# Patient Record
Sex: Male | Born: 1955
Health system: Southern US, Community
[De-identification: ages and names within clinical notes are randomized; demographics above are authoritative.]

## PROBLEM LIST (undated history)

## (undated) DIAGNOSIS — R569 Unspecified convulsions: Secondary | ICD-10-CM

## (undated) DIAGNOSIS — Z95810 Presence of automatic (implantable) cardiac defibrillator: Secondary | ICD-10-CM

## (undated) DIAGNOSIS — M5090 Cervical disc disorder, unspecified, unspecified cervical region: Secondary | ICD-10-CM

## (undated) DIAGNOSIS — Z91010 Allergy to peanuts: Secondary | ICD-10-CM

## (undated) DIAGNOSIS — Z8719 Personal history of other diseases of the digestive system: Secondary | ICD-10-CM

## (undated) DIAGNOSIS — F329 Major depressive disorder, single episode, unspecified: Secondary | ICD-10-CM

## (undated) DIAGNOSIS — I499 Cardiac arrhythmia, unspecified: Secondary | ICD-10-CM

## (undated) DIAGNOSIS — I219 Acute myocardial infarction, unspecified: Secondary | ICD-10-CM

## (undated) DIAGNOSIS — I10 Essential (primary) hypertension: Secondary | ICD-10-CM

## (undated) DIAGNOSIS — R0600 Dyspnea, unspecified: Secondary | ICD-10-CM

## (undated) DIAGNOSIS — E119 Type 2 diabetes mellitus without complications: Secondary | ICD-10-CM

## (undated) DIAGNOSIS — F1910 Other psychoactive substance abuse, uncomplicated: Secondary | ICD-10-CM

## (undated) DIAGNOSIS — J438 Other emphysema: Secondary | ICD-10-CM

## (undated) DIAGNOSIS — H905 Unspecified sensorineural hearing loss: Secondary | ICD-10-CM

## (undated) DIAGNOSIS — I5042 Chronic combined systolic (congestive) and diastolic (congestive) heart failure: Secondary | ICD-10-CM

## (undated) DIAGNOSIS — F32A Depression, unspecified: Secondary | ICD-10-CM

## (undated) DIAGNOSIS — F419 Anxiety disorder, unspecified: Secondary | ICD-10-CM

## (undated) DIAGNOSIS — J479 Bronchiectasis, uncomplicated: Secondary | ICD-10-CM

## (undated) DIAGNOSIS — Z860101 Personal history of adenomatous and serrated colon polyps: Secondary | ICD-10-CM

## (undated) DIAGNOSIS — R7303 Prediabetes: Secondary | ICD-10-CM

## (undated) DIAGNOSIS — E854 Organ-limited amyloidosis: Secondary | ICD-10-CM

## (undated) DIAGNOSIS — E785 Hyperlipidemia, unspecified: Secondary | ICD-10-CM

## (undated) DIAGNOSIS — R7989 Other specified abnormal findings of blood chemistry: Secondary | ICD-10-CM

## (undated) DIAGNOSIS — J189 Pneumonia, unspecified organism: Secondary | ICD-10-CM

## (undated) DIAGNOSIS — I119 Hypertensive heart disease without heart failure: Secondary | ICD-10-CM

## (undated) DIAGNOSIS — D689 Coagulation defect, unspecified: Secondary | ICD-10-CM

## (undated) DIAGNOSIS — I2510 Atherosclerotic heart disease of native coronary artery without angina pectoris: Secondary | ICD-10-CM

## (undated) DIAGNOSIS — M160 Bilateral primary osteoarthritis of hip: Secondary | ICD-10-CM

## (undated) DIAGNOSIS — R739 Hyperglycemia, unspecified: Secondary | ICD-10-CM

## (undated) DIAGNOSIS — I428 Other cardiomyopathies: Secondary | ICD-10-CM

## (undated) DIAGNOSIS — Z86010 Personal history of colonic polyps: Secondary | ICD-10-CM

## (undated) HISTORY — DX: Personal history of adenomatous and serrated colon polyps: Z86.0101

## (undated) HISTORY — DX: Organ-limited amyloidosis: E85.4

## (undated) HISTORY — DX: Hyperglycemia, unspecified: R73.9

## (undated) HISTORY — DX: Bronchiectasis, uncomplicated: J47.9

## (undated) HISTORY — DX: Hyperlipidemia, unspecified: E78.5

## (undated) HISTORY — DX: Presence of automatic (implantable) cardiac defibrillator: Z95.810

## (undated) HISTORY — PX: OTHER SURGICAL HISTORY: SHX169

## (undated) HISTORY — DX: Bilateral primary osteoarthritis of hip: M16.0

## (undated) HISTORY — DX: Other emphysema: J43.8

## (undated) HISTORY — DX: Personal history of colon polyps: Z86.010

## (undated) HISTORY — DX: Coagulation defect, unspecified: D68.9

## (undated) HISTORY — DX: Personal history of other diseases of the digestive system: Z87.19

## (undated) HISTORY — DX: Allergy to peanuts: Z91.010

## (undated) HISTORY — DX: Atherosclerotic heart disease of native coronary artery without angina pectoris: I25.10

## (undated) HISTORY — DX: Unspecified sensorineural hearing loss: H90.5

## (undated) HISTORY — PX: COLONOSCOPY: SHX174

---

## 2002-03-18 ENCOUNTER — Emergency Department (HOSPITAL_COMMUNITY): Admission: EM | Admit: 2002-03-18 | Discharge: 2002-03-18 | Payer: Self-pay | Admitting: Emergency Medicine

## 2011-05-11 ENCOUNTER — Emergency Department (HOSPITAL_COMMUNITY): Payer: Self-pay

## 2011-05-11 ENCOUNTER — Emergency Department (HOSPITAL_COMMUNITY)
Admission: EM | Admit: 2011-05-11 | Discharge: 2011-05-11 | Disposition: A | Discharge status: Home / Self Care | Payer: Self-pay | Attending: Emergency Medicine | Admitting: Emergency Medicine

## 2011-05-11 DIAGNOSIS — R51 Headache: Secondary | ICD-10-CM | POA: Insufficient documentation

## 2011-05-11 DIAGNOSIS — M542 Cervicalgia: Secondary | ICD-10-CM | POA: Insufficient documentation

## 2011-05-11 DIAGNOSIS — M546 Pain in thoracic spine: Secondary | ICD-10-CM | POA: Insufficient documentation

## 2011-05-11 DIAGNOSIS — R9431 Abnormal electrocardiogram [ECG] [EKG]: Secondary | ICD-10-CM

## 2011-05-11 DIAGNOSIS — S2691XA Contusion of heart, unspecified with or without hemopericardium, initial encounter: Secondary | ICD-10-CM | POA: Insufficient documentation

## 2011-05-11 DIAGNOSIS — I10 Essential (primary) hypertension: Secondary | ICD-10-CM | POA: Insufficient documentation

## 2011-05-11 DIAGNOSIS — IMO0001 Reserved for inherently not codable concepts without codable children: Secondary | ICD-10-CM | POA: Insufficient documentation

## 2011-05-11 DIAGNOSIS — R209 Unspecified disturbances of skin sensation: Secondary | ICD-10-CM | POA: Insufficient documentation

## 2011-05-11 DIAGNOSIS — M25519 Pain in unspecified shoulder: Secondary | ICD-10-CM | POA: Insufficient documentation

## 2011-05-11 DIAGNOSIS — R079 Chest pain, unspecified: Secondary | ICD-10-CM | POA: Insufficient documentation

## 2011-05-11 LAB — POCT I-STAT, CHEM 8
Chloride: 101 mEq/L (ref 96–112)
HCT: 44 % (ref 39.0–52.0)
Hemoglobin: 15 g/dL (ref 13.0–17.0)
Potassium: 3.5 mEq/L (ref 3.5–5.1)
Sodium: 136 mEq/L (ref 135–145)

## 2011-05-11 LAB — POCT I-STAT TROPONIN I
Troponin i, poc: 0.01 ng/mL (ref 0.00–0.08)
Troponin i, poc: 0.02 ng/mL (ref 0.00–0.08)
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
## 2012-10-22 ENCOUNTER — Emergency Department (HOSPITAL_COMMUNITY): Payer: Self-pay

## 2012-10-22 ENCOUNTER — Encounter (HOSPITAL_COMMUNITY): Payer: Self-pay | Admitting: *Deleted

## 2012-10-22 ENCOUNTER — Emergency Department (HOSPITAL_COMMUNITY)
Admission: EM | Admit: 2012-10-22 | Discharge: 2012-10-22 | Disposition: A | Discharge status: Home / Self Care | Payer: Self-pay | Attending: Emergency Medicine | Admitting: Emergency Medicine

## 2012-10-22 DIAGNOSIS — R51 Headache: Secondary | ICD-10-CM | POA: Insufficient documentation

## 2012-10-22 DIAGNOSIS — R071 Chest pain on breathing: Secondary | ICD-10-CM | POA: Insufficient documentation

## 2012-10-22 DIAGNOSIS — I10 Essential (primary) hypertension: Secondary | ICD-10-CM | POA: Insufficient documentation

## 2012-10-22 DIAGNOSIS — R1110 Vomiting, unspecified: Secondary | ICD-10-CM | POA: Insufficient documentation

## 2012-10-22 DIAGNOSIS — Z79899 Other long term (current) drug therapy: Secondary | ICD-10-CM | POA: Insufficient documentation

## 2012-10-22 DIAGNOSIS — R0789 Other chest pain: Secondary | ICD-10-CM

## 2012-10-22 HISTORY — DX: Essential (primary) hypertension: I10

## 2012-10-22 LAB — CBC WITH DIFFERENTIAL/PLATELET
Basophils Relative: 0 % (ref 0–1)
Eosinophils Absolute: 0.1 10*3/uL (ref 0.0–0.7)
Eosinophils Relative: 1 % (ref 0–5)
Lymphs Abs: 1.2 10*3/uL (ref 0.7–4.0)
MCH: 28.6 pg (ref 26.0–34.0)
MCHC: 34.2 g/dL (ref 30.0–36.0)
MCV: 83.7 fL (ref 78.0–100.0)
Neutrophils Relative %: 75 % (ref 43–77)
Platelets: 162 10*3/uL (ref 150–400)

## 2012-10-22 LAB — COMPREHENSIVE METABOLIC PANEL
ALT: 21 U/L (ref 0–53)
Albumin: 3.2 g/dL — ABNORMAL LOW (ref 3.5–5.2)
Alkaline Phosphatase: 53 U/L (ref 39–117)
BUN: 12 mg/dL (ref 6–23)
Calcium: 8.5 mg/dL (ref 8.4–10.5)
GFR calc Af Amer: >90 mL/min (ref >90)
Glucose, Bld: 108 mg/dL — ABNORMAL HIGH (ref 70–99)
Potassium: 3.5 mEq/L (ref 3.5–5.1)
Sodium: 133 mEq/L — ABNORMAL LOW (ref 135–145)
Total Protein: 6.2 g/dL (ref 6.0–8.3)

## 2012-10-22 MED ORDER — DIPHENHYDRAMINE HCL 50 MG/ML IJ SOLN
25.0000 mg | Freq: Once | INTRAMUSCULAR | Status: AC
  Administered 2012-10-22: 25 mg via INTRAVENOUS
  Filled 2012-10-22: qty 1

## 2012-10-22 MED ORDER — DIAZEPAM 5 MG/ML IJ SOLN
5.0000 mg | Freq: Once | INTRAMUSCULAR | Status: AC
  Administered 2012-10-22: 5 mg via INTRAVENOUS
  Filled 2012-10-22: qty 2

## 2012-10-22 MED ORDER — METOCLOPRAMIDE HCL 5 MG/ML IJ SOLN
10.0000 mg | Freq: Once | INTRAMUSCULAR | Status: AC
  Administered 2012-10-22: 10 mg via INTRAVENOUS
  Filled 2012-10-22: qty 2

## 2012-10-22 MED ORDER — SODIUM CHLORIDE 0.9 % IV SOLN: Freq: Once | INTRAVENOUS | Status: DC

## 2012-10-22 MED ORDER — ASPIRIN 81 MG PO CHEW
324.0000 mg | CHEWABLE_TABLET | Freq: Once | ORAL | Status: AC
  Administered 2012-10-22: 324 mg via ORAL
  Filled 2012-10-22: qty 4

## 2012-10-22 NOTE — ED Notes (Signed)
Equal grips bilaterally; equal facial grimacing; no arm drift; no numbness

## 2012-10-22 NOTE — ED Provider Notes (Signed)
History     CSN: [id number]  Arrival date & time 10/22/12  0103   First MD Initiated Contact with Patient 10/22/12 0122      Chief Complaint  Patient presents with  . Chest Pain  . Headache    (Consider location/radiation/quality/duration/timing/severity/associated sxs/prior treatment) HPI 57 year old male presents to emergency room complaining of 3 days of frontal headache, elevated blood pressures and onset of right-sided chest pain this evening. Patient reports pain in his head came on gradually after a night shift. He is tried Alka-Seltzer plus and ibuprofen without improvement in symptoms. He has been able to sleep secondary to pain. He had vomiting just prior to arrival here. He denies any focal weakness numbness or loss of function. Patient has history of migraines, but reports this headache is not like his migraines. He denies any photo or phonophobia associated with this headache. Patient is very tender to palpation over her front of his head. Patient has history of hypertension, has been off his medications for at least a year. He actually has an appointment with his primary care Dr. this Friday to restart his medications. Patient reports his sister had a stroke, but denies any other significant family history. Chest pain started just prior to arrival. It is right-sided. He denies any diaphoresis, shortness of breath. Pain is worse with palpation to the right chest. No fever no cough.  Past Medical History  Diagnosis Date  . Hypertension     History reviewed. No pertinent past surgical history.  No family history on file.  History  Substance Use Topics  . Smoking status: Never Smoker   . Smokeless tobacco: Not on file  . Alcohol Use: No      Review of Systems  See History of Present Illness; otherwise all other systems are reviewed and negative  Allergies  Penicillins  Home Medications   Current Outpatient Rx  Name  Route  Sig  Dispense  Refill  . IBUPROFEN  200 MG PO TABS   Oral   Take 200 mg by mouth every 6 (six) hours as needed. Pain         . PHENYLEPH-DOXYLAMINE-DM-APAP 5-6.25-10-325 MG PO CAPS   Oral   Take 1 tablet by mouth every 6 (six) hours as needed. Cold symptoms           BP 148/84  Pulse 86  Temp 97.8 F (36.6 C)  Resp 20  SpO2 99%  Physical Exam  Nursing note and vitals reviewed. Constitutional: He is oriented to person, place, and time. He appears well-developed and well-nourished.  HENT:  Head: Normocephalic and atraumatic.  Right Ear: External ear normal.  Left Ear: External ear normal.  Nose: Nose normal.  Mouth/Throat: Oropharynx is clear and moist.       Patient is exquisitely tender with light and deep palpation of the for head and frontal scalp  Eyes: Conjunctivae normal and EOM are normal. Pupils are equal, round, and reactive to light.  Neck: Normal range of motion. Neck supple. No JVD present. No tracheal deviation present. No thyromegaly present.  Cardiovascular: Normal rate, regular rhythm, normal heart sounds and intact distal pulses.  Exam reveals no gallop and no friction rub.   No murmur heard. Pulmonary/Chest: Effort normal and breath sounds normal. No stridor. No respiratory distress. He has no wheezes. He has no rales. He exhibits tenderness (patient is tender over the right pectoral muscle. Palpation of this area reproduces his chest pain).  Abdominal: Soft. Bowel sounds are normal. He  exhibits no distension and no mass. There is no tenderness. There is no rebound and no guarding.  Musculoskeletal: Normal range of motion. He exhibits no edema and no tenderness.  Lymphadenopathy:    He has no cervical adenopathy.  Neurological: He is alert and oriented to person, place, and time. He has normal reflexes. No cranial nerve deficit. He exhibits normal muscle tone. Coordination normal.  Skin: Skin is warm and dry. No rash noted. No erythema. No pallor.  Psychiatric: He has a normal mood and affect.  His behavior is normal. Judgment and thought content normal.    ED Course  Procedures (including critical care time)  Labs Reviewed  COMPREHENSIVE METABOLIC PANEL - Abnormal; Notable for the following:    Sodium 133 (*)     Glucose, Bld 108 (*)     Albumin 3.2 (*)     Total Bilirubin 0.2 (*)     All other components within normal limits  TROPONIN I  CBC WITH DIFFERENTIAL   Dg Chest 2 View  10/22/2012  *RADIOLOGY REPORT*  Clinical Data: Chest pain and headache.  CHEST - 2 VIEW  Comparison: Chest radiograph performed 05/11/2011  Findings: The lungs are hypoexpanded.  Mild bibasilar airspace opacities may reflect atelectasis or possibly pneumonia.  There is no evidence of pleural effusion or pneumothorax.  The heart is mildly enlarged.  No acute osseous abnormalities are seen.  IMPRESSION: Mild bibasilar airspace opacities may reflect atelectasis or possibly pneumonia.  Mild cardiomegaly noted.   Original Report Authenticated By: Tonia Ghent, M.D.     Date: 10/22/2012  Rate: 78  Rhythm: normal sinus rhythm  QRS Axis: normal  Intervals: normal  ST/T Wave abnormalities: nonspecific T wave changes and early repolarization  Conduction Disutrbances:none  Narrative Interpretation: T. wave inversions laterally are new from prior  Old EKG Reviewed: changes noted    1. Headache   2. Chest wall pain   3. Hypertension       MDM   57 year old male with 3 days of headache, now with chest pain. Headache appears to be tension in nature. His narrow exam is negative. Headache was not thunderclap in nature. I do not feel that he has a subarachnoid or meningitis. Chest pain seems atypical, and musculoskeletal. He does have new changes on his EKG from prior EKG from 2 years ago. Will check baseline labs, head CT and chest x-ray and will treat pain.  Pt with complete resolution of headache, improved bp.  Labs unremarkable, has close f/u with pcm.       Olivia Mackie, MD 10/22/12 [phone]

## 2012-10-22 NOTE — ED Notes (Signed)
Pt c/o vomiting/headache x 3 days; chest pain x 1 hour; headache constant x 3 days

## 2013-08-10 ENCOUNTER — Emergency Department (INDEPENDENT_AMBULATORY_CARE_PROVIDER_SITE_OTHER)
Admission: EM | Admit: 2013-08-10 | Discharge: 2013-08-10 | Disposition: A | Discharge status: Home / Self Care | Payer: Self-pay | Source: Home / Self Care

## 2013-08-10 ENCOUNTER — Encounter (HOSPITAL_COMMUNITY): Payer: Self-pay | Admitting: Emergency Medicine

## 2013-08-10 DIAGNOSIS — H109 Unspecified conjunctivitis: Secondary | ICD-10-CM

## 2013-08-10 MED ORDER — KETOTIFEN FUMARATE 0.025 % OP SOLN: 1.0000 [drp] | Freq: Two times a day (BID) | OPHTHALMIC | Status: DC

## 2013-08-10 MED ORDER — POLYMYXIN B-TRIMETHOPRIM 10000-0.1 UNIT/ML-% OP SOLN: 1.0000 [drp] | OPHTHALMIC | Status: DC

## 2013-08-10 NOTE — ED Provider Notes (Signed)
CSN: [id number]     Arrival date & time 08/10/13  1437 History   First MD Initiated Contact with Patient 08/10/13 1556     Chief Complaint  Patient presents with  . Eye Problem   (Consider location/radiation/quality/duration/timing/severity/associated sxs/prior Treatment) HPI Comments: C/O mild red and itchy eyes for several days. 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 St only occassional clear tearing.    Past Medical History  Diagnosis Date  . Hypertension    History reviewed. No pertinent past surgical history. No family history on file. History  Substance Use Topics  . Smoking status: Never Smoker   . Smokeless tobacco: Not on file  . Alcohol Use: No    Review of Systems  Eyes: Positive for redness and itching. Negative for photophobia, pain and visual disturbance.  All other systems reviewed and are negative.    Allergies  Penicillins  Home Medications   Current Outpatient Rx  Name  Route  Sig  Dispense  Refill  . ibuprofen (ADVIL,MOTRIN) 200 MG tablet   Oral   Take 200 mg by mouth every 6 (six) hours as needed. Pain         . Phenyleph-Doxylamine-DM-APAP (ALKA-SELTZER PLS NIGHT CLD/FLU) 5-6.25-10-325 MG CAPS   Oral   Take 1 tablet by mouth every 6 (six) hours as needed. Cold symptoms          BP 108/78  Pulse 101  Temp(Src) 98.1 F (36.7 C) (Oral)  Resp 16 Physical Exam  Nursing note and vitals reviewed. Constitutional: He is oriented to person, place, and time. He appears well-developed and well-nourished. No distress.  Eyes: EOM are normal. Pupils are equal, round, and reactive to light.  Minor bilateral conjunctival erythema, no swelling/edema. No current drainage. Minimal scleral redness.   Neurological: He is alert and oriented to person, place, and time. He exhibits normal muscle tone.  Skin: Skin is warm and dry.  Psychiatric: He has a normal mood and affect.    ED Course  Procedures (including critical care time) Labs Review Labs  Reviewed - No data to display Imaging Review No results found.      MDM   1. Conjunctivitis of both eyes      polytrim as directed and Zaditor  Hayden Rasmussen, NP 08/10/13 1623

## 2013-08-10 NOTE — ED Notes (Signed)
Reports left eye red and work place requesting a note concerning dignosis of left eye.  Both eyes appear red.  Patient reports eyes itching and watery.  Patient works 2 jobs.

## 2013-08-12 NOTE — ED Provider Notes (Signed)
Medical screening examination/treatment/procedure(s) were performed by resident physician or non-physician practitioner and as supervising physician I was immediately available for consultation/collaboration.   KINDL,JAMES DOUGLAS MD.   James D Kindl, MD 08/12/13 1918 

## 2014-09-27 ENCOUNTER — Encounter: Payer: Self-pay | Admitting: Internal Medicine

## 2014-09-27 ENCOUNTER — Ambulatory Visit: Payer: No Typology Code available for payment source | Attending: Internal Medicine | Admitting: Internal Medicine

## 2014-09-27 VITALS — BP 129/82 | HR 94 | Temp 97.7°F | Resp 16 | Ht 66.0 in | Wt 139.0 lb

## 2014-09-27 DIAGNOSIS — Z79899 Other long term (current) drug therapy: Secondary | ICD-10-CM | POA: Insufficient documentation

## 2014-09-27 DIAGNOSIS — M79604 Pain in right leg: Secondary | ICD-10-CM

## 2014-09-27 DIAGNOSIS — M79651 Pain in right thigh: Secondary | ICD-10-CM | POA: Diagnosis not present

## 2014-09-27 DIAGNOSIS — Z0000 Encounter for general adult medical examination without abnormal findings: Secondary | ICD-10-CM | POA: Diagnosis present

## 2014-09-27 DIAGNOSIS — Z1211 Encounter for screening for malignant neoplasm of colon: Secondary | ICD-10-CM

## 2014-09-27 DIAGNOSIS — B070 Plantar wart: Secondary | ICD-10-CM | POA: Insufficient documentation

## 2014-09-27 DIAGNOSIS — I10 Essential (primary) hypertension: Secondary | ICD-10-CM | POA: Diagnosis not present

## 2014-09-27 MED ORDER — TRAMADOL HCL 50 MG PO TABS: 50.0000 mg | ORAL_TABLET | Freq: Two times a day (BID) | ORAL | Status: DC | PRN

## 2014-09-27 MED ORDER — MELOXICAM 7.5 MG PO TABS: 7.5000 mg | ORAL_TABLET | Freq: Every day | ORAL | Status: DC

## 2014-09-27 NOTE — Progress Notes (Signed)
Patient ID: Jonathan Dickson, male   DOB: December 30, 1955, 58 y.o.   MRN: [id number]   IWP:[id number]  NKN:[id number]  DOB - 16-Feb-1956  CC:  Chief Complaint  Patient presents with  . Establish Care       HPI: Jonathan Dickson is a 58 y.o. male here today to establish medical care.  Patient has a past medical history of hypertension.  Patient is currently on lisinopril 20 mg daily.  He reports that he has been having severe right thigh pain for the past one year.  He reports that the pain in only located on the lateral middle thigh and feels like a sharp knife stabbing.  He denies any injuries to the leg. Denies back pain.  He has tried tylenol, BC powder, and OTC pain aides.  Was previously being seen by Jinny Blossom for chronic disease management.   Patient reports that he had a Therapist, sports from his insurance company to come out and look at his feet and he was told that he had bilateral plantar warts.  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  Need colonoscopy.     Patient has No headache, No chest pain, No abdominal pain - No Nausea, No new weakness tingling or numbness, No Cough - SOB.  Allergies  Allergen Reactions  . Peanut-Containing Drug Products   . Penicillins    Past Medical History  Diagnosis Date  . Hypertension    Current Outpatient Prescriptions on File Prior to Visit  Medication Sig Dispense Refill  . ibuprofen (ADVIL,MOTRIN) 200 MG tablet Take 200 mg by mouth every 6 (six) hours as needed. Pain    . ketotifen (ZADITOR) 0.025 % ophthalmic solution Place 1 drop into both eyes 2 (two) times daily. (Patient not taking: Reported on 09/27/2014) 5 mL 0  . Phenyleph-Doxylamine-DM-APAP (ALKA-SELTZER PLS NIGHT CLD/FLU) 5-6.25-10-325 MG CAPS Take 1 tablet by mouth every 6 (six) hours as needed. Cold symptoms    . trimethoprim-polymyxin b (POLYTRIM) ophthalmic solution Place 1 drop into both eyes every 4 (four) hours. (Patient not taking: Reported on 09/27/2014) 10 mL 0   No current  facility-administered medications on file prior to visit.   History reviewed. No pertinent family history. History   Social History  . Marital Status: Legally Separated    Spouse Name: N/A    Number of Children: N/A  . Years of Education: N/A   Occupational History  . Not on file.   Social History Main Topics  . Smoking status: Never Smoker   . Smokeless tobacco: Not on file  . Alcohol Use: No  . Drug Use: Not on file  . Sexual Activity: Not on file   Other Topics Concern  . Not on file   Social History Narrative    Review of Systems: See HPI    Objective:   Filed Vitals:   09/27/14 1613  BP: 129/82  Pulse: 94  Temp: 97.7 F (36.5 C)  Resp: 16    Physical Exam: Constitutional: Patient appears well-developed and well-nourished. No distress. Eyes: Conjunctivae and EOM are normal. PERRLA, no scleral icterus. Neck: Normal ROM. Neck supple. No JVD. No tracheal deviation. No thyromegaly. CVS: RRR, S1/S2 +, no murmurs, no gallops, no carotid bruit.  Pulmonary: Effort and breath sounds normal, no stridor, rhonchi, wheezes, rales.  Abdominal: Soft. BS +, no distension, tenderness, rebound or guarding.  Musculoskeletal: Normal range of motion. No edema and no tenderness.  Lymphadenopathy: No lymphadenopathy noted, cervical Neuro: Alert.  Skin: Skin is warm and dry. No rash noted. Not diaphoretic. No erythema. No pallor. Small callus areas on bilateral ball of feet Psychiatric: Normal mood and affect. Behavior, judgment, thought content normal.  Lab Results  Component Value Date   WBC 6.3 10/22/2012   HGB 13.7 10/22/2012   HCT 40.1 10/22/2012   MCV 83.7 10/22/2012   PLT 162 10/22/2012   Lab Results  Component Value Date   CREATININE 0.67 10/22/2012   BUN 12 10/22/2012   NA 133* 10/22/2012   K 3.5 10/22/2012   CL 99 10/22/2012   CO2 24 10/22/2012    No results found for: HGBA1C Lipid Panel  No results found for: CHOL, TRIG, HDL, CHOLHDL, VLDL,  LDLCALC     Assessment and plan:   Jonathan Dickson was seen today for establish care.  Diagnoses and associated orders for this visit:  Essential hypertension - CBC; Future - COMPLETE METABOLIC PANEL WITH GFR; Future - Lipid panel; Future Patient blood pressure is stable and may continue on current medication.  Education on diet, exercise, and modifiable risk factors discussed. Will obtain appropriate labs as needed. Will follow up in 3-6 months.   Right leg pain - meloxicam (MOBIC) 7.5 MG tablet; Take 1 tablet (7.5 mg total) by mouth daily. - traMADol (ULTRAM) 50 MG tablet; Take 1 tablet (50 mg total) by mouth every 12 (twelve) hours as needed.  Plantar warts - Ambulatory referral to Podiatry  Colon cancer screening - [facility] COLONOSCOPY   Return in about 1 week (around 10/04/2014) for Lab Visit and 3 mo PCP HTN.      Chari Manning, [facility] [phone] 09/27/2014, 4:34 PM

## 2014-09-27 NOTE — Progress Notes (Signed)
Pt is here to establish care. Pt has a history of HTN. Pt states that for a year he has severe pain in his right thigh. Pt reports having plantar warts on b/l feet.

## 2014-09-27 NOTE — Patient Instructions (Signed)
Plantar Warts Warts are benign (noncancerous) growths of the outer skin layer. They can occur at any time in life but are most common during childhood and the teen years. Warts can occur on many skin surfaces of the body. When they occur on the underside (sole) of your foot they are called plantar warts. They often emerge in groups with several small warts encircling a larger growth. CAUSES  Human papillomavirus (HPV) is the cause of plantar warts. HPV attacks a break in the skin of the foot. Walking barefoot can lead to exposure to the wart virus. Plantar warts tend to develop over areas of pressure such as the heel and ball of the foot. Plantar warts often grow into the deeper layers of skin. They may spread to other areas of the sole but cannot spread to other areas of the body. SYMPTOMS  You may also notice a growth on the undersurface of your foot. The wart may grow directly into the sole of the foot, or rise above the surface of the skin on the sole of the foot, or both. They are most often flat from pressure. Warts generally do not cause itching but may cause pain in the area of the wart when you put weight on your foot. DIAGNOSIS  Diagnosis is made by physical examination. This means your caregiver discovers it while examining your foot.  TREATMENT  There are many ways to treat plantar warts. However, warts are very tough. Sometimes it is difficult to treat them so that they go away completely and do not grow back. Any treatment must be done regularly to work. If left untreated, most plantar warts will eventually disappear over a period of one to two years. Treatments you can do at home include:  Putting duct tape over the top of the wart (occlusion) has been found to be effective over several months. The duct tape should be removed each night and reapplied until the wart has disappeared.  Placing over-the-counter medications on top of the wart to help kill the wart virus and remove the wart  tissue (salicylic acid, cantharidin, and dichloroacetic acid) are useful. These are called keratolytic agents. These medications make the skin soft and gradually layers will shed away. These compounds are usually placed on the wart each night and then covered with a bandage. They are also available in premedicated bandage form. Avoid surrounding skin when applying these liquids as these medications can burn healthy skin. The treatment may take several months of nightly use to be effective.  Cryotherapy to freeze the wart has recently become available over-the-counter for children 4 years and older. This system makes use of a soft narrow applicator connected to a bottle of compressed cold liquid that is applied directly to the wart. This medication can burn healthy skin and should be used with caution.  As with all over-the-counter medications, read the directions carefully before use. Treatments generally done in your caregiver's office include:  Some aggressive treatments may cause discomfort, discoloration, and scarring of the surrounding skin. The risks and benefits of treatment should be discussed with your caregiver.  Freezing the wart with liquid nitrogen (cryotherapy, see above).  Burning the wart with use of very high heat (cautery).  Injecting medication into the wart.  Surgically removing or laser treatment of the wart.  Your caregiver may refer you to a dermatologist for difficult to treat large-sized warts or large numbers of warts. HOME CARE INSTRUCTIONS   Soak the affected area in warm water. Dry the   area completely when you are done. Remove the top layer of softened skin, then apply the chosen topical medication and reapply a bandage.  Remove the bandage daily and file excess wart tissue (pumice stone works well for this purpose). Repeat the entire process daily or every other day for weeks until the plantar wart disappears.  Several brands of salicylic acid pads are available  as over-the-counter remedies.  Pain can be relieved by wearing a donut bandage. This is a bandage with a hole in it. The bandage is put on with the hole over the wart. This helps take the pressure off the wart and gives pain relief. To help prevent plantar warts:  Wear shoes and socks and change them daily.  Keep feet clean and dry.  Check your feet and your children's feet regularly.  Avoid direct contact with warts on other people.  Have growths or changes on your skin checked by your caregiver. Document Released: 12/07/2003 Document Revised: 01/31/2014 Document Reviewed: 05/17/2009 Exit[employer] Patient Information 2015 [employer]. This information is not intended to replace advice given to you by your health care provider. Make sure you discuss any questions you have with your health care provider.  

## 2014-10-04 ENCOUNTER — Ambulatory Visit: Payer: No Typology Code available for payment source | Attending: Internal Medicine

## 2014-10-04 DIAGNOSIS — I10 Essential (primary) hypertension: Secondary | ICD-10-CM

## 2014-10-04 LAB — COMPLETE METABOLIC PANEL WITH GFR
ALT: 16 U/L (ref 0–53)
AST: 24 U/L (ref 0–37)
Albumin: 4.2 g/dL (ref 3.5–5.2)
Alkaline Phosphatase: 51 U/L (ref 39–117)
BUN: 18 mg/dL (ref 6–23)
CO2: 27 mEq/L (ref 19–32)
Calcium: 9.8 mg/dL (ref 8.4–10.5)
Chloride: 101 mEq/L (ref 96–112)
Creat: 0.91 mg/dL (ref 0.50–1.35)
GFR, Est African American: >89 mL/min
GFR, Est Non African American: >89 mL/min
Glucose, Bld: 90 mg/dL (ref 70–99)
Potassium: 4.2 mEq/L (ref 3.5–5.3)
Sodium: 136 mEq/L (ref 135–145)
Total Bilirubin: 0.5 mg/dL (ref 0.2–1.2)
Total Protein: 6.8 g/dL (ref 6.0–8.3)

## 2014-10-04 LAB — LIPID PANEL
Cholesterol: 155 mg/dL (ref 0–200)
HDL: 54 mg/dL (ref >39)
LDL Cholesterol: 84 mg/dL (ref 0–99)
Total CHOL/HDL Ratio: 2.9 Ratio
Triglycerides: 87 mg/dL (ref <150)
VLDL: 17 mg/dL (ref 0–40)

## 2014-10-04 LAB — CBC
HCT: 43.2 % (ref 39.0–52.0)
Hemoglobin: 15 g/dL (ref 13.0–17.0)
MCH: 28.5 pg (ref 26.0–34.0)
MCHC: 34.7 g/dL (ref 30.0–36.0)
MCV: 82 fL (ref 78.0–100.0)
MPV: 11.2 fL (ref 8.6–12.4)
Platelets: 224 10*3/uL (ref 150–400)
RBC: 5.27 MIL/uL (ref 4.22–5.81)
RDW: 13.5 % (ref 11.5–15.5)
WBC: 6.2 10*3/uL (ref 4.0–10.5)

## 2014-10-10 ENCOUNTER — Telehealth: Payer: Self-pay | Admitting: *Deleted

## 2014-10-10 NOTE — Telephone Encounter (Signed)
Pt aware of lab results Stated eat peanut and lips swelling, took benadryl this morning and lips still swelling Advised to go to ER if short of breath or Sx worsen Come to walking clinic tomorrow form [facility] if no changes

## 2014-10-10 NOTE — Telephone Encounter (Signed)
-----   Message from Lance Bosch, NP sent at 10/07/2014  9:20 AM EST ----- All labs are normal

## 2014-10-11 ENCOUNTER — Telehealth: Payer: Self-pay | Admitting: *Deleted

## 2014-10-11 NOTE — Telephone Encounter (Signed)
Called patient to check how he is feeling Stated swelling gone down and still taking Benadryl.

## 2014-10-20 ENCOUNTER — Ambulatory Visit: Payer: No Typology Code available for payment source | Admitting: Podiatry

## 2014-10-28 ENCOUNTER — Other Ambulatory Visit: Payer: Self-pay | Admitting: Internal Medicine

## 2014-11-01 ENCOUNTER — Ambulatory Visit: Payer: No Typology Code available for payment source | Admitting: Podiatry

## 2014-11-15 ENCOUNTER — Ambulatory Visit (INDEPENDENT_AMBULATORY_CARE_PROVIDER_SITE_OTHER): Payer: No Typology Code available for payment source | Admitting: Podiatry

## 2014-11-15 VITALS — BP 109/9 | HR 99 | Resp 16 | Ht 66.0 in | Wt 142.0 lb

## 2014-11-15 DIAGNOSIS — Q828 Other specified congenital malformations of skin: Secondary | ICD-10-CM

## 2014-11-15 NOTE — Progress Notes (Signed)
   Subjective:    Patient ID: Jonathan Dickson, male    DOB: 02/27/1956, 59 y.o.   MRN: [id number]  HPI Comments: Both feet callused lesions, been there for years, no treatment, picked at them with fingernail      Review of Systems  All other systems reviewed and are negative.      Objective:   Physical Exam: I have reviewed his past mental history medications allergies surgery social history and review of systems. Pulses are strongly palpable bilateral. Neurologic sensorium is intact percent was C monofilament. Deep tendon reflexes are intact bilaterally muscle strength is 5 over 5 dorsiflexion plantar flexors and inverters everters all intrinsic musculature is intact. With a peak evaluation of Mr. it's all joints distal to the ankle for range of motion without crepitus. Solitary poor keratomas the plantar aspect of the bilateral foot total of 3 on the right foot 1 on the left foot.        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  Plan: Excision of soft tissue lesions today and I will follow-up with him as needed.

## 2015-01-27 ENCOUNTER — Encounter (HOSPITAL_COMMUNITY): Payer: Self-pay | Admitting: Emergency Medicine

## 2015-01-27 DIAGNOSIS — Z791 Long term (current) use of non-steroidal anti-inflammatories (NSAID): Secondary | ICD-10-CM | POA: Insufficient documentation

## 2015-01-27 DIAGNOSIS — M62838 Other muscle spasm: Secondary | ICD-10-CM | POA: Diagnosis not present

## 2015-01-27 DIAGNOSIS — M5410 Radiculopathy, site unspecified: Secondary | ICD-10-CM | POA: Insufficient documentation

## 2015-01-27 DIAGNOSIS — Z79899 Other long term (current) drug therapy: Secondary | ICD-10-CM | POA: Insufficient documentation

## 2015-01-27 DIAGNOSIS — M79601 Pain in right arm: Secondary | ICD-10-CM | POA: Diagnosis present

## 2015-01-27 DIAGNOSIS — Z880 Allergy status to penicillin: Secondary | ICD-10-CM | POA: Insufficient documentation

## 2015-01-27 DIAGNOSIS — I10 Essential (primary) hypertension: Secondary | ICD-10-CM | POA: Insufficient documentation

## 2015-01-27 DIAGNOSIS — Z87891 Personal history of nicotine dependence: Secondary | ICD-10-CM | POA: Insufficient documentation

## 2015-01-27 LAB — I-STAT TROPONIN, ED: TROPONIN I, POC: 0.01 ng/mL (ref 0.00–0.08)

## 2015-01-27 LAB — CBC
HEMATOCRIT: 41.3 % (ref 39.0–52.0)
Hemoglobin: 14.3 g/dL (ref 13.0–17.0)
MCH: 29.2 pg (ref 26.0–34.0)
MCHC: 34.6 g/dL (ref 30.0–36.0)
MCV: 84.3 fL (ref 78.0–100.0)
PLATELETS: 170 10*3/uL (ref 150–400)
RBC: 4.9 MIL/uL (ref 4.22–5.81)
RDW: 12.8 % (ref 11.5–15.5)
WBC: 5.5 10*3/uL (ref 4.0–10.5)

## 2015-01-27 NOTE — ED Notes (Signed)
Intermittently feeling of electricity going through R arm x 3 days and then followed by numbness to RUE.  Also reports intermittent R sided neck pain and pain to upper R back/shoulderblade.    Denies sob, nausea, vomiting, chest pain.  No neuro deficits noted on triage exam.

## 2015-01-28 ENCOUNTER — Emergency Department (HOSPITAL_COMMUNITY)
Admission: EM | Admit: 2015-01-28 | Discharge: 2015-01-28 | Disposition: A | Discharge status: Home / Self Care | Payer: No Typology Code available for payment source | Attending: Emergency Medicine | Admitting: Emergency Medicine

## 2015-01-28 DIAGNOSIS — M5410 Radiculopathy, site unspecified: Secondary | ICD-10-CM

## 2015-01-28 DIAGNOSIS — M79604 Pain in right leg: Secondary | ICD-10-CM

## 2015-01-28 DIAGNOSIS — M62838 Other muscle spasm: Secondary | ICD-10-CM

## 2015-01-28 LAB — BASIC METABOLIC PANEL
Anion gap: 8 (ref 5–15)
BUN: 16 mg/dL (ref 6–23)
CO2: 24 mmol/L (ref 19–32)
Calcium: 9.1 mg/dL (ref 8.4–10.5)
Chloride: 103 mmol/L (ref 96–112)
Creatinine, Ser: 1.02 mg/dL (ref 0.50–1.35)
GFR calc non Af Amer: 79 mL/min — ABNORMAL LOW (ref >90)
GLUCOSE: 100 mg/dL — AB (ref 70–99)
POTASSIUM: 4.4 mmol/L (ref 3.5–5.1)
Sodium: 135 mmol/L (ref 135–145)

## 2015-01-28 MED ORDER — METHOCARBAMOL 750 MG PO TABS: 750.0000 mg | ORAL_TABLET | Freq: Four times a day (QID) | ORAL | Status: DC

## 2015-01-28 MED ORDER — KETOROLAC TROMETHAMINE 30 MG/ML IJ SOLN: 60.0000 mg | Freq: Once | INTRAMUSCULAR | Status: DC

## 2015-01-28 MED ORDER — TRAMADOL HCL 50 MG PO TABS: 50.0000 mg | ORAL_TABLET | Freq: Four times a day (QID) | ORAL | Status: DC | PRN

## 2015-01-28 MED ORDER — KETOROLAC TROMETHAMINE 30 MG/ML IJ SOLN
60.0000 mg | Freq: Once | INTRAMUSCULAR | Status: AC
  Administered 2015-01-28: 60 mg via INTRAMUSCULAR
  Filled 2015-01-28: qty 2

## 2015-01-28 NOTE — Discharge Instructions (Signed)
Take medications as prescribed.  Continue with warm moist heat over the area.  Try to limit lifting with your right arm to under 10 pounds.  Follow-up with your doctor if not improving in one week.    Cervical Radiculopathy Cervical radiculopathy happens when a nerve in the neck is pinched or bruised by a slipped (herniated) disk or by arthritic changes in the bones of the cervical spine. This can occur due to an injury or as part of the normal aging process. Pressure on the cervical nerves can cause pain or numbness that runs from your neck all the way down into your arm and fingers. CAUSES  There are many possible causes, including:  Injury.  Muscle tightness in the neck from overuse.  Swollen, painful joints (arthritis).  Breakdown or degeneration in the bones and joints of the spine (spondylosis) due to aging.  Bone spurs that may develop near the cervical nerves. SYMPTOMS  Symptoms include pain, weakness, or numbness in the affected arm and hand. Pain can be severe or irritating. Symptoms may be worse when extending or turning the neck. DIAGNOSIS  Your caregiver will ask about your symptoms and do a physical exam. He or she may test your strength and reflexes. X-rays, CT scans, and MRI scans may be needed in cases of injury or if the symptoms do not go away after a period of time. Electromyography (EMG) or nerve conduction testing may be done to study how your nerves and muscles are working. TREATMENT  Your caregiver may recommend certain exercises to help relieve your symptoms. Cervical radiculopathy can, and often does, get better with time and treatment. If your problems continue, treatment options may include:  Wearing a soft collar for short periods of time.  Physical therapy to strengthen the neck muscles.  Medicines, such as nonsteroidal anti-inflammatory drugs (NSAIDs), oral corticosteroids, or spinal injections.  Surgery. Different types of surgery may be done depending  on the cause of your problems. HOME CARE INSTRUCTIONS   Put ice on the affected area.  Put ice in a plastic bag.  Place a towel between your skin and the bag.  Leave the ice on for 15-20 minutes, 03-04 times a day or as directed by your caregiver.  If ice does not help, you can try using heat. Take a warm shower or bath, or use a hot water bottle as directed by your caregiver.  You may try a gentle neck and shoulder massage.  Use a flat pillow when you sleep.  Only take over-the-counter or prescription medicines for pain, discomfort, or fever as directed by your caregiver.  If physical therapy was prescribed, follow your caregiver's directions.  If a soft collar was prescribed, use it as directed. SEEK IMMEDIATE MEDICAL CARE IF:   Your pain gets much worse and cannot be controlled with medicines.  You have weakness or numbness in your hand, arm, face, or leg.  You have a high fever or a stiff, rigid neck.  You lose bowel or bladder control (incontinence).  You have trouble with walking, balance, or speaking. MAKE SURE YOU:   Understand these instructions.  Will watch your condition.  Will get help right away if you are not doing well or get worse. Document Released: 06/11/2001 Document Revised: 12/09/2011 Document Reviewed: 04/30/2011 [facility] Patient Information 2015 [place], [place]. This information is not intended to replace advice given to you by your health care provider. Make sure you discuss any questions you have with your health care provider.  Muscle  Cramps and Spasms Muscle cramps and spasms occur when a muscle or muscles tighten and you have no control over this tightening (involuntary muscle contraction). They are a common problem and can develop in any muscle. The most common place is in the calf muscles of the leg. Both muscle cramps and muscle spasms are involuntary muscle contractions, but they also have differences:   Muscle cramps are sporadic and  painful. They may last a few seconds to a quarter of an hour. Muscle cramps are often more forceful and last longer than muscle spasms.  Muscle spasms may or may not be painful. They may also last just a few seconds or much longer. CAUSES  It is uncommon for cramps or spasms to be due to a serious underlying problem. In many cases, the cause of cramps or spasms is unknown. Some common causes are:   Overexertion.   Overuse from repetitive motions (doing the same thing over and over).   Remaining in a certain position for a long period of time.   Improper preparation, form, or technique while performing a sport or activity.   Dehydration.   Injury.   Side effects of some medicines.   Abnormally low levels of the salts and ions in your blood (electrolytes), especially potassium and calcium. This could happen if you are taking water pills (diuretics) or you are pregnant.  Some underlying medical problems can make it more likely to develop cramps or spasms. These include, but are not limited to:   Diabetes.   Parkinson disease.   Hormone disorders, such as thyroid problems.   Alcohol abuse.   Diseases specific to muscles, joints, and bones.   Blood vessel disease where not enough blood is getting to the muscles.  HOME CARE INSTRUCTIONS   Stay well hydrated. Drink enough water and fluids to keep your urine clear or pale yellow.  It may be helpful to massage, stretch, and relax the affected muscle.  For tight or tense muscles, use a warm towel, heating pad, or hot shower water directed to the affected area.  If you are sore or have pain after a cramp or spasm, applying ice to the affected area may relieve discomfort.  Put ice in a plastic bag.  Place a towel between your skin and the bag.  Leave the ice on for 15-20 minutes, 03-04 times a day.  Medicines used to treat a known cause of cramps or spasms may help reduce their frequency or severity. Only take  over-the-counter or prescription medicines as directed by your caregiver. SEEK MEDICAL CARE IF:  Your cramps or spasms get more severe, more frequent, or do not improve over time.  MAKE SURE YOU:   Understand these instructions.  Will watch your condition.  Will get help right away if you are not doing well or get worse. Document Released: 03/08/2002 Document Revised: 01/11/2013 Document Reviewed: 09/02/2012 [facility] Patient Information 2015 [place], [place]. This information is not intended to replace advice given to you by your health care provider. Make sure you discuss any questions you have with your health care provider.

## 2015-01-28 NOTE — ED Provider Notes (Signed)
CSN: [id number]     Arrival date & time 01/27/15  2228 History  This chart was scribe for Linton Flemings, MD by Judithann Sauger, ED Scribe. The patient was seen in room A03C/A03C and the patient's care was started at 12:31 AM.    Chief Complaint  Patient presents with  . Arm Pain   The history is provided by the patient. No language interpreter was used.   HPI Comments: KENDRA WOOLFORD is a 59 y.o. male who presents to the Emergency Department complaining of intermittent right arm shooting pain onset 3-4 days ago. He explains his shooting pain as a burning sensation with "electricity going through his arm". He reports associated numbness and tingling in his right arm and neck pain since onset. He denies CP, nausea, or vomiting. 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 He adds that he is left hand dominate. He reports an allergy to Penicillin.   PCP: Dr. Feliciana Rossetti   Past Medical History  Diagnosis Date  . Hypertension    History reviewed. No pertinent past surgical history. No family history on file. History  Substance Use Topics  . Smoking status: Former Research scientist (life sciences)  . Smokeless tobacco: Not on file  . Alcohol Use: No    Review of Systems  Constitutional: Negative for fever.  Cardiovascular: Negative for chest pain.  Gastrointestinal: Negative for nausea and vomiting.  Musculoskeletal: Positive for arthralgias and neck pain.  Neurological: Positive for numbness.      Allergies  Peanut-containing drug products and Penicillins  Home Medications   Prior to Admission medications   Medication Sig Start Date End Date Taking? Authorizing Provider  ibuprofen (ADVIL,MOTRIN) 200 MG tablet Take 200 mg by mouth every 6 (six) hours as needed. Pain    Historical Provider, MD  ketotifen (ZADITOR) 0.025 % ophthalmic solution Place 1 drop into both eyes 2 (two) times daily. Patient not taking: Reported on 09/27/2014 08/10/13   Janne Napoleon,  NP  lisinopril (PRINIVIL,ZESTRIL) 20 MG tablet Take 20 mg by mouth daily.    Historical Provider, MD  meloxicam (MOBIC) 7.5 MG tablet Take 1 tablet (7.5 mg total) by mouth daily. 09/27/14   Lance Bosch, NP  Phenyleph-Doxylamine-DM-APAP (ALKA-SELTZER PLS NIGHT CLD/FLU) 5-6.25-10-325 MG CAPS Take 1 tablet by mouth every 6 (six) hours as needed. Cold symptoms    Historical Provider, MD  traMADol (ULTRAM) 50 MG tablet Take 1 tablet (50 mg total) by mouth every 12 (twelve) hours as needed. 09/27/14   Lance Bosch, NP  trimethoprim-polymyxin b (POLYTRIM) ophthalmic solution Place 1 drop into both eyes every 4 (four) hours. Patient not taking: Reported on 09/27/2014 08/10/13   Janne Napoleon, NP   BP 136/78 mmHg  Pulse 65  Temp(Src) 97.9 F (36.6 C)  Resp 16  SpO2 96% Physical Exam  Constitutional: He is oriented to person, place, and time. He appears well-developed and well-nourished.  HENT:  Head: Normocephalic and atraumatic.  Nose: Nose normal.  Mouth/Throat: Oropharynx is clear and moist.  Eyes: Conjunctivae and EOM are normal. Pupils are equal, round, and reactive to light.  Neck: Normal range of motion. Neck supple. No JVD present. No tracheal deviation present. No thyromegaly present.  Cardiovascular: Normal rate, regular rhythm, normal heart sounds and intact distal pulses.  Exam reveals no gallop and no friction rub.   No murmur heard. Pulmonary/Chest: Effort normal and breath sounds normal. No stridor. No respiratory distress. He has  no wheezes. He has no rales. He exhibits no tenderness.  Abdominal: Soft. Bowel sounds are normal. He exhibits no distension and no mass. There is no tenderness. There is no rebound and no guarding.  Musculoskeletal: Normal range of motion. He exhibits tenderness (patient has tenderness over right trapezius and right posterior shoulder.  Palpation of these areas reproduces his pain.). He exhibits no edema.  Lymphadenopathy:    He has no cervical  adenopathy.  Neurological: He is alert and oriented to person, place, and time. He displays normal reflexes. He exhibits normal muscle tone. Coordination normal.  Skin: Skin is warm and dry. No rash noted. No erythema. No pallor.  Psychiatric: He has a normal mood and affect. His behavior is normal. Judgment and thought content normal.  Nursing note and vitals reviewed.   ED Course  Procedures (including critical care time) DIAGNOSTIC STUDIES: Oxygen Saturation is 96% on RA, normal by my interpretation.    COORDINATION OF CARE: 12:36 AM- Pt advised of plan for treatment and pt agrees.    Labs Review Labs Reviewed  BASIC METABOLIC PANEL - Abnormal; Notable for the following:    Glucose, Bld 100 (*)    GFR calc non Af Amer 79 (*)    All other components within normal limits  CBC  I-STAT TROPOININ, ED    Imaging Review No results found.   EKG Interpretation   Date/Time:  Friday January 27 2015 22:33:08 EDT Ventricular Rate:  73 PR Interval:  146 QRS Duration: 88 QT Interval:  376 QTC Calculation: 414 R Axis:   80 Text Interpretation:  Normal sinus rhythm Normal ECG Confirmed by Charee Tumblin   MD, Keldrick Pomplun ([id number]) on 01/28/2015 12:29:11 AM      MDM   Final diagnoses:  Trapezius muscle spasm  Radiculopathy affecting upper extremity    59 year old male with right shoulder, neck and arm pain ongoing for 3 days.  Patient was concerned about possible cardiac etiology for his pain.  Pain is reproducible with movement of the right shoulder joint and palpation of the right trapezius.  EKG and labs are unremarkable.  Patient reassured.  Will start on muscle and pain medication.  I personally performed the services described in this documentation, which was scribed in my presence. The recorded information has been reviewed and is accurate.    Linton Flemings, MD 01/29/15 [phone]

## 2015-01-31 ENCOUNTER — Ambulatory Visit: Payer: No Typology Code available for payment source | Attending: Internal Medicine | Admitting: Internal Medicine

## 2015-01-31 ENCOUNTER — Encounter: Payer: Self-pay | Admitting: Internal Medicine

## 2015-01-31 VITALS — BP 149/88 | HR 71 | Temp 97.7°F | Resp 16 | Ht 66.0 in | Wt 131.0 lb

## 2015-01-31 DIAGNOSIS — R202 Paresthesia of skin: Secondary | ICD-10-CM | POA: Diagnosis not present

## 2015-01-31 DIAGNOSIS — I10 Essential (primary) hypertension: Secondary | ICD-10-CM | POA: Diagnosis not present

## 2015-01-31 DIAGNOSIS — G588 Other specified mononeuropathies: Secondary | ICD-10-CM

## 2015-01-31 DIAGNOSIS — G589 Mononeuropathy, unspecified: Secondary | ICD-10-CM | POA: Diagnosis not present

## 2015-01-31 DIAGNOSIS — Z1211 Encounter for screening for malignant neoplasm of colon: Secondary | ICD-10-CM

## 2015-01-31 DIAGNOSIS — Z87891 Personal history of nicotine dependence: Secondary | ICD-10-CM | POA: Diagnosis not present

## 2015-01-31 DIAGNOSIS — M542 Cervicalgia: Secondary | ICD-10-CM | POA: Diagnosis not present

## 2015-01-31 DIAGNOSIS — R200 Anesthesia of skin: Secondary | ICD-10-CM | POA: Insufficient documentation

## 2015-01-31 DIAGNOSIS — S149XXA Injury of unspecified nerves of neck, initial encounter: Secondary | ICD-10-CM

## 2015-01-31 MED ORDER — CYCLOBENZAPRINE HCL 5 MG PO TABS: 5.0000 mg | ORAL_TABLET | Freq: Three times a day (TID) | ORAL | Status: DC | PRN

## 2015-01-31 MED ORDER — PREDNISONE 10 MG PO TABS: 10.0000 mg | ORAL_TABLET | Freq: Every day | ORAL | Status: DC

## 2015-01-31 MED ORDER — LISINOPRIL 20 MG PO TABS: 20.0000 mg | ORAL_TABLET | Freq: Every day | ORAL | Status: DC

## 2015-01-31 NOTE — Progress Notes (Signed)
Patient ID: Jonathan Dickson, male   DOB: Sep 07, 1956, 59 y.o.   MRN: [id number]  CC: ED f/u neck pain   HPI: Jonathan Dickson is a 59 y.o. male here today for a follow up visit. Past medical history of hypertension. Patient was seen in ER on 4/30 for severe neck pain after working.  Pain described as right arm shooting pain onset 1 week ago. He explains this shooting pain as a burning sensation with "electricity going through his arm". He reports associated numbness and tingling in his right arm and neck pain since onset. He denies CP, nausea, or vomiting. Reports that Robaxin makes him nauseous so he stopped taking it. Been using heating pad and ice with mild relief. He reports that he has been stressed with his current relationship and his boss so he has been losing weight. 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  Allergies  Allergen Reactions  . Peanut-Containing Drug Products   . Penicillins    Past Medical History  Diagnosis Date  . Hypertension    Current Outpatient Prescriptions on File Prior to Visit  Medication Sig Dispense Refill  . lisinopril (PRINIVIL,ZESTRIL) 20 MG tablet Take 20 mg by mouth daily.    . traMADol (ULTRAM) 50 MG tablet Take 1 tablet (50 mg total) by mouth every 6 (six) hours as needed. 30 tablet 0  . meloxicam (MOBIC) 7.5 MG tablet Take 1 tablet (7.5 mg total) by mouth daily. (Patient not taking: Reported on 01/31/2015) 60 tablet 1  . methocarbamol (ROBAXIN-750) 750 MG tablet Take 1 tablet (750 mg total) by mouth 4 (four) times daily. (Patient not taking: Reported on 01/31/2015) 40 tablet 0  . Phenyleph-Doxylamine-DM-APAP (ALKA-SELTZER PLS NIGHT CLD/FLU) 5-6.25-10-325 MG CAPS Take 1 tablet by mouth every 6 (six) hours as needed. Cold symptoms    . trimethoprim-polymyxin b (POLYTRIM) ophthalmic solution Place 1 drop into both eyes every 4 (four) hours. (Patient not taking: Reported on 09/27/2014) 10 mL 0   No current  facility-administered medications on file prior to visit.   No family history on file. History   Social History  . Marital Status: Legally Separated    Spouse Name: N/A  . Number of Children: N/A  . Years of Education: N/A   Occupational History  . Not on file.   Social History Main Topics  . Smoking status: Former Research scientist (life sciences)  . Smokeless tobacco: Not on file  . Alcohol Use: No  . Drug Use: No     Comment: former  . Sexual Activity: Not on file   Other Topics Concern  . Not on file   Social History Narrative    Review of Systems  HENT: Negative.   Respiratory: Negative.   Cardiovascular: Negative.   Neurological: Positive for tingling.      Objective:   Filed Vitals:   01/31/15 1453  BP: 149/88  Pulse: 71  Temp: 97.7 F (36.5 C)  Resp: 16    Physical Exam: Constitutional: Patient appears well-developed and well-nourished. No distress. Neck: Normal ROM. No JVD. CVS: RRR, S1/S2 +, no murmurs, no gallops, no carotid bruit.  Pulmonary: Effort and breath sounds normal, no stridor, rhonchi, wheezes, rales.  Abdominal: Soft. BS +,  no distension, tenderness, rebound or guarding.  Musculoskeletal: Normal range of motion. No edema  Neuro: Alert. Normal reflexes, muscle tone coordination. No cranial nerve deficit. Skin: Skin is warm and dry.   Lab Results  Component Value  Date   WBC 5.5 01/27/2015   HGB 14.3 01/27/2015   HCT 41.3 01/27/2015   MCV 84.3 01/27/2015   PLT 170 01/27/2015   Lab Results  Component Value Date   CREATININE 1.02 01/27/2015   BUN 16 01/27/2015   NA 135 01/27/2015   K 4.4 01/27/2015   CL 103 01/27/2015   CO2 24 01/27/2015    No results found for: HGBA1C Lipid Panel     Component Value Date/Time   CHOL 155 10/04/2014 1508   TRIG 87 10/04/2014 1508   HDL 54 10/04/2014 1508   CHOLHDL 2.9 10/04/2014 1508   VLDL 17 10/04/2014 1508   LDLCALC 84 10/04/2014 1508       Assessment and plan:   Jonathan Dickson was seen today for  follow-up.  Diagnoses and all orders for this visit:  Pinched nerve in neck Orders: -   Begin cyclobenzaprine (FLEXERIL) 5 MG tablet; Take 1 tablet (5 mg total) by mouth 3 (three) times daily as needed for muscle spasms. -     predniSONE (DELTASONE) 10 MG tablet; Take 1 tablet (10 mg total) by mouth daily with breakfast. 6-5-4-3-2-1. Take 6 tablets today and decrease by one tablet per day Discontinue Robaxin, switch to flexeril. Given prednisone to help with inflammation. No Nsaids for this patient due to HTN.  Essential hypertension Orders: -   Refill lisinopril (PRINIVIL,ZESTRIL) 20 MG tablet; Take 1 tablet (20 mg total) by mouth daily. Patients BP is elevated today, likely due to situational stress. He is usually controlled at other visits.  Colon cancer screening Orders: -     HM COLONOSCOPY  Return in about 6 months (around 08/03/2015) for Hypertension.        Chari Manning, NP-C [facility] [phone] 01/31/2015, 3:42 PM

## 2015-01-31 NOTE — Progress Notes (Signed)
ED follow up w/ severe pain in his neck.

## 2015-02-13 ENCOUNTER — Telehealth: Payer: Self-pay | Admitting: Internal Medicine

## 2015-02-13 NOTE — Telephone Encounter (Signed)
Pt following up on previous call. Please f/u with pt.

## 2015-02-13 NOTE — Telephone Encounter (Signed)
Patient called requesting to speak to nurse regarding neck pain, patient states he is still not feeling well and is having a lot of pain. Patient would like to know if he could get something stronger for the pain. Please f/u with patient .

## 2015-02-15 NOTE — Telephone Encounter (Signed)
Patient is returning phone call from nurse, please f/u °

## 2015-02-15 NOTE — Telephone Encounter (Signed)
Left HIPAA compliant message on patient's VM to return call

## 2015-03-23 ENCOUNTER — Telehealth: Payer: Self-pay | Admitting: Internal Medicine

## 2015-03-23 NOTE — Telephone Encounter (Signed)
Patient has come in today to request a medication refill for Meloxicam 7.5; please f/u with patient about this request

## 2015-03-24 ENCOUNTER — Other Ambulatory Visit: Payer: Self-pay

## 2015-03-24 NOTE — Telephone Encounter (Signed)
Can I call in refill for patient Meloxicam?

## 2015-03-24 NOTE — Telephone Encounter (Signed)
30 tablets only please.

## 2015-03-27 ENCOUNTER — Other Ambulatory Visit: Payer: Self-pay

## 2015-03-27 DIAGNOSIS — M79604 Pain in right leg: Secondary | ICD-10-CM

## 2015-03-27 MED ORDER — MELOXICAM 7.5 MG PO TABS: 7.5000 mg | ORAL_TABLET | Freq: Every day | ORAL | Status: DC

## 2015-04-05 ENCOUNTER — Ambulatory Visit: Payer: No Typology Code available for payment source | Attending: Internal Medicine | Admitting: Internal Medicine

## 2015-04-05 ENCOUNTER — Encounter: Payer: Self-pay | Admitting: Internal Medicine

## 2015-04-05 VITALS — BP 150/90 | HR 76 | Temp 98.2°F | Resp 16 | Ht 66.0 in | Wt 132.0 lb

## 2015-04-05 DIAGNOSIS — M25512 Pain in left shoulder: Secondary | ICD-10-CM | POA: Insufficient documentation

## 2015-04-05 DIAGNOSIS — I10 Essential (primary) hypertension: Secondary | ICD-10-CM | POA: Diagnosis not present

## 2015-04-05 DIAGNOSIS — M25511 Pain in right shoulder: Secondary | ICD-10-CM | POA: Diagnosis not present

## 2015-04-05 DIAGNOSIS — M542 Cervicalgia: Secondary | ICD-10-CM | POA: Insufficient documentation

## 2015-04-05 MED ORDER — TRAMADOL HCL 50 MG PO TABS: 50.0000 mg | ORAL_TABLET | Freq: Four times a day (QID) | ORAL | Status: DC | PRN

## 2015-04-05 NOTE — Progress Notes (Signed)
F/U back pain due to pinch nerve Unable to lift arm up without arm giving up

## 2015-04-05 NOTE — Progress Notes (Signed)
Patient ID: Jonathan Dickson, male   DOB: 1956-04-14, 59 y.o.   MRN: [id number]  CC: back pain, arm pain   HPI: Jonathan Dickson is a 59 y.o. male here today for a follow up visit.  Patient has past medical history of hypertension. Patient reports that he has continued to have neck and upper back pain for the past 4 months. He notes that the pain is becoming increasingly intense after he works. He states that it is causing him to have limited range of motion in his shoulders and ability to lift above head. He has been using Meloxicam for pain which normally helps his pain. He is concerned that the pain is interfering with his job function.   Patient has No headache, No chest pain, No abdominal pain - No Nausea, No new weakness tingling or numbness, No Cough - SOB.  Allergies  Allergen Reactions  . Peanut-Containing Drug Products   . Penicillins    Past Medical History  Diagnosis Date  . Hypertension    Current Outpatient Prescriptions on File Prior to Visit  Medication Sig Dispense Refill  . cyclobenzaprine (FLEXERIL) 5 MG tablet Take 1 tablet (5 mg total) by mouth 3 (three) times daily as needed for muscle spasms. 60 tablet 1  . lisinopril (PRINIVIL,ZESTRIL) 20 MG tablet Take 1 tablet (20 mg total) by mouth daily. 30 tablet 5  . meloxicam (MOBIC) 7.5 MG tablet Take 1 tablet (7.5 mg total) by mouth daily. 30 tablet 0  . Phenyleph-Doxylamine-DM-APAP (ALKA-SELTZER PLS NIGHT CLD/FLU) 5-6.25-10-325 MG CAPS Take 1 tablet by mouth every 6 (six) hours as needed. Cold symptoms    . predniSONE (DELTASONE) 10 MG tablet Take 1 tablet (10 mg total) by mouth daily with breakfast. 6-5-4-3-2-1. Take 6 tablets today and decrease by one tablet per day 21 tablet 0  . traMADol (ULTRAM) 50 MG tablet Take 1 tablet (50 mg total) by mouth every 6 (six) hours as needed. 30 tablet 0  . trimethoprim-polymyxin b (POLYTRIM) ophthalmic solution Place 1 drop into both eyes every 4 (four) hours. 10 mL 0   No current  facility-administered medications on file prior to visit.   History reviewed. No pertinent family history. History   Social History  . Marital Status: Legally Separated    Spouse Name: N/A  . Number of Children: N/A  . Years of Education: N/A   Occupational History  . Not on file.   Social History Main Topics  . Smoking status: Former Research scientist (life sciences)  . Smokeless tobacco: Not on file  . Alcohol Use: No  . Drug Use: No     Comment: former  . Sexual Activity: Not on file   Other Topics Concern  . Not on file   Social History Narrative    Review of Systems: See HPI   Objective:   Filed Vitals:   04/05/15 1513  BP: 150/90  Pulse: 76  Temp: 98.2 F (36.8 C)  Resp: 16    Physical Exam  Constitutional: He is oriented to person, place, and time.  Cardiovascular: Normal rate, regular rhythm and normal heart sounds.   Pulmonary/Chest: Effort normal and breath sounds normal.  Musculoskeletal: He exhibits tenderness (neck and shoulder).  Neurological: He is alert and oriented to person, place, and time.  Skin: Skin is warm and dry.    Lab Results  Component Value Date   WBC 5.5 01/27/2015   HGB 14.3 01/27/2015   HCT 41.3 01/27/2015   MCV 84.3 01/27/2015   PLT 170  01/27/2015   Lab Results  Component Value Date   CREATININE 1.02 01/27/2015   BUN 16 01/27/2015   NA 135 01/27/2015   K 4.4 01/27/2015   CL 103 01/27/2015   CO2 24 01/27/2015    No results found for: HGBA1C Lipid Panel     Component Value Date/Time   CHOL 155 10/04/2014 1508   TRIG 87 10/04/2014 1508   HDL 54 10/04/2014 1508   CHOLHDL 2.9 10/04/2014 1508   VLDL 17 10/04/2014 1508   LDLCALC 84 10/04/2014 1508       Assessment and plan:   Jonathan Dickson was seen today for follow-up.  Diagnoses and all orders for this visit:  Neck pain Orders: -     traMADol (ULTRAM) 50 MG tablet; Take 1 tablet (50 mg total) by mouth every 6 (six) hours as needed. -     Ambulatory referral to Orthopedic  Surgery  Bilateral shoulder pain See above   Return in about 3 months (around 07/06/2015) for Hypertension and 4 weeks RN-BP check .       Chari Manning, NP-C [facility] [phone] 04/05/2015, 3:59 PM

## 2015-04-18 ENCOUNTER — Ambulatory Visit (INDEPENDENT_AMBULATORY_CARE_PROVIDER_SITE_OTHER): Payer: No Typology Code available for payment source | Admitting: Family Medicine

## 2015-04-18 ENCOUNTER — Encounter: Payer: Self-pay | Admitting: Family Medicine

## 2015-04-18 VITALS — BP 150/85 | HR 97 | Ht 66.0 in | Wt 134.0 lb

## 2015-04-18 DIAGNOSIS — M542 Cervicalgia: Secondary | ICD-10-CM | POA: Diagnosis not present

## 2015-04-18 DIAGNOSIS — M5412 Radiculopathy, cervical region: Secondary | ICD-10-CM

## 2015-04-19 ENCOUNTER — Ambulatory Visit
Admission: RE | Admit: 2015-04-19 | Discharge: 2015-04-19 | Disposition: A | Discharge status: Home / Self Care | Payer: No Typology Code available for payment source | Source: Ambulatory Visit | Attending: Family Medicine | Admitting: Family Medicine

## 2015-04-19 DIAGNOSIS — M542 Cervicalgia: Secondary | ICD-10-CM

## 2015-04-19 DIAGNOSIS — M5412 Radiculopathy, cervical region: Secondary | ICD-10-CM | POA: Insufficient documentation

## 2015-04-19 NOTE — Progress Notes (Signed)
Jonathan Dickson - 59 y.o. male MRN [id number]  Date of birth: 1956-08-28  CC: Neck and shoulder pain.   SUBJECTIVE:   HPI  Jonathan Dickson is a 59 y.o. with hypertension here for an initial visit regarding bilateral shoulder pain, neck pain, and tingling/numbness.  Patient reports that he has continued to have neck and upper back pain for the past 6 months. He notes that the pain is becoming increasingly intense. It began on the right side and has now began bothering his left side.   He has the most trouble lifting things above his head. He has been using Meloxicam, ultram, and robaxin, none of which provide much pain relief. The pain persists in his neck, but also radiates to his lateral shoulders and palms intermittently.  He has started taking tylenol arthritis to help him sleep. He works in [employer] injury [employer]) and has quite a bit of trouble lifting boxes over his head.  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   Patient has no fevers, chills, or headaches. He denies loss of bowel or bladder control. He also denies lower extremity weakness.   ROS:     Negative other than that listed in the HPI.   HISTORY: Past Medical, Surgical, Social, and Family History Reviewed & Updated per EMR.  Pertinent Historical Findings include:   OBJECTIVE: BP 150/85 mmHg  Pulse 97  Ht 5\' 6"  (1.676 m)  Wt 134 lb (60.782 kg)  BMI 21.64 kg/m2  Physical Exam  GEN: NAD. Pain with arm motion above his head bilaterally. Sitting comfortably on exam table.  Resp: non-labored.  - Neck: Tenderness present over paracervical muscles and rhomboids b/l.  Negative spurling's b/l Limited neck range of motion to 45 degrees lateral rotation bilaterally. Lateral flexion is also limited Grip strength and sensation normal in bilateral hands.  Strength good C4 to T1 distribution, although he has trouble engaging his rotator  cuff/deltoid on arm abduction.   No sensory change to C4 to T1 Reflexes normal  -Shoulder: Inspection reveals no abnormalities, atrophy or asymmetry. Palpation is normal with no tenderness over AC joint or bicipital groove. Passive ROM is full in all planes. Rotator cuff strength normal throughout except that he uses significant scapulothoracic motion to abduct his arms.   No signs of impingement with negative Neer and Hawkin's tests, empty can. Abnormal scapular function observed. No winging, but too much scapular motion needed for arm abduction b/l.  No drop arm sign.    MEDICATIONS, LABS & OTHER ORDERS: Previous Medications   CYCLOBENZAPRINE (FLEXERIL) 5 MG TABLET    Take 1 tablet (5 mg total) by mouth 3 (three) times daily as needed for muscle spasms.   LISINOPRIL (PRINIVIL,ZESTRIL) 20 MG TABLET    Take 1 tablet (20 mg total) by mouth daily.   MELOXICAM (MOBIC) 7.5 MG TABLET    Take 1 tablet (7.5 mg total) by mouth daily.   PHENYLEPH-DOXYLAMINE-DM-APAP (ALKA-SELTZER PLS NIGHT CLD/FLU) 5-6.25-10-325 MG CAPS    Take 1 tablet by mouth every 6 (six) hours as needed. Cold symptoms   TRAMADOL (ULTRAM) 50 MG TABLET    Take 1 tablet (50 mg total) by mouth every 6 (six) hours as needed.   TRIMETHOPRIM-POLYMYXIN B (POLYTRIM) OPHTHALMIC SOLUTION    Place 1 drop into both eyes every 4 (four) hours.   Modified Medications   No medications on file   New Prescriptions   No  medications on file   Discontinued Medications   No medications on file   Orders Placed This Encounter  Procedures  . DG Cervical Spine 2 or 3 views  . MR Cervical Spine Wo Contrast   ASSESSMENT & PLAN: See problem based charting & AVS for pt instructions.

## 2015-04-25 ENCOUNTER — Other Ambulatory Visit: Payer: Self-pay | Admitting: Internal Medicine

## 2015-04-25 ENCOUNTER — Other Ambulatory Visit: Payer: Self-pay | Admitting: *Deleted

## 2015-04-25 MED ORDER — PREDNISONE 10 MG PO TABS: ORAL_TABLET | ORAL | Status: DC

## 2015-04-26 ENCOUNTER — Inpatient Hospital Stay: Admission: RE | Admit: 2015-04-26 | Payer: No Typology Code available for payment source | Source: Ambulatory Visit

## 2015-04-26 MED ORDER — PREDNISONE 10 MG PO TABS: ORAL_TABLET | ORAL | Status: DC

## 2015-04-26 NOTE — Addendum Note (Signed)
Addended by: Cyd Silence on: 04/26/2015 08:34 AM   Modules accepted: Orders

## 2015-05-03 ENCOUNTER — Ambulatory Visit: Payer: Self-pay | Attending: Internal Medicine | Admitting: Pharmacist

## 2015-05-03 VITALS — BP 132/82

## 2015-05-03 DIAGNOSIS — I10 Essential (primary) hypertension: Secondary | ICD-10-CM

## 2015-05-03 DIAGNOSIS — Z87891 Personal history of nicotine dependence: Secondary | ICD-10-CM | POA: Insufficient documentation

## 2015-05-03 NOTE — Patient Instructions (Signed)
It was great to see you today!  Keep taking your blood pressure medications (lisinopril) every day.  DASH Eating Plan DASH stands for "Dietary Approaches to Stop Hypertension." The DASH eating plan is a healthy eating plan that has been shown to reduce high blood pressure (hypertension). Additional health benefits may include reducing the risk of type 2 diabetes mellitus, heart disease, and stroke. The DASH eating plan may also help with weight loss. WHAT DO I NEED TO KNOW ABOUT THE DASH EATING PLAN? For the DASH eating plan, you will follow these general guidelines:  Choose foods with a percent daily value for sodium of less than 5% (as listed on the food label).  Use salt-free seasonings or herbs instead of table salt or sea salt.  Check with your health care provider or pharmacist before using salt substitutes.  Eat lower-sodium products, often labeled as "lower sodium" or "no salt added."  Eat fresh foods.  Eat more vegetables, fruits, and low-fat dairy products.  Choose whole grains. Look for the word "whole" as the first word in the ingredient list.  Choose fish and skinless chicken or [place] more often than red meat. Limit fish, poultry, and meat to 6 oz (170 g) each day.  Limit sweets, desserts, sugars, and sugary drinks.  Choose heart-healthy fats.  Limit cheese to 1 oz (28 g) per day.  Eat more home-cooked food and less restaurant, buffet, and fast food.  Limit fried foods.  Cook foods using methods other than frying.  Limit canned vegetables. If you do use them, rinse them well to decrease the sodium.  When eating at a restaurant, ask that your food be prepared with less salt, or no salt if possible. WHAT FOODS CAN I EAT? Seek help from a dietitian for individual calorie needs. Grains Whole grain or whole wheat bread. Brown rice. Whole grain or whole wheat pasta. Quinoa, bulgur, and whole grain cereals. Low-sodium cereals. Corn or whole wheat flour tortillas.  Whole grain cornbread. Whole grain crackers. Low-sodium crackers. Vegetables Fresh or frozen vegetables (raw, steamed, roasted, or grilled). Low-sodium or reduced-sodium tomato and vegetable juices. Low-sodium or reduced-sodium tomato sauce and paste. Low-sodium or reduced-sodium canned vegetables.  Fruits All fresh, canned (in natural juice), or frozen fruits. Meat and Other Protein Products Ground beef (85% or leaner), grass-fed beef, or beef trimmed of fat. Skinless chicken or [place]. Ground chicken or [place]. Pork trimmed of fat. All fish and seafood. Eggs. Dried beans, peas, or lentils. Unsalted nuts and seeds. Unsalted canned beans. Dairy Low-fat dairy products, such as skim or 1% milk, 2% or reduced-fat cheeses, low-fat ricotta or cottage cheese, or plain low-fat yogurt. Low-sodium or reduced-sodium cheeses. Fats and Oils Tub margarines without trans fats. Light or reduced-fat mayonnaise and salad dressings (reduced sodium). Avocado. Safflower, olive, or canola oils. Natural peanut or almond butter. Other Unsalted popcorn and pretzels. The items listed above may not be a complete list of recommended foods or beverages. Contact your dietitian for more options. WHAT FOODS ARE NOT RECOMMENDED? Grains White bread. White pasta. White rice. Refined cornbread. Bagels and croissants. Crackers that contain trans fat. Vegetables Creamed or fried vegetables. Vegetables in a cheese sauce. Regular canned vegetables. Regular canned tomato sauce and paste. Regular tomato and vegetable juices. Fruits Dried fruits. Canned fruit in light or heavy syrup. Fruit juice. Meat and Other Protein Products Fatty cuts of meat. Ribs, chicken wings, bacon, sausage, bologna, salami, chitterlings, fatback, hot dogs, bratwurst, and packaged luncheon meats. Salted nuts and seeds. Canned  beans with salt. Dairy Whole or 2% milk, cream, half-and-half, and cream cheese. Whole-fat or sweetened yogurt. Full-fat cheeses or  blue cheese. Nondairy creamers and whipped toppings. Processed cheese, cheese spreads, or cheese curds. Condiments Onion and garlic salt, seasoned salt, table salt, and sea salt. Canned and packaged gravies. Worcestershire sauce. Tartar sauce. Barbecue sauce. Teriyaki sauce. Soy sauce, including reduced sodium. Steak sauce. Fish sauce. Oyster sauce. Cocktail sauce. Horseradish. Ketchup and mustard. Meat flavorings and tenderizers. Bouillon cubes. Hot sauce. Tabasco sauce. Marinades. Taco seasonings. Relishes. Fats and Oils Butter, stick margarine, lard, shortening, ghee, and bacon fat. Coconut, palm kernel, or palm oils. Regular salad dressings. Other Pickles and olives. Salted popcorn and pretzels. The items listed above may not be a complete list of foods and beverages to avoid. Contact your dietitian for more information. WHERE CAN I FIND MORE INFORMATION? National Heart, Lung, and Blood Institute: travelstabloid.com Document Released: 09/05/2011 Document Revised: 01/31/2014 Document Reviewed: 07/21/2013 [facility] Patient Information 2015 [place], [place]. This information is not intended to replace advice given to you by your health care provider. Make sure you discuss any questions you have with your health care provider.

## 2015-05-03 NOTE — Progress Notes (Signed)
S:    Patient arrives in good spirits. He presents to the clinic for blood pressure evaluation.   Patient reports adherence with lisinopril. He reports that he took his dose already for today.   Current BP Medications include:  Lisinopril 20 mg daily   O:   Last 3 Office BP readings: BP Readings from Last 3 Encounters:  05/03/15 132/82  04/18/15 150/85  04/05/15 150/90    BMET    Component Value Date/Time   NA 135 01/27/2015 2237   K 4.4 01/27/2015 2237   CL 103 01/27/2015 2237   CO2 24 01/27/2015 2237   GLUCOSE 100* 01/27/2015 2237   BUN 16 01/27/2015 2237   CREATININE 1.02 01/27/2015 2237   CREATININE 0.91 10/04/2014 1508   CALCIUM 9.1 01/27/2015 2237   GFRNONAA 79* 01/27/2015 2237   GFRNONAA >89 10/04/2014 1508   GFRAA >90 01/27/2015 2237   GFRAA >89 10/04/2014 1508    A/P:  Hypertension which currently is controlled on lisinopril 20 mg. No recommendations for any medication changes at this time. Reviewed and updated both allergies and medication list. Provided educational information on DASH diet. Results reviewed and written information provided.   F/U Clinic Visit with Chari Manning as directed.  Total time in face-to-face counseling 10 minutes.

## 2015-05-09 ENCOUNTER — Ambulatory Visit (INDEPENDENT_AMBULATORY_CARE_PROVIDER_SITE_OTHER): Payer: Self-pay | Admitting: Family Medicine

## 2015-05-09 ENCOUNTER — Encounter: Payer: Self-pay | Admitting: Family Medicine

## 2015-05-09 VITALS — BP 153/94 | HR 90 | Ht 66.0 in | Wt 135.0 lb

## 2015-05-09 DIAGNOSIS — M25512 Pain in left shoulder: Secondary | ICD-10-CM

## 2015-05-09 DIAGNOSIS — M25511 Pain in right shoulder: Secondary | ICD-10-CM

## 2015-05-09 DIAGNOSIS — M542 Cervicalgia: Secondary | ICD-10-CM

## 2015-05-09 DIAGNOSIS — M5412 Radiculopathy, cervical region: Secondary | ICD-10-CM

## 2015-05-09 MED ORDER — TRAMADOL HCL 50 MG PO TABS: 50.0000 mg | ORAL_TABLET | Freq: Four times a day (QID) | ORAL | Status: DC | PRN

## 2015-05-10 NOTE — Progress Notes (Signed)
Jonathan Dickson - 59 y.o. male MRN [id number]  Date of birth: November 26, 1955  CC: bilateral neck and arm pain  SUBJECTIVE:   HPI Jonathan Dickson is a 59 y.o. with hypertension here for a follow-up visit regarding bilateral shoulder pain, neck pain, and tingling/numbness in his arms. Patient reports that he has continued to have neck and upper back pain for the past 7 months that has been slowly progressing. He had no previous neck or arm pain.  It began on the right side, but is now equal bilaterally. He has the most trouble lifting things above his head. He has been using Meloxicam, ultram, and robaxin, none of which provide much pain relief. Of all the medications Ultram has provided the most relief. The pain persists in his neck, but also radiates to his lateral shoulders and palms intermittently. He works in [employer] injury [facility] [employer]) and has quite a bit of trouble lifting boxes over his head. After last month's visit we repeated his cervical spine imaging which showed extensive changes consistent with DJD at C4-C7 with resulting loss of normal cervical lordosis. Initially we were planning given MRI however due to the patient's lack of insurance we decided to put him on a prednisone Dosepak. This helped with his pain for about a week and reduced it roughly 25%. Patient has no fevers, chills, or headaches. He denies loss of bowel or bladder control. He again denies lower extremity weakness.   He is working on [employer] production planner at the currently  ROS:     Review of systems negative other than that in history of present illness related to musculoskeletal issues  HISTORY: Past Medical, Surgical, Social, and Family History Reviewed & Updated per EMR.    OBJECTIVE: BP 153/94 mmHg  Pulse 90  Ht 5\' 6"  (1.676 m)  Wt 135 lb (61.236 kg)  BMI 21.80 kg/m2  Physical Exam GEN: NAD. Pain with arm motion above his head bilaterally. Sitting comfortably on exam table.  Resp: non-labored.  -  Neck: Tenderness present over paracervical muscles and rhomboids b/l.  Negative spurling's b/l for radiating pain Limited neck range of motion to 40 degrees lateral rotation bilaterally. Lateral flexion is also limited.  Grip strength and sensation normal in bilateral hands.  Strength good C4 to T1 distribution, although he has trouble engaging his rotator cuff/deltoid on arm abduction.  No sensory change to C4 to T1 Reflexes normal  -Shoulder: Inspection reveals no  asymmetry. There is potentially some atrophy around the shoulder. Palpation is abnormal with some tenderness over the before meals joint Passive ROM is full in all planes, however active range of motion is severely limited Rotator cuff strength is 5/5 other than 4/5 with empty can.   No signs of impingement with negative Neer and Hawkin's tests Abnormal scapular function observed. No winging, but too much scapular motion needed for arm abduction b/l.  Neg drop arm sign. Pain with crossarm abduction  MEDICATIONS, LABS & OTHER ORDERS: Previous Medications   CYCLOBENZAPRINE (FLEXERIL) 5 MG TABLET    Take 1 tablet (5 mg total) by mouth 3 (three) times daily as needed for muscle spasms.   LISINOPRIL (PRINIVIL,ZESTRIL) 20 MG TABLET    Take 1 tablet (20 mg total) by mouth daily.   MELOXICAM (MOBIC) 7.5 MG TABLET    Take 1 tablet (7.5 mg total) by mouth daily.   PHENYLEPH-DOXYLAMINE-DM-APAP (ALKA-SELTZER PLS NIGHT CLD/FLU) 5-6.25-10-325 MG CAPS    Take 1 tablet by mouth every 6 (six) hours  as needed. Cold symptoms   PREDNISONE (DELTASONE) 10 MG TABLET    Take as directed   TRIMETHOPRIM-POLYMYXIN B (POLYTRIM) OPHTHALMIC SOLUTION    Place 1 drop into both eyes every 4 (four) hours.   Modified Medications   Modified Medication Previous Medication   TRAMADOL (ULTRAM) 50 MG TABLET traMADol (ULTRAM) 50 MG tablet      Take 1 tablet (50 mg total) by mouth every 6 (six) hours as needed.    Take 1 tablet (50 mg total) by mouth every 6  (six) hours as needed.   New Prescriptions   No medications on file   Discontinued Medications   No medications on file  No orders of the defined types were placed in this encounter.   ASSESSMENT & PLAN: See problem based charting & AVS for pt instructions.

## 2015-05-30 ENCOUNTER — Other Ambulatory Visit: Payer: Self-pay | Admitting: Family Medicine

## 2015-05-30 ENCOUNTER — Other Ambulatory Visit: Payer: Self-pay | Admitting: *Deleted

## 2015-05-30 MED ORDER — TRAMADOL HCL 50 MG PO TABS: 50.0000 mg | ORAL_TABLET | Freq: Three times a day (TID) | ORAL | Status: DC | PRN

## 2015-05-30 NOTE — Telephone Encounter (Signed)
Is this ok to refill?  

## 2015-06-27 ENCOUNTER — Other Ambulatory Visit: Payer: Self-pay | Admitting: Family Medicine

## 2015-06-27 NOTE — Telephone Encounter (Signed)
Did you want to refill this?

## 2015-06-29 ENCOUNTER — Telehealth: Payer: Self-pay | Admitting: Internal Medicine

## 2015-06-29 NOTE — Telephone Encounter (Signed)
[facility] called requesting a verbal order for livocaine ointment 5 percent, for the patient. Please f/u.   Emelyn- [phone]

## 2015-07-12 NOTE — Telephone Encounter (Signed)
[facility] called requesting a verbal order for lidocaine ointment 5 percent for the pt. Please f/u   [facility]-[phone]

## 2015-07-17 ENCOUNTER — Other Ambulatory Visit: Payer: Self-pay | Admitting: Internal Medicine

## 2015-07-28 ENCOUNTER — Ambulatory Visit: Payer: No Typology Code available for payment source | Attending: Internal Medicine

## 2015-08-02 ENCOUNTER — Other Ambulatory Visit: Payer: Self-pay | Admitting: Family Medicine

## 2015-08-02 ENCOUNTER — Other Ambulatory Visit: Payer: Self-pay | Admitting: *Deleted

## 2015-08-02 MED ORDER — TRAMADOL HCL 50 MG PO TABS: 50.0000 mg | ORAL_TABLET | Freq: Three times a day (TID) | ORAL | Status: DC | PRN

## 2015-08-10 ENCOUNTER — Ambulatory Visit: Payer: No Typology Code available for payment source

## 2015-08-30 ENCOUNTER — Ambulatory Visit: Payer: No Typology Code available for payment source

## 2015-09-15 ENCOUNTER — Other Ambulatory Visit: Payer: Self-pay | Admitting: Internal Medicine

## 2015-09-15 DIAGNOSIS — I10 Essential (primary) hypertension: Secondary | ICD-10-CM

## 2015-09-15 NOTE — Telephone Encounter (Signed)
Pt. Called to request medication refill for Lisinopril....please follow up with patient

## 2015-09-18 ENCOUNTER — Telehealth: Payer: Self-pay

## 2015-09-18 NOTE — Telephone Encounter (Signed)
Returned phone call to patient  Patient not available Left message on voice mail to return our call RX for medication sent to pharmacy on file

## 2015-09-18 NOTE — Telephone Encounter (Signed)
Pt. Called requesting a med refill on Lisinopril. Pt. Stated he only has 1 pill left. Please f/u with pt.

## 2015-10-04 ENCOUNTER — Encounter: Payer: Self-pay | Admitting: Internal Medicine

## 2015-10-04 ENCOUNTER — Ambulatory Visit: Payer: BLUE CROSS/BLUE SHIELD | Attending: Internal Medicine | Admitting: Internal Medicine

## 2015-10-04 VITALS — BP 155/91 | HR 80 | Temp 98.0°F | Resp 16 | Ht 66.0 in | Wt 136.8 lb

## 2015-10-04 DIAGNOSIS — I10 Essential (primary) hypertension: Secondary | ICD-10-CM | POA: Diagnosis not present

## 2015-10-04 DIAGNOSIS — Z79899 Other long term (current) drug therapy: Secondary | ICD-10-CM | POA: Diagnosis not present

## 2015-10-04 MED ORDER — LISINOPRIL 30 MG PO TABS: 30.0000 mg | ORAL_TABLET | Freq: Every day | ORAL | Status: DC

## 2015-10-04 NOTE — Patient Instructions (Signed)
I have changed your Lisinopril to 30 mg daily. If you have 20 mg at home------you may take 1.5 tablets to equal 30 mg.  The new prescription will be for a 30 mg tablet

## 2015-10-04 NOTE — Progress Notes (Signed)
Patient here for follow up on HTN Patient currently on lisinopril

## 2015-10-04 NOTE — Progress Notes (Signed)
Patient ID: Jonathan Dickson, male   DOB: 05-Jul-1956, 60 y.o.   MRN: [id number] Subjective:  Jonathan Dickson is a 60 y.o. male with hypertension. Patient reports that he takes his Lisinopril daily without skipped doses or complications. Patient reports that he is constantly in pain and knows that it is possibly affecting his pressures. He states that he is waiting on his new insurance card before he goes back to orthopedics for his neck and shoulder pain.  Current Outpatient Prescriptions  Medication Sig Dispense Refill  . lisinopril (PRINIVIL,ZESTRIL) 20 MG tablet Take 1 tablet (20 mg total) by mouth daily. 30 tablet 5  . cyclobenzaprine (FLEXERIL) 5 MG tablet Take 1 tablet (5 mg total) by mouth 3 (three) times daily as needed for muscle spasms. (Patient not taking: Reported on 05/03/2015) 60 tablet 1  . lisinopril (PRINIVIL,ZESTRIL) 20 MG tablet TAKE 1 TABLET BY MOUTH DAILY. 30 tablet 1  . meloxicam (MOBIC) 7.5 MG tablet Take 1 tablet (7.5 mg total) by mouth daily. (Patient not taking: Reported on 04/18/2015) 30 tablet 0  . Phenyleph-Doxylamine-DM-APAP (ALKA-SELTZER PLS NIGHT CLD/FLU) 5-6.25-10-325 MG CAPS Take 1 tablet by mouth every 6 (six) hours as needed. Cold symptoms    . predniSONE (DELTASONE) 10 MG tablet Take as directed (Patient not taking: Reported on 05/03/2015) 21 tablet 0  . traMADol (ULTRAM) 50 MG tablet Take 1 tablet (50 mg total) by mouth every 8 (eight) hours as needed. 60 tablet 0  . trimethoprim-polymyxin b (POLYTRIM) ophthalmic solution Place 1 drop into both eyes every 4 (four) hours. (Patient not taking: Reported on 04/18/2015) 10 mL 0   No current facility-administered medications for this visit.    Hypertension ROS: taking medications as instructed, no medication side effects noted, no TIA's, no chest pain on exertion, no dyspnea on exertion, no swelling of ankles and no palpitations.   Objective:  BP 155/91 mmHg  Pulse 80  Temp(Src) 98 F (36.7 C)  Resp 16  Ht 5\' 6"  (1.676 m)   Wt 136 lb 12.8 oz (62.052 kg)  BMI 22.09 kg/m2  SpO2 100%  Appearance alert, well appearing, and in no distress, oriented to person, place, and time and normal appearing weight. General exam BP noted to be borderline elevated today in office, S1, S2 normal, no gallop, no murmur, chest clear, no JVD, no HSM, no edema.  Lab review: orders written for new lab studies as appropriate; see orders.   Assessment:   Jonathan Dickson was seen today for follow-up.  Diagnoses and all orders for this visit:  Essential hypertension -     lisinopril (PRINIVIL,ZESTRIL) 30 MG tablet; Take 1 tablet (30 mg total) by mouth daily. -     COMPLETE METABOLIC PANEL WITH GFR Patient BP is slightly above gaol for age. I have increased his Lisinopril to 30 mg daily. I will bring him back in 1 week to make sure he is at goal. Next visit patient will have a lipid panel drawn. Last LDL was 09/2014 and was at goal.  Deliah Boston diet advised.  Return in about 1 week (around 10/11/2015) for Nurse Visit-BP check and 3 mo PCP HTN.   Lance Bosch, NP 10/05/2015 9:37 AM

## 2015-10-05 LAB — COMPLETE METABOLIC PANEL WITH GFR
ALT: 22 U/L (ref 9–46)
AST: 24 U/L (ref 10–35)
Albumin: 4.2 g/dL (ref 3.6–5.1)
Alkaline Phosphatase: 56 U/L (ref 40–115)
BUN: 16 mg/dL (ref 7–25)
CHLORIDE: 104 mmol/L (ref 98–110)
CO2: 22 mmol/L (ref 20–31)
CREATININE: 0.84 mg/dL (ref 0.70–1.25)
Calcium: 9.6 mg/dL (ref 8.6–10.3)
GFR, Est Non African American: >89 mL/min (ref >=60)
Glucose, Bld: 88 mg/dL (ref 65–99)
Potassium: 4.2 mmol/L (ref 3.5–5.3)
Sodium: 138 mmol/L (ref 135–146)
Total Bilirubin: 0.5 mg/dL (ref 0.2–1.2)
Total Protein: 6.5 g/dL (ref 6.1–8.1)

## 2015-10-05 MED FILL — LISINOPRIL 10 MG TABLET: 10 | 30 days supply | Qty: 90 | Fill #0

## 2015-10-10 ENCOUNTER — Telehealth: Payer: Self-pay

## 2015-10-10 NOTE — Telephone Encounter (Signed)
-----   Message from Lance Bosch, NP sent at 10/08/2015  5:19 PM EST ----- Labs are within normal limits

## 2015-10-10 NOTE — Telephone Encounter (Signed)
Spoke with patient this am and he is aware of his normal  labs 

## 2015-10-13 ENCOUNTER — Other Ambulatory Visit: Payer: Self-pay | Admitting: *Deleted

## 2015-10-13 DIAGNOSIS — M5412 Radiculopathy, cervical region: Secondary | ICD-10-CM

## 2015-10-17 ENCOUNTER — Encounter: Payer: No Typology Code available for payment source | Admitting: Pharmacist

## 2015-10-19 ENCOUNTER — Ambulatory Visit: Payer: BLUE CROSS/BLUE SHIELD | Attending: Internal Medicine | Admitting: Pharmacist

## 2015-10-19 VITALS — BP 143/84 | HR 78

## 2015-10-19 DIAGNOSIS — Z79899 Other long term (current) drug therapy: Secondary | ICD-10-CM | POA: Diagnosis present

## 2015-10-19 DIAGNOSIS — I10 Essential (primary) hypertension: Secondary | ICD-10-CM | POA: Insufficient documentation

## 2015-10-19 NOTE — Progress Notes (Signed)
S:    Patient arrives in good spirits.  Presents to the clinic for hypertension evaluation.   Patient reports adherence with medications.  Current BP Medications include:  Lisinopril 30 mg daily   O:   Last 3 Office BP readings: BP Readings from Last 3 Encounters:  10/04/15 155/91  05/09/15 153/94  05/03/15 132/82    BMET    Component Value Date/Time   NA 138 10/04/2015 1708   K 4.2 10/04/2015 1708   CL 104 10/04/2015 1708   CO2 22 10/04/2015 1708   GLUCOSE 88 10/04/2015 1708   BUN 16 10/04/2015 1708   CREATININE 0.84 10/04/2015 1708   CREATININE 1.02 01/27/2015 2237   CALCIUM 9.6 10/04/2015 1708   GFRNONAA >89 10/04/2015 1708   GFRNONAA 79* 01/27/2015 2237   GFRAA >89 10/04/2015 1708   GFRAA >90 01/27/2015 2237    A/P: History of hypertension currently controlled on current medications.  Blood pressure goal is <150/90. Patient tolerating medication will so will continue lisinopril 30 mg daily.   Medication reconciliation completed. Results reviewed and written information provided.   Total time in face-to-face counseling 10 minutes.   F/U Clinic Visit with Chari Manning as directed.

## 2015-10-19 NOTE — Patient Instructions (Signed)
Your blood pressure looks great!  Follow up with Chari Manning as directed

## 2015-10-20 ENCOUNTER — Ambulatory Visit
Admission: RE | Admit: 2015-10-20 | Discharge: 2015-10-20 | Disposition: A | Discharge status: Home / Self Care | Payer: BLUE CROSS/BLUE SHIELD | Source: Ambulatory Visit | Attending: Family Medicine | Admitting: Family Medicine

## 2015-10-20 DIAGNOSIS — M5412 Radiculopathy, cervical region: Secondary | ICD-10-CM

## 2015-10-31 ENCOUNTER — Encounter: Payer: Self-pay | Admitting: Family Medicine

## 2015-10-31 ENCOUNTER — Ambulatory Visit (INDEPENDENT_AMBULATORY_CARE_PROVIDER_SITE_OTHER): Payer: BLUE CROSS/BLUE SHIELD | Admitting: Family Medicine

## 2015-10-31 VITALS — BP 152/92 | HR 73 | Ht 66.0 in | Wt 136.0 lb

## 2015-10-31 DIAGNOSIS — M5412 Radiculopathy, cervical region: Secondary | ICD-10-CM

## 2015-11-01 MED ORDER — GABAPENTIN 300 MG PO CAPS: 300.0000 mg | ORAL_CAPSULE | Freq: Every day | ORAL | Status: DC

## 2015-11-01 MED FILL — GABAPENTIN 300 MG CAPSULE: 300 | 30 days supply | Qty: 30 | Fill #0

## 2015-11-01 NOTE — Progress Notes (Signed)
Jonathan Dickson - 60 y.o. male MRN [id number]  Date of birth: May 05, 1956  CC: bilateral neck and arm pain  SUBJECTIVE:   HPI Jonathan Dickson is a 60 y.o. with hypertension here for a follow-up visit regarding bilateral shoulder pain, neck pain, and tingling/numbness in his arms.He has obtained insurance and recently had his MRI completed. He is here to follow-up on the results. He has had persistent and initially progressive bilateral neck and upper extremity neuropathic symptoms for the last 12 months. He had no previous neck or arm pain.  He has the most trouble lifting things above his head. He has been using Meloxicam, ultram, and robaxin, none of which provide much pain relief. Of all the medications Ultram has provided the most relief. The pain persists in his neck, but also radiates to his lateral shoulders and palms intermittently. He works in [employer] injury [facility]) and has quite a bit of trouble lifting boxes over his head. He was minimally responsive to a prednisone Dosepak prior to her last visit.  Patient has no fevers, chills, or headaches. He denies loss of bowel or bladder control. He feels as though he may be becoming weaker in his legs although he can't pinpoint exactly what is changing.   ROS:     Review of systems negative other than that in history of present illness related to musculoskeletal issues. As above he has no fevers chills or night sweats. His no loss of bowel or bladder dysfunction.  HISTORY: Past Medical, Surgical, Social, and Family History Reviewed & Updated per EMR.   Data review: Cervical MRI dated 10/20/2015: There appears to be severe degenerative multilevel changes. There are multiple levels of spinal and foraminal stenosis most prominent between C4-C7. While there is no myelopathic change, there appears to be no CSF buffer in several areas.  OBJECTIVE: BP 152/92 mmHg  Pulse 73  Ht 5\' 6"  (1.676 m)  Wt 136 lb (61.689 kg)  BMI 21.96  kg/m2  Physical Exam GEN: NAD. Pain with arm motion above his head bilaterally. Sitting comfortably on exam table.  Resp: non-labored.  - Neck: Tenderness present over paracervical muscles and rhomboids b/l.  Did not attempt spurling's today Limited neck range of motion to 35 degrees lateral rotation bilaterally. Lateral flexion is also limited bilaterally. Strength is 4+ out of 5 in bilateral upper extremities and lower extremities.  No sensory change to C4 to T1 Reflexes wnl in upper and lower extremities Negative Hoffmann's reflex  -Shoulder: He has full passive range of motion of the shoulders. He cannot forward flex or abduct the shoulders past 90 secondary to pain in his neck  MEDICATIONS, LABS & OTHER ORDERS: Previous Medications   LISINOPRIL (PRINIVIL,ZESTRIL) 30 MG TABLET    Take 1 tablet (30 mg total) by mouth daily.   PHENYLEPH-DOXYLAMINE-DM-APAP (ALKA-SELTZER PLS NIGHT CLD/FLU) 5-6.25-10-325 MG CAPS    Take 1 tablet by mouth every 6 (six) hours as needed. Cold symptoms   TRAMADOL (ULTRAM) 50 MG TABLET    Take 1 tablet (50 mg total) by mouth every 8 (eight) hours as needed.   Modified Medications   No medications on file   New Prescriptions   GABAPENTIN (NEURONTIN) 300 MG CAPSULE    Take 1 capsule (300 mg total) by mouth at bedtime.   Discontinued Medications   No medications on file   Orders Placed This Encounter  Procedures  . Ambulatory referral to Orthopedic Surgery   ASSESSMENT & PLAN: See problem based  charting & AVS for pt instructions.

## 2015-11-13 MED FILL — LISINOPRIL 10 MG TABLET: 10 | 30 days supply | Qty: 90 | Fill #1

## 2015-11-14 ENCOUNTER — Other Ambulatory Visit: Payer: Self-pay | Admitting: Orthopedic Surgery

## 2015-11-21 ENCOUNTER — Inpatient Hospital Stay (HOSPITAL_COMMUNITY): Admission: RE | Admit: 2015-11-21 | Payer: BLUE CROSS/BLUE SHIELD | Source: Ambulatory Visit

## 2015-11-22 MED FILL — traMADol HCL 50 MG TABS: 50 | 15 days supply | Qty: 90 | Fill #0 | Status: TO

## 2015-11-23 ENCOUNTER — Encounter (HOSPITAL_COMMUNITY): Admission: RE | Payer: Self-pay | Source: Ambulatory Visit

## 2015-11-23 ENCOUNTER — Ambulatory Visit (HOSPITAL_COMMUNITY)
Admission: RE | Admit: 2015-11-23 | Payer: BLUE CROSS/BLUE SHIELD | Source: Ambulatory Visit | Admitting: Orthopedic Surgery

## 2015-11-23 SURGERY — ANTERIOR CERVICAL DECOMPRESSION/DISCECTOMY FUSION 3 LEVELS
Anesthesia: General

## 2015-11-27 MED FILL — GABAPENTIN 300 MG CAPSULE: 300 | 30 days supply | Qty: 30 | Fill #1 | Status: TO

## 2015-12-04 MED FILL — METHOCARBAMOL 500 MG TABLET: 500 | 30 days supply | Qty: 60 | Fill #0 | Status: TO

## 2015-12-06 MED FILL — LISINOPRIL 10 MG TABLET: 10 | 30 days supply | Qty: 90 | Fill #2 | Status: TO

## 2016-01-11 ENCOUNTER — Other Ambulatory Visit: Payer: Self-pay | Admitting: Orthopedic Surgery

## 2016-01-22 ENCOUNTER — Telehealth: Payer: Self-pay | Admitting: Internal Medicine

## 2016-01-22 NOTE — Telephone Encounter (Signed)
Pt. Called requesting to have colonoscopy done. Pt. Does not remember if he spoke to his PCP about having one done. Please f/u with pt.

## 2016-01-23 ENCOUNTER — Encounter (HOSPITAL_COMMUNITY)
Admission: RE | Admit: 2016-01-23 | Discharge: 2016-01-23 | Disposition: A | Discharge status: Home / Self Care | Payer: Medicaid Other | Source: Ambulatory Visit | Attending: Orthopedic Surgery | Admitting: Orthopedic Surgery

## 2016-01-23 ENCOUNTER — Encounter (HOSPITAL_COMMUNITY): Payer: Self-pay

## 2016-01-23 DIAGNOSIS — Z01812 Encounter for preprocedural laboratory examination: Secondary | ICD-10-CM | POA: Insufficient documentation

## 2016-01-23 DIAGNOSIS — R9431 Abnormal electrocardiogram [ECG] [EKG]: Secondary | ICD-10-CM | POA: Insufficient documentation

## 2016-01-23 DIAGNOSIS — Z01818 Encounter for other preprocedural examination: Secondary | ICD-10-CM

## 2016-01-23 DIAGNOSIS — G959 Disease of spinal cord, unspecified: Secondary | ICD-10-CM | POA: Diagnosis not present

## 2016-01-23 DIAGNOSIS — Z79899 Other long term (current) drug therapy: Secondary | ICD-10-CM | POA: Insufficient documentation

## 2016-01-23 DIAGNOSIS — Z87891 Personal history of nicotine dependence: Secondary | ICD-10-CM | POA: Insufficient documentation

## 2016-01-23 DIAGNOSIS — I10 Essential (primary) hypertension: Secondary | ICD-10-CM | POA: Diagnosis not present

## 2016-01-23 HISTORY — DX: Unspecified convulsions: R56.9

## 2016-01-23 HISTORY — DX: Depression, unspecified: F32.A

## 2016-01-23 HISTORY — DX: Major depressive disorder, single episode, unspecified: F32.9

## 2016-01-23 HISTORY — DX: Anxiety disorder, unspecified: F41.9

## 2016-01-23 LAB — COMPREHENSIVE METABOLIC PANEL
ALBUMIN: 3.9 g/dL (ref 3.5–5.0)
ALK PHOS: 60 U/L (ref 38–126)
ALT: 22 U/L (ref 17–63)
AST: 31 U/L (ref 15–41)
Anion gap: 10 (ref 5–15)
BUN: 13 mg/dL (ref 6–20)
CO2: 23 mmol/L (ref 22–32)
CREATININE: 0.96 mg/dL (ref 0.61–1.24)
Calcium: 9.5 mg/dL (ref 8.9–10.3)
Chloride: 105 mmol/L (ref 101–111)
GFR calc Af Amer: >60 mL/min (ref >60)
GLUCOSE: 101 mg/dL — AB (ref 65–99)
Potassium: 4.5 mmol/L (ref 3.5–5.1)
Sodium: 138 mmol/L (ref 135–145)
TOTAL PROTEIN: 6.5 g/dL (ref 6.5–8.1)
Total Bilirubin: 1.1 mg/dL (ref 0.3–1.2)

## 2016-01-23 LAB — URINALYSIS, ROUTINE W REFLEX MICROSCOPIC
BILIRUBIN URINE: NEGATIVE
GLUCOSE, UA: NEGATIVE mg/dL
Hgb urine dipstick: NEGATIVE
KETONES UR: NEGATIVE mg/dL
Leukocytes, UA: NEGATIVE
Nitrite: NEGATIVE
PH: 6 (ref 5.0–8.0)
Protein, ur: NEGATIVE mg/dL
SPECIFIC GRAVITY, URINE: 1.014 (ref 1.005–1.030)

## 2016-01-23 LAB — CBC WITH DIFFERENTIAL/PLATELET
BASOS ABS: 0 10*3/uL (ref 0.0–0.1)
Basophils Relative: 1 %
EOS PCT: 2 %
Eosinophils Absolute: 0.1 10*3/uL (ref 0.0–0.7)
HCT: 42.8 % (ref 39.0–52.0)
Hemoglobin: 14.5 g/dL (ref 13.0–17.0)
LYMPHS ABS: 2.4 10*3/uL (ref 0.7–4.0)
LYMPHS PCT: 46 %
MCH: 28.4 pg (ref 26.0–34.0)
MCHC: 33.9 g/dL (ref 30.0–36.0)
MCV: 83.8 fL (ref 78.0–100.0)
MONO ABS: 0.2 10*3/uL (ref 0.1–1.0)
Monocytes Relative: 5 %
Neutro Abs: 2.3 10*3/uL (ref 1.7–7.7)
Neutrophils Relative %: 46 %
Platelets: 196 10*3/uL (ref 150–400)
RBC: 5.11 MIL/uL (ref 4.22–5.81)
RDW: 12.6 % (ref 11.5–15.5)
WBC: 5.1 10*3/uL (ref 4.0–10.5)

## 2016-01-23 LAB — PROTIME-INR
INR: 0.97 (ref 0.00–1.49)
PROTHROMBIN TIME: 13.1 s (ref 11.6–15.2)

## 2016-01-23 LAB — SURGICAL PCR SCREEN
MRSA, PCR: NEGATIVE
Staphylococcus aureus: NEGATIVE

## 2016-01-23 LAB — APTT: APTT: 27 s (ref 24–37)

## 2016-01-23 NOTE — Pre-Procedure Instructions (Signed)
Jonathan Dickson  01/23/2016      [facility] [place], [place] - [address] [place] [address] Phone: [phone] Fax: [place], [place] [address] Phone: [phone] Fax: [phone]    Your procedure is scheduled on May 4.  Report to [facility] Admitting at 530 A.M.  Call this number if you have problems the morning of surgery:  [phone]   Remember:  Do not eat food or drink liquids after midnight.  Take these medicines the morning of surgery with A SIP OF WATER NA  Stop taking aspirin, Ibuprofen, Advil, Motrin, Aleve, BC's, Goody's, Herbal medications, and Fish oil   Do not wear jewelry, make-up or nail polish.  Do not wear lotions, powders, or perfumes.  You may wear deodorant.  Do not shave 48 hours prior to surgery.  Men may shave face and neck.  Do not bring valuables to the hospital.  [facility] is not responsible for any belongings or valuables.  Contacts, dentures or bridgework may not be worn into surgery.  Leave your suitcase in the car.  After surgery it may be brought to your room.  For patients admitted to the hospital, discharge time will be determined by your treatment team.  Patients discharged the day of surgery will not be allowed to drive home.    Special instructions:  [facility] - Preparing for Surgery  Before surgery, you can play an important role.  Because skin is not sterile, your skin needs to be as free of germs as possible.  You can reduce the number of germs on you skin by washing with CHG (chlorahexidine gluconate) soap before surgery.  CHG is an antiseptic cleaner which kills germs and bonds with the skin to continue killing germs even after washing.  Please DO NOT use if you have an allergy to CHG or antibacterial soaps.  If your skin becomes reddened/irritated stop using the  CHG and inform your nurse when you arrive at Short Stay.  Do not shave (including legs and underarms) for at least 48 hours prior to the first CHG shower.  You may shave your face.  Please follow these instructions carefully:   1.  Shower with CHG Soap the night before surgery and the  morning of Surgery.  2.  If you choose to wash your hair, wash your hair first as usual with your   normal shampoo.  3.  After you shampoo, rinse your hair and body thoroughly to remove the  Shampoo.  4.  Use CHG as you would any other liquid soap.  You can apply chg directly  to the skin and wash gently with scrungie or a clean washcloth.  5.  Apply the CHG Soap to your body ONLY FROM THE NECK DOWN.   Do not use on open wounds or open sores.  Avoid contact with your eyes,  ears, mouth and genitals (private parts).  Wash genitals (private parts)   with your normal soap.  6.  Wash thoroughly, paying special attention to the area where your surgery  will be performed.  7.  Thoroughly rinse your body with warm water from the neck down.  8.  DO NOT shower/wash with your normal soap after using and rinsing off  the CHG Soap.  9.  Pat yourself dry with a clean towel.  10.  Wear clean pajamas.            11.  Place clean sheets on your bed the night of your first shower and do not sleep with pets.  Day of Surgery  Do not apply any lotions/deoderants the morning of surgery.  Please wear clean clothes to the hospital/surgery center.     Please read over the following fact sheets that you were given. Pain Booklet, Coughing and Deep Breathing, MRSA Information and Surgical Site Infection Prevention

## 2016-01-24 ENCOUNTER — Encounter (HOSPITAL_COMMUNITY): Payer: Self-pay | Admitting: Emergency Medicine

## 2016-01-24 NOTE — Progress Notes (Signed)
Anesthesia Chart Review:  Pt is a 60 year old male scheduled for C4-5, C5-6, C6-7 ACDF on 02/01/2016 with Dr. Lynann Bologna.   PMH includes:  HTN, seizures (with penicillin). Former smoker. BMI 24  Medications include: lisinopril  Preoperative labs reviewed.    Chest x-ray 01/23/16 reviewed. No active cardiopulmonary disease.   EKG 01/23/16: NSR. Minimal voltage criteria for LVH, may be normal variant. T wave abnormality, consider anterolateral ischemia new 12/2014  Reviewed case with Dr. Marcell Barlow. Pt will need cardiac clearance prior to surgery. Left voicemail about this for [facility] in Dr. Laurena Bering office.   Willeen Cass, FNP-BC [facility] Short Stay Surgical Center/Anesthesiology Phone: [phone] 01/24/2016 12:09 PM

## 2016-01-25 ENCOUNTER — Telehealth: Payer: Self-pay | Admitting: Cardiovascular Disease

## 2016-01-25 ENCOUNTER — Other Ambulatory Visit: Payer: Self-pay | Admitting: Internal Medicine

## 2016-01-25 ENCOUNTER — Telehealth: Payer: Self-pay

## 2016-01-25 DIAGNOSIS — Z1211 Encounter for screening for malignant neoplasm of colon: Secondary | ICD-10-CM

## 2016-01-25 DIAGNOSIS — R109 Unspecified abdominal pain: Secondary | ICD-10-CM

## 2016-01-25 NOTE — Progress Notes (Unsigned)
Message will be routed to Dr. Doreene Burke for approval.

## 2016-01-25 NOTE — Telephone Encounter (Signed)
Received records from [facility] for appointment with Dr [name] on 02/01/16.  Records given to [facility] (medical records) for Dr Kennon Holter schedule on 02/01/16. lp

## 2016-01-26 ENCOUNTER — Encounter: Payer: Self-pay | Admitting: Gastroenterology

## 2016-02-01 ENCOUNTER — Ambulatory Visit (INDEPENDENT_AMBULATORY_CARE_PROVIDER_SITE_OTHER): Payer: BLUE CROSS/BLUE SHIELD | Admitting: Cardiovascular Disease

## 2016-02-01 ENCOUNTER — Encounter (HOSPITAL_COMMUNITY): Admission: RE | Payer: Self-pay | Source: Ambulatory Visit

## 2016-02-01 ENCOUNTER — Ambulatory Visit (HOSPITAL_COMMUNITY)
Admission: RE | Admit: 2016-02-01 | Payer: BLUE CROSS/BLUE SHIELD | Source: Ambulatory Visit | Admitting: Orthopedic Surgery

## 2016-02-01 ENCOUNTER — Encounter: Payer: Self-pay | Admitting: Cardiovascular Disease

## 2016-02-01 VITALS — BP 130/84 | HR 92 | Ht 66.0 in | Wt 146.2 lb

## 2016-02-01 DIAGNOSIS — I159 Secondary hypertension, unspecified: Secondary | ICD-10-CM | POA: Diagnosis not present

## 2016-02-01 SURGERY — ANTERIOR CERVICAL DECOMPRESSION/DISCECTOMY FUSION 3 LEVELS
Anesthesia: General

## 2016-02-01 NOTE — Progress Notes (Signed)
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 Smoking 13 years ago. He's never had a heart attack or stroke. He denies chest pain or shortness of breath.   Current Outpatient Prescriptions  Medication Sig Dispense Refill  . gabapentin (NEURONTIN) 300 MG capsule Take 1 capsule (300 mg total) by mouth at bedtime. 30 capsule 2  . ibuprofen (ADVIL,MOTRIN) 400 MG tablet Take 400 mg by mouth every 6 (six) hours as needed for mild pain.    Marland Kitchen lisinopril (PRINIVIL,ZESTRIL) 30 MG tablet Take 1 tablet (30 mg total) by mouth daily. 30 tablet 4  . methocarbamol (ROBAXIN) 500 MG tablet Take 500 mg by mouth 2 (two) times daily as needed for muscle spasms.    . traMADol (ULTRAM) 50 MG tablet Take 50 mg by mouth every 12 (twelve) hours as needed for moderate pain.     No current facility-administered medications for this visit.    Allergies  Allergen Reactions  . Peanut-Containing Drug Products Swelling  . Penicillins Other (See Comments)    Convulsions Has patient had a PCN reaction causing immediate rash, facial/tongue/throat swelling, SOB or lightheadedness with hypotension: No Has patient had a PCN reaction causing severe rash involving mucus membranes or skin necrosis: No Has patient had a PCN reaction that required hospitalization No Has patient had a PCN reaction occurring within the last 10 years: No If all of the above answers are "NO", then may proceed with Cephalosporin use.     Social History   Social History  .  Marital Status: Legally Separated    Spouse Name: N/A  . Number of Children: N/A  . Years of Education: N/A   Occupational History  . Not on file.   Social History Main Topics  . Smoking status: Former Smoker -- 1.50 packs/day for 8 years    Quit date: 03/15/2003  . Smokeless tobacco: Not on file  . Alcohol Use: No     Comment: none since 2004  . Drug Use: No     Comment: former  none since 2004  . Sexual Activity: Not on file   Other Topics Concern  . Not on file   Social History Narrative     Review of Systems: General: negative for chills, fever, night sweats or weight changes.  Cardiovascular: negative for chest pain, dyspnea on exertion, edema, orthopnea, palpitations, paroxysmal nocturnal dyspnea or shortness of breath Dermatological: negative for rash Respiratory: negative for cough or wheezing Urologic: negative for hematuria Abdominal: negative for nausea, vomiting, diarrhea, bright red blood per rectum, melena, or hematemesis Neurologic: negative for visual changes, syncope, or dizziness All other systems reviewed and are otherwise negative except as noted above.    Blood pressure 130/84, pulse 92, height 5\' 6"  (1.676 m), weight 146 lb 4 oz (66.339 kg).  General appearance: alert and no distress Neck: no adenopathy, no carotid bruit, no JVD, supple, symmetrical, trachea midline and thyroid not enlarged, symmetric, no tenderness/mass/nodules Lungs: clear to auscultation bilaterally Heart: regular rate and rhythm, S1, S2 normal, no murmur, click, rub or  gallop Extremities: extremities normal, atraumatic, no cyanosis or edema  EKG not performed today  ASSESSMENT AND PLAN:   HTN (hypertension) History of hypertension with blood pressure measured today of 130/84. He is on lisinopril. Continue current meds at current dosing      Lorretta Harp MD [facility] 02/01/2016 1:51 PM

## 2016-02-01 NOTE — Assessment & Plan Note (Signed)
History of hypertension with blood pressure measured today of 130/84. He is on lisinopril. Continue current meds at current dosing

## 2016-02-01 NOTE — Patient Instructions (Signed)
Medication Instructions:  Your physician recommends that you continue on your current medications as directed. Please refer to the Current Medication list given to you today.   Labwork: none  Testing/Procedures: Your physician has requested that you have an echocardiogram. Echocardiography is a painless test that uses sound waves to create images of your heart. It provides your doctor with information about the size and shape of your heart and how well your heart's chambers and valves are working. This procedure takes approximately one hour. There are no restrictions for this procedure. ASAP   Follow-Up: Follow up with Dr. [name] as needed.   Any Other Special Instructions Will Be Listed Below (If Applicable).     If you need a refill on your cardiac medications before your next appointment, please call your pharmacy.

## 2016-02-05 ENCOUNTER — Other Ambulatory Visit: Payer: Self-pay | Admitting: Cardiovascular Disease

## 2016-02-05 ENCOUNTER — Ambulatory Visit (HOSPITAL_COMMUNITY)
Admission: RE | Admit: 2016-02-05 | Discharge: 2016-02-05 | Disposition: A | Discharge status: Home / Self Care | Payer: Medicaid Other | Source: Ambulatory Visit | Attending: Cardiology | Admitting: Cardiology

## 2016-02-05 ENCOUNTER — Other Ambulatory Visit: Payer: Self-pay | Admitting: Internal Medicine

## 2016-02-05 DIAGNOSIS — Z87891 Personal history of nicotine dependence: Secondary | ICD-10-CM | POA: Insufficient documentation

## 2016-02-05 DIAGNOSIS — Z1211 Encounter for screening for malignant neoplasm of colon: Secondary | ICD-10-CM

## 2016-02-05 DIAGNOSIS — R29898 Other symptoms and signs involving the musculoskeletal system: Secondary | ICD-10-CM | POA: Insufficient documentation

## 2016-02-05 DIAGNOSIS — I159 Secondary hypertension, unspecified: Secondary | ICD-10-CM | POA: Diagnosis not present

## 2016-02-05 DIAGNOSIS — I340 Nonrheumatic mitral (valve) insufficiency: Secondary | ICD-10-CM | POA: Insufficient documentation

## 2016-02-05 DIAGNOSIS — I517 Cardiomegaly: Secondary | ICD-10-CM

## 2016-02-05 DIAGNOSIS — I119 Hypertensive heart disease without heart failure: Secondary | ICD-10-CM | POA: Diagnosis not present

## 2016-02-05 NOTE — Telephone Encounter (Signed)
Referral was ordered by Dr. Doreene Burke on 01/25/16 for Colonoscopy.

## 2016-02-05 NOTE — Progress Notes (Signed)
Colonoscopy ordered.

## 2016-02-09 ENCOUNTER — Other Ambulatory Visit: Payer: Self-pay | Admitting: Family Medicine

## 2016-02-09 ENCOUNTER — Telehealth: Payer: Self-pay | Admitting: Cardiovascular Disease

## 2016-02-09 ENCOUNTER — Other Ambulatory Visit: Payer: Self-pay | Admitting: Orthopedic Surgery

## 2016-02-09 DIAGNOSIS — R9430 Abnormal result of cardiovascular function study, unspecified: Secondary | ICD-10-CM

## 2016-02-09 NOTE — Telephone Encounter (Signed)
Not cleared for surgery. LVEF 25% Needs pharmacologic Myoview stress test then return office visit to further discuss

## 2016-02-09 NOTE — Telephone Encounter (Signed)
Left Jonathan Dickson a msg to call.

## 2016-02-09 NOTE — Telephone Encounter (Signed)
Please call,question about Dr Kennon Holter dictation note on 02-01-16.

## 2016-02-09 NOTE — Telephone Encounter (Signed)
Spoke to [place]. They need clearance notification for this patient for his laminectomy. Believe this was pending echo results.  Echo was done on 5/8 - routed to Dr. [name] for review/notes.

## 2016-02-12 NOTE — Telephone Encounter (Signed)
Is this ok to refill?  

## 2016-02-13 NOTE — Telephone Encounter (Signed)
Pt called back to schedule stress test-no order in-told pt will leave message for order for a stress test-pt states he has to have it done before surgery 02-28-16-I told him once we get the order we can schedule-he hung up on me-

## 2016-02-13 NOTE — Telephone Encounter (Signed)
Ordered entered in EPIC. Scheduling aware and will call the pt to schedule.

## 2016-02-20 ENCOUNTER — Emergency Department (HOSPITAL_COMMUNITY): Payer: Medicaid Other

## 2016-02-20 ENCOUNTER — Encounter (HOSPITAL_COMMUNITY): Payer: Self-pay | Admitting: Adult Health

## 2016-02-20 ENCOUNTER — Emergency Department (HOSPITAL_COMMUNITY)
Admission: EM | Admit: 2016-02-20 | Discharge: 2016-02-20 | Disposition: A | Discharge status: Home / Self Care | Payer: Medicaid Other | Attending: Emergency Medicine | Admitting: Emergency Medicine

## 2016-02-20 DIAGNOSIS — Z87891 Personal history of nicotine dependence: Secondary | ICD-10-CM | POA: Diagnosis not present

## 2016-02-20 DIAGNOSIS — I10 Essential (primary) hypertension: Secondary | ICD-10-CM | POA: Diagnosis not present

## 2016-02-20 DIAGNOSIS — Z880 Allergy status to penicillin: Secondary | ICD-10-CM | POA: Diagnosis not present

## 2016-02-20 DIAGNOSIS — R05 Cough: Secondary | ICD-10-CM | POA: Diagnosis not present

## 2016-02-20 DIAGNOSIS — R1011 Right upper quadrant pain: Secondary | ICD-10-CM

## 2016-02-20 DIAGNOSIS — F329 Major depressive disorder, single episode, unspecified: Secondary | ICD-10-CM | POA: Insufficient documentation

## 2016-02-20 DIAGNOSIS — R112 Nausea with vomiting, unspecified: Secondary | ICD-10-CM | POA: Insufficient documentation

## 2016-02-20 DIAGNOSIS — F419 Anxiety disorder, unspecified: Secondary | ICD-10-CM | POA: Diagnosis not present

## 2016-02-20 DIAGNOSIS — R0789 Other chest pain: Secondary | ICD-10-CM | POA: Diagnosis not present

## 2016-02-20 DIAGNOSIS — Z79899 Other long term (current) drug therapy: Secondary | ICD-10-CM | POA: Insufficient documentation

## 2016-02-20 DIAGNOSIS — R110 Nausea: Secondary | ICD-10-CM

## 2016-02-20 DIAGNOSIS — M1990 Unspecified osteoarthritis, unspecified site: Secondary | ICD-10-CM | POA: Diagnosis not present

## 2016-02-20 LAB — URINALYSIS, ROUTINE W REFLEX MICROSCOPIC
BILIRUBIN URINE: NEGATIVE
Glucose, UA: NEGATIVE mg/dL
KETONES UR: NEGATIVE mg/dL
LEUKOCYTES UA: NEGATIVE
NITRITE: NEGATIVE
PH: 5.5 (ref 5.0–8.0)
PROTEIN: NEGATIVE mg/dL
Specific Gravity, Urine: 1.017 (ref 1.005–1.030)

## 2016-02-20 LAB — COMPREHENSIVE METABOLIC PANEL
ALBUMIN: 4.2 g/dL (ref 3.5–5.0)
ALK PHOS: 71 U/L (ref 38–126)
ALT: 24 U/L (ref 17–63)
AST: 26 U/L (ref 15–41)
Anion gap: 9 (ref 5–15)
BILIRUBIN TOTAL: 0.8 mg/dL (ref 0.3–1.2)
BUN: 10 mg/dL (ref 6–20)
CALCIUM: 10.1 mg/dL (ref 8.9–10.3)
CO2: 24 mmol/L (ref 22–32)
CREATININE: 0.89 mg/dL (ref 0.61–1.24)
Chloride: 101 mmol/L (ref 101–111)
GFR calc Af Amer: >60 mL/min (ref >60)
GLUCOSE: 125 mg/dL — AB (ref 65–99)
POTASSIUM: 4.5 mmol/L (ref 3.5–5.1)
Sodium: 134 mmol/L — ABNORMAL LOW (ref 135–145)
TOTAL PROTEIN: 7.8 g/dL (ref 6.5–8.1)

## 2016-02-20 LAB — LIPASE, BLOOD: Lipase: 24 U/L (ref 11–51)

## 2016-02-20 LAB — URINE MICROSCOPIC-ADD ON: Squamous Epithelial / LPF: NONE SEEN

## 2016-02-20 LAB — CBC
HEMATOCRIT: 48.7 % (ref 39.0–52.0)
Hemoglobin: 16.5 g/dL (ref 13.0–17.0)
MCH: 28.3 pg (ref 26.0–34.0)
MCHC: 33.9 g/dL (ref 30.0–36.0)
MCV: 83.4 fL (ref 78.0–100.0)
PLATELETS: 210 10*3/uL (ref 150–400)
RBC: 5.84 MIL/uL — ABNORMAL HIGH (ref 4.22–5.81)
RDW: 12.5 % (ref 11.5–15.5)
WBC: 7.6 10*3/uL (ref 4.0–10.5)

## 2016-02-20 MED ORDER — ONDANSETRON 4 MG PO TBDP
4.0000 mg | ORAL_TABLET | Freq: Once | ORAL | Status: AC | PRN
  Administered 2016-02-20: 4 mg via ORAL

## 2016-02-20 MED ORDER — KETOROLAC TROMETHAMINE 30 MG/ML IJ SOLN
30.0000 mg | Freq: Once | INTRAMUSCULAR | Status: AC
Start: 2016-02-20 — End: 2016-02-20
  Administered 2016-02-20: 30 mg via INTRAVENOUS
  Filled 2016-02-20: qty 1

## 2016-02-20 MED ORDER — IBUPROFEN 400 MG PO TABS: 400.0000 mg | ORAL_TABLET | Freq: Four times a day (QID) | ORAL | Status: DC | PRN

## 2016-02-20 MED ORDER — SODIUM CHLORIDE 0.9 % IV BOLUS (SEPSIS)
1000.0000 mL | Freq: Once | INTRAVENOUS | Status: AC
  Administered 2016-02-20: 1000 mL via INTRAVENOUS

## 2016-02-20 MED ORDER — ACETAMINOPHEN 325 MG PO TABS
650.0000 mg | ORAL_TABLET | Freq: Once | ORAL | Status: AC
  Administered 2016-02-20: 650 mg via ORAL
  Filled 2016-02-20: qty 2

## 2016-02-20 MED ORDER — LISINOPRIL 10 MG PO TABS
10.0000 mg | ORAL_TABLET | Freq: Once | ORAL | Status: AC
Start: 2016-02-20 — End: 2016-02-20
  Administered 2016-02-20: 10 mg via ORAL
  Filled 2016-02-20: qty 1

## 2016-02-20 MED ORDER — ONDANSETRON 4 MG PO TBDP: 4.0000 mg | ORAL_TABLET | Freq: Two times a day (BID) | ORAL | Status: DC

## 2016-02-20 MED ORDER — ONDANSETRON 4 MG PO TBDP
ORAL_TABLET | ORAL | Status: DC
Start: 2016-02-20 — End: 2016-02-21
  Filled 2016-02-20: qty 1

## 2016-02-20 NOTE — ED Notes (Signed)
Back from [place]. Pt alert, interactive, resps e/u, speaking in clear complete sentences, c/o HA 5/10, side pain improving 4/10, also some sob, no dyspnea noted, (denies: dizziness or nausea), using urinal.

## 2016-02-20 NOTE — ED Notes (Signed)
Back from xray, no changes.  ?

## 2016-02-20 NOTE — ED Provider Notes (Signed)
She was started over to me at shift change pending.  UA and chest x-ray, which are both within normal parameters.  I reviewed the rest of his labs and ultrasound are within normal parameters.  Patient was given antiemetics and pain medication, i.e. Toradol in the emergency department with relief of his symptoms.  He will be discharged home with a prescription for Zofran as well as renewal of his ibuprofen prescription with insertion to follow-up with his primary care physician  Junius Creamer, NP 02/20/16 2140  Orlie Dakin, MD 02/20/16 [phone]

## 2016-02-20 NOTE — Discharge Instructions (Signed)
Abdominal Pain, Adult Many things can cause belly (abdominal) pain. Most times, the belly pain is not dangerous. Many cases of belly pain can be watched and treated at home. HOME CARE   Do not take medicines that help you go poop (laxatives) unless told to by your doctor.  Only take medicine as told by your doctor.  Eat or drink as told by your doctor. Your doctor will tell you if you should be on a special diet. GET HELP IF:  You do not know what is causing your belly pain.  You have belly pain while you are sick to your stomach (nauseous) or have runny poop (diarrhea).  You have pain while you pee or poop.  Your belly pain wakes you up at night.  You have belly pain that gets worse or better when you eat.  You have belly pain that gets worse when you eat fatty foods.  You have a fever. GET HELP RIGHT AWAY IF:   The pain does not go away within 2 hours.  You keep throwing up (vomiting).  The pain changes and is only in the right or left part of the belly.  You have bloody or tarry looking poop. MAKE SURE YOU:   Understand these instructions.  Will watch your condition.  Will get help right away if you are not doing well or get worse.   This information is not intended to replace advice given to you by your health care provider. Make sure you discuss any questions you have with your health care provider.   Document Released: 03/04/2008 Document Revised: 10/07/2014 Document Reviewed: 05/26/2013 Elsevier Interactive Patient Education [employer]. Today you were evaluated for your abdominal pain, nausea and vomiting.  Your chest x-ray and ultrasound are normal, your labs are all within normal parameters.  You have been given antiemetic and pain medication in the emergency department with relief of your symptoms.  You have been given prescriptions for the same medications that you can take as needed at home.  Please make an appointment with your primary care physician  for follow-up as needed

## 2016-02-20 NOTE — ED Provider Notes (Signed)
CSN: [id number]     Arrival date & time 02/20/16  1542 History   First MD Initiated Contact with Patient 02/20/16 1752     Chief Complaint  Patient presents with  . Abdominal Pain   Patient is a 60 y.o. male presenting with abdominal pain.  Abdominal Pain Pain location:  RUQ Pain quality: sharp and stabbing   Pain radiates to:  Does not radiate Pain severity:  Moderate Duration:  1 day Timing:  Constant Progression:  Unchanged Context: not eating, not previous surgeries and not trauma   Relieved by: Heating pad. Worsened by:  Movement Ineffective treatments:  None tried Associated symptoms: cough, nausea and vomiting   Associated symptoms: no chills, no constipation, no diarrhea, no dysuria, no fever, no hematemesis, no hematochezia, no hematuria, no melena and no shortness of breath    Jonathan Dickson is a 60 year old male with PMHx of HTN, anxiety, depression and arthritis presenting with abdominal pain. Patient reports onset of symptoms yesterday. The pain is located in the right upper quadrant. He describes it as a sharp and stabbing pain. The pain has been constant since onset. He states that laying on a heating pad alleviates the pain. Sitting up, twisting his torso and palpation exacerbates the pain. He has a chronic dry cough that also exacerbates his abdominal pain. He endorses associated nausea and vomiting. He states that he tried to take ibuprofen and his blood pressure medications this morning but immediately vomited them back up. He received Zofran in triage and reports this has improved his nausea. Denies associated constipation or diarrhea. Denies history of gallbladder disease. Denies abdominal surgical history. Denies trauma to the area. Denies fevers, chills, URI symptoms, chest pain, shortness of breath, flank pain, dysuria, hematuria or difficulty urinating.  Past Medical History  Diagnosis Date  . Hypertension   . Seizures ([place])     with penicillin  . Depression   .  Anxiety   . Headache   . Arthritis    Past Surgical History  Procedure Laterality Date  . Thumb surgery Right    History reviewed. No pertinent family history. Social History  Substance Use Topics  . Smoking status: Former Smoker -- 1.50 packs/day for 8 years    Quit date: 03/15/2003  . Smokeless tobacco: None  . Alcohol Use: No     Comment: none since 2004    Review of Systems  Constitutional: Negative for fever and chills.  Respiratory: Positive for cough. Negative for shortness of breath.   Gastrointestinal: Positive for nausea, vomiting and abdominal pain. Negative for diarrhea, constipation, melena, hematochezia and hematemesis.  Genitourinary: Negative for dysuria and hematuria.  All other systems reviewed and are negative.     Allergies  Peanut-containing drug products and Penicillins  Home Medications   Prior to Admission medications   Medication Sig Start Date End Date Taking? Authorizing Provider  gabapentin (NEURONTIN) 300 MG capsule TAKE ONE CAPSULE BY MOUTH AT BEDTIME 02/13/16  Yes Gerre Pebbles, MD  ibuprofen (ADVIL,MOTRIN) 400 MG tablet Take 400 mg by mouth every 6 (six) hours as needed for mild pain.   Yes Historical Provider, MD  lisinopril (PRINIVIL,ZESTRIL) 10 MG tablet Take 30 mg by mouth every morning.    Yes Historical Provider, MD  methocarbamol (ROBAXIN) 500 MG tablet Take 500 mg by mouth 2 (two) times daily as needed for muscle spasms.   Yes Historical Provider, MD  traMADol (ULTRAM) 50 MG tablet Take 50 mg by mouth every 12 (twelve) hours  as needed for moderate pain.   Yes Historical Provider, MD   BP 189/116 mmHg  Pulse 88  Temp(Src) 98.4 F (36.9 C) (Oral)  Resp 16  Ht 5\' 6"  (1.676 m)  Wt 65.772 kg  BMI 23.41 kg/m2  SpO2 97% Physical Exam  Constitutional: He appears well-developed and well-nourished. No distress.  Nontoxic-appearing  HENT:  Head: Normocephalic and atraumatic.  Eyes: Conjunctivae are normal. Right eye exhibits no  discharge. Left eye exhibits no discharge. No scleral icterus.  Neck: Normal range of motion.  Cardiovascular: Normal rate, regular rhythm and normal heart sounds.   Pulmonary/Chest: Effort normal and breath sounds normal. No respiratory distress. He has no wheezes. He has no rales. He exhibits tenderness.  Generalized tenderness in the right upper quadrant and right lateral and inferior ribs. No bony deformities. Lungs clear to auscultation bilaterally.  Abdominal: There is tenderness in the right upper quadrant. There is no rigidity, no rebound and no guarding.  Tenderness to palpation in the right upper quadrant and right inferior and lateral ribs. No rebound or guarding.  Musculoskeletal: Normal range of motion.  Neurological: He is alert. Coordination normal.  Skin: Skin is warm and dry.  Psychiatric: He has a normal mood and affect. His behavior is normal.  Nursing note and vitals reviewed.   ED Course  Procedures (including critical care time) Labs Review Labs Reviewed  COMPREHENSIVE METABOLIC PANEL - Abnormal; Notable for the following:    Sodium 134 (*)    Glucose, Bld 125 (*)    All other components within normal limits  CBC - Abnormal; Notable for the following:    RBC 5.84 (*)    All other components within normal limits  LIPASE, BLOOD  URINALYSIS, ROUTINE W REFLEX MICROSCOPIC (NOT AT [facility])    Imaging Review US Abdomen Limited Ruq  02/20/2016  CLINICAL DATA:  Right upper quadrant pain starting yesterday EXAM: US ABDOMEN LIMITED - RIGHT UPPER QUADRANT COMPARISON:  None. FINDINGS: Gallbladder: No gallstones are noted within gallbladder. Gallbladder wall polyps the largest measures 4 mm. No sonographic Murphy's sign. No thickening of gallbladder wall. Common bile duct: Diameter: 3 mm in diameter within normal limits. Liver: No focal lesion identified. Within normal limits in parenchymal echogenicity. IMPRESSION: 1. No gallstones are noted within gallbladder. Gallbladder wall  polyp measures 4 mm. No sonographic Murphy's sign. No pericholecystic fluid. Normal CBD. Electronically Signed   By: Lahoma Crocker M.D.   On: 02/20/2016 19:06   I have personally reviewed and evaluated these images and lab results as part of my medical decision-making.   EKG Interpretation None      MDM   Final diagnoses:  RUQ pain   60 year old male presenting with right upper quadrant abdominal pain with associated nausea and vomiting 1 day. Afebrile. Noted to the mildly hypertensive and 160s/90s. Patient confirms that he was unable to take his blood pressure medication today due to nausea and vomiting. Dose of home blood pressure medicines given in emergency department. Patient is nontoxic-appearing. Mild tenderness to palpation in the right upper quadrant without peritoneal signs. Tenderness is generalized over the right lateral and inferior ribs as well. No bony deformities of the ribs. Patient denies trauma to the area but does note a chronic cough. Blood work is largely unremarkable. Ultrasound of the right upper quadrant in a straight no gallstones but incidentally shows a gallbladder polyp. No signs of cholecystitis. Toradol and 1 L bolus given in emergency department which patient states has improved his pain and  he is feeling better. Given pain dish Grecian over the right inferior ribs, a rib x-ray was obtained. Patient's symptoms seem musculoskeletal in origin. Pain is improved with a heating pad and worsened with direct contact. Patient care signed out to oncoming provider, Junius Creamer, NP, pending rib Xray, UA and further monitoring of symptoms in ED. If negative xray and symptoms sufficiently controlled, anticipate discharge home with antiemetics and NSAIDs and close PCP follow up as needed.     Lahoma Crocker Anginette Espejo, PA-C 02/20/16 2026  Orlie Dakin, MD 02/20/16 [phone]

## 2016-02-20 NOTE — ED Notes (Signed)
Presents with right upper quadrant pain began yesterday described as a knife jabbing in his side associated with nausea and vomiting-lying down with a heating pad makes pain better, sitting up, walking and movement make pain worse.

## 2016-02-21 ENCOUNTER — Telehealth (HOSPITAL_BASED_OUTPATIENT_CLINIC_OR_DEPARTMENT_OTHER): Payer: Self-pay | Admitting: Emergency Medicine

## 2016-02-21 ENCOUNTER — Telehealth: Payer: Self-pay | Admitting: Cardiovascular Disease

## 2016-02-21 ENCOUNTER — Telehealth (HOSPITAL_COMMUNITY): Payer: Self-pay

## 2016-02-21 NOTE — Telephone Encounter (Signed)
No answer. Left message to call back.   

## 2016-02-21 NOTE — Telephone Encounter (Signed)
New message   Pt is calling to speak to a rn about medication   When he has the stress test  When can he take his medications like his BP medications

## 2016-02-21 NOTE — Telephone Encounter (Signed)
Encounter complete. 

## 2016-02-22 ENCOUNTER — Encounter (HOSPITAL_COMMUNITY)
Admission: RE | Admit: 2016-02-22 | Discharge: 2016-02-22 | Disposition: A | Discharge status: Home / Self Care | Payer: Medicaid Other | Source: Ambulatory Visit | Attending: Orthopedic Surgery | Admitting: Orthopedic Surgery

## 2016-02-22 ENCOUNTER — Encounter (HOSPITAL_COMMUNITY): Payer: Self-pay

## 2016-02-22 DIAGNOSIS — Z01812 Encounter for preprocedural laboratory examination: Secondary | ICD-10-CM | POA: Diagnosis present

## 2016-02-22 LAB — CBC WITH DIFFERENTIAL/PLATELET
Basophils Absolute: 0 10*3/uL (ref 0.0–0.1)
Basophils Relative: 0 %
Eosinophils Absolute: 0.1 10*3/uL (ref 0.0–0.7)
Eosinophils Relative: 2 %
HEMATOCRIT: 45.1 % (ref 39.0–52.0)
HEMOGLOBIN: 15.3 g/dL (ref 13.0–17.0)
LYMPHS ABS: 3 10*3/uL (ref 0.7–4.0)
LYMPHS PCT: 49 %
MCH: 28.4 pg (ref 26.0–34.0)
MCHC: 33.9 g/dL (ref 30.0–36.0)
MCV: 83.7 fL (ref 78.0–100.0)
MONOS PCT: 6 %
Monocytes Absolute: 0.4 10*3/uL (ref 0.1–1.0)
NEUTROS ABS: 2.6 10*3/uL (ref 1.7–7.7)
NEUTROS PCT: 43 %
Platelets: 193 10*3/uL (ref 150–400)
RBC: 5.39 MIL/uL (ref 4.22–5.81)
RDW: 12.4 % (ref 11.5–15.5)
WBC: 6.1 10*3/uL (ref 4.0–10.5)

## 2016-02-22 LAB — COMPREHENSIVE METABOLIC PANEL
ALK PHOS: 63 U/L (ref 38–126)
ALT: 23 U/L (ref 17–63)
ANION GAP: 7 (ref 5–15)
AST: 29 U/L (ref 15–41)
Albumin: 3.9 g/dL (ref 3.5–5.0)
BILIRUBIN TOTAL: 0.7 mg/dL (ref 0.3–1.2)
BUN: 8 mg/dL (ref 6–20)
CALCIUM: 9.2 mg/dL (ref 8.9–10.3)
CO2: 27 mmol/L (ref 22–32)
CREATININE: 0.93 mg/dL (ref 0.61–1.24)
Chloride: 101 mmol/L (ref 101–111)
GFR calc non Af Amer: >60 mL/min (ref >60)
GLUCOSE: 96 mg/dL (ref 65–99)
Potassium: 3.9 mmol/L (ref 3.5–5.1)
Sodium: 135 mmol/L (ref 135–145)
TOTAL PROTEIN: 6.9 g/dL (ref 6.5–8.1)

## 2016-02-22 LAB — URINALYSIS, ROUTINE W REFLEX MICROSCOPIC
BILIRUBIN URINE: NEGATIVE
GLUCOSE, UA: NEGATIVE mg/dL
HGB URINE DIPSTICK: NEGATIVE
Ketones, ur: NEGATIVE mg/dL
Leukocytes, UA: NEGATIVE
Nitrite: NEGATIVE
Protein, ur: NEGATIVE mg/dL
SPECIFIC GRAVITY, URINE: 1.01 (ref 1.005–1.030)
pH: 6 (ref 5.0–8.0)

## 2016-02-22 LAB — PROTIME-INR
INR: 1 (ref 0.00–1.49)
Prothrombin Time: 13.4 seconds (ref 11.6–15.2)

## 2016-02-22 LAB — APTT: aPTT: 28 seconds (ref 24–37)

## 2016-02-22 LAB — SURGICAL PCR SCREEN
MRSA, PCR: NEGATIVE
Staphylococcus aureus: NEGATIVE

## 2016-02-22 NOTE — Telephone Encounter (Signed)
New Message  Pt requested to speak with RN concerning BP medicine. Prior to [facility] appt on 5/26.

## 2016-02-22 NOTE — Telephone Encounter (Signed)
Returned call to pt. He wanted to see if he could take his lisinopril in the morning prior to the procedure. He was told yes he could with a small sip of water. It was also reiterated for the pt to be NPO after midnight and to not have any caffeinated or decaffeinated products within 12 hours prior to test. He verbalized understanding.

## 2016-02-22 NOTE — Pre-Procedure Instructions (Signed)
Jonathan Dickson  02/22/2016      [facility] [place], [place] - [address] [place] [address] Phone: [phone] Fax: [place], [place] [address] Phone: [phone] Fax: [phone]  [facility] [place], [place] [address] Phone: [phone] Fax: [phone]    Your procedure is scheduled on  Wednesday  02/28/16.  Report to [facility] Admitting at 630 A.M.  Call this number if you have problems the morning of surgery:  [phone]   Remember:  Do not eat food or drink liquids after midnight.  Take these medicines the morning of surgery with A SIP OF WATER   TRAMADOL IF NEEDED   (STOP 7 DAYS PRIOR TO SURGERY IBUPROFEN, ADVIL, MOTRIN, ASPIRIN , ASPIRIN PRODUCTS, GOODY POWDERS, BC'S ,HERBAL MEDICINES)   Do not wear jewelry, make-up or nail polish.  Do not wear lotions, powders, or perfumes.  You may wear deodorant.  Do not shave 48 hours prior to surgery.  Men may shave face and neck.  Do not bring valuables to the hospital.  [facility] is not responsible for any belongings or valuables.  Contacts, dentures or bridgework may not be worn into surgery.  Leave your suitcase in the car.  After surgery it may be brought to your room.  For patients admitted to the hospital, discharge time will be determined by your treatment team.  Patients discharged the day of surgery will not be allowed to drive home.   Name and phone number of your driver:    Special instructions:  [facility] - Preparing for Surgery  Before surgery, you can play an important role.  Because skin is not sterile, your skin needs to be as free of germs as possible.  You can reduce the number of germs on you skin by washing with CHG (chlorahexidine gluconate) soap before  surgery.  CHG is an antiseptic cleaner which kills germs and bonds with the skin to continue killing germs even after washing.  Please DO NOT use if you have an allergy to CHG or antibacterial soaps.  If your skin becomes reddened/irritated stop using the CHG and inform your nurse when you arrive at Short Stay.  Do not shave (including legs and underarms) for at least 48 hours prior to the first CHG shower.  You may shave your face.  Please follow these instructions carefully:   1.  Shower with CHG Soap the night before surgery and the                                morning of Surgery.  2.  If you choose to wash your hair, wash your hair first as usual with your       normal shampoo.  3.  After you shampoo, rinse your hair and body thoroughly to remove the                      Shampoo.  4.  Use CHG as you would any other liquid soap.  You can apply chg directly       to the skin and wash gently with scrungie or a clean washcloth.  5.  Apply the CHG Soap to your body ONLY FROM  THE NECK DOWN.        Do not use on open wounds or open sores.  Avoid contact with your eyes,       ears, mouth and genitals (private parts).  Wash genitals (private parts)       with your normal soap.  6.  Wash thoroughly, paying special attention to the area where your surgery        will be performed.  7.  Thoroughly rinse your body with warm water from the neck down.  8.  DO NOT shower/wash with your normal soap after using and rinsing off       the CHG Soap.  9.  Pat yourself dry with a clean towel.            10.  Wear clean pajamas.            11.  Place clean sheets on your bed the night of your first shower and do not        sleep with pets.  Day of Surgery  Do not apply any lotions/deoderants the morning of surgery.  Please wear clean clothes to the hospital/surgery center.    Please read over the following fact sheets that you were given. Pain Booklet, Coughing and Deep Breathing, MRSA Information and  Surgical Site Infection Prevention

## 2016-02-23 ENCOUNTER — Ambulatory Visit (HOSPITAL_COMMUNITY)
Admission: RE | Admit: 2016-02-23 | Discharge: 2016-02-23 | Disposition: A | Discharge status: Home / Self Care | Payer: Medicaid Other | Source: Ambulatory Visit | Attending: Cardiovascular Disease | Admitting: Cardiovascular Disease

## 2016-02-23 ENCOUNTER — Encounter (HOSPITAL_COMMUNITY): Payer: Self-pay | Admitting: Vascular Surgery

## 2016-02-23 DIAGNOSIS — Z87891 Personal history of nicotine dependence: Secondary | ICD-10-CM | POA: Diagnosis not present

## 2016-02-23 DIAGNOSIS — R9430 Abnormal result of cardiovascular function study, unspecified: Secondary | ICD-10-CM

## 2016-02-23 DIAGNOSIS — I119 Hypertensive heart disease without heart failure: Secondary | ICD-10-CM | POA: Diagnosis not present

## 2016-02-23 DIAGNOSIS — R0989 Other specified symptoms and signs involving the circulatory and respiratory systems: Secondary | ICD-10-CM

## 2016-02-23 LAB — MYOCARDIAL PERFUSION IMAGING
CHL CUP NUCLEAR SRS: 0
CHL CUP NUCLEAR SSS: 2
CSEPPHR: 106 {beats}/min
LV dias vol: 147 mL (ref 62–150)
LVSYSVOL: 114 mL
NUC STRESS TID: 0.92
Rest HR: 65 {beats}/min
SDS: 2

## 2016-02-23 MED ORDER — TECHNETIUM TC 99M TETROFOSMIN IV KIT
10.4000 | PACK | Freq: Once | INTRAVENOUS | Status: AC | PRN
  Administered 2016-02-23: 10 via INTRAVENOUS
  Filled 2016-02-23: qty 10

## 2016-02-23 MED ORDER — REGADENOSON 0.4 MG/5ML IV SOLN
0.4000 mg | Freq: Once | INTRAVENOUS | Status: AC
  Administered 2016-02-23: 0.4 mg via INTRAVENOUS

## 2016-02-23 MED ORDER — TECHNETIUM TC 99M TETROFOSMIN IV KIT
30.8000 | PACK | Freq: Once | INTRAVENOUS | Status: AC | PRN
  Administered 2016-02-23: 30.8 via INTRAVENOUS
  Filled 2016-02-23: qty 31

## 2016-02-27 ENCOUNTER — Telehealth: Payer: Self-pay | Admitting: Cardiovascular Disease

## 2016-02-27 NOTE — Telephone Encounter (Signed)
New message  Pt states that this is the third time that the test has been canceled. Pt states that he would like to know what has been seen on the stress test that he cannot have his surgery.

## 2016-02-27 NOTE — Telephone Encounter (Signed)
New emssage  Pt calling to speak w/ rN about myoview results. Please call back and discuss.

## 2016-02-27 NOTE — Telephone Encounter (Signed)
Returned call to patient. He was very frustrated with the fact that his surgery was cancelled again due to the results of the stress test. He had heard this information from his surgeon prior to [place] telling him the results and was annoyed by that. I explained that the test was done on Friday and the office was closed yesterday, so today was the first day that we were able to call him with this information. He is frustrated because he needs to have his surgery and cannot due to not being cleared and this has already happened 2 other times. I explained that the stress test showed him to be a high risk and he could not be cleared at this time. He has an appt on 03/06/16.  I sympathized with his frustration and asked if he had any other questions I could answer. He said, "I better keep my mouth shut at this time, thank you for your call."  Then he hung up.

## 2016-02-28 ENCOUNTER — Encounter (HOSPITAL_COMMUNITY): Admission: RE | Payer: Self-pay | Source: Ambulatory Visit

## 2016-02-28 ENCOUNTER — Ambulatory Visit (HOSPITAL_COMMUNITY): Admission: RE | Admit: 2016-02-28 | Payer: Medicaid Other | Source: Ambulatory Visit | Admitting: Orthopedic Surgery

## 2016-02-28 SURGERY — ANTERIOR CERVICAL DECOMPRESSION/DISCECTOMY FUSION 3 LEVELS
Anesthesia: General

## 2016-02-29 DIAGNOSIS — R7989 Other specified abnormal findings of blood chemistry: Secondary | ICD-10-CM

## 2016-02-29 HISTORY — DX: Other specified abnormal findings of blood chemistry: R79.89

## 2016-03-06 ENCOUNTER — Encounter: Payer: Self-pay | Admitting: Cardiovascular Disease

## 2016-03-06 ENCOUNTER — Other Ambulatory Visit: Payer: Self-pay | Admitting: *Deleted

## 2016-03-06 ENCOUNTER — Ambulatory Visit (INDEPENDENT_AMBULATORY_CARE_PROVIDER_SITE_OTHER): Payer: Medicaid Other | Admitting: Cardiovascular Disease

## 2016-03-06 VITALS — BP 128/80 | HR 82 | Ht 66.0 in | Wt 149.0 lb

## 2016-03-06 DIAGNOSIS — I519 Heart disease, unspecified: Secondary | ICD-10-CM

## 2016-03-06 DIAGNOSIS — I10 Essential (primary) hypertension: Secondary | ICD-10-CM

## 2016-03-06 DIAGNOSIS — Z79899 Other long term (current) drug therapy: Secondary | ICD-10-CM

## 2016-03-06 DIAGNOSIS — Z01818 Encounter for other preprocedural examination: Secondary | ICD-10-CM

## 2016-03-06 LAB — CBC WITH DIFFERENTIAL/PLATELET
BASOS PCT: 0 %
Basophils Absolute: 0 cells/uL (ref 0–200)
Eosinophils Absolute: 142 cells/uL (ref 15–500)
Eosinophils Relative: 2 %
HEMATOCRIT: 42.6 % (ref 38.5–50.0)
HEMOGLOBIN: 14.3 g/dL (ref 13.2–17.1)
LYMPHS ABS: 2556 {cells}/uL (ref 850–3900)
Lymphocytes Relative: 36 %
MCH: 28.5 pg (ref 27.0–33.0)
MCHC: 33.6 g/dL (ref 32.0–36.0)
MCV: 85 fL (ref 80.0–100.0)
MONO ABS: 426 {cells}/uL (ref 200–950)
MPV: 10.7 fL (ref 7.5–12.5)
Monocytes Relative: 6 %
Neutro Abs: 3976 cells/uL (ref 1500–7800)
Neutrophils Relative %: 56 %
Platelets: 211 10*3/uL (ref 140–400)
RBC: 5.01 MIL/uL (ref 4.20–5.80)
RDW: 14.1 % (ref 11.0–15.0)
WBC: 7.1 10*3/uL (ref 3.8–10.8)

## 2016-03-06 MED ORDER — CARVEDILOL 3.125 MG PO TABS: 3.1250 mg | ORAL_TABLET | Freq: Two times a day (BID) | ORAL | Status: DC

## 2016-03-06 NOTE — Patient Instructions (Addendum)
Medication Instructions:  Your physician has recommended you make the following change in your medication:  1 START carvedilol 3.125 mg (1 tablet) by mouth twice a day.   Testing/Procedures: Your physician has requested that you have a LEFT cardiac catheterization. Cardiac catheterization is used to diagnose and/or treat various heart conditions. Doctors may recommend this procedure for a number of different reasons. The most common reason is to evaluate chest pain. Chest pain can be a symptom of coronary artery disease (CAD), and cardiac catheterization can show whether plaque is narrowing or blocking your heart's arteries. This procedure is also used to evaluate the valves, as well as measure the blood flow and oxygen levels in different parts of your heart. For further information please visit [email or web address].  AS SOON AS POSSIBLE WITH DR BERRY.  Following your catheterization, you will not be allowed to drive for 3 days.  No lifting, pushing, or pulling greater that 10 pounds is allowed for 1 week.  You will be required to have the following tests prior to the procedure:  1. Blood work-the blood work can be done no more than 14 days prior to the procedure.  It can be done at any [facility] lab.  There is one downstairs on the first floor of this building and one in the [facility] building [phone]).   Puncture site right radial  Follow-up: Your physician recommends that you schedule a follow-up appointment WITH PHARMACIST IN BP CLINIC TO TITRATE YOUR BLOOD PRESSURE MEDICATION.  PLEASE KEEP A LOG OF YOUR BLOOD PRESSURE AND BRING THE READINGS AND BP CUFF TO YOUR APPOINTMENT WITH THE PHARMACIST.   Any Other Special Instructions Will Be Listed Below (If Applicable).     If you need a refill on your cardiac medications before your next appointment, please call your pharmacy.

## 2016-03-06 NOTE — Progress Notes (Signed)
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 I last saw him in the office 02/01/16 for preoperative clearance. He was scheduled to have cervical laminectomy today but this was put off because of need for cardiac clearance.His only risk factor is hypertension. Smoking 13 years ago. He's never had a heart attack or stroke. He denies chest pain or shortness of breath. I performed 2-D echocardiography which revealed unexpectedly ejection fraction of 25% with inferior wall akinesis and severe hypokinesia otherwise. A Myoview stress test confirmed the deep diminished ejection fraction but did not show any areas of ischemia or infarction. 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 In addition, I'm going to begin him on carvedilol and titrate him up to 25 mg twice a day. We will recheck a 2-D echo in 3 months and hopefully he'll have some improvement in LV function. Until that time, I'm going to delay his surgery cervical laminectomy.   Current Outpatient Prescriptions  Medication Sig Dispense Refill  . gabapentin (NEURONTIN) 300 MG capsule TAKE ONE CAPSULE BY MOUTH AT BEDTIME 30 capsule 0  . ibuprofen (ADVIL,MOTRIN) 400 MG tablet Take 1 tablet (400 mg total) by mouth every 6 (six) hours as needed for mild pain or moderate pain. 30 tablet 0  . lisinopril (PRINIVIL,ZESTRIL) 10 MG tablet Take 30 mg by mouth every morning.     . methocarbamol (ROBAXIN) 500 MG tablet Take 500 mg by mouth 2 (two) times daily as needed for muscle spasms.    . ondansetron (ZOFRAN-ODT) 4 MG disintegrating tablet Take 1 tablet (4 mg total) by mouth 2  (two) times daily. 20 tablet 0  . traMADol (ULTRAM) 50 MG tablet Take 50 mg by mouth every 12 (twelve) hours as needed for moderate pain.    . carvedilol (COREG) 3.125 MG tablet Take 1 tablet (3.125 mg total) by mouth 2 (two) times daily. 180 tablet 3   No current facility-administered medications for this visit.    Allergies  Allergen Reactions  . Peanut-Containing Drug Products Swelling  . Penicillins Other (See Comments)    Convulsions Has patient had a PCN reaction causing immediate rash, facial/tongue/throat swelling, SOB or lightheadedness with hypotension: No Has patient had a PCN reaction causing severe rash involving mucus membranes or skin necrosis: No Has patient had a PCN reaction that required hospitalization No Has patient had a PCN reaction occurring within the last 10 years: No If all of the above answers are "NO", then may proceed with Cephalosporin use.     Social History   Social History  . Marital Status: Legally Separated    Spouse Name: N/A  . Number of Children: N/A  . Years of Education: N/A   Occupational History  . Not on file.   Social History Main Topics  . Smoking status: Former Smoker -- 1.50 packs/day for 8 years    Quit date: 03/15/2003  . Smokeless tobacco: Not on file  . Alcohol Use: No     Comment: none since 2004  . Drug Use: No     Comment: former  none since 2004  . Sexual  Activity: Not on file   Other Topics Concern  . Not on file   Social History Narrative     Review of Systems: General: negative for chills, fever, night sweats or weight changes.  Cardiovascular: negative for chest pain, dyspnea on exertion, edema, orthopnea, palpitations, paroxysmal nocturnal dyspnea or shortness of breath Dermatological: negative for rash Respiratory: negative for cough or wheezing Urologic: negative for hematuria Abdominal: negative for nausea, vomiting, diarrhea, bright red blood per rectum, melena, or hematemesis Neurologic: negative  for visual changes, syncope, or dizziness All other systems reviewed and are otherwise negative except as noted above.    Blood pressure 128/80, pulse 82, height 5\' 6"  (1.676 m), weight 149 lb (67.586 kg).  General appearance: alert and no distress Neck: no adenopathy, no carotid bruit, no JVD, supple, symmetrical, trachea midline and thyroid not enlarged, symmetric, no tenderness/mass/nodules Lungs: clear to auscultation bilaterally Heart: regular rate and rhythm, S1, S2 normal, no murmur, click, rub or gallop Extremities: extremities normal, atraumatic, no cyanosis or edema  EKG not performed today  ASSESSMENT AND PLAN:   Left ventricular dysfunction [name] returns today for follow-up of his outpatient diagnostic tests done for preoperative clearance before elective cervical laminectomy. His ejection fraction was 25% with severe inferior hypokinesia and moderate global hypokinesia. His Myoview showed an EF of 22% as well without evidence of ischemia or scar suggesting nonischemic cardio myopathy. I suspect he has hypertensive heart disease causing his LV dysfunction although I cannot be sure and therefore have recommended a left heart cath to further cardiac characterize the etiology. I am going to begin him on carvedilol 3.125 mg twice a day and slowly titrate him up to 25 mg  twice a day REM blood pressure permitting. f we are not able to improve his LV function he may be a candidate for an ICD implantation for primary prevention.       Lorretta Harp MD FACP,FACC,FAHA, [facility] 03/06/2016 3:23 PM

## 2016-03-06 NOTE — Assessment & Plan Note (Signed)
Jonathan Dickson returns today for follow-up of his outpatient diagnostic tests done for preoperative clearance before elective cervical laminectomy. His ejection fraction was 25% with severe inferior hypokinesia and moderate global hypokinesia. His Myoview showed an EF of 22% as well without evidence of ischemia or scar suggesting nonischemic cardio myopathy. I suspect he has hypertensive heart disease causing his LV dysfunction although I cannot be sure and therefore have recommended a left heart cath to further cardiac characterize the etiology. I am going to begin him on carvedilol 3.125 mg twice a day and slowly titrate him up to 25 mg  twice a day REM blood pressure permitting. f we are not able to improve his LV function he may be a candidate for an ICD implantation for primary prevention.

## 2016-03-07 ENCOUNTER — Other Ambulatory Visit: Payer: Self-pay | Admitting: Internal Medicine

## 2016-03-07 ENCOUNTER — Telehealth: Payer: Self-pay | Admitting: Cardiovascular Disease

## 2016-03-07 DIAGNOSIS — I10 Essential (primary) hypertension: Secondary | ICD-10-CM

## 2016-03-07 LAB — BASIC METABOLIC PANEL
BUN: 11 mg/dL (ref 7–25)
CHLORIDE: 103 mmol/L (ref 98–110)
CO2: 21 mmol/L (ref 20–31)
Calcium: 9.3 mg/dL (ref 8.6–10.3)
Creat: 0.96 mg/dL (ref 0.70–1.25)
Glucose, Bld: 93 mg/dL (ref 65–99)
POTASSIUM: 4.3 mmol/L (ref 3.5–5.3)
Sodium: 137 mmol/L (ref 135–146)

## 2016-03-07 LAB — PROTIME-INR
INR: 0.89 (ref <1.50)
Prothrombin Time: 12.1 seconds (ref 11.6–15.2)

## 2016-03-07 LAB — TSH: TSH: 0.24 mIU/L — ABNORMAL LOW (ref 0.40–4.50)

## 2016-03-07 LAB — APTT: aPTT: 28 seconds (ref 24–37)

## 2016-03-07 MED ORDER — BLOOD PRESSURE MONITORING KIT: 1.0000 [IU] | PACK | Freq: Once | Status: DC

## 2016-03-07 NOTE — Telephone Encounter (Signed)
New message      Pt states Dr [name] want him to get a bp home monitor.  He want to know if he can get one with his medicaid?

## 2016-03-07 NOTE — Telephone Encounter (Signed)
Spoke w/ patient, he has no income at present and is asking if medicaid would cover the cost of a BP cuff.  Discussed options w pharmD -- medicaid may cover, best option is to send Rx in for this  BP kit Rx sent to pt's regular pharmacy. He is aware to call or have the pharmacy call if the Rx needs to be sent to a specialty pharmacy/DME vendor.

## 2016-03-08 ENCOUNTER — Telehealth: Payer: Self-pay | Admitting: Cardiovascular Disease

## 2016-03-08 ENCOUNTER — Telehealth: Payer: Self-pay | Admitting: Internal Medicine

## 2016-03-08 NOTE — Telephone Encounter (Signed)
Refilled lisinopril x 30 days - patient needs office visit for further refills

## 2016-03-08 NOTE — Telephone Encounter (Signed)
Patient called requesting a medication refill for Lisinopril. Patient uses [facility] on [address]. Please follow up.

## 2016-03-11 ENCOUNTER — Emergency Department (HOSPITAL_COMMUNITY): Payer: Medicaid Other

## 2016-03-11 ENCOUNTER — Other Ambulatory Visit: Payer: Self-pay

## 2016-03-11 ENCOUNTER — Encounter (HOSPITAL_COMMUNITY)
Admission: EM | Disposition: A | Discharge status: Home / Self Care | Payer: Self-pay | Source: Home / Self Care | Attending: Emergency Medicine

## 2016-03-11 ENCOUNTER — Encounter (HOSPITAL_COMMUNITY): Payer: Self-pay | Admitting: *Deleted

## 2016-03-11 ENCOUNTER — Observation Stay (HOSPITAL_COMMUNITY)
Admission: EM | Admit: 2016-03-11 | Discharge: 2016-03-12 | Disposition: A | Discharge status: Home / Self Care | Payer: Medicaid Other | Attending: Cardiovascular Disease | Admitting: Cardiovascular Disease

## 2016-03-11 DIAGNOSIS — Z91010 Allergy to peanuts: Secondary | ICD-10-CM | POA: Diagnosis not present

## 2016-03-11 DIAGNOSIS — M5080 Other cervical disc disorders, unspecified cervical region: Secondary | ICD-10-CM | POA: Diagnosis not present

## 2016-03-11 DIAGNOSIS — I209 Angina pectoris, unspecified: Principal | ICD-10-CM | POA: Insufficient documentation

## 2016-03-11 DIAGNOSIS — I428 Other cardiomyopathies: Secondary | ICD-10-CM

## 2016-03-11 DIAGNOSIS — R739 Hyperglycemia, unspecified: Secondary | ICD-10-CM | POA: Insufficient documentation

## 2016-03-11 DIAGNOSIS — I119 Hypertensive heart disease without heart failure: Secondary | ICD-10-CM

## 2016-03-11 DIAGNOSIS — I420 Dilated cardiomyopathy: Secondary | ICD-10-CM | POA: Diagnosis present

## 2016-03-11 DIAGNOSIS — I5043 Acute on chronic combined systolic (congestive) and diastolic (congestive) heart failure: Secondary | ICD-10-CM | POA: Insufficient documentation

## 2016-03-11 DIAGNOSIS — I110 Hypertensive heart disease with heart failure: Secondary | ICD-10-CM | POA: Diagnosis not present

## 2016-03-11 DIAGNOSIS — I208 Other forms of angina pectoris: Secondary | ICD-10-CM

## 2016-03-11 DIAGNOSIS — I200 Unstable angina: Secondary | ICD-10-CM

## 2016-03-11 DIAGNOSIS — F329 Major depressive disorder, single episode, unspecified: Secondary | ICD-10-CM | POA: Insufficient documentation

## 2016-03-11 DIAGNOSIS — Z880 Allergy status to penicillin: Secondary | ICD-10-CM | POA: Diagnosis not present

## 2016-03-11 DIAGNOSIS — I10 Essential (primary) hypertension: Secondary | ICD-10-CM | POA: Diagnosis present

## 2016-03-11 DIAGNOSIS — Z87891 Personal history of nicotine dependence: Secondary | ICD-10-CM | POA: Diagnosis not present

## 2016-03-11 DIAGNOSIS — R7989 Other specified abnormal findings of blood chemistry: Secondary | ICD-10-CM

## 2016-03-11 DIAGNOSIS — M1990 Unspecified osteoarthritis, unspecified site: Secondary | ICD-10-CM | POA: Insufficient documentation

## 2016-03-11 DIAGNOSIS — R079 Chest pain, unspecified: Secondary | ICD-10-CM

## 2016-03-11 HISTORY — DX: Cervical disc disorder, unspecified, unspecified cervical region: M50.90

## 2016-03-11 HISTORY — DX: Chronic combined systolic (congestive) and diastolic (congestive) heart failure: I50.42

## 2016-03-11 HISTORY — DX: Hypertensive heart disease without heart failure: I11.9

## 2016-03-11 HISTORY — DX: Other cardiomyopathies: I42.8

## 2016-03-11 HISTORY — DX: Other specified abnormal findings of blood chemistry: R79.89

## 2016-03-11 HISTORY — PX: CARDIAC CATHETERIZATION: SHX172

## 2016-03-11 LAB — CBC WITH DIFFERENTIAL/PLATELET
BASOS ABS: 0 10*3/uL (ref 0.0–0.1)
Basophils Relative: 1 %
EOS PCT: 3 %
Eosinophils Absolute: 0.2 10*3/uL (ref 0.0–0.7)
HEMATOCRIT: 41.3 % (ref 39.0–52.0)
Hemoglobin: 13.8 g/dL (ref 13.0–17.0)
LYMPHS PCT: 42 %
Lymphs Abs: 2.6 10*3/uL (ref 0.7–4.0)
MCH: 28.1 pg (ref 26.0–34.0)
MCHC: 33.4 g/dL (ref 30.0–36.0)
MCV: 84.1 fL (ref 78.0–100.0)
Monocytes Absolute: 0.4 10*3/uL (ref 0.1–1.0)
Monocytes Relative: 6 %
NEUTROS ABS: 3 10*3/uL (ref 1.7–7.7)
Neutrophils Relative %: 48 %
PLATELETS: 211 10*3/uL (ref 150–400)
RBC: 4.91 MIL/uL (ref 4.22–5.81)
RDW: 12.7 % (ref 11.5–15.5)
WBC: 6.1 10*3/uL (ref 4.0–10.5)

## 2016-03-11 LAB — COMPREHENSIVE METABOLIC PANEL
ALBUMIN: 3.3 g/dL — AB (ref 3.5–5.0)
ALT: 20 U/L (ref 17–63)
ANION GAP: 9 (ref 5–15)
AST: 35 U/L (ref 15–41)
Alkaline Phosphatase: 64 U/L (ref 38–126)
BUN: 13 mg/dL (ref 6–20)
CHLORIDE: 106 mmol/L (ref 101–111)
CO2: 20 mmol/L — AB (ref 22–32)
Calcium: 9.1 mg/dL (ref 8.9–10.3)
Creatinine, Ser: 1.07 mg/dL (ref 0.61–1.24)
GFR calc Af Amer: >60 mL/min (ref >60)
GFR calc non Af Amer: >60 mL/min (ref >60)
GLUCOSE: 124 mg/dL — AB (ref 65–99)
POTASSIUM: 4.3 mmol/L (ref 3.5–5.1)
SODIUM: 135 mmol/L (ref 135–145)
Total Bilirubin: 1.1 mg/dL (ref 0.3–1.2)
Total Protein: 6 g/dL — ABNORMAL LOW (ref 6.5–8.1)

## 2016-03-11 LAB — CBC
HCT: 42.8 % (ref 39.0–52.0)
Hemoglobin: 14.5 g/dL (ref 13.0–17.0)
MCH: 28.2 pg (ref 26.0–34.0)
MCHC: 33.9 g/dL (ref 30.0–36.0)
MCV: 83.1 fL (ref 78.0–100.0)
Platelets: 213 10*3/uL (ref 150–400)
RBC: 5.15 MIL/uL (ref 4.22–5.81)
RDW: 12.3 % (ref 11.5–15.5)
WBC: 5.2 10*3/uL (ref 4.0–10.5)

## 2016-03-11 LAB — HEPARIN LEVEL (UNFRACTIONATED): Heparin Unfractionated: 0.42 IU/mL (ref 0.30–0.70)

## 2016-03-11 LAB — BASIC METABOLIC PANEL
Anion gap: 7 (ref 5–15)
BUN: 12 mg/dL (ref 6–20)
CO2: 21 mmol/L — ABNORMAL LOW (ref 22–32)
CREATININE: 1.06 mg/dL (ref 0.61–1.24)
Calcium: 9.2 mg/dL (ref 8.9–10.3)
Chloride: 106 mmol/L (ref 101–111)
GFR calc Af Amer: >60 mL/min (ref >60)
Glucose, Bld: 128 mg/dL — ABNORMAL HIGH (ref 65–99)
POTASSIUM: 3.9 mmol/L (ref 3.5–5.1)
SODIUM: 134 mmol/L — AB (ref 135–145)

## 2016-03-11 LAB — TROPONIN I
Troponin I: 0.03 ng/mL (ref <0.031)
Troponin I: <0.03 ng/mL (ref <0.031)

## 2016-03-11 LAB — I-STAT TROPONIN, ED: TROPONIN I, POC: 0.01 ng/mL (ref 0.00–0.08)

## 2016-03-11 SURGERY — LEFT HEART CATH AND CORONARY ANGIOGRAPHY
Anesthesia: LOCAL

## 2016-03-11 MED ORDER — HEPARIN BOLUS VIA INFUSION
4000.0000 [IU] | Freq: Once | INTRAVENOUS | Status: AC
  Administered 2016-03-11: 4000 [IU] via INTRAVENOUS
  Filled 2016-03-11: qty 4000

## 2016-03-11 MED ORDER — HEPARIN SODIUM (PORCINE) 5000 UNIT/ML IJ SOLN
5000.0000 [IU] | Freq: Three times a day (TID) | INTRAMUSCULAR | Status: DC
  Administered 2016-03-11 – 2016-03-12 (×2): 5000 [IU] via SUBCUTANEOUS
  Filled 2016-03-11 (×2): qty 1

## 2016-03-11 MED ORDER — SODIUM CHLORIDE 0.9 % IV SOLN: 250.0000 mL | INTRAVENOUS | Status: DC | PRN

## 2016-03-11 MED ORDER — NITROGLYCERIN 0.4 MG SL SUBL: 0.4000 mg | SUBLINGUAL_TABLET | SUBLINGUAL | Status: DC | PRN

## 2016-03-11 MED ORDER — TRAMADOL HCL 50 MG PO TABS
50.0000 mg | ORAL_TABLET | Freq: Two times a day (BID) | ORAL | Status: DC
  Administered 2016-03-11 – 2016-03-12 (×3): 50 mg via ORAL
  Filled 2016-03-11 (×3): qty 1

## 2016-03-11 MED ORDER — SODIUM CHLORIDE 0.9% FLUSH
3.0000 mL | Freq: Two times a day (BID) | INTRAVENOUS | Status: DC
  Administered 2016-03-11: 3 mL via INTRAVENOUS

## 2016-03-11 MED ORDER — HEPARIN SODIUM (PORCINE) 1000 UNIT/ML IJ SOLN
INTRAMUSCULAR | Status: DC | PRN
  Administered 2016-03-11: 3500 [IU] via INTRAVENOUS

## 2016-03-11 MED ORDER — SODIUM CHLORIDE 0.9% FLUSH: 3.0000 mL | INTRAVENOUS | Status: DC | PRN

## 2016-03-11 MED ORDER — LIDOCAINE HCL (PF) 1 % IJ SOLN
INTRAMUSCULAR | Status: DC | PRN
  Administered 2016-03-11: 3 mL

## 2016-03-11 MED ORDER — LISINOPRIL 20 MG PO TABS
30.0000 mg | ORAL_TABLET | Freq: Every day | ORAL | Status: DC
  Administered 2016-03-11 – 2016-03-12 (×2): 30 mg via ORAL
  Filled 2016-03-11 (×2): qty 1

## 2016-03-11 MED ORDER — NITROGLYCERIN IN D5W 200-5 MCG/ML-% IV SOLN
0.0000 ug/min | Freq: Once | INTRAVENOUS | Status: AC
Start: 2016-03-11 — End: 2016-03-11
  Administered 2016-03-11: 5 ug/min via INTRAVENOUS
  Filled 2016-03-11: qty 250

## 2016-03-11 MED ORDER — CARVEDILOL 3.125 MG PO TABS
3.1250 mg | ORAL_TABLET | Freq: Two times a day (BID) | ORAL | Status: DC
  Administered 2016-03-11 – 2016-03-12 (×2): 3.125 mg via ORAL
  Filled 2016-03-11 (×2): qty 1

## 2016-03-11 MED ORDER — LIDOCAINE HCL (PF) 1 % IJ SOLN
INTRAMUSCULAR | Status: AC
  Filled 2016-03-11: qty 30

## 2016-03-11 MED ORDER — MIDAZOLAM HCL 2 MG/2ML IJ SOLN
INTRAMUSCULAR | Status: AC
  Filled 2016-03-11: qty 2

## 2016-03-11 MED ORDER — HEPARIN (PORCINE) IN NACL 2-0.9 UNIT/ML-% IJ SOLN
INTRAMUSCULAR | Status: DC | PRN
  Administered 2016-03-11: 8 mL via INTRA_ARTERIAL

## 2016-03-11 MED ORDER — IOPAMIDOL (ISOVUE-370) INJECTION 76%
INTRAVENOUS | Status: DC | PRN
  Administered 2016-03-11: 70 mL via INTRA_ARTERIAL

## 2016-03-11 MED ORDER — IBUPROFEN 200 MG PO TABS
400.0000 mg | ORAL_TABLET | Freq: Four times a day (QID) | ORAL | Status: DC | PRN
  Administered 2016-03-12: 400 mg via ORAL
  Filled 2016-03-11: qty 2

## 2016-03-11 MED ORDER — HEPARIN (PORCINE) IN NACL 2-0.9 UNIT/ML-% IJ SOLN
INTRAMUSCULAR | Status: DC | PRN
  Administered 2016-03-11: 1000 mL

## 2016-03-11 MED ORDER — METHOCARBAMOL 500 MG PO TABS
500.0000 mg | ORAL_TABLET | Freq: Two times a day (BID) | ORAL | Status: DC
  Administered 2016-03-11 – 2016-03-12 (×3): 500 mg via ORAL
  Filled 2016-03-11 (×3): qty 1

## 2016-03-11 MED ORDER — MIDAZOLAM HCL 2 MG/2ML IJ SOLN
INTRAMUSCULAR | Status: DC | PRN
  Administered 2016-03-11: 1 mg via INTRAVENOUS

## 2016-03-11 MED ORDER — GABAPENTIN 300 MG PO CAPS
300.0000 mg | ORAL_CAPSULE | Freq: Two times a day (BID) | ORAL | Status: DC
  Administered 2016-03-11 – 2016-03-12 (×3): 300 mg via ORAL
  Filled 2016-03-11 (×3): qty 1

## 2016-03-11 MED ORDER — SODIUM CHLORIDE 0.9 % WEIGHT BASED INFUSION: 1.0000 mL/kg/h | INTRAVENOUS | Status: AC

## 2016-03-11 MED ORDER — SODIUM CHLORIDE 0.9% FLUSH
3.0000 mL | Freq: Two times a day (BID) | INTRAVENOUS | Status: DC
Start: 2016-03-11 — End: 2016-03-12
  Administered 2016-03-11 – 2016-03-12 (×2): 3 mL via INTRAVENOUS

## 2016-03-11 MED ORDER — ASPIRIN 81 MG PO CHEW
81.0000 mg | CHEWABLE_TABLET | ORAL | Status: AC
  Administered 2016-03-11: 81 mg via ORAL
  Filled 2016-03-11: qty 1

## 2016-03-11 MED ORDER — FENTANYL CITRATE (PF) 100 MCG/2ML IJ SOLN
INTRAMUSCULAR | Status: AC
  Filled 2016-03-11: qty 2

## 2016-03-11 MED ORDER — SODIUM CHLORIDE 0.9 % WEIGHT BASED INFUSION: 3.0000 mL/kg/h | INTRAVENOUS | Status: DC

## 2016-03-11 MED ORDER — HEPARIN SODIUM (PORCINE) 1000 UNIT/ML IJ SOLN
INTRAMUSCULAR | Status: AC
  Filled 2016-03-11: qty 1

## 2016-03-11 MED ORDER — VERAPAMIL HCL 2.5 MG/ML IV SOLN
INTRAVENOUS | Status: AC
  Filled 2016-03-11: qty 2

## 2016-03-11 MED ORDER — HEPARIN (PORCINE) IN NACL 100-0.45 UNIT/ML-% IJ SOLN
1000.0000 [IU]/h | INTRAMUSCULAR | Status: DC
  Administered 2016-03-11: 1000 [IU]/h via INTRAVENOUS
  Filled 2016-03-11: qty 250

## 2016-03-11 MED ORDER — IOPAMIDOL (ISOVUE-370) INJECTION 76%
INTRAVENOUS | Status: AC
  Filled 2016-03-11: qty 100

## 2016-03-11 MED ORDER — ONDANSETRON HCL 4 MG/2ML IJ SOLN
4.0000 mg | Freq: Four times a day (QID) | INTRAMUSCULAR | Status: DC | PRN
  Administered 2016-03-11: 4 mg via INTRAVENOUS
  Filled 2016-03-11: qty 2

## 2016-03-11 MED ORDER — HEPARIN (PORCINE) IN NACL 2-0.9 UNIT/ML-% IJ SOLN
INTRAMUSCULAR | Status: AC
  Filled 2016-03-11: qty 1000

## 2016-03-11 MED ORDER — SODIUM CHLORIDE 0.9 % IV SOLN
INTRAVENOUS | Status: DC
Start: 2016-03-11 — End: 2016-03-11
  Administered 2016-03-11: 06:00:00 via INTRAVENOUS

## 2016-03-11 MED ORDER — FENTANYL CITRATE (PF) 100 MCG/2ML IJ SOLN
INTRAMUSCULAR | Status: DC | PRN
  Administered 2016-03-11: 25 ug via INTRAVENOUS

## 2016-03-11 MED ORDER — ACETAMINOPHEN 325 MG PO TABS
650.0000 mg | ORAL_TABLET | ORAL | Status: DC | PRN
  Administered 2016-03-12: 650 mg via ORAL
  Filled 2016-03-11 (×2): qty 2

## 2016-03-11 MED ORDER — ASPIRIN EC 81 MG PO TBEC
81.0000 mg | DELAYED_RELEASE_TABLET | Freq: Every day | ORAL | Status: DC
  Administered 2016-03-12: 81 mg via ORAL
  Filled 2016-03-11: qty 1

## 2016-03-11 MED ORDER — SODIUM CHLORIDE 0.9 % WEIGHT BASED INFUSION
1.0000 mL/kg/h | INTRAVENOUS | Status: DC
  Administered 2016-03-11: 1 mL/kg/h via INTRAVENOUS

## 2016-03-11 MED ORDER — ASPIRIN 81 MG PO CHEW
324.0000 mg | CHEWABLE_TABLET | ORAL | Status: DC
  Filled 2016-03-11: qty 4

## 2016-03-11 SURGICAL SUPPLY — 8 items
CATH OPTITORQUE TIG 4.0 5F (CATHETERS) ×2 IMPLANT
DEVICE RAD COMP TR BAND LRG (VASCULAR PRODUCTS) ×2 IMPLANT
GLIDESHEATH SLEND A-KIT 6F 22G (SHEATH) ×2 IMPLANT
KIT HEART LEFT (KITS) ×2 IMPLANT
PACK CARDIAC CATHETERIZATION (CUSTOM PROCEDURE TRAY) ×2 IMPLANT
TRANSDUCER W/STOPCOCK (MISCELLANEOUS) ×2 IMPLANT
TUBING CIL FLEX 10 FLL-RA (TUBING) ×2 IMPLANT
WIRE SAFE-T 1.5MM-J .035X260CM (WIRE) ×2 IMPLANT

## 2016-03-11 NOTE — Interval H&P Note (Signed)
History and Physical Interval Note:  03/11/2016 5:08 PM  Jonathan Dickson  has presented today for surgery, with the diagnosis of unstable angina with new diagnosis of cardiomyopathy, reduced ejection fraction.  The various methods of treatment have been discussed with the patient and family. After consideration of risks, benefits and other options for treatment, the patient has consented to  Procedure(s): Left Heart Cath and Coronary Angiography (N/A) With Possible Percutaneous Coronary Intervention as a surgical intervention .  The patient's history has been reviewed, patient examined, no change in status, stable for surgery.  I have reviewed the patient's chart and labs.  Questions were answered to the patient's satisfaction.    Cath Lab Visit (complete for each Cath Lab visit)  Clinical Evaluation Leading to the Procedure:   ACS: Yes.    Non-ACS:    Anginal Classification: CCS III  Anti-ischemic medical therapy: Maximal Therapy (2 or more classes of medications)  Non-Invasive Test Results: High-risk stress test findings: cardiac mortality >3%/year - reduced EF  Prior CABG: No previous CABG  TIMI SCORE  Patient Information:  TIMI Score is 2  UA/NSTEMI and low-risk features (e.g., TIMI score  Revascularization of the presumed culprit artery   U (6)  Indication: 9; Score: 6    Jonathan Dickson

## 2016-03-11 NOTE — Progress Notes (Signed)
ANTICOAGULATION CONSULT NOTE - Initial Consult  Pharmacy Consult for Heparin  Indication: chest pain/ACS  Allergies  Allergen Reactions  . Peanut-Containing Drug Products Swelling  . Penicillins Other (See Comments)    Convulsions Has patient had a PCN reaction causing immediate rash, facial/tongue/throat swelling, SOB or lightheadedness with hypotension: No Has patient had a PCN reaction causing severe rash involving mucus membranes or skin necrosis: No Has patient had a PCN reaction that required hospitalization No Has patient had a PCN reaction occurring within the last 10 years: No If all of the above answers are "NO", then may proceed with Cephalosporin use.    Patient Measurements: Height: 5\' 6"  (167.6 cm) Weight: 147 lb 4.8 oz (66.815 kg) IBW/kg (Calculated) : 63.8  Vital Signs: Temp: 98.1 F (36.7 C) (06/12 1249) Temp Source: Oral (06/12 1249) BP: 130/80 mmHg (06/12 1249) Pulse Rate: 87 (06/12 1249)   Assessment: 59 YOM on heparin gtt for ACS. Heparin level = 0.42, therapeutic on 1000 units/hr.  plan for cath today. hgb 13.8, pltc 211K.  Goal of Therapy:  Heparin level 0.3-0.7 units/ml Monitor platelets by anticoagulation protocol: Yes   Plan:  -Continue heparin 1000 units/hr -Daily CBC/HL -Monitor for bleeding -f/u after cath   Manley Mason 03/11/2016,1:31 PM  -------------------------- Addendum 1:31 PM CBC/BMET good JLL

## 2016-03-11 NOTE — Progress Notes (Signed)
Report received via Valarie Merino RN from the ER dept, reviewed orders, labs, VS, meds and patient's general condition, will place patient in 3W-20 and room is ready.

## 2016-03-11 NOTE — H&P (View-Only) (Signed)
For cath today.  [name], MD    

## 2016-03-11 NOTE — ED Provider Notes (Signed)
CSN: [id number]     Arrival date & time 03/11/16  0038 History  By signing my name below, I, Arianna Nassar, attest that this documentation has been prepared under the direction and in the presence of Merryl Hacker, MD.  Electronically Signed: Julien Nordmann, ED Scribe. 03/11/2016. 1:02 AM.    Chief Complaint  Patient presents with  . Chest Pain      The history is provided by the patient. No language interpreter was used.   HPI Comments: Jonathan Dickson is a 60 y.o. male who has a PMHx of HTN, arthritis, depression and anxiety presents to the Emergency Department by [facility] complaining of sudden onset, gradual worsening, constant, 4/10, moderate chest pain onset this evening. He has associated shortness of breath and a moderate cough that started a few days ago. Pt states he was laying in bed when he felt sudden pressure-like chest pain. Pt says he has been having minor problems with his BP so he took it and it was higher than normal at 181/100. He took 1, 325 mg of aspirin at home before EMS arrived.Pt received two nitro's from EMS with relief. Pt is scheduled to have a cardiac catheterizathion on June 19th. He denies fever. He is former smoker.   Patient had a high-risk stress test by Dr. Quay Burow and is scheduled for cardiac catheterization on the 19th.  Past Medical History  Diagnosis Date  . Hypertension   . Seizures ([place])     with penicillin  . Depression   . Anxiety   . Arthritis    Past Surgical History  Procedure Laterality Date  . Thumb surgery Right    No family history on file. Social History  Substance Use Topics  . Smoking status: Former Smoker -- 1.50 packs/day for 8 years    Quit date: 03/15/2003  . Smokeless tobacco: None  . Alcohol Use: No     Comment: none since 2004    Review of Systems  Constitutional: Negative for fever.  Respiratory: Positive for cough and shortness of breath.   Cardiovascular: Positive for chest pain.  Gastrointestinal:  Negative for nausea and vomiting.  All other systems reviewed and are negative.     Allergies  Peanut-containing drug products and Penicillins  Home Medications   Prior to Admission medications   Medication Sig Start Date End Date Taking? Authorizing Provider  carvedilol (COREG) 3.125 MG tablet Take 1 tablet (3.125 mg total) by mouth 2 (two) times daily. 03/06/16  Yes Lorretta Harp, MD  gabapentin (NEURONTIN) 300 MG capsule TAKE ONE CAPSULE BY MOUTH AT BEDTIME Patient taking differently: TAKE ONE CAPSULE BY MOUTH TWICE DAILY. 02/13/16  Yes Gerre Pebbles, MD  ibuprofen (ADVIL,MOTRIN) 400 MG tablet Take 1 tablet (400 mg total) by mouth every 6 (six) hours as needed for mild pain or moderate pain. 02/20/16  Yes Junius Creamer, NP  lisinopril (PRINIVIL,ZESTRIL) 30 MG tablet Take 1 tablet (30 mg total) by mouth daily. Must have OV 03/08/16  Yes Tresa Garter, MD  methocarbamol (ROBAXIN) 500 MG tablet Take 500 mg by mouth 2 (two) times daily.    Yes Historical Provider, MD  traMADol (ULTRAM) 50 MG tablet Take 50 mg by mouth 2 (two) times daily.    Yes Historical Provider, MD   Triage vitals: BP 132/83 mmHg  Pulse 104  Temp(Src) 97.9 F (36.6 C) (Oral)  Resp 20  Ht 5\' 6"  (1.676 m)  Wt 150 lb (68.04 kg)  BMI 24.22 kg/m2  SpO2  97% Physical Exam  Constitutional: He is oriented to person, place, and time. He appears well-developed and well-nourished. No distress.  HENT:  Head: Normocephalic and atraumatic.  Eyes: Pupils are equal, round, and reactive to light.  Cardiovascular: Normal rate, regular rhythm and normal heart sounds.   No murmur heard. Pulmonary/Chest: Effort normal and breath sounds normal. No respiratory distress. He has no wheezes.  Abdominal: Soft. There is no tenderness.  Musculoskeletal: He exhibits no edema.  Neurological: He is alert and oriented to person, place, and time.  Skin: Skin is warm and dry.  Psychiatric: He has a normal mood and affect.  Nursing note  and vitals reviewed.   ED Course  Procedures   CRITICAL CARE Performed by: Merryl Hacker   Total critical care time: 25 minutes  Critical care time was exclusive of separately billable procedures and treating other patients.  Critical care was necessary to treat or prevent imminent or life-threatening deterioration.  Critical care was time spent personally by me on the following activities: development of treatment plan with patient and/or surrogate as well as nursing, discussions with consultants, evaluation of patient's response to treatment, examination of patient, obtaining history from patient or surrogate, ordering and performing treatments and interventions, ordering and review of laboratory studies, ordering and review of radiographic studies, pulse oximetry and re-evaluation of patient's condition.  DIAGNOSTIC STUDIES: Oxygen Saturation is 97% on RA, normal by my interpretation.  COORDINATION OF CARE:  1:01 AM Discussed treatment plan which includes cardiac work up with pt at bedside and pt agreed to plan. Pt was told he will be admitted to the hospital for further evaluation.  Labs Review Labs Reviewed  BASIC METABOLIC PANEL - Abnormal; Notable for the following:    Sodium 134 (*)    CO2 21 (*)    Glucose, Bld 128 (*)    All other components within normal limits  CBC  HEPARIN LEVEL (UNFRACTIONATED)  I-STAT TROPOININ, ED    Imaging Review No results found. I have personally reviewed and evaluated these images and lab results as part of my medical decision-making.   EKG Interpretation   Date/Time:  Monday March 11 2016 00:39:21 EDT Ventricular Rate:  93 PR Interval:  156 QRS Duration: 100 QT Interval:  364 QTC Calculation: 453 R Axis:   68 Text Interpretation:  Sinus rhythm LAE, consider biatrial enlargement  Borderline T wave abnormalities ST elev, probable normal early repol  pattern No significant change since last tracing Confirmed by Kritika Stukes  MD,   Gleen Ripberger ([id number]) on 03/11/2016 12:48:59 AM      MDM   Final diagnoses:  Angina at rest [facility])    Patient presents with chest pain. Somewhat improved with nitroglycerin. Has hypertension and former smoking as risk factors. Current pain is 4 out of 10. He has artery take a full dose aspirin. Scheduled for cardiac catheterization after a high-risk stress test. Patient was placed on heparin and nitroglycerin drips. Pain improved to 2 out of 10. Initial troponin is negative. EKG is unchanged from prior. Given high-risk stress test and plan for cardiac catheterization next week, will admit for further management to cardiology. Discussed with Dr. Wynonia Lawman who will evaluate the patient.  I personally performed the services described in this documentation, which was scribed in my presence. The recorded information has been reviewed and is accurate.   Merryl Hacker, MD 03/11/16 [phone]

## 2016-03-11 NOTE — Progress Notes (Signed)
For cath today.  Jeff Robin Pafford, MD    

## 2016-03-11 NOTE — Plan of Care (Signed)
Problem: Phase I Progression Outcomes Goal: Pain controlled with appropriate interventions Outcome: Progressing Patient is on a Heparin drip and a NTG drip with no c/o chest pain at this time but will give him his baby ASA as per orders and start his NSS IVF's and continue to monitor. Patient is watching the heart cath video on TV and I also explained what was going to happen.

## 2016-03-11 NOTE — Plan of Care (Signed)
Problem: Safety: Goal: Ability to remain free from injury will improve Outcome: Progressing Reviewed unit rules and showed him how to use phone to call RN/NT and to call out, what the white board is and not to get OOB without assistance. Patient is aware of the bed alarm but is refusing to have it on at this time and was instructed to stay in bed especially with the medications that he is on and he verbalized understanding.

## 2016-03-11 NOTE — Progress Notes (Addendum)
ANTICOAGULATION CONSULT NOTE - Initial Consult  Pharmacy Consult for Heparin  Indication: chest pain/ACS  Allergies  Allergen Reactions  . Peanut-Containing Drug Products Swelling  . Penicillins Other (See Comments)    Convulsions Has patient had a PCN reaction causing immediate rash, facial/tongue/throat swelling, SOB or lightheadedness with hypotension: No Has patient had a PCN reaction causing severe rash involving mucus membranes or skin necrosis: No Has patient had a PCN reaction that required hospitalization No Has patient had a PCN reaction occurring within the last 10 years: No If all of the above answers are "NO", then may proceed with Cephalosporin use.    Patient Measurements: Height: 5\' 6"  (167.6 cm) Weight: 150 lb (68.04 kg) IBW/kg (Calculated) : 63.8  Vital Signs: Temp: 97.9 F (36.6 C) (06/12 0044) Temp Source: Oral (06/12 0044) BP: 132/83 mmHg (06/12 0044) Pulse Rate: 104 (06/12 0044)   Medical History: Past Medical History  Diagnosis Date  . Hypertension   . Seizures ([place])     with penicillin  . Depression   . Anxiety   . Arthritis     Assessment: Heparin for CP, labs collected and pending, no anti-coagulants PTA  Goal of Therapy:  Heparin level 0.3-0.7 units/ml Monitor platelets by anticoagulation protocol: Yes   Plan:  -Heparin 4000 units BOLUS -Start heparin drip at 1000 units/hr -1000 HL -Daily CBC/HL -Monitor for bleeding -F/U CBC/BMET  Roza Creamer 03/11/2016,1:20 AM  -------------------------- Addendum 2:00 AM CBC/BMET good JLL

## 2016-03-11 NOTE — H&P (Signed)
History and Physical   Admit date: 03/11/2016 Name:  Jonathan Dickson Medical record number: [id number] DOB/Age:  [birth year]  59 y.o. male  Referring Physician:   [facility]   Primary Cardiologist:  Dr. Quay Burow  Chief complaint/reason for admission: Chest pain   HPI:  This 60 year old black male has a history of previous polysubstance abuse but none in a few years.  He has known hypertension.  He had what was thought to be a cardiac contusion in 2012 and had some elevation of his blood pressure then.  He recently developed cervical disc disease and was in need of a surgical laminectomy and was sent for cardiac evaluation.  He was found to have a low ejection fraction on echocardiogram and had a stress test with Myoview showing an EF of 22% and diffuse hypokinesis.  He saw Dr. Alvester Chou back and catheterization was advised to exclude significant coronary artery disease.  This was scheduled for a week from now.  He was told to get a blood pressure cuff which she didn't he has been monitoring his blood pressures.  He was somewhat elevated today and then this evening he had midsternal chest pain became frightened and came to the emergency room where he was given nitroglycerin with some relief of the pain.  He was started on IV nitroglycerin.  There were no acute changes on EKG.  Initial troponin was normal.  He normally denies PND, orthopnea, syncope, or claudication.    Past Medical History  Diagnosis Date  . Hypertension   . Seizures ([place])     with penicillin  . Depression   . Anxiety   . Cervical disc disease      Past Surgical History  Procedure Laterality Date  . Thumb surgery Right    Allergies: is allergic to peanut-containing drug products and penicillins.   Medications: Prior to Admission medications   Medication Sig Start Date End Date Taking? Authorizing Provider  carvedilol (COREG) 3.125 MG tablet Take 1 tablet (3.125 mg total) by mouth 2 (two) times daily.  03/06/16  Yes Lorretta Harp, MD  gabapentin (NEURONTIN) 300 MG capsule TAKE ONE CAPSULE BY MOUTH AT BEDTIME Patient taking differently: TAKE ONE CAPSULE BY MOUTH TWICE DAILY. 02/13/16  Yes Gerre Pebbles, MD  ibuprofen (ADVIL,MOTRIN) 400 MG tablet Take 1 tablet (400 mg total) by mouth every 6 (six) hours as needed for mild pain or moderate pain. 02/20/16  Yes Junius Creamer, NP  lisinopril (PRINIVIL,ZESTRIL) 30 MG tablet Take 1 tablet (30 mg total) by mouth daily. Must have OV 03/08/16  Yes Tresa Garter, MD  methocarbamol (ROBAXIN) 500 MG tablet Take 500 mg by mouth 2 (two) times daily.    Yes Historical Provider, MD  traMADol (ULTRAM) 50 MG tablet Take 50 mg by mouth 2 (two) times daily.    Yes Historical Provider, MD   Family History:  No early family history of cardiac disease  Social History:   reports that he quit smoking about 13 years ago. He does not have any smokeless tobacco history on file. He reports that he does not drink alcohol or use illicit drugs.    Review of Systems: Other than as noted above, the remainder of the review of systems is normal  Physical Exam: BP 121/103 mmHg  Pulse 96  Temp(Src) 97.9 F (36.6 C) (Oral)  Resp 25  Ht 5\' 6"  (1.676 m)  Wt 68.04 kg (150 lb)  BMI 24.22 kg/m2  SpO2 97% General appearance: Pleasant  black male in no acute distress Head: Normocephalic, without obvious abnormality, atraumatic Eyes: conjunctivae/corneas clear. PERRL, EOM's intact. Fundi not examined  Neck: no adenopathy, no carotid bruit, no JVD and supple, symmetrical, trachea midline Lungs: clear to auscultation bilaterally Heart: regular rate and rhythm, S1, S2 normal, no murmur, click, rub or gallop Abdomen: soft, non-tender; bowel sounds normal; no masses,  no organomegaly Rectal: deferred Extremities: extremities normal, atraumatic, no cyanosis or edema Pulses: 2+ and symmetric Skin: Skin color, texture, turgor normal. No rashes or lesions Neurologic: Grossly  normal   Labs: CBC  Recent Labs  03/11/16 0115  WBC 5.2  RBC 5.15  HGB 14.5  HCT 42.8  PLT 213  MCV 83.1  MCH 28.2  MCHC 33.9  RDW 12.3   CMP   Recent Labs  03/11/16 0115  NA 134*  K 3.9  CL 106  CO2 21*  GLUCOSE 128*  BUN 12  CREATININE 1.06  CALCIUM 9.2  GFRNONAA >60  GFRAA >60    EKG: Sinus rhythm, left atrial enlargement, nonspecific ST changes  Radiology: Clear lungs   IMPRESSIONS: 1.  Chest pain consistent with unstable angina 2.  Recent high risk stress test with abnormal ejection fraction of 25% rule out ischemic cardiomyopathy 3.  Hypertensive heart disease  4.  Cervical disc disease  PLAN: We will plan to move the catheterization up because of the chest pain.  Begin IV heparin.  Keep nothing by mouth for catheterization the morning.  The catheterization was already discussed with him and he understands the risks.  Signed: Kerry Hough MD [facility]  03/11/2016, 2:03 AM

## 2016-03-11 NOTE — ED Notes (Signed)
Pt arrives via EMS from home. Pt awoke from sleep with chest pain, checked his BP and found it to be 180/110. Called EMS. EMS gave 325 ASA and 2 NTG. Pain went from 7/10 to 2/10. Pt states he recently had a stress test and had a small MI during it. States he has a heart cath scheduled for 03/19/16.

## 2016-03-12 ENCOUNTER — Telehealth: Payer: Self-pay | Admitting: Cardiovascular Disease

## 2016-03-12 ENCOUNTER — Encounter (HOSPITAL_COMMUNITY): Payer: Self-pay | Admitting: Cardiology

## 2016-03-12 DIAGNOSIS — R739 Hyperglycemia, unspecified: Secondary | ICD-10-CM

## 2016-03-12 DIAGNOSIS — I5043 Acute on chronic combined systolic (congestive) and diastolic (congestive) heart failure: Secondary | ICD-10-CM

## 2016-03-12 DIAGNOSIS — I10 Essential (primary) hypertension: Secondary | ICD-10-CM

## 2016-03-12 DIAGNOSIS — I209 Angina pectoris, unspecified: Secondary | ICD-10-CM

## 2016-03-12 DIAGNOSIS — I119 Hypertensive heart disease without heart failure: Secondary | ICD-10-CM

## 2016-03-12 DIAGNOSIS — I428 Other cardiomyopathies: Secondary | ICD-10-CM

## 2016-03-12 DIAGNOSIS — I420 Dilated cardiomyopathy: Secondary | ICD-10-CM | POA: Diagnosis not present

## 2016-03-12 DIAGNOSIS — R079 Chest pain, unspecified: Secondary | ICD-10-CM

## 2016-03-12 DIAGNOSIS — R7989 Other specified abnormal findings of blood chemistry: Secondary | ICD-10-CM

## 2016-03-12 DIAGNOSIS — I429 Cardiomyopathy, unspecified: Secondary | ICD-10-CM

## 2016-03-12 MED ORDER — HYDROCHLOROTHIAZIDE 25 MG PO TABS
25.0000 mg | ORAL_TABLET | Freq: Every day | ORAL | Status: DC
  Administered 2016-03-12: 25 mg via ORAL
  Filled 2016-03-12: qty 1

## 2016-03-12 MED ORDER — HYDROCHLOROTHIAZIDE 25 MG PO TABS: 25.0000 mg | ORAL_TABLET | Freq: Every day | ORAL | Status: DC

## 2016-03-12 MED ORDER — CARVEDILOL 6.25 MG PO TABS: 6.2500 mg | ORAL_TABLET | Freq: Two times a day (BID) | ORAL | Status: DC

## 2016-03-12 MED ORDER — LIVING BETTER WITH HEART FAILURE BOOK
Freq: Once | Status: AC
  Administered 2016-03-12: 12:00:00

## 2016-03-12 NOTE — Plan of Care (Signed)
Problem: Phase I Progression Outcomes Goal: Vascular site scale level 0 - I Vascular Site Scale Level 0: No bruising/bleeding/hematoma Level I (Mild): Bruising/Ecchymosis, minimal bleeding/ooozing, palpable hematoma < 3 cm Level II (Moderate): Bleeding not affecting hemodynamic parameters, pseudoaneurysm, palpable hematoma > 3 cm Level III (Severe) Bleeding which affects hemodynamic parameters or retroperitoneal hemorrhage  Outcome: Progressing Right radial site remains clean, dry and intact with no s/s of complications. TR band removed at 2200 and pressure dressing applied and patient instructed when to call for medical assistance, will continue to monitor.

## 2016-03-12 NOTE — Plan of Care (Signed)
Problem: Consults Goal: Chest Pain Patient Education (See Patient Education module for education specifics.)  Outcome: Progressing Patient watched the heart cath video and reading material was given to him along with explanation and all questions were answered at this time, will continue to monitor

## 2016-03-12 NOTE — Plan of Care (Signed)
Problem: Phase I Progression Outcomes Goal: Hemodynamically stable Outcome: Progressing VSS, site remains clean, dry and intact with no hematoma, tenderness at the site and skin color, temp, sensation remain WNL, will contine to monitor. Right radial site continues to remain a Level 0 even after TR band removed with VSS.

## 2016-03-12 NOTE — Discharge Summary (Signed)
Discharge Summary    Patient ID: Jonathan Dickson,  MRN: [id number], DOB/AGE: 04/14/1956 60 y.o.  Admit date: 03/11/2016 Discharge date: 03/12/2016  Primary Care Provider: Lance Bosch Primary Cardiologist: Dr. [name]  Discharge Diagnoses    Principal Problem:   Chest pain Active Problems:   Essential hypertension   Congestive dilated cardiomyopathy ([place])   Acute on chronic combined systolic and diastolic congestive heart failure (HCC)   NICM (nonischemic cardiomyopathy) (HCC)   Abnormal TSH   Hyperglycemia   Hypertensive heart disease    Diagnostic Studies/Procedures    1. Cardiac catheterization this admission, please see full report and below for summary. _____________   History of Present Illness & Hospital Course    [name] is a 60 y/o M with history of previous polysubstance abuse, HTN, cardiac contusion 2012, cervical disc disease, and recently diagnosed cardiomyopathy who presented to [facility] with chest pain. He was recently evaluated by Dr. [name] for pre-op eval and had an echo 02/05/16 showing EF 25% with akinesis inferior wall, severe HK elsewhere, grade 1 DD, mild MR. He subsequently underwent nuc 02/23/16 to further evaluate; it was high risk due to low EF, otherwise no ischemia. Outpatient cath was planned but before this could be performed the patient was admitted after he became frightened with an episode of chest pain. He ruled out for MI. He underwent LHC showing angiographically normal cors, LVEF ~35-45 (visually closer to 40-45%), mild-moderately elevated LVEDP. Gentle diuresis was suggested and thus Dr. [name] started him on HCTZ. His carvedilol was also titrated for improved BP control. The patient was educated regarding daily weights, low salt diet and fluid restriction. The plan tentatively is to re-eval LVEF 3 months out if patient complies with optimal med regimen before clearing for laminectomy. Dr. [name] has seen and examined the patient today and feels he is  stable for discharge. Would consider BMET at f/u appt.  Of note he had recently abnormal TSH, as well as borderline hyperglycemia in the hospital. He has been instructed to f/u PCP for these things. He says his PCP is actually no longer practicing at [facility] and he is working on getting an appt with a new doctor. If this is still pending at time of follow-up, would consider re-drawing TSH and free T4 along with A1C so that these are available to his PCP in follow-up.   Consultants: N/A _____________  Discharge Vitals Blood pressure 138/91, pulse 88, temperature 98.3 F (36.8 C), temperature source Oral, resp. rate 25, height 5\' 6"  (1.676 m), weight 145 lb 4.8 oz (65.908 kg), SpO2 90 %.  Filed Weights   03/11/16 0044 03/11/16 0456 03/12/16 0525  Weight: 150 lb (68.04 kg) 147 lb 4.8 oz (66.815 kg) 145 lb 4.8 oz (65.908 kg)    Labs & Radiologic Studies    CBC  Recent Labs  03/11/16 0115 03/11/16 0338  WBC 5.2 6.1  NEUTROABS  --  3.0  HGB 14.5 13.8  HCT 42.8 41.3  MCV 83.1 84.1  PLT 213 [id number]   Basic Metabolic Panel  Recent Labs  03/11/16 0115 03/11/16 0338  NA 134* 135  K 3.9 4.3  CL 106 106  CO2 21* 20*  GLUCOSE 128* 124*  BUN 12 13  CREATININE 1.06 1.07  CALCIUM 9.2 9.1   Liver Function Tests  Recent Labs  03/11/16 0338  AST 35  ALT 20  ALKPHOS 64  BILITOT 1.1  PROT 6.0*  ALBUMIN 3.3*   Cardiac Enzymes  Recent  Labs  03/11/16 0338 03/11/16 0918 03/11/16 1501  TROPONINI 0.03 <0.03 <0.03  _____________  Dg Chest 2 View  03/11/2016  CLINICAL DATA:  Acute onset of mid chest pain and shortness of breath. Initial encounter. EXAM: CHEST  2 VIEW COMPARISON:  Chest radiograph performed 02/20/2016 FINDINGS: The lungs are well-aerated. Mild vascular congestion is noted. Mild bibasilar atelectasis is seen. There is no evidence of pleural effusion or pneumothorax. The heart is mildly enlarged. No acute osseous abnormalities are seen. IMPRESSION: Mild vascular  congestion and mild cardiomegaly noted. Mild bibasilar atelectasis seen. Electronically Signed   By: Garald Balding M.D.   On: 03/11/2016 01:55   Dg Ribs Unilateral W/chest Right  02/20/2016  CLINICAL DATA:  Right anterior rib pain beginning yesterday which shortness-of-breath. No injury. EXAM: RIGHT RIBS AND CHEST - 3+ VIEW COMPARISON:  01/23/2016 and 10/22/2012 FINDINGS: Lungs hypoinflated with minimal vascular crowding over the infrahilar regions unchanged. No definite consolidation or effusion. Mild stable cardiomegaly. No definite rib fracture. Mild degenerative change of the spine. IMPRESSION: No acute findings. Mild cardiomegaly. Electronically Signed   By: Marin Olp M.D.   On: 02/20/2016 20:49   US Abdomen Limited Ruq  02/20/2016  CLINICAL DATA:  Right upper quadrant pain starting yesterday EXAM: US ABDOMEN LIMITED - RIGHT UPPER QUADRANT COMPARISON:  None. FINDINGS: Gallbladder: No gallstones are noted within gallbladder. Gallbladder wall polyps the largest measures 4 mm. No sonographic Murphy's sign. No thickening of gallbladder wall. Common bile duct: Diameter: 3 mm in diameter within normal limits. Liver: No focal lesion identified. Within normal limits in parenchymal echogenicity. IMPRESSION: 1. No gallstones are noted within gallbladder. Gallbladder wall polyp measures 4 mm. No sonographic Murphy's sign. No pericholecystic fluid. Normal CBD. Electronically Signed   By: Lahoma Crocker M.D.   On: 02/20/2016 19:06   Disposition   Pt is being discharged home today in good condition.  Follow-up Plans & Appointments    Follow-up Information    Follow up with [facility].   Specialty:  Cardiology   Why:  [place] will see Jonathan Bayley, NP for your post-hospital visit on 03/21/16 at [place] information:   [address] [place] [facility] [phone]     Discharge Instructions    Diet - low sodium heart  healthy    Complete by:  As directed      Increase activity slowly    Complete by:  As directed   Your carvedilol dose was increased (new prescription). You were started on hydrochlorothiazide once daily (a medicine to help with fluid retention).  No driving for 2 days. No lifting over 5 lbs for 1 week. No sexual activity for 1 week. Keep procedure site clean & dry. If you notice increased pain, swelling, bleeding or pus, call/return!  You may shower, but no soaking baths/hot tubs/pools for 1 week.           Discharge Medications   Current Discharge Medication List    START taking these medications   Details  hydrochlorothiazide (HYDRODIURIL) 25 MG tablet Take 1 tablet (25 mg total) by mouth daily. Qty: 30 tablet, Refills: 6      CONTINUE these medications which have CHANGED   Details  carvedilol (COREG) 6.25 MG tablet Take 1 tablet (6.25 mg total) by mouth 2 (two) times daily with a meal. Qty: 60 tablet, Refills: 6      CONTINUE these medications which have NOT CHANGED  Details  gabapentin (NEURONTIN) 300 MG capsule Take 300 mg by mouth 2 (two) times daily.    ibuprofen (ADVIL,MOTRIN) 400 MG tablet Take 1 tablet (400 mg total) by mouth every 6 (six) hours as needed for mild pain or moderate pain.     lisinopril (PRINIVIL,ZESTRIL) 30 MG tablet Take 1 tablet (30 mg total) by mouth daily. Must have OV     methocarbamol (ROBAXIN) 500 MG tablet Take 500 mg by mouth 2 (two) times daily.     traMADol (ULTRAM) 50 MG tablet Take 50 mg by mouth 2 (two) times daily.          Allergies:  Allergies  Allergen Reactions  . Peanut-Containing Drug Products Swelling  . Penicillins Other (See Comments)    Convulsions Has patient had a PCN reaction causing immediate rash, facial/tongue/throat swelling, SOB or lightheadedness with hypotension: No Has patient had a PCN reaction causing severe rash involving mucus membranes or skin necrosis: No Has patient had a PCN reaction that  required hospitalization No Has patient had a PCN reaction occurring within the last 10 years: No If all of the above answers are "NO", then may proceed with Cephalosporin use.     Outstanding Labs/Studies   Recommend BMET at f/u if patient compliant with new med  Duration of Discharge Encounter   Greater than 30 minutes including physician time.  Signed, Charlie Pitter PA-C 03/12/2016, 11:16 AM

## 2016-03-12 NOTE — Plan of Care (Signed)
Problem: Safety: Goal: Ability to remain free from injury will improve Outcome: Completed/Met Date Met:  03/12/16 Patient uses call light and phone as instructed and waits for assistance when needed     

## 2016-03-12 NOTE — Plan of Care (Signed)
Problem: Consults Goal: Nutrition Consult-if indicated Outcome: Progressing Patient is aware that he will need to adjust his diet and start reading labels to look for sodium and fat content, reviewed some foods hgh in sodium, will continue to review and answer questions but patient verbalized understanding at this time

## 2016-03-12 NOTE — Plan of Care (Signed)
Problem: Phase I Progression Outcomes Goal: Distal pulses equal to baseline Outcome: Progressing Pulses and skin color, temp and sensation remain WNL, will continue to monitor

## 2016-03-12 NOTE — Plan of Care (Signed)
Problem: Pain Managment: Goal: General experience of comfort will improve Outcome: Progressing Patient has denied chest pain since admission to this unit and since his NTG drip is off is now free of headache pain but he know scale, radiation, duration and when to call

## 2016-03-12 NOTE — Progress Notes (Signed)
Patient: Jonathan Dickson / Admit Date: 03/11/2016 / Date of Encounter: 03/12/2016, 10:47 AM   Subjective: Feeling well. No further CP. No SOB or orthopnea.   Objective: Telemetry: NSR Physical Exam: Blood pressure 138/91, pulse 88, temperature 98.3 F (36.8 C), temperature source Oral, resp. rate 25, height 5\' 6"  (1.676 m), weight 145 lb 4.8 oz (65.908 kg), SpO2 90 %. General: Well developed, well nourished AAM in no acute distress. Head: Normocephalic, atraumatic, sclera non-icteric, no xanthomas, nares are without discharge. Neck: Negative for carotid bruits. JVP not elevated. Lungs: Clear bilaterally to auscultation without wheezes, rales, or rhonchi. Breathing is unlabored. Heart: RRR S1 S2 without murmurs, rubs, or gallops.  Abdomen: Soft, non-tender, non-distended with normoactive bowel sounds. No rebound/guarding. Extremities: No clubbing or cyanosis. No edema. Distal pedal pulses are 2+ and equal bilaterally. Right radial cath site without hematoma or ecchymosis; good pulse. Neuro: Alert and oriented X 3. Moves all extremities spontaneously. Psych:  Responds to questions appropriately with a normal affect.   Intake/Output Summary (Last 24 hours) at 03/12/16 1047 Last data filed at 03/12/16 0855  Gross per 24 hour  Intake   1168 ml  Output   2200 ml  Net  -1032 ml    Inpatient Medications:  . aspirin EC  81 mg Oral Daily  . carvedilol  3.125 mg Oral BID WC  . gabapentin  300 mg Oral BID  . heparin  5,000 Units Subcutaneous Q8H  . lisinopril  30 mg Oral Daily  . methocarbamol  500 mg Oral BID  . sodium chloride flush  3 mL Intravenous Q12H  . traMADol  50 mg Oral BID   Infusions:    Labs:  Recent Labs  03/11/16 0115 03/11/16 0338  NA 134* 135  K 3.9 4.3  CL 106 106  CO2 21* 20*  GLUCOSE 128* 124*  BUN 12 13  CREATININE 1.06 1.07  CALCIUM 9.2 9.1    Recent Labs  03/11/16 0338  AST 35  ALT 20  ALKPHOS 64  BILITOT 1.1  PROT 6.0*  ALBUMIN 3.3*     Recent Labs  03/11/16 0115 03/11/16 0338  WBC 5.2 6.1  NEUTROABS  --  3.0  HGB 14.5 13.8  HCT 42.8 41.3  MCV 83.1 84.1  PLT 213 211    Recent Labs  03/11/16 0338 03/11/16 0918 03/11/16 1501  TROPONINI 0.03 <0.03 <0.03   Invalid input(s): POCBNP No results for input(s): HGBA1C in the last 72 hours.   Radiology/Studies:  Dg Chest 2 View  03/11/2016  CLINICAL DATA:  Acute onset of mid chest pain and shortness of breath. Initial encounter. EXAM: CHEST  2 VIEW COMPARISON:  Chest radiograph performed 02/20/2016 FINDINGS: The lungs are well-aerated. Mild vascular congestion is noted. Mild bibasilar atelectasis is seen. There is no evidence of pleural effusion or pneumothorax. The heart is mildly enlarged. No acute osseous abnormalities are seen. IMPRESSION: Mild vascular congestion and mild cardiomegaly noted. Mild bibasilar atelectasis seen. Electronically Signed   By: Garald Balding M.D.   On: 03/11/2016 01:55   Dg Ribs Unilateral W/chest Right  02/20/2016  CLINICAL DATA:  Right anterior rib pain beginning yesterday which shortness-of-breath. No injury. EXAM: RIGHT RIBS AND CHEST - 3+ VIEW COMPARISON:  01/23/2016 and 10/22/2012 FINDINGS: Lungs hypoinflated with minimal vascular crowding over the infrahilar regions unchanged. No definite consolidation or effusion. Mild stable cardiomegaly. No definite rib fracture. Mild degenerative change of the spine. IMPRESSION: No acute findings. Mild cardiomegaly. Electronically Signed   By:  Marin Olp M.D.   On: 02/20/2016 20:49   US Abdomen Limited Ruq  02/20/2016  CLINICAL DATA:  Right upper quadrant pain starting yesterday EXAM: US ABDOMEN LIMITED - RIGHT UPPER QUADRANT COMPARISON:  None. FINDINGS: Gallbladder: No gallstones are noted within gallbladder. Gallbladder wall polyps the largest measures 4 mm. No sonographic Murphy's sign. No thickening of gallbladder wall. Common bile duct: Diameter: 3 mm in diameter within normal limits. Liver:  No focal lesion identified. Within normal limits in parenchymal echogenicity. IMPRESSION: 1. No gallstones are noted within gallbladder. Gallbladder wall polyp measures 4 mm. No sonographic Murphy's sign. No pericholecystic fluid. Normal CBD. Electronically Signed   By: Lahoma Crocker M.D.   On: 02/20/2016 19:06     Assessment and Plan  32M with previous polysubstance abuse, HTN, remote cardiac contusion adm with chest pain; recently had abnormal LVEF and nuc in prep for a surgical laminectomy.  2D Echo 02/05/16: EF 25%, grade 1 DD, mild MR. LHC 03/11/16: normal cors, mild-mod LV dysfunction, mild-moderately elevated LVEDP.  1. Chest pain likely due to acute on recently chronic combined CHF - will review plan for diuretic with MD. May also have room to titrate beta blocker. Will need f/u echo in approx 3 months if compliant with meds to reassess LVEF. Will rx CHF book. Discussed sodium/fluid restriction and daily weights.  2. HTN with hypertensive heart disease - follow with med changes.  3. Recently abnormal TSH, hyperglycemia - pt made aware to f/u PCP. He says he will not be reassigned until July. We can consider re-drawing the TSH and an A1C when he returns for f/u so that those labs are available by the time he sees his primary.  Signed, Melina Copa PA-C Pager: [phone]   Agree with findings by Melina Copa PA-C  S/P radial cath with findings C/W NISCM, EF 25-30%. HTN. Meds adjusted. Wrist OK. D/C home this AM. ROV with me at NL 2-3 weeks. 2D in 3 months for improvement in LV FXN and determination of ICD need for primary prevention. No surg until then.  Lorretta Harp, M.D., [place], [facility], [place] [address] [place], [place]  [address]  [phone] 03/12/2016 11:10 AM

## 2016-03-12 NOTE — Plan of Care (Signed)
Problem: Phase I Progression Outcomes Goal: Initial discharge plan identified Outcome: Progressing Reviewed discharge instructions and reading materials provided for patient, questions answered at this time and patient verbalized understanding, will continue to review as needs arise.

## 2016-03-12 NOTE — Telephone Encounter (Signed)
7-10 day TCM per Dayna-appt 03-21-16 with Brock Bad

## 2016-03-12 NOTE — Clinical Social Work Note (Signed)
Consult received to assist with advance directive, CSW made referral to chaplain. Chaplain visited patient to assist but the patient stated that he did not request assistance with this. CSW signing off at this time.   Liz Beach MSW, [place], [place], [id number]

## 2016-03-12 NOTE — Plan of Care (Signed)
Problem: Phase I Progression Outcomes Goal: Voiding-avoid urinary catheter unless indicated Outcome: Completed/Met Date Met:  03/12/16 Patient has been voiding adequate amounts of clear yellow urine at this time without any difficulty or s/s of complications.

## 2016-03-12 NOTE — Plan of Care (Signed)
Problem: Education: Goal: Knowledge of [place] [facility] Education information/materials will improve Outcome: Completed/Met Date Met:  03/12/16 Reviewed all [place] general rules for the unit and answered all questions and patient verbalized understanding

## 2016-03-13 NOTE — Telephone Encounter (Signed)
Patient contacted regarding discharge from [facility] on 03/12/16.  Patient understands to follow up with provider Murray Hodgkins, NP on 03/21/16 at [place] at [facility]   Patient understands discharge instructions? Yes Patient understands medications and regiment? yes; pt had question about carvedilol which was changed in the hospital to 6.25mg  BID. Pt stated he had refills of the 3.125mg  and was wondering if he could take those since he had so many. Also       his pharmacy did not currently have the 6.25mg  in stock, but were able to give him 3 days worth and they will have it in stock by thursday. I informed patient that he could take the 3.125mg --2 tab BID       until he ran out, and then switch to 6.25 mg--1 tab BID.   Patient understands to bring all medications to this visit? Yes     Pt stated he talked with Melina Copa, PA-C to ask if he could change the bandage on his wrist. He stated he did not get it wet, just cleaned up around the site and put on a clean bandage. He also stated he is feeling good and he checked his BP today which was 118/76.

## 2016-03-13 NOTE — Telephone Encounter (Signed)
New Message  Pt call stating he was released from [facility] hospital on 6/13 after surgery for cath. Pt requesting to speak with RN to see when he will be able to change dressing for his wound. Please call back to discuss

## 2016-03-13 NOTE — Telephone Encounter (Signed)
Informed patient ,can take the bandage off wrist today- no heavy  lifting with hand or arm for 5 days keep area dry for another day He should have received discharge instruction-with instruction on how to take care of site Patient verbalized understanding.

## 2016-03-18 ENCOUNTER — Encounter (HOSPITAL_COMMUNITY): Admission: RE | Payer: Self-pay | Source: Ambulatory Visit

## 2016-03-18 ENCOUNTER — Ambulatory Visit (HOSPITAL_COMMUNITY): Admission: RE | Admit: 2016-03-18 | Payer: Medicaid Other | Source: Ambulatory Visit | Admitting: Cardiovascular Disease

## 2016-03-18 SURGERY — LEFT HEART CATH AND CORONARY ANGIOGRAPHY
Anesthesia: LOCAL

## 2016-03-20 ENCOUNTER — Other Ambulatory Visit: Payer: Self-pay | Admitting: Family Medicine

## 2016-03-20 NOTE — Telephone Encounter (Signed)
Is this ok to refill?  

## 2016-03-21 ENCOUNTER — Ambulatory Visit (INDEPENDENT_AMBULATORY_CARE_PROVIDER_SITE_OTHER): Payer: Medicaid Other | Admitting: Nurse Practitioner

## 2016-03-21 ENCOUNTER — Encounter: Payer: BLUE CROSS/BLUE SHIELD | Admitting: Gastroenterology

## 2016-03-21 ENCOUNTER — Encounter: Payer: Self-pay | Admitting: Nurse Practitioner

## 2016-03-21 ENCOUNTER — Telehealth: Payer: Self-pay | Admitting: Cardiovascular Disease

## 2016-03-21 VITALS — BP 136/72 | HR 68 | Ht 66.0 in | Wt 148.2 lb

## 2016-03-21 DIAGNOSIS — I110 Hypertensive heart disease with heart failure: Secondary | ICD-10-CM | POA: Diagnosis not present

## 2016-03-21 DIAGNOSIS — I5042 Chronic combined systolic (congestive) and diastolic (congestive) heart failure: Secondary | ICD-10-CM

## 2016-03-21 DIAGNOSIS — I429 Cardiomyopathy, unspecified: Secondary | ICD-10-CM

## 2016-03-21 DIAGNOSIS — R739 Hyperglycemia, unspecified: Secondary | ICD-10-CM

## 2016-03-21 DIAGNOSIS — I428 Other cardiomyopathies: Secondary | ICD-10-CM

## 2016-03-21 DIAGNOSIS — R7989 Other specified abnormal findings of blood chemistry: Secondary | ICD-10-CM

## 2016-03-21 LAB — BASIC METABOLIC PANEL
BUN: 15 mg/dL (ref 7–25)
CALCIUM: 9.5 mg/dL (ref 8.6–10.3)
CO2: 25 mmol/L (ref 20–31)
Chloride: 105 mmol/L (ref 98–110)
Creat: 0.98 mg/dL (ref 0.70–1.25)
GLUCOSE: 95 mg/dL (ref 65–99)
Potassium: 4.7 mmol/L (ref 3.5–5.3)
Sodium: 138 mmol/L (ref 135–146)

## 2016-03-21 LAB — HEMOGLOBIN A1C
Hgb A1c MFr Bld: 6.1 % — ABNORMAL HIGH (ref <5.7)
Mean Plasma Glucose: 128 mg/dL

## 2016-03-21 LAB — T4, FREE: FREE T4: 1.1 ng/dL (ref 0.8–1.8)

## 2016-03-21 LAB — TSH: TSH: 0.42 mIU/L (ref 0.40–4.50)

## 2016-03-21 MED ORDER — LISINOPRIL 40 MG PO TABS: 40.0000 mg | ORAL_TABLET | Freq: Every day | ORAL | Status: DC

## 2016-03-21 MED ORDER — HYDROCHLOROTHIAZIDE 25 MG PO TABS: 25.0000 mg | ORAL_TABLET | Freq: Every day | ORAL | Status: DC

## 2016-03-21 NOTE — Telephone Encounter (Signed)
Returned pt call. He is requesting paperwork to be sent to social security office related to his diagnosis. He is going to want his sister to handle this, but I informed him that since she is not listed currently as a DPR contact, he would need to identify that we're able to talk to her. Advised pt to the best of my knowledge, he would need to come in and fill out forms for DPR and ROI. Pt voiced understanding and will come tomorrow to take care of this.

## 2016-03-21 NOTE — Progress Notes (Signed)
Office Visit    Patient Name: Jonathan Dickson Date of Encounter: 03/21/2016  Primary Care Provider:  Lance Bosch, NP Primary Cardiologist:  Adora Fridge, MD   Chief Complaint    60 year old male with a history of hypertension diagnosis of nonischemic cardiomyopathy who presents for follow-up.  Past Medical History    Past Medical History  Diagnosis Date  . Seizures ([place])     with penicillin  . Depression   . Anxiety   . Cervical disc disease   . Chronic combined systolic and diastolic CHF (congestive heart failure) ([place])     a. 01/2016 Echo: EF 25%, inf AK, diffuse sev HK, Gr1 DD, mild MR.  Marland Kitchen NICM (nonischemic cardiomyopathy) ([place])     a. 01/2016 Echo: EF 25%, inf AK, diffuse sev HK, Gr1 DD, mild MR; b. 01/2016 MV: EF 22%, no isch/infarct;  c. 02/2016 Cath: Nl cors, EF 35-45%.  . Hypertensive heart disease   . Abnormal TSH 02/2016  . Hyperglycemia    Past Surgical History  Procedure Laterality Date  . Thumb surgery Right   . Cardiac catheterization N/A 03/11/2016    Procedure: Left Heart Cath and Coronary Angiography;  Surgeon: [name], MD;  Location: [place] [facility];  Service: Cardiovascular;  Laterality: N/A;    Allergies  Allergies  Allergen Reactions  . Peanut-Containing Drug Products Swelling  . Penicillins Other (See Comments)    Convulsions     History of Present Illness    60 year old male with the above complex past medical history including hypertension and chronic back pain. He was recently evaluated for preoperative evaluation as he has been pending Cervical laminectomy. An echocardiogram was performed earlier this year revealing severe LV dysfunction and an EF of 25%. This was followed by a Myoview, which was high risk secondary to low LV function without evidence of ischemia or infarct. He was set up for an outpatient catheterization however had an episode of chest pain in the setting of hypertension while at home and was admitted to [facility]. He ruled  out. He underwent cath revealing normal coronary arteries and an EF of 35-45%. He did have mildly elevated filling pressures and was placed on HCTZ. His carvedilol was also titrated.  Since discharge, he has done reasonably well. His weight has been stable at home. He says his blood pressures typically run in the 140s and above. He only expresses dyspnea when walking up a hill. He has not been having any chest pain and further denies any palpitations, PND, orthopnea, dizziness, syncope, edema, or early satiety.  Home Medications    Prior to Admission medications   Medication Sig Start Date End Date Taking? Authorizing Provider  carvedilol (COREG) 6.25 MG tablet Take 1 tablet (6.25 mg total) by mouth 2 (two) times daily with a meal. 03/12/16  Yes Dayna N Dunn, PA-C  gabapentin (NEURONTIN) 300 MG capsule Take 300 mg by mouth 2 (two) times daily.   Yes Historical Provider, MD  hydrochlorothiazide (HYDRODIURIL) 25 MG tablet Take 1 tablet (25 mg total) by mouth daily. 03/21/16  Yes Rogelia Mire, NP  ibuprofen (ADVIL,MOTRIN) 400 MG tablet Take 1 tablet (400 mg total) by mouth every 6 (six) hours as needed for mild pain or moderate pain. 02/20/16  Yes Junius Creamer, NP  lisinopril (PRINIVIL,ZESTRIL) 40 MG tablet Take 1 tablet (40 mg total) by mouth daily. Must have OV 03/21/16  Yes Rogelia Mire, NP  methocarbamol (ROBAXIN) 500 MG tablet Take 500  mg by mouth 2 (two) times daily.    Yes Historical Provider, MD  traMADol (ULTRAM) 50 MG tablet Take 50 mg by mouth 2 (two) times daily.    Yes Historical Provider, MD    Review of Systems    DOE when walking up hills.  He denies chest pain, palpitations, pnd, orthopnea, n, v, dizziness, syncope, edema, weight gain, or early satiety.  All other systems reviewed and are otherwise negative except as noted above.  Physical Exam    VS:  BP 136/72 mmHg  Pulse 68  Ht 5\' 6"  (1.676 m)  Wt 148 lb 3.2 oz (67.223 kg)  BMI 23.93 kg/m2 , BMI Body mass index is  23.93 kg/(m^2). GEN: Well nourished, well developed, in no acute distress. HEENT: normal. Neck: Supple, no JVD, carotid bruits, or masses. Cardiac: RRR, no murmurs, rubs, or gallops. No clubbing, cyanosis, edema.  Radials/DP/PT 2+ and equal bilaterally.  R wrist cath site w/o bleeding/bruit/hematoma. Respiratory:  Respirations regular and unlabored, clear to auscultation bilaterally.  GI: Soft, nontender, nondistended, BS + x 4. MS: no deformity or atrophy. Skin: warm and dry, no rash. Neuro:  Strength and sensation are intact. Psych: Normal affect.  Accessory Clinical Findings    Recent cath results reviewed. Lab Results  Component Value Date   CREATININE 1.07 03/11/2016   BUN 13 03/11/2016   NA 135 03/11/2016   K 4.3 03/11/2016   CL 106 03/11/2016   CO2 20* 03/11/2016  Gluc 124  Lab Results  Component Value Date   TSH 0.24* 03/06/2016   Assessment & Plan    1.  Nonischemic cardiomyopathy/chronic combined systolic and diastolic congestive heart failure: Patient was recently discovered to have LV dysfunction with an EF of 25% by echo. This subsequently led to diagnostic catheterization which recently showed normal coronary arteries and an EF of 35-45% by ventriculography. He has been maintained on beta blocker, ACE inhibitor, and HCTZ therapy was added in the setting of elevated filling pressures at the time of his catheterization. His weight has been stable at home and he has not been having any significant dyspnea with usual activities, though he does have dyspnea on exertion when walking up hills. He is euvolemic on exam today. His blood pressure stable today, however he notes it typically runs in the 140s and above at home. For that reason, I will titrate his lisinopril to 40 mg daily. He otherwise remains on carvedilol 6.25 mg twice a day and HCTZ 25 mg daily. I will follow-up basic metabolic panel today and arrange for echocardiogram in 3 months to reevaluate LV function and  determine candidacy for ICD therapy. We discussed the importance of daily weights, sodium restriction, medication compliance, and symptom reporting and he verbalizes understanding.   2. Hypertensive heart disease: As above, blood pressure is stable today however runs in the 140s and above at home. I will increase lisinopril to 40 g daily today. Continue carvedilol and HCTZ.  He has follow-up in the pharmacy hypertension clinic on July 7.  3. Hyperglycemia: This was noted in the hospital. He was advised follow-up with primary care but does not have an appointment until July. I will obtain a hemoglobin A1c today.  4. Abnormal TSH: TSH was suppressed at 0.2 for an evaluation on June 7. I will follow-up a TSH and free T4 today. As above, his follow-up with primary care in July.  5. Disposition: Follow-up labs today. Follow-up in the pharmacy hypertension clinic in early July. We will arrange  for echo in approximately 3 months and follow-up with Dr. [name] shortly thereafter.  Murray Hodgkins, NP 03/21/2016, 9:32 AM

## 2016-03-21 NOTE — Telephone Encounter (Signed)
°  New Prob   Pt is requesting to have info regarding his heart condition sent over to his sister so he can fill out medical paperwork for social security. Please call.

## 2016-03-21 NOTE — Patient Instructions (Addendum)
Medication Instructions:   INCREASE LISINOPRIL TO 40 MG ONCE DAILY  Labwork:  Your physician recommends that you HAVE LAB WORK TODAY  Testing/Procedures:  Your physician has requested that you have an echocardiogram. Echocardiography is a painless test that uses sound waves to create images of your heart. It provides your doctor with information about the size and shape of your heart and how well your heart's chambers and valves are working. This procedure takes approximately one hour. There are no restrictions for this procedure.SCHEDULE IN 3 MONTHS    Follow-Up:  Your physician recommends that you schedule a follow-up appointment in: [place] physician recommends that you schedule a follow-up appointment in: AFTER ECHO IN 3 MONTHS .

## 2016-04-05 ENCOUNTER — Encounter: Payer: Self-pay | Admitting: Pharmacist

## 2016-04-05 ENCOUNTER — Ambulatory Visit (INDEPENDENT_AMBULATORY_CARE_PROVIDER_SITE_OTHER): Payer: Medicaid Other | Admitting: Pharmacist

## 2016-04-05 VITALS — BP 128/76 | Wt 147.0 lb

## 2016-04-05 DIAGNOSIS — I10 Essential (primary) hypertension: Secondary | ICD-10-CM

## 2016-04-05 NOTE — Progress Notes (Signed)
Patient ID: Jonathan Dickson                 DOB: 16-Jul-1956                      MRN: [id number]     HPI: Jonathan Dickson is a 60 y.o. male patient of Dr. [name] who presents today for hypertension evaluation. His dose of lisinopril was recently increased to 40mg  daily by Ignacia Bayley. He reports feeling well with improvements in blood pressure since this medication change. He denies dizziness or lightheadedness.   Cardiac Hx: cardiomyopathy, CHF with EF 35-45%, hypertensive heart disease  Current HTN meds:  Carvedilol 6.25mg  BID HCTZ 25mg  daily Lisinopril 40mg  daily  BP goal: <140/90  Family History: He reports no known cardiac problems.   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 I congratulated him on this and encouraged him to keep up the good work.   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 He eats most of his meals at home and use a salt substitute "No SALT."  He tries to avoid fried foods and eats fresh vegetables rather than canned or frozen. He has decreased his coffee intake to 1.5 cups per day but endorses drinking several 20 oz cokes per day. He denies tea and other energy drinks.   Exercise: He has been walking a lot recently. He is planning to move into a senior center by the end of the month that will have a gym and he plans to work out there once he is settled  Home BP readings: he has been borrowing his sisters wrist cuff and checking before bed. He did not bring a log with him today but reports that the numbers have improved with the increased dose of lisinopril. He recalls last night being 116/60. He states he has only seen one number 0000000 systolic and that he thinks was secondary to being in the heat all day.   Wt Readings from Last 3 Encounters:  03/21/16 148 lb 3.2 oz (67.223 kg)  03/12/16 145 lb 4.8 oz (65.908 kg)  03/06/16 149 lb (67.586 kg)   BP Readings from Last 3 Encounters:  03/21/16  136/72  03/12/16 138/91  03/06/16 128/80   Pulse Readings from Last 3 Encounters:  03/21/16 68  03/12/16 88  03/06/16 82    Renal function: CrCl cannot be calculated (Unknown ideal weight.).  Past Medical History  Diagnosis Date  . Seizures ([place])     with penicillin  . Depression   . Anxiety   . Cervical disc disease   . Chronic combined systolic and diastolic CHF (congestive heart failure) ([place])     a. 01/2016 Echo: EF 25%, inf AK, diffuse sev HK, Gr1 DD, mild MR.  Marland Kitchen NICM (nonischemic cardiomyopathy) ([place])     a. 01/2016 Echo: EF 25%, inf AK, diffuse sev HK, Gr1 DD, mild MR; b. 01/2016 MV: EF 22%, no isch/infarct;  c. 02/2016 Cath: Nl cors, EF 35-45%.  . Hypertensive heart disease   . Abnormal TSH 02/2016  . Hyperglycemia     Current Outpatient Prescriptions on File Prior to Visit  Medication Sig Dispense Refill  . carvedilol (COREG) 6.25 MG tablet Take 1 tablet (6.25 mg total) by mouth 2 (two) times daily with a meal. 60 tablet 6  . gabapentin (NEURONTIN) 300 MG capsule Take 300 mg by mouth 2 (  two) times daily.    . hydrochlorothiazide (HYDRODIURIL) 25 MG tablet Take 1 tablet (25 mg total) by mouth daily. 30 tablet 11  . ibuprofen (ADVIL,MOTRIN) 400 MG tablet Take 1 tablet (400 mg total) by mouth every 6 (six) hours as needed for mild pain or moderate pain. 30 tablet 0  . lisinopril (PRINIVIL,ZESTRIL) 40 MG tablet Take 1 tablet (40 mg total) by mouth daily. Must have OV 90 tablet 3  . methocarbamol (ROBAXIN) 500 MG tablet Take 500 mg by mouth 2 (two) times daily.     . traMADol (ULTRAM) 50 MG tablet Take 50 mg by mouth 2 (two) times daily.      No current facility-administered medications on file prior to visit.    Allergies  Allergen Reactions  . Peanut-Containing Drug Products Swelling  . Penicillins Other (See Comments)    Convulsions Has patient had a PCN reaction causing immediate rash, facial/tongue/throat swelling, SOB or lightheadedness with hypotension: No Has  patient had a PCN reaction causing severe rash involving mucus membranes or skin necrosis: No Has patient had a PCN reaction that required hospitalization No Has patient had a PCN reaction occurring within the last 10 years: No If all of the above answers are "NO", then may proceed with Cephalosporin use.      Assessment/Plan: Hypertension: BP is at goal today. Continue current medications at prescribed doses. Continue to monitor at home and call if pressures trend >140/90. Follow up with Dr. [name] as scheduled.    Thank you, Lelan Pons. Patterson Hammers[place], [facility]  04/05/2016 11:10 AM

## 2016-04-05 NOTE — Patient Instructions (Signed)
Return for a a follow up appointment in 2 months with Dr. [name]  Your blood pressure today is 128/76  Check your blood pressure at home daily (if able) and keep record of the readings.  Take your BP meds as follows: Continue same medications at current doses.   Please call the clinic if your blood pressure trend above 140/90.   Bring all of your meds, your BP cuff and your record of home blood pressures to your next appointment.  Exercise as you're able, try to walk approximately 30 minutes per day.  Keep salt intake to a minimum, especially watch canned and prepared boxed foods.  Eat more fresh fruits and vegetables and fewer canned items.  Avoid eating in fast food restaurants.    HOW TO TAKE YOUR BLOOD PRESSURE: . Rest 5 minutes before taking your blood pressure. .  Don't smoke or drink caffeinated beverages for at least 30 minutes before. . Take your blood pressure before (not after) you eat. . Sit comfortably with your back supported and both feet on the floor (don't cross your legs). . Elevate your arm to heart level on a table or a desk. . Use the proper sized cuff. It should fit smoothly and snugly around your bare upper arm. There should be enough room to slip a fingertip under the cuff. The bottom edge of the cuff should be 1 inch above the crease of the elbow. . Ideally, take 3 measurements at one sitting and record the average.

## 2016-04-05 NOTE — Assessment & Plan Note (Signed)
BP is at goal today. Continue current medications at prescribed doses. Continue to monitor at home and call if pressures trend >140/90. Follow up with Dr. [name] as scheduled.

## 2016-04-22 ENCOUNTER — Other Ambulatory Visit: Payer: Self-pay | Admitting: *Deleted

## 2016-04-22 MED ORDER — GABAPENTIN 300 MG PO CAPS: 300.0000 mg | ORAL_CAPSULE | Freq: Every day | ORAL | 1 refills | Status: DC

## 2016-05-06 ENCOUNTER — Ambulatory Visit (INDEPENDENT_AMBULATORY_CARE_PROVIDER_SITE_OTHER): Payer: Medicaid Other | Admitting: Gastroenterology

## 2016-05-06 ENCOUNTER — Encounter: Payer: Self-pay | Admitting: Gastroenterology

## 2016-05-06 VITALS — BP 100/76 | HR 88 | Ht 63.25 in | Wt 147.0 lb

## 2016-05-06 DIAGNOSIS — I509 Heart failure, unspecified: Secondary | ICD-10-CM

## 2016-05-06 DIAGNOSIS — Z1211 Encounter for screening for malignant neoplasm of colon: Secondary | ICD-10-CM | POA: Diagnosis not present

## 2016-05-06 MED ORDER — NA SULFATE-K SULFATE-MG SULF 17.5-3.13-1.6 GM/177ML PO SOLN: ORAL | 0 refills | Status: DC

## 2016-05-06 NOTE — Progress Notes (Signed)
HPI :  60 y/o male with a history of HTN and CHF with EF of 35-45%, here for CRC screening. He had a cardiac cath in June 2017 showing EF of 35-45% with patent coronary arteries. He denies any chest pains or shortness of breath and generally feels well.    No prior colonoscopy. No FH of CRC. No blood in the stools. No changes in bowel habits, no diarrhea or constipation. No abdominal pains. Eating well. No nausea or vomiting. No weight loss.   Prior tobacco use, quit in 2004. No alcohol use.    Past Medical History:  Diagnosis Date  . Abnormal TSH 02/2016  . Anxiety   . Cervical disc disease   . Chronic combined systolic and diastolic CHF (congestive heart failure) ([place])    a. 01/2016 Echo: EF 25%, inf AK, diffuse sev HK, Gr1 DD, mild MR.  . Depression   . Hyperglycemia   . Hypertensive heart disease   . NICM (nonischemic cardiomyopathy) ([place])    a. 01/2016 Echo: EF 25%, inf AK, diffuse sev HK, Gr1 DD, mild MR; b. 01/2016 MV: EF 22%, no isch/infarct;  c. 02/2016 Cath: Nl cors, EF 35-45%.  . Seizures ([place])    with penicillin     Past Surgical History:  Procedure Laterality Date  . CARDIAC CATHETERIZATION N/A 03/11/2016   Procedure: Left Heart Cath and Coronary Angiography;  Surgeon: [name], MD;  Location: [facility];  Service: Cardiovascular;  Laterality: N/A;  . Thumb surgery Right    Family History  Problem Relation Age of Onset  . Diabetes Mother   . Diabetes Sister    Social History  Substance Use Topics  . Smoking status: Former Smoker    Packs/day: 1.50    Years: 8.00    Quit date: 03/15/2003  . Smokeless tobacco: Never Used  . Alcohol use No     Comment: none since 2004   Current Outpatient Prescriptions  Medication Sig Dispense Refill  . carvedilol (COREG) 6.25 MG tablet Take 1 tablet (6.25 mg total) by mouth 2 (two) times daily with a meal. 60 tablet 6  . hydrochlorothiazide (HYDRODIURIL) 25 MG tablet Take 1 tablet (25 mg total) by mouth daily.  30 tablet 11  . ibuprofen (ADVIL,MOTRIN) 400 MG tablet Take 1 tablet (400 mg total) by mouth every 6 (six) hours as needed for mild pain or moderate pain. 30 tablet 0  . lisinopril (PRINIVIL,ZESTRIL) 40 MG tablet Take 1 tablet (40 mg total) by mouth daily. Must have OV 90 tablet 3  . methocarbamol (ROBAXIN) 500 MG tablet Take 500 mg by mouth 2 (two) times daily.     . traMADol (ULTRAM) 50 MG tablet Take 50 mg by mouth 2 (two) times daily.      No current facility-administered medications for this visit.    Allergies  Allergen Reactions  . Peanut-Containing Drug Products Swelling  . Penicillins Other (See Comments)    Convulsions Has patient had a PCN reaction causing immediate rash, facial/tongue/throat swelling, SOB or lightheadedness with hypotension: No Has patient had a PCN reaction causing severe rash involving mucus membranes or skin necrosis: No Has patient had a PCN reaction that required hospitalization No Has patient had a PCN reaction occurring within the last 10 years: No If all of the above answers are "NO", then may proceed with Cephalosporin use.      Review of Systems: All systems reviewed and negative except where noted in HPI.   Lab Results  Component Value Date   WBC 6.1 03/11/2016   HGB 13.8 03/11/2016   HCT 41.3 03/11/2016   MCV 84.1 03/11/2016   PLT 211 03/11/2016    Lab Results  Component Value Date   CREATININE 0.98 03/21/2016   BUN 15 03/21/2016   NA 138 03/21/2016   K 4.7 03/21/2016   CL 105 03/21/2016   CO2 25 03/21/2016    Lab Results  Component Value Date   ALT 20 03/11/2016   AST 35 03/11/2016   ALKPHOS 64 03/11/2016   BILITOT 1.1 03/11/2016     Physical Exam: BP 100/76 (BP Location: Left Arm, Patient Position: Sitting, Cuff Size: Normal)   Pulse 88   Ht 5' 3.25" (1.607 m) Comment: height measured without shoes  Wt 147 lb (66.7 kg)   BMI 25.83 kg/m  Constitutional: Pleasant,well-developed, male in no acute distress. HEENT:  Normocephalic and atraumatic. Conjunctivae are normal. No scleral icterus. Neck supple.  Cardiovascular: Normal rate, regular rhythm.  Pulmonary/chest: Effort normal and breath sounds normal. No wheezing, rales or rhonchi. Abdominal: Soft, nondistended, nontender. Bowel sounds active throughout. There are no masses palpable. No hepatomegaly. Extremities: no edema Lymphadenopathy: No cervical adenopathy noted. Neurological: Alert and oriented to person place and time. Skin: Skin is warm and dry. No rashes noted. Psychiatric: Normal mood and affect. Behavior is normal.   ASSESSMENT AND PLAN: 60 y/o male with history of HTN, CHF with EF 35-45% on recent cardiac catheterization without any significant coronary artery disease, presenting for first time colon cancer screening. He has no anemia or alarm symptoms. He is average risk for colon cancer but is overdue for screening. I discussed options for screening with him including optical colonoscopy and stool based testing. Given he is overdue for screening and his tobacco use history, I suspect he more than likely will have polyps needing removal, and in this light he opted for optical colonoscopy. Discussed risks /benefits of the procedure and he wished to proceed. Further recommendations pending the result.   [place] Cellar, MD [facility] Pager [phone]  CC: Lance Bosch, NP

## 2016-05-06 NOTE — Patient Instructions (Signed)
You have been scheduled for a colonoscopy. Please follow written instructions given to you at your visit today.  Please pick up your prep supplies at the pharmacy within the next 1-3 days. If you use inhalers (even only as needed), please bring them with you on the day of your procedure. Your physician has requested that you go to www.startemmi.com and enter the access code given to you at your visit today. This web site gives a general overview about your procedure. However, you should still follow specific instructions given to you by our office regarding your preparation for the procedure.  If you are age 65 or older, your body mass index should be between 23-30. Your Body mass index is 25.83 kg/m. If this is out of the aforementioned range listed, please consider follow up with your Primary Care Provider.  If you are age 64 or younger, your body mass index should be between 19-25. Your Body mass index is 25.83 kg/m. If this is out of the aformentioned range listed, please consider follow up with your Primary Care Provider.    

## 2016-05-09 ENCOUNTER — Ambulatory Visit (AMBULATORY_SURGERY_CENTER): Payer: Medicaid Other | Admitting: Gastroenterology

## 2016-05-09 ENCOUNTER — Encounter: Payer: Self-pay | Admitting: Gastroenterology

## 2016-05-09 VITALS — BP 150/99 | HR 74 | Temp 98.6°F | Resp 17 | Ht 63.0 in | Wt 147.0 lb

## 2016-05-09 DIAGNOSIS — Z1211 Encounter for screening for malignant neoplasm of colon: Secondary | ICD-10-CM

## 2016-05-09 DIAGNOSIS — D122 Benign neoplasm of ascending colon: Secondary | ICD-10-CM

## 2016-05-09 MED ORDER — SODIUM CHLORIDE 0.9 % IV SOLN: 500.0000 mL | INTRAVENOUS | Status: DC

## 2016-05-09 NOTE — Op Note (Signed)
[name] Patient Name: Jonathan Dickson Procedure Date: 05/09/2016 8:35 AM MRN: [id number] Endoscopist: Jonathan Dickson , MD Age: 60 Referring MD:  Date of Birth: [birth year] Gender: Male Account #: [id number] Procedure:                Colonoscopy Indications:              Screening for malignant neoplasm in the colon, This                            is the patient's first colonoscopy Medicines:                Monitored Anesthesia Care Procedure:                Pre-Anesthesia Assessment:                           - Prior to the procedure, a History and Physical                            was performed, and patient medications and                            allergies were reviewed. The patient's tolerance of                            previous anesthesia was also reviewed. The risks                            and benefits of the procedure and the sedation                            options and risks were discussed with the patient.                            All questions were answered, and informed consent                            was obtained. Prior Anticoagulants: The patient has                            taken no previous anticoagulant or antiplatelet                            agents. ASA Grade Assessment: III - A patient with                            severe systemic disease. After reviewing the risks                            and benefits, the patient was deemed in                            satisfactory condition to undergo the procedure.  After obtaining informed consent, the colonoscope                            was passed under direct vision. Throughout the                            procedure, the patient's blood pressure, pulse, and                            oxygen saturations were monitored continuously. The                            Model PCF-H190DL [id number]) scope was introduced                            through the anus  and advanced to the the cecum,                            identified by appendiceal orifice and ileocecal                            valve. The colonoscopy was performed without                            difficulty. The patient tolerated the procedure                            well. The quality of the bowel preparation was                            good. The ileocecal valve, appendiceal orifice, and                            rectum were photographed. Scope In: 8:45:52 AM Scope Out: 8:56:29 AM Scope Withdrawal Time: 0 hours 8 minutes 29 seconds  Total Procedure Duration: 0 hours 10 minutes 37 seconds  Findings:                 The perianal and digital rectal examinations were                            normal.                           A 8 mm polyp was found in the ascending colon. The                            polyp was sessile. The polyp was removed with a                            cold snare. Resection and retrieval were complete.                           Many small and large-mouthed diverticula were found  in the entire colon.                           The exam was otherwise without abnormality on                            direct and retroflexion views. Complications:            No immediate complications. Estimated blood loss:                            Minimal. Estimated Blood Loss:     Estimated blood loss was minimal. Impression:               - One 8 mm polyp in the ascending colon, removed                            with a cold snare. Resected and retrieved.                           - Diverticulosis in the entire examined colon.                           - The examination was otherwise normal on direct                            and retroflexion views. Recommendation:           - Patient has a contact number available for                            emergencies. The signs and symptoms of potential                            delayed  complications were discussed with the                            patient. Return to normal activities tomorrow.                            Written discharge instructions were provided to the                            patient.                           - Resume previous diet.                           - Continue present medications.                           - No aspirin, ibuprofen, naproxen, or other                            non-steroidal anti-inflammatory drugs for 2 weeks  after polyp removal.                           - Await pathology results.                           - Repeat colonoscopy is recommended for                            surveillance. The colonoscopy date will be                            determined after pathology results from today's                            exam become available for review. Jonathan Lipps P. Armbruster, MD 05/09/2016 8:59:15 AM This report has been signed electronically.

## 2016-05-09 NOTE — Progress Notes (Signed)
No problems noted in the recovery room. maw 

## 2016-05-09 NOTE — Patient Instructions (Signed)
YOU HAD AN ENDOSCOPIC PROCEDURE TODAY AT THE [facility]:   Refer to the procedure report that was given to you for any specific questions about what was found during the examination.  If the procedure report does not answer your questions, please call your gastroenterologist to clarify.  If you requested that your care partner not be given the details of your procedure findings, then the procedure report has been included in a sealed envelope for you to review at your convenience later.  YOU SHOULD EXPECT: Some feelings of bloating in the abdomen. Passage of more gas than usual.  Walking can help get rid of the air that was put into your GI tract during the procedure and reduce the bloating. If you had a lower endoscopy (such as a colonoscopy or flexible sigmoidoscopy) you may notice spotting of blood in your stool or on the toilet paper. If you underwent a bowel prep for your procedure, you may not have a normal bowel movement for a few days.  Please Note:  You might notice some irritation and congestion in your nose or some drainage.  This is from the oxygen used during your procedure.  There is no need for concern and it should clear up in a day or so.  SYMPTOMS TO REPORT IMMEDIATELY:   Following lower endoscopy (colonoscopy or flexible sigmoidoscopy):  Excessive amounts of blood in the stool  Significant tenderness or worsening of abdominal pains  Swelling of the abdomen that is new, acute  Fever of 100F or higher   Following upper endoscopy (EGD)  Vomiting of blood or coffee ground material  New chest pain or pain under the shoulder blades  Painful or persistently difficult swallowing  New shortness of breath  Fever of 100F or higher  Black, tarry-looking stools  For urgent or emergent issues, a gastroenterologist can be reached at any hour by calling [phone].   DIET: Your first meal following the procedure should be a small meal and then it is ok to progress to  your normal diet. Heavy or fried foods are harder to digest and may make you feel nauseous or bloated.  Likewise, meals heavy in dairy and vegetables can increase bloating.  Drink plenty of fluids but you should avoid alcoholic beverages for 24 hours.  ACTIVITY:  You should plan to take it easy for the rest of today and you should NOT DRIVE or use heavy machinery until tomorrow (because of the sedation medicines used during the test).    FOLLOW UP: Our staff will call the number listed on your records the next business day following your procedure to check on you and address any questions or concerns that you may have regarding the information given to you following your procedure. If we do not reach you, we will leave a message.  However, if you are feeling well and you are not experiencing any problems, there is no need to return our call.  We will assume that you have returned to your regular daily activities without incident.  If any biopsies were taken you will be contacted by phone or by letter within the next 1-3 weeks.  Please call us at [phone] if you have not heard about the biopsies in 3 weeks.    SIGNATURES/CONFIDENTIALITY: You and/or your care partner have signed paperwork which will be entered into your electronic medical record.  These signatures attest to the fact that that the information above on your After Visit Summary has been reviewed   and is understood.  Full responsibility of the confidentiality of this discharge information lies with you and/or your care-partner.    Handouts were given to your care partner on polyps, diverticulosis, and a high fiber diet with liberal fluid intake. No aspirin, aspirin products,  ibuprofen, naproxen, advil, motrin, aleve, or other non-steroidal anti-inflammatory drugs for 14 days after polyp removal. You may resume your other current medications today. Await biopsy results. Please call if any questions or concerns.

## 2016-05-09 NOTE — Progress Notes (Signed)
Report to PACU, RN, vss, BBS= Clear.  

## 2016-05-09 NOTE — Progress Notes (Signed)
Called to room to assist during endoscopic procedure.  Patient ID and intended procedure confirmed with present staff. Received instructions for my participation in the procedure from the performing physician.  

## 2016-05-10 ENCOUNTER — Telehealth: Payer: Self-pay

## 2016-05-10 NOTE — Telephone Encounter (Signed)
  Follow up Call-  Call back number 05/09/2016  Post procedure Call Back phone  # [phone]  Permission to leave phone message Yes  Some recent data might be hidden     Patient questions:  Do you have a fever, pain , or abdominal swelling? No. Pain Score  0 *  Have you tolerated food without any problems? Yes.    Have you been able to return to your normal activities? Yes.    Do you have any questions about your discharge instructions: Diet   No. Medications  No. Follow up visit  No.  Do you have questions or concerns about your Care? No.  Actions: * If pain score is 4 or above: No action needed, pain <4.

## 2016-05-14 ENCOUNTER — Encounter: Payer: Self-pay | Admitting: Gastroenterology

## 2016-05-16 ENCOUNTER — Telehealth: Payer: Self-pay | Admitting: Gastroenterology

## 2016-05-16 NOTE — Telephone Encounter (Signed)
Spoke with patient and recommended he take Tylenol not NSAID since he had a polyp removed less than 2 weeks ago.

## 2016-05-21 ENCOUNTER — Encounter: Payer: Self-pay | Admitting: Internal Medicine

## 2016-05-21 ENCOUNTER — Ambulatory Visit: Payer: Medicaid Other | Attending: Internal Medicine | Admitting: Internal Medicine

## 2016-05-21 VITALS — BP 116/81 | HR 80 | Temp 98.0°F | Resp 16 | Wt 147.0 lb

## 2016-05-21 DIAGNOSIS — N529 Male erectile dysfunction, unspecified: Secondary | ICD-10-CM

## 2016-05-21 DIAGNOSIS — M5090 Cervical disc disorder, unspecified, unspecified cervical region: Secondary | ICD-10-CM | POA: Insufficient documentation

## 2016-05-21 DIAGNOSIS — I10 Essential (primary) hypertension: Secondary | ICD-10-CM | POA: Diagnosis present

## 2016-05-21 DIAGNOSIS — F419 Anxiety disorder, unspecified: Secondary | ICD-10-CM | POA: Insufficient documentation

## 2016-05-21 DIAGNOSIS — F329 Major depressive disorder, single episode, unspecified: Secondary | ICD-10-CM | POA: Insufficient documentation

## 2016-05-21 DIAGNOSIS — I5042 Chronic combined systolic (congestive) and diastolic (congestive) heart failure: Secondary | ICD-10-CM | POA: Diagnosis not present

## 2016-05-21 DIAGNOSIS — Z114 Encounter for screening for human immunodeficiency virus [HIV]: Secondary | ICD-10-CM

## 2016-05-21 DIAGNOSIS — Z87891 Personal history of nicotine dependence: Secondary | ICD-10-CM | POA: Insufficient documentation

## 2016-05-21 DIAGNOSIS — Z880 Allergy status to penicillin: Secondary | ICD-10-CM | POA: Insufficient documentation

## 2016-05-21 DIAGNOSIS — Z79899 Other long term (current) drug therapy: Secondary | ICD-10-CM | POA: Diagnosis not present

## 2016-05-21 DIAGNOSIS — Z125 Encounter for screening for malignant neoplasm of prostate: Secondary | ICD-10-CM | POA: Diagnosis not present

## 2016-05-21 DIAGNOSIS — Z1159 Encounter for screening for other viral diseases: Secondary | ICD-10-CM | POA: Diagnosis not present

## 2016-05-21 DIAGNOSIS — Z23 Encounter for immunization: Secondary | ICD-10-CM | POA: Insufficient documentation

## 2016-05-21 DIAGNOSIS — I110 Hypertensive heart disease with heart failure: Secondary | ICD-10-CM | POA: Insufficient documentation

## 2016-05-21 DIAGNOSIS — Z1321 Encounter for screening for nutritional disorder: Secondary | ICD-10-CM

## 2016-05-21 MED ORDER — METFORMIN HCL 500 MG PO TABS: 500.0000 mg | ORAL_TABLET | Freq: Every day | ORAL | 2 refills | Status: DC

## 2016-05-21 NOTE — Patient Instructions (Addendum)
Pneumococcal Polysaccharide Vaccine: What You Need to Know 1. Why get vaccinated? Vaccination can protect older adults (and some children and younger adults) from pneumococcal disease. Pneumococcal disease is caused by bacteria that can spread from person to person through close contact. It can cause ear infections, and it can also lead to more serious infections of the:   Lungs (pneumonia),  Blood (bacteremia), and  Covering of the brain and spinal cord (meningitis). Meningitis can cause deafness and brain damage, and it can be fatal. Anyone can get pneumococcal disease, but children under 62 years of age, people with certain medical conditions, adults over 68 years of age, and cigarette smokers are at the highest risk. About 18,000 older adults die each year from pneumococcal disease in the [place]. Treatment of pneumococcal infections with penicillin and other drugs used to be more effective. But some strains of the disease have become resistant to these drugs. This makes prevention of the disease, through vaccination, even more important. 2. Pneumococcal polysaccharide vaccine (PPSV23) Pneumococcal polysaccharide vaccine (PPSV23) protects against 23 types of pneumococcal bacteria. It will not prevent all pneumococcal disease. PPSV23 is recommended for:  All adults 6 years of age and older,  Anyone 2 through 61 years of age with certain long-term health problems,  Anyone 2 through 61 years of age with a weakened immune system,  Adults 64 through 61 years of age who smoke cigarettes or have asthma. Most people need only one dose of PPSV. A second dose is recommended for certain high-risk groups. People 53 and older should get a dose even if they have gotten one or more doses of the vaccine before they turned 65. Your healthcare provider can give you more information about these recommendations. Most healthy adults develop protection within 2 to 3 weeks of getting the shot. 3. Some  people should not get this vaccine  Anyone who has had a life-threatening allergic reaction to PPSV should not get another dose.  Anyone who has a severe allergy to any component of PPSV should not receive it. Tell your provider if you have any severe allergies.  Anyone who is moderately or severely ill when the shot is scheduled may be asked to wait until they recover before getting the vaccine. Someone with a mild illness can usually be vaccinated.  Children less than 83 years of age should not receive this vaccine.  There is no evidence that PPSV is harmful to either a pregnant woman or to her fetus. However, as a precaution, women who need the vaccine should be vaccinated before becoming pregnant, if possible. 4. Risks of a vaccine reaction With any medicine, including vaccines, there is a chance of side effects. These are usually mild and go away on their own, but serious reactions are also possible. About half of people who get PPSV have mild side effects, such as redness or pain where the shot is given, which go away within about two days. Less than 1 out of 100 people develop a fever, muscle aches, or more severe local reactions. Problems that could happen after any vaccine:  People sometimes faint after a medical procedure, including vaccination. Sitting or lying down for about 15 minutes can help prevent fainting, and injuries caused by a fall. Tell your doctor if you feel dizzy, or have vision changes or ringing in the ears.  Some people get severe pain in the shoulder and have difficulty moving the arm where a shot was given. This happens very rarely.  Any medication  can cause a severe allergic reaction. Such reactions from a vaccine are very rare, estimated at about 1 in a million doses, and would happen within a few minutes to a few hours after the vaccination. As with any medicine, there is a very remote chance of a vaccine causing a serious injury or death. The safety of  vaccines is always being monitored. For more information, visit: http://www.aguilar.org/ 5. What if there is a serious reaction? What should I look for? Look for anything that concerns you, such as signs of a severe allergic reaction, very high fever, or unusual behavior.  Signs of a severe allergic reaction can include hives, swelling of the face and throat, difficulty breathing, a fast heartbeat, dizziness, and weakness. These would usually start a few minutes to a few hours after the vaccination. What should I do? If you think it is a severe allergic reaction or other emergency that can't wait, call 9-1-1 or get to the nearest hospital. Otherwise, call your doctor. Afterward, the reaction should be reported to the Vaccine Adverse Event Reporting System (VAERS). Your doctor might file this report, or you can do it yourself through the VAERS web site at www.vaers.SamedayNews.es, or by calling (979)114-6424.  VAERS does not give medical advice. 6. How can I learn more?  Ask your doctor. He or she can give you the vaccine package insert or suggest other sources of information.  Call your local or state health department.  Contact the Centers for Disease Control and Prevention (CDC):  Call 720 111 3022 (1-800-CDC-INFO) or  Visit CDC's website at http://hunter.com/ CDC Pneumococcal Polysaccharide Vaccine VIS (01/21/14)   This information is not intended to replace advice given to you by your health care provider. Make sure you discuss any questions you have with your health care provider.   Document Released: 07/14/2006 Document Revised: 10/07/2014 Document Reviewed: 01/24/2014 Elsevier Interactive Patient Education 2016 Else[employer] Influenza Virus Vaccine injection (Fluarix) What is this medicine? INFLUENZA VIRUS VACCINE (in floo EN zuh VAHY ruhs vak SEEN) helps to reduce the risk of getting influenza also known as the flu. This medicine may be used for other purposes; ask your health  care provider or pharmacist if you have questions. What should I tell my health care provider before I take this medicine? They need to know if you have any of these conditions: -bleeding disorder like hemophilia -fever or infection -Guillain-Barre syndrome or other neurological problems -immune system problems -infection with the human immunodeficiency virus (HIV) or AIDS -low blood platelet counts -multiple sclerosis -an unusual or allergic reaction to influenza virus vaccine, eggs, chicken proteins, latex, gentamicin, other medicines, foods, dyes or preservatives -pregnant or trying to get pregnant -breast-feeding How should I use this medicine? This vaccine is for injection into a muscle. It is given by a health care professional. A copy of Vaccine Information Statements will be given before each vaccination. Read this sheet carefully each time. The sheet may change frequently. Talk to your pediatrician regarding the use of this medicine in children. Special care may be needed. Overdosage: If you think you have taken too much of this medicine contact a poison control center or emergency room at once. NOTE: This medicine is only for you. Do not share this medicine with others. What if I miss a dose? This does not apply. What may interact with this medicine? -chemotherapy or radiation therapy -medicines that lower your immune system like etanercept, anakinra, infliximab, and adalimumab -medicines that treat or prevent blood clots like warfarin -phenytoin -  steroid medicines like prednisone or cortisone -theophylline -vaccines This list may not describe all possible interactions. Give your health care provider a list of all the medicines, herbs, non-prescription drugs, or dietary supplements you use. Also tell them if you smoke, drink alcohol, or use illegal drugs. Some items may interact with your medicine. What should I watch for while using this medicine? Report any side effects  that do not go away within 3 days to your doctor or health care professional. Call your health care provider if any unusual symptoms occur within 6 weeks of receiving this vaccine. You may still catch the flu, but the illness is not usually as bad. You cannot get the flu from the vaccine. The vaccine will not protect against colds or other illnesses that may cause fever. The vaccine is needed every year. What side effects may I notice from receiving this medicine? Side effects that you should report to your doctor or health care professional as soon as possible: -allergic reactions like skin rash, itching or hives, swelling of the face, lips, or tongue Side effects that usually do not require medical attention (report to your doctor or health care professional if they continue or are bothersome): -fever -headache -muscle aches and pains -pain, tenderness, redness, or swelling at site where injected -weak or tired This list may not describe all possible side effects. Call your doctor for medical advice about side effects. You may report side effects to FDA at 1-800-FDA-1088. Where should I keep my medicine? This vaccine is only given in a clinic, pharmacy, doctor's office, or other health care setting and will not be stored at home. NOTE: This sheet is a summary. It may not cover all possible information. If you have questions about this medicine, talk to your doctor, pharmacist, or health care provider.    2016, Elsevier/Gold Standard. (2008-04-13 09:30:40)   - Diabetes Mellitus and Food It is important for you to manage your blood sugar (glucose) level. Your blood glucose level can be greatly affected by what you eat. Eating healthier foods in the appropriate amounts throughout the day at about the same time each day will help you control your blood glucose level. It can also help slow or prevent worsening of your diabetes mellitus. Healthy eating may even help you improve the level of your  blood pressure and reach or maintain a healthy weight.  General recommendations for healthful eating and cooking habits include:  Eating meals and snacks regularly. Avoid going long periods of time without eating to lose weight.  Eating a diet that consists mainly of plant-based foods, such as fruits, vegetables, nuts, legumes, and whole grains.  Using low-heat cooking methods, such as baking, instead of high-heat cooking methods, such as deep frying. Work with your dietitian to make sure you understand how to use the Nutrition Facts information on food labels. HOW CAN FOOD AFFECT ME? Carbohydrates Carbohydrates affect your blood glucose level more than any other type of food. Your dietitian will help you determine how many carbohydrates to eat at each meal and teach you how to count carbohydrates. Counting carbohydrates is important to keep your blood glucose at a healthy level, especially if you are using insulin or taking certain medicines for diabetes mellitus. Alcohol Alcohol can cause sudden decreases in blood glucose (hypoglycemia), especially if you use insulin or take certain medicines for diabetes mellitus. Hypoglycemia can be a life-threatening condition. Symptoms of hypoglycemia (sleepiness, dizziness, and disorientation) are similar to symptoms of having too much alcohol.  If your health care provider has given you approval to drink alcohol, do so in moderation and use the following guidelines:  Women should not have more than one drink per day, and men should not have more than two drinks per day. One drink is equal to:  12 oz of beer.  5 oz of wine.  1 oz of hard liquor.  Do not drink on an empty stomach.  Keep yourself hydrated. Have water, diet soda, or unsweetened iced tea.  Regular soda, juice, and other mixers might contain a lot of carbohydrates and should be counted. WHAT FOODS ARE NOT RECOMMENDED? As you make food choices, it is important to remember that all  foods are not the same. Some foods have fewer nutrients per serving than other foods, even though they might have the same number of calories or carbohydrates. It is difficult to get your body what it needs when you eat foods with fewer nutrients. Examples of foods that you should avoid that are high in calories and carbohydrates but low in nutrients include:  Trans fats (most processed foods list trans fats on the Nutrition Facts label).  Regular soda.  Juice.  Candy.  Sweets, such as cake, pie, doughnuts, and cookies.  Fried foods. WHAT FOODS CAN I EAT? Eat nutrient-rich foods, which will nourish your body and keep you healthy. The food you should eat also will depend on several factors, including:  The calories you need.  The medicines you take.  Your weight.  Your blood glucose level.  Your blood pressure level.  Your cholesterol level. You should eat a variety of foods, including:  Protein.  Lean cuts of meat.  Proteins low in saturated fats, such as fish, egg whites, and beans. Avoid processed meats.  Fruits and vegetables.  Fruits and vegetables that may help control blood glucose levels, such as apples, mangoes, and yams.  Dairy products.  Choose fat-free or low-fat dairy products, such as milk, yogurt, and cheese.  Grains, bread, pasta, and rice.  Choose whole grain products, such as multigrain bread, whole oats, and brown rice. These foods may help control blood pressure.  Fats.  Foods containing healthful fats, such as nuts, avocado, olive oil, canola oil, and fish. DOES EVERYONE WITH DIABETES MELLITUS HAVE THE SAME MEAL PLAN? Because every person with diabetes mellitus is different, there is not one meal plan that works for everyone. It is very important that you meet with a dietitian who will help you create a meal plan that is just right for you.   This information is not intended to replace advice given to you by your health care provider. Make sure  you discuss any questions you have with your health care provider.   Document Released: 06/13/2005 Document Revised: 10/07/2014 Document Reviewed: 08/13/2013 Elsevier Interactive Patient Education 2016 [place] for Eating Away From Home If You Have Diabetes Controlling your level of blood glucose, also known as blood sugar, can be challenging. It can be even more difficult when you do not prepare your own meals. The following tips can help you manage your diabetes when you eat away from home. PLANNING AHEAD Plan ahead if you know you will be eating away from home:  Ask your health care provider how to time meals and medicine if you are taking insulin.  Make a list of restaurants near you that offer healthy choices. If they have a carry-out menu, take it home and plan what you will order ahead  of time.  Look up the restaurant you want to eat at online. Many chain and fast-food restaurants list nutritional information online. Use this information to choose the healthiest options and to calculate how many carbohydrates will be in your meal.  Use a carbohydrate-counting book or mobile app to look up the carbohydrate content and serving size of the foods you want to eat.  Become familiar with serving sizes and learn to recognize how many servings are in a portion. This will allow you to estimate how many carbohydrates you can eat. FREE FOODS A "free food" is any food or drink that has less than 5 g of carbohydrates per serving. Free foods include:  Many vegetables.  Hard boiled eggs.  Nuts or seeds.  Olives.  Cheeses.  Meats. These types of foods make good appetizer choices and are often available at salad bars. Lemon juice, vinegar, or a low-calorie salad dressing of fewer than 20 calories per serving can be used as a "free" salad dressing.  CHOICES TO REDUCE CARBOHYDRATES  Substitute nonfat sweetened yogurt with a sugar-free yogurt. Yogurt made from soy milk may also  be used, but you will still want a sugar-free or plain option to choose a lower carbohydrate amount.  Ask your server to take away the bread basket or chips from your table.  Order fresh fruit. A salad bar often offers fresh fruit choices. Avoid canned fruit because it is usually packed in sugar or syrup.  Order a salad, and eat it without dressing. Or, create a "free" salad dressing.  Ask for substitutions. For example, instead of [place] fries, request an order of a vegetable such as salad, green beans, or broccoli. OTHER TIPS   If you take insulin, take the insulin once your food arrives to your table. This will ensure your insulin and food are timed correctly.  Ask your server about the portion size before your order, and ask for a take-out box if the portion has more servings than you should have. When your food comes, leave the amount you should have on the plate, and put the rest in the take-out box.  Consider splitting an entree with someone and ordering a side salad.   This information is not intended to replace advice given to you by your health care provider. Make sure you discuss any questions you have with your health care provider.   Document Released: 09/16/2005 Document Revised: 06/07/2015 Document Reviewed: 12/14/2013 Elsevier Interactive Patient Education 2016 [employer].  -  Diabetes and Exercise Exercising regularly is important. It is not just about losing weight. It has many health benefits, such as:  Improving your overall fitness, flexibility, and endurance.  Increasing your bone density.  Helping with weight control.  Decreasing your body fat.  Increasing your muscle strength.  Reducing stress and tension.  Improving your overall health. People with diabetes who exercise gain additional benefits because exercise:  Reduces appetite.  Improves the body's use of blood sugar (glucose).  Helps lower or control blood glucose.  Decreases blood  pressure.  Helps control blood lipids (such as cholesterol and triglycerides).  Improves the body's use of the hormone insulin by:  Increasing the body's insulin sensitivity.  Reducing the body's insulin needs.  Decreases the risk for heart disease because exercising:  Lowers cholesterol and triglycerides levels.  Increases the levels of good cholesterol (such as high-density lipoproteins [HDL]) in the body.  Lowers blood glucose levels. YOUR ACTIVITY PLAN  Choose an activity that you enjoy, and  set realistic goals. To exercise safely, you should begin practicing any new physical activity slowly, and gradually increase the intensity of the exercise over time. Your health care provider or diabetes educator can help create an activity plan that works for you. General recommendations include:  Encouraging children to engage in at least 60 minutes of physical activity each day.  Stretching and performing strength training exercises, such as yoga or weight lifting, at least 2 times per week.  Performing a total of at least 150 minutes of moderate-intensity exercise each week, such as brisk walking or water aerobics.  Exercising at least 3 days per week, making sure you allow no more than 2 consecutive days to pass without exercising.  Avoiding long periods of inactivity (90 minutes or more). When you have to spend an extended period of time sitting down, take frequent breaks to walk or stretch. RECOMMENDATIONS FOR EXERCISING WITH TYPE 1 OR TYPE 2 DIABETES   Check your blood glucose before exercising. If blood glucose levels are greater than 240 mg/dL, check for urine ketones. Do not exercise if ketones are present.  Avoid injecting insulin into areas of the body that are going to be exercised. For example, avoid injecting insulin into:  The arms when playing tennis.  The legs when jogging.  Keep a record of:  Food intake before and after you exercise.  Expected peak times of  insulin action.  Blood glucose levels before and after you exercise.  The type and amount of exercise you have done.  Review your records with your health care provider. Your health care provider will help you to develop guidelines for adjusting food intake and insulin amounts before and after exercising.  If you take insulin or oral hypoglycemic agents, watch for signs and symptoms of hypoglycemia. They include:  Dizziness.  Shaking.  Sweating.  Chills.  Confusion.  Drink plenty of water while you exercise to prevent dehydration or heat stroke. Body water is lost during exercise and must be replaced.  Talk to your health care provider before starting an exercise program to make sure it is safe for you. Remember, almost any type of activity is better than none.   This information is not intended to replace advice given to you by your health care provider. Make sure you discuss any questions you have with your health care provider.   Document Released: 12/07/2003 Document Revised: 01/31/2015 Document Reviewed: 02/23/2013 Elsevier Interactive Patient Education [employer].

## 2016-05-21 NOTE — Progress Notes (Addendum)
Jonathan Dickson, is a 60 y.o. male  [id number]  [id number]  DOB - December 19, 1955  CC:  Chief Complaint  Patient presents with  . Establish Care  . Hypertension       HPI: Jonathan Dickson is a 60 y.o. male here today to establish medical care, last seen in clinic 1/17, w/ hx of chronic combined CHF, being eval for possible ICD, htn, prediabetes, pending ortho surgery for cervical radiculitis later in year once cleared by cardiology.  Pt denies orthopnea, syncope, pnd.  No sob/doe, does not smoke or drink etoh.  He works as Technical brewer, and is a twin. (has twin sister younger than him by 1 1/2 min).  C/o of ED lately. At first he thought due to colonoscopy, but sx persist.  Patient has No headache, No chest pain, No abdominal pain - No Nausea, No new weakness tingling or numbness, No Cough - SOB.    Review of Systems: Per HPI, o/w all systems reviewed and negative.   Allergies  Allergen Reactions  . Peanut-Containing Drug Products Swelling  . Penicillins Other (See Comments)    Convulsions Has patient had a PCN reaction causing immediate rash, facial/tongue/throat swelling, SOB or lightheadedness with hypotension: No Has patient had a PCN reaction causing severe rash involving mucus membranes or skin necrosis: No Has patient had a PCN reaction that required hospitalization No Has patient had a PCN reaction occurring within the last 10 years: No If all of the above answers are "NO", then may proceed with Cephalosporin use.    Past Medical History:  Diagnosis Date  . Abnormal TSH 02/2016  . Anxiety   . Cervical disc disease   . Chronic combined systolic and diastolic CHF (congestive heart failure) ([place])    a. 01/2016 Echo: EF 25%, inf AK, diffuse sev HK, Gr1 DD, mild MR.  . Depression   . Hyperglycemia   . Hypertensive heart disease   . NICM (nonischemic cardiomyopathy) ([place])    a. 01/2016 Echo: EF 25%, inf AK, diffuse sev HK, Gr1 DD, mild MR; b. 01/2016 MV: EF 22%, no  isch/infarct;  c. 02/2016 Cath: Nl cors, EF 35-45%.  . Seizures ([place])    with penicillin   Current Outpatient Prescriptions on File Prior to Visit  Medication Sig Dispense Refill  . carvedilol (COREG) 6.25 MG tablet Take 1 tablet (6.25 mg total) by mouth 2 (two) times daily with a meal. 60 tablet 6  . hydrochlorothiazide (HYDRODIURIL) 25 MG tablet Take 1 tablet (25 mg total) by mouth daily. 30 tablet 11  . lisinopril (PRINIVIL,ZESTRIL) 40 MG tablet Take 1 tablet (40 mg total) by mouth daily. Must have OV 90 tablet 3  . methocarbamol (ROBAXIN) 500 MG tablet Take 500 mg by mouth 2 (two) times daily.     . traMADol (ULTRAM) 50 MG tablet Take 50 mg by mouth 2 (two) times daily.     Marland Kitchen ibuprofen (ADVIL,MOTRIN) 400 MG tablet Take 1 tablet (400 mg total) by mouth every 6 (six) hours as needed for mild pain or moderate pain. (Patient not taking: Reported on 05/21/2016) 30 tablet 0   Current Facility-Administered Medications on File Prior to Visit  Medication Dose Route Frequency Provider Last Rate Last Dose  . 0.9 %  sodium chloride infusion  500 mL Intravenous Continuous Manus Gunning, MD       Family History  Problem Relation Age of Onset  . Diabetes Mother   . Diabetes Sister    Social History   Social  History  . Marital status: Legally Separated    Spouse name: N/A  . Number of children: 3  . Years of education: N/A   Occupational History  . Not on file.   Social History Main Topics  . Smoking status: Former Smoker    Packs/day: 1.50    Years: 8.00    Quit date: 03/15/2003  . Smokeless tobacco: Never Used  . Alcohol use No     Comment: none since 2004  . Drug use: No     Comment: former  none since 2004  . Sexual activity: Not on file   Other Topics Concern  . Not on file   Social History Narrative  . No narrative on file    Objective:   Vitals:   05/21/16 1528  BP: 116/81  Pulse: 80  Resp: 16  Temp: 98 F (36.7 C)    Filed Weights   05/21/16 1528    Weight: 147 lb (66.7 kg)    BP Readings from Last 3 Encounters:  05/21/16 116/81  05/09/16 (!) 150/99  05/06/16 100/76    Physical Exam: Constitutional: Patient appears well-developed and well-nourished. No distress. AAOx3, pleasant.  HENT: Normocephalic, atraumatic, External right and left ear normal. Oropharynx is clear and moist.  bilat TMs clear. Eyes: Conjunctivae and EOM are normal. PERRL, no scleral icterus. Neck: Normal ROM. Neck supple. No JVD.  CVS: RRR, S1/S2 +, no murmurs, no gallops, no carotid bruit.  Pulmonary: Effort and breath sounds normal, no stridor, rhonchi, wheezes, rales.  Abdominal: Soft. BS +, no distension, tenderness, rebound or guarding.  Musculoskeletal: Normal range of motion. No edema and no tenderness.  LE: bilat/ no c/c/e, pulses 2+ bilateral. Neuro: Alert.  muscle tone coordination wnl. No cranial nerve deficit grossly. Skin: Skin is warm and dry. No rash noted. Not diaphoretic. No erythema. No pallor. Psychiatric: Normal mood and affect. Behavior, judgment, thought content normal.  Lab Results  Component Value Date   WBC 6.1 03/11/2016   HGB 13.8 03/11/2016   HCT 41.3 03/11/2016   MCV 84.1 03/11/2016   PLT 211 03/11/2016   Lab Results  Component Value Date   CREATININE 0.98 03/21/2016   BUN 15 03/21/2016   NA 138 03/21/2016   K 4.7 03/21/2016   CL 105 03/21/2016   CO2 25 03/21/2016    Lab Results  Component Value Date   HGBA1C 6.1 (H) 03/21/2016   Lipid Panel     Component Value Date/Time   CHOL 155 10/04/2014 1508   TRIG 87 10/04/2014 1508   HDL 54 10/04/2014 1508   CHOLHDL 2.9 10/04/2014 1508   VLDL 17 10/04/2014 1508   LDLCALC 84 10/04/2014 1508       Depression screen PHQ 2/9 05/21/2016 10/31/2015 05/09/2015 04/18/2015 09/27/2014  Decreased Interest 2 0 0 0 0  Down, Depressed, Hopeless 0 0 0 0 0  PHQ - 2 Score 2 0 0 0 0  Altered sleeping 3 - - - -  Tired, decreased energy 2 - - - -  Change in appetite 0 - - - -   Feeling bad or failure about yourself  2 - - - -  Trouble concentrating 2 - - - -  Moving slowly or fidgety/restless 0 - - - -  Suicidal thoughts 0 - - - -  PHQ-9 Score 11 - - - -    Assessment and plan:   1. Essential hypertension Well controlled on lisinopril 40 qd, coreg 6.25 bid, hctz 25 qd  2. Chronic combined systolic and diastolic CHF (congestive heart failure) ([place]) Evuolemic, defer to cards, pending eval for ICD pending repeat ECHO.  3. ED? Suspect From bb - not on NTG sl, will confirm w/ Cards if ok for Sildenifil.  4. Flu vaccine need - Flu Vaccine QUAD 36+ mos PF IM (Fluarix & Fluzone Quad PF)  5. Encounter for screening for HIV - HIV antibody (with reflex)  6. Prostate cancer screening - PSA, Medicare  7. Encounter for vitamin deficiency screening - Vitamin D, 25-hydroxy  8. Need for hepatitis C screening test - Hepatitis C antibody  9. Pneumococcal 23v today.  10. Next time  - tdap  11. Cervical disc disease - pending cards clearance, surgery has been postponed 3 x now. - pain rx per ortho.   Return in about 3 months (around 08/21/2016).  The patient was given clear instructions to go to ER or return to medical center if symptoms don't improve, worsen or new problems develop. The patient verbalized understanding. The patient was told to call to get lab results if they haven't heard anything in the next week.    This note has been created with Surveyor, quantity. Any transcriptional errors are unintentional.   Maren Reamer, MD, [facility] [place], [place]   05/21/2016, 4:11 PM     05/22/16 dw Marlyn Corporal, NP, with Cards, ok by cards for viagra prn. Clean coronaries recently. Will ask cma to call pt re: rx.

## 2016-05-21 NOTE — Progress Notes (Signed)
Pt is in the office today for establish care and hypertension Pt states his pain level today in the office is a 7 Pt states his pain is coming from his back

## 2016-05-22 LAB — HEPATITIS C ANTIBODY: HCV AB: NEGATIVE

## 2016-05-22 LAB — PSA: PSA: 0.8 ng/mL (ref <=4.0)

## 2016-05-22 LAB — VITAMIN D 25 HYDROXY (VIT D DEFICIENCY, FRACTURES): Vit D, 25-Hydroxy: 23 ng/mL — ABNORMAL LOW (ref 30–100)

## 2016-05-22 LAB — HIV ANTIBODY (ROUTINE TESTING W REFLEX): HIV 1&2 Ab, 4th Generation: NONREACTIVE

## 2016-05-22 MED ORDER — SILDENAFIL CITRATE 25 MG PO TABS: 25.0000 mg | ORAL_TABLET | Freq: Every day | ORAL | 1 refills | Status: DC | PRN

## 2016-05-22 NOTE — Addendum Note (Signed)
Addended by: Lottie Mussel T on: 05/22/2016 08:49 AM   Modules accepted: Orders

## 2016-05-24 ENCOUNTER — Telehealth: Payer: Self-pay | Admitting: Internal Medicine

## 2016-05-24 NOTE — Telephone Encounter (Signed)
Will route to PCP 

## 2016-05-24 NOTE — Telephone Encounter (Signed)
Pt. Called requesting to speak with his PCP. Pt. States he had spoken with his PCP about getting a new medication and per PCP she stated that she had to speak with pt. Cardiologist. Pt. Would like to know if that has been done. Please f/u with pt.

## 2016-05-27 ENCOUNTER — Telehealth: Payer: Self-pay | Admitting: Internal Medicine

## 2016-05-27 NOTE — Telephone Encounter (Signed)
Will forward to pcp

## 2016-05-27 NOTE — Telephone Encounter (Signed)
Pt calling stating he was Rx Metformin last Tuesday and the medication has been upsetting his stomach  Pt would like to know if this is normal or if he should be Rx a different medication

## 2016-05-28 MED ORDER — METFORMIN HCL ER 500 MG PO TB24: 500.0000 mg | ORAL_TABLET | Freq: Every day | ORAL | 3 refills | Status: DC

## 2016-05-28 NOTE — Telephone Encounter (Signed)
Please call. Typically the short acting metformin will initially cause stomach upset and some diarrhea. But sounds like he is having problems, so I just went ahead to change the rx to long acting metformin. Take it once a day, should have less gi symptoms. He may have a sense of fullness initially when he takes it, but less indigestion and diarrhea. thx

## 2016-05-28 NOTE — Telephone Encounter (Signed)
Please call:    Your vit D levels was low, I would recommend taking over the counter vitamin D 1000 IU daily. Very low vit d, can cause bone/muscle pain. rx for Vit D replacement in chart, take weekly and will rechk levels in 3 months.  HIV screening and hep c screening negative.  Prostate cancer screening negative as well. I was able to confirm with you cardiologist that Viagra is ok. I sent prescription to our [facility]. They may have free samples. Take care. Thanks.

## 2016-05-29 NOTE — Telephone Encounter (Signed)
Contacted pt and made him aware of Dr. Janne Napoleon changes pt states he understands and doesn't have any questions or concerns

## 2016-05-29 NOTE — Telephone Encounter (Signed)
Pt is aware and doesn't have any questions or concerns  

## 2016-06-19 ENCOUNTER — Telehealth: Payer: Self-pay | Admitting: Cardiovascular Disease

## 2016-06-19 NOTE — Telephone Encounter (Signed)
Received records from [place] for appointment on 06/25/16 with Dr [name].  Records given to [facility] (medical records) for Dr Kennon Holter schedule on 06/25/16. lp

## 2016-06-21 ENCOUNTER — Ambulatory Visit (HOSPITAL_COMMUNITY): Payer: Medicaid Other | Attending: Cardiology

## 2016-06-21 ENCOUNTER — Other Ambulatory Visit: Payer: Self-pay

## 2016-06-21 DIAGNOSIS — I5020 Unspecified systolic (congestive) heart failure: Secondary | ICD-10-CM | POA: Diagnosis not present

## 2016-06-21 DIAGNOSIS — I429 Cardiomyopathy, unspecified: Secondary | ICD-10-CM | POA: Diagnosis not present

## 2016-06-21 DIAGNOSIS — I517 Cardiomegaly: Secondary | ICD-10-CM | POA: Insufficient documentation

## 2016-06-21 DIAGNOSIS — I428 Other cardiomyopathies: Secondary | ICD-10-CM

## 2016-06-21 DIAGNOSIS — I340 Nonrheumatic mitral (valve) insufficiency: Secondary | ICD-10-CM | POA: Diagnosis not present

## 2016-06-21 LAB — ECHOCARDIOGRAM COMPLETE
AOASC: 32 cm
CHL CUP DOP CALC LVOT VTI: 16.5 cm
CHL CUP TV REG PEAK VELOCITY: 229 cm/s
E/e' ratio: 6.56
EWDT: 243 ms
FS: 19 % — AB (ref 28–44)
IVS/LV PW RATIO, ED: 0.8
LA ID, A-P, ES: 31 mm
LA vol A4C: 26.5 ml
LA vol index: 16.6 mL/m2
LA vol: 28.8 mL
LADIAMINDEX: 1.79 cm/m2
LEFT ATRIUM END SYS DIAM: 31 mm
LV E/e' medial: 6.56
LV PW d: 14.3 mm — AB (ref 0.6–1.1)
LV e' LATERAL: 6.2 cm/s
LVEEAVG: 6.56
LVOT SV: 75 mL
LVOT area: 4.52 cm2
LVOT peak vel: 87.2 cm/s
LVOTD: 24 mm
MV Dec: 243
MV pk E vel: 40.7 m/s
MVPKAVEL: 63.1 m/s
RV sys press: 24 mmHg
TDI e' lateral: 6.2
TDI e' medial: 5.11
TRMAXVEL: 229 cm/s

## 2016-06-24 ENCOUNTER — Telehealth: Payer: Self-pay | Admitting: Cardiovascular Disease

## 2016-06-24 NOTE — Telephone Encounter (Signed)
Follow up      Patient returning call back for - echo test results

## 2016-06-25 ENCOUNTER — Ambulatory Visit (INDEPENDENT_AMBULATORY_CARE_PROVIDER_SITE_OTHER): Payer: Medicaid Other | Admitting: Cardiovascular Disease

## 2016-06-25 ENCOUNTER — Encounter: Payer: Self-pay | Admitting: *Deleted

## 2016-06-25 ENCOUNTER — Encounter: Payer: Self-pay | Admitting: Cardiovascular Disease

## 2016-06-25 VITALS — BP 124/60 | HR 60 | Ht 63.0 in | Wt 147.0 lb

## 2016-06-25 DIAGNOSIS — I519 Heart disease, unspecified: Secondary | ICD-10-CM

## 2016-06-25 DIAGNOSIS — I420 Dilated cardiomyopathy: Secondary | ICD-10-CM

## 2016-06-25 DIAGNOSIS — I10 Essential (primary) hypertension: Secondary | ICD-10-CM | POA: Diagnosis not present

## 2016-06-25 MED ORDER — CARVEDILOL 6.25 MG PO TABS
9.3750 mg | ORAL_TABLET | Freq: Two times a day (BID) | ORAL | 3 refills | Status: DC
Start: 2016-06-25 — End: 2016-08-27

## 2016-06-25 NOTE — Patient Instructions (Signed)
Medication Instructions:  Your physician has recommended you make the following change in your medication:  1- INCREASE Carvedilol to 9.375 mg (1.5 tablets) by mouth twice a day.   Testing/Procedures: Your physician has requested that you have an echocardiogram. Echocardiography is a painless test that uses sound waves to create images of your heart. It provides your doctor with information about the size and shape of your heart and how well your heart's chambers and valves are working. This procedure takes approximately one hour. There are no restrictions for this procedure.  IN ONE YEAR PRIOR TO APPOINTMENT WITH DR BERRY IN ONE YEAR.    Follow-Up: You have been referred to [facility] for BP [facility]IC in 4-6 weeks to titrate your medication as needed.  Your physician wants you to follow-up in: [place]. You will receive a reminder letter in the mail two months in advance. If you don't receive a letter, please call our office to schedule the follow-up appointment.   If you need a refill on your cardiac medications before your next appointment, please call your pharmacy.

## 2016-06-25 NOTE — Assessment & Plan Note (Signed)
History of nonischemic, primary myopathy demonstrated by cardiac catheterization performed by Dr. Ellyn Hack 03/11/16. His ejection fraction when initially assessed 02/05/16 was 25%. After beginning her feel all and lisinopril a follow-up echo revealed an increase in his EF 35-40%. At this level he is out of the dangers with right sudden death and has no need for an ICD. In addition, I'm clearing him a mildly elevated risk for his cervical laminectomy. We will continue to titrate his carvedilol. I will repeat an echo in 12 months and see him back after that for further evaluation. He is currently asymptomatic from his dilated nonischemic myopathy.

## 2016-06-25 NOTE — Progress Notes (Signed)
06/25/2016 [name]   1956/05/20  [id number]  Primary Physician Dawn Lazarus Gowda, MD Primary Cardiologist: Lorretta Harp MD Renae Gloss  HPI:  Mr. 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 I last saw him in the office 03/06/16 for preoperative clearance. He was scheduled to have cervical laminectomy today but this was put off because of need for cardiac clearance.His only risk factor is hypertension. Smoking 13 years ago. He's never had a heart attack or stroke. He denies chest pain or shortness of breath. I performed 2-D echocardiography which revealed unexpectedly ejection fraction of 25% with inferior wall akinesis and severe hypokinesia otherwise. A Myoview stress test confirmed the deep diminished ejection fraction but did not show any areas of ischemia or infarction. 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 He underwent right left heart cath by Dr. Ellyn Hack 03/11/16 confirming this. Ultimately, he is begun on carvedilol and lisinopril. His ejection fraction improved up to 35-40% by 2-D echocardiogram performed 06/21/16. He is completely symptomatic. Based on this Sunday to clear him for this coming elective cervical laminectomy and mildly elevated risk. We'll continue to titrate his carvedilol as an outpatient..   Current Outpatient Prescriptions  Medication Sig Dispense Refill  . carvedilol (COREG) 6.25 MG tablet Take 1 tablet (6.25 mg total) by mouth 2 (two) times daily with a meal. 60 tablet 6  . hydrochlorothiazide (HYDRODIURIL) 25 MG tablet Take 1 tablet (25 mg total) by mouth daily. 30 tablet 11  . ibuprofen (ADVIL,MOTRIN) 400 MG tablet Take 1 tablet (400 mg total) by mouth every 6 (six) hours as needed for mild pain or moderate pain. 30 tablet 0  . lisinopril (PRINIVIL,ZESTRIL) 40 MG tablet  Take 1 tablet (40 mg total) by mouth daily. Must have OV 90 tablet 3  . metFORMIN (GLUCOPHAGE XR) 500 MG 24 hr tablet Take 1 tablet (500 mg total) by mouth daily with breakfast. 90 tablet 3  . methocarbamol (ROBAXIN) 500 MG tablet Take 500 mg by mouth 2 (two) times daily.     . traMADol (ULTRAM) 50 MG tablet Take 50 mg by mouth 2 (two) times daily.     . sildenafil (VIAGRA) 25 MG tablet Take 1 tablet (25 mg total) by mouth daily as needed for erectile dysfunction. (Patient not taking: Reported on 06/25/2016) 10 tablet 1   Current Facility-Administered Medications  Medication Dose Route Frequency Provider Last Rate Last Dose  . 0.9 %  sodium chloride infusion  500 mL Intravenous Continuous Manus Gunning, MD        Allergies  Allergen Reactions  . Peanut-Containing Drug Products Swelling  . Penicillins Other (See Comments)    Convulsions Has patient had a PCN reaction causing immediate rash, facial/tongue/throat swelling, SOB or lightheadedness with hypotension: No Has patient had a PCN reaction causing severe rash involving mucus membranes or skin necrosis: No Has patient had a PCN reaction that required hospitalization No Has patient had a PCN reaction occurring within the last 10 years: No If all of the above answers are "NO", then may proceed with Cephalosporin use.     Social History   Social History  . Marital status: Legally Separated    Spouse name: N/A  . Number of children: 3  . Years of education: N/A   Occupational History  . Not on  file.   Social History Main Topics  . Smoking status: Former Smoker    Packs/day: 1.50    Years: 8.00    Quit date: 03/15/2003  . Smokeless tobacco: Never Used  . Alcohol use No     Comment: none since 2004  . Drug use: No     Comment: former  none since 2004  . Sexual activity: Not on file   Other Topics Concern  . Not on file   Social History Narrative  . No narrative on file     Review of Systems: General:  negative for chills, fever, night sweats or weight changes.  Cardiovascular: negative for chest pain, dyspnea on exertion, edema, orthopnea, palpitations, paroxysmal nocturnal dyspnea or shortness of breath Dermatological: negative for rash Respiratory: negative for cough or wheezing Urologic: negative for hematuria Abdominal: negative for nausea, vomiting, diarrhea, bright red blood per rectum, melena, or hematemesis Neurologic: negative for visual changes, syncope, or dizziness All other systems reviewed and are otherwise negative except as noted above.    Blood pressure 124/60, pulse 60, height 5\' 3"  (1.6 m), weight 147 lb (66.7 kg).  General appearance: alert and no distress Neck: no adenopathy, no carotid bruit, no JVD, supple, symmetrical, trachea midline and thyroid not enlarged, symmetric, no tenderness/mass/nodules Lungs: clear to auscultation bilaterally Heart: regular rate and rhythm, S1, S2 normal, no murmur, click, rub or gallop Extremities: extremities normal, atraumatic, no cyanosis or edema  EKG not performed today  ASSESSMENT AND PLAN:   Essential hypertension History of hypertension blood pressure measured today at 124/60. He is on carvedilol, hydrochlorothiazide and lisinopril. 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 Ellyn Hack 03/11/16. His ejection fraction when initially assessed 02/05/16 was 25%. After beginning her feel all and lisinopril a follow-up echo revealed an increase in his EF 35-40%. At this level he is out of the dangers with right sudden death and has no need for an ICD. In addition, I'm clearing him a mildly elevated risk for his cervical laminectomy. We will continue to titrate his carvedilol. I will repeat an echo in 12 months and see him back after that for further evaluation. He is currently asymptomatic from his  dilated nonischemic myopathy.      Lorretta Harp MD FACP,FACC,FAHA, [facility] 06/25/2016 3:38 PM

## 2016-06-25 NOTE — Assessment & Plan Note (Signed)
History of hypertension blood pressure measured today at 124/60. He is on carvedilol, hydrochlorothiazide and lisinopril. Continue current meds at current dosing

## 2016-06-27 ENCOUNTER — Telehealth: Payer: Self-pay | Admitting: Internal Medicine

## 2016-06-27 NOTE — Telephone Encounter (Signed)
[facility] called and stated that pt. Was given an Rx for sildenafil (VIAGRA) 25 MG tablet. Rep states that pt. Has not been able to get the medication b/c it is expensive and rep would like to know if it can be switched to 20 mg. Please f/u

## 2016-06-28 NOTE — Telephone Encounter (Signed)
Contacted the pharmacy back and gave verbal order to change the Sildenafil to 20mg  pharmacy representative states pt's usually get 50 tablets cause they can take up to 1-5 tablets. Verbal order for Sildenafil 20mg  50 tablets no refill

## 2016-07-01 ENCOUNTER — Other Ambulatory Visit: Payer: Self-pay | Admitting: Orthopedic Surgery

## 2016-07-03 NOTE — Pre-Procedure Instructions (Addendum)
JSUTIN BIEKER  07/03/2016      [facility] [id number] - [place], [place] [address] Phone: [phone] Fax: [phone]  [place], [place] - [address] [place] [address] Phone: [phone] Fax: [place], [place] [address] [place] [address] Phone: [phone] Fax: [phone]    Your procedure is scheduled on October 11  Report to [facility] at 1000 A.M.  Call this number if you have problems the morning of surgery:  [phone]   Remember:  Do not eat food or drink liquids after midnight.   Take these medicines the morning of surgery with A SIP OF WATER carvedilol (COREG), methocarbamol (ROBAXIN),  traMADol (ULTRAM)   7 days prior to surgery STOP taking any Aspirin, Aleve, Naproxen, Ibuprofen, Motrin, Advil, Goody's, BC's, all herbal medications, fish oil, and all vitamins  WHAT DO I DO ABOUT MY DIABETES MEDICATION?   Marland Kitchen Do not take oral diabetes medicines (pills) the morning of surgery. METFORMIN  How to Manage Your Diabetes Before and After Surgery  Why is it important to control my blood sugar before and after surgery? . Improving blood sugar levels before and after surgery helps healing and can limit problems. . A way of improving blood sugar control is eating a healthy diet by: o  Eating less sugar and carbohydrates o  Increasing activity/exercise o  Talking with your doctor about reaching your blood sugar goals . High blood sugars (greater than 180 mg/dL) can raise your risk of infections and slow your recovery, so you will need to focus on controlling your diabetes during the weeks before surgery. . Make sure that the doctor who takes care of your diabetes knows about your planned surgery including the date and location.  How  do I manage my blood sugar before surgery? . Check your blood sugar at least 4 times a day, starting 2 days before surgery, to make sure that the level is not too high or low. o Check your blood sugar the morning of your surgery when you wake up and every 2 hours until you get to the Short Stay unit. . If your blood sugar is less than 70 mg/dL, you will need to treat for low blood sugar: o Do not take insulin. o Treat a low blood sugar (less than 70 mg/dL) with  cup of clear juice (cranberry or apple), 4 glucose tablets, OR glucose gel. o Recheck blood sugar in 15 minutes after treatment (to make sure it is greater than 70 mg/dL). If your blood sugar is not greater than 70 mg/dL on recheck, call [phone] for further instructions. . Report your blood sugar to the short stay nurse when you get to Short Stay.  . If you are admitted to the hospital after surgery: o Your blood sugar will be checked by the staff and you will probably be given insulin after surgery (instead of oral diabetes medicines) to make sure you have good blood sugar levels. o The goal for blood sugar control after surgery is 80-180 mg/dL.     Do not wear jewelry, make-up or nail polish.  Do not wear lotions, powders, or perfumes, or deoderant.  Do not shave 48 hours prior to surgery.  Men may shave face and neck.  Do not bring valuables to the hospital.  [facility] is not responsible for any belongings or valuables.  Contacts, dentures or bridgework may not be worn into surgery.  Leave your suitcase in the car.  After surgery it may be brought to your room.  For patients admitted to the hospital, discharge time will be determined by your treatment team.  Patients discharged the day of surgery will not be allowed to drive home.    Special instructions:   [facility]- Preparing For Surgery  Before surgery, you can play an important role. Because skin is not sterile, your skin needs to be as free of germs as  possible. You can reduce the number of germs on your skin by washing with CHG (chlorahexidine gluconate) Soap before surgery.  CHG is an antiseptic cleaner which kills germs and bonds with the skin to continue killing germs even after washing.  Please do not use if you have an allergy to CHG or antibacterial soaps. If your skin becomes reddened/irritated stop using the CHG.  Do not shave (including legs and underarms) for at least 48 hours prior to first CHG shower. It is OK to shave your face.  Please follow these instructions carefully.   1. Shower the NIGHT BEFORE SURGERY and the MORNING OF SURGERY with CHG.   2. If you chose to wash your hair, wash your hair first as usual with your normal shampoo.  3. After you shampoo, rinse your hair and body thoroughly to remove the shampoo.  4. Use CHG as you would any other liquid soap. You can apply CHG directly to the skin and wash gently with a scrungie or a clean washcloth.   5. Apply the CHG Soap to your body ONLY FROM THE NECK DOWN.  Do not use on open wounds or open sores. Avoid contact with your eyes, ears, mouth and genitals (private parts). Wash genitals (private parts) with your normal soap.  6. Wash thoroughly, paying special attention to the area where your surgery will be performed.  7. Thoroughly rinse your body with warm water from the neck down.  8. DO NOT shower/wash with your normal soap after using and rinsing off the CHG Soap.  9. Pat yourself dry with a CLEAN TOWEL.   10. Wear CLEAN PAJAMAS   11. Place CLEAN SHEETS on your bed the night of your first shower and DO NOT SLEEP WITH PETS.    Day of Surgery: Do not apply any deodorants/lotions. Please wear clean clothes to the hospital/surgery center.      Please read over the following fact sheets that you were given. Coughing and Deep Breathing, MRSA Information and Surgical Site Infection Prevention

## 2016-07-04 ENCOUNTER — Encounter (HOSPITAL_COMMUNITY): Payer: Self-pay

## 2016-07-04 ENCOUNTER — Encounter (HOSPITAL_COMMUNITY)
Admission: RE | Admit: 2016-07-04 | Discharge: 2016-07-04 | Disposition: A | Discharge status: Home / Self Care | Payer: Medicaid Other | Source: Ambulatory Visit | Attending: Orthopedic Surgery | Admitting: Orthopedic Surgery

## 2016-07-04 DIAGNOSIS — G9589 Other specified diseases of spinal cord: Secondary | ICD-10-CM | POA: Insufficient documentation

## 2016-07-04 DIAGNOSIS — Z01812 Encounter for preprocedural laboratory examination: Secondary | ICD-10-CM | POA: Diagnosis not present

## 2016-07-04 DIAGNOSIS — Z01818 Encounter for other preprocedural examination: Secondary | ICD-10-CM

## 2016-07-04 DIAGNOSIS — Z01810 Encounter for preprocedural cardiovascular examination: Secondary | ICD-10-CM | POA: Diagnosis present

## 2016-07-04 HISTORY — DX: Acute myocardial infarction, unspecified: I21.9

## 2016-07-04 HISTORY — DX: Prediabetes: R73.03

## 2016-07-04 LAB — CBC WITH DIFFERENTIAL/PLATELET
Basophils Absolute: 0 10*3/uL (ref 0.0–0.1)
Basophils Relative: 0 %
EOS ABS: 0.1 10*3/uL (ref 0.0–0.7)
Eosinophils Relative: 2 %
HEMATOCRIT: 43.7 % (ref 39.0–52.0)
HEMOGLOBIN: 14.5 g/dL (ref 13.0–17.0)
LYMPHS ABS: 2.1 10*3/uL (ref 0.7–4.0)
LYMPHS PCT: 45 %
MCH: 28.7 pg (ref 26.0–34.0)
MCHC: 33.2 g/dL (ref 30.0–36.0)
MCV: 86.4 fL (ref 78.0–100.0)
Monocytes Absolute: 0.3 10*3/uL (ref 0.1–1.0)
Monocytes Relative: 7 %
NEUTROS ABS: 2.1 10*3/uL (ref 1.7–7.7)
NEUTROS PCT: 46 %
Platelets: 199 10*3/uL (ref 150–400)
RBC: 5.06 MIL/uL (ref 4.22–5.81)
RDW: 13.2 % (ref 11.5–15.5)
WBC: 4.6 10*3/uL (ref 4.0–10.5)

## 2016-07-04 LAB — SURGICAL PCR SCREEN
MRSA, PCR: NEGATIVE
Staphylococcus aureus: NEGATIVE

## 2016-07-04 LAB — APTT: aPTT: 29 seconds (ref 24–36)

## 2016-07-04 LAB — URINALYSIS, ROUTINE W REFLEX MICROSCOPIC
Bilirubin Urine: NEGATIVE
Glucose, UA: NEGATIVE mg/dL
Hgb urine dipstick: NEGATIVE
KETONES UR: NEGATIVE mg/dL
LEUKOCYTES UA: NEGATIVE
NITRITE: NEGATIVE
PH: 5 (ref 5.0–8.0)
Protein, ur: NEGATIVE mg/dL
SPECIFIC GRAVITY, URINE: 1.014 (ref 1.005–1.030)

## 2016-07-04 LAB — COMPREHENSIVE METABOLIC PANEL
ALK PHOS: 55 U/L (ref 38–126)
ALT: 19 U/L (ref 17–63)
AST: 26 U/L (ref 15–41)
Albumin: 3.9 g/dL (ref 3.5–5.0)
Anion gap: 7 (ref 5–15)
BILIRUBIN TOTAL: 0.9 mg/dL (ref 0.3–1.2)
BUN: 16 mg/dL (ref 6–20)
CO2: 25 mmol/L (ref 22–32)
Calcium: 9.7 mg/dL (ref 8.9–10.3)
Chloride: 103 mmol/L (ref 101–111)
Creatinine, Ser: 1.03 mg/dL (ref 0.61–1.24)
GFR calc non Af Amer: >60 mL/min (ref >60)
GLUCOSE: 98 mg/dL (ref 65–99)
Potassium: 4.2 mmol/L (ref 3.5–5.1)
SODIUM: 135 mmol/L (ref 135–145)
Total Protein: 6.8 g/dL (ref 6.5–8.1)

## 2016-07-04 LAB — GLUCOSE, CAPILLARY: Glucose-Capillary: 95 mg/dL (ref 65–99)

## 2016-07-04 LAB — PROTIME-INR
INR: 0.94
PROTHROMBIN TIME: 12.6 s (ref 11.4–15.2)

## 2016-07-05 ENCOUNTER — Encounter (HOSPITAL_COMMUNITY): Payer: Self-pay

## 2016-07-05 LAB — HEMOGLOBIN A1C
Hgb A1c MFr Bld: 6.2 % — ABNORMAL HIGH (ref 4.8–5.6)
MEAN PLASMA GLUCOSE: 131 mg/dL

## 2016-07-05 NOTE — Progress Notes (Signed)
Anesthesia Chart Review:  Pt is a 60 year old male scheduled for C4-5, C5-6, C6-7 ACDF on 07/10/2016 with [name], MD.   Cardiologist is Quay Burow, MD who cleared pt for surgery at mildly elevated risk at last office visit 06/25/16.    PMH includes:  Nonischemic cardiomyopathy, chronic combined systolic and diastolic CHF, HTN, pre-diabetes, seizures (with penicillin). Former smoker. BMI 24  Medications include: carvedilol, hctz, lisinopril, metformin, sildenafil  Preoperative labs reviewed.  HgbA1c 6.2, glucose 98  CXR 07/04/16:  1. Borderline cardiomegaly. No evidence of pulmonary venous congestion. 2. No acute pulmonary disease.  EKG 07/04/16: Sinus bradycardia (58 bpm). Nonspecific T wave abnormality  Echo 06/21/16:  - Left ventricle: Abnormal septal motion The cavity size was moderately dilated. Wall thickness was increased in a pattern of mild LVH. Systolic function was moderately reduced. The estimated ejection fraction was in the range of 35% to 40%. Diffuse hypokinesis. The study is not technically sufficient to allow evaluation of LV diastolic function. - Mitral valve: There was mild regurgitation. - Atrial septum: No defect or patent foramen ovale was identified.  Cardiac cath 03/11/16:   There is mild to moderate left ventricular systolic dysfunction.  Angiographically normal coronary arteries with a codominant system  Mild-moderately elevated LVEDP Conclusion: Nonischemic Cardiomyopathy   Pt was originally scheduled for this surgery last May.  His surgery was postponed so he could be evaluated by cardiology for an abnormal EKG. On echo and stress testing, he was found to have an EF of ~25%. Determined to be NICM. Tx with carvedilol and lisinopril. EF now in 35-40% range and cardiologist has cleared pt for surgery.   If no changes, I anticipate pt can proceed with surgery as scheduled.   Willeen Cass, FNP-BC [facility] Short Stay Surgical [facility]/Anesthesiology Phone:  [phone] 07/05/2016 2:27 PM

## 2016-07-09 NOTE — H&P (Signed)
PREOPERATIVE H&P  Chief Complaint: Deterioration of fine motor skills and balance  HPI: Jonathan Dickson is a 60 y.o. male who presents with ongoing progressive weakness in his upper and lower extremities as well as deterioration in balance.  MRI reveals severe right-sided neuroforaminal stenosis noted at C6-7.  Moderate bilateral neuroforaminal stenosis is noted at C4-5.  + deflexion of the anterior spinal cord is noted at C6-7.  Patient has failed multiple forms of conservative care and continues to have pain (see office notes for additional details regarding the patient's full course of treatment)  Past Medical History:  Diagnosis Date  . Abnormal TSH 02/2016  . Anxiety   . Cervical disc disease   . Chronic combined systolic and diastolic CHF (congestive heart failure) ([facility])    a. 01/2016 Echo: EF 25%, inf AK, diffuse sev HK, Gr1 DD, mild MR.  . Depression   . Hyperglycemia   . Hypertension   . Hypertensive heart disease   . Myocardial infarction    ??  maybe  . NICM (nonischemic cardiomyopathy) ([facility])    a. 01/2016 Echo: EF 25%, inf AK, diffuse sev HK, Gr1 DD, mild MR; b. 01/2016 MV: EF 22%, no isch/infarct;  c. 02/2016 Cath: Nl cors, EF 35-45%.  . Pre-diabetes   . Seizures ([place])    with penicillin   Past Surgical History:  Procedure Laterality Date  . CARDIAC CATHETERIZATION N/A 03/11/2016   Procedure: Left Heart Cath and Coronary Angiography;  Surgeon: [name], MD;  Location: [facility];  Service: Cardiovascular;  Laterality: N/A;  . Thumb surgery Right    Social History   Social History  . Marital status: Legally Separated    Spouse name: N/A  . Number of children: 3  . Years of education: N/A   Social History Main Topics  . Smoking status: Former Smoker    Packs/day: 1.50    Years: 8.00    Quit date: 03/15/2003  . Smokeless tobacco: Never Used  . Alcohol use No     Comment: none since 2004  . Drug use: No     Comment: former  none since 2004    . Sexual activity: Not on file   Other Topics Concern  . Not on file   Social History Narrative  . No narrative on file   Family History  Problem Relation Age of Onset  . Diabetes Mother   . Diabetes Sister    Allergies  Allergen Reactions  . Peanut-Containing Drug Products Swelling  . Penicillins Other (See Comments)    Convulsions Has patient had a PCN reaction causing immediate rash, facial/tongue/throat swelling, SOB or lightheadedness with hypotension: No Has patient had a PCN reaction causing severe rash involving mucus membranes or skin necrosis: No Has patient had a PCN reaction that required hospitalization No Has patient had a PCN reaction occurring within the last 10 years: No If all of the above answers are "NO", then may proceed with Cephalosporin use.    Prior to Admission medications   Medication Sig Start Date End Date Taking? Authorizing Provider  carvedilol (COREG) 6.25 MG tablet Take 1.5 tablets (9.375 mg total) by mouth 2 (two) times daily. 06/25/16  Yes Lorretta Harp, MD  cholecalciferol (VITAMIN D) 1000 units tablet Take 1,000 Units by mouth daily.   Yes Historical Provider, MD  hydrochlorothiazide (HYDRODIURIL) 25 MG tablet Take 1 tablet (25 mg total) by mouth daily. 03/21/16  Yes Rogelia Mire, NP  lisinopril (PRINIVIL,ZESTRIL) 40 MG tablet Take 1 tablet (40 mg total) by mouth daily. Must have OV 03/21/16  Yes Rogelia Mire, NP  metFORMIN (GLUCOPHAGE XR) 500 MG 24 hr tablet Take 1 tablet (500 mg total) by mouth daily with breakfast. 05/28/16  Yes Maren Reamer, MD  methocarbamol (ROBAXIN) 500 MG tablet Take 500 mg by mouth 2 (two) times daily.    Yes Historical Provider, MD  traMADol (ULTRAM) 50 MG tablet Take 50 mg by mouth 2 (two) times daily.    Yes Historical Provider, MD  ibuprofen (ADVIL,MOTRIN) 400 MG tablet Take 1 tablet (400 mg total) by mouth every 6 (six) hours as needed for mild pain or moderate pain. Patient not taking: Reported  on 07/02/2016 02/20/16   Junius Creamer, NP  sildenafil (VIAGRA) 25 MG tablet Take 1 tablet (25 mg total) by mouth daily as needed for erectile dysfunction. Patient not taking: Reported on 06/25/2016 05/22/16   Maren Reamer, MD     All other systems have been reviewed and were otherwise negative with the exception of those mentioned in the HPI and as above.  Physical Exam: There were no vitals filed for this visit.  General: Alert, no acute distress Cardiovascular: No pedal edema Respiratory: No cyanosis, no use of accessory musculature Skin: No lesions in the area of chief complaint Neurologic: Sensation intact distally Psychiatric: Patient is competent for consent with normal mood and affect Lymphatic: No axillary or cervical lymphadenopathy  MUSCULOSKELETAL: The patient has a markedly positive tandem gait a markedly positive Romberg sign  Assessment/Plan: Cervical myelopathy  Plan for Procedure(s): ANTERIOR CERVICAL DECOMPRESSION FUSION CERVICAL 4-5, CERVICAL 5-6, CERVICAL 6-7 WITH INSTRUMENTATION AND ALLOGRAFT   Sinclair Ship, MD 07/09/2016 9:50 AM

## 2016-07-10 ENCOUNTER — Inpatient Hospital Stay (HOSPITAL_COMMUNITY)
Admission: RE | Admit: 2016-07-10 | Discharge: 2016-07-11 | DRG: 029 | Disposition: A | Discharge status: Home / Self Care | Payer: Medicaid Other | Source: Ambulatory Visit | Attending: Orthopedic Surgery | Admitting: Orthopedic Surgery

## 2016-07-10 ENCOUNTER — Ambulatory Visit (HOSPITAL_COMMUNITY): Payer: Medicaid Other

## 2016-07-10 ENCOUNTER — Encounter (HOSPITAL_COMMUNITY)
Admission: RE | Disposition: A | Discharge status: Home / Self Care | Payer: Self-pay | Source: Ambulatory Visit | Attending: Orthopedic Surgery

## 2016-07-10 ENCOUNTER — Ambulatory Visit (HOSPITAL_COMMUNITY): Payer: Medicaid Other | Admitting: Emergency Medicine

## 2016-07-10 ENCOUNTER — Encounter (HOSPITAL_COMMUNITY): Payer: Self-pay | Admitting: Urology

## 2016-07-10 ENCOUNTER — Ambulatory Visit (HOSPITAL_COMMUNITY): Payer: Medicaid Other | Admitting: Certified Registered Nurse Anesthetist

## 2016-07-10 DIAGNOSIS — Z87891 Personal history of nicotine dependence: Secondary | ICD-10-CM

## 2016-07-10 DIAGNOSIS — M40202 Unspecified kyphosis, cervical region: Secondary | ICD-10-CM | POA: Diagnosis present

## 2016-07-10 DIAGNOSIS — Z833 Family history of diabetes mellitus: Secondary | ICD-10-CM | POA: Diagnosis not present

## 2016-07-10 DIAGNOSIS — Z419 Encounter for procedure for purposes other than remedying health state, unspecified: Secondary | ICD-10-CM

## 2016-07-10 DIAGNOSIS — M5000 Cervical disc disorder with myelopathy, unspecified cervical region: Secondary | ICD-10-CM | POA: Diagnosis present

## 2016-07-10 DIAGNOSIS — I110 Hypertensive heart disease with heart failure: Secondary | ICD-10-CM | POA: Diagnosis present

## 2016-07-10 DIAGNOSIS — G959 Disease of spinal cord, unspecified: Secondary | ICD-10-CM | POA: Diagnosis present

## 2016-07-10 DIAGNOSIS — I5042 Chronic combined systolic (congestive) and diastolic (congestive) heart failure: Secondary | ICD-10-CM | POA: Diagnosis present

## 2016-07-10 DIAGNOSIS — I252 Old myocardial infarction: Secondary | ICD-10-CM | POA: Diagnosis not present

## 2016-07-10 DIAGNOSIS — R531 Weakness: Secondary | ICD-10-CM | POA: Diagnosis present

## 2016-07-10 HISTORY — PX: ANTERIOR CERVICAL DECOMP/DISCECTOMY FUSION: SHX1161

## 2016-07-10 LAB — GLUCOSE, CAPILLARY
GLUCOSE-CAPILLARY: 74 mg/dL (ref 65–99)
GLUCOSE-CAPILLARY: 101 mg/dL — AB (ref 65–99)
GLUCOSE-CAPILLARY: 127 mg/dL — AB (ref 65–99)

## 2016-07-10 SURGERY — ANTERIOR CERVICAL DECOMPRESSION/DISCECTOMY FUSION 3 LEVELS
Anesthesia: General | Site: Spine Cervical

## 2016-07-10 MED ORDER — OXYCODONE HCL 5 MG PO TABS
ORAL_TABLET | ORAL | Status: AC
  Filled 2016-07-10: qty 1

## 2016-07-10 MED ORDER — LIDOCAINE 2% (20 MG/ML) 5 ML SYRINGE
INTRAMUSCULAR | Status: AC
Start: 2016-07-10 — End: 2016-07-10
  Filled 2016-07-10: qty 5

## 2016-07-10 MED ORDER — FENTANYL CITRATE (PF) 100 MCG/2ML IJ SOLN
INTRAMUSCULAR | Status: AC
  Filled 2016-07-10: qty 4

## 2016-07-10 MED ORDER — MIDAZOLAM HCL 2 MG/2ML IJ SOLN
INTRAMUSCULAR | Status: AC
  Filled 2016-07-10: qty 2

## 2016-07-10 MED ORDER — SODIUM CHLORIDE 0.9% FLUSH: 3.0000 mL | INTRAVENOUS | Status: DC | PRN

## 2016-07-10 MED ORDER — DOCUSATE SODIUM 100 MG PO CAPS
100.0000 mg | ORAL_CAPSULE | Freq: Two times a day (BID) | ORAL | Status: DC
  Administered 2016-07-10 – 2016-07-11 (×2): 100 mg via ORAL
  Filled 2016-07-10 (×2): qty 1

## 2016-07-10 MED ORDER — FENTANYL CITRATE (PF) 100 MCG/2ML IJ SOLN
INTRAMUSCULAR | Status: DC | PRN
  Administered 2016-07-10: 150 ug via INTRAVENOUS
  Administered 2016-07-10 (×2): 50 ug via INTRAVENOUS

## 2016-07-10 MED ORDER — FENTANYL CITRATE (PF) 100 MCG/2ML IJ SOLN
INTRAMUSCULAR | Status: AC
  Filled 2016-07-10: qty 2

## 2016-07-10 MED ORDER — ZOLPIDEM TARTRATE 5 MG PO TABS: 5.0000 mg | ORAL_TABLET | Freq: Every evening | ORAL | Status: DC | PRN

## 2016-07-10 MED ORDER — VANCOMYCIN HCL IN DEXTROSE 1-5 GM/200ML-% IV SOLN
INTRAVENOUS | Status: AC
  Filled 2016-07-10: qty 200

## 2016-07-10 MED ORDER — BUPIVACAINE-EPINEPHRINE (PF) 0.25% -1:200000 IJ SOLN
INTRAMUSCULAR | Status: AC
  Filled 2016-07-10: qty 30

## 2016-07-10 MED ORDER — ROCURONIUM BROMIDE 100 MG/10ML IV SOLN
INTRAVENOUS | Status: DC | PRN
  Administered 2016-07-10: 50 mg via INTRAVENOUS
  Administered 2016-07-10 (×2): 10 mg via INTRAVENOUS

## 2016-07-10 MED ORDER — SODIUM CHLORIDE 0.9% FLUSH
3.0000 mL | Freq: Two times a day (BID) | INTRAVENOUS | Status: DC
  Administered 2016-07-10: 3 mL via INTRAVENOUS

## 2016-07-10 MED ORDER — 0.9 % SODIUM CHLORIDE (POUR BTL) OPTIME
TOPICAL | Status: DC | PRN
  Administered 2016-07-10: 1000 mL

## 2016-07-10 MED ORDER — ALUM & MAG HYDROXIDE-SIMETH 200-200-20 MG/5ML PO SUSP: 30.0000 mL | Freq: Four times a day (QID) | ORAL | Status: DC | PRN

## 2016-07-10 MED ORDER — THROMBIN 20000 UNITS EX KIT
PACK | CUTANEOUS | Status: DC | PRN
  Administered 2016-07-10: 20000 [IU] via TOPICAL

## 2016-07-10 MED ORDER — SUGAMMADEX SODIUM 200 MG/2ML IV SOLN
INTRAVENOUS | Status: DC | PRN
  Administered 2016-07-10: 150 mg via INTRAVENOUS

## 2016-07-10 MED ORDER — THROMBIN 20000 UNITS EX SOLR
CUTANEOUS | Status: AC
  Filled 2016-07-10: qty 20000

## 2016-07-10 MED ORDER — OXYCODONE HCL 5 MG PO TABS
5.0000 mg | ORAL_TABLET | Freq: Once | ORAL | Status: AC | PRN
  Administered 2016-07-10: 5 mg via ORAL

## 2016-07-10 MED ORDER — MORPHINE SULFATE (PF) 2 MG/ML IV SOLN
1.0000 mg | INTRAVENOUS | Status: DC | PRN
  Administered 2016-07-10: 2 mg via INTRAVENOUS
  Filled 2016-07-10: qty 1

## 2016-07-10 MED ORDER — BUPIVACAINE-EPINEPHRINE 0.25% -1:200000 IJ SOLN
INTRAMUSCULAR | Status: DC | PRN
  Administered 2016-07-10: 2 mL

## 2016-07-10 MED ORDER — POVIDONE-IODINE 7.5 % EX SOLN
Freq: Once | CUTANEOUS | Status: DC
Start: 2016-07-10 — End: 2016-07-10

## 2016-07-10 MED ORDER — PROPOFOL 10 MG/ML IV BOLUS
INTRAVENOUS | Status: DC | PRN
  Administered 2016-07-10: 200 mg via INTRAVENOUS

## 2016-07-10 MED ORDER — FLEET ENEMA 7-19 GM/118ML RE ENEM: 1.0000 | ENEMA | Freq: Once | RECTAL | Status: DC | PRN

## 2016-07-10 MED ORDER — VANCOMYCIN HCL IN DEXTROSE 1-5 GM/200ML-% IV SOLN
1000.0000 mg | INTRAVENOUS | Status: AC
  Administered 2016-07-10: 1000 mg via INTRAVENOUS

## 2016-07-10 MED ORDER — SENNOSIDES-DOCUSATE SODIUM 8.6-50 MG PO TABS: 1.0000 | ORAL_TABLET | Freq: Every evening | ORAL | Status: DC | PRN

## 2016-07-10 MED ORDER — ROCURONIUM BROMIDE 10 MG/ML (PF) SYRINGE
PREFILLED_SYRINGE | INTRAVENOUS | Status: AC
  Filled 2016-07-10: qty 10

## 2016-07-10 MED ORDER — LIDOCAINE HCL (CARDIAC) 20 MG/ML IV SOLN
INTRAVENOUS | Status: DC | PRN
  Administered 2016-07-10: 100 mg via INTRAVENOUS

## 2016-07-10 MED ORDER — PROPOFOL 10 MG/ML IV BOLUS
INTRAVENOUS | Status: AC
  Filled 2016-07-10: qty 20

## 2016-07-10 MED ORDER — ONDANSETRON HCL 4 MG/2ML IJ SOLN
INTRAMUSCULAR | Status: DC | PRN
  Administered 2016-07-10: 4 mg via INTRAVENOUS

## 2016-07-10 MED ORDER — HYDROMORPHONE HCL 1 MG/ML IJ SOLN
INTRAMUSCULAR | Status: AC
  Filled 2016-07-10: qty 1

## 2016-07-10 MED ORDER — LACTATED RINGERS IV SOLN
INTRAVENOUS | Status: DC
  Administered 2016-07-10 (×2): via INTRAVENOUS

## 2016-07-10 MED ORDER — VANCOMYCIN HCL IN DEXTROSE 1-5 GM/200ML-% IV SOLN
1000.0000 mg | Freq: Once | INTRAVENOUS | Status: AC
  Administered 2016-07-11: 1000 mg via INTRAVENOUS
  Filled 2016-07-10: qty 200

## 2016-07-10 MED ORDER — ACETAMINOPHEN 650 MG RE SUPP
650.0000 mg | RECTAL | Status: DC | PRN
Start: 2016-07-10 — End: 2016-07-11

## 2016-07-10 MED ORDER — VITAMIN D 1000 UNITS PO TABS
1000.0000 [IU] | ORAL_TABLET | Freq: Every day | ORAL | Status: DC
  Administered 2016-07-11: 1000 [IU] via ORAL
  Filled 2016-07-10: qty 1

## 2016-07-10 MED ORDER — HYDROMORPHONE HCL 1 MG/ML IJ SOLN
0.2500 mg | INTRAMUSCULAR | Status: DC | PRN
  Administered 2016-07-10 (×4): 0.5 mg via INTRAVENOUS

## 2016-07-10 MED ORDER — ONDANSETRON HCL 4 MG/2ML IJ SOLN
INTRAMUSCULAR | Status: AC
  Filled 2016-07-10: qty 2

## 2016-07-10 MED ORDER — PHENOL 1.4 % MT LIQD
1.0000 | OROMUCOSAL | Status: DC | PRN
  Administered 2016-07-11: 1 via OROMUCOSAL
  Filled 2016-07-10: qty 177

## 2016-07-10 MED ORDER — MENTHOL 3 MG MT LOZG: 1.0000 | LOZENGE | OROMUCOSAL | Status: DC | PRN

## 2016-07-10 MED ORDER — MIDAZOLAM HCL 5 MG/5ML IJ SOLN
INTRAMUSCULAR | Status: DC | PRN
  Administered 2016-07-10: 2 mg via INTRAVENOUS

## 2016-07-10 MED ORDER — LISINOPRIL 20 MG PO TABS
40.0000 mg | ORAL_TABLET | Freq: Every day | ORAL | Status: DC
  Administered 2016-07-11: 40 mg via ORAL
  Filled 2016-07-10: qty 2

## 2016-07-10 MED ORDER — ONDANSETRON HCL 4 MG/2ML IJ SOLN: 4.0000 mg | INTRAMUSCULAR | Status: DC | PRN

## 2016-07-10 MED ORDER — CARVEDILOL 6.25 MG PO TABS
9.3750 mg | ORAL_TABLET | Freq: Two times a day (BID) | ORAL | Status: DC
  Administered 2016-07-10 – 2016-07-11 (×2): 9.375 mg via ORAL
  Filled 2016-07-10 (×2): qty 1

## 2016-07-10 MED ORDER — BISACODYL 5 MG PO TBEC: 5.0000 mg | DELAYED_RELEASE_TABLET | Freq: Every day | ORAL | Status: DC | PRN

## 2016-07-10 MED ORDER — HYDROCHLOROTHIAZIDE 25 MG PO TABS
25.0000 mg | ORAL_TABLET | Freq: Every day | ORAL | Status: DC
  Administered 2016-07-11: 25 mg via ORAL
  Filled 2016-07-10: qty 1

## 2016-07-10 MED ORDER — METFORMIN HCL ER 500 MG PO TB24
500.0000 mg | ORAL_TABLET | Freq: Every day | ORAL | Status: DC
  Administered 2016-07-11: 500 mg via ORAL
  Filled 2016-07-10: qty 1

## 2016-07-10 MED ORDER — SODIUM CHLORIDE 0.9 % IV SOLN: 250.0000 mL | INTRAVENOUS | Status: DC

## 2016-07-10 MED ORDER — PHENYLEPHRINE HCL 10 MG/ML IJ SOLN
INTRAMUSCULAR | Status: DC | PRN
  Administered 2016-07-10: 40 ug via INTRAVENOUS
  Administered 2016-07-10: 80 ug via INTRAVENOUS

## 2016-07-10 MED ORDER — DIAZEPAM 5 MG PO TABS: 5.0000 mg | ORAL_TABLET | Freq: Four times a day (QID) | ORAL | Status: DC | PRN

## 2016-07-10 MED ORDER — OXYCODONE HCL 5 MG/5ML PO SOLN: 5.0000 mg | Freq: Once | ORAL | Status: AC | PRN

## 2016-07-10 MED ORDER — ONDANSETRON HCL 4 MG/2ML IJ SOLN: 4.0000 mg | Freq: Once | INTRAMUSCULAR | Status: DC | PRN

## 2016-07-10 MED ORDER — PHENYLEPHRINE HCL 10 MG/ML IJ SOLN
INTRAVENOUS | Status: DC | PRN
  Administered 2016-07-10: 30 ug/min via INTRAVENOUS

## 2016-07-10 MED ORDER — ACETAMINOPHEN 325 MG PO TABS: 650.0000 mg | ORAL_TABLET | ORAL | Status: DC | PRN

## 2016-07-10 MED ORDER — SUGAMMADEX SODIUM 200 MG/2ML IV SOLN
INTRAVENOUS | Status: AC
  Filled 2016-07-10: qty 2

## 2016-07-10 MED ORDER — OXYCODONE-ACETAMINOPHEN 5-325 MG PO TABS
1.0000 | ORAL_TABLET | ORAL | Status: DC | PRN
  Administered 2016-07-11: 2 via ORAL
  Filled 2016-07-10: qty 2

## 2016-07-10 SURGICAL SUPPLY — 77 items
BENZOIN TINCTURE PRP APPL 2/3 (GAUZE/BANDAGES/DRESSINGS) ×3 IMPLANT
BIT DRILL NEURO 2X3.1 SFT TUCH (MISCELLANEOUS) ×1 IMPLANT
BIT DRILL SKYLINE 12MM (BIT) ×1 IMPLANT
BLADE SURG 15 STRL LF DISP TIS (BLADE) ×1 IMPLANT
BLADE SURG 15 STRL SS (BLADE) ×2
BLADE SURG ROTATE 9660 (MISCELLANEOUS) ×3 IMPLANT
CANISTER SUCT 3000ML (MISCELLANEOUS) ×3 IMPLANT
CARTRIDGE OIL MAESTRO DRILL (MISCELLANEOUS) ×1 IMPLANT
CLOSURE STERI-STRIP 1/2X4 (GAUZE/BANDAGES/DRESSINGS) ×1
CLOSURE WOUND 1/2 X4 (GAUZE/BANDAGES/DRESSINGS) ×1
CLSR STERI-STRIP ANTIMIC 1/2X4 (GAUZE/BANDAGES/DRESSINGS) ×2 IMPLANT
COVER SURGICAL LIGHT HANDLE (MISCELLANEOUS) ×3 IMPLANT
CRADLE DONUT ADULT HEAD (MISCELLANEOUS) ×3 IMPLANT
DIFFUSER DRILL AIR PNEUMATIC (MISCELLANEOUS) ×3 IMPLANT
DRAIN JACKSON RD 7FR 3/32 (WOUND CARE) IMPLANT
DRAPE C-ARM 42X72 X-RAY (DRAPES) ×3 IMPLANT
DRAPE POUCH INSTRU U-SHP 10X18 (DRAPES) ×3 IMPLANT
DRAPE SURG 17X23 STRL (DRAPES) ×9 IMPLANT
DRILL BIT SKYLINE 12MM (BIT) ×2
DRILL NEURO 2X3.1 SOFT TOUCH (MISCELLANEOUS) ×3
DURAPREP 6ML APPLICATOR 50/CS (WOUND CARE) ×3 IMPLANT
ELECT COATED BLADE 2.86 ST (ELECTRODE) ×3 IMPLANT
ELECT REM PT RETURN 9FT ADLT (ELECTROSURGICAL) ×3
ELECTRODE REM PT RTRN 9FT ADLT (ELECTROSURGICAL) ×1 IMPLANT
EVACUATOR SILICONE 100CC (DRAIN) IMPLANT
GAUZE SPONGE 4X4 12PLY STRL (GAUZE/BANDAGES/DRESSINGS) ×3 IMPLANT
GAUZE SPONGE 4X4 16PLY XRAY LF (GAUZE/BANDAGES/DRESSINGS) ×3 IMPLANT
GLOVE BIO SURGEON STRL SZ7 (GLOVE) ×3 IMPLANT
GLOVE BIO SURGEON STRL SZ8 (GLOVE) ×3 IMPLANT
GLOVE BIOGEL PI IND STRL 7.0 (GLOVE) ×1 IMPLANT
GLOVE BIOGEL PI IND STRL 8 (GLOVE) ×1 IMPLANT
GLOVE BIOGEL PI INDICATOR 7.0 (GLOVE) ×2
GLOVE BIOGEL PI INDICATOR 8 (GLOVE) ×2
GOWN STRL REUS W/ TWL LRG LVL3 (GOWN DISPOSABLE) ×1 IMPLANT
GOWN STRL REUS W/ TWL XL LVL3 (GOWN DISPOSABLE) ×1 IMPLANT
GOWN STRL REUS W/TWL LRG LVL3 (GOWN DISPOSABLE) ×2
GOWN STRL REUS W/TWL XL LVL3 (GOWN DISPOSABLE) ×2
INTERLOCK LRDTC CRVCL VBR 6MM (Bone Implant) ×1 IMPLANT
INTERLOCK LRDTC CRVCL VBR SM (Bone Implant) ×2 IMPLANT
IV CATH 14GX2 1/4 (CATHETERS) ×3 IMPLANT
KIT BASIN OR (CUSTOM PROCEDURE TRAY) ×3 IMPLANT
KIT ROOM TURNOVER OR (KITS) ×3 IMPLANT
LORDOTIC CERVICAL VBR 6MM SM (Bone Implant) ×3 IMPLANT
LORDOTIC CERVICAL VBR SM 5MM (Bone Implant) ×6 IMPLANT
NEEDLE 27GAX1X1/2 (NEEDLE) ×3 IMPLANT
NEEDLE SPNL 20GX3.5 QUINCKE YW (NEEDLE) ×3 IMPLANT
NS IRRIG 1000ML POUR BTL (IV SOLUTION) ×3 IMPLANT
OIL CARTRIDGE MAESTRO DRILL (MISCELLANEOUS) ×3
PACK ORTHO CERVICAL (CUSTOM PROCEDURE TRAY) ×3 IMPLANT
PAD ARMBOARD 7.5X6 YLW CONV (MISCELLANEOUS) ×3 IMPLANT
PATTIES SURGICAL .5 X.5 (GAUZE/BANDAGES/DRESSINGS) IMPLANT
PATTIES SURGICAL .5 X1 (DISPOSABLE) IMPLANT
PIN DISTRACTION 12MM (PIN) ×4
PIN DSTRCT 12XNS SS ACIS (PIN) ×2 IMPLANT
PLATE SKYLINE 3LVL 42MM (Plate) ×3 IMPLANT
PUTTY BONE DBX 5CC MIX (Putty) ×3 IMPLANT
SCREW SKYLINE VAR OS 14MM (Screw) ×18 IMPLANT
SCREW SKYLINE VARIABLE LG (Screw) ×6 IMPLANT
SPONGE GAUZE 4X4 12PLY STER LF (GAUZE/BANDAGES/DRESSINGS) ×3 IMPLANT
SPONGE INTESTINAL PEANUT (DISPOSABLE) ×3 IMPLANT
SPONGE SURGIFOAM ABS GEL 100 (HEMOSTASIS) IMPLANT
STRIP CLOSURE SKIN 1/2X4 (GAUZE/BANDAGES/DRESSINGS) ×2 IMPLANT
SURGIFLO W/THROMBIN 8M KIT (HEMOSTASIS) IMPLANT
SUT MNCRL AB 4-0 PS2 18 (SUTURE) ×3 IMPLANT
SUT SILK 4 0 (SUTURE)
SUT SILK 4-0 18XBRD TIE 12 (SUTURE) IMPLANT
SUT VIC AB 2-0 CT2 18 VCP726D (SUTURE) ×3 IMPLANT
SYR BULB IRRIGATION 50ML (SYRINGE) ×3 IMPLANT
SYR CONTROL 10ML LL (SYRINGE) ×3 IMPLANT
TAPE CLOTH 4X10 WHT NS (GAUZE/BANDAGES/DRESSINGS) ×3 IMPLANT
TAPE CLOTH SURG 4X10 WHT LF (GAUZE/BANDAGES/DRESSINGS) ×3 IMPLANT
TAPE UMBILICAL COTTON 1/8X30 (MISCELLANEOUS) ×3 IMPLANT
TOWEL OR 17X24 6PK STRL BLUE (TOWEL DISPOSABLE) ×3 IMPLANT
TOWEL OR 17X26 10 PK STRL BLUE (TOWEL DISPOSABLE) ×3 IMPLANT
TRAY FOLEY CATH 16FRSI W/METER (SET/KITS/TRAYS/PACK) ×3 IMPLANT
WATER STERILE IRR 1000ML POUR (IV SOLUTION) ×3 IMPLANT
YANKAUER SUCT BULB TIP NO VENT (SUCTIONS) ×3 IMPLANT

## 2016-07-10 NOTE — Anesthesia Procedure Notes (Signed)
Procedure Name: Intubation Date/Time: 07/10/2016 1:37 PM Performed by: Candis Shine Pre-anesthesia Checklist: Patient identified, Emergency Drugs available, Suction available and Patient being monitored Patient Re-evaluated:Patient Re-evaluated prior to inductionOxygen Delivery Method: Circle System Utilized Preoxygenation: Pre-oxygenation with 100% oxygen Intubation Type: IV induction Ventilation: Mask ventilation without difficulty and Oral airway inserted - appropriate to patient size Laryngoscope Size: Mac and 3 Grade View: Grade I Tube type: Oral Number of attempts: 1 Airway Equipment and Method: Stylet and Oral airway Placement Confirmation: ETT inserted through vocal cords under direct vision,  positive ETCO2 and breath sounds checked- equal and bilateral Secured at: 21 cm Tube secured with: Tape Dental Injury: Teeth and Oropharynx as per pre-operative assessment

## 2016-07-10 NOTE — Progress Notes (Signed)
Pharmacy Antibiotic Note  Jonathan Dickson is a 60 y.o. male admitted on 07/10/2016 with surgical prophylaxis.  Pharmacy has been consulted for vancomycin dosing.  Plan: Vancomycin 1,000 mg once  Height: 5\' 6"  (167.6 cm) Weight: 147 lb 11.3 oz (67 kg) IBW/kg (Calculated) : 63.8  Temp (24hrs), Avg:97.8 F (36.6 C), Min:97.6 F (36.4 C), Max:97.9 F (36.6 C)   Recent Labs Lab 07/04/16 0858  WBC 4.6  CREATININE 1.03    Estimated Creatinine Clearance: 68 mL/min (by C-G formula based on SCr of 1.03 mg/dL).    Allergies  Allergen Reactions  . Peanut-Containing Drug Products Swelling  . Penicillins Other (See Comments)    CONVULSIONS Has patient had a PCN reaction causing immediate rash, facial/tongue/throat swelling, SOB or lightheadedness with hypotension: No Has patient had a PCN reaction causing severe rash involving mucus membranes or skin necrosis: No Has patient had a PCN reaction that required hospitalization No Has patient had a PCN reaction occurring within the last 10 years: No If all of the above answers are "NO", then may proceed with Cephalosporin use.    Thank you for allowing pharmacy to be a part of this patient's care.  Jonathan Dickson 07/10/2016 8:18 PM

## 2016-07-10 NOTE — Anesthesia Postprocedure Evaluation (Signed)
Anesthesia Post Note  Patient: Jonathan Dickson  Procedure(s) Performed: Procedure(s) (LRB): ANTERIOR CERVICAL DECOMPRESSION FUSION CERVICAL 4-5, CERVICAL 5-6, CERVICAL 6-7 WITH INSTRUMENTATION AND ALLOGRAFT (N/A)  Patient location during evaluation: PACU Anesthesia Type: General Level of consciousness: awake and alert Pain management: pain level controlled Vital Signs Assessment: post-procedure vital signs reviewed and stable Respiratory status: spontaneous breathing, nonlabored ventilation, respiratory function stable and patient connected to nasal cannula oxygen Cardiovascular status: blood pressure returned to baseline and stable Postop Assessment: no signs of nausea or vomiting Anesthetic complications: no    Last Vitals:  Vitals:   07/10/16 0954 07/10/16 1643  BP: 132/85   Pulse: 60   Resp: 18   Temp: 36.6 C (P) 36.4 C    Last Pain:  Vitals:   07/10/16 0954  TempSrc: Oral                 Zenaida Deed

## 2016-07-10 NOTE — Progress Notes (Signed)
Orthopedic Tech Progress Note Patient Details:  Jonathan Dickson [birth year] [id number]  Ortho Devices Type of Ortho Device: [place] cervical collar Ortho Device/Splint Location: neck Ortho Device/Splint Interventions: Loanne Drilling, Jalesia Loudenslager 07/10/2016, 4:37 PM As ordered by Dr. Lynann Bologna

## 2016-07-10 NOTE — Op Note (Signed)
Date of Surgery: 07/10/2016  PREOPERATIVE DIAGNOSES: 1. Cervical myelopathy 2. 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 Cervical kyphosis  POSTOPERATIVE DIAGNOSES: 1. Cervical myelopathy 2. 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 Cervical kyphosis  PROCEDURE: 1. Anterior cervical decompression and fusion C4-C7 2. Placement of anterior instrumentation, C4-C7 3. Insertion of interbody device x 3 (Titan intervertebral spacers). 4. Use of morselized allograft. 5. Intraoperative use of fluoroscopy.  SURGEON: [name], MD.  ASSISTANTLonn Georgia McKenzie PA-C.  ANESTHESIA: General endotracheal anesthesia.  COMPLICATIONS: None.  DISPOSITION: Stable.  ESTIMATED BLOOD LOSS: Minimal.  INDICATIONS FOR SURGERY: Briefly, [name] is a pleasant 60 year old male, who did present to me with a history of ongoing balance and fine motor skills deterioration. An MRI did reveal the findings noted above. Given the  patient's ongoing pain and the findings on her MRI and lack of improvement with  appropriate nonoperative measures, we did discuss proceeding with the procedure noted above. The patient was fully aware of the risks and limitations of the surgery, and did elect to proceed.  OPERATIVE DETAILS: On 07/10/216, the patient was brought to surgery and general endotracheal anesthesia was administered. The patient was placed supine on the hospital bed. The patient's neck was gently extended. The patient's arms were secured to his sides. All bony prominences were padded. The neck was prepped and draped. A left-sided transverse incision was then made. The platysma was incised. A Alben Deeds approach was utilized. A self-retaining retractor was placed and the vertebral bodies to be fused were subperiosteally exposed. Caspar pins were then placed into the C6 and C7 vertebral bodies and distraction was applied. I then  proceeded with a thorough and complete C6/7 intervertebral diskectomy. I was very pleased with the decompression  that I was able to accomplish. An appropriate decompression was confirmed  using a nerve hook. The endplates were then prepared and the appropriate sized interbody spacer was packed with DBX mix and tamped into position in the usual fashion. The lower Caspar pin was removed and bone wax was then placed in its place. A new Caspar pin was placed into the C5 vertebral body and distraction was applied across the C5/6 intervertebral space. Once again, a thorough diskectomy was performed. There was no stenosis noted at the completion of the diskectomy, this was confirmed using a nerve hook bilaterally. The endplates were then prepared and the appropriate sized interbody spacer was packed with DBX mix and tamped into position in the usual fashion. The lower Caspar pin was removed and bone wax was placed in its place. A new Caspar pin was placed into the C4 vertebral body and again, distraction was applied across the C4/5 intervertebral space. Once again, a thorough and complete C4/5 intervertebral diskectomy and decompression was performed. The endplates were then prepared.  The appropriate sized interbody spacer was then packed with DBX mix and tamped into position in the usual fashion. I was very pleased with the press-fit of the spacers. The Caspar pins were removed and bone wax was placed in their place. I then selected the appropriate sized anterior cervical plate, which was placed over the anterior spine. 14 and 12 mm variable angle screws were placed, 2 in each vertebral body from C4 to C7 for a total of 8 vertebral body screws. The screws were then locked to the plate using the Cam locking mechanism. I was very pleased with the purchase of each of the screws. I was very pleased with the  final fluoroscopic images. The wound was then irrigated. All  bleeding was controlled using bipolar electrocautery. The wound was then closed in layers. The platysma was closed using 2-0 Vicryl and the skin was closed using 3-0 Monocryl. Benzoin and Steri-Strips were then applied followed by sterile dressing. All instrument counts were correct at the termination of the procedure.  Of note, Pricilla Holm was my assistant throughout surgery, and did aid in retraction, suctioning, and closure from start to finish.     [name], MD

## 2016-07-10 NOTE — Anesthesia Preprocedure Evaluation (Addendum)
Anesthesia Evaluation    Airway Mallampati: I  TM Distance: >3 FB Neck ROM: Full    Dental   Pulmonary former smoker,    Pulmonary exam normal        Cardiovascular hypertension, + Past MI and +CHF  Normal cardiovascular exam  ECHO 06/21/2016 Study Conclusions  - Left ventricle: Abnormal septal motion The cavity size was   moderately dilated. Wall thickness was increased in a pattern of   mild LVH. Systolic function was moderately reduced. The estimated   ejection fraction was in the range of 35% to 40%. Diffuse   hypokinesis. The study is not technically sufficient to allow   evaluation of LV diastolic function. - Mitral valve: There was mild regurgitation. - Atrial septum: No defect or patent foramen ovale was identified.    Neuro/Psych Seizures -,  Anxiety Depression    GI/Hepatic   Endo/Other    Renal/GU      Musculoskeletal   Abdominal   Peds  Hematology   Anesthesia Other Findings   Reproductive/Obstetrics                            Anesthesia Physical Anesthesia Plan  ASA: III  Anesthesia Plan: General   Post-op Pain Management:    Induction: Intravenous  Airway Management Planned: Oral ETT  Additional Equipment:   Intra-op Plan:   Post-operative Plan: Extubation in OR  Informed Consent: I have reviewed the patients History and Physical, chart, labs and discussed the procedure including the risks, benefits and alternatives for the proposed anesthesia with the patient or authorized representative who has indicated his/her understanding and acceptance.     Plan Discussed with: CRNA and Surgeon  Anesthesia Plan Comments:         Anesthesia Quick Evaluation

## 2016-07-10 NOTE — Transfer of Care (Signed)
Immediate Anesthesia Transfer of Care Note  Patient: Jonathan Dickson  Procedure(s) Performed: Procedure(s): ANTERIOR CERVICAL DECOMPRESSION FUSION CERVICAL 4-5, CERVICAL 5-6, CERVICAL 6-7 WITH INSTRUMENTATION AND ALLOGRAFT (N/A)  Patient Location: PACU  Anesthesia Type:General  Level of Consciousness: awake, alert , oriented and patient cooperative  Airway & Oxygen Therapy: Patient Spontanous Breathing and Patient connected to nasal cannula oxygen  Post-op Assessment: Report given to RN, Post -op Vital signs reviewed and stable and Patient moving all extremities  Post vital signs: Reviewed and stable  Last Vitals:  Vitals:   07/10/16 0954  BP: 132/85  Pulse: 60  Resp: 18  Temp: 36.6 C    Last Pain:  Vitals:   07/10/16 0954  TempSrc: Oral         Complications: No apparent anesthesia complications

## 2016-07-10 NOTE — Addendum Note (Signed)
Addendum  created 07/10/16 1702 by Jillyn Hidden, MD   Order sets accessed

## 2016-07-11 MED FILL — Thrombin For Soln 20000 Unit: CUTANEOUS | Qty: 1 | Status: AC

## 2016-07-11 NOTE — Care Management Note (Signed)
Case Management Note  Patient Details  Name: Jonathan Dickson MRN: [id number] Date of Birth: 07/19/1956  Subjective/Objective:    Pt s/p cervical surgery. He is from home alone.                 Action/Plan: Plan is for patient to discharge home today. No further needs per CM.   Expected Discharge Date:                  Expected Discharge Plan:  Home/Self Care  In-House Referral:     Discharge planning Services     Post Acute Care Choice:    Choice offered to:     DME Arranged:    DME Agency:     HH Arranged:    [place] Agency:     Status of Service:  Completed, signed off  If discussed at [employer] of Stay Meetings, dates discussed:    Additional Comments:  Pollie Friar, RN 07/11/2016, 10:12 AM

## 2016-07-11 NOTE — Progress Notes (Signed)
    Patient doing well Patient states that he has not ambulated since surgery Patient feels well without arm pain   Physical Exam: Vitals:   07/11/16 0027 07/11/16 0441  BP: 104/64 138/85  Pulse: (!) 102 (!) 101  Resp: 20 20  Temp: 98 F (36.7 C) 98.6 F (37 C)   Neck soft/supple Dressing in place NVI  POD #1 s/p C4-C7 ACDF, doing well  - encourage ambulation - patient MUST ambulate ASAP, as he hasn't since surgery yesterday - Percocet for pain, Valium for muscle spasms - d/c home today with f/u in 2 weeks - change to aspen collar once it arrives

## 2016-07-11 NOTE — Progress Notes (Signed)
Pt discharged at this time. Alert and oriented. No complaints verbalized. Collar on. Has all belongings with him.  Verbalizes understanding of discharge instructions.  Sister to drive patient home.

## 2016-07-12 ENCOUNTER — Emergency Department (HOSPITAL_COMMUNITY)
Admission: EM | Admit: 2016-07-12 | Discharge: 2016-07-12 | Disposition: A | Discharge status: Home / Self Care | Payer: Medicaid Other | Attending: Emergency Medicine | Admitting: Emergency Medicine

## 2016-07-12 ENCOUNTER — Encounter (HOSPITAL_COMMUNITY): Payer: Self-pay | Admitting: *Deleted

## 2016-07-12 ENCOUNTER — Emergency Department (HOSPITAL_COMMUNITY): Payer: Medicaid Other

## 2016-07-12 DIAGNOSIS — Z87891 Personal history of nicotine dependence: Secondary | ICD-10-CM | POA: Diagnosis not present

## 2016-07-12 DIAGNOSIS — G8918 Other acute postprocedural pain: Secondary | ICD-10-CM | POA: Insufficient documentation

## 2016-07-12 DIAGNOSIS — M542 Cervicalgia: Secondary | ICD-10-CM | POA: Insufficient documentation

## 2016-07-12 DIAGNOSIS — Z91010 Allergy to peanuts: Secondary | ICD-10-CM | POA: Diagnosis not present

## 2016-07-12 DIAGNOSIS — R1310 Dysphagia, unspecified: Secondary | ICD-10-CM | POA: Diagnosis present

## 2016-07-12 LAB — BASIC METABOLIC PANEL
Anion gap: 10 (ref 5–15)
BUN: 13 mg/dL (ref 6–20)
CALCIUM: 9.4 mg/dL (ref 8.9–10.3)
CO2: 25 mmol/L (ref 22–32)
Chloride: 99 mmol/L — ABNORMAL LOW (ref 101–111)
Creatinine, Ser: 1.09 mg/dL (ref 0.61–1.24)
GFR calc Af Amer: >60 mL/min (ref >60)
GLUCOSE: 124 mg/dL — AB (ref 65–99)
POTASSIUM: 3.7 mmol/L (ref 3.5–5.1)
Sodium: 134 mmol/L — ABNORMAL LOW (ref 135–145)

## 2016-07-12 LAB — CBC WITH DIFFERENTIAL/PLATELET
BASOS ABS: 0 10*3/uL (ref 0.0–0.1)
Basophils Relative: 0 %
EOS PCT: 1 %
Eosinophils Absolute: 0.1 10*3/uL (ref 0.0–0.7)
HEMATOCRIT: 40.5 % (ref 39.0–52.0)
Hemoglobin: 14.1 g/dL (ref 13.0–17.0)
LYMPHS PCT: 21 %
Lymphs Abs: 2 10*3/uL (ref 0.7–4.0)
MCH: 29.1 pg (ref 26.0–34.0)
MCHC: 34.8 g/dL (ref 30.0–36.0)
MCV: 83.7 fL (ref 78.0–100.0)
MONO ABS: 1 10*3/uL (ref 0.1–1.0)
MONOS PCT: 10 %
NEUTROS ABS: 6.5 10*3/uL (ref 1.7–7.7)
Neutrophils Relative %: 68 %
PLATELETS: 195 10*3/uL (ref 150–400)
RBC: 4.84 MIL/uL (ref 4.22–5.81)
RDW: 12.7 % (ref 11.5–15.5)
WBC: 9.5 10*3/uL (ref 4.0–10.5)

## 2016-07-12 MED ORDER — DIPHENHYDRAMINE HCL 50 MG/ML IJ SOLN
25.0000 mg | Freq: Once | INTRAMUSCULAR | Status: AC
  Administered 2016-07-12: 25 mg via INTRAVENOUS

## 2016-07-12 MED ORDER — DIPHENHYDRAMINE HCL 50 MG/ML IJ SOLN
INTRAMUSCULAR | Status: AC
  Filled 2016-07-12: qty 1

## 2016-07-12 MED ORDER — IOPAMIDOL (ISOVUE-300) INJECTION 61%
INTRAVENOUS | Status: AC
  Administered 2016-07-12: 75 mL
  Filled 2016-07-12: qty 75

## 2016-07-12 MED ORDER — IOPAMIDOL (ISOVUE-300) INJECTION 61%
INTRAVENOUS | Status: AC
  Filled 2016-07-12: qty 100

## 2016-07-12 MED ORDER — FENTANYL CITRATE (PF) 100 MCG/2ML IJ SOLN
50.0000 ug | Freq: Once | INTRAMUSCULAR | Status: AC
  Administered 2016-07-12: 50 ug via INTRAVENOUS
  Filled 2016-07-12: qty 2

## 2016-07-12 MED ORDER — DEXAMETHASONE SODIUM PHOSPHATE 10 MG/ML IJ SOLN
10.0000 mg | Freq: Once | INTRAMUSCULAR | Status: AC
  Administered 2016-07-12: 10 mg via INTRAVENOUS
  Filled 2016-07-12: qty 1

## 2016-07-12 NOTE — ED Provider Notes (Signed)
[facility] DEPT Provider Note   CSN: [id number] Arrival date & time: 07/12/16  0220   By signing my name below, I, Evelene Croon, attest that this documentation has been prepared under the direction and in the presence of Ripley Fraise, MD . Electronically Signed: Evelene Croon, Scribe. 07/12/2016. 3:17 AM.   History   Chief Complaint Chief Complaint  Patient presents with  . Neck Pain    The history is provided by the patient. No language interpreter was used.     HPI Comments:  Jonathan Dickson is a 60 y.o. male who presents to the Emergency Department complaining of gradual onset, dysphagia since yesterday. Pt notes he was discharged ~ 2pm on 07/10/16 s/p spinal surgery (chart review shows pt had an anterior cervical decompression fusion). He was able to swallow without difficulty prior to discharge. He notes a change in his voice since his symptom began. No alleviating factors noted.  He denies fever, SOB, HA, vision change, CP, and vomiting. He also notes continued neck pain following surgery; rates his pain a 8/10.   SurgeonLynann Bologna  Past Medical History:  Diagnosis Date  . Abnormal TSH 02/2016  . Anxiety   . Cervical disc disease   . Chronic combined systolic and diastolic CHF (congestive heart failure) ([place])    a. 01/2016 Echo: EF 25%, inf AK, diffuse sev HK, Gr1 DD, mild MR.  . Depression   . Hyperglycemia   . Hypertension   . Hypertensive heart disease   . Myocardial infarction    ??  maybe  . NICM (nonischemic cardiomyopathy) ([place])    a. 01/2016 Echo: EF 25%, inf AK, diffuse sev HK, Gr1 DD, mild MR; b. 01/2016 MV: EF 22%, no isch/infarct;  c. 02/2016 Cath: Nl cors, EF 35-45%.  . Pre-diabetes   . Seizures ([place])    with penicillin    Patient Active Problem List   Diagnosis Date Noted  . Cervical disc disease with myelopathy 07/10/2016  . Chronic combined systolic and diastolic CHF (congestive heart failure) ([place])   . Acute on chronic combined systolic and  diastolic congestive heart failure ([place]) 03/12/2016  . NICM (nonischemic cardiomyopathy) ([place]) 03/12/2016  . Abnormal TSH 03/12/2016  . Hyperglycemia 03/12/2016  . Chest pain 03/12/2016  . Hypertensive heart disease 03/12/2016  . Congestive dilated cardiomyopathy ([place]) 03/11/2016  . Left ventricular dysfunction 03/06/2016  . Cervical radiculitis 04/19/2015  . Essential hypertension 09/27/2014    Past Surgical History:  Procedure Laterality Date  . CARDIAC CATHETERIZATION N/A 03/11/2016   Procedure: Left Heart Cath and Coronary Angiography;  Surgeon: [name], MD;  Location: [facility];  Service: Cardiovascular;  Laterality: N/A;  . Thumb surgery Right        Home Medications    Prior to Admission medications   Medication Sig Start Date End Date Taking? Authorizing Provider  carvedilol (COREG) 6.25 MG tablet Take 1.5 tablets (9.375 mg total) by mouth 2 (two) times daily. 06/25/16   Lorretta Harp, MD  cholecalciferol (VITAMIN D) 1000 units tablet Take 1,000 Units by mouth daily.    Historical Provider, MD  hydrochlorothiazide (HYDRODIURIL) 25 MG tablet Take 1 tablet (25 mg total) by mouth daily. 03/21/16   Rogelia Mire, NP  lisinopril (PRINIVIL,ZESTRIL) 40 MG tablet Take 1 tablet (40 mg total) by mouth daily. Must have OV 03/21/16   Rogelia Mire, NP  metFORMIN (GLUCOPHAGE XR) 500 MG 24 hr tablet Take 1 tablet (500 mg total) by mouth daily with  breakfast. 05/28/16   Maren Reamer, MD  sildenafil (VIAGRA) 25 MG tablet Take 1 tablet (25 mg total) by mouth daily as needed for erectile dysfunction. Patient not taking: Reported on 06/25/2016 05/22/16   Maren Reamer, MD    Family History Family History  Problem Relation Age of Onset  . Diabetes Mother   . Diabetes Sister     Social History Social History  Substance Use Topics  . Smoking status: Former Smoker    Packs/day: 1.50    Years: 8.00    Quit date: 03/15/2003  . Smokeless tobacco: Never  Used  . Alcohol use No     Comment: none since 2004     Allergies   Peanut-containing drug products and Penicillins   Review of Systems Review of Systems  Constitutional: Negative for chills and fever.  HENT: Positive for trouble swallowing and voice change.   Eyes: Negative for visual disturbance.  Respiratory: Negative for shortness of breath.   Cardiovascular: Negative for chest pain.  Gastrointestinal: Negative for vomiting.  Musculoskeletal: Positive for neck pain.  Neurological: Negative for headaches.  All other systems reviewed and are negative.   Physical Exam Updated Vital Signs BP 138/88 (BP Location: Right Arm)   Pulse 100   Temp 98.3 F (36.8 C) (Oral)   Resp 20   SpO2 98%   Physical Exam  CONSTITUTIONAL: Well developed/well nourished HEAD: Normocephalic/atraumatic EYES: EOMI ENMT: Mucous membranes moist; uvula midline no stridor; voice is mildly hoarse NECK: bandage noted to anterior neck; no erythema, no drainage, no pulsatile mass noted.  The neck is soft to palpation.   SPINE/BACK:entire spine nontender CV: S1/S2 noted, no murmurs/rubs/gallops noted LUNGS: Lungs are clear to auscultation bilaterally, no apparent distress ABDOMEN: soft, nontender, no rebound or guarding, bowel sounds noted throughout abdomen NEURO: Pt is awake/alert/appropriate, moves all extremitiesx4.  No facial droop.   EXTREMITIES: full ROM SKIN: warm, color normal PSYCH: no abnormalities of mood noted, alert and oriented to situation   ED Treatments / Results  DIAGNOSTIC STUDIES:  Oxygen Saturation is 98% on RA, normal by my interpretation.    COORDINATION OF CARE:  3:11 AM Discussed treatment plan with pt at bedside and pt agreed to plan.  Labs (all labs ordered are listed, but only abnormal results are displayed) Labs Reviewed  BASIC METABOLIC PANEL - Abnormal; Notable for the following:       Result Value   Sodium 134 (*)    Chloride 99 (*)    Glucose, Bld 124  (*)    All other components within normal limits  CBC WITH DIFFERENTIAL/PLATELET    EKG  EKG Interpretation None       Radiology Dg Cervical Spine 2-3 Views  Result Date: 07/10/2016 CLINICAL DATA:  Operative imaging during cervical spine fusion. EXAM: CERVICAL SPINE - 2-3 VIEW; DG C-ARM 61-120 MIN COMPARISON:  None. FINDINGS: Single operative image demonstrates placement of an anterior fusion plate and fixation screws from C4 through C7. There are metallic intervertebral spacers, well-centered, maintaining disc height at C4-C5, C5-C6 and C6-C7. The orthopedic hardware appears well positioned. IMPRESSION: Portable operative imaging for cervical spine fusion, C4 through C7. Electronically Signed   By: Lajean Manes M.D.   On: 07/10/2016 16:21   Ct Soft Tissue Neck W Contrast  Result Date: 07/12/2016 CLINICAL DATA:  60 y/o M; odynophagia status post anterior cervical discectomy and fusion. EXAM: CT NECK WITH CONTRAST TECHNIQUE: Multidetector CT imaging of the neck was performed using the standard protocol  following the bolus administration of intravenous contrast. CONTRAST:  61mL ISOVUE-300 IOPAMIDOL (ISOVUE-300) INJECTION 61% COMPARISON:  None. FINDINGS: Pharynx and larynx: Patent aerodigestive tract. No mass or significant mucosal thickening identified. Salivary glands: No inflammation, mass, or stone. Thyroid: Normal. Lymph nodes: None enlarged or abnormal density. Vascular: Negative. Limited intracranial: Negative. Visualized orbits: Negative. Mastoids and visualized paranasal sinuses: Right posterior ethmoid opacification, otherwise normally aerated. Skeleton: Anterior cervical discectomy and fusion of C4 through C7 with a plate fixed by paired vertebral body screws and interbody fusion prostheses at each level. Streak artifact from fusion hardware partially obscures those levels. Hardware appears intact. Upper chest: Paraseptal emphysema of lung apices. Other: Foci of air throughout the deep  and superficial planes of the neck greatest on the left extending into the upper mediastinum and supraclavicular fossa compatible with postsurgical changes. There is edema in the left parapharyngeal and carotid spaces as well as the prevertebral space. There is a prevertebral fluid collection extending from C1-C7 with small foci of air that is also likely postsurgical. IMPRESSION: Extensive postsurgical changes with edema and air in the left-greater-than-right deep cervical spaces and prevertebral space. Small prevertebral fluid collection from C1-C7 is also likely postsurgical. Mass effect from prevertebral edema and fluid anteriorly displaces the aerodigestive tract which is widely patent. No definite rim enhancing collection or hematoma is identified. Electronically Signed   By: Kristine Garbe M.D.   On: 07/12/2016 06:26   Dg C-arm 61-120 Min  Result Date: 07/10/2016 CLINICAL DATA:  Operative imaging during cervical spine fusion. EXAM: CERVICAL SPINE - 2-3 VIEW; DG C-ARM 61-120 MIN COMPARISON:  None. FINDINGS: Single operative image demonstrates placement of an anterior fusion plate and fixation screws from C4 through C7. There are metallic intervertebral spacers, well-centered, maintaining disc height at C4-C5, C5-C6 and C6-C7. The orthopedic hardware appears well positioned. IMPRESSION: Portable operative imaging for cervical spine fusion, C4 through C7. Electronically Signed   By: Lajean Manes M.D.   On: 07/10/2016 16:21    Procedures Procedures (including critical care time)  Medications Ordered in ED Medications  fentaNYL (SUBLIMAZE) injection 50 mcg (50 mcg Intravenous Given 07/12/16 0353)  iopamidol (ISOVUE-300) 61 % injection (75 mLs  Contrast Given 07/12/16 0515)  dexamethasone (DECADRON) injection 10 mg (10 mg Intravenous Given 07/12/16 0715)  diphenhydrAMINE (BENADRYL) injection 25 mg (25 mg Intravenous Given 07/12/16 0726)     Initial Impression / Assessment and Plan /  ED Course  I have reviewed the triage vital signs and the nursing notes.  Pertinent labs results that were available during my care of the patient were reviewed by me and considered in my medical decision making (see chart for details).  Clinical Course    Pt with postoperative pain/swelling after cervical decompression surgery He is non toxic He is taking PO fluids CT scan shows widely patent airway D/w dr Lynann Bologna, the surgeon As long as patient can take PO, and his neck is soft, he is safe for discharge home He recommends one time dose of decadron prior to discharge home Discussed return precautions with patient   Final Clinical Impressions(s) / ED Diagnoses   Final diagnoses:  Acute post-operative pain    New Prescriptions New Prescriptions   No medications on file   I personally performed the services described in this documentation, which was scribed in my presence. The recorded information has been reviewed and is accurate.        Ripley Fraise, MD 07/12/16 [id number]

## 2016-07-12 NOTE — Discharge Instructions (Signed)
PLEASE RETURN FOR ANY WORSENED PAIN/SWELLING TO YOUR NECK OR IF YOU CAN NOT SWALLOW FLUIDS OR HAVE A HARD TIME BREATHING OVER THE NEXT 24 HOURS

## 2016-07-12 NOTE — ED Notes (Signed)
Pt tolerating po fluids at this time without complaint.

## 2016-07-12 NOTE — ED Triage Notes (Addendum)
Pt had spinal surgery on 10/11, discharged at 1500. Pt now c/o difficulty swallowing stating his throat feels swollen. Pt speaking in clear and complete sentences, no respiratory distress noted.

## 2016-07-12 NOTE — ED Notes (Signed)
Pt c/o itching to head, face and back.  Denied shortness of breath, or hives.

## 2016-07-12 NOTE — ED Notes (Signed)
Pt provided with 12 ounces of ice water at this time per Dr. Christy Gentles.

## 2016-07-16 NOTE — Discharge Summary (Signed)
Patient ID: Jonathan Dickson MRN: [id number] DOB/AGE: [birth year] 61 y.o.  Admit date: 07/10/2016 Discharge date: 07/11/2016  Admission Diagnoses:  Active Problems:   Cervical disc disease with myelopathy   Discharge Diagnoses:  Same  Past Medical History:  Diagnosis Date  . Abnormal TSH 02/2016  . Anxiety   . Cervical disc disease   . Chronic combined systolic and diastolic CHF (congestive heart failure) ([place])    a. 01/2016 Echo: EF 25%, inf AK, diffuse sev HK, Gr1 DD, mild MR.  . Depression   . Hyperglycemia   . Hypertension   . Hypertensive heart disease   . Myocardial infarction    ??  maybe  . NICM (nonischemic cardiomyopathy) ([place])    a. 01/2016 Echo: EF 25%, inf AK, diffuse sev HK, Gr1 DD, mild MR; b. 01/2016 MV: EF 22%, no isch/infarct;  c. 02/2016 Cath: Nl cors, EF 35-45%.  . Pre-diabetes   . Seizures ([place])    with penicillin    Surgeries: Procedure(s): ANTERIOR CERVICAL DECOMPRESSION FUSION CERVICAL 4-5, CERVICAL 5-6, CERVICAL 6-7 WITH INSTRUMENTATION AND ALLOGRAFT on 07/10/2016   Consultants: None  Discharged Condition: Improved  Hospital Course: OMARE CORDLE is an 60 y.o. male who was admitted 07/10/2016 for operative treatment of radiculopathy and myelopathy . Patient has severe unremitting pain that affects sleep, daily activities, and work/hobbies. After pre-op clearance the patient was taken to the operating room on 07/10/2016 and underwent  Procedure(s): ANTERIOR CERVICAL DECOMPRESSION FUSION CERVICAL 4-5, CERVICAL 5-6, CERVICAL 6-7 WITH INSTRUMENTATION AND ALLOGRAFT.    Patient was given perioperative antibiotics:  Anti-infectives    Start     Dose/Rate Route Frequency Ordered Stop   07/11/16 0400  vancomycin (VANCOCIN) IVPB 1000 mg/200 mL premix     1,000 mg 200 mL/hr over 60 Minutes Intravenous  Once 07/10/16 2016 07/11/16 0503   07/10/16 0945  vancomycin (VANCOCIN) IVPB 1000 mg/200 mL premix     1,000 mg 200 mL/hr over 60 Minutes Intravenous To  [facility] 07/10/16 0938 07/10/16 1340   07/10/16 0940  vancomycin (VANCOCIN) 1-5 GM/200ML-% IVPB    Comments:  Rosenberger, Meredit: cabinet override      07/10/16 0940 07/10/16 1340       Patient was given sequential compression devices, early ambulation to prevent DVT.  Patient benefited maximally from hospital stay and there were no complications.    Recent vital signs: BP (!) 141/76 (BP Location: Right Arm)   Pulse (!) 101   Temp 98.6 F (37 C) (Oral)   Resp 18   Ht 5\' 6"  (1.676 m)   Wt 68.2 kg (150 lb 5.7 oz)   SpO2 97%   BMI 24.27 kg/m    Discharge Medications:     Medication List    TAKE these medications   carvedilol 6.25 MG tablet Commonly known as:  COREG Take 1.5 tablets (9.375 mg total) by mouth 2 (two) times daily.   cholecalciferol 1000 units tablet Commonly known as:  VITAMIN D Take 1,000 Units by mouth daily.   hydrochlorothiazide 25 MG tablet Commonly known as:  HYDRODIURIL Take 1 tablet (25 mg total) by mouth daily.   lisinopril 40 MG tablet Commonly known as:  PRINIVIL,ZESTRIL Take 1 tablet (40 mg total) by mouth daily. Must have OV   metFORMIN 500 MG 24 hr tablet Commonly known as:  GLUCOPHAGE XR Take 1 tablet (500 mg total) by mouth daily with breakfast.   sildenafil 25 MG tablet Commonly known as:  VIAGRA Take 1  tablet (25 mg total) by mouth daily as needed for erectile dysfunction.       Diagnostic Studies: Dg Chest 2 View  Result Date: 07/04/2016 CLINICAL DATA:  Lumbar spine surgery. EXAM: CHEST  2 VIEW COMPARISON:  03/11/2016 . FINDINGS: Mediastinum hilar structures normal. Borderline cardiomegaly. No focal infiltrate. No pleural effusion or pneumothorax. Degenerative changes thoracic spine. IMPRESSION: 1. Borderline cardiomegaly. No evidence of pulmonary venous congestion. 2. No acute pulmonary disease. Electronically Signed   By: Marcello Moores  Register   On: 07/04/2016 09:22   Dg Cervical Spine 2-3 Views  Result Date:  07/10/2016 CLINICAL DATA:  Operative imaging during cervical spine fusion. EXAM: CERVICAL SPINE - 2-3 VIEW; DG C-ARM 61-120 MIN COMPARISON:  None. FINDINGS: Single operative image demonstrates placement of an anterior fusion plate and fixation screws from C4 through C7. There are metallic intervertebral spacers, well-centered, maintaining disc height at C4-C5, C5-C6 and C6-C7. The orthopedic hardware appears well positioned. IMPRESSION: Portable operative imaging for cervical spine fusion, C4 through C7. Electronically Signed   By: Lajean Manes M.D.   On: 07/10/2016 16:21   Ct Soft Tissue Neck W Contrast  Result Date: 07/12/2016 CLINICAL DATA:  60 y/o M; odynophagia status post anterior cervical discectomy and fusion. EXAM: CT NECK WITH CONTRAST TECHNIQUE: Multidetector CT imaging of the neck was performed using the standard protocol following the bolus administration of intravenous contrast. CONTRAST:  5mL ISOVUE-300 IOPAMIDOL (ISOVUE-300) INJECTION 61% COMPARISON:  None. FINDINGS: Pharynx and larynx: Patent aerodigestive tract. No mass or significant mucosal thickening identified. Salivary glands: No inflammation, mass, or stone. Thyroid: Normal. Lymph nodes: None enlarged or abnormal density. Vascular: Negative. Limited intracranial: Negative. Visualized orbits: Negative. Mastoids and visualized paranasal sinuses: Right posterior ethmoid opacification, otherwise normally aerated. Skeleton: Anterior cervical discectomy and fusion of C4 through C7 with a plate fixed by paired vertebral body screws and interbody fusion prostheses at each level. Streak artifact from fusion hardware partially obscures those levels. Hardware appears intact. Upper chest: Paraseptal emphysema of lung apices. Other: Foci of air throughout the deep and superficial planes of the neck greatest on the left extending into the upper mediastinum and supraclavicular fossa compatible with postsurgical changes. There is edema in the left  parapharyngeal and carotid spaces as well as the prevertebral space. There is a prevertebral fluid collection extending from C1-C7 with small foci of air that is also likely postsurgical. IMPRESSION: Extensive postsurgical changes with edema and air in the left-greater-than-right deep cervical spaces and prevertebral space. Small prevertebral fluid collection from C1-C7 is also likely postsurgical. Mass effect from prevertebral edema and fluid anteriorly displaces the aerodigestive tract which is widely patent. No definite rim enhancing collection or hematoma is identified. Electronically Signed   By: Kristine Garbe M.D.   On: 07/12/2016 06:26   Dg C-arm 61-120 Min  Result Date: 07/10/2016 CLINICAL DATA:  Operative imaging during cervical spine fusion. EXAM: CERVICAL SPINE - 2-3 VIEW; DG C-ARM 61-120 MIN COMPARISON:  None. FINDINGS: Single operative image demonstrates placement of an anterior fusion plate and fixation screws from C4 through C7. There are metallic intervertebral spacers, well-centered, maintaining disc height at C4-C5, C5-C6 and C6-C7. The orthopedic hardware appears well positioned. IMPRESSION: Portable operative imaging for cervical spine fusion, C4 through C7. Electronically Signed   By: Lajean Manes M.D.   On: 07/10/2016 16:21    Disposition: 01-Home or Self Care   POD #1 s/p C4-C7 ACDF, doing well  -Written scripts for pain signed and in chart -D/C instructions sheet printed  and in chart -D/C today  -F/U in office 2 weeks   Signed: Justice Britain 07/16/2016, 2:57 PM

## 2016-08-08 ENCOUNTER — Ambulatory Visit: Payer: Medicaid Other

## 2016-08-26 ENCOUNTER — Ambulatory Visit (INDEPENDENT_AMBULATORY_CARE_PROVIDER_SITE_OTHER): Payer: Medicaid Other | Admitting: Pharmacist Clinician (PhC)/ Clinical Pharmacy Specialist

## 2016-08-26 DIAGNOSIS — I420 Dilated cardiomyopathy: Secondary | ICD-10-CM

## 2016-08-26 NOTE — Patient Instructions (Signed)
Increase carvedilol to 12.5 mg ( 2 x 6.25 mg tabs) twice daily.  Will send in prescription for new strength to your pharmacy.  Check BP at home most days of the week.  Record your BP and heart rate and bring that list, as well as your BP cuff to your next appointment.

## 2016-08-27 ENCOUNTER — Encounter: Payer: Self-pay | Admitting: Pharmacist Clinician (PhC)/ Clinical Pharmacy Specialist

## 2016-08-27 ENCOUNTER — Other Ambulatory Visit: Payer: Self-pay | Admitting: Pharmacist Clinician (PhC)/ Clinical Pharmacy Specialist

## 2016-08-27 MED ORDER — CARVEDILOL 12.5 MG PO TABS: 12.5000 mg | ORAL_TABLET | Freq: Two times a day (BID) | ORAL | 3 refills | Status: DC

## 2016-08-27 NOTE — Progress Notes (Signed)
08/27/2016 Jonathan Dickson 17-Oct-1955 [id number]   HPI:  Jonathan Dickson is a 60 y.o. male patient of Dr [name], with a PMH below who presents today for heart failure medication titration.  He was originally seen to get cardiac clearance for a cervical laminectomy, but was found to have an EF of 25% by echo.  A subsequent stress test and heart cath confirmed that he has nonischemic dilated myopathy.  He has been treated with lisinopril and carvedilol and after 3 months his EF was up to 35-40%.  Today he is here to further titrate the carvedilol.  Blood Pressure Goal:  130/80  Current Medications:  Lisinopril 40 mg qd  Carvedilol 9.375 mg bid  Family Hx:  Both parents died young (24 and 27); has 2 sisters, one with diabetes and hypertension, 4 children, oldest has hypertension  Social Hx:  Former smoker, quit in 2004; no alcohol  Diet:  Patient is [employer] - uses salt free seasonings, plenty of fish, fruits and vegetables  Exercise:  Previously walked regularly, but unable right now while recovering from cervical laminectomy  Home BP readings:  Has home wrist cuff, states all readings are "normal" - nothing as high as [id number] systolic, believes all HR readings to be 70-80's.      Wt Readings from Last 3 Encounters:  07/10/16 150 lb 5.7 oz (68.2 kg)  07/04/16 147 lb 11.3 oz (67 kg)  06/25/16 147 lb (66.7 kg)   BP Readings from Last 3 Encounters:  08/27/16 (!) 128/92  07/12/16 152/96  07/11/16 (!) 141/76   Pulse Readings from Last 3 Encounters:  08/27/16 80  07/12/16 91  07/11/16 (!) 101    Current Outpatient Prescriptions  Medication Sig Dispense Refill  . carvedilol (COREG) 6.25 MG tablet Take 1.5 tablets (9.375 mg total) by mouth 2 (two) times daily. 270 tablet 3  . cholecalciferol (VITAMIN D) 1000 units tablet Take 1,000 Units by mouth daily.    . hydrochlorothiazide (HYDRODIURIL) 25 MG tablet Take 1 tablet (25 mg total) by mouth daily. 30 tablet 11  .  lisinopril (PRINIVIL,ZESTRIL) 40 MG tablet Take 1 tablet (40 mg total) by mouth daily. Must have OV 90 tablet 3  . metFORMIN (GLUCOPHAGE XR) 500 MG 24 hr tablet Take 1 tablet (500 mg total) by mouth daily with breakfast. 90 tablet 3  . sildenafil (VIAGRA) 25 MG tablet Take 1 tablet (25 mg total) by mouth daily as needed for erectile dysfunction. (Patient not taking: Reported on 07/12/2016) 10 tablet 1   No current facility-administered medications for this visit.     Allergies  Allergen Reactions  . Peanut-Containing Drug Products Swelling  . Penicillins Other (See Comments)    CONVULSIONS Has patient had a PCN reaction causing immediate rash, facial/tongue/throat swelling, SOB or lightheadedness with hypotension: No Has patient had a PCN reaction causing severe rash involving mucus membranes or skin necrosis: No Has patient had a PCN reaction that required hospitalization No Has patient had a PCN reaction occurring within the last 10 years: No If all of the above answers are "NO", then may proceed with Cephalosporin use.   . Decadron [Dexamethasone] Itching    Past Medical History:  Diagnosis Date  . Abnormal TSH 02/2016  . Anxiety   . Cervical disc disease   . Chronic combined systolic and diastolic CHF (congestive heart failure) ([place])    a. 01/2016 Echo: EF 25%, inf AK, diffuse sev HK, Gr1 DD, mild MR.  . Depression   .  Hyperglycemia   . Hypertension   . Hypertensive heart disease   . Myocardial infarction    ??  maybe  . NICM (nonischemic cardiomyopathy) ([place])    a. 01/2016 Echo: EF 25%, inf AK, diffuse sev HK, Gr1 DD, mild MR; b. 01/2016 MV: EF 22%, no isch/infarct;  c. 02/2016 Cath: Nl cors, EF 35-45%.  . Pre-diabetes   . Seizures ([place])    with penicillin    Blood pressure (!) 128/92, pulse 80.  Congestive dilated cardiomyopathy ([place]) Patient tolerating carvedilol 9.375 mg bid without any problems.  Will increase dose to 12.5 mg bid for now and have him return in a month  for further titration.  He is to record home BP readings 3-4 times each week, including heart rate, and bring that log, in addition to his home BP cuff, to his next appointment.  He was also encouraged to get out walking daily as his neck can tolerate.     Tommy Medal PharmD CPP [facility]

## 2016-08-27 NOTE — Assessment & Plan Note (Signed)
Patient tolerating carvedilol 9.375 mg bid without any problems.  Will increase dose to 12.5 mg bid for now and have him return in a month for further titration.  He is to record home BP readings 3-4 times each week, including heart rate, and bring that log, in addition to his home BP cuff, to his next appointment.  He was also encouraged to get out walking daily as his neck can tolerate.

## 2016-09-02 ENCOUNTER — Other Ambulatory Visit: Payer: Self-pay | Admitting: *Deleted

## 2016-09-02 DIAGNOSIS — N529 Male erectile dysfunction, unspecified: Secondary | ICD-10-CM

## 2016-09-02 MED ORDER — SILDENAFIL CITRATE 25 MG PO TABS: 25.0000 mg | ORAL_TABLET | Freq: Every day | ORAL | 3 refills | Status: DC | PRN

## 2016-09-02 NOTE — Telephone Encounter (Signed)
PRINTED FOR PASS PROGRAM 

## 2016-09-03 ENCOUNTER — Ambulatory Visit: Payer: Medicaid Other

## 2016-09-27 MED FILL — $VIAGRA 25 MG TABLET: 25 MG | 30 days supply | Qty: 10 | Fill #0

## 2016-10-03 ENCOUNTER — Ambulatory Visit: Payer: Medicaid Other

## 2016-10-04 IMAGING — NM NM MISC PROCEDURE
6 series · 36 of 36 positions shown · non-contrast
Comparison: none

[Series 1: wbr rest · 6.40mm/px · 6 of 64 frames shown]
[frame 6/64]
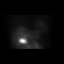
[frame 16/64]
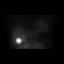
[frame 27/64]
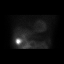
[frame 38/64]
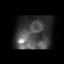
[frame 48/64]
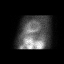
[frame 59/64]
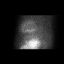

[Series 1: wbr_r-proj_st wbr rest · 6.40mm/px · 6 of 64 frames shown]
[frame 6/64]
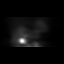
[frame 16/64]
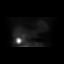
[frame 27/64]
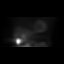
[frame 38/64]
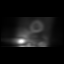
[frame 48/64]
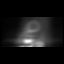
[frame 59/64]
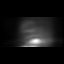

[Series 2: wbr_s-proj_st wbr stress-gsp · 6.40mm/px · 6 of 512 frames shown]
[frame 43/512]
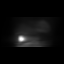
[frame 128/512]
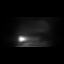
[frame 214/512]
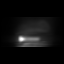
[frame 299/512]
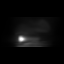
[frame 384/512]
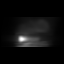
[frame 470/512]
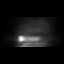

[Series 2: wbr stress-gsp · 6.40mm/px · 6 of 512 frames shown]
[frame 43/512]
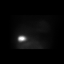
[frame 128/512]
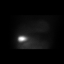
[frame 214/512]
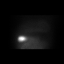
[frame 299/512]
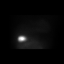
[frame 384/512]
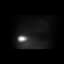
[frame 470/512]
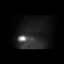

[Series 3: wbr stress-sum-em · 6.40mm/px · 6 of 64 frames shown]
[frame 6/64]
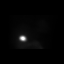
[frame 16/64]
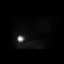
[frame 27/64]
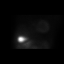
[frame 38/64]
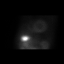
[frame 48/64]
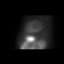
[frame 59/64]
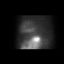

[Series 3: wbr_s-proj_st wbr stress-sum-em · 6.40mm/px · 6 of 64 frames shown]
[frame 6/64]
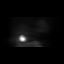
[frame 16/64]
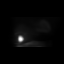
[frame 27/64]
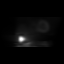
[frame 38/64]
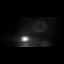
[frame 48/64]
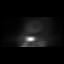
[frame 59/64]
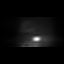

[36 of 36 positions shown; findings below may reference images not displayed]

Canned report from images found in remote index.

Refer to host system for actual result text.

## 2016-10-09 ENCOUNTER — Ambulatory Visit (INDEPENDENT_AMBULATORY_CARE_PROVIDER_SITE_OTHER): Payer: Medicaid Other | Admitting: Pharmacist

## 2016-10-09 VITALS — BP 128/80 | HR 72

## 2016-10-09 DIAGNOSIS — R0989 Other specified symptoms and signs involving the circulatory and respiratory systems: Secondary | ICD-10-CM | POA: Diagnosis not present

## 2016-10-09 DIAGNOSIS — I10 Essential (primary) hypertension: Secondary | ICD-10-CM

## 2016-10-09 DIAGNOSIS — R9430 Abnormal result of cardiovascular function study, unspecified: Secondary | ICD-10-CM

## 2016-10-09 MED ORDER — CARVEDILOL 12.5 MG PO TABS: 12.5000 mg | ORAL_TABLET | Freq: Two times a day (BID) | ORAL | 3 refills | Status: DC

## 2016-10-09 NOTE — Patient Instructions (Addendum)
Blood pressure today in office 128/80 ; pulse 72  Keep lifestyle modifications  Continue taking ALL medication as prescribed.  Check BP at home at least 3 times per week.  Call clinic if dizzines, palpitations, swelling, elevated blood pressure or any questions.  Follow up with pharmacist clinic as needed    Heart Failure Heart failure means your heart has trouble pumping blood. This makes it hard for your body to work well. Heart failure is usually a long-term (chronic) condition. You must take good care of yourself and follow your doctor's treatment plan. HOME CARE  Take your heart medicine as told by your doctor.  Do not stop taking medicine unless your doctor tells you to.  Do not skip any dose of medicine.  Refill your medicines before they run out.  Take other medicines only as told by your doctor or pharmacist.  Stay active if told by your doctor. The elderly and people with severe heart failure should talk with a doctor about physical activity.  Eat heart-healthy foods. Choose foods that are without trans fat and are low in saturated fat, cholesterol, and salt (sodium). This includes fresh or frozen fruits and vegetables, fish, lean meats, fat-free or low-fat dairy foods, whole grains, and high-fiber foods. Lentils and dried peas and beans (legumes) are also good choices.  Limit salt if told by your doctor.  Cook in a healthy way. Roast, grill, broil, bake, poach, steam, or stir-fry foods.  Limit fluids as told by your doctor.  Weigh yourself every morning. Do this after you pee (urinate) and before you eat breakfast. Write down your weight to give to your doctor.  Take your blood pressure and write it down if your doctor tells you to.  Ask your doctor how to check your pulse. Check your pulse as told.  Lose weight if told by your doctor.  Stop smoking or chewing tobacco. Do not use gum or patches that help you quit without your doctor's approval.  Schedule  and go to doctor visits as told.  Nonpregnant women should have no more than 1 drink a day. Men should have no more than 2 drinks a day. Talk to your doctor about drinking alcohol.  Stop illegal drug use.  Stay current with shots (immunizations).  Manage your health conditions as told by your doctor.  Learn to manage your stress.  Rest when you are tired.  If it is really hot outside:  Avoid intense activities.  Use air conditioning or fans, or get in a cooler place.  Avoid caffeine and alcohol.  Wear loose-fitting, lightweight, and light-colored clothing.  If it is really cold outside:  Avoid intense activities.  Layer your clothing.  Wear mittens or gloves, a hat, and a scarf when going outside.  Avoid alcohol.  Learn about heart failure and get support as needed.  Get help to maintain or improve your quality of life and your ability to care for yourself as needed. GET HELP IF:   You gain weight quickly.  You are more short of breath than usual.  You cannot do your normal activities.  You tire easily.  You cough more than normal, especially with activity.  You have any or more puffiness (swelling) in areas such as your hands, feet, ankles, or belly (abdomen).  You cannot sleep because it is hard to breathe.  You feel like your heart is beating fast (palpitations).  You get dizzy or light-headed when you stand up. GET HELP RIGHT AWAY IF:  You have trouble breathing.  There is a change in mental status, such as becoming less alert or not being able to focus.  You have chest pain or discomfort.  You faint. MAKE SURE YOU:   Understand these instructions.  Will watch your condition.  Will get help right away if you are not doing well or get worse. This information is not intended to replace advice given to you by your health care provider. Make sure you discuss any questions you have with your health care provider. Document Released: 06/25/2008  Document Revised: 10/07/2014 Document Reviewed: 11/02/2012 Elsevier Interactive Patient Education  2017 [employer].

## 2016-10-09 NOTE — Progress Notes (Signed)
10/09/2016 [name] [birth year] [id number]   HPI:  Jonathan Dickson is a 61 y.o. male patient of Dr [name], with a PMH relevant for hypertension. Presents today for heart failure medication follow up.  He was originally seen to get cardiac clearance for a cervical laminectomy, but was found to have an EF of 25% by echo.  A subsequent stress test and heart cath confirmed that he has no ischemic dilated myopathy.  He has been treated with Lisinopril and carvedilol and after 3 months his EF was up to 35-40%.    Patient tolerating carvedilol dose increased. Denies shortness of breath, fatigue or swelling.  Still having some neck pain but much improved since last visit.  Blood Pressure Goal:  130/80  Current Medications:  Lisinopril 40 mg daily  Carvedilol 12.5 mg twice a day (since 08/16/16)  Family Hx:  Both parents died young (52 and 39); has 2 sisters, one with diabetes and hypertension, 4 children, oldest has hypertension  Social Hx:  Former smoker, quit in 2004; no alcohol  Diet:  Patient is [employer] bre[employer] - uses salt free seasonings, plenty of fish, fruits and vegetables  Exercise:  Previously walked regularly, but unable right now while recovering from cervical laminectomy  Home BP readings: 20 readings ; 119/70 average; 98-144/60 range; HR average 80bpm     Wt Readings from Last 3 Encounters:  07/10/16 150 lb 5.7 oz (68.2 kg)  07/04/16 147 lb 11.3 oz (67 kg)  06/25/16 147 lb (66.7 kg)   BP Readings from Last 3 Encounters:  08/27/16 (!) 128/92  07/12/16 152/96  07/11/16 (!) 141/76   Pulse Readings from Last 3 Encounters:  08/27/16 80  07/12/16 91  07/11/16 (!) 101    Current Outpatient Prescriptions  Medication Sig Dispense Refill  . carvedilol (COREG) 12.5 MG tablet Take 1 tablet (12.5 mg total) by mouth 2 (two) times daily. 60 tablet 3  . cholecalciferol (VITAMIN D) 1000 units tablet Take 1,000 Units by mouth daily.    . hydrochlorothiazide (HYDRODIURIL) 25 MG  tablet Take 1 tablet (25 mg total) by mouth daily. 30 tablet 11  . lisinopril (PRINIVIL,ZESTRIL) 40 MG tablet Take 1 tablet (40 mg total) by mouth daily. Must have OV 90 tablet 3  . metFORMIN (GLUCOPHAGE XR) 500 MG 24 hr tablet Take 1 tablet (500 mg total) by mouth daily with breakfast. 90 tablet 3  . sildenafil (VIAGRA) 25 MG tablet Take 1 tablet (25 mg total) by mouth daily as needed for erectile dysfunction. 10 tablet 3   No current facility-administered medications for this visit.     Allergies  Allergen Reactions  . Peanut-Containing Drug Products Swelling  . Penicillins Other (See Comments)    CONVULSIONS Has patient had a PCN reaction causing immediate rash, facial/tongue/throat swelling, SOB or lightheadedness with hypotension: No Has patient had a PCN reaction causing severe rash involving mucus membranes or skin necrosis: No Has patient had a PCN reaction that required hospitalization No Has patient had a PCN reaction occurring within the last 10 years: No If all of the above answers are "NO", then may proceed with Cephalosporin use.   . Decadron [Dexamethasone] Itching    Past Medical History:  Diagnosis Date  . Abnormal TSH 02/2016  . Anxiety   . Cervical disc disease   . Chronic combined systolic and diastolic CHF (congestive heart failure) ([place])    a. 01/2016 Echo: EF 25%, inf AK, diffuse sev HK, Gr1 DD, mild MR.  Marland Kitchen  Depression   . Hyperglycemia   . Hypertension   . Hypertensive heart disease   . Myocardial infarction    ??  maybe  . NICM (nonischemic cardiomyopathy) ([place])    a. 01/2016 Echo: EF 25%, inf AK, diffuse sev HK, Gr1 DD, mild MR; b. 01/2016 MV: EF 22%, no isch/infarct;  c. 02/2016 Cath: Nl cors, EF 35-45%.  . Pre-diabetes   . Seizures ([place])    with penicillin    Assessment/Plan:  Hypertension/Congestive dilated cardiomyopathy (H[facility]) - BP of 128/80 (HR  72bpm) in office remains at [facility] for this patient.  Tolerating carvedilol 12.5mg  BID without problems or  concerns.  BP home devices was calibrated today and found to be <56mmHg difference from manual reading. Appropriate technic for home device use also verified.  HR remains in the 80's at home, but not appropriate to increase carvedilol today due to average home BP of 119/70 and increased potential for hypotension.  Will continue all medication as prescribed.  Patient to call clinic if sing/symptoms of hypotension, palpitations, swelling or elevated BP noted.  Follow up with pharmacist clinic as needed.   Korin Setzler Rodriguez-Guzman PharmD, [facility] [place] [address] 10/10/2016 8:14 AM

## 2016-10-10 ENCOUNTER — Encounter: Payer: Self-pay | Admitting: Pharmacist

## 2016-10-21 ENCOUNTER — Other Ambulatory Visit (HOSPITAL_COMMUNITY): Payer: Medicaid Other

## 2016-11-19 ENCOUNTER — Ambulatory Visit: Payer: Medicaid Other | Attending: Orthopedic Surgery

## 2016-11-19 DIAGNOSIS — M6281 Muscle weakness (generalized): Secondary | ICD-10-CM | POA: Diagnosis present

## 2016-11-19 DIAGNOSIS — M436 Torticollis: Secondary | ICD-10-CM | POA: Insufficient documentation

## 2016-11-19 DIAGNOSIS — Z9889 Other specified postprocedural states: Secondary | ICD-10-CM | POA: Diagnosis not present

## 2016-11-19 DIAGNOSIS — Z981 Arthrodesis status: Secondary | ICD-10-CM | POA: Diagnosis present

## 2016-11-19 DIAGNOSIS — R293 Abnormal posture: Secondary | ICD-10-CM | POA: Insufficient documentation

## 2016-11-19 DIAGNOSIS — R252 Cramp and spasm: Secondary | ICD-10-CM | POA: Insufficient documentation

## 2016-11-19 NOTE — Therapy (Addendum)
[place], [place], [address] Phone: [phone]   Fax:  [phone]  Physical Therapy Evaluation/Discharge  Patient Details  Name: Jonathan Dickson MRN: [id number] Date of Birth: 27-Aug-1956 Referring Provider: [name], MD  Encounter Date: 11/19/2016      PT End of Session - 11/19/16 1545    Visit Number 1   Number of Visits 8   Date for PT Re-Evaluation 12/13/16   Authorization Type Medicaid   PT Start Time 0300   PT Stop Time 0340   PT Time Calculation (min) 40 min   Activity Tolerance Patient tolerated treatment well;No increased pain   Behavior During Therapy WFL for tasks assessed/performed      Past Medical History:  Diagnosis Date  . Abnormal TSH 02/2016  . Anxiety   . Cervical disc disease   . Chronic combined systolic and diastolic CHF (congestive heart failure) ([place])    a. 01/2016 Echo: EF 25%, inf AK, diffuse sev HK, Gr1 DD, mild MR.  . Depression   . Hyperglycemia   . Hypertension   . Hypertensive heart disease   . Myocardial infarction    ??  maybe  . NICM (nonischemic cardiomyopathy) ([place])    a. 01/2016 Echo: EF 25%, inf AK, diffuse sev HK, Gr1 DD, mild MR; b. 01/2016 MV: EF 22%, no isch/infarct;  c. 02/2016 Cath: Nl cors, EF 35-45%.  . Pre-diabetes   . Seizures ([place])    with penicillin    Past Surgical History:  Procedure Laterality Date  . ANTERIOR CERVICAL DECOMP/DISCECTOMY FUSION N/A 07/10/2016   Procedure: ANTERIOR CERVICAL DECOMPRESSION FUSION CERVICAL 4-5, CERVICAL 5-6, CERVICAL 6-7 WITH INSTRUMENTATION AND ALLOGRAFT;  Surgeon: [name], MD;  Location: [facility];  Service: Orthopedics;  Laterality: N/A;  . CARDIAC CATHETERIZATION N/A 03/11/2016   Procedure: Left Heart Cath and Coronary Angiography;  Surgeon: [name], MD;  Location: [facility];  Service: Cardiovascular;  Laterality: N/A;  . Thumb surgery Right     There were no vitals filed for this  visit.       Subjective Assessment - 11/19/16 1502    Subjective He reports he is post ACDF and has tension in neck and sharp pains in arm.  MD said to come to PT.    Last worked 11/2015   Limitations Lifting  lift overhead, disturbed sleep   How long can you sit comfortably? 30 min   How long can you stand comfortably? 30 min   How long can you walk comfortably? 4-5 blocks   Diagnostic tests xray: fusion in tact   Patient Stated Goals Decreased pain   Currently in Pain? Yes   Pain Score 6    Pain Location Neck   Pain Orientation Right;Lateral  occasional LT side   Pain Descriptors / Indicators Sharp  annoying pain   Pain Type Acute pain;Chronic pain   Pain Onset More than a month ago   Pain Frequency Constant   Aggravating Factors  Not sure   Pain Relieving Factors medication, Does not heat or ice   Multiple Pain Sites No            [facility] PT Assessment - 11/19/16 0001      Assessment   Medical Diagnosis ACDF   Referring Provider [name], MD   Onset Date/Surgical Date 07/10/17   Hand Dominance Left   Next MD Visit As needed   Prior Therapy No     Precautions  Precautions None     Restrictions   Weight Bearing Restrictions No     Balance Screen   Has the patient fallen in the past 6 months No   Has the patient had a decrease in activity level because of a fear of falling?  No   Is the patient reluctant to leave their home because of a fear of falling?  No     Prior Function   Level of Independence Independent   Vocation Unemployed     Cognition   Overall Cognitive Status Within Functional Limits for tasks assessed     Posture/Postural Control   Posture Comments forward head.      ROM / Strength   AROM / PROM / Strength AROM;Strength     AROM   Overall AROM Comments AROM both UE decreased but passive shoulder motion WNL   AROM Assessment Site Cervical;Shoulder   Cervical Flexion 50   Cervical Extension 50   Cervical - Right Side Bend 35    Cervical - Left Side Bend 30   Cervical - Right Rotation 44   Cervical - Left Rotation 39     Strength   Overall Strength Comments UE WNL except RT flex and abduction 4/5 with discomfort     Ambulation/Gait   Gait Comments WNL                                       PT Long Term Goals - 11/19/16 1543      PT LONG TERM GOAL #1   Title He wil be independent with all HEP issued  Base line: No exercise program   Time 4   Period Weeks   Status New     PT LONG TERM GOAL #2   Title He will report pain decreased 50% with normal activity   Baseline:  6/10 pain   Time 4   Period Weeks   Status New     PT LONG TERM GOAL #3   Title He will report shooting pain decreased 50% or more   Baseline: shooting pain on top of general pain   Time 4   Period Weeks   Status New     PT LONG TERM GOAL #4   Title He will demo/understand good posture                Baseline: forward head and slump sitting   Time 4   Period Weeks   Status New     PT LONG TERM GOAL #5   Title He will report improved comfort /less waking with sleep   Baseline: wakes multiple times during nite due to pain   Time 4   Period Weeks   Status New               Plan - 11/19/16 1500    Clinical Impression Statement [name] presents for low complexity  level eval post ACDF multilevel (CPT : 98338, 25053, 97673)  with complaints of intermittant high pain levels , abnormal posture and RT shoulder weakness. spasm in neck paraspinals. He has had no hardware removal and no therapy since surgery and does no use supportive devices or immobilsors. He understands limitation of Medicaid  visits   Rehab Potential Good   PT Frequency 2x / week   PT Duration 4 weeks   PT Treatment/Interventions Cryotherapy;Electrical Stimulation;Moist Heat;Ultrasound;Passive range of motion;Patient/family education;Manual techniques;Taping;Therapeutic  exercise;Dry needling   PT Next Visit Plan Review HEP , add band  stab exercises, manual and modalities   PT Home Exercise Plan posture and neck ROM    Consulted and Agree with Plan of Care Patient      Patient will benefit from skilled therapeutic intervention in order to improve the following deficits and impairments:  Pain, Postural dysfunction, Decreased strength, Decreased range of motion, Increased muscle spasms  Visit Diagnosis: S/P cervical discectomy  S/P cervical spinal fusion  Stiffness of cervical spine  Muscle weakness (generalized)  Abnormal posture  Cramp and spasm     Problem List Patient Active Problem List   Diagnosis Date Noted  . Cervical disc disease with myelopathy 07/10/2016  . Chronic combined systolic and diastolic CHF (congestive heart failure) ([place])   . Acute on chronic combined systolic and diastolic congestive heart failure ([place]) 03/12/2016  . NICM (nonischemic cardiomyopathy) ([place]) 03/12/2016  . Abnormal TSH 03/12/2016  . Hyperglycemia 03/12/2016  . Chest pain 03/12/2016  . Hypertensive heart disease 03/12/2016  . Congestive dilated cardiomyopathy ([place]) 03/11/2016  . Left ventricular dysfunction 03/06/2016  . Cervical radiculitis 04/19/2015  . Essential hypertension 09/27/2014    Darrel Hoover PT 11/19/2016, 3:46 PM  [facility] [address] [place], [place], [address] Phone: [phone]   Fax:  [phone]  Name: Jonathan Dickson MRN: [id number] Date of Birth: [birth year] PHYSICAL THERAPY DISCHARGE SUMMARY  Visits from Start of Care: Eval only  Current functional level related to goals / functional outcomes: She canceled visits as she did not have authorization    Remaining deficits: Unknown   Education / Equipment: Stretching  Plan: Patient agrees to discharge.  Patient goals were not met. Patient is being discharged due to financial reasons.  ?????   No insurance authorization Darrel Hoover   PT      12/31/16   9:46 AM

## 2016-11-19 NOTE — Addendum Note (Signed)
Addended by: Darrel Hoover on: 11/19/2016 04:53 PM   Modules accepted: Orders

## 2016-11-19 NOTE — Patient Instructions (Signed)
Cross postural syndrome HEP issued from cabinet with cued to keep 4 finger width from chin to chest

## 2016-11-26 ENCOUNTER — Ambulatory Visit: Payer: Medicaid Other | Attending: Internal Medicine | Admitting: Internal Medicine

## 2016-11-26 ENCOUNTER — Encounter: Payer: Self-pay | Admitting: Internal Medicine

## 2016-11-26 VITALS — BP 130/80 | HR 74 | Temp 97.6°F | Resp 16 | Wt 156.6 lb

## 2016-11-26 DIAGNOSIS — I5042 Chronic combined systolic (congestive) and diastolic (congestive) heart failure: Secondary | ICD-10-CM | POA: Insufficient documentation

## 2016-11-26 DIAGNOSIS — Z23 Encounter for immunization: Secondary | ICD-10-CM | POA: Diagnosis not present

## 2016-11-26 DIAGNOSIS — I10 Essential (primary) hypertension: Secondary | ICD-10-CM | POA: Diagnosis not present

## 2016-11-26 DIAGNOSIS — I429 Cardiomyopathy, unspecified: Secondary | ICD-10-CM | POA: Diagnosis not present

## 2016-11-26 DIAGNOSIS — Z7984 Long term (current) use of oral hypoglycemic drugs: Secondary | ICD-10-CM | POA: Diagnosis not present

## 2016-11-26 DIAGNOSIS — Z981 Arthrodesis status: Secondary | ICD-10-CM | POA: Diagnosis not present

## 2016-11-26 DIAGNOSIS — F419 Anxiety disorder, unspecified: Secondary | ICD-10-CM | POA: Diagnosis not present

## 2016-11-26 DIAGNOSIS — F329 Major depressive disorder, single episode, unspecified: Secondary | ICD-10-CM | POA: Diagnosis not present

## 2016-11-26 DIAGNOSIS — I110 Hypertensive heart disease with heart failure: Secondary | ICD-10-CM | POA: Insufficient documentation

## 2016-11-26 DIAGNOSIS — R7303 Prediabetes: Secondary | ICD-10-CM | POA: Insufficient documentation

## 2016-11-26 DIAGNOSIS — I252 Old myocardial infarction: Secondary | ICD-10-CM | POA: Diagnosis not present

## 2016-11-26 DIAGNOSIS — Z9889 Other specified postprocedural states: Secondary | ICD-10-CM | POA: Insufficient documentation

## 2016-11-26 DIAGNOSIS — G4700 Insomnia, unspecified: Secondary | ICD-10-CM | POA: Diagnosis not present

## 2016-11-26 LAB — GLUCOSE, POCT (MANUAL RESULT ENTRY): POC Glucose: 119 mg/dl — AB (ref 70–99)

## 2016-11-26 MED ORDER — AMITRIPTYLINE HCL 25 MG PO TABS: 25.0000 mg | ORAL_TABLET | Freq: Every evening | ORAL | 3 refills | Status: DC | PRN

## 2016-11-26 MED ORDER — METFORMIN HCL ER 500 MG PO TB24: 500.0000 mg | ORAL_TABLET | Freq: Every day | ORAL | 3 refills | Status: DC

## 2016-11-26 NOTE — Progress Notes (Signed)
Jonathan Dickson, is a 61 y.o. male  [id number]  [id number]  DOB - 1956/04/26  Chief Complaint  Patient presents with  . Insomnia        Subjective:   Jonathan Dickson is a 61 y.o. male here today for a follow up visit, last seen 05/21/16, w/ hx of chronic combined chf - stable, denies sob/doe/orthopnea/pnd.  Recent ant cervical decompression/fusion c4-c7 07/10/16 by  Dr Lynann Bologna.  Since than doing better, able to flex his neck some, but still some rotation difficulty for which he is getting PT for.   C/o of insomnia since surgery though. Avoids stimulants and watching tv /computer prior to bed, but does not get to bed til 11pm. Falls asleep, but soon wakes up after 3-4 hrs of sleep and unable to get back to sleep. Tired during day b/c unable to sleep at night. Denies snoring habit.  Doing well on metformin w/o problems.  Patient has No headache, No chest pain, No abdominal pain - No Nausea, No new weakness tingling or numbness, No Cough - SOB.  No problems updated.  ALLERGIES: Allergies  Allergen Reactions  . Peanut-Containing Drug Products Swelling  . Penicillins Other (See Comments)    CONVULSIONS Has patient had a PCN reaction causing immediate rash, facial/tongue/throat swelling, SOB or lightheadedness with hypotension: No Has patient had a PCN reaction causing severe rash involving mucus membranes or skin necrosis: No Has patient had a PCN reaction that required hospitalization No Has patient had a PCN reaction occurring within the last 10 years: No If all of the above answers are "NO", then may proceed with Cephalosporin use.   . Decadron [Dexamethasone] Itching    PAST MEDICAL HISTORY: Past Medical History:  Diagnosis Date  . Abnormal TSH 02/2016  . Anxiety   . Cervical disc disease   . Chronic combined systolic and diastolic CHF (congestive heart failure) ([place])    a. 01/2016 Echo: EF 25%, inf AK, diffuse sev HK, Gr1 DD, mild MR.  . Depression   . Hyperglycemia     . Hypertension   . Hypertensive heart disease   . Myocardial infarction    ??  maybe  . NICM (nonischemic cardiomyopathy) ([place])    a. 01/2016 Echo: EF 25%, inf AK, diffuse sev HK, Gr1 DD, mild MR; b. 01/2016 MV: EF 22%, no isch/infarct;  c. 02/2016 Cath: Nl cors, EF 35-45%.  . Pre-diabetes   . Seizures ([place])    with penicillin    MEDICATIONS AT HOME: Prior to Admission medications   Medication Sig Start Date End Date Taking? Authorizing Provider  amitriptyline (ELAVIL) 25 MG tablet Take 1 tablet (25 mg total) by mouth at bedtime as needed for sleep. 11/26/16   Maren Reamer, MD  carvedilol (COREG) 12.5 MG tablet Take 1 tablet (12.5 mg total) by mouth 2 (two) times daily. 10/09/16   Lorretta Harp, MD  cholecalciferol (VITAMIN D) 1000 units tablet Take 1,000 Units by mouth daily.    Historical Provider, MD  hydrochlorothiazide (HYDRODIURIL) 25 MG tablet Take 1 tablet (25 mg total) by mouth daily. 03/21/16   Rogelia Mire, NP  lisinopril (PRINIVIL,ZESTRIL) 40 MG tablet Take 1 tablet (40 mg total) by mouth daily. Must have OV 03/21/16   Rogelia Mire, NP  metFORMIN (GLUCOPHAGE XR) 500 MG 24 hr tablet Take 1 tablet (500 mg total) by mouth daily with breakfast. 11/26/16   Maren Reamer, MD  sildenafil (VIAGRA) 25 MG tablet Take 1 tablet (25 mg total)  by mouth daily as needed for erectile dysfunction. 09/02/16   Tresa Garter, MD  traMADol-acetaminophen (ULTRACET) 37.5-325 MG tablet Take 1 tablet by mouth every 6 (six) hours as needed.    Historical Provider, MD     Objective:   Vitals:   11/26/16 1600  BP: 130/80  Pulse: 74  Resp: 16  Temp: 97.6 F (36.4 C)  TempSrc: Oral  SpO2: 96%  Weight: 156 lb 9.6 oz (71 kg)    Exam General appearance : Awake, alert, not in any distress. Speech Clear. Not toxic looking, pleasant. HEENT: Atraumatic and Normocephalic, pupils equally reactive to light. Neck: supple, no JVD.   Chest:Good air entry bilaterally, no added  sounds. CVS: S1 S2 regular, no murmurs/gallups or rubs. Abdomen: Bowel sounds active, Non tender and not distended with no gaurding, rigidity or rebound. Extremities: B/L Lower Ext shows no edema, both legs are warm to touch Neurology: Awake alert, and oriented X 3, CN II-XII grossly intact, Non focal Skin:No Rash  Data Review Lab Results  Component Value Date   HGBA1C 6.2 (H) 07/04/2016   HGBA1C 6.1 (H) 03/21/2016    Depression screen PHQ 2/9 11/26/2016 05/21/2016 10/31/2015 05/09/2015 04/18/2015  Decreased Interest 0 2 0 0 0  Down, Depressed, Hopeless 0 0 0 0 0  PHQ - 2 Score 0 2 0 0 0  Altered sleeping - 3 - - -  Tired, decreased energy - 2 - - -  Change in appetite - 0 - - -  Feeling bad or failure about yourself  - 2 - - -  Trouble concentrating - 2 - - -  Moving slowly or fidgety/restless - 0 - - -  Suicidal thoughts - 0 - - -  PHQ-9 Score - 11 - - -      Assessment & Plan   1. Chronic combined systolic and diastolic CHF (congestive heart failure) (HCC) Stable, defer to cards  2. Essential hypertension Well controlled, same bp meds, no changes Low salt diet encouraged  3. Prediabetes - continue metoformin 500qd - POCT glucose (manual entry)  4. Insomnia - sleep hygiene tips provided - trial elavil 25 qhs prn for insomnia  5. Recent cerv fusion c4-c7 Doing well postop, defer to ortho.  6. tdap today.  Patient have been counseled extensively about nutrition and exercise  Return in about 3 months (around 02/23/2017), or if symptoms worsen or fail to improve.  The patient was given clear instructions to go to ER or return to medical center if symptoms don't improve, worsen or new problems develop. The patient verbalized understanding. The patient was told to call to get lab results if they haven't heard anything in the next week.   This note has been created with Surveyor, quantity. Any transcriptional errors are  unintentional.   Maren Reamer, MD, [place] [facility] [place], [place]   11/26/2016, 4:53 PM

## 2016-11-26 NOTE — Patient Instructions (Signed)
Insomnia Insomnia is a sleep disorder that makes it difficult to fall asleep or to stay asleep. Insomnia can cause tiredness (fatigue), low energy, difficulty concentrating, mood swings, and poor performance at work or school. There are three different ways to classify insomnia:  Difficulty falling asleep.  Difficulty staying asleep.  Waking up too early in the morning. Any type of insomnia can be long-term (chronic) or short-term (acute). Both are common. Short-term insomnia usually lasts for three months or less. Chronic insomnia occurs at least three times a week for longer than three months. What are the causes? Insomnia may be caused by another condition, situation, or substance, such as:  Anxiety.  Certain medicines.  Gastroesophageal reflux disease (GERD) or other gastrointestinal conditions.  Asthma or other breathing conditions.  Restless legs syndrome, sleep apnea, or other sleep disorders.  Chronic pain.  Menopause. This may include hot flashes.  Stroke.  Abuse of alcohol, tobacco, or illegal drugs.  Depression.  Caffeine.  Neurological disorders, such as Alzheimer disease.  An overactive thyroid (hyperthyroidism). The cause of insomnia may not be known. What increases the risk? Risk factors for insomnia include:  Gender. Women are more commonly affected than men.  Age. Insomnia is more common as you get older.  Stress. This may involve your professional or personal life.  Income. Insomnia is more common in people with lower income.  Lack of exercise.  Irregular work schedule or night shifts.  Traveling between different time zones. What are the signs or symptoms? If you have insomnia, trouble falling asleep or trouble staying asleep is the main symptom. This may lead to other symptoms, such as:  Feeling fatigued.  Feeling nervous about going to sleep.  Not feeling rested in the morning.  Having trouble concentrating.  Feeling irritable,  anxious, or depressed. How is this treated? Treatment for insomnia depends on the cause. If your insomnia is caused by an underlying condition, treatment will focus on addressing the condition. Treatment may also include:  Medicines to help you sleep.  Counseling or therapy.  Lifestyle adjustments. Follow these instructions at home:  Take medicines only as directed by your health care provider.  Keep regular sleeping and waking hours. Avoid naps.  Keep a sleep diary to help you and your health care provider figure out what could be causing your insomnia. Include:  When you sleep.  When you wake up during the night.  How well you sleep.  How rested you feel the next day.  Any side effects of medicines you are taking.  What you eat and drink.  Make your bedroom a comfortable place where it is easy to fall asleep:  Put up shades or special blackout curtains to block light from outside.  Use a white noise machine to block noise.  Keep the temperature cool.  Exercise regularly as directed by your health care provider. Avoid exercising right before bedtime.  Use relaxation techniques to manage stress. Ask your health care provider to suggest some techniques that may work well for you. These may include:  Breathing exercises.  Routines to release muscle tension.  Visualizing peaceful scenes.  Cut back on alcohol, caffeinated beverages, and cigarettes, especially close to bedtime. These can disrupt your sleep.  Do not overeat or eat spicy foods right before bedtime. This can lead to digestive discomfort that can make it hard for you to sleep.  Limit screen use before bedtime. This includes:  Watching TV.  Using your smartphone, tablet, and computer.  Stick to a   routine. This can help you fall asleep faster. Try to do a quiet activity, brush your teeth, and go to bed at the same time each night.  Get out of bed if you are still awake after 15 minutes of trying to  sleep. Keep the lights down, but try reading or doing a quiet activity. When you feel sleepy, go back to bed.  Make sure that you drive carefully. Avoid driving if you feel very sleepy.  Keep all follow-up appointments as directed by your health care provider. This is important. Contact a health care provider if:  You are tired throughout the day or have trouble in your daily routine due to sleepiness.  You continue to have sleep problems or your sleep problems get worse. Get help right away if:  You have serious thoughts about hurting yourself or someone else. This information is not intended to replace advice given to you by your health care provider. Make sure you discuss any questions you have with your health care provider. Document Released: 09/13/2000 Document Revised: 02/16/2016 Document Reviewed: 06/17/2014 Elsevier Interactive Patient Education  2017 [employer]vier Inc.  

## 2016-11-28 ENCOUNTER — Ambulatory Visit: Payer: Medicaid Other

## 2016-12-04 ENCOUNTER — Ambulatory Visit: Payer: Medicaid Other | Admitting: Physical Therapy

## 2017-01-01 ENCOUNTER — Other Ambulatory Visit: Payer: Self-pay | Admitting: Cardiovascular Disease

## 2017-02-13 ENCOUNTER — Encounter: Payer: Self-pay | Admitting: Internal Medicine

## 2017-02-19 ENCOUNTER — Other Ambulatory Visit: Payer: Self-pay

## 2017-02-19 ENCOUNTER — Ambulatory Visit: Payer: Medicaid Other | Attending: Internal Medicine | Admitting: Physician Assistant

## 2017-02-19 ENCOUNTER — Telehealth: Payer: Self-pay | Admitting: Cardiovascular Disease

## 2017-02-19 VITALS — BP 123/76 | HR 71 | Temp 97.4°F | Wt 156.2 lb

## 2017-02-19 DIAGNOSIS — I10 Essential (primary) hypertension: Secondary | ICD-10-CM

## 2017-02-19 DIAGNOSIS — R739 Hyperglycemia, unspecified: Secondary | ICD-10-CM | POA: Diagnosis not present

## 2017-02-19 DIAGNOSIS — R7303 Prediabetes: Secondary | ICD-10-CM | POA: Insufficient documentation

## 2017-02-19 DIAGNOSIS — Z7984 Long term (current) use of oral hypoglycemic drugs: Secondary | ICD-10-CM | POA: Insufficient documentation

## 2017-02-19 DIAGNOSIS — I5043 Acute on chronic combined systolic (congestive) and diastolic (congestive) heart failure: Secondary | ICD-10-CM | POA: Diagnosis not present

## 2017-02-19 DIAGNOSIS — I110 Hypertensive heart disease with heart failure: Secondary | ICD-10-CM | POA: Diagnosis not present

## 2017-02-19 DIAGNOSIS — F329 Major depressive disorder, single episode, unspecified: Secondary | ICD-10-CM | POA: Insufficient documentation

## 2017-02-19 DIAGNOSIS — F419 Anxiety disorder, unspecified: Secondary | ICD-10-CM | POA: Insufficient documentation

## 2017-02-19 DIAGNOSIS — I429 Cardiomyopathy, unspecified: Secondary | ICD-10-CM | POA: Diagnosis not present

## 2017-02-19 DIAGNOSIS — R0602 Shortness of breath: Secondary | ICD-10-CM | POA: Diagnosis present

## 2017-02-19 LAB — GLUCOSE, POCT (MANUAL RESULT ENTRY): POC GLUCOSE: 96 mg/dL (ref 70–99)

## 2017-02-19 LAB — POCT GLYCOSYLATED HEMOGLOBIN (HGB A1C): Hemoglobin A1C: 6.4

## 2017-02-19 MED FILL — $VIAGRA 25 MG TABLET: 25 MG | 30 days supply | Qty: 10 | Fill #0

## 2017-02-19 NOTE — Progress Notes (Signed)
Jonathan Dickson, is a 61 y.o. male  VOJ:[id number]  [id number]:[id number]  DOB - [birth year]  Subjective:  Chief Complaint and HPI: Jonathan Dickson is a 62 y.o. male here today c/o 1 year h/o progressive worsening of SOB.  He says it feels similar to when he had asthma as a child.  He denies CP/palpitations.  Last saw cardiology in 09/2016.  Last echo was 05/2016.  No edema.  He walks to [place] [place] from where he lives and starts to notice SOB when he is on his way home from [employer].  No SOB at rest.     ROS:   Constitutional:  No f/c, No night sweats, No unexplained weight loss. EENT:  No vision changes, No blurry vision, No hearing changes. No mouth, throat, or ear problems.  Respiratory: No cough, + SOB Cardiac: No CP, no palpitations GI:  No abd pain, No N/V/D. GU: No Urinary s/sx Musculoskeletal: No joint pain Neuro: No headache, no dizziness, no motor weakness.  Skin: No rash Endocrine:  No polydipsia. No polyuria.  Psych: Denies SI/HI  No problems updated.  ALLERGIES: Allergies  Allergen Reactions  . Peanut-Containing Drug Products Swelling  . Penicillins Other (See Comments)    CONVULSIONS Has patient had a PCN reaction causing immediate rash, facial/tongue/throat swelling, SOB or lightheadedness with hypotension: No Has patient had a PCN reaction causing severe rash involving mucus membranes or skin necrosis: No Has patient had a PCN reaction that required hospitalization No Has patient had a PCN reaction occurring within the last 10 years: No If all of the above answers are "NO", then may proceed with Cephalosporin use.   . Decadron [Dexamethasone] Itching    PAST MEDICAL HISTORY: Past Medical History:  Diagnosis Date  . Abnormal TSH 02/2016  . Anxiety   . Cervical disc disease   . Chronic combined systolic and diastolic CHF (congestive heart failure) ([place])    a. 01/2016 Echo: EF 25%, inf AK, diffuse sev HK, Gr1 DD, mild MR.  . Depression   . Hyperglycemia   . Hypertension    . Hypertensive heart disease   . Myocardial infarction [facility])    ??  maybe  . NICM (nonischemic cardiomyopathy) ([place])    a. 01/2016 Echo: EF 25%, inf AK, diffuse sev HK, Gr1 DD, mild MR; b. 01/2016 MV: EF 22%, no isch/infarct;  c. 02/2016 Cath: Nl cors, EF 35-45%.  . Pre-diabetes   . Seizures ([place])    with penicillin    MEDICATIONS AT HOME: Prior to Admission medications   Medication Sig Start Date End Date Taking? Authorizing Provider  amitriptyline (ELAVIL) 25 MG tablet Take 1 tablet (25 mg total) by mouth at bedtime as needed for sleep. 11/26/16  Yes Langeland, Dawn T, MD  carvedilol (COREG) 12.5 MG tablet Take 1 tablet (12.5 mg total) by mouth 2 (two) times daily. 01/01/17  Yes Lorretta Harp, MD  cholecalciferol (VITAMIN D) 1000 units tablet Take 1,000 Units by mouth daily.   Yes [provider]  hydrochlorothiazide (HYDRODIURIL) 25 MG tablet Take 1 tablet (25 mg total) by mouth daily. 03/21/16  Yes Rogelia Mire, NP  lisinopril (PRINIVIL,ZESTRIL) 40 MG tablet Take 1 tablet (40 mg total) by mouth daily. Must have OV 03/21/16  Yes Rogelia Mire, NP  metFORMIN (GLUCOPHAGE XR) 500 MG 24 hr tablet Take 1 tablet (500 mg total) by mouth daily with breakfast. 11/26/16  Yes Langeland, Dawn T, MD  sildenafil (VIAGRA) 25 MG tablet Take 1 tablet (25  mg total) by mouth daily as needed for erectile dysfunction. 09/02/16  Yes Tresa Garter, MD  traMADol-acetaminophen (ULTRACET) 37.5-325 MG tablet Take 1 tablet by mouth every 6 (six) hours as needed.   Yes [provider]     Objective:  EXAM:   Vitals:   02/19/17 1130  BP: 123/76  Pulse: 71  Temp: 97.4 F (36.3 C)  TempSrc: Oral  SpO2: 95%  Weight: 156 lb 3.2 oz (70.9 kg)    General appearance : A&OX3. NAD. Non-toxic-appearing HEENT: Atraumatic and Normocephalic.  PERRLA. EOM intact.  TM clear B. Mouth-MMM, post pharynx WNL w/o erythema, No PND. Neck: supple, no JVD. No cervical lymphadenopathy. No  thyromegaly Chest/Lungs:  Breathing-non-labored, Good air entry bilaterally, breath sounds normal without rales, rhonchi, or wheezing  CVS: S1 S2 regular, no murmurs, gallops, rubs  Extremities: Bilateral Lower Ext shows no edema, both legs are warm to touch with = pulse throughout Neurology:  CN II-XII grossly intact, Non focal.   Psych:  TP linear. J/I WNL. Normal speech. Appropriate eye contact and affect.  Skin:  No Rash  Data Review Lab Results  Component Value Date   HGBA1C 6.4 02/19/2017   HGBA1C 6.2 (H) 07/04/2016   HGBA1C 6.1 (H) 03/21/2016     Assessment & Plan   1. Essential hypertension Controlled today.  Continue current regimen - Comprehensive metabolic panel - CBC with Differential/Platelet  2. Hyperglycemia I have had a lengthy discussion and provided education about insulin resistance and the intake of too much sugar/refined carbohydrates.  I have advised the patient to work at a goal of eliminating sugary drinks, candy, desserts, sweets, refined sugars, processed foods, and white carbohydrates.  The patient expresses understanding.  - Glucose (CBG) - HgB A1c  3. Shortness of breath No immediate evidence of worsening failure.  S/sx X 1 year - Comprehensive metabolic panel - Brain natriuretic peptide  4. Acute on chronic combined systolic and diastolic congestive heart failure (HCC) - Brain natriuretic peptide He will schedule an appt with his cardiologist ASAP.    Reviewed EKG with Dr Doreene Burke and discussed case.  No acute changes on EKG.   Patient have been counseled extensively about nutrition and exercise  Return in about 1 month (around 03/22/2017) for assign new PCP; f/up SOB/htn.  The patient was given clear instructions to call 911 or return to medical center if symptoms worsen or new problems develop. The patient verbalized understanding. The patient was told to call to get lab results if they haven't heard anything in the next week.     Freeman Caldron, PA-C [facility] and [place], [place]   02/19/2017, 11:54 AMPatient ID: Jonathan Dickson, male   DOB: Oct 12, 1955, 61 y.o.   MRN: [id number]

## 2017-02-19 NOTE — Patient Instructions (Addendum)
Schedule appt with cardiology as soon as possible.

## 2017-02-19 NOTE — Telephone Encounter (Signed)
Returned call to patient. Confirmed his appt for tomorrow. No symptoms apart from SOB. He states he does not need further assistance at this time. Advised to seek ED eval if SOB worsens prior to appt on 5/24 @ 3:40pm

## 2017-02-19 NOTE — Telephone Encounter (Signed)
New message   App made to see Dr. [name] on  5.24   Pt c/o Shortness Of Breath: STAT if SOB developed within the last 24 hours or pt is noticeably SOB on the phone  1. Are you currently SOB (can you hear that pt is SOB on the phone)? No   2. How long have you been experiencing SOB? Couple of months   3. Are you SOB when sitting or when up moving around? Moving around   4. Are you currently experiencing any other symptoms? No

## 2017-02-20 ENCOUNTER — Encounter: Payer: Self-pay | Admitting: Cardiovascular Disease

## 2017-02-20 ENCOUNTER — Ambulatory Visit (INDEPENDENT_AMBULATORY_CARE_PROVIDER_SITE_OTHER): Payer: Medicaid Other | Admitting: Cardiovascular Disease

## 2017-02-20 VITALS — BP 110/80 | HR 74 | Ht 66.0 in | Wt 157.0 lb

## 2017-02-20 DIAGNOSIS — R0602 Shortness of breath: Secondary | ICD-10-CM | POA: Diagnosis not present

## 2017-02-20 DIAGNOSIS — I428 Other cardiomyopathies: Secondary | ICD-10-CM

## 2017-02-20 DIAGNOSIS — I10 Essential (primary) hypertension: Secondary | ICD-10-CM | POA: Diagnosis not present

## 2017-02-20 LAB — COMPREHENSIVE METABOLIC PANEL
A/G RATIO: 1.5 (ref 1.2–2.2)
ALBUMIN: 4.3 g/dL (ref 3.6–4.8)
ALT: 20 IU/L (ref 0–44)
AST: 24 IU/L (ref 0–40)
Alkaline Phosphatase: 75 IU/L (ref 39–117)
BILIRUBIN TOTAL: 0.4 mg/dL (ref 0.0–1.2)
BUN / CREAT RATIO: 22 (ref 10–24)
BUN: 25 mg/dL (ref 8–27)
CHLORIDE: 98 mmol/L (ref 96–106)
CO2: 24 mmol/L (ref 18–29)
Calcium: 10 mg/dL (ref 8.6–10.2)
Creatinine, Ser: 1.15 mg/dL (ref 0.76–1.27)
GFR calc non Af Amer: 68 mL/min/{1.73_m2} (ref >59)
GFR, EST AFRICAN AMERICAN: 79 mL/min/{1.73_m2} (ref >59)
Globulin, Total: 2.8 g/dL (ref 1.5–4.5)
Glucose: 81 mg/dL (ref 65–99)
POTASSIUM: 4.2 mmol/L (ref 3.5–5.2)
Sodium: 136 mmol/L (ref 134–144)
TOTAL PROTEIN: 7.1 g/dL (ref 6.0–8.5)

## 2017-02-20 LAB — CBC WITH DIFFERENTIAL/PLATELET
BASOS: 0 %
Basophils Absolute: 0 10*3/uL (ref 0.0–0.2)
EOS (ABSOLUTE): 0.2 10*3/uL (ref 0.0–0.4)
Eos: 3 %
Hematocrit: 41.9 % (ref 37.5–51.0)
Hemoglobin: 13.8 g/dL (ref 13.0–17.7)
Immature Grans (Abs): 0 10*3/uL (ref 0.0–0.1)
Immature Granulocytes: 0 %
Lymphocytes Absolute: 2.3 10*3/uL (ref 0.7–3.1)
Lymphs: 42 %
MCH: 28.6 pg (ref 26.6–33.0)
MCHC: 32.9 g/dL (ref 31.5–35.7)
MCV: 87 fL (ref 79–97)
MONOS ABS: 0.4 10*3/uL (ref 0.1–0.9)
Monocytes: 8 %
NEUTROS ABS: 2.6 10*3/uL (ref 1.4–7.0)
Neutrophils: 47 %
PLATELETS: 200 10*3/uL (ref 150–379)
RBC: 4.82 x10E6/uL (ref 4.14–5.80)
RDW: 14.8 % (ref 12.3–15.4)
WBC: 5.6 10*3/uL (ref 3.4–10.8)

## 2017-02-20 LAB — BRAIN NATRIURETIC PEPTIDE: BNP: 9.1 pg/mL (ref 0.0–100.0)

## 2017-02-20 NOTE — Patient Instructions (Signed)
Medication Instructions: Your physician recommends that you continue on your current medications as directed. Please refer to the Current Medication list given to you today.  Testing/Procedures: Your physician has requested that you have an echocardiogram. Echocardiography is a painless test that uses sound waves to create images of your heart. It provides your doctor with information about the size and shape of your heart and how well your heart's chambers and valves are working. This procedure takes approximately one hour. There are no restrictions for this procedure.  Follow-Up: Your physician recommends that you schedule a follow-up appointment in: 4-6 weeks with Dr. [name].  If you need a refill on your cardiac medications before your next appointment, please call your pharmacy.

## 2017-02-20 NOTE — Assessment & Plan Note (Signed)
History of essential hypertension blood pressure is 110/80. He is on hydrochlorothiazide and carvedilol as well as lisinopril. Continue current meds at current dosing

## 2017-02-20 NOTE — Assessment & Plan Note (Signed)
History of nonischemic cardiomyopathy with most recent ejection fraction showed an EF of 35-40%. He has normal coronary arteries by left heart cath performed by Dr. Ellyn Hack 03/11/16. Last several months she's noticed increasing dyspnea on exertion. I'm going to repeat a 2-D echo. He may benefit from the addition of Entresto.

## 2017-02-20 NOTE — Progress Notes (Signed)
02/20/2017 [name]   05/31/1956  [id number]  Primary Physician Maren Reamer, MD Primary Cardiologist: Lorretta Harp MD Renae Gloss  HPI:   [name] is a 61 year old fit appearing widowed African-American male father of 77, grandfather to 3 grandchildren who is retired since I last saw him.. I last saw him in the office 06/25/16 . He was scheduled to have cervical laminectomy today but this was put off because of need for cardiac clearance.His only risk factor is hypertension. Smoking 13 years ago. He's never had a heart attack or stroke. He denies chest pain or shortness of breath. I performed 2-D echocardiography which revealed unexpectedly ejection fraction of 25% with inferior wall akinesis and severe hypokinesia otherwise. A Myoview stress test confirmed the deep diminished ejection fraction but did not show any areas of ischemia or infarction. 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 He underwent right left heart cath by Dr. Ellyn Hack 03/11/16 confirming this. Ultimately, he is begun on carvedilol and lisinopril. His ejection fraction improved up to 35-40% by 2-D echocardiogram performed 06/21/16. He is completely symptomatic. Based on this I cleared  him for for his elective cervical laminectomy and mildly elevated risk. Since I saw him back in September he's noticed increasing dyspnea on exertion. He has no peripheral edema and is aware salt restriction.   Current Outpatient Prescriptions  Medication Sig Dispense Refill  . amitriptyline (ELAVIL) 25 MG tablet Take 1 tablet (25 mg total) by mouth at bedtime as needed for sleep. 30 tablet 3  . carvedilol (COREG) 12.5 MG tablet Take 1 tablet (12.5 mg total) by mouth 2 (two) times daily. 60 tablet 3  . cholecalciferol (VITAMIN D) 1000 units tablet Take 1,000 Units by mouth daily.    . hydrochlorothiazide (HYDRODIURIL) 25 MG tablet Take 1 tablet (25 mg total) by mouth daily. 30 tablet 11  . lisinopril  (PRINIVIL,ZESTRIL) 40 MG tablet Take 1 tablet (40 mg total) by mouth daily. Must have OV 90 tablet 3  . metFORMIN (GLUCOPHAGE XR) 500 MG 24 hr tablet Take 1 tablet (500 mg total) by mouth daily with breakfast. 90 tablet 3  . sildenafil (VIAGRA) 25 MG tablet Take 1 tablet (25 mg total) by mouth daily as needed for erectile dysfunction. 10 tablet 3  . traMADol-acetaminophen (ULTRACET) 37.5-325 MG tablet Take 1 tablet by mouth every 6 (six) hours as needed.     No current facility-administered medications for this visit.     Allergies  Allergen Reactions  . Peanut-Containing Drug Products Swelling  . Penicillins Other (See Comments)    CONVULSIONS Has patient had a PCN reaction causing immediate rash, facial/tongue/throat swelling, SOB or lightheadedness with hypotension: No Has patient had a PCN reaction causing severe rash involving mucus membranes or skin necrosis: No Has patient had a PCN reaction that required hospitalization No Has patient had a PCN reaction occurring within the last 10 years: No If all of the above answers are "NO", then may proceed with Cephalosporin use.   . Decadron [Dexamethasone] Itching    Social History   Social History  . Marital status: Legally Separated    Spouse name: N/A  . Number of children: 3  . Years of education: N/A   Occupational History  . Not on file.   Social History Main Topics  . Smoking status: Former Smoker    Packs/day: 1.50    Years: 8.00    Quit date: 03/15/2003  . Smokeless tobacco:  Never Used  . Alcohol use No     Comment: none since 2004  . Drug use: No     Comment: former  none since 2004  . Sexual activity: Not on file   Other Topics Concern  . Not on file   Social History Narrative  . No narrative on file     Review of Systems: General: negative for chills, fever, night sweats or weight changes.  Cardiovascular: negative for chest pain, dyspnea on exertion, edema, orthopnea, palpitations, paroxysmal nocturnal  dyspnea or shortness of breath Dermatological: negative for rash Respiratory: negative for cough or wheezing Urologic: negative for hematuria Abdominal: negative for nausea, vomiting, diarrhea, bright red blood per rectum, melena, or hematemesis Neurologic: negative for visual changes, syncope, or dizziness All other systems reviewed and are otherwise negative except as noted above.    Blood pressure 110/80, pulse 74, height 5\' 6"  (1.676 m), weight 157 lb (71.2 kg).  General appearance: alert and no distress Neck: no adenopathy, no carotid bruit, no JVD, supple, symmetrical, trachea midline and thyroid not enlarged, symmetric, no tenderness/mass/nodules Lungs: clear to auscultation bilaterally Heart: regular rate and rhythm, S1, S2 normal, no murmur, click, rub or gallop Extremities: extremities normal, atraumatic, no cyanosis or edema  EKG not performed today  ASSESSMENT AND PLAN:   Essential hypertension History of essential hypertension blood pressure is 110/80. He is on hydrochlorothiazide and carvedilol as well as lisinopril. Continue current meds at current dosing  NICM (nonischemic cardiomyopathy) ([place]) History of nonischemic cardiomyopathy with most recent ejection fraction showed an EF of 35-40%. He has normal coronary arteries by left heart cath performed by Dr. Ellyn Hack 03/11/16. Last several months she's noticed increasing dyspnea on exertion. I'm going to repeat a 2-D echo. He may benefit from the addition of Entresto.      Lorretta Harp MD FACP,FACC,FAHA, [facility] 02/20/2017 4:05 PM

## 2017-02-28 ENCOUNTER — Telehealth: Payer: Self-pay

## 2017-02-28 NOTE — Telephone Encounter (Signed)
Contacted pt to go over lab results pt didn't answer lvm asking pt to give me a call at his earliest convienence    If pt calls back please give results: All of your labs are reassuring. Your kidney function, liver function, electrolytes, blood sugar, blood count, and the test for worsening heart failure were all negative. F/up with cardiologist as planned.

## 2017-02-28 NOTE — Telephone Encounter (Signed)
Pt returned call is a aware of his lab results and doesn't have any questions or concerns

## 2017-03-04 ENCOUNTER — Telehealth: Payer: Self-pay | Admitting: Internal Medicine

## 2017-03-04 DIAGNOSIS — I428 Other cardiomyopathies: Secondary | ICD-10-CM

## 2017-03-04 MED ORDER — HYDROCHLOROTHIAZIDE 25 MG PO TABS: 25.0000 mg | ORAL_TABLET | Freq: Every day | ORAL | 0 refills | Status: DC

## 2017-03-04 MED ORDER — CARVEDILOL 12.5 MG PO TABS: 12.5000 mg | ORAL_TABLET | Freq: Two times a day (BID) | ORAL | 0 refills | Status: DC

## 2017-03-04 MED ORDER — LISINOPRIL 40 MG PO TABS: 40.0000 mg | ORAL_TABLET | Freq: Every day | ORAL | 0 refills | Status: DC

## 2017-03-04 NOTE — Telephone Encounter (Signed)
Refilled BP meds x 30 days - needs office visit with new PCP

## 2017-03-04 NOTE — Telephone Encounter (Signed)
Patient called requesting medication refill on BP medication, pt would like medication sent to [facility] family pharmacy.

## 2017-03-06 ENCOUNTER — Ambulatory Visit (HOSPITAL_COMMUNITY): Payer: Medicaid Other | Attending: Cardiology

## 2017-03-06 ENCOUNTER — Other Ambulatory Visit: Payer: Self-pay

## 2017-03-06 DIAGNOSIS — I509 Heart failure, unspecified: Secondary | ICD-10-CM | POA: Diagnosis not present

## 2017-03-06 DIAGNOSIS — I110 Hypertensive heart disease with heart failure: Secondary | ICD-10-CM | POA: Insufficient documentation

## 2017-03-06 DIAGNOSIS — Z87891 Personal history of nicotine dependence: Secondary | ICD-10-CM | POA: Diagnosis not present

## 2017-03-06 DIAGNOSIS — I420 Dilated cardiomyopathy: Secondary | ICD-10-CM | POA: Diagnosis not present

## 2017-03-06 DIAGNOSIS — I081 Rheumatic disorders of both mitral and tricuspid valves: Secondary | ICD-10-CM | POA: Insufficient documentation

## 2017-03-06 DIAGNOSIS — R0602 Shortness of breath: Secondary | ICD-10-CM

## 2017-03-06 DIAGNOSIS — I371 Nonrheumatic pulmonary valve insufficiency: Secondary | ICD-10-CM | POA: Diagnosis not present

## 2017-03-21 ENCOUNTER — Other Ambulatory Visit: Payer: Self-pay | Admitting: Internal Medicine

## 2017-03-21 ENCOUNTER — Ambulatory Visit: Payer: Medicaid Other | Attending: Internal Medicine | Admitting: Internal Medicine

## 2017-03-21 ENCOUNTER — Encounter: Payer: Self-pay | Admitting: Internal Medicine

## 2017-03-21 VITALS — BP 114/73 | HR 83 | Temp 98.1°F | Resp 16 | Ht 66.0 in | Wt 153.0 lb

## 2017-03-21 DIAGNOSIS — R918 Other nonspecific abnormal finding of lung field: Secondary | ICD-10-CM

## 2017-03-21 DIAGNOSIS — R0981 Nasal congestion: Secondary | ICD-10-CM

## 2017-03-21 DIAGNOSIS — I10 Essential (primary) hypertension: Secondary | ICD-10-CM

## 2017-03-21 DIAGNOSIS — R0982 Postnasal drip: Secondary | ICD-10-CM

## 2017-03-21 DIAGNOSIS — R7303 Prediabetes: Secondary | ICD-10-CM

## 2017-03-21 DIAGNOSIS — I428 Other cardiomyopathies: Secondary | ICD-10-CM | POA: Diagnosis not present

## 2017-03-21 LAB — GLUCOSE, POCT (MANUAL RESULT ENTRY): POC GLUCOSE: 102 mg/dL — AB (ref 70–99)

## 2017-03-21 MED ORDER — LISINOPRIL 40 MG PO TABS: 40.0000 mg | ORAL_TABLET | Freq: Every day | ORAL | 0 refills | Status: DC

## 2017-03-21 MED ORDER — LORATADINE 10 MG PO TABS: 10.0000 mg | ORAL_TABLET | Freq: Every day | ORAL | 3 refills | Status: DC

## 2017-03-21 NOTE — Progress Notes (Signed)
Patient ID: ARNOL MCGIBBON, male    DOB: 1956-09-16  MRN: [id number]  CC: Follow-up (ECHO ); Medication Refill; and Shortness of Breath   Subjective: Jonathan Dickson is a 61 y.o. male who presents for f/u visit to become established with me as his PCP. His concerns today include: Pt with hx of NICM, HTN, preDM, insomnia   1. NICM:  -still some SOB when walking more than 4 blocks.  Has to stop for a few mins to catch breath before he gets home when walking to his neighborhood store. -no SOB at rest  -No chronic cough or fever. -Saw cardiology recently. Repeat echo revealed improvement in EF to 40-45 %  2.  Sinus congestion at nights with post nasal drip.  Keeps him up. Uses Vicks nasal inhaler and saline nasal spray -Denies other allergy symptoms No discolored mucus from the nose. No feeling of sinus pressure  3.  HTN: Compliant with meds and salt restriction -no LE edema or CP  4.  Pre-DM tolerating Metformin  Patient Active Problem List   Diagnosis Date Noted  . Cervical disc disease with myelopathy 07/10/2016  . Chronic combined systolic and diastolic CHF (congestive heart failure) ([place])   . Acute on chronic combined systolic and diastolic congestive heart failure ([place]) 03/12/2016  . NICM (nonischemic cardiomyopathy) ([place]) 03/12/2016  . Abnormal TSH 03/12/2016  . Hyperglycemia 03/12/2016  . Chest pain 03/12/2016  . Hypertensive heart disease 03/12/2016  . Congestive dilated cardiomyopathy ([place]) 03/11/2016  . Left ventricular dysfunction 03/06/2016  . Cervical radiculitis 04/19/2015  . Essential hypertension 09/27/2014     Current Outpatient Prescriptions on File Prior to Visit  Medication Sig Dispense Refill  . amitriptyline (ELAVIL) 25 MG tablet Take 1 tablet (25 mg total) by mouth at bedtime as needed for sleep. 30 tablet 3  . carvedilol (COREG) 12.5 MG tablet Take 1 tablet (12.5 mg total) by mouth 2 (two) times daily. 60 tablet 0  . cholecalciferol (VITAMIN D) 1000  units tablet Take 1,000 Units by mouth daily.    . hydrochlorothiazide (HYDRODIURIL) 25 MG tablet Take 1 tablet (25 mg total) by mouth daily. 30 tablet 0  . metFORMIN (GLUCOPHAGE XR) 500 MG 24 hr tablet Take 1 tablet (500 mg total) by mouth daily with breakfast. 90 tablet 3  . sildenafil (VIAGRA) 25 MG tablet Take 1 tablet (25 mg total) by mouth daily as needed for erectile dysfunction. 10 tablet 3  . traMADol-acetaminophen (ULTRACET) 37.5-325 MG tablet Take 1 tablet by mouth every 6 (six) hours as needed.     No current facility-administered medications on file prior to visit.     Allergies  Allergen Reactions  . Peanut-Containing Drug Products Swelling  . Penicillins Other (See Comments)    CONVULSIONS Has patient had a PCN reaction causing immediate rash, facial/tongue/throat swelling, SOB or lightheadedness with hypotension: No Has patient had a PCN reaction causing severe rash involving mucus membranes or skin necrosis: No Has patient had a PCN reaction that required hospitalization No Has patient had a PCN reaction occurring within the last 10 years: No If all of the above answers are "NO", then may proceed with Cephalosporin use.   . Decadron [Dexamethasone] Itching    Social History   Social History  . Marital status: Legally Separated    Spouse name: N/A  . Number of children: 3  . Years of education: N/A   Occupational History  . Not on file.   Social History Main Topics  .  Smoking status: Former Smoker    Packs/day: 1.50    Years: 8.00    Quit date: 03/15/2003  . Smokeless tobacco: Never Used  . Alcohol use No     Comment: none since 2004  . Drug use: No     Comment: former  none since 2004  . Sexual activity: Not on file   Other Topics Concern  . Not on file   Social History Narrative  . No narrative on file    Family History  Problem Relation Age of Onset  . Diabetes Mother   . Diabetes Sister     Past Surgical History:  Procedure Laterality  Date  . ANTERIOR CERVICAL DECOMP/DISCECTOMY FUSION N/A 07/10/2016   Procedure: ANTERIOR CERVICAL DECOMPRESSION FUSION CERVICAL 4-5, CERVICAL 5-6, CERVICAL 6-7 WITH INSTRUMENTATION AND ALLOGRAFT;  Surgeon: [name], MD;  Location: [facility];  Service: Orthopedics;  Laterality: N/A;  . CARDIAC CATHETERIZATION N/A 03/11/2016   Procedure: Left Heart Cath and Coronary Angiography;  Surgeon: [name], MD;  Location: [facility];  Service: Cardiovascular;  Laterality: N/A;  . Thumb surgery Right     ROS: Review of Systems  PHYSICAL EXAM: BP 114/73 (BP Location: Left Arm, Patient Position: Sitting, Cuff Size: Normal)   Pulse 83   Temp 98.1 F (36.7 C) (Oral)   Resp 16   Ht 5\' 6"  (1.676 m)   Wt 153 lb (69.4 kg)   SpO2 96%   BMI 24.69 kg/m   Wt Readings from Last 3 Encounters:  03/21/17 153 lb (69.4 kg)  02/20/17 157 lb (71.2 kg)  02/19/17 156 lb 3.2 oz (70.9 kg)   Physical Exam  General appearance - alert, well appearing, and in no distress Mental status - alert, oriented to person, place, and time, normal mood, behavior, speech, dress, motor activity, and thought processes Nose - patent. No enlargement of nasal troponins Mouth - oral mucosa moist no oral lesions. Neck -neck is supple, no thyroid enlargement Chest - fine crackles heard at both bases. Other lung fields are clear. Heart -RRR, no gallops no murmurs Extremities - no lower extremity edema  ASSESSMENT AND PLAN: 1. NICM (nonischemic cardiomyopathy) ([place]) -Recently seen by cardiology. Continue current medications. EF improved. - lisinopril (PRINIVIL,ZESTRIL) 40 MG tablet; Take 1 tablet (40 mg total) by mouth daily. Must have OV  Dispense: 30 tablet; Refill: 0  2. Essential hypertension At goal - lisinopril (PRINIVIL,ZESTRIL) 40 MG tablet; Take 1 tablet (40 mg total) by mouth daily. Must have OV  Dispense: 30 tablet; Refill: 0  3. Prediabetes Continue metformin. He is doing well with healthy eating and  gets an adequate exercise walking about 6 blocks several times a week - Glucose (CBG)  4. Sinus congestion -I wanted to try patient with Flonase but he had an allergic reaction to Decadron in the past. We will give Claritin to help with the sinus congestion and postnasal drip - loratadine (CLARITIN) 10 MG tablet; Take 1 tablet (10 mg total) by mouth daily.  Dispense: 30 tablet; Refill: 3  5. Postnasal drip  6. Lung field abnormal finding on examination - DG Chest 2 View; Future   Patient was given the opportunity to ask questions.  Patient verbalized understanding of the plan and was able to repeat key elements of the plan.   Orders Placed This Encounter  Procedures  . DG Chest 2 View  . Glucose (CBG)     Requested Prescriptions   Signed Prescriptions Disp Refills  . lisinopril (  PRINIVIL,ZESTRIL) 40 MG tablet 30 tablet 0    Sig: Take 1 tablet (40 mg total) by mouth daily. Must have OV  . loratadine (CLARITIN) 10 MG tablet 30 tablet 3    Sig: Take 1 tablet (10 mg total) by mouth daily.    Return in about 3 months (around 06/21/2017).  Karle Plumber, MD, FACP

## 2017-03-21 NOTE — Patient Instructions (Signed)
Use the Claritin to help decrease sinus congestion and post nasal drip.   Continue current medications.    Please get the chest x-ray done at your earliest convenience.

## 2017-03-24 ENCOUNTER — Ambulatory Visit (HOSPITAL_COMMUNITY)
Admission: RE | Admit: 2017-03-24 | Discharge: 2017-03-24 | Disposition: A | Discharge status: Home / Self Care | Payer: Medicaid Other | Source: Ambulatory Visit | Attending: Internal Medicine | Admitting: Internal Medicine

## 2017-03-24 DIAGNOSIS — R0602 Shortness of breath: Secondary | ICD-10-CM | POA: Diagnosis present

## 2017-03-24 DIAGNOSIS — R918 Other nonspecific abnormal finding of lung field: Secondary | ICD-10-CM | POA: Insufficient documentation

## 2017-03-26 ENCOUNTER — Encounter: Payer: Self-pay | Admitting: Cardiovascular Disease

## 2017-03-26 ENCOUNTER — Ambulatory Visit (INDEPENDENT_AMBULATORY_CARE_PROVIDER_SITE_OTHER): Payer: Medicaid Other | Admitting: Cardiovascular Disease

## 2017-03-26 VITALS — BP 139/85 | HR 92 | Ht 66.0 in | Wt 155.0 lb

## 2017-03-26 DIAGNOSIS — I519 Heart disease, unspecified: Secondary | ICD-10-CM

## 2017-03-26 DIAGNOSIS — I428 Other cardiomyopathies: Secondary | ICD-10-CM

## 2017-03-26 MED ORDER — CARVEDILOL 12.5 MG PO TABS: 18.7500 mg | ORAL_TABLET | Freq: Two times a day (BID) | ORAL | 6 refills | Status: DC

## 2017-03-26 NOTE — Assessment & Plan Note (Signed)
History of nonischemic cardiomyopathy demonstrated by cardiac catheterization performed by Dr. Ellyn Hack 03/11/16. We have been optimizing his medical therapy. EF has improved by 2-D echo performed 03/06/17 up to the 40-45% range. He does have some mild dyspnea. I'm going to titrate his carvedilol from 12.5-18.75 and ultimately to 25 mg by mouth twice a day.

## 2017-03-26 NOTE — Patient Instructions (Signed)
Medication Instructions: Increase Carvedilol to 18.75 mg (1 and 1/2 tablets) by mouth twice daily.   Testing/Procedures: Your physician has requested that you have an echocardiogram in 6 months (December 2018). Echocardiography is a painless test that uses sound waves to create images of your heart. It provides your doctor with information about the size and shape of your heart and how well your heart's chambers and valves are working. This procedure takes approximately one hour. There are no restrictions for this procedure.  Follow-Up: Your physician recommends that you schedule a follow-up appointment in: 1 months with [facility] to taitrate Carvedilol to 25 mg BID.  Your physician wants you to follow-up in: 6 months with Dr. [name] after echo. You will receive a reminder letter in the mail two months in advance. If you don't receive a letter, please call our office to schedule the follow-up appointment.  If you need a refill on your cardiac medications before your next appointment, please call your pharmacy.

## 2017-03-26 NOTE — Assessment & Plan Note (Signed)
History of essential hypertension blood pressure measured 139/85. He is on carvedilol, lisinopril and hydrochlorothiazide. . Current meds current At current dosing.

## 2017-03-26 NOTE — Progress Notes (Signed)
03/26/2017 Jonathan Dickson   [name] 02, 1956  [id number]  Primary Physician Ladell Pier, MD Primary Cardiologist: Lorretta Harp MD Renae Gloss  HPI:  Jonathan Dickson is a 61 year old fit appearing widowed African-American male father of 51, grandfather to 3 grandchildren who is retired since I last saw him.. I last saw him in the office 02/20/17. He was scheduled to have cervical laminectomy today but this was put off because of need for cardiac clearance.His only risk factor is hypertension. Smoking 13 years ago. He's never had a heart attack or stroke. He denies chest pain or shortness of breath. I performed 2-D echocardiography which revealed unexpectedly ejection fraction of 25% with inferior wall akinesis and severe hypokinesia otherwise. A Myoview stress test confirmed the deep diminished ejection fraction but did not show any areas of ischemia or infarction. 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 He underwent right left heart cath by Dr. Ellyn Hack 03/11/16 confirming this. Ultimately, he is begun on carvedilol and lisinopril. His ejection fraction improved up to 35-40% by 2-D echocardiogram performed 06/21/16. He is completely symptomatic. Based on this I cleared  him for for his elective cervical laminectomy and mildly elevated risk. Since I saw him in the office 02/20/17 he does complain of mild dyspnea. His 2-D echo performed/7/18 showed mild improvement in his LV function with an EF of 40-45%.   Current Outpatient Prescriptions  Medication Sig Dispense Refill  . amitriptyline (ELAVIL) 25 MG tablet Take 1 tablet (25 mg total) by mouth at bedtime as needed for sleep. 30 tablet 3  . carvedilol (COREG) 12.5 MG tablet Take 1.5 tablets (18.75 mg total) by mouth 2 (two) times daily. 90 tablet 6  . cholecalciferol (VITAMIN D) 1000 units tablet Take 1,000 Units by mouth daily.    . hydrochlorothiazide (HYDRODIURIL) 25 MG tablet Take 1 tablet (25 mg total) by mouth daily. 30  tablet 0  . lisinopril (PRINIVIL,ZESTRIL) 40 MG tablet Take 1 tablet (40 mg total) by mouth daily. Must have OV 30 tablet 0  . loratadine (CLARITIN) 10 MG tablet Take 1 tablet (10 mg total) by mouth daily. 30 tablet 3  . metFORMIN (GLUCOPHAGE XR) 500 MG 24 hr tablet Take 1 tablet (500 mg total) by mouth daily with breakfast. 90 tablet 3  . sildenafil (VIAGRA) 25 MG tablet Take 1 tablet (25 mg total) by mouth daily as needed for erectile dysfunction. 10 tablet 3  . traMADol-acetaminophen (ULTRACET) 37.5-325 MG tablet Take 1 tablet by mouth every 6 (six) hours as needed.     No current facility-administered medications for this visit.     Allergies  Allergen Reactions  . Peanut-Containing Drug Products Swelling  . Penicillins Other (See Comments)    CONVULSIONS Has patient had a PCN reaction causing immediate rash, facial/tongue/throat swelling, SOB or lightheadedness with hypotension: No Has patient had a PCN reaction causing severe rash involving mucus membranes or skin necrosis: No Has patient had a PCN reaction that required hospitalization No Has patient had a PCN reaction occurring within the last 10 years: No If all of the above answers are "NO", then may proceed with Cephalosporin use.   . Decadron [Dexamethasone] Itching    Social History   Social History  . Marital status: Legally Separated    Spouse name: N/A  . Number of children: 3  . Years of education: N/A   Occupational History  . Not on file.   Social History Main Topics  .  Smoking status: Former Smoker    Packs/day: 1.50    Years: 8.00    Quit date: 03/15/2003  . Smokeless tobacco: Never Used  . Alcohol use No     Comment: none since 2004  . Drug use: No     Comment: former  none since 2004  . Sexual activity: Not on file   Other Topics Concern  . Not on file   Social History Narrative  . No narrative on file     Review of Systems: General: negative for chills, fever, night sweats or weight  changes.  Cardiovascular: negative for chest pain, dyspnea on exertion, edema, orthopnea, palpitations, paroxysmal nocturnal dyspnea or shortness of breath Dermatological: negative for rash Respiratory: negative for cough or wheezing Urologic: negative for hematuria Abdominal: negative for nausea, vomiting, diarrhea, bright red blood per rectum, melena, or hematemesis Neurologic: negative for visual changes, syncope, or dizziness All other systems reviewed and are otherwise negative except as noted above.    Blood pressure 139/85, pulse 92, height 5\' 6"  (1.676 m), weight 155 lb (70.3 kg), SpO2 97 %.  General appearance: alert and no distress Neck: no adenopathy, no carotid bruit, no JVD, supple, symmetrical, trachea midline and thyroid not enlarged, symmetric, no tenderness/mass/nodules Lungs: clear to auscultation bilaterally Heart: regular rate and rhythm, S1, S2 normal, no murmur, click, rub or gallop Extremities: extremities normal, atraumatic, no cyanosis or edema  EKG Not performed today  ASSESSMENT AND PLAN:   Essential hypertension History of essential hypertension blood pressure measured 139/85. He is on carvedilol, lisinopril and hydrochlorothiazide. . Current meds current At current dosing.  NICM (nonischemic cardiomyopathy) ([place]) History of nonischemic cardiomyopathy demonstrated by cardiac catheterization performed by Dr. Ellyn Hack 03/11/16. We have been optimizing his medical therapy. EF has improved by 2-D echo performed 03/06/17 up to the 40-45% range. He does have some mild dyspnea. I'm going to titrate his carvedilol from 12.5-18.75 and ultimately to 25 mg by mouth twice a day.      Lorretta Harp MD FACP,FACC,FAHA, [facility] 03/26/2017 3:02 PM

## 2017-04-24 ENCOUNTER — Ambulatory Visit: Payer: Medicaid Other

## 2017-04-24 NOTE — Progress Notes (Deleted)
Patient ID: Jonathan Dickson                 DOB: 1956-03-23                      MRN: [id number]     HPI: Jonathan Dickson is a 61 y.o. male referred by Dr. Gwenlyn Dickson to pharmacist clinic for beta-blocker titration. PMH includes history of nonischemic cardiomyopathy and hypertension. EF has improved by 2-D echo performed 03/06/17 up to the 40-45% range. Dr Jonathan Dickson titrated his carvedilol from 12.5mg  twice daily to 18.75mg  twice daily with ultimate goal of 25mg  twice daily.   Patient presents today to pharmacist clinic for final beta-blocker titration.    Current HTN meds:  Carvedilol 18.75mg  twice daily HCTZ 25mg  daily Lisinopril 40mg  daily  BP goal: 130/80  Social History: former smoker, denies alcohol and drug use  Diet:   Exercise:   Home BP readings:   Wt Readings from Last 3 Encounters:  03/26/17 155 lb (70.3 kg)  03/21/17 153 lb (69.4 kg)  02/20/17 157 lb (71.2 kg)   BP Readings from Last 3 Encounters:  03/26/17 139/85  03/21/17 114/73  02/20/17 110/80   Pulse Readings from Last 3 Encounters:  03/26/17 92  03/21/17 83  02/20/17 74    Renal function: CrCl cannot be calculated (Patient's most recent lab result is older than the maximum 21 days allowed.).  Past Medical History:  Diagnosis Date  . Abnormal TSH 02/2016  . Anxiety   . Cervical disc disease   . Chronic combined systolic and diastolic CHF (congestive heart failure) ([place])    a. 01/2016 Echo: EF 25%, inf AK, diffuse sev HK, Gr1 DD, mild MR.  . Depression   . Hyperglycemia   . Hypertension   . Hypertensive heart disease   . Myocardial infarction [facility])    ??  maybe  . NICM (nonischemic cardiomyopathy) ([place])    a. 01/2016 Echo: EF 25%, inf AK, diffuse sev HK, Gr1 DD, mild MR; b. 01/2016 MV: EF 22%, no isch/infarct;  c. 02/2016 Cath: Nl cors, EF 35-45%.  . Pre-diabetes   . Seizures ([place])    with penicillin    Current Outpatient Prescriptions on File Prior to Visit  Medication Sig Dispense Refill  . amitriptyline  (ELAVIL) 25 MG tablet Take 1 tablet (25 mg total) by mouth at bedtime as needed for sleep. 30 tablet 3  . carvedilol (COREG) 12.5 MG tablet Take 1.5 tablets (18.75 mg total) by mouth 2 (two) times daily. 90 tablet 6  . cholecalciferol (VITAMIN D) 1000 units tablet Take 1,000 Units by mouth daily.    . hydrochlorothiazide (HYDRODIURIL) 25 MG tablet Take 1 tablet (25 mg total) by mouth daily. 30 tablet 0  . lisinopril (PRINIVIL,ZESTRIL) 40 MG tablet Take 1 tablet (40 mg total) by mouth daily. Must have OV 30 tablet 0  . loratadine (CLARITIN) 10 MG tablet Take 1 tablet (10 mg total) by mouth daily. 30 tablet 3  . metFORMIN (GLUCOPHAGE XR) 500 MG 24 hr tablet Take 1 tablet (500 mg total) by mouth daily with breakfast. 90 tablet 3  . sildenafil (VIAGRA) 25 MG tablet Take 1 tablet (25 mg total) by mouth daily as needed for erectile dysfunction. 10 tablet 3  . traMADol-acetaminophen (ULTRACET) 37.5-325 MG tablet Take 1 tablet by mouth every 6 (six) hours as needed.     No current facility-administered medications on file prior to visit.     Allergies  Allergen Reactions  . Peanut-Containing Drug Products Swelling  . Penicillins Other (See Comments)    CONVULSIONS Has patient had a PCN reaction causing immediate rash, facial/tongue/throat swelling, SOB or lightheadedness with hypotension: No Has patient had a PCN reaction causing severe rash involving mucus membranes or skin necrosis: No Has patient had a PCN reaction that required hospitalization No Has patient had a PCN reaction occurring within the last 10 years: No If all of the above answers are "NO", then may proceed with Cephalosporin use.   . Decadron [Dexamethasone] Itching    There were no vitals taken for this visit.  No problem-specific Assessment & Plan notes Dickson for this encounter.  Berton Butrick Rodriguez-Guzman PharmD, [place] [id number] 04/24/2017 7:49 AM

## 2017-04-28 ENCOUNTER — Other Ambulatory Visit: Payer: Self-pay | Admitting: Internal Medicine

## 2017-04-28 DIAGNOSIS — I428 Other cardiomyopathies: Secondary | ICD-10-CM

## 2017-04-29 ENCOUNTER — Encounter: Payer: Self-pay | Admitting: Pharmacist

## 2017-04-29 ENCOUNTER — Ambulatory Visit (INDEPENDENT_AMBULATORY_CARE_PROVIDER_SITE_OTHER): Payer: Medicaid Other | Admitting: Pharmacist

## 2017-04-29 VITALS — BP 126/76 | HR 80

## 2017-04-29 DIAGNOSIS — I10 Essential (primary) hypertension: Secondary | ICD-10-CM

## 2017-04-29 DIAGNOSIS — I420 Dilated cardiomyopathy: Secondary | ICD-10-CM | POA: Diagnosis not present

## 2017-04-29 MED ORDER — CARVEDILOL 25 MG PO TABS: 25.0000 mg | ORAL_TABLET | Freq: Two times a day (BID) | ORAL | 5 refills | Status: DC

## 2017-04-29 NOTE — Assessment & Plan Note (Signed)
EF sowed significant improvement to 40-45% on most recent 2D-echo performed on 03/06/2017. Carvedilol titration to desired 25mg  twice daily will be possible today with BP of 126/76 and pulse of 80 bpm. Patient may take 2 tablets of 12.5mg  carvedilol twice daily until all gone, then start carvedilol 25mg  tablet twice daily. Will follow up with pharmacist clinic in 3 weeks and call clinic before appointment if any headaches, dizziness or fatigue develops prior to follow up.

## 2017-04-29 NOTE — Assessment & Plan Note (Signed)
Blood pressure remains controlled. No adverse reaction to current therapy noted. Will continue current therapy of lisinopril, HCTZ and carvedilol.

## 2017-04-29 NOTE — Patient Instructions (Addendum)
Return for a a follow up appointment in 3 weeks  Your blood pressure today is 126/76 pulse 80  Check your blood pressure at home daily (if able) and keep record of the readings.  Take your BP meds as follows: INCREASE carvedilol to 25mg  twice daily (okay to take 2 tablets of carvedilol 12.5mg  twice daily until all gone) Continue all other medication as prescribed   Bring your BP cuff and your record of home blood pressures to your next appointment.  Exercise as you're able, try to walk approximately 30 minutes per day.  Keep salt intake to a minimum, especially watch canned and prepared boxed foods.  Eat more fresh fruits and vegetables and fewer canned items.  Avoid eating in fast food restaurants.    HOW TO TAKE YOUR BLOOD PRESSURE: . Rest 5 minutes before taking your blood pressure. .  Don't smoke or drink caffeinated beverages for at least 30 minutes before. . Take your blood pressure before (not after) you eat. . Sit comfortably with your back supported and both feet on the floor (don't cross your legs). . Elevate your arm to heart level on a table or a desk. . Use the proper sized cuff. It should fit smoothly and snugly around your bare upper arm. There should be enough room to slip a fingertip under the cuff. The bottom edge of the cuff should be 1 inch above the crease of the elbow. . Ideally, take 3 measurements at one sitting and record the average.

## 2017-04-29 NOTE — Progress Notes (Signed)
Patient ID: Jonathan Dickson                 DOB: 04/27/1956                      MRN: [id number]     HPI: Jonathan Dickson is a 61 y.o. male referred by Dr. [name] to HTN clinic. PMH includes hypertension and nonischemic cardiomyopathy with most recent EF of 40-45%. Carvedilol titration to goal of 25mg  twice daily of maximum tolerated dose needed. During most recent office visit with Dr [name] his carvedilol was increased from 12.5ng twice daily to 18.75mg  twice daily.  Patient presents today to pharmacist clinic and denies dizziness, fatigue, chest pain or swelling. He dies not have BP monitor at home but plans to get one in 1-2 weeks.   Current HTN meds:  lisinopril 40mg  daily HCTZ 25mg  daily Carvedilol 18.75mg  twice daily  BP goal: <130/80  Social History: former tobacco user, denies alcohol intake  Diet: low sodium diet, coffee only in the morning  Home BP readings: none avialble  Wt Readings from Last 3 Encounters:  03/26/17 155 lb (70.3 kg)  03/21/17 153 lb (69.4 kg)  02/20/17 157 lb (71.2 kg)   BP Readings from Last 3 Encounters:  04/29/17 126/76  03/26/17 139/85  03/21/17 114/73   Pulse Readings from Last 3 Encounters:  04/29/17 80  03/26/17 92  03/21/17 83     Past Medical History:  Diagnosis Date  . Abnormal TSH 02/2016  . Anxiety   . Cervical disc disease   . Chronic combined systolic and diastolic CHF (congestive heart failure) ([place])    a. 01/2016 Echo: EF 25%, inf AK, diffuse sev HK, Gr1 DD, mild MR.  . Depression   . Hyperglycemia   . Hypertension   . Hypertensive heart disease   . Myocardial infarction [facility])    ??  maybe  . NICM (nonischemic cardiomyopathy) ([place])    a. 01/2016 Echo: EF 25%, inf AK, diffuse sev HK, Gr1 DD, mild MR; b. 01/2016 MV: EF 22%, no isch/infarct;  c. 02/2016 Cath: Nl cors, EF 35-45%.  . Pre-diabetes   . Seizures ([place])    with penicillin    Current Outpatient Prescriptions on File Prior to Visit  Medication Sig Dispense Refill  .  amitriptyline (ELAVIL) 25 MG tablet Take 1 tablet (25 mg total) by mouth at bedtime as needed for sleep. 30 tablet 3  . cholecalciferol (VITAMIN D) 1000 units tablet Take 1,000 Units by mouth daily.    . hydrochlorothiazide (HYDRODIURIL) 25 MG tablet Take 1 tablet (25 mg total) by mouth daily. 30 tablet 2  . lisinopril (PRINIVIL,ZESTRIL) 40 MG tablet Take 1 tablet (40 mg total) by mouth daily. Must have OV 30 tablet 0  . loratadine (CLARITIN) 10 MG tablet Take 1 tablet (10 mg total) by mouth daily. 30 tablet 3  . metFORMIN (GLUCOPHAGE XR) 500 MG 24 hr tablet Take 1 tablet (500 mg total) by mouth daily with breakfast. 90 tablet 3  . sildenafil (VIAGRA) 25 MG tablet Take 1 tablet (25 mg total) by mouth daily as needed for erectile dysfunction. 10 tablet 3  . traMADol-acetaminophen (ULTRACET) 37.5-325 MG tablet Take 1 tablet by mouth every 6 (six) hours as needed.     No current facility-administered medications on file prior to visit.     Allergies  Allergen Reactions  . Peanut-Containing Drug Products Swelling  . Penicillins Other (See Comments)    CONVULSIONS  Has patient had a PCN reaction causing immediate rash, facial/tongue/throat swelling, SOB or lightheadedness with hypotension: No Has patient had a PCN reaction causing severe rash involving mucus membranes or skin necrosis: No Has patient had a PCN reaction that required hospitalization No Has patient had a PCN reaction occurring within the last 10 years: No If all of the above answers are "NO", then may proceed with Cephalosporin use.   . Decadron [Dexamethasone] Itching    Blood pressure 126/76, pulse 80.  Essential hypertension Blood pressure remains controlled. No adverse reaction to current therapy noted. Will continue current therapy of lisinopril, HCTZ and carvedilol.  Congestive dilated cardiomyopathy ([place]) EF sowed significant improvement to 40-45% on most recent 2D-echo performed on 03/06/2017. Carvedilol titration to  desired 25mg  twice daily will be possible today with BP of 126/76 and pulse of 80 bpm. Patient may take 2 tablets of 12.5mg  carvedilol twice daily until all gone, then start carvedilol 25mg  tablet twice daily. Will follow up with pharmacist clinic in 3 weeks and call clinic before appointment if any headaches, dizziness or fatigue develops prior to follow up.   Jonathan Dickson PharmD, [place] [id number] 04/29/2017 8:26 PM

## 2017-05-12 ENCOUNTER — Encounter: Payer: Self-pay | Admitting: Internal Medicine

## 2017-05-12 ENCOUNTER — Ambulatory Visit: Payer: Medicaid Other | Attending: Internal Medicine | Admitting: Internal Medicine

## 2017-05-12 VITALS — BP 118/76 | HR 71 | Temp 98.0°F | Resp 16 | Wt 155.0 lb

## 2017-05-12 DIAGNOSIS — Z79899 Other long term (current) drug therapy: Secondary | ICD-10-CM | POA: Insufficient documentation

## 2017-05-12 DIAGNOSIS — G4700 Insomnia, unspecified: Secondary | ICD-10-CM | POA: Diagnosis not present

## 2017-05-12 DIAGNOSIS — Z880 Allergy status to penicillin: Secondary | ICD-10-CM | POA: Insufficient documentation

## 2017-05-12 DIAGNOSIS — Z7984 Long term (current) use of oral hypoglycemic drugs: Secondary | ICD-10-CM | POA: Insufficient documentation

## 2017-05-12 DIAGNOSIS — R7303 Prediabetes: Secondary | ICD-10-CM | POA: Insufficient documentation

## 2017-05-12 DIAGNOSIS — I420 Dilated cardiomyopathy: Secondary | ICD-10-CM | POA: Insufficient documentation

## 2017-05-12 DIAGNOSIS — I10 Essential (primary) hypertension: Secondary | ICD-10-CM

## 2017-05-12 DIAGNOSIS — I5042 Chronic combined systolic (congestive) and diastolic (congestive) heart failure: Secondary | ICD-10-CM | POA: Insufficient documentation

## 2017-05-12 DIAGNOSIS — I428 Other cardiomyopathies: Secondary | ICD-10-CM | POA: Insufficient documentation

## 2017-05-12 DIAGNOSIS — Z87891 Personal history of nicotine dependence: Secondary | ICD-10-CM | POA: Diagnosis not present

## 2017-05-12 DIAGNOSIS — I110 Hypertensive heart disease with heart failure: Secondary | ICD-10-CM | POA: Insufficient documentation

## 2017-05-12 LAB — GLUCOSE, POCT (MANUAL RESULT ENTRY): POC GLUCOSE: 116 mg/dL — AB (ref 70–99)

## 2017-05-12 MED ORDER — LISINOPRIL 40 MG PO TABS: 40.0000 mg | ORAL_TABLET | Freq: Every day | ORAL | 3 refills | Status: DC

## 2017-05-12 MED FILL — $VIAGRA 25 MG TABLET: 25 MG | 30 days supply | Qty: 10 | Fill #1

## 2017-05-12 NOTE — Progress Notes (Signed)
Patient ID: Jonathan Dickson, male    DOB: [birth year]  MRN: [id number]  CC: Medication Refill   Subjective: Jonathan Dickson is a 61 y.o. male who presents for UC visit. His concerns today include:  Pt with hx of NICM, HTN, preDM, insomnia.  Needing RF on Lisinopril. Took last one today. Told he had to be seen but was just seen by me in  June. -no complaints today  Patient Active Problem List   Diagnosis Date Noted  . Cervical disc disease with myelopathy 07/10/2016  . Chronic combined systolic and diastolic CHF (congestive heart failure) ([place])   . Acute on chronic combined systolic and diastolic congestive heart failure ([place]) 03/12/2016  . NICM (nonischemic cardiomyopathy) ([place]) 03/12/2016  . Abnormal TSH 03/12/2016  . Hyperglycemia 03/12/2016  . Chest pain 03/12/2016  . Hypertensive heart disease 03/12/2016  . Congestive dilated cardiomyopathy ([place]) 03/11/2016  . Left ventricular dysfunction 03/06/2016  . Cervical radiculitis 04/19/2015  . Essential hypertension 09/27/2014     Current Outpatient Prescriptions on File Prior to Visit  Medication Sig Dispense Refill  . amitriptyline (ELAVIL) 25 MG tablet Take 1 tablet (25 mg total) by mouth at bedtime as needed for sleep. 30 tablet 3  . carvedilol (COREG) 25 MG tablet Take 1 tablet (25 mg total) by mouth 2 (two) times daily. 60 tablet 5  . cholecalciferol (VITAMIN D) 1000 units tablet Take 1,000 Units by mouth daily.    . hydrochlorothiazide (HYDRODIURIL) 25 MG tablet Take 1 tablet (25 mg total) by mouth daily. 30 tablet 2  . loratadine (CLARITIN) 10 MG tablet Take 1 tablet (10 mg total) by mouth daily. 30 tablet 3  . metFORMIN (GLUCOPHAGE XR) 500 MG 24 hr tablet Take 1 tablet (500 mg total) by mouth daily with breakfast. 90 tablet 3  . sildenafil (VIAGRA) 25 MG tablet Take 1 tablet (25 mg total) by mouth daily as needed for erectile dysfunction. 10 tablet 3  . traMADol-acetaminophen (ULTRACET) 37.5-325 MG tablet Take 1 tablet by  mouth every 6 (six) hours as needed.     No current facility-administered medications on file prior to visit.     Allergies  Allergen Reactions  . Peanut-Containing Drug Products Swelling  . Penicillins Other (See Comments)    CONVULSIONS Has patient had a PCN reaction causing immediate rash, facial/tongue/throat swelling, SOB or lightheadedness with hypotension: No Has patient had a PCN reaction causing severe rash involving mucus membranes or skin necrosis: No Has patient had a PCN reaction that required hospitalization No Has patient had a PCN reaction occurring within the last 10 years: No If all of the above answers are "NO", then may proceed with Cephalosporin use.   . Decadron [Dexamethasone] Itching    Social History   Social History  . Marital status: Legally Separated    Spouse name: N/A  . Number of children: 3  . Years of education: N/A   Occupational History  . Not on file.   Social History Main Topics  . Smoking status: Former Smoker    Packs/day: 1.50    Years: 8.00    Quit date: 03/15/2003  . Smokeless tobacco: Never Used  . Alcohol use No     Comment: none since 2004  . Drug use: No     Comment: former  none since 2004  . Sexual activity: Not on file   Other Topics Concern  . Not on file   Social History Narrative  . No narrative on file  Family History  Problem Relation Age of Onset  . Diabetes Mother   . Diabetes Sister     Past Surgical History:  Procedure Laterality Date  . ANTERIOR CERVICAL DECOMP/DISCECTOMY FUSION N/A 07/10/2016   Procedure: ANTERIOR CERVICAL DECOMPRESSION FUSION CERVICAL 4-5, CERVICAL 5-6, CERVICAL 6-7 WITH INSTRUMENTATION AND ALLOGRAFT;  Surgeon: [name], MD;  Location: [facility];  Service: Orthopedics;  Laterality: N/A;  . CARDIAC CATHETERIZATION N/A 03/11/2016   Procedure: Left Heart Cath and Coronary Angiography;  Surgeon: [name], MD;  Location: [facility];  Service: Cardiovascular;   Laterality: N/A;  . Thumb surgery Right     ROS: Review of Systems Neg except as stated above  PHYSICAL EXAM: BP 118/76   Pulse 71   Temp 98 F (36.7 C) (Oral)   Resp 16   Wt 155 lb (70.3 kg)   SpO2 95%   BMI 25.02 kg/m   Physical Exam General appearance - alert, well appearing, and in no distress Mental status - alert, oriented to person, place, and time, normal mood, behavior, speech, dress, motor activity, and thought processes  Results for orders placed or performed in visit on 05/12/17  POCT glucose (manual entry)  Result Value Ref Range   POC Glucose 116 (A) 70 - 99 mg/dl    ASSESSMENT AND PLAN: There are no diagnoses linked to this encounter. 1. Essential hypertension controlled - lisinopril (PRINIVIL,ZESTRIL) 40 MG tablet; Take 1 tablet (40 mg total) by mouth daily. Must have OV  Dispense: 90 tablet; Refill: 3  2. Prediabetes - POCT glucose (manual entry)  Patient was given the opportunity to ask questions.  Patient verbalized understanding of the plan and was able to repeat key elements of the plan.   Orders Placed This Encounter  Procedures  . POCT glucose (manual entry)     Requested Prescriptions   Signed Prescriptions Disp Refills  . lisinopril (PRINIVIL,ZESTRIL) 40 MG tablet 90 tablet 3    Sig: Take 1 tablet (40 mg total) by mouth daily. Must have OV    Return in about 3 months (around 08/12/2017).  Karle Plumber, MD, FACP

## 2017-05-21 ENCOUNTER — Ambulatory Visit (INDEPENDENT_AMBULATORY_CARE_PROVIDER_SITE_OTHER): Payer: Medicaid Other | Admitting: Pharmacist

## 2017-05-21 VITALS — BP 116/64 | HR 70 | Wt 156.2 lb

## 2017-05-21 DIAGNOSIS — I420 Dilated cardiomyopathy: Secondary | ICD-10-CM | POA: Diagnosis not present

## 2017-05-21 NOTE — Progress Notes (Signed)
Patient ID: Jonathan Dickson                 DOB: 1956-04-05                      MRN: [id number]     HPI: Jonathan Dickson is a 61 y.o. male referred by Dr. [name] to HTN clinic. PMH includes hypertension and nonischemic cardiomyopathy with most recent EF of 40-45%. Carvedilol dose was increased to desired 25mg  twice daily during most recent office visit.  Patient presents today to pharmacist clinic and denies dizziness, fatigue, chest pain or swelling.  No BP cuff available at home and not able to monitor BO regularly.  Current HTN meds:  lisinopril 40mg  daily HCTZ 25mg  daily Carvedilol 25mg  twice daily  BP goal: <130/80  Social History: former tobacco user, denies alcohol intake  Diet: low sodium diet, coffee only in the morning  Home BP readings: none avialble  Wt Readings from Last 3 Encounters:  05/21/17 156 lb 3.2 oz (70.9 kg)  05/12/17 155 lb (70.3 kg)  03/26/17 155 lb (70.3 kg)   BP Readings from Last 3 Encounters:  05/21/17 116/64  05/12/17 118/76  04/29/17 126/76   Pulse Readings from Last 3 Encounters:  05/21/17 70  05/12/17 71  04/29/17 80    Past Medical History:  Diagnosis Date  . Abnormal TSH 02/2016  . Anxiety   . Cervical disc disease   . Chronic combined systolic and diastolic CHF (congestive heart failure) ([place])    a. 01/2016 Echo: EF 25%, inf AK, diffuse sev HK, Gr1 DD, mild MR.  . Depression   . Hyperglycemia   . Hypertension   . Hypertensive heart disease   . Myocardial infarction [facility])    ??  maybe  . NICM (nonischemic cardiomyopathy) ([place])    a. 01/2016 Echo: EF 25%, inf AK, diffuse sev HK, Gr1 DD, mild MR; b. 01/2016 MV: EF 22%, no isch/infarct;  c. 02/2016 Cath: Nl cors, EF 35-45%.  . Pre-diabetes   . Seizures ([place])    with penicillin    Current Outpatient Prescriptions on File Prior to Visit  Medication Sig Dispense Refill  . amitriptyline (ELAVIL) 25 MG tablet Take 1 tablet (25 mg total) by mouth at bedtime as needed for sleep. 30 tablet 3  .  carvedilol (COREG) 25 MG tablet Take 1 tablet (25 mg total) by mouth 2 (two) times daily. 60 tablet 5  . cholecalciferol (VITAMIN D) 1000 units tablet Take 1,000 Units by mouth daily.    . hydrochlorothiazide (HYDRODIURIL) 25 MG tablet Take 1 tablet (25 mg total) by mouth daily. 30 tablet 2  . lisinopril (PRINIVIL,ZESTRIL) 40 MG tablet Take 1 tablet (40 mg total) by mouth daily. Must have OV 90 tablet 3  . loratadine (CLARITIN) 10 MG tablet Take 1 tablet (10 mg total) by mouth daily. 30 tablet 3  . metFORMIN (GLUCOPHAGE XR) 500 MG 24 hr tablet Take 1 tablet (500 mg total) by mouth daily with breakfast. 90 tablet 3  . sildenafil (VIAGRA) 25 MG tablet Take 1 tablet (25 mg total) by mouth daily as needed for erectile dysfunction. 10 tablet 3  . traMADol-acetaminophen (ULTRACET) 37.5-325 MG tablet Take 1 tablet by mouth every 6 (six) hours as needed.     No current facility-administered medications on file prior to visit.     Allergies  Allergen Reactions  . Peanut-Containing Drug Products Swelling  . Penicillins Other (See Comments)  CONVULSIONS Has patient had a PCN reaction causing immediate rash, facial/tongue/throat swelling, SOB or lightheadedness with hypotension: No Has patient had a PCN reaction causing severe rash involving mucus membranes or skin necrosis: No Has patient had a PCN reaction that required hospitalization No Has patient had a PCN reaction occurring within the last 10 years: No If all of the above answers are "NO", then may proceed with Cephalosporin use.   . Decadron [Dexamethasone] Itching    Blood pressure 116/64, pulse 70, weight 156 lb 3.2 oz (70.9 kg).  Congestive dilated cardiomyopathy ([place]) Patient BB was titrated to Carvedilol 25mg  twice daily 3 weeks ago. Blood pressure remains stable without fatigue, dizziness or shortness or breath. Will continue current medication without changes and follow up as needed. Noted next ECHO already scheduled for December  /2018.   Anthone Prieur Rodriguez-Guzman PharmD, [place] [id number] 05/21/2017 3:30 PM

## 2017-05-21 NOTE — Patient Instructions (Addendum)
Return for a a follow up appointment as needed  Your blood pressure today is 116/64 pulse 70  Check your blood pressure at home daily (if able) and keep record of the readings.  Take your BP meds as follows: **ALL medication as prescribed**  Bring all of your meds, your BP cuff and your record of home blood pressures to your next appointment.  Exercise as you're able, try to walk approximately 30 minutes per day.  Keep salt intake to a minimum, especially watch canned and prepared boxed foods.  Eat more fresh fruits and vegetables and fewer canned items.  Avoid eating in fast food restaurants.    HOW TO TAKE YOUR BLOOD PRESSURE: . Rest 5 minutes before taking your blood pressure. .  Don't smoke or drink caffeinated beverages for at least 30 minutes before. . Take your blood pressure before (not after) you eat. . Sit comfortably with your back supported and both feet on the floor (don't cross your legs). . Elevate your arm to heart level on a table or a desk. . Use the proper sized cuff. It should fit smoothly and snugly around your bare upper arm. There should be enough room to slip a fingertip under the cuff. The bottom edge of the cuff should be 1 inch above the crease of the elbow. . Ideally, take 3 measurements at one sitting and record the average.

## 2017-05-21 NOTE — Assessment & Plan Note (Addendum)
Patient Jonathan Dickson was titrated to Carvedilol 25mg  twice daily 3 weeks ago. Blood pressure remains stable without fatigue, dizziness or shortness or breath. Will continue current medication without changes and follow up as needed. Noted next ECHO already scheduled for December /2018.

## 2017-07-31 MED FILL — $VIAGRA 25 MG TABLET: 25 MG | 30 days supply | Qty: 10 | Fill #2

## 2017-09-10 ENCOUNTER — Other Ambulatory Visit (HOSPITAL_COMMUNITY): Payer: Medicaid Other

## 2017-09-11 ENCOUNTER — Ambulatory Visit (HOSPITAL_COMMUNITY): Payer: Medicaid Other | Attending: Cardiovascular Disease

## 2017-09-11 ENCOUNTER — Other Ambulatory Visit: Payer: Self-pay

## 2017-09-11 DIAGNOSIS — I051 Rheumatic mitral insufficiency: Secondary | ICD-10-CM | POA: Diagnosis not present

## 2017-09-11 DIAGNOSIS — I519 Heart disease, unspecified: Secondary | ICD-10-CM | POA: Diagnosis present

## 2017-09-11 DIAGNOSIS — I5030 Unspecified diastolic (congestive) heart failure: Secondary | ICD-10-CM | POA: Diagnosis not present

## 2017-09-16 ENCOUNTER — Ambulatory Visit: Payer: Medicaid Other | Admitting: Cardiovascular Disease

## 2017-09-24 ENCOUNTER — Telehealth: Payer: Self-pay | Admitting: Internal Medicine

## 2017-09-24 DIAGNOSIS — I428 Other cardiomyopathies: Secondary | ICD-10-CM

## 2017-09-24 MED ORDER — HYDROCHLOROTHIAZIDE 25 MG PO TABS: 25.0000 mg | ORAL_TABLET | Freq: Every day | ORAL | 0 refills | Status: DC

## 2017-09-24 NOTE — Telephone Encounter (Signed)
Patient called requesting a refill on hydrochiazide

## 2017-09-24 NOTE — Telephone Encounter (Signed)
Patient called requesting hydrochiazide. Please send to [facility] on [address]. Please fu

## 2017-09-24 NOTE — Telephone Encounter (Signed)
Refilled x 30 days, patient needs office visit for refills.

## 2017-10-01 ENCOUNTER — Ambulatory Visit: Payer: Medicaid Other | Admitting: Cardiovascular Disease

## 2017-10-07 ENCOUNTER — Other Ambulatory Visit: Payer: Self-pay | Admitting: Cardiovascular Disease

## 2017-10-14 ENCOUNTER — Encounter: Payer: Self-pay | Admitting: Cardiovascular Disease

## 2017-10-14 ENCOUNTER — Ambulatory Visit: Payer: Medicaid Other | Admitting: Cardiovascular Disease

## 2017-10-14 DIAGNOSIS — I428 Other cardiomyopathies: Secondary | ICD-10-CM

## 2017-10-14 DIAGNOSIS — I10 Essential (primary) hypertension: Secondary | ICD-10-CM

## 2017-10-14 NOTE — Patient Instructions (Signed)
NO MED CHANGES  Your physician wants you to follow-up in: 1 year with Dr. [name]. You will receive a reminder letter in the mail two months in advance. If you don't receive a letter, please call our office to schedule the follow-up appointment.

## 2017-10-14 NOTE — Progress Notes (Signed)
10/14/2017 [name]   Aug 02, 1956  [id number]  Primary Physician Ladell Pier, MD Primary Cardiologist: Lorretta Harp MD [name], [place]  HPI:  Jonathan Dickson is a 62 y.o.  fit appearing widowed African-American male father of 36, grandfather to 3 grandchildren who isretired since I last saw him.. I last saw him in the office  03/26/17. He was scheduled to have cervical laminectomy today but this was put off because of need for cardiac clearance.His only risk factor is hypertension. Smoking 13 years ago. He's never had a heart attack or stroke. He denies chest pain or shortness of breath. I performed 2-D echocardiography which revealed unexpectedly ejection fraction of 25% with inferior wall akinesis and severe hypokinesia otherwise. A Myoview stress test confirmed the deep diminished ejection fraction but did not show any areas of ischemia or infarction. 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 He underwent right left heart cath by Dr. Ellyn Hack 03/11/16 confirming this. Ultimately, he is begun on carvedilol and lisinopril. His ejection fraction improved up to 35-40% by 2-D echocardiogram performed 06/21/16. He is completely symptomatic. Based on this I cleared him forfor hiselective cervical laminectomy and mildly elevated risk. . His 2-D echo performed 03/06/17 showed mild improvement in his LV function with an EF of 40-45%. His most recent echo performed 09/11/17 showed even more improvement with an EF of 50 at 55% with grade 1 diastolic dysfunction. Since I saw him in the office 6 months ago his remaining completely asymptomatic.     Current Meds  Medication Sig  . amitriptyline (ELAVIL) 25 MG tablet Take 1 tablet (25 mg total) by mouth at bedtime as needed for sleep.  . carvedilol (COREG) 25 MG tablet Take 1 tablet (25 mg total) by mouth 2 (two) times daily.  . cholecalciferol (VITAMIN D) 1000 units tablet Take 1,000 Units by mouth daily.  .  hydrochlorothiazide (HYDRODIURIL) 25 MG tablet Take 1 tablet (25 mg total) by mouth daily.  Marland Kitchen lisinopril (PRINIVIL,ZESTRIL) 40 MG tablet Take 1 tablet (40 mg total) by mouth daily. Must have OV  . loratadine (CLARITIN) 10 MG tablet Take 1 tablet (10 mg total) by mouth daily.  . metFORMIN (GLUCOPHAGE XR) 500 MG 24 hr tablet Take 1 tablet (500 mg total) by mouth daily with breakfast.  . sildenafil (VIAGRA) 25 MG tablet Take 1 tablet (25 mg total) by mouth daily as needed for erectile dysfunction.  . traMADol-acetaminophen (ULTRACET) 37.5-325 MG tablet Take 1 tablet by mouth every 6 (six) hours as needed.     Allergies  Allergen Reactions  . Peanut-Containing Drug Products Swelling  . Penicillins Other (See Comments)    CONVULSIONS Has patient had a PCN reaction causing immediate rash, facial/tongue/throat swelling, SOB or lightheadedness with hypotension: No Has patient had a PCN reaction causing severe rash involving mucus membranes or skin necrosis: No Has patient had a PCN reaction that required hospitalization No Has patient had a PCN reaction occurring within the last 10 years: No If all of the above answers are "NO", then may proceed with Cephalosporin use.   . Decadron [Dexamethasone] Itching    Social History   Socioeconomic History  . Marital status: Legally Separated    Spouse name: Not on file  . Number of children: 3  . Years of education: Not on file  . Highest education level: Not on file  Social Needs  . Financial resource strain: Not on file  . Food insecurity -  worry: Not on file  . Food insecurity - inability: Not on file  . Transportation needs - medical: Not on file  . Transportation needs - non-medical: Not on file  Occupational History  . Not on file  Tobacco Use  . Smoking status: Former Smoker    Packs/day: 1.50    Years: 8.00    Pack years: 12.00    Last attempt to quit: 03/15/2003    Years since quitting: 14.5  . Smokeless tobacco: Never Used    Substance and Sexual Activity  . Alcohol use: No    Comment: none since 2004  . Drug use: No    Comment: former  none since 2004  . Sexual activity: Not on file  Other Topics Concern  . Not on file  Social History Narrative  . Not on file     Review of Systems: General: negative for chills, fever, night sweats or weight changes.  Cardiovascular: negative for chest pain, dyspnea on exertion, edema, orthopnea, palpitations, paroxysmal nocturnal dyspnea or shortness of breath Dermatological: negative for rash Respiratory: negative for cough or wheezing Urologic: negative for hematuria Abdominal: negative for nausea, vomiting, diarrhea, bright red blood per rectum, melena, or hematemesis Neurologic: negative for visual changes, syncope, or dizziness All other systems reviewed and are otherwise negative except as noted above.    Blood pressure (!) 141/81, pulse 89, height 5\' 6"  (1.676 m), weight 170 lb (77.1 kg).  General appearance: alert and no distress Neck: no adenopathy, no carotid bruit, no JVD, supple, symmetrical, trachea midline and thyroid not enlarged, symmetric, no tenderness/mass/nodules Lungs: clear to auscultation bilaterally Heart: regular rate and rhythm, S1, S2 normal, no murmur, click, rub or gallop Extremities: extremities normal, atraumatic, no cyanosis or edema Pulses: 2+ and symmetric Skin: Skin color, texture, turgor normal. No rashes or lesions Neurologic: Alert and oriented X 3, normal strength and tone. Normal symmetric reflexes. Normal coordination and gait  EKG not performed today  ASSESSMENT AND PLAN:   Essential hypertension History of essential hypertension blood pressure measured at 141/81, slightly higher than it normally runs. He is on carvedilol, lisinopril and hydrochlorothiazide. Continue current meds at current dosing.  NICM (nonischemic cardiomyopathy) ([place]) History of nonischemic cardiomyopathy on optimal medical therapy with recent 2-D  echo performed 09/11/17 that showed marked improvement in LV function up to the 50-55% range with grade 1 diastolic dysfunction. He is completely symptomatic.      Lorretta Harp MD FACP,FACC,FAHA, [facility] 10/14/2017 10:29 AM

## 2017-10-14 NOTE — Assessment & Plan Note (Signed)
History of essential hypertension blood pressure measured at 141/81, slightly higher than it normally runs. He is on carvedilol, lisinopril and hydrochlorothiazide. Continue current meds at current dosing.

## 2017-10-14 NOTE — Assessment & Plan Note (Signed)
History of nonischemic cardiomyopathy on optimal medical therapy with recent 2-D echo performed 09/11/17 that showed marked improvement in LV function up to the 50-55% range with grade 1 diastolic dysfunction. He is completely symptomatic.

## 2017-10-24 ENCOUNTER — Other Ambulatory Visit: Payer: Self-pay | Admitting: Internal Medicine

## 2017-10-24 ENCOUNTER — Telehealth: Payer: Self-pay | Admitting: Internal Medicine

## 2017-10-24 DIAGNOSIS — I428 Other cardiomyopathies: Secondary | ICD-10-CM

## 2017-10-24 NOTE — Telephone Encounter (Signed)
Patient was given 1 refill in December and instructed to schedule appt. No appt scheduled. Refill will have to be approved by PCP. Will forward to Dr. Wynetta Emery.

## 2017-10-24 NOTE — Telephone Encounter (Signed)
PT called to get a refill for hydrochlorothiazide (HYDRODIURIL) 25 MG tablet  Please sent it to the follow [place] on [address],  Please follow up

## 2017-10-25 MED ORDER — HYDROCHLOROTHIAZIDE 25 MG PO TABS: 25.0000 mg | ORAL_TABLET | Freq: Every day | ORAL | 2 refills | Status: DC

## 2017-11-10 ENCOUNTER — Encounter: Payer: Self-pay | Admitting: Internal Medicine

## 2017-11-10 ENCOUNTER — Ambulatory Visit: Payer: Medicaid Other | Attending: Internal Medicine | Admitting: Internal Medicine

## 2017-11-10 VITALS — BP 131/84 | HR 76 | Temp 98.0°F | Ht 66.0 in | Wt 171.2 lb

## 2017-11-10 DIAGNOSIS — I420 Dilated cardiomyopathy: Secondary | ICD-10-CM | POA: Insufficient documentation

## 2017-11-10 DIAGNOSIS — R739 Hyperglycemia, unspecified: Secondary | ICD-10-CM | POA: Diagnosis not present

## 2017-11-10 DIAGNOSIS — Z79899 Other long term (current) drug therapy: Secondary | ICD-10-CM | POA: Diagnosis not present

## 2017-11-10 DIAGNOSIS — Z833 Family history of diabetes mellitus: Secondary | ICD-10-CM | POA: Diagnosis not present

## 2017-11-10 DIAGNOSIS — Z880 Allergy status to penicillin: Secondary | ICD-10-CM | POA: Diagnosis not present

## 2017-11-10 DIAGNOSIS — Z7984 Long term (current) use of oral hypoglycemic drugs: Secondary | ICD-10-CM | POA: Diagnosis not present

## 2017-11-10 DIAGNOSIS — M5412 Radiculopathy, cervical region: Secondary | ICD-10-CM | POA: Diagnosis not present

## 2017-11-10 DIAGNOSIS — Z91010 Allergy to peanuts: Secondary | ICD-10-CM | POA: Insufficient documentation

## 2017-11-10 DIAGNOSIS — Z888 Allergy status to other drugs, medicaments and biological substances status: Secondary | ICD-10-CM | POA: Diagnosis not present

## 2017-11-10 DIAGNOSIS — I5042 Chronic combined systolic (congestive) and diastolic (congestive) heart failure: Secondary | ICD-10-CM | POA: Diagnosis not present

## 2017-11-10 DIAGNOSIS — Z79891 Long term (current) use of opiate analgesic: Secondary | ICD-10-CM | POA: Diagnosis not present

## 2017-11-10 DIAGNOSIS — I10 Essential (primary) hypertension: Secondary | ICD-10-CM | POA: Diagnosis not present

## 2017-11-10 DIAGNOSIS — Z9889 Other specified postprocedural states: Secondary | ICD-10-CM | POA: Diagnosis not present

## 2017-11-10 DIAGNOSIS — G4700 Insomnia, unspecified: Secondary | ICD-10-CM | POA: Diagnosis not present

## 2017-11-10 DIAGNOSIS — R079 Chest pain, unspecified: Secondary | ICD-10-CM | POA: Diagnosis not present

## 2017-11-10 DIAGNOSIS — R7303 Prediabetes: Secondary | ICD-10-CM

## 2017-11-10 DIAGNOSIS — I110 Hypertensive heart disease with heart failure: Secondary | ICD-10-CM | POA: Diagnosis present

## 2017-11-10 DIAGNOSIS — N529 Male erectile dysfunction, unspecified: Secondary | ICD-10-CM | POA: Diagnosis not present

## 2017-11-10 DIAGNOSIS — Z87891 Personal history of nicotine dependence: Secondary | ICD-10-CM | POA: Insufficient documentation

## 2017-11-10 DIAGNOSIS — R946 Abnormal results of thyroid function studies: Secondary | ICD-10-CM | POA: Diagnosis not present

## 2017-11-10 DIAGNOSIS — H9193 Unspecified hearing loss, bilateral: Secondary | ICD-10-CM | POA: Diagnosis not present

## 2017-11-10 LAB — GLUCOSE, POCT (MANUAL RESULT ENTRY): POC GLUCOSE: 126 mg/dL — AB (ref 70–99)

## 2017-11-10 MED ORDER — SILDENAFIL CITRATE 25 MG PO TABS: 25.0000 mg | ORAL_TABLET | Freq: Every day | ORAL | 6 refills | Status: DC | PRN

## 2017-11-10 MED FILL — !VIAGRA 25 MG TABLET: 25 | 30 days supply | Qty: 10 | Fill #0

## 2017-11-10 NOTE — Progress Notes (Signed)
Patient ID: Jonathan Dickson, male    DOB: [birth year]  MRN: [id number]  CC: Hypertension; Hearing Problem; and Medication Refill (viagra)   Subjective: Jonathan Dickson is a 62 y.o. male who presents for chronic ds management.  Last seen 04/2017 His concerns today include:  Pt with hx of NICM, HTN, preDM, insomnia.  1.  Received form from [employer] for patient to get telephone with caption service for better communication during phone calls.I have no documentation on the chart of patient having a hearing issue, therefore I requested he be seen before I could complete form.   -he c/o having problems with hearing for several mhs and getting worse.  Both sides. Having to cut volume up on phone and TV.  Use to play in [employer] and [employer] when growing up -worse in crowds when a lot of conversations going on in back ground Never saw on ENT for this Has [employer] Telephone x few wks.  Finds it helpful  2.  HTN: compliant with meds and salt restriction -no CP.  +SOB whe he walks long distances Working out 4 x a wk at the gym  3. ED:  Request RF on Viagra  Patient Active Problem List   Diagnosis Date Noted  . Cervical disc disease with myelopathy 07/10/2016  . Chronic combined systolic and diastolic CHF (congestive heart failure) ([place])   . Acute on chronic combined systolic and diastolic congestive heart failure ([place]) 03/12/2016  . NICM (nonischemic cardiomyopathy) ([place]) 03/12/2016  . Abnormal TSH 03/12/2016  . Hyperglycemia 03/12/2016  . Chest pain 03/12/2016  . Hypertensive heart disease 03/12/2016  . Congestive dilated cardiomyopathy ([place]) 03/11/2016  . Left ventricular dysfunction 03/06/2016  . Cervical radiculitis 04/19/2015  . Essential hypertension 09/27/2014     Current Outpatient Medications on File Prior to Visit  Medication Sig Dispense Refill  . amitriptyline (ELAVIL) 25 MG tablet Take 1 tablet (25 mg total) by mouth at bedtime as needed for sleep. 30 tablet 3  .  carvedilol (COREG) 25 MG tablet Take 1 tablet (25 mg total) by mouth 2 (two) times daily. 60 tablet 0  . cholecalciferol (VITAMIN D) 1000 units tablet Take 1,000 Units by mouth daily.    . hydrochlorothiazide (HYDRODIURIL) 25 MG tablet Take 1 tablet (25 mg total) by mouth daily. *emergency supply* 30 tablet 0  . lisinopril (PRINIVIL,ZESTRIL) 40 MG tablet Take 1 tablet (40 mg total) by mouth daily. Must have OV 90 tablet 3  . loratadine (CLARITIN) 10 MG tablet Take 1 tablet (10 mg total) by mouth daily. 30 tablet 3  . metFORMIN (GLUCOPHAGE XR) 500 MG 24 hr tablet Take 1 tablet (500 mg total) by mouth daily with breakfast. 90 tablet 3  . traMADol-acetaminophen (ULTRACET) 37.5-325 MG tablet Take 1 tablet by mouth every 6 (six) hours as needed.     No current facility-administered medications on file prior to visit.     Allergies  Allergen Reactions  . Peanut-Containing Drug Products Swelling  . Penicillins Other (See Comments)    CONVULSIONS Has patient had a PCN reaction causing immediate rash, facial/tongue/throat swelling, SOB or lightheadedness with hypotension: No Has patient had a PCN reaction causing severe rash involving mucus membranes or skin necrosis: No Has patient had a PCN reaction that required hospitalization No Has patient had a PCN reaction occurring within the last 10 years: No If all of the above answers are "NO", then may proceed with Cephalosporin use.   . Decadron [Dexamethasone] Itching  Social History   Socioeconomic History  . Marital status: Legally Separated    Spouse name: Not on file  . Number of children: 3  . Years of education: Not on file  . Highest education level: Not on file  Social Needs  . Financial resource strain: Not on file  . Food insecurity - worry: Not on file  . Food insecurity - inability: Not on file  . Transportation needs - medical: Not on file  . Transportation needs - non-medical: Not on file  Occupational History  . Not on  file  Tobacco Use  . Smoking status: Former Smoker    Packs/day: 1.50    Years: 8.00    Pack years: 12.00    Last attempt to quit: 03/15/2003    Years since quitting: 14.6  . Smokeless tobacco: Never Used  Substance and Sexual Activity  . Alcohol use: No    Comment: none since 2004  . Drug use: No    Comment: former  none since 2004  . Sexual activity: Not on file  Other Topics Concern  . Not on file  Social History Narrative  . Not on file    Family History  Problem Relation Age of Onset  . Diabetes Mother   . Diabetes Sister     Past Surgical History:  Procedure Laterality Date  . ANTERIOR CERVICAL DECOMP/DISCECTOMY FUSION N/A 07/10/2016   Procedure: ANTERIOR CERVICAL DECOMPRESSION FUSION CERVICAL 4-5, CERVICAL 5-6, CERVICAL 6-7 WITH INSTRUMENTATION AND ALLOGRAFT;  Surgeon: [name], MD;  Location: [facility];  Service: Orthopedics;  Laterality: N/A;  . CARDIAC CATHETERIZATION N/A 03/11/2016   Procedure: Left Heart Cath and Coronary Angiography;  Surgeon: [name], MD;  Location: [facility];  Service: Cardiovascular;  Laterality: N/A;  . Thumb surgery Right     ROS: Review of Systems As above  PHYSICAL EXAM: BP 131/84 (BP Location: Right Arm, Patient Position: Sitting, Cuff Size: Normal)   Pulse 76   Temp 98 F (36.7 C) (Oral)   Ht 5\' 6"  (1.676 m)   Wt 171 lb 3.2 oz (77.7 kg)   SpO2 99%   BMI 27.63 kg/m   Physical Exam  General appearance - alert, well appearing, and in no distress Mental status - alert, oriented to person, place, and time, normal mood, behavior, speech, dress, motor activity, and thought processes Ears - bilateral TM's and external ear canals normal Chest - clear to auscultation, no wheezes, rales or rhonchi, symmetric air entry Heart - normal rate, regular rhythm, normal S1, S2, no murmurs, rubs, clicks or gallops Extremities - peripheral pulses normal, no pedal edema, no clubbing or cyanosis  Results for orders placed or  performed in visit on 11/10/17  Glucose (CBG)  Result Value Ref Range   POC Glucose 126 (A) 70 - 99 mg/dl   Lab Results  Component Value Date   HGBA1C 6.4 02/19/2017    ASSESSMENT AND PLAN: 1. Decreased hearing of both ears I recommend referral to ENT for formal hearing assessment.  Form will be completed - Ambulatory referral to ENT  2. Essential hypertension -at goal  3. Erectile dysfunction, unspecified erectile dysfunction type - sildenafil (VIAGRA) 25 MG tablet; Take 1 tablet (25 mg total) by mouth daily as needed for erectile dysfunction.  Dispense: 10 tablet; Refill: 6  4. Prediabetes Doing well with exercise and eating habits  5. Hyperglycemia - Glucose (CBG)  Patient was given the opportunity to ask questions.  Patient verbalized understanding of the  plan and was able to repeat key elements of the plan.   Orders Placed This Encounter  Procedures  . Ambulatory referral to ENT  . Glucose (CBG)     Requested Prescriptions   Signed Prescriptions Disp Refills  . sildenafil (VIAGRA) 25 MG tablet 10 tablet 6    Sig: Take 1 tablet (25 mg total) by mouth daily as needed for erectile dysfunction.    F/u in 4 mths  Karle Plumber, MD, [facility] Chessman

## 2017-11-13 ENCOUNTER — Ambulatory Visit: Payer: Self-pay | Attending: Internal Medicine

## 2017-11-25 ENCOUNTER — Telehealth: Payer: Self-pay | Admitting: Internal Medicine

## 2017-11-25 DIAGNOSIS — H903 Sensorineural hearing loss, bilateral: Secondary | ICD-10-CM | POA: Insufficient documentation

## 2017-11-25 DIAGNOSIS — H905 Unspecified sensorineural hearing loss: Secondary | ICD-10-CM

## 2017-11-25 HISTORY — DX: Unspecified sensorineural hearing loss: H90.5

## 2017-11-25 NOTE — Telephone Encounter (Signed)
4 page, paperwork received. Please fu

## 2017-12-05 MED FILL — !VIAGRA 25 MG TABLET: 25 | 30 days supply | Qty: 10 | Fill #1

## 2017-12-10 ENCOUNTER — Other Ambulatory Visit: Payer: Self-pay | Admitting: Cardiovascular Disease

## 2017-12-22 ENCOUNTER — Other Ambulatory Visit: Payer: Self-pay | Admitting: Internal Medicine

## 2017-12-22 DIAGNOSIS — I428 Other cardiomyopathies: Secondary | ICD-10-CM

## 2017-12-24 ENCOUNTER — Ambulatory Visit: Payer: Medicaid Other | Attending: Internal Medicine | Admitting: Physician Assistant

## 2017-12-24 ENCOUNTER — Other Ambulatory Visit: Payer: Self-pay

## 2017-12-24 ENCOUNTER — Other Ambulatory Visit: Payer: Self-pay | Admitting: Pharmacist

## 2017-12-24 VITALS — BP 126/86 | HR 84 | Temp 97.4°F | Resp 16 | Wt 169.6 lb

## 2017-12-24 DIAGNOSIS — I110 Hypertensive heart disease with heart failure: Secondary | ICD-10-CM | POA: Diagnosis not present

## 2017-12-24 DIAGNOSIS — E1165 Type 2 diabetes mellitus with hyperglycemia: Secondary | ICD-10-CM | POA: Diagnosis not present

## 2017-12-24 DIAGNOSIS — F329 Major depressive disorder, single episode, unspecified: Secondary | ICD-10-CM | POA: Diagnosis not present

## 2017-12-24 DIAGNOSIS — Z79899 Other long term (current) drug therapy: Secondary | ICD-10-CM | POA: Insufficient documentation

## 2017-12-24 DIAGNOSIS — I10 Essential (primary) hypertension: Secondary | ICD-10-CM

## 2017-12-24 DIAGNOSIS — Z880 Allergy status to penicillin: Secondary | ICD-10-CM | POA: Diagnosis not present

## 2017-12-24 DIAGNOSIS — F419 Anxiety disorder, unspecified: Secondary | ICD-10-CM | POA: Insufficient documentation

## 2017-12-24 DIAGNOSIS — R0609 Other forms of dyspnea: Secondary | ICD-10-CM | POA: Diagnosis not present

## 2017-12-24 DIAGNOSIS — I5043 Acute on chronic combined systolic (congestive) and diastolic (congestive) heart failure: Secondary | ICD-10-CM | POA: Insufficient documentation

## 2017-12-24 DIAGNOSIS — Z7984 Long term (current) use of oral hypoglycemic drugs: Secondary | ICD-10-CM | POA: Insufficient documentation

## 2017-12-24 LAB — GLUCOSE, POCT (MANUAL RESULT ENTRY): POC GLUCOSE: 84 mg/dL (ref 70–99)

## 2017-12-24 MED ORDER — METFORMIN HCL ER 500 MG PO TB24: 500.0000 mg | ORAL_TABLET | Freq: Every day | ORAL | 0 refills | Status: DC

## 2017-12-24 NOTE — Progress Notes (Signed)
Patient ID: Jonathan Dickson, male   DOB: [birth year], 62 y.o.   MRN: [id number]   Jonathan Dickson, is a 62 y.o. male  LKG:[id number]  GUY:[id number]  DOB - 1955/12/28  Subjective:  Chief Complaint and HPI: Jonathan Dickson is a 61 y.o. male here today with increasing DOE.  He usu can walk to [employer] from his house ok, then gets SOB on the way back(I saw him for the same 02/19/2017).  He is concerned bc last week he was having sex and became very SOB twd the end then felt light-headed immediately afterwards.  He denies CP.  He occasionally gets SOB when he bends over too quickly.  He saw cardiology <11months ago and they told him to f/up in 1 year.  He is compliant with meds.  Denies orthopnea.  No diaphoresis.  He thinks he may have asthma.  He has never had asthma since being a child.  He feels that he is wheezing at times.  He feels fine today and is asymptomatic.      ROS:   Constitutional:  No f/c, No night sweats, No unexplained weight loss. EENT:  No vision changes, No blurry vision, No hearing changes. No mouth, throat, or ear problems.  Respiratory: No cough, + SOB Cardiac: No CP, no palpitations GI:  No abd pain, No N/V/D. GU: No Urinary s/sx Musculoskeletal: No joint pain Neuro: No headache, no dizziness, no motor weakness.  Skin: No rash Endocrine:  No polydipsia. No polyuria.  Psych: Denies SI/HI  No problems updated.  ALLERGIES: Allergies  Allergen Reactions  . Peanut-Containing Drug Products Swelling  . Penicillins Other (See Comments)    CONVULSIONS Has patient had a PCN reaction causing immediate rash, facial/tongue/throat swelling, SOB or lightheadedness with hypotension: No Has patient had a PCN reaction causing severe rash involving mucus membranes or skin necrosis: No Has patient had a PCN reaction that required hospitalization No Has patient had a PCN reaction occurring within the last 10 years: No If all of the above answers are "NO", then may proceed with Cephalosporin  use.   . Decadron [Dexamethasone] Itching    PAST MEDICAL HISTORY: Past Medical History:  Diagnosis Date  . Abnormal TSH 02/2016  . Anxiety   . Cervical disc disease   . Chronic combined systolic and diastolic CHF (congestive heart failure) ([place])    a. 01/2016 Echo: EF 25%, inf AK, diffuse sev HK, Gr1 DD, mild MR.  . Depression   . Hyperglycemia   . Hypertension   . Hypertensive heart disease   . Myocardial infarction [facility])    ??  maybe  . NICM (nonischemic cardiomyopathy) ([name])    a. 01/2016 Echo: EF 25%, inf AK, diffuse sev HK, Gr1 DD, mild MR; b. 01/2016 MV: EF 22%, no isch/infarct;  c. 02/2016 Cath: Nl cors, EF 35-45%.  . Pre-diabetes   . Seizures ([place])    with penicillin    MEDICATIONS AT HOME: Prior to Admission medications   Medication Sig Start Date End Date Taking? Authorizing Provider  amitriptyline (ELAVIL) 25 MG tablet Take 1 tablet (25 mg total) by mouth at bedtime as needed for sleep. 11/26/16   Maren Reamer, MD  carvedilol (COREG) 25 MG tablet Take 1 tablet (25 mg total) by mouth 2 (two) times daily. 12/10/17   Lorretta Harp, MD  cholecalciferol (VITAMIN D) 1000 units tablet Take 1,000 Units by mouth daily.    [provider]  hydrochlorothiazide (HYDRODIURIL) 25 MG tablet Take 1 tablet (  25 mg total) by mouth daily. *emergency supply* 10/25/17   Ladell Pier, MD  lisinopril (PRINIVIL,ZESTRIL) 40 MG tablet Take 1 tablet (40 mg total) by mouth daily. Must have OV 05/12/17   Ladell Pier, MD  loratadine (CLARITIN) 10 MG tablet Take 1 tablet (10 mg total) by mouth daily. 03/21/17   Ladell Pier, MD  metFORMIN (GLUCOPHAGE XR) 500 MG 24 hr tablet Take 1 tablet (500 mg total) by mouth daily with breakfast. 11/26/16   Maren Reamer, MD  sildenafil (VIAGRA) 25 MG tablet Take 1 tablet (25 mg total) by mouth daily as needed for erectile dysfunction. 11/10/17   Ladell Pier, MD  traMADol-acetaminophen (ULTRACET) 37.5-325 MG tablet Take 1  tablet by mouth every 6 (six) hours as needed.    [provider]     Objective:  EXAM:   Vitals:   12/24/17 0959  BP: 126/86  Pulse: 84  Resp: 16  Temp: (!) 97.4 F (36.3 C)  TempSrc: Oral  SpO2: 97%  Weight: 169 lb 9.6 oz (76.9 kg)    General appearance : A&OX3. NAD. Non-toxic-appearing HEENT: Atraumatic and Normocephalic.  PERRLA. EOM intact.  TM clear B. Mouth-MMM, post pharynx WNL w/o erythema, No PND. Neck: supple, no JVD. No cervical lymphadenopathy. No thyromegaly Chest/Lungs:  Breathing-non-labored, Good air entry bilaterally, breath sounds normal without rales, rhonchi, or wheezing  CVS: S1 S2 regular, no murmurs, gallops, rubs  Extremities: Bilateral Lower Ext shows no edema, both legs are warm to touch with = pulse throughout Neurology:  CN II-XII grossly intact, Non focal.   Psych:  TP linear. J/I WNL. Normal speech. Appropriate eye contact and affect.  Skin:  No Rash  Data Review Lab Results  Component Value Date   HGBA1C 6.4 02/19/2017   HGBA1C 6.2 (H) 07/04/2016   HGBA1C 6.1 (H) 03/21/2016     Assessment & Plan   1. DOE (dyspnea on exertion) Make appt with cardiology.  If any s/sx/worsening/call 911.  No acute issues today.  I do not think he has asthma.  I would be more concerned that this could be progression of his underlying cardiology issues.  He agrees to call cardiology for appointment and does not need referral.  - Brain natriuretic peptide - EKG 12-Lead-EKG with NSR and no ST changes - Comprehensive metabolic panel  2. Type 2 diabetes mellitus with hyperglycemia, without long-term current use of insulin (HCC) Blood sugar ok today-continue curent regimen - Glucose (CBG)  3. Essential hypertension Controlled-continue current regimen  4. Acute on chronic combined systolic and diastolic congestive heart failure [facility]) To cardiology.    Patient have been counseled extensively about nutrition and exercise  Return in about 1 month  (around 01/24/2018) for Dr Wynetta Emery; check DM/A1C.  The patient was given clear instructions to go to ER or return to medical center if symptoms don't improve, worsen or new problems develop. The patient verbalized understanding. The patient was told to call to get lab results if they haven't heard anything in the next week.     Freeman Caldron, PA-C [facility] [place], [place]   12/24/2017, 10:34 AM

## 2017-12-25 ENCOUNTER — Telehealth: Payer: Self-pay

## 2017-12-25 ENCOUNTER — Telehealth: Payer: Self-pay | Admitting: Internal Medicine

## 2017-12-25 LAB — COMPREHENSIVE METABOLIC PANEL
ALT: 20 IU/L (ref 0–44)
AST: 22 IU/L (ref 0–40)
Albumin/Globulin Ratio: 1.6 (ref 1.2–2.2)
Albumin: 4.5 g/dL (ref 3.6–4.8)
Alkaline Phosphatase: 77 IU/L (ref 39–117)
BILIRUBIN TOTAL: 0.4 mg/dL (ref 0.0–1.2)
BUN/Creatinine Ratio: 15 (ref 10–24)
BUN: 24 mg/dL (ref 8–27)
CHLORIDE: 100 mmol/L (ref 96–106)
CO2: 18 mmol/L — ABNORMAL LOW (ref 20–29)
Calcium: 10 mg/dL (ref 8.6–10.2)
Creatinine, Ser: 1.57 mg/dL — ABNORMAL HIGH (ref 0.76–1.27)
GFR calc non Af Amer: 47 mL/min/{1.73_m2} — ABNORMAL LOW (ref >59)
GFR, EST AFRICAN AMERICAN: 54 mL/min/{1.73_m2} — AB (ref >59)
GLUCOSE: 79 mg/dL (ref 65–99)
Globulin, Total: 2.9 g/dL (ref 1.5–4.5)
Potassium: 4.1 mmol/L (ref 3.5–5.2)
Sodium: 138 mmol/L (ref 134–144)
TOTAL PROTEIN: 7.4 g/dL (ref 6.0–8.5)

## 2017-12-25 LAB — BRAIN NATRIURETIC PEPTIDE: BNP: 6.9 pg/mL (ref 0.0–100.0)

## 2017-12-25 NOTE — Telephone Encounter (Signed)
Contacted pt to go over lab results pt didn't answer lvm asking pt to give me a call at his earliest convenience to go over results  If pt calls back please give results: labs overall look good. Your kidney function is a little impaired. You should make sure you are drinking 6-8 cups of water daily to stay hydrated and keep your kidneys functioning properly. Make appt with cardiology as discussed.

## 2017-12-25 NOTE — Telephone Encounter (Signed)
Pt called back to receive results,verified DOB and was given what nurse left on file. He had no further questions

## 2017-12-26 ENCOUNTER — Telehealth: Payer: Self-pay | Admitting: Cardiovascular Disease

## 2017-12-26 NOTE — Telephone Encounter (Signed)
Pt c/o Shortness Of Breath: STAT if SOB developed within the last 24 hours or pt is noticeably SOB on the phone  1. Are you currently SOB (can you hear that pt is SOB on the phone)? No   2. How long have you been experiencing SOB? 6-8 months   3. Are you SOB when sitting or when up moving around? Moving around  4. Are you currently experiencing any other symptoms? no

## 2017-12-26 NOTE — Telephone Encounter (Signed)
Returned call to states he has had worsening SOB with exertion over the last 2 months.  States with any exertion he becomes SOB.   States he went to PCP this week and they thought it was cardiac related and requested he see Dr. [name].  Denies swelling or weight gain, denies other symptoms.   No distress noted on phone.  Reviewed medications and is taking as prescribed.  Had labs at PCP-BNP WNL, hx : ICM   Patient is scheduled to see Dr. [name] 4/3-aware if symptoms progress or worsen to proceed to ER for evaluation.

## 2017-12-31 ENCOUNTER — Encounter: Payer: Self-pay | Admitting: Cardiovascular Disease

## 2017-12-31 ENCOUNTER — Ambulatory Visit: Payer: Medicaid Other | Admitting: Cardiovascular Disease

## 2017-12-31 VITALS — BP 130/78 | HR 80 | Ht 66.0 in | Wt 167.2 lb

## 2017-12-31 DIAGNOSIS — I428 Other cardiomyopathies: Secondary | ICD-10-CM | POA: Diagnosis not present

## 2017-12-31 DIAGNOSIS — R0602 Shortness of breath: Secondary | ICD-10-CM | POA: Diagnosis not present

## 2017-12-31 DIAGNOSIS — I739 Peripheral vascular disease, unspecified: Secondary | ICD-10-CM

## 2017-12-31 NOTE — Assessment & Plan Note (Signed)
History of nonischemic cardiomyopathy with cardiac catheterization performed by Dr. Ellyn Hack 03/11/16 confirming this.His ejection fraction initially was 25% which rose to normal on medical therapy. Recently he's noticed increasing shortness of breath on exertion. We will recheck a 2-D echocardiogram.

## 2017-12-31 NOTE — Progress Notes (Signed)
12/31/2017 [name]   1955-12-29  [id number]  Primary Physician Ladell Pier, MD Primary Cardiologist: Lorretta Harp MD [name], [place]  HPI:  Jonathan Dickson is a 62 y.o.  fit appearing widowed African-American male father of 68, grandfather to 3 grandchildren who isretired since I last saw him.. I last saw him in the office 10/14/17. He was scheduled to have cervical laminectomy today but this was put off because of need for cardiac clearance.His only risk factor is hypertension. Smoking 13 years ago. He's never had a heart attack or stroke. He denies chest pain or shortness of breath. I performed 2-D echocardiography which revealed unexpectedly ejection fraction of 25% with inferior wall akinesis and severe hypokinesia otherwise. A Myoview stress test confirmed the deep diminished ejection fraction but did not show any areas of ischemia or infarction. 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 He underwent right left heart cath by Dr. Ellyn Hack 03/11/16 confirming this. Ultimately, he is begun on carvedilol and lisinopril. His ejection fraction improved up to 35-40% by 2-D echocardiogram performed 06/21/16. He is completely symptomatic. Based on this I cleared him forfor hiselective cervical laminectomy and mildly elevated risk.. His 2-D echo performed 03/06/17 showed mild improvement in his LV function with an EF of 40-45%. His most recent echo performed 09/11/17 showed even more improvement with an EF of 50 at 55% with grade 1 diastolic dysfunction.  Since I saw him in the office 4 months ago he's noticed increasing dyspnea on exertion and bilateral calf pain with ambulation..     Current Meds  Medication Sig  . amitriptyline (ELAVIL) 25 MG tablet Take 1 tablet (25 mg total) by mouth at bedtime as needed for sleep.  . carvedilol (COREG) 25 MG tablet Take 1 tablet (25 mg total) by mouth 2 (two) times daily.  . cholecalciferol (VITAMIN D) 1000 units tablet Take  1,000 Units by mouth daily.  . hydrochlorothiazide (HYDRODIURIL) 25 MG tablet Take 1 tablet (25 mg total) by mouth daily. *emergency supply*  . lisinopril (PRINIVIL,ZESTRIL) 40 MG tablet Take 1 tablet (40 mg total) by mouth daily. Must have OV  . loratadine (CLARITIN) 10 MG tablet Take 1 tablet (10 mg total) by mouth daily.  . metFORMIN (GLUCOPHAGE XR) 500 MG 24 hr tablet Take 1 tablet (500 mg total) by mouth daily with breakfast.  . sildenafil (VIAGRA) 25 MG tablet Take 1 tablet (25 mg total) by mouth daily as needed for erectile dysfunction.  . traMADol-acetaminophen (ULTRACET) 37.5-325 MG tablet Take 1 tablet by mouth every 6 (six) hours as needed.     Allergies  Allergen Reactions  . Peanut-Containing Drug Products Swelling  . Penicillins Other (See Comments)    CONVULSIONS Has patient had a PCN reaction causing immediate rash, facial/tongue/throat swelling, SOB or lightheadedness with hypotension: No Has patient had a PCN reaction causing severe rash involving mucus membranes or skin necrosis: No Has patient had a PCN reaction that required hospitalization No Has patient had a PCN reaction occurring within the last 10 years: No If all of the above answers are "NO", then may proceed with Cephalosporin use.   . Decadron [Dexamethasone] Itching    Social History   Socioeconomic History  . Marital status: Legally Separated    Spouse name: Not on file  . Number of children: 3  . Years of education: Not on file  . Highest education level: Not on file  Occupational History  . Not on  file  Social Needs  . Financial resource strain: Not on file  . Food insecurity:    Worry: Not on file    Inability: Not on file  . Transportation needs:    Medical: Not on file    Non-medical: Not on file  Tobacco Use  . Smoking status: Former Smoker    Packs/day: 1.50    Years: 8.00    Pack years: 12.00    Last attempt to quit: 03/15/2003    Years since quitting: 14.8  . Smokeless tobacco:  Never Used  Substance and Sexual Activity  . Alcohol use: No    Comment: none since 2004  . Drug use: No    Comment: former  none since 2004  . Sexual activity: Not on file  Lifestyle  . Physical activity:    Days per week: Not on file    Minutes per session: Not on file  . Stress: Not on file  Relationships  . Social connections:    Talks on phone: Not on file    Gets together: Not on file    Attends religious service: Not on file    Active member of club or organization: Not on file    Attends meetings of clubs or organizations: Not on file    Relationship status: Not on file  . Intimate partner violence:    Fear of current or ex partner: Not on file    Emotionally abused: Not on file    Physically abused: Not on file    Forced sexual activity: Not on file  Other Topics Concern  . Not on file  Social History Narrative  . Not on file     Review of Systems: General: negative for chills, fever, night sweats or weight changes.  Cardiovascular: negative for chest pain, dyspnea on exertion, edema, orthopnea, palpitations, paroxysmal nocturnal dyspnea or shortness of breath Dermatological: negative for rash Respiratory: negative for cough or wheezing Urologic: negative for hematuria Abdominal: negative for nausea, vomiting, diarrhea, bright red blood per rectum, melena, or hematemesis Neurologic: negative for visual changes, syncope, or dizziness All other systems reviewed and are otherwise negative except as noted above.    Blood pressure 130/78, pulse 80, height 5\' 6"  (1.676 m), weight 167 lb 3.2 oz (75.8 kg).  General appearance: alert and no distress Neck: no adenopathy, no carotid bruit, no JVD, supple, symmetrical, trachea midline and thyroid not enlarged, symmetric, no tenderness/mass/nodules Lungs: clear to auscultation bilaterally Heart: regular rate and rhythm, S1, S2 normal, no murmur, click, rub or gallop Extremities: extremities normal, atraumatic, no cyanosis  or edema Pulses: 2+ and symmetric Skin: Skin color, texture, turgor normal. No rashes or lesions Neurologic: Alert and oriented X 3, normal strength and tone. Normal symmetric reflexes. Normal coordination and gait  EKG not performed today  ASSESSMENT AND PLAN:   Claudication in peripheral vascular disease [facility]) Mr. Lankford complains of bilateral calf claudication. I can palpate pedal pulses. I'm going to check lower extremity arterial Doppler studies.  Essential hypertension History of essential hypertension blood pressure measured at 130/78.He is on carvedilol, hydrochlorothiazide and lisinopril. Continue current meds are dosing.  NICM (nonischemic cardiomyopathy) ([place]) History of nonischemic cardiomyopathy with cardiac catheterization performed by Dr. Ellyn Hack 03/11/16 confirming this.His ejection fraction initially was 25% which rose to normal on medical therapy. Recently he's noticed increasing shortness of breath on exertion. We will recheck a 2-D echocardiogram.      Lorretta Harp MD [facility], [facility] 12/31/2017 4:04 PM

## 2017-12-31 NOTE — Patient Instructions (Signed)
Medication Instructions: Your physician recommends that you continue on your current medications as directed. Please refer to the Current Medication list given to you today.   Testing/Procedures: Your physician has requested that you have an echocardiogram. Echocardiography is a painless test that uses sound waves to create images of your heart. It provides your doctor with information about the size and shape of your heart and how well your heart's chambers and valves are working. This procedure takes approximately one hour. There are no restrictions for this procedure.  Your physician has requested that you have a lower extremity arterial duplex. During this test, ultrasound is used to evaluate arterial blood flow in the legs. Allow one hour for this exam. There are no restrictions or special instructions.  Your physician has requested that you have an ankle brachial index (ABI). During this test an ultrasound and blood pressure cuff are used to evaluate the arteries that supply the arms and legs with blood. Allow thirty minutes for this exam. There are no restrictions or special instructions.  Follow-Up: Your physician wants you to follow-up in: 1 year with Dr. [name]. You will receive a reminder letter in the mail two months in advance. If you don't receive a letter, please call our office to schedule the follow-up appointment.  If you need a refill on your cardiac medications before your next appointment, please call your pharmacy.

## 2017-12-31 NOTE — Assessment & Plan Note (Signed)
Mr. Arko complains of bilateral calf claudication. I can palpate pedal pulses. I'm going to check lower extremity arterial Doppler studies.

## 2017-12-31 NOTE — Assessment & Plan Note (Addendum)
History of essential hypertension blood pressure measured at 130/78.He is on carvedilol, hydrochlorothiazide and lisinopril. Continue current meds are dosing.

## 2018-01-05 ENCOUNTER — Other Ambulatory Visit: Payer: Self-pay

## 2018-01-05 ENCOUNTER — Ambulatory Visit (HOSPITAL_COMMUNITY): Payer: Medicaid Other | Attending: Cardiology

## 2018-01-05 DIAGNOSIS — I110 Hypertensive heart disease with heart failure: Secondary | ICD-10-CM | POA: Insufficient documentation

## 2018-01-05 DIAGNOSIS — R0602 Shortness of breath: Secondary | ICD-10-CM | POA: Insufficient documentation

## 2018-01-05 DIAGNOSIS — I509 Heart failure, unspecified: Secondary | ICD-10-CM | POA: Diagnosis not present

## 2018-01-05 DIAGNOSIS — I420 Dilated cardiomyopathy: Secondary | ICD-10-CM | POA: Diagnosis not present

## 2018-01-05 DIAGNOSIS — I429 Cardiomyopathy, unspecified: Secondary | ICD-10-CM | POA: Insufficient documentation

## 2018-01-08 MED FILL — !VIAGRA 25 MG TABLET: 25 | 30 days supply | Qty: 10 | Fill #2

## 2018-01-09 ENCOUNTER — Inpatient Hospital Stay (HOSPITAL_COMMUNITY): Admission: RE | Admit: 2018-01-09 | Payer: Medicaid Other | Source: Ambulatory Visit

## 2018-01-09 ENCOUNTER — Ambulatory Visit (HOSPITAL_COMMUNITY)
Admission: RE | Admit: 2018-01-09 | Discharge: 2018-01-09 | Disposition: A | Discharge status: Home / Self Care | Payer: Medicaid Other | Source: Ambulatory Visit | Attending: Cardiovascular Disease | Admitting: Cardiovascular Disease

## 2018-01-09 DIAGNOSIS — Z87891 Personal history of nicotine dependence: Secondary | ICD-10-CM | POA: Insufficient documentation

## 2018-01-09 DIAGNOSIS — I10 Essential (primary) hypertension: Secondary | ICD-10-CM | POA: Diagnosis not present

## 2018-01-09 DIAGNOSIS — I739 Peripheral vascular disease, unspecified: Secondary | ICD-10-CM | POA: Diagnosis not present

## 2018-01-15 ENCOUNTER — Other Ambulatory Visit: Payer: Self-pay

## 2018-01-15 ENCOUNTER — Telehealth: Payer: Self-pay | Admitting: Internal Medicine

## 2018-01-15 DIAGNOSIS — I428 Other cardiomyopathies: Secondary | ICD-10-CM

## 2018-01-15 DIAGNOSIS — I10 Essential (primary) hypertension: Secondary | ICD-10-CM

## 2018-01-15 MED ORDER — LISINOPRIL 40 MG PO TABS
40.0000 mg | ORAL_TABLET | Freq: Every day | ORAL | 1 refills | Status: DC
Start: 2018-01-15 — End: 2018-02-16

## 2018-01-15 MED ORDER — HYDROCHLOROTHIAZIDE 25 MG PO TABS: 25.0000 mg | ORAL_TABLET | Freq: Every day | ORAL | 1 refills | Status: DC

## 2018-01-15 MED ORDER — METFORMIN HCL ER 500 MG PO TB24: 500.0000 mg | ORAL_TABLET | Freq: Every day | ORAL | 0 refills | Status: DC

## 2018-01-15 NOTE — Telephone Encounter (Signed)
Contacted pt to inform him that his carvediol was filled 3-19 and it was sent to [facility]

## 2018-01-15 NOTE — Telephone Encounter (Signed)
Patient called and requested for listed medications to be refilled [facility] on [address].  hydrochlorothiazide (HYDRODIURIL) 25 MG tablet [[id number]]  carvedilol (COREG) 25 MG tablet [[id number]] metFORMIN (GLUCOPHAGE XR) 500 MG 24 hr tablet [[id number]]  lisinopril (PRINIVIL,ZESTRIL) 40 MG tablet [[id number]]

## 2018-01-28 ENCOUNTER — Encounter: Payer: Self-pay | Admitting: Cardiovascular Disease

## 2018-01-28 ENCOUNTER — Ambulatory Visit (INDEPENDENT_AMBULATORY_CARE_PROVIDER_SITE_OTHER): Payer: Medicaid Other | Admitting: Cardiovascular Disease

## 2018-01-28 VITALS — BP 100/70 | HR 93 | Ht 66.0 in | Wt 163.0 lb

## 2018-01-28 DIAGNOSIS — I428 Other cardiomyopathies: Secondary | ICD-10-CM | POA: Diagnosis not present

## 2018-01-28 DIAGNOSIS — I739 Peripheral vascular disease, unspecified: Secondary | ICD-10-CM | POA: Diagnosis not present

## 2018-01-28 NOTE — Progress Notes (Signed)
Jonathan Dickson returns today for follow-up of his outpatient noninvasive tests. A 2-D echo performed 01/05/18 revealed a decline in his LV function to 30-35% to near normal 09/11/17 with recurrent symptoms. He may benefit from initiation of Entresto .I am going to Refer him to the heart failure clinic for further evaluation and treatment. Because of complaints of claudication signal pressures were obtained which were normal.  Lorretta Harp, M.D., [place], [facility], [address] [place], [place]  [id number]  [phone] 01/28/2018 2:18 PM

## 2018-01-28 NOTE — Assessment & Plan Note (Signed)
Jonathan Dickson returns today for follow-up of his 2-D echo recently performed on 01/05/18 because of recurrent symptoms of heart failure in the setting of known nonischemic cardiomyopathy. His EF has declined from near normal on medical therapy down to 30-35% and he has had recurrent symptoms for unclear reasons. He may benefit from the initiation of interest to know. I am going to refer him to the heart failure clinic for further evaluation and treatment.

## 2018-01-28 NOTE — Patient Instructions (Signed)
Medication Instructions: Your physician recommends that you continue on your current medications as directed. Please refer to the Current Medication list given to you today.   Follow-Up: You have been referred to the Heart Failure Clinic.  Your physician wants you to follow-up in: 6 months with Dr. Berry. You will receive a reminder letter in the mail two months in advance. If you don't receive a letter, please call our office to schedule the follow-up appointment.  If you need a refill on your cardiac medications before your next appointment, please call your pharmacy.  

## 2018-01-28 NOTE — Assessment & Plan Note (Signed)
Symptoms of claudication with recent Dopplers that showed normal ABIs.

## 2018-02-09 ENCOUNTER — Telehealth: Payer: Self-pay | Admitting: Cardiovascular Disease

## 2018-02-09 NOTE — Telephone Encounter (Signed)
Spoke with patient and he has been trying to get an appointment with HF clinic. I was unable to reach clinic, sent staff message. Advised patient would call him back when had further information

## 2018-02-09 NOTE — Telephone Encounter (Signed)
New message:     Pt is calling and states that he can't get an appt from his referral until July. Pt states he was told to make an appt with him in the next 10 days. Pt states can you please call their office to get him in.

## 2018-02-10 NOTE — Telephone Encounter (Signed)
Received message from [place] at HF clinic and patient has appointment 5/20 and is aware of date and time.

## 2018-02-12 MED FILL — $VIAGRA 25 MG TABLET: 25 MG | 30 days supply | Qty: 10 | Fill #3

## 2018-02-16 ENCOUNTER — Ambulatory Visit (HOSPITAL_COMMUNITY)
Admission: RE | Admit: 2018-02-16 | Discharge: 2018-02-16 | Disposition: A | Discharge status: Home / Self Care | Payer: Medicaid Other | Source: Ambulatory Visit | Attending: Internal Medicine | Admitting: Internal Medicine

## 2018-02-16 ENCOUNTER — Encounter (HOSPITAL_COMMUNITY): Payer: Self-pay | Admitting: Internal Medicine

## 2018-02-16 VITALS — BP 100/62 | HR 74 | Wt 166.8 lb

## 2018-02-16 DIAGNOSIS — I10 Essential (primary) hypertension: Secondary | ICD-10-CM | POA: Diagnosis not present

## 2018-02-16 DIAGNOSIS — I5022 Chronic systolic (congestive) heart failure: Secondary | ICD-10-CM | POA: Diagnosis not present

## 2018-02-16 DIAGNOSIS — I5042 Chronic combined systolic (congestive) and diastolic (congestive) heart failure: Secondary | ICD-10-CM

## 2018-02-16 DIAGNOSIS — I110 Hypertensive heart disease with heart failure: Secondary | ICD-10-CM | POA: Insufficient documentation

## 2018-02-16 DIAGNOSIS — F329 Major depressive disorder, single episode, unspecified: Secondary | ICD-10-CM | POA: Insufficient documentation

## 2018-02-16 DIAGNOSIS — Z7984 Long term (current) use of oral hypoglycemic drugs: Secondary | ICD-10-CM | POA: Diagnosis not present

## 2018-02-16 DIAGNOSIS — E119 Type 2 diabetes mellitus without complications: Secondary | ICD-10-CM | POA: Insufficient documentation

## 2018-02-16 DIAGNOSIS — Z880 Allergy status to penicillin: Secondary | ICD-10-CM | POA: Diagnosis not present

## 2018-02-16 DIAGNOSIS — Z79899 Other long term (current) drug therapy: Secondary | ICD-10-CM | POA: Insufficient documentation

## 2018-02-16 DIAGNOSIS — F419 Anxiety disorder, unspecified: Secondary | ICD-10-CM | POA: Insufficient documentation

## 2018-02-16 DIAGNOSIS — Z87891 Personal history of nicotine dependence: Secondary | ICD-10-CM | POA: Insufficient documentation

## 2018-02-16 DIAGNOSIS — Z833 Family history of diabetes mellitus: Secondary | ICD-10-CM | POA: Diagnosis not present

## 2018-02-16 LAB — BASIC METABOLIC PANEL
Anion gap: 8 (ref 5–15)
BUN: 22 mg/dL — ABNORMAL HIGH (ref 6–20)
CALCIUM: 9.6 mg/dL (ref 8.9–10.3)
CHLORIDE: 106 mmol/L (ref 101–111)
CO2: 23 mmol/L (ref 22–32)
Creatinine, Ser: 1.7 mg/dL — ABNORMAL HIGH (ref 0.61–1.24)
GFR calc non Af Amer: 41 mL/min — ABNORMAL LOW (ref >60)
GFR, EST AFRICAN AMERICAN: 48 mL/min — AB (ref >60)
GLUCOSE: 104 mg/dL — AB (ref 65–99)
POTASSIUM: 4.2 mmol/L (ref 3.5–5.1)
Sodium: 137 mmol/L (ref 135–145)

## 2018-02-16 LAB — BRAIN NATRIURETIC PEPTIDE: B Natriuretic Peptide: 16.3 pg/mL (ref 0.0–100.0)

## 2018-02-16 MED ORDER — SACUBITRIL-VALSARTAN 49-51 MG PO TABS: 1.0000 | ORAL_TABLET | Freq: Two times a day (BID) | ORAL | 3 refills | Status: DC

## 2018-02-16 NOTE — Patient Instructions (Signed)
Stop HCTZ (hydrocholathiazide)   Stop Lisinopril  Start Entresto 49/51 mg Twice daily STARTING ON WED 5/22 AM  Labs today  Your physician has requested that you have a cardiac MRI. Cardiac MRI uses a computer to create images of your heart as its beating, producing both still and moving pictures of your heart and major blood vessels. For further information please visit [email or web address] Please follow the instruction sheet given to you today for more information.  ONCE THIS IS APPROVED BY YOUR INSURANCE THEY WILL CALL YOU TO SCHEDULE.  Your physician recommends that you schedule a follow-up appointment in: 3-4 weeks

## 2018-02-16 NOTE — Progress Notes (Signed)
ADVANCED HF CLINIC CONSULT NOTE  Referring Physician: Dr [name]  Primary Care: Dr Wynetta Emery at [facility]  Primary Cardiologist: Dr [name]  HPI: Mr Timko is a 62 year old referred to the HF clinic by Dr [name] for Heart Failure evaluation. EF had dropped from 50-55% -->30-35%.   Mr Arteaga is a 62 year old with a HTN, previous polysubstance abuse - including heroin (quit 2004) and depression. He is referred by Dr. [name] for further evaluation of his HF and dropping EF   Had Myoview in 5/17 with EF 22%. Subsequently underwent cath 6/17. EF 40-45%. Normal coronaries   Over the last 7-8 months he noticed increase dyspnea with exertion and steps. 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 He reports increased dyspnea with sexual relations. Weight at home has been trending up about 30 pounds. Takes all medications. Mild edema. Denies CP, orthopnea or PND. Had f/u echo 4/19 and EF read as 30-35% (I felt it was closer to 40% with mild LVH).    ECHO 12/2017  Left ventricle: The cavity size was normal. Wall thickness was   increased in a pattern of mild LVH. Systolic function was   moderately to severely reduced. The estimated ejection fraction   was in the range of 30% to 35%. Diffuse hypokinesis. Doppler   parameters are consistent with abnormal left ventricular   relaxation (grade 1 diastolic dysfunction). Impressions:- Moderate to severe global reduction in LV systolic function; mild   LVH; mild diastolic dysfunction.  ECHO EF 08/2017  Left ventricle: Wall thickness was increased in a pattern of   moderate LVH. Systolic function was normal. The estimated   ejection fraction was in the range of 50% to 55%. Wall motion was   normal; there were no regional wall motion abnormalities. Doppler   parameters are consistent with abnormal left ventricular   relaxation (grade 1 diastolic dysfunction). - Aortic valve: There was  trivial regurgitation. - Mitral valve: There was mild regurgitation.   Review of Systems: [y] = yes, [ ]  = no   General: Weight gain [Y ]; Weight loss [ ] ; Anorexia [ ] ; Fatigue [Y ]; Fever [ ] ; Chills [ ] ; Weakness [ ]   Cardiac: Chest pain/pressure [ ] ; Resting SOB [ ] ; Exertional SOB [Y]; Orthopnea [ ] ; Pedal Edema [ Y]; Palpitations [ ] ; Syncope [ ] ; Presyncope [ ] ; Paroxysmal nocturnal dyspnea[ ]   Pulmonary: Cough [ ] ; Wheezing[ ] ; Hemoptysis[ ] ; Sputum [ ] ; Snoring [ ]   GI: Vomiting[ ] ; Dysphagia[ ] ; Melena[ ] ; Hematochezia [ ] ; Heartburn[ ] ; Abdominal pain [ ] ; Constipation [ ] ; Diarrhea [ ] ; BRBPR [ ]   GU: Hematuria[ ] ; Dysuria [ ] ; Nocturia[ ]   Vascular: Pain in legs with walking [ ] ; Pain in feet with lying flat [ ] ; Non-healing sores [ ] ; Stroke [ ] ; TIA [ ] ; Slurred speech [ ] ;  Neuro: Headaches[ ] ; Vertigo[ ] ; Seizures[ ] ; Paresthesias[ ] ;Blurred vision [ ] ; Diplopia [ ] ; Vision changes [ ]   Ortho/Skin: Arthritis [Y ]; Joint pain [Y ]; Muscle pain [ ] ; Joint swelling [ ] ; Back Pain [ ] ; Rash [ ]   Psych: Depression[Y ]; Anxiety[Y]  Heme: Bleeding problems [ ] ; Clotting disorders [ ] ; Anemia [ ]   Endocrine: Diabetes [Y ]; Thyroid dysfunction[ ]    Past Medical History:  Diagnosis Date  . Abnormal TSH 02/2016  . Anxiety   . Cervical disc disease   .  Chronic combined systolic and diastolic CHF (congestive heart failure) ([place])    a. 01/2016 Echo: EF 25%, inf AK, diffuse sev HK, Gr1 DD, mild MR.  . Depression   . Hyperglycemia   . Hypertension   . Hypertensive heart disease   . Myocardial infarction [facility])    ??  maybe  . NICM (nonischemic cardiomyopathy) ([place])    a. 01/2016 Echo: EF 25%, inf AK, diffuse sev HK, Gr1 DD, mild MR; b. 01/2016 MV: EF 22%, no isch/infarct;  c. 02/2016 Cath: Nl cors, EF 35-45%.  . Pre-diabetes   . Seizures ([place])    with penicillin    Current Outpatient Medications  Medication Sig Dispense Refill  . amitriptyline (ELAVIL) 25 MG tablet Take 1 tablet  (25 mg total) by mouth at bedtime as needed for sleep. 30 tablet 3  . carvedilol (COREG) 25 MG tablet Take 1 tablet (25 mg total) by mouth 2 (two) times daily. 180 tablet 3  . cholecalciferol (VITAMIN D) 1000 units tablet Take 1,000 Units by mouth daily.    . hydrochlorothiazide (HYDRODIURIL) 25 MG tablet Take 1 tablet (25 mg total) by mouth daily. 90 tablet 1  . lisinopril (PRINIVIL,ZESTRIL) 40 MG tablet Take 1 tablet (40 mg total) by mouth daily. 90 tablet 1  . loratadine (CLARITIN) 10 MG tablet Take 1 tablet (10 mg total) by mouth daily. 30 tablet 3  . metFORMIN (GLUCOPHAGE XR) 500 MG 24 hr tablet Take 1 tablet (500 mg total) by mouth daily with breakfast. 90 tablet 0  . sildenafil (VIAGRA) 25 MG tablet Take 1 tablet (25 mg total) by mouth daily as needed for erectile dysfunction. 10 tablet 6  . traMADol-acetaminophen (ULTRACET) 37.5-325 MG tablet Take 1 tablet by mouth every 6 (six) hours as needed.     No current facility-administered medications for this encounter.     Allergies  Allergen Reactions  . Peanut-Containing Drug Products Swelling  . Penicillins Other (See Comments)    CONVULSIONS Has patient had a PCN reaction causing immediate rash, facial/tongue/throat swelling, SOB or lightheadedness with hypotension: No Has patient had a PCN reaction causing severe rash involving mucus membranes or skin necrosis: No Has patient had a PCN reaction that required hospitalization No Has patient had a PCN reaction occurring within the last 10 years: No If all of the above answers are "NO", then may proceed with Cephalosporin use.   . Decadron [Dexamethasone] Itching      Social History   Socioeconomic History  . Marital status: Legally Separated    Spouse name: Not on file  . Number of children: 3  . Years of education: Not on file  . Highest education level: Not on file  Occupational History  . Not on file  Social Needs  . Financial resource strain: Not on file  . Food  insecurity:    Worry: Not on file    Inability: Not on file  . Transportation needs:    Medical: Not on file    Non-medical: Not on file  Tobacco Use  . Smoking status: Former Smoker    Packs/day: 1.50    Years: 8.00    Pack years: 12.00    Last attempt to quit: 03/15/2003    Years since quitting: 14.9  . Smokeless tobacco: Never Used  Substance and Sexual Activity  . Alcohol use: No    Comment: none since 2004  . Drug use: No    Comment: former  none since 2004  . Sexual activity: Not  on file  Lifestyle  . Physical activity:    Days per week: Not on file    Minutes per session: Not on file  . Stress: Not on file  Relationships  . Social connections:    Talks on phone: Not on file    Gets together: Not on file    Attends religious service: Not on file    Active member of club or organization: Not on file    Attends meetings of clubs or organizations: Not on file    Relationship status: Not on file  . Intimate partner violence:    Fear of current or ex partner: Not on file    Emotionally abused: Not on file    Physically abused: Not on file    Forced sexual activity: Not on file  Other Topics Concern  . Not on file  Social History Narrative  . Not on file      Family History  Problem Relation Age of Onset  . Diabetes Mother   . Diabetes Sister     Vitals:   02/16/18 1512  BP: 100/62  Pulse: 74  SpO2: 97%  Weight: 166 lb 12.8 oz (75.7 kg)    PHYSICAL EXAM: General:  Well appearing. No respiratory difficulty HEENT: normal anicteric Neck: supple. JVP 5-6 . Carotids 2+ bilat; no bruits. No lymphadenopathy or thryomegaly appreciated. Cor: PMI nondisplaced. Regular rate & rhythm. No rubs, gallops or murmurs. Lungs: clear no wheeze Abdomen: soft, nontender, nondistended. No hepatosplenomegaly. No bruits or masses. Good bowel sounds. Extremities: no cyanosis, clubbing, rash, edema Neuro: alert & oriented x 3, cranial nerves grossly intact. moves all 4  extremities w/o difficulty. Affect pleasant   ASSESSMENT & PLAN: 1. Chronic Systolic Heart Failure, [facility]. Normal LHC 2017 NYHA III.  ECHO EF dropped form 50-55%-->40%. Set up CMRI Volume status stable. Stop HCTZ Stop lisinopril . Allow 36 hour wash out. Start entresto 49-51 mg tiwce a day On goal dose carvedilol 25 mg twice a day Check BMET in 10 days  2.DMII On metformin  3. HTN Well controlled  Follow up 4 weeks with Dr Haroldine Laws   Darrick Grinder, NP-C  Patient seen and examined with Darrick Grinder, NP. We discussed all aspects of the encounter. I agree with the assessment and plan as stated above.   I have reviewed all previous echos, cath and Myoview personally. Myoview in 5/7 with EF 22% but cath 6/17 EF 40-45% with normal coronaries. Echo in 6/18 also with EF 40-45%. Echo 12/18 EF 45-50%  Had echo in 4/19 read as 30-35% but I feel EF much closer to 40-45%.   He reports worsening NYHA III symptoms but on exam no evidence of volume overload or poor perfusion. BNP 16.Has gained 30 pounds in the last year.    Overall, I am not sure much has changed here. 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 Ideally would get cMRI but creatinine up again today at 1.7. Will start with PYP testing. Agree with switching lisinopril to Entresto and stopping HCTZ. Need to watch for volume depletion.   CPX testing may be helpful here as well to get objective measure of functional capacity.Await results of PYP.   [name], MD  11:08 PM

## 2018-02-17 ENCOUNTER — Telehealth (HOSPITAL_COMMUNITY): Payer: Self-pay | Admitting: *Deleted

## 2018-02-17 DIAGNOSIS — I5042 Chronic combined systolic (congestive) and diastolic (congestive) heart failure: Secondary | ICD-10-CM

## 2018-02-17 DIAGNOSIS — I428 Other cardiomyopathies: Secondary | ICD-10-CM

## 2018-02-17 NOTE — Addendum Note (Signed)
Encounter addended by: Scarlette Calico, RN on: 02/17/2018 11:10 AM  Actions taken: Order list changed

## 2018-02-17 NOTE — Telephone Encounter (Signed)
-----   Message from Jolaine Artist, MD sent at 02/16/2018 11:09 PM EDT ----- Nira Conn - Creatinine too high for cMRI. Let's start with PYP scan. Thanks -dan

## 2018-02-17 NOTE — Telephone Encounter (Signed)
Spoke w/pt he is aware, cMRI order d/c'd and PYP scan ordered.

## 2018-02-18 ENCOUNTER — Telehealth (HOSPITAL_COMMUNITY): Payer: Self-pay | Admitting: *Deleted

## 2018-02-18 ENCOUNTER — Telehealth (HOSPITAL_COMMUNITY): Payer: Self-pay | Admitting: Internal Medicine

## 2018-02-18 NOTE — Telephone Encounter (Signed)
Patient aware of appt

## 2018-02-18 NOTE — Telephone Encounter (Signed)
Entresto PA submitted through [employer].  Confirmation#- [id number].  Will wait for approval.

## 2018-02-18 NOTE — Telephone Encounter (Signed)
Entresto approved through [employer] from 02/18/18 through 02/13/19.  PA# [id number]

## 2018-02-18 NOTE — Telephone Encounter (Signed)
Called patient and left message to call the office.  Need to give pt appt info for PYP test.

## 2018-02-19 ENCOUNTER — Telehealth (HOSPITAL_COMMUNITY): Payer: Self-pay | Admitting: Internal Medicine

## 2018-02-19 NOTE — Telephone Encounter (Signed)
Patient returned my call.  He is aware of appt 02/25/18 arrival @11 :45 am, Jonathan Dickson

## 2018-02-19 NOTE — Telephone Encounter (Signed)
Called and left message for patient to call back.  Need to give pt PYP appt info.

## 2018-02-25 ENCOUNTER — Encounter (HOSPITAL_COMMUNITY): Payer: Medicaid Other

## 2018-02-25 ENCOUNTER — Encounter (HOSPITAL_COMMUNITY): Payer: Self-pay

## 2018-02-25 ENCOUNTER — Encounter (HOSPITAL_COMMUNITY)
Admission: RE | Admit: 2018-02-25 | Discharge: 2018-02-25 | Disposition: A | Discharge status: Home / Self Care | Payer: Medicaid Other | Source: Ambulatory Visit | Attending: Internal Medicine | Admitting: Internal Medicine

## 2018-02-25 DIAGNOSIS — I5042 Chronic combined systolic (congestive) and diastolic (congestive) heart failure: Secondary | ICD-10-CM | POA: Diagnosis present

## 2018-02-25 DIAGNOSIS — I428 Other cardiomyopathies: Secondary | ICD-10-CM

## 2018-02-25 MED ORDER — TECHNETIUM TC 99M PYROPHOSPHATE
22.2000 | Freq: Once | INTRAVENOUS | Status: DC
  Filled 2018-02-25: qty 23

## 2018-03-04 ENCOUNTER — Other Ambulatory Visit: Payer: Self-pay | Admitting: Internal Medicine

## 2018-03-04 MED ORDER — AMITRIPTYLINE HCL 25 MG PO TABS: 25.0000 mg | ORAL_TABLET | Freq: Every evening | ORAL | 3 refills | Status: DC | PRN

## 2018-03-11 ENCOUNTER — Other Ambulatory Visit: Payer: Self-pay

## 2018-03-11 ENCOUNTER — Ambulatory Visit (HOSPITAL_COMMUNITY)
Admission: RE | Admit: 2018-03-11 | Discharge: 2018-03-11 | Disposition: A | Discharge status: Home / Self Care | Payer: Medicaid Other | Source: Ambulatory Visit | Attending: Internal Medicine | Admitting: Internal Medicine

## 2018-03-11 VITALS — BP 155/97 | HR 78 | Wt 168.8 lb

## 2018-03-11 DIAGNOSIS — Z87891 Personal history of nicotine dependence: Secondary | ICD-10-CM | POA: Diagnosis not present

## 2018-03-11 DIAGNOSIS — I10 Essential (primary) hypertension: Secondary | ICD-10-CM

## 2018-03-11 DIAGNOSIS — Z79899 Other long term (current) drug therapy: Secondary | ICD-10-CM | POA: Diagnosis not present

## 2018-03-11 DIAGNOSIS — Z880 Allergy status to penicillin: Secondary | ICD-10-CM | POA: Diagnosis not present

## 2018-03-11 DIAGNOSIS — I252 Old myocardial infarction: Secondary | ICD-10-CM | POA: Diagnosis not present

## 2018-03-11 DIAGNOSIS — Z7984 Long term (current) use of oral hypoglycemic drugs: Secondary | ICD-10-CM | POA: Diagnosis not present

## 2018-03-11 DIAGNOSIS — I5042 Chronic combined systolic (congestive) and diastolic (congestive) heart failure: Secondary | ICD-10-CM

## 2018-03-11 DIAGNOSIS — E119 Type 2 diabetes mellitus without complications: Secondary | ICD-10-CM | POA: Diagnosis not present

## 2018-03-11 DIAGNOSIS — F329 Major depressive disorder, single episode, unspecified: Secondary | ICD-10-CM | POA: Insufficient documentation

## 2018-03-11 DIAGNOSIS — F419 Anxiety disorder, unspecified: Secondary | ICD-10-CM | POA: Diagnosis not present

## 2018-03-11 DIAGNOSIS — I5022 Chronic systolic (congestive) heart failure: Secondary | ICD-10-CM | POA: Diagnosis present

## 2018-03-11 DIAGNOSIS — I110 Hypertensive heart disease with heart failure: Secondary | ICD-10-CM | POA: Insufficient documentation

## 2018-03-11 DIAGNOSIS — I428 Other cardiomyopathies: Secondary | ICD-10-CM | POA: Diagnosis not present

## 2018-03-11 LAB — BASIC METABOLIC PANEL
Anion gap: 7 (ref 5–15)
BUN: 10 mg/dL (ref 6–20)
CALCIUM: 9.3 mg/dL (ref 8.9–10.3)
CO2: 20 mmol/L — ABNORMAL LOW (ref 22–32)
CREATININE: 1.06 mg/dL (ref 0.61–1.24)
Chloride: 111 mmol/L (ref 101–111)
Glucose, Bld: 102 mg/dL — ABNORMAL HIGH (ref 65–99)
Potassium: 4.3 mmol/L (ref 3.5–5.1)
SODIUM: 138 mmol/L (ref 135–145)

## 2018-03-11 MED ORDER — SACUBITRIL-VALSARTAN 97-103 MG PO TABS: 1.0000 | ORAL_TABLET | Freq: Two times a day (BID) | ORAL | 6 refills | Status: DC

## 2018-03-11 NOTE — Patient Instructions (Signed)
Increase Entresto 97/103 mg Twice daily   Labs today  Your physician has recommended that you have a cardiopulmonary stress test (CPX). CPX testing is a non-invasive measurement of heart and lung function. It replaces a traditional treadmill stress test. This type of test provides a tremendous amount of information that relates not only to your present condition but also for future outcomes. This test combines measurements of you ventilation, respiratory gas exchange in the lungs, electrocardiogram (EKG), blood pressure and physical response before, during, and following an exercise protocol.  Genetic test has been done, this has to be sent to [place] to be processed and can take 1-2 weeks to get results back.  We will let you know the results.  If your kidney looks ok on your lab work will have you get a Cardiac MRI  Your physician recommends that you schedule a follow-up appointment in: 6-8 weeks

## 2018-03-11 NOTE — Progress Notes (Signed)
ADVANCED HF CLINIC CONSULT NOTE  Referring Physician: Dr [name]  Primary Care: Dr Wynetta Emery at [facility]  Primary Cardiologist: Dr [name]  HPI:  Jonathan Dickson is a 62 year old with a HTN, previous polysubstance abuse - including heroin (quit 2004) and depression. He was referred by Dr. [name] in 5/19 for further evaluation of his HF and dropping EF 50-55% -->30-35%.  Had Myoview in 5/17 with EF 22%. Subsequently underwent cath 6/17. EF 40-45%. Normal coronaries   We saw him last month for the first time and I reviewed studies and felt that EF was relatively stable around 40-45%. We ordered cMRI given suspicion for possible amyloid. However creatinine was 1.7. PYP scan obtained which was positive (Visual grade 2. Quantitative 1.57). Lisinopril switched to Entresto. Returns for f/u.   Says he feels about the same. 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 But if has to walk a long way or go up steps gets SOB. No CP, edema, orthopnea or PND. Tolerating Entresto well. No dizziness. No carpal tunnel syndrome.   ECHO 12/2017  Left ventricle: EF 30% to 35% (I felt closer to 40-45%). Mild LVH Diffuse hypokinesis. Doppler   parameters are consistent with abnormal left ventricular   relaxation (grade 1 diastolic dysfunction).  ECHO EF 08/2017  Left ventricle: EF 50% to 55%.     Past Medical History:  Diagnosis Date  . Abnormal TSH 02/2016  . Anxiety   . Cervical disc disease   . Chronic combined systolic and diastolic CHF (congestive heart failure) ([place])    a. 01/2016 Echo: EF 25%, inf AK, diffuse sev HK, Gr1 DD, mild Jonathan.  . Depression   . Hyperglycemia   . Hypertension   . Hypertensive heart disease   . Myocardial infarction [facility])    ??  maybe  . NICM (nonischemic cardiomyopathy) ([place])    a. 01/2016 Echo: EF 25%, inf AK, diffuse sev HK, Gr1 DD, mild Jonathan; b. 01/2016 MV: EF 22%, no isch/infarct;  c. 02/2016 Cath: Nl cors, EF 35-45%.  . Pre-diabetes   .  Seizures ([place])    with penicillin    Current Outpatient Medications  Medication Sig Dispense Refill  . amitriptyline (ELAVIL) 25 MG tablet Take 1 tablet (25 mg total) by mouth at bedtime as needed for sleep. 30 tablet 3  . carvedilol (COREG) 25 MG tablet Take 1 tablet (25 mg total) by mouth 2 (two) times daily. 180 tablet 3  . cholecalciferol (VITAMIN D) 1000 units tablet Take 1,000 Units by mouth daily.    Marland Kitchen loratadine (CLARITIN) 10 MG tablet Take 1 tablet (10 mg total) by mouth daily. 30 tablet 3  . metFORMIN (GLUCOPHAGE XR) 500 MG 24 hr tablet Take 1 tablet (500 mg total) by mouth daily with breakfast. 90 tablet 0  . sacubitril-valsartan (ENTRESTO) 49-51 MG Take 1 tablet by mouth 2 (two) times daily. 60 tablet 3  . sildenafil (VIAGRA) 25 MG tablet Take 1 tablet (25 mg total) by mouth daily as needed for erectile dysfunction. 10 tablet 6  . traMADol-acetaminophen (ULTRACET) 37.5-325 MG tablet Take 1 tablet by mouth every 6 (six) hours as needed.     No current facility-administered medications for this encounter.     Allergies  Allergen Reactions  . Peanut-Containing Drug Products Swelling  . Penicillins Other (See Comments)    CONVULSIONS Has patient had a PCN reaction causing immediate rash, facial/tongue/throat swelling, SOB or lightheadedness with hypotension: No Has  patient had a PCN reaction causing severe rash involving mucus membranes or skin necrosis: No Has patient had a PCN reaction that required hospitalization No Has patient had a PCN reaction occurring within the last 10 years: No If all of the above answers are "NO", then may proceed with Cephalosporin use.   . Decadron [Dexamethasone] Itching      Social History   Socioeconomic History  . Marital status: Legally Separated    Spouse name: Not on file  . Number of children: 3  . Years of education: Not on file  . Highest education level: Not on file  Occupational History  . Not on file  Social Needs  .  Financial resource strain: Not on file  . Food insecurity:    Worry: Not on file    Inability: Not on file  . Transportation needs:    Medical: Not on file    Non-medical: Not on file  Tobacco Use  . Smoking status: Former Smoker    Packs/day: 1.50    Years: 8.00    Pack years: 12.00    Last attempt to quit: 03/15/2003    Years since quitting: 15.0  . Smokeless tobacco: Never Used  Substance and Sexual Activity  . Alcohol use: No    Comment: none since 2004  . Drug use: No    Comment: former  none since 2004  . Sexual activity: Not on file  Lifestyle  . Physical activity:    Days per week: Not on file    Minutes per session: Not on file  . Stress: Not on file  Relationships  . Social connections:    Talks on phone: Not on file    Gets together: Not on file    Attends religious service: Not on file    Active member of club or organization: Not on file    Attends meetings of clubs or organizations: Not on file    Relationship status: Not on file  . Intimate partner violence:    Fear of current or ex partner: Not on file    Emotionally abused: Not on file    Physically abused: Not on file    Forced sexual activity: Not on file  Other Topics Concern  . Not on file  Social History Narrative  . Not on file      Family History  Problem Relation Age of Onset  . Diabetes Mother   . Diabetes Sister     Vitals:   03/11/18 1040  BP: (!) 155/97  Pulse: 78  SpO2: 98%  Weight: 168 lb 12.8 oz (76.6 kg)    PHYSICAL EXAM: General:  Well appearing. No resp difficulty HEENT: normal Neck: supple. no JVD. Carotids 2+ bilat; no bruits. No lymphadenopathy or thryomegaly appreciated. Cor: PMI nondisplaced. Regular rate & rhythm. No rubs, gallops or murmurs. Lungs: clear Abdomen: soft, nontender, nondistended. No hepatosplenomegaly. No bruits or masses. Good bowel sounds. Extremities: no cyanosis, clubbing, rash, edema Neuro: alert & orientedx3, cranial nerves grossly intact.  moves all 4 extremities w/o difficulty. Affect pleasant  ASSESSMENT & PLAN: 1. Chronic Systolic Heart Failure, [facility]. Normal LHC 2017 - ECHO EF dropped form 50-55%-->40% - Doing ok. Stable NYHA II-III - Volume status ok.  - Increase Entresto to 97/103 - On carvedilol 25 bid  - Consider spiro soon  - Proceed with CPX test to more objectively assess functional capacity - PYP suggestive of TTR amyloid. Will check genetic test. If Cr 1.5 or less will get  cMRI to confirm and reassess EF - Check labs  2.DMII - Stable On metformin - Consider jardiance  3. HTN - Elevated. Increasing Entresto.   [name], MD  10:58 AM

## 2018-03-17 ENCOUNTER — Telehealth (HOSPITAL_COMMUNITY): Payer: Self-pay | Admitting: *Deleted

## 2018-03-17 DIAGNOSIS — I428 Other cardiomyopathies: Secondary | ICD-10-CM

## 2018-03-17 DIAGNOSIS — I5042 Chronic combined systolic (congestive) and diastolic (congestive) heart failure: Secondary | ICD-10-CM

## 2018-03-17 NOTE — Telephone Encounter (Signed)
Order placed, per Jasmine "No Pre-Cert Required" message sent to [facility] to schedule

## 2018-03-17 NOTE — Telephone Encounter (Signed)
-----   Message from Jolaine Artist, MD sent at 03/11/2018 10:30 PM EDT ----- Creatinine now normal Please order cMRI if not already done. Thanks!

## 2018-03-19 ENCOUNTER — Encounter: Payer: Self-pay | Admitting: Internal Medicine

## 2018-03-25 ENCOUNTER — Ambulatory Visit (HOSPITAL_COMMUNITY): Admission: RE | Admit: 2018-03-25 | Payer: Medicaid Other | Source: Ambulatory Visit

## 2018-03-30 ENCOUNTER — Encounter (HOSPITAL_COMMUNITY): Payer: Medicaid Other

## 2018-04-01 ENCOUNTER — Ambulatory Visit (HOSPITAL_COMMUNITY)
Admission: RE | Admit: 2018-04-01 | Discharge: 2018-04-01 | Disposition: A | Discharge status: Home / Self Care | Payer: Medicaid Other | Source: Ambulatory Visit | Attending: Internal Medicine | Admitting: Internal Medicine

## 2018-04-01 DIAGNOSIS — I5042 Chronic combined systolic (congestive) and diastolic (congestive) heart failure: Secondary | ICD-10-CM

## 2018-04-01 DIAGNOSIS — I428 Other cardiomyopathies: Secondary | ICD-10-CM | POA: Diagnosis not present

## 2018-04-01 DIAGNOSIS — I429 Cardiomyopathy, unspecified: Secondary | ICD-10-CM | POA: Diagnosis not present

## 2018-04-01 MED ORDER — GADOBENATE DIMEGLUMINE 529 MG/ML IV SOLN
23.0000 mL | Freq: Once | INTRAVENOUS | Status: AC | PRN
  Administered 2018-04-01: 23 mL via INTRAVENOUS

## 2018-04-03 ENCOUNTER — Other Ambulatory Visit (HOSPITAL_COMMUNITY): Payer: Self-pay | Admitting: *Deleted

## 2018-04-03 ENCOUNTER — Ambulatory Visit (HOSPITAL_COMMUNITY): Payer: Medicaid Other | Attending: Internal Medicine

## 2018-04-03 DIAGNOSIS — I5042 Chronic combined systolic (congestive) and diastolic (congestive) heart failure: Secondary | ICD-10-CM | POA: Insufficient documentation

## 2018-04-06 ENCOUNTER — Emergency Department (HOSPITAL_COMMUNITY): Payer: Medicaid Other

## 2018-04-06 ENCOUNTER — Emergency Department (HOSPITAL_COMMUNITY)
Admission: EM | Admit: 2018-04-06 | Discharge: 2018-04-06 | Disposition: A | Discharge status: Home / Self Care | Payer: Medicaid Other | Attending: Emergency Medicine | Admitting: Emergency Medicine

## 2018-04-06 ENCOUNTER — Encounter (HOSPITAL_COMMUNITY): Payer: Self-pay | Admitting: Emergency Medicine

## 2018-04-06 DIAGNOSIS — Z7984 Long term (current) use of oral hypoglycemic drugs: Secondary | ICD-10-CM | POA: Insufficient documentation

## 2018-04-06 DIAGNOSIS — Z79899 Other long term (current) drug therapy: Secondary | ICD-10-CM | POA: Insufficient documentation

## 2018-04-06 DIAGNOSIS — M5489 Other dorsalgia: Secondary | ICD-10-CM | POA: Insufficient documentation

## 2018-04-06 DIAGNOSIS — Z87891 Personal history of nicotine dependence: Secondary | ICD-10-CM | POA: Diagnosis not present

## 2018-04-06 DIAGNOSIS — I5042 Chronic combined systolic (congestive) and diastolic (congestive) heart failure: Secondary | ICD-10-CM | POA: Insufficient documentation

## 2018-04-06 DIAGNOSIS — I252 Old myocardial infarction: Secondary | ICD-10-CM | POA: Diagnosis not present

## 2018-04-06 DIAGNOSIS — I428 Other cardiomyopathies: Secondary | ICD-10-CM | POA: Insufficient documentation

## 2018-04-06 DIAGNOSIS — I110 Hypertensive heart disease with heart failure: Secondary | ICD-10-CM | POA: Insufficient documentation

## 2018-04-06 DIAGNOSIS — Z91010 Allergy to peanuts: Secondary | ICD-10-CM | POA: Diagnosis not present

## 2018-04-06 DIAGNOSIS — M549 Dorsalgia, unspecified: Secondary | ICD-10-CM

## 2018-04-06 LAB — I-STAT CHEM 8, ED
BUN: 11 mg/dL (ref 8–23)
Calcium, Ion: 1.08 mmol/L — ABNORMAL LOW (ref 1.15–1.40)
Chloride: 106 mmol/L (ref 98–111)
Creatinine, Ser: 0.9 mg/dL (ref 0.61–1.24)
Glucose, Bld: 114 mg/dL — ABNORMAL HIGH (ref 70–99)
HEMATOCRIT: 43 % (ref 39.0–52.0)
HEMOGLOBIN: 14.6 g/dL (ref 13.0–17.0)
POTASSIUM: 3.3 mmol/L — AB (ref 3.5–5.1)
Sodium: 138 mmol/L (ref 135–145)
TCO2: 21 mmol/L — ABNORMAL LOW (ref 22–32)

## 2018-04-06 MED ORDER — IBUPROFEN 600 MG PO TABS: 600.0000 mg | ORAL_TABLET | Freq: Three times a day (TID) | ORAL | 0 refills | Status: DC | PRN

## 2018-04-06 MED ORDER — DIAZEPAM 5 MG PO TABS
5.0000 mg | ORAL_TABLET | Freq: Once | ORAL | Status: AC
  Administered 2018-04-06: 5 mg via ORAL
  Filled 2018-04-06: qty 1

## 2018-04-06 MED ORDER — KETOROLAC TROMETHAMINE 60 MG/2ML IM SOLN
60.0000 mg | Freq: Once | INTRAMUSCULAR | Status: AC
  Administered 2018-04-06: 60 mg via INTRAMUSCULAR
  Filled 2018-04-06: qty 2

## 2018-04-06 MED ORDER — CYCLOBENZAPRINE HCL 10 MG PO TABS: 10.0000 mg | ORAL_TABLET | Freq: Three times a day (TID) | ORAL | 0 refills | Status: DC | PRN

## 2018-04-06 MED ORDER — PREDNISONE 20 MG PO TABS
60.0000 mg | ORAL_TABLET | Freq: Once | ORAL | Status: AC
Start: 2018-04-06 — End: 2018-04-06
  Administered 2018-04-06: 60 mg via ORAL
  Filled 2018-04-06: qty 3

## 2018-04-06 MED ORDER — IOPAMIDOL (ISOVUE-370) INJECTION 76%
INTRAVENOUS | Status: AC
  Administered 2018-04-06: 100 mL
  Filled 2018-04-06: qty 100

## 2018-04-06 NOTE — ED Notes (Signed)
Resting quietly on stretcher watching TV - states pain "better"

## 2018-04-06 NOTE — ED Notes (Signed)
Family at bedside. 

## 2018-04-06 NOTE — ED Triage Notes (Signed)
Reports having muscle spasms in upper back that started this morning.  Took tramadol with little relief.

## 2018-04-06 NOTE — ED Notes (Signed)
Reports onset of upper back pain - worse with movement and use of left arm - onset at approx 0030 this am - took tramadol (previous prescribed for different issue); no neuro deficits

## 2018-04-06 NOTE — ED Provider Notes (Signed)
[facility] EMERGENCY DEPARTMENT Provider Note   CSN: [id number] Arrival date & time: 04/06/18  5681     History   Chief Complaint Chief Complaint  Patient presents with  . Back Pain    HPI Jonathan Dickson is a 62 y.o. male.  HPI 62 year old male presents the emergency department acute onset right upper thoracic pain while turning over in bed last night.  He states his pain is worse with movement and worse with turning of his head to the right.  No chest pain or shortness of breath.  His pain is moderate in severity.  He states he is having spasms in his right upper back as well.  He took a tramadol last night without improvement in his symptoms.  Denies weakness in his arms and legs.  No fevers or chills.  No abdominal pain.  Denies nausea vomiting diarrhea.  No chest pain or chest tightness.  No neck or jaw pain.     Past Medical History:  Diagnosis Date  . Abnormal TSH 02/2016  . Anxiety   . Cervical disc disease   . Chronic combined systolic and diastolic CHF (congestive heart failure) ([place])    a. 01/2016 Echo: EF 25%, inf AK, diffuse sev HK, Gr1 DD, mild MR.  . Depression   . Hyperglycemia   . Hypertension   . Hypertensive heart disease   . Myocardial infarction [facility])    ??  maybe  . NICM (nonischemic cardiomyopathy) ([place])    a. 01/2016 Echo: EF 25%, inf AK, diffuse sev HK, Gr1 DD, mild MR; b. 01/2016 MV: EF 22%, no isch/infarct;  c. 02/2016 Cath: Nl cors, EF 35-45%.  . Pre-diabetes   . Seizures ([place])    with penicillin    Patient Active Problem List   Diagnosis Date Noted  . Claudication in peripheral vascular disease ([place]) 12/31/2017  . Decreased hearing of both ears 11/10/2017  . Cervical disc disease with myelopathy 07/10/2016  . Chronic combined systolic and diastolic CHF (congestive heart failure) ([place])   . Acute on chronic combined systolic and diastolic congestive heart failure ([place]) 03/12/2016  . NICM (nonischemic cardiomyopathy) ([place])  03/12/2016  . Abnormal TSH 03/12/2016  . Hyperglycemia 03/12/2016  . Chest pain 03/12/2016  . Hypertensive heart disease 03/12/2016  . Congestive dilated cardiomyopathy ([place]) 03/11/2016  . Left ventricular dysfunction 03/06/2016  . Cervical radiculitis 04/19/2015  . Essential hypertension 09/27/2014    Past Surgical History:  Procedure Laterality Date  . ANTERIOR CERVICAL DECOMP/DISCECTOMY FUSION N/A 07/10/2016   Procedure: ANTERIOR CERVICAL DECOMPRESSION FUSION CERVICAL 4-5, CERVICAL 5-6, CERVICAL 6-7 WITH INSTRUMENTATION AND ALLOGRAFT;  Surgeon: [name], MD;  Location: [place];  Service: Orthopedics;  Laterality: N/A;  . CARDIAC CATHETERIZATION N/A 03/11/2016   Procedure: Left Heart Cath and Coronary Angiography;  Surgeon: [name], MD;  Location: [place] CV LAB;  Service: Cardiovascular;  Laterality: N/A;  . Thumb surgery Right         Home Medications    Prior to Admission medications   Medication Sig Start Date End Date Taking? Authorizing Provider  amitriptyline (ELAVIL) 25 MG tablet Take 1 tablet (25 mg total) by mouth at bedtime as needed for sleep. 03/04/18   Ladell Pier, MD  carvedilol (COREG) 25 MG tablet Take 1 tablet (25 mg total) by mouth 2 (two) times daily. 12/10/17   Lorretta Harp, MD  cholecalciferol (VITAMIN D) 1000 units tablet Take 1,000 Units by mouth daily.    [provider]  loratadine (CLARITIN) 10 MG tablet Take 1 tablet (10 mg total) by mouth daily. 03/21/17   Ladell Pier, MD  metFORMIN (GLUCOPHAGE XR) 500 MG 24 hr tablet Take 1 tablet (500 mg total) by mouth daily with breakfast. 01/15/18   Ladell Pier, MD  sacubitril-valsartan (ENTRESTO) 97-103 MG Take 1 tablet by mouth 2 (two) times daily. 03/11/18   Bensimhon, Shaune Pascal, MD  sildenafil (VIAGRA) 25 MG tablet Take 1 tablet (25 mg total) by mouth daily as needed for erectile dysfunction. 11/10/17   Ladell Pier, MD  traMADol-acetaminophen (ULTRACET) 37.5-325  MG tablet Take 1 tablet by mouth every 6 (six) hours as needed.    [provider]    Family History Family History  Problem Relation Age of Onset  . Diabetes Mother   . Diabetes Sister     Social History Social History   Tobacco Use  . Smoking status: Former Smoker    Packs/day: 1.50    Years: 8.00    Pack years: 12.00    Last attempt to quit: 03/15/2003    Years since quitting: 15.0  . Smokeless tobacco: Never Used  Substance Use Topics  . Alcohol use: No    Comment: none since 2004  . Drug use: No    Comment: former  none since 2004     Allergies   Peanut-containing drug products; Penicillins; and Decadron [dexamethasone]   Review of Systems Review of Systems  All other systems reviewed and are negative.    Physical Exam Updated Vital Signs BP (!) 156/98   Pulse 84   Temp (!) 97.5 F (36.4 C) (Oral)   Resp 18   Ht 5\' 6"  (1.676 m)   Wt 74.4 kg (164 lb)   SpO2 94%   BMI 26.47 kg/m   Physical Exam  Constitutional: He is oriented to person, place, and time. He appears well-developed and well-nourished.  HENT:  Head: Normocephalic and atraumatic.  Eyes: EOM are normal.  Neck: Normal range of motion.  Cardiovascular: Normal rate, regular rhythm, normal heart sounds and intact distal pulses.  Pulmonary/Chest: Effort normal and breath sounds normal. No respiratory distress.  Abdominal: Soft. He exhibits no distension. There is no tenderness.  Musculoskeletal: Normal range of motion.  No thoracic or lumbar point tenderness.  Area of point tenderness of his right parathoracic region just medial to his right scapula.  Normal right radial pulse.  Full range of motion of right shoulder and right elbow.  Full range of motion of left shoulder and left elbow.  Normal grip strength bilaterally.  Neurological: He is alert and oriented to person, place, and time.  Skin: Skin is warm and dry.  Psychiatric: He has a normal mood and affect. Judgment normal.    Nursing note and vitals reviewed.    ED Treatments / Results  Labs (all labs ordered are listed, but only abnormal results are displayed) Labs Reviewed - No data to display  EKG None  Radiology No results found.  Procedures Procedures (including critical care time)  Medications Ordered in ED Medications  diazepam (VALIUM) tablet 5 mg (5 mg Oral Given 04/06/18 0744)  predniSONE (DELTASONE) tablet 60 mg (60 mg Oral Given 04/06/18 0744)  ketorolac (TORADOL) injection 60 mg (60 mg Intramuscular Given 04/06/18 0743)     Initial Impression / Assessment and Plan / ED Course  I have reviewed the triage vital signs and the nursing notes.  Pertinent labs & imaging results that were available  during my care of the patient were reviewed by me and considered in my medical decision making (see chart for details).     Initial chest x-ray concerning for subtle abnormality in the right lower lobe.  CT angios negative for pulmonary embolism.  Suspect atelectasis.  Clinically this is more of a musculoskeletal related pain.  May have a component of pleurisy.  Low suspicion for pneumonia.  Primary care follow-up.  Patient understands return to the ER for new or worsening symptoms   Final Clinical Impressions(s) / ED Diagnoses   Final diagnoses:  None    ED Discharge Orders    None       Jola Schmidt, MD 04/06/18 1223

## 2018-04-22 MED FILL — $VIAGRA 25 MG TABLET: 25 MG | 30 days supply | Qty: 10 | Fill #4

## 2018-05-04 ENCOUNTER — Other Ambulatory Visit (HOSPITAL_COMMUNITY): Payer: Self-pay | Admitting: Internal Medicine

## 2018-05-07 ENCOUNTER — Ambulatory Visit (HOSPITAL_COMMUNITY)
Admission: RE | Admit: 2018-05-07 | Discharge: 2018-05-07 | Disposition: A | Discharge status: Home / Self Care | Payer: Medicaid Other | Source: Ambulatory Visit | Attending: Internal Medicine | Admitting: Internal Medicine

## 2018-05-07 VITALS — BP 98/71 | HR 61 | Wt 165.2 lb

## 2018-05-07 DIAGNOSIS — I5042 Chronic combined systolic (congestive) and diastolic (congestive) heart failure: Secondary | ICD-10-CM | POA: Insufficient documentation

## 2018-05-07 DIAGNOSIS — Z7984 Long term (current) use of oral hypoglycemic drugs: Secondary | ICD-10-CM | POA: Diagnosis not present

## 2018-05-07 DIAGNOSIS — I43 Cardiomyopathy in diseases classified elsewhere: Secondary | ICD-10-CM

## 2018-05-07 DIAGNOSIS — Z87891 Personal history of nicotine dependence: Secondary | ICD-10-CM | POA: Diagnosis not present

## 2018-05-07 DIAGNOSIS — Z791 Long term (current) use of non-steroidal anti-inflammatories (NSAID): Secondary | ICD-10-CM | POA: Insufficient documentation

## 2018-05-07 DIAGNOSIS — E854 Organ-limited amyloidosis: Secondary | ICD-10-CM

## 2018-05-07 DIAGNOSIS — Z79899 Other long term (current) drug therapy: Secondary | ICD-10-CM | POA: Diagnosis not present

## 2018-05-07 DIAGNOSIS — I252 Old myocardial infarction: Secondary | ICD-10-CM | POA: Diagnosis not present

## 2018-05-07 DIAGNOSIS — I4589 Other specified conduction disorders: Secondary | ICD-10-CM | POA: Diagnosis not present

## 2018-05-07 DIAGNOSIS — R42 Dizziness and giddiness: Secondary | ICD-10-CM | POA: Insufficient documentation

## 2018-05-07 DIAGNOSIS — Z888 Allergy status to other drugs, medicaments and biological substances status: Secondary | ICD-10-CM | POA: Insufficient documentation

## 2018-05-07 DIAGNOSIS — F329 Major depressive disorder, single episode, unspecified: Secondary | ICD-10-CM | POA: Diagnosis not present

## 2018-05-07 DIAGNOSIS — F419 Anxiety disorder, unspecified: Secondary | ICD-10-CM | POA: Insufficient documentation

## 2018-05-07 DIAGNOSIS — Z880 Allergy status to penicillin: Secondary | ICD-10-CM | POA: Diagnosis not present

## 2018-05-07 DIAGNOSIS — E119 Type 2 diabetes mellitus without complications: Secondary | ICD-10-CM | POA: Diagnosis not present

## 2018-05-07 DIAGNOSIS — R0602 Shortness of breath: Secondary | ICD-10-CM | POA: Diagnosis not present

## 2018-05-07 DIAGNOSIS — Z91010 Allergy to peanuts: Secondary | ICD-10-CM | POA: Insufficient documentation

## 2018-05-07 DIAGNOSIS — I110 Hypertensive heart disease with heart failure: Secondary | ICD-10-CM | POA: Diagnosis not present

## 2018-05-07 DIAGNOSIS — Z833 Family history of diabetes mellitus: Secondary | ICD-10-CM | POA: Diagnosis not present

## 2018-05-07 LAB — BASIC METABOLIC PANEL
Anion gap: 11 (ref 5–15)
BUN: 20 mg/dL (ref 8–23)
CHLORIDE: 102 mmol/L (ref 98–111)
CO2: 23 mmol/L (ref 22–32)
CREATININE: 1.77 mg/dL — AB (ref 0.61–1.24)
Calcium: 9 mg/dL (ref 8.9–10.3)
GFR calc Af Amer: 45 mL/min — ABNORMAL LOW (ref >60)
GFR calc non Af Amer: 39 mL/min — ABNORMAL LOW (ref >60)
GLUCOSE: 113 mg/dL — AB (ref 70–99)
Potassium: 3.4 mmol/L — ABNORMAL LOW (ref 3.5–5.1)
SODIUM: 136 mmol/L (ref 135–145)

## 2018-05-07 MED ORDER — SACUBITRIL-VALSARTAN 49-51 MG PO TABS: 1.0000 | ORAL_TABLET | Freq: Two times a day (BID) | ORAL | 3 refills | Status: DC

## 2018-05-07 MED ORDER — CARVEDILOL 12.5 MG PO TABS: 12.5000 mg | ORAL_TABLET | Freq: Two times a day (BID) | ORAL | 6 refills | Status: DC

## 2018-05-07 NOTE — Addendum Note (Signed)
Encounter addended by: Jolaine Artist, MD on: 05/07/2018 9:49 PM  Actions taken: Sign clinical note

## 2018-05-07 NOTE — Progress Notes (Addendum)
ADVANCED HF CLINIC CONSULT NOTE  Referring Physician: Dr [name]  Primary Care: Dr Wynetta Emery at [facility]  Primary Cardiologist: Dr [name]  HPI:  Jonathan Dickson is a 62 year old with a HTN, previous polysubstance abuse - including heroin (quit 2004) and depression. He was referred by Dr. [name] in 5/19 for further evaluation of his HF and dropping EF 50-55% -->30-35%.  Had Myoview in 5/17 with EF 22%. Subsequently underwent cath 6/17. EF 40-45%. Normal coronaries    PYP scan obtained which was positive (Visual grade 2. Quantitative 1.57).   Since last visit, underwent cMRI which showed EF 28% and suggestive of cardiac amyloidosis .  At last visit we increased Entresto to 97/103 bid. Often gets lightheaded and dizzy after he takes it. 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 Can do all basic activity without too much problem. But if he walks 3 blocks will get SOB. Also SOB with steps on occasion. No CP, edema, orthopnea or PND.     CPX 04/03/18 FVC 2.77 (83%)    FEV1 2.37 (91%)     FEV1/FVC 86 (109%)     MVV 76 (61%)  Resting HR: 83 Peak HR: 125  (80% age predicted max HR) BP rest: 144/84 BP peak: 168/72 Peak VO2: 23.1 (76% predicted peak VO2) VE/VCO2 slope: 41 OUES: 2.18 Peak RER: 0.93 VE/MVV: 88% O2pulse: 14  (100% predicted O2pulse)  cMRI 04/01/18  1. Mild LVE with mild LVH septal thickness 13 mm. Diffuse hypokinesis worse in the septum apex and lateral walls EF 28%  2. 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 Mild LVH Diffuse hypokinesis. Doppler   parameters are consistent with abnormal left ventricular   relaxation (grade 1 diastolic dysfunction).  ECHO EF 08/2017  Left ventricle: EF 50%  to 55%.     Past Medical History:  Diagnosis Date  . Abnormal TSH 02/2016  . Anxiety   . Cervical disc disease   . Chronic combined systolic and diastolic CHF (congestive heart failure) ([place])    a. 01/2016 Echo: EF 25%, inf AK, diffuse sev HK, Gr1 DD, mild Jonathan.  . Depression   . Hyperglycemia   . Hypertension   . Hypertensive heart disease   . Myocardial infarction [facility])    ??  maybe  . NICM (nonischemic cardiomyopathy) ([place])    a. 01/2016 Echo: EF 25%, inf AK, diffuse sev HK, Gr1 DD, mild Jonathan; b. 01/2016 MV: EF 22%, no isch/infarct;  c. 02/2016 Cath: Nl cors, EF 35-45%.  . Pre-diabetes   . Seizures ([place])    with penicillin    Current Outpatient Medications  Medication Sig Dispense Refill  . amitriptyline (ELAVIL) 25 MG tablet Take 1 tablet (25 mg total) by mouth at bedtime as needed for sleep. 30 tablet 3  . Aromatic Inhalants (VICKS VAPOINHALER IN) Inhale 1 Dose into the lungs as needed (congestion).    . carvedilol (COREG) 25 MG tablet Take 1 tablet (25 mg total) by mouth 2 (two) times daily. 180 tablet 3  . cholecalciferol (VITAMIN D) 1000 units tablet Take 1,000 Units by mouth daily.    Marland Kitchen ibuprofen (ADVIL,MOTRIN) 600 MG tablet Take 1 tablet (600 mg total) by mouth every 8 (eight) hours as needed. 15 tablet 0  .  loratadine (CLARITIN) 10 MG tablet Take 1 tablet (10 mg total) by mouth daily. 30 tablet 3  . metFORMIN (GLUCOPHAGE XR) 500 MG 24 hr tablet Take 1 tablet (500 mg total) by mouth daily with breakfast. 90 tablet 0  . sacubitril-valsartan (ENTRESTO) 97-103 MG Take 1 tablet by mouth 2 (two) times daily. 60 tablet 6  . sildenafil (VIAGRA) 25 MG tablet Take 1 tablet (25 mg total) by mouth daily as needed for erectile dysfunction. 10 tablet 6   No current facility-administered medications for this encounter.     Allergies  Allergen Reactions  . Peanut-Containing Drug Products Swelling  . Penicillins Other (See Comments)    CONVULSIONS Has patient had a PCN reaction causing  immediate rash, facial/tongue/throat swelling, SOB or lightheadedness with hypotension: No Has patient had a PCN reaction causing severe rash involving mucus membranes or skin necrosis: No Has patient had a PCN reaction that required hospitalization No Has patient had a PCN reaction occurring within the last 10 years: No If all of the above answers are "NO", then may proceed with Cephalosporin use.   . Decadron [Dexamethasone] Itching      Social History   Socioeconomic History  . Marital status: Legally Separated    Spouse name: Not on file  . Number of children: 3  . Years of education: Not on file  . Highest education level: Not on file  Occupational History  . Not on file  Social Needs  . Financial resource strain: Not on file  . Food insecurity:    Worry: Not on file    Inability: Not on file  . Transportation needs:    Medical: Not on file    Non-medical: Not on file  Tobacco Use  . Smoking status: Former Smoker    Packs/day: 1.50    Years: 8.00    Pack years: 12.00    Last attempt to quit: 03/15/2003    Years since quitting: 15.1  . Smokeless tobacco: Never Used  Substance and Sexual Activity  . Alcohol use: No    Comment: none since 2004  . Drug use: No    Comment: former  none since 2004  . Sexual activity: Not on file  Lifestyle  . Physical activity:    Days per week: Not on file    Minutes per session: Not on file  . Stress: Not on file  Relationships  . Social connections:    Talks on phone: Not on file    Gets together: Not on file    Attends religious service: Not on file    Active member of club or organization: Not on file    Attends meetings of clubs or organizations: Not on file    Relationship status: Not on file  . Intimate partner violence:    Fear of current or ex partner: Not on file    Emotionally abused: Not on file    Physically abused: Not on file    Forced sexual activity: Not on file  Other Topics Concern  . Not on file  Social  History Narrative  . Not on file      Family History  Problem Relation Age of Onset  . Diabetes Mother   . Diabetes Sister     Vitals:   05/07/18 1013  BP: 98/71  Pulse: 61  SpO2: 99%  Weight: 74.9 kg    PHYSICAL EXAM: General:  Walked into clinic. No resp difficulty Mildly diaphorectic HEENT: normal Neck: supple. no JVD. Carotids  2+ bilat; no bruits. No lymphadenopathy or thryomegaly appreciated. Cor: PMI nondisplaced. Regular rate & rhythm. No rubs, gallops or murmurs. Lungs: clear Abdomen: soft, nontender, nondistended. No hepatosplenomegaly. No bruits or masses. Good bowel sounds. Extremities: no cyanosis, clubbing, rash, edema Neuro: alert & orientedx3, cranial nerves grossly intact. moves all 4 extremities w/o difficulty. Affect pleasant  ASSESSMENT & PLAN: 1. Chronic Systolic Heart Failure, [facility]. 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 Will need SPEP and IFE. If negative. Proceed with tafamadis therapy - NYHA II-III - CPX 5/19 pVO2 23 but slope 41 - Volume status ok.  - BP low. Will drop Entresto back to 49/51 bid and carvedilol back to 12.5 bid (given chronotropic incompetence on CPX) - Check labs today - Will refer to EP to discuss ICD. May be reasonable to wait until we see response to tafamadis before making final decision however.   2.DMII - Stable On metformin - Consider jardiance when BP imrpoved  3. HTN - Low today. Med changes as above.    Jonathan Bickers, MD  10:55 AM

## 2018-05-07 NOTE — Patient Instructions (Signed)
Decrease Carvedilol to 12.5 mg Twice daily (you can cut the 25 mg tabs in half)  Decrease Entresto to 49/51 mg Twice daily (you CAN NOT cut the 97/103 tabs so in half so we have sent you in a new prescription for 49/51 mg tablets)  Labs today  You have been referred to EP to discuss getting a defibrillator   Your physician recommends that you schedule a follow-up appointment in: 3 months

## 2018-05-09 LAB — MULTIPLE MYELOMA PANEL, SERUM
ALBUMIN/GLOB SERPL: 1 (ref 0.7–1.7)
ALPHA2 GLOB SERPL ELPH-MCNC: 1.1 g/dL — AB (ref 0.4–1.0)
Albumin SerPl Elph-Mcnc: 3.4 g/dL (ref 2.9–4.4)
Alpha 1: 0.3 g/dL (ref 0.0–0.4)
B-Globulin SerPl Elph-Mcnc: 1.2 g/dL (ref 0.7–1.3)
GAMMA GLOB SERPL ELPH-MCNC: 1.1 g/dL (ref 0.4–1.8)
GLOBULIN, TOTAL: 3.6 g/dL (ref 2.2–3.9)
IGA: 236 mg/dL (ref 61–437)
IgG (Immunoglobin G), Serum: 961 mg/dL (ref 700–1600)
IgM (Immunoglobulin M), Srm: 101 mg/dL (ref 20–172)
Total Protein ELP: 7 g/dL (ref 6.0–8.5)

## 2018-05-10 ENCOUNTER — Encounter (HOSPITAL_COMMUNITY): Payer: Self-pay | Admitting: *Deleted

## 2018-05-10 ENCOUNTER — Inpatient Hospital Stay (HOSPITAL_COMMUNITY)
Admission: EM | Admit: 2018-05-10 | Discharge: 2018-05-14 | DRG: 291 | Disposition: A | Discharge status: Home / Self Care | Payer: Medicaid Other | Attending: Internal Medicine | Admitting: Internal Medicine

## 2018-05-10 ENCOUNTER — Emergency Department (HOSPITAL_COMMUNITY): Payer: Medicaid Other

## 2018-05-10 ENCOUNTER — Other Ambulatory Visit: Payer: Self-pay

## 2018-05-10 DIAGNOSIS — I110 Hypertensive heart disease with heart failure: Principal | ICD-10-CM | POA: Diagnosis present

## 2018-05-10 DIAGNOSIS — J9601 Acute respiratory failure with hypoxia: Secondary | ICD-10-CM | POA: Diagnosis present

## 2018-05-10 DIAGNOSIS — E119 Type 2 diabetes mellitus without complications: Secondary | ICD-10-CM

## 2018-05-10 DIAGNOSIS — F419 Anxiety disorder, unspecified: Secondary | ICD-10-CM | POA: Diagnosis present

## 2018-05-10 DIAGNOSIS — Z79899 Other long term (current) drug therapy: Secondary | ICD-10-CM

## 2018-05-10 DIAGNOSIS — I248 Other forms of acute ischemic heart disease: Secondary | ICD-10-CM | POA: Diagnosis present

## 2018-05-10 DIAGNOSIS — R7989 Other specified abnormal findings of blood chemistry: Secondary | ICD-10-CM

## 2018-05-10 DIAGNOSIS — I428 Other cardiomyopathies: Secondary | ICD-10-CM

## 2018-05-10 DIAGNOSIS — E854 Organ-limited amyloidosis: Secondary | ICD-10-CM | POA: Diagnosis present

## 2018-05-10 DIAGNOSIS — I2699 Other pulmonary embolism without acute cor pulmonale: Secondary | ICD-10-CM | POA: Diagnosis present

## 2018-05-10 DIAGNOSIS — I420 Dilated cardiomyopathy: Secondary | ICD-10-CM | POA: Diagnosis present

## 2018-05-10 DIAGNOSIS — Z87891 Personal history of nicotine dependence: Secondary | ICD-10-CM

## 2018-05-10 DIAGNOSIS — Z91010 Allergy to peanuts: Secondary | ICD-10-CM

## 2018-05-10 DIAGNOSIS — F329 Major depressive disorder, single episode, unspecified: Secondary | ICD-10-CM | POA: Diagnosis present

## 2018-05-10 DIAGNOSIS — I43 Cardiomyopathy in diseases classified elsewhere: Secondary | ICD-10-CM | POA: Diagnosis present

## 2018-05-10 DIAGNOSIS — N179 Acute kidney failure, unspecified: Secondary | ICD-10-CM | POA: Diagnosis present

## 2018-05-10 DIAGNOSIS — I5043 Acute on chronic combined systolic (congestive) and diastolic (congestive) heart failure: Secondary | ICD-10-CM | POA: Diagnosis present

## 2018-05-10 DIAGNOSIS — I509 Heart failure, unspecified: Secondary | ICD-10-CM

## 2018-05-10 DIAGNOSIS — I252 Old myocardial infarction: Secondary | ICD-10-CM

## 2018-05-10 DIAGNOSIS — I119 Hypertensive heart disease without heart failure: Secondary | ICD-10-CM | POA: Diagnosis present

## 2018-05-10 DIAGNOSIS — Z981 Arthrodesis status: Secondary | ICD-10-CM

## 2018-05-10 DIAGNOSIS — T501X5A Adverse effect of loop [high-ceiling] diuretics, initial encounter: Secondary | ICD-10-CM | POA: Diagnosis not present

## 2018-05-10 DIAGNOSIS — Z885 Allergy status to narcotic agent status: Secondary | ICD-10-CM

## 2018-05-10 DIAGNOSIS — R778 Other specified abnormalities of plasma proteins: Secondary | ICD-10-CM | POA: Diagnosis present

## 2018-05-10 DIAGNOSIS — Z880 Allergy status to penicillin: Secondary | ICD-10-CM

## 2018-05-10 LAB — CBC WITH DIFFERENTIAL/PLATELET
ABS IMMATURE GRANULOCYTES: 0 10*3/uL (ref 0.0–0.1)
BASOS ABS: 0.1 10*3/uL (ref 0.0–0.1)
BASOS PCT: 1 %
Eosinophils Absolute: 0.3 10*3/uL (ref 0.0–0.7)
Eosinophils Relative: 4 %
HCT: 45.5 % (ref 39.0–52.0)
Hemoglobin: 14.4 g/dL (ref 13.0–17.0)
IMMATURE GRANULOCYTES: 0 %
Lymphocytes Relative: 66 %
Lymphs Abs: 4.4 10*3/uL — ABNORMAL HIGH (ref 0.7–4.0)
MCH: 27.9 pg (ref 26.0–34.0)
MCHC: 31.6 g/dL (ref 30.0–36.0)
MCV: 88 fL (ref 78.0–100.0)
MONOS PCT: 7 %
Monocytes Absolute: 0.5 10*3/uL (ref 0.1–1.0)
NEUTROS PCT: 22 %
Neutro Abs: 1.5 10*3/uL — ABNORMAL LOW (ref 1.7–7.7)
PLATELETS: 222 10*3/uL (ref 150–400)
RBC: 5.17 MIL/uL (ref 4.22–5.81)
RDW: 13.2 % (ref 11.5–15.5)
WBC: 6.7 10*3/uL (ref 4.0–10.5)

## 2018-05-10 LAB — COMPREHENSIVE METABOLIC PANEL
ALBUMIN: 3.3 g/dL — AB (ref 3.5–5.0)
ALT: 20 U/L (ref 0–44)
AST: 39 U/L (ref 15–41)
Alkaline Phosphatase: 66 U/L (ref 38–126)
Anion gap: 13 (ref 5–15)
BUN: 18 mg/dL (ref 8–23)
CHLORIDE: 106 mmol/L (ref 98–111)
CO2: 19 mmol/L — AB (ref 22–32)
Calcium: 8.9 mg/dL (ref 8.9–10.3)
Creatinine, Ser: 1.34 mg/dL — ABNORMAL HIGH (ref 0.61–1.24)
GFR calc non Af Amer: 55 mL/min — ABNORMAL LOW (ref >60)
Glucose, Bld: 159 mg/dL — ABNORMAL HIGH (ref 70–99)
POTASSIUM: 4 mmol/L (ref 3.5–5.1)
SODIUM: 138 mmol/L (ref 135–145)
Total Bilirubin: 1 mg/dL (ref 0.3–1.2)
Total Protein: 6.4 g/dL — ABNORMAL LOW (ref 6.5–8.1)

## 2018-05-10 LAB — I-STAT TROPONIN, ED: TROPONIN I, POC: 1.18 ng/mL — AB (ref 0.00–0.08)

## 2018-05-10 LAB — MAGNESIUM: Magnesium: 2.5 mg/dL — ABNORMAL HIGH (ref 1.7–2.4)

## 2018-05-10 MED ORDER — HEPARIN BOLUS VIA INFUSION
4000.0000 [IU] | Freq: Once | INTRAVENOUS | Status: AC
  Administered 2018-05-10: 4000 [IU] via INTRAVENOUS
  Filled 2018-05-10: qty 4000

## 2018-05-10 MED ORDER — FUROSEMIDE 10 MG/ML IJ SOLN: 40.0000 mg | Freq: Once | INTRAMUSCULAR | Status: DC

## 2018-05-10 MED ORDER — HEPARIN (PORCINE) IN NACL 100-0.45 UNIT/ML-% IJ SOLN
1000.0000 [IU]/h | INTRAMUSCULAR | Status: DC
Start: 2018-05-10 — End: 2018-05-11
  Administered 2018-05-10: 1000 [IU]/h via INTRAVENOUS
  Filled 2018-05-10: qty 250

## 2018-05-10 MED ORDER — ASPIRIN 81 MG PO CHEW: 324.0000 mg | CHEWABLE_TABLET | Freq: Once | ORAL | Status: DC

## 2018-05-10 NOTE — ED Notes (Signed)
Dr Gilford Raid informed of troponin results 1.18

## 2018-05-10 NOTE — Progress Notes (Signed)
ANTICOAGULATION CONSULT NOTE - Initial Consult  Pharmacy Consult for heparin  Indication: chest pain/ACS  Allergies  Allergen Reactions  . Peanut-Containing Drug Products Swelling  . Penicillins Other (See Comments)    CONVULSIONS Has patient had a PCN reaction causing immediate rash, facial/tongue/throat swelling, SOB or lightheadedness with hypotension: No Has patient had a PCN reaction causing severe rash involving mucus membranes or skin necrosis: No Has patient had a PCN reaction that required hospitalization No Has patient had a PCN reaction occurring within the last 10 years: No If all of the above answers are "NO", then may proceed with Cephalosporin use.   . Decadron [Dexamethasone] Itching    Patient Measurements: Height: 5\' 6"  (167.6 cm) Weight: 162 lb (73.5 kg) IBW/kg (Calculated) : 63.8   Vital Signs: Temp: 97.8 F (36.6 C) (08/11 2200) Temp Source: Temporal (08/11 2200) BP: 160/109 (08/11 2200) Pulse Rate: 129 (08/11 2200)  Labs: No results for input(s): HGB, HCT, PLT, APTT, LABPROT, INR, HEPARINUNFRC, HEPRLOWMOCWT, CREATININE, CKTOTAL, CKMB, TROPONINI in the last 72 hours.  Estimated Creatinine Clearance: 38.5 mL/min (A) (by C-G formula based on SCr of 1.77 mg/dL (H)).   Medical History: Past Medical History:  Diagnosis Date  . Abnormal TSH 02/2016  . Anxiety   . Cervical disc disease   . Chronic combined systolic and diastolic CHF (congestive heart failure) ([place])    a. 01/2016 Echo: EF 25%, inf AK, diffuse sev HK, Gr1 DD, mild MR.  . Depression   . Hyperglycemia   . Hypertension   . Hypertensive heart disease   . Myocardial infarction [facility])    ??  maybe  . NICM (nonischemic cardiomyopathy) ([place])    a. 01/2016 Echo: EF 25%, inf AK, diffuse sev HK, Gr1 DD, mild MR; b. 01/2016 MV: EF 22%, no isch/infarct;  c. 02/2016 Cath: Nl cors, EF 35-45%.  . Pre-diabetes   . Seizures ([place])    with penicillin      Assessment: 62 yo male here with SOB and  elevated troponin. Pharmacy consulted to dose heparin for r/o ACS.   Goal of Therapy:  Heparin level 0.3-0.7 units/ml Monitor platelets by anticoagulation protocol: Yes   Plan:   -Heparin bolus 4000 units IV followed by 1000 units/hr -Heparin level in 8 hours and daily wth CBC daily  Hildred Laser, PharmD Clinical Pharmacist Please check Amion for pharmacy contact number 05/10/2018,10:37 PM

## 2018-05-10 NOTE — ED Provider Notes (Signed)
[facility] EMERGENCY DEPARTMENT Provider Note   CSN: [id number] Arrival date & time: 05/10/18  2153     History   Chief Complaint Chief Complaint  Patient presents with  . Allergic Reaction    HPI Jonathan Dickson is a 62 y.o. male.  HPI 56-year male with history of dilated cardiomyopathy, CHF with EF of 30-35%, hypertension, seizures, and peanut allergy presents to the emergency department today for eval of acute onset shortness of breath.  Symptoms started approx 1 hour prior to ED arrival.  EMS reports that when they arrived patient was very dyspneic.  Room air saturations of 84%.  Reports that he was moving minimal air only at the lung apices.  They were concerned for possible anaphylaxis therefore gave epinephrine, Solu-Medrol, 4 g of magnesium, and DuoNeb and transported patient to this facility for evaluation.  Patient reports he was feeling well prior to onset of symptoms.  Feeling better after receiving IM epinephrine.  Continues to have shortness of breath.  States he has never had similar episode in the past. Does have peanut allergy though no exposure to nuts tonight.  Has never been intubated in the past to his knowledge and never for anaphylaxis. No rash. No N/V. Tachycardic prior to Epi. Never hypotensive.   Past Medical History:  Diagnosis Date  . Abnormal TSH 02/2016  . Anxiety   . Cervical disc disease   . Chronic combined systolic and diastolic CHF (congestive heart failure) ([place])    a. 01/2016 Echo: EF 25%, inf AK, diffuse sev HK, Gr1 DD, mild MR.  . Depression   . Hyperglycemia   . Hypertension   . Hypertensive heart disease   . Myocardial infarction [facility])    ??  maybe  . NICM (nonischemic cardiomyopathy) ([place])    a. 01/2016 Echo: EF 25%, inf AK, diffuse sev HK, Gr1 DD, mild MR; b. 01/2016 MV: EF 22%, no isch/infarct;  c. 02/2016 Cath: Nl cors, EF 35-45%.  . Pre-diabetes   . Seizures ([place])    with penicillin    Patient Active Problem List   Diagnosis Date Noted  . Claudication in peripheral vascular disease ([place]) 12/31/2017  . Decreased hearing of both ears 11/10/2017  . Cervical disc disease with myelopathy 07/10/2016  . Chronic combined systolic and diastolic CHF (congestive heart failure) ([place])   . Acute on chronic combined systolic and diastolic congestive heart failure ([place]) 03/12/2016  . NICM (nonischemic cardiomyopathy) ([place]) 03/12/2016  . Abnormal TSH 03/12/2016  . Hyperglycemia 03/12/2016  . Chest pain 03/12/2016  . Hypertensive heart disease 03/12/2016  . Congestive dilated cardiomyopathy ([place]) 03/11/2016  . Left ventricular dysfunction 03/06/2016  . Cervical radiculitis 04/19/2015  . Essential hypertension 09/27/2014    Past Surgical History:  Procedure Laterality Date  . ANTERIOR CERVICAL DECOMP/DISCECTOMY FUSION N/A 07/10/2016   Procedure: ANTERIOR CERVICAL DECOMPRESSION FUSION CERVICAL 4-5, CERVICAL 5-6, CERVICAL 6-7 WITH INSTRUMENTATION AND ALLOGRAFT;  Surgeon: [name], MD;  Location: [facility];  Service: Orthopedics;  Laterality: N/A;  . CARDIAC CATHETERIZATION N/A 03/11/2016   Procedure: Left Heart Cath and Coronary Angiography;  Surgeon: [name], MD;  Location: [facility] CV LAB;  Service: Cardiovascular;  Laterality: N/A;  . Thumb surgery Right         Home Medications    Prior to Admission medications   Medication Sig Start Date End Date Taking? Authorizing Provider  amitriptyline (ELAVIL) 25 MG tablet Take 1 tablet (25 mg total) by mouth at bedtime as needed  for sleep. 03/04/18  Yes Ladell Pier, MD  carvedilol (COREG) 12.5 MG tablet Take 1 tablet (12.5 mg total) by mouth 2 (two) times daily. 05/07/18  Yes Bensimhon, Shaune Pascal, MD  cholecalciferol (VITAMIN D) 1000 units tablet Take 1,000 Units by mouth daily.   Yes [provider]  loratadine (CLARITIN) 10 MG tablet Take 1 tablet (10 mg total) by mouth daily. 03/21/17  Yes Ladell Pier, MD  metFORMIN (GLUCOPHAGE XR) 500  MG 24 hr tablet Take 1 tablet (500 mg total) by mouth daily with breakfast. 01/15/18  Yes Ladell Pier, MD  sacubitril-valsartan (ENTRESTO) 49-51 MG Take 1 tablet by mouth 2 (two) times daily. 05/07/18  Yes Bensimhon, Shaune Pascal, MD  sildenafil (VIAGRA) 25 MG tablet Take 1 tablet (25 mg total) by mouth daily as needed for erectile dysfunction. 11/10/17  Yes Ladell Pier, MD  ibuprofen (ADVIL,MOTRIN) 600 MG tablet Take 1 tablet (600 mg total) by mouth every 8 (eight) hours as needed. Patient not taking: Reported on 05/10/2018 04/06/18   Jola Schmidt, MD    Family History Family History  Problem Relation Age of Onset  . Diabetes Mother   . Diabetes Sister     Social History Social History   Tobacco Use  . Smoking status: Former Smoker    Packs/day: 1.50    Years: 8.00    Pack years: 12.00    Last attempt to quit: 03/15/2003    Years since quitting: 15.1  . Smokeless tobacco: Never Used  Substance Use Topics  . Alcohol use: No    Comment: none since 2004  . Drug use: No    Comment: former  none since 2004     Allergies   Peanut-containing drug products; Penicillins; and Decadron [dexamethasone]   Review of Systems Review of Systems  Constitutional: Negative for chills and fever.  HENT: Negative for congestion.   Respiratory: Positive for shortness of breath. Negative for cough.   Cardiovascular: Negative for chest pain, palpitations and leg swelling.  Gastrointestinal: Negative for abdominal pain, diarrhea, nausea and vomiting.  Genitourinary: Negative for dysuria and hematuria.  Musculoskeletal: Negative for back pain and neck pain.  Skin: Negative for rash.  Neurological: Negative for weakness, numbness and headaches.     Physical Exam Updated Vital Signs BP (!) 154/109   Pulse (!) 109   Temp 97.8 F (36.6 C) (Temporal)   Resp (!) 36   Ht 5\' 6"  (1.676 m)   Wt 73.5 kg   SpO2 93%   BMI 26.15 kg/m   Physical Exam  Constitutional: No distress.  HENT:    Head: Normocephalic and atraumatic.  Oropharyngeal exam unremrkable with no edema or swelling. Patent airway. No stridor.   Eyes: Conjunctivae are normal. Right eye exhibits no discharge. Left eye exhibits no discharge.  Neck: Normal range of motion. No tracheal deviation present.  Cardiovascular: Regular rhythm, normal heart sounds and intact distal pulses.  tachycardic  Pulmonary/Chest: No stridor. He is in respiratory distress. He has no wheezes. He has rales (minimal left base).  On bipap  Abdominal: Soft. Bowel sounds are normal. He exhibits no distension. There is no tenderness. There is no rebound and no guarding.  Musculoskeletal: He exhibits no edema or deformity.  Neurological: He is alert. He exhibits normal muscle tone.  Follows commands briskly  Skin: Capillary refill takes less than 2 seconds. No rash noted. He is not diaphoretic.  Psychiatric: He has a normal mood and affect.  ED Treatments / Results  Labs (all labs ordered are listed, but only abnormal results are displayed) Labs Reviewed  MAGNESIUM - Abnormal; Notable for the following components:      Result Value   Magnesium 2.5 (*)    All other components within normal limits  CBC WITH DIFFERENTIAL/PLATELET - Abnormal; Notable for the following components:   Neutro Abs 1.5 (*)    Lymphs Abs 4.4 (*)    All other components within normal limits  BRAIN NATRIURETIC PEPTIDE - Abnormal; Notable for the following components:   B Natriuretic Peptide 2,019.9 (*)    All other components within normal limits  COMPREHENSIVE METABOLIC PANEL - Abnormal; Notable for the following components:   CO2 19 (*)    Glucose, Bld 159 (*)    Creatinine, Ser 1.34 (*)    Total Protein 6.4 (*)    Albumin 3.3 (*)    GFR calc non Af Amer 55 (*)    All other components within normal limits  I-STAT TROPONIN, ED - Abnormal; Notable for the following components:   Troponin i, poc 1.18 (*)    All other components within normal limits   HEPARIN LEVEL (UNFRACTIONATED)  CBC  I-STAT TROPONIN, ED    EKG EKG Interpretation  Date/Time:  Sunday May 10 2018 23:55:38 EDT Ventricular Rate:  110 PR Interval:    QRS Duration: 113 QT Interval:  370 QTC Calculation: 501 R Axis:   64 Text Interpretation:  Sinus tachycardia Probable left atrial enlargement Borderline intraventricular conduction delay Prolonged QT interval No significant change since last tracing Confirmed by Isla Pence [phone]) on 05/10/2018 11:58:35 PM   Radiology Dg Chest Portable 1 View  Result Date: 05/10/2018 CLINICAL DATA:  Shortness of breath tonight. EXAM: PORTABLE CHEST 1 VIEW COMPARISON:  04/06/2018 FINDINGS: Slightly shallow inspiration. Mild cardiac enlargement. Bilateral perihilar infiltration may indicate edema or multifocal pneumonia. This is progressing since previous study. Possible small left pleural effusion. No pneumothorax. Mediastinal contours appear intact. Postoperative changes in the cervical spine. Degenerative changes in the shoulders. IMPRESSION: Bilateral perihilar infiltrates, progressing since previous study. Edema versus pneumonia. Possible small left pleural effusion. Electronically Signed   By: Lucienne Capers M.D.   On: 05/10/2018 22:44    Procedures Procedures (including critical care time)  Medications Ordered in ED Medications  aspirin chewable tablet 324 mg (324 mg Oral Not Given 05/10/18 2309)  heparin ADULT infusion 100 units/mL (25000 units/246mL sodium chloride 0.45%) (1,000 Units/hr Intravenous New Bag/Given 05/10/18 2307)  heparin bolus via infusion 4,000 Units (4,000 Units Intravenous Bolus from Bag 05/10/18 2307)  furosemide (LASIX) injection 40 mg (40 mg Intravenous Given 05/11/18 0026)     Initial Impression / Assessment and Plan / ED Course  I have reviewed the triage vital signs and the nursing notes.  Pertinent labs & imaging results that were available during my care of the patient were reviewed by me  and considered in my medical decision making (see chart for details).    49-year male with history of dilated cardiomyopathy, CHF with EF of 30-35%, hypertension, seizures, and peanut allergy presents to the emergency department today for evaluation of acute onset shortness of breath.    Patient afebrile, tachycardic at presentation and hypoxic.  Transition to BiPAP.  Reportedly looking much better than when EMS initially evaluated him.  He had no other signs of anaphylaxis besides shortness of breath however was very ill-appearing prompting EMS to give epinephrine, Solu-Medrol, magnesium.  Patient denies any history of anaphylaxis in the  past.  I do not feel his presentation is consistent with anaphylaxis at this time.  He has no oral swelling or pharyngeal swelling.  No rashes.  No wheezing.  He does have crackling in bilateral lung bases.  History of heart failure.  Differential includes ACS, acute valvular insufficiency (though no prominent murmur heard), flash pulmonary edema from CHF exacerbation.  Reports he was feeling well prior to this with no infectious signs or symptoms or cough therefore doubt pneumonia.  ECG obtained that shows sinus tachycardia with no ST or T wave changes to suggest acute ischemia.  No significant changes when compared to prior.  Labs obtained remarkable for troponin elevation of 1.1.  Discussed with patient and given his history of CAD we will start heparin.  No history of intracranial bleeds or GI bleeds in the past.  No hematochezia or melena recently.  Patient consented for heparin.  Aspirin also given. Repeat EKG with no ischemic changes.  Could be demand secondary to hypoxia and intramuscular epinephrine given prehospital.  CMP is remarkable for bicarb of 19, renal function similar to patient's baseline.  Troponin elevated and BNP greater than 2000. IV lasix given. Patient has no leg swelling.  No history of DVT or PE.  I think that PE is less likely at this time.   Chest x-ray does show signs of edema.  Less likely pneumonia.  Trialed off of BiPAP and became hypoxic again therefore placed back on BiPAP.  Evaluate by cardiology.  Hospitalist team will admit to stepdown unit. On multiple reevaluations pt continues to deny CP, is alert and oriented, denies any pain.   Case and plan of care discussed with Dr. Gilford Raid.  Final Clinical Impressions(s) / ED Diagnoses   Final diagnoses:  Acute respiratory failure with hypoxia [facility])    ED Discharge Orders    None       Corrie Dandy, MD 05/11/18 4585    Isla Pence, MD 05/13/18 [phone]

## 2018-05-10 NOTE — ED Triage Notes (Signed)
Pt from home c/o sudden onset SOB an hour ago. EMS reported absent lung sound and slight stridor, very audible on their arrival. EMS gave 5mg  albuterol, 0.5mg  atrovent, 125mg  solumedrol, 2g mag, and 0.3mg  epi IM. Room air sats 84%, placed on CPAP. Pt diaphoretic on arrival, A&O x4

## 2018-05-10 NOTE — Progress Notes (Signed)
Arrived by EMS on CPAP. Placed on BIPAP. MD at bedside.

## 2018-05-11 ENCOUNTER — Other Ambulatory Visit: Payer: Self-pay

## 2018-05-11 ENCOUNTER — Telehealth: Payer: Self-pay

## 2018-05-11 ENCOUNTER — Other Ambulatory Visit (HOSPITAL_COMMUNITY): Payer: Medicaid Other

## 2018-05-11 ENCOUNTER — Encounter (HOSPITAL_COMMUNITY): Payer: Self-pay

## 2018-05-11 ENCOUNTER — Inpatient Hospital Stay (HOSPITAL_COMMUNITY): Payer: Medicaid Other

## 2018-05-11 DIAGNOSIS — E119 Type 2 diabetes mellitus without complications: Secondary | ICD-10-CM | POA: Diagnosis not present

## 2018-05-11 DIAGNOSIS — I5021 Acute systolic (congestive) heart failure: Secondary | ICD-10-CM | POA: Diagnosis not present

## 2018-05-11 DIAGNOSIS — Z885 Allergy status to narcotic agent status: Secondary | ICD-10-CM | POA: Diagnosis not present

## 2018-05-11 DIAGNOSIS — I5043 Acute on chronic combined systolic (congestive) and diastolic (congestive) heart failure: Secondary | ICD-10-CM | POA: Diagnosis present

## 2018-05-11 DIAGNOSIS — Z79899 Other long term (current) drug therapy: Secondary | ICD-10-CM | POA: Diagnosis not present

## 2018-05-11 DIAGNOSIS — Z91010 Allergy to peanuts: Secondary | ICD-10-CM | POA: Diagnosis not present

## 2018-05-11 DIAGNOSIS — N179 Acute kidney failure, unspecified: Secondary | ICD-10-CM | POA: Diagnosis present

## 2018-05-11 DIAGNOSIS — I43 Cardiomyopathy in diseases classified elsewhere: Secondary | ICD-10-CM | POA: Diagnosis present

## 2018-05-11 DIAGNOSIS — R7989 Other specified abnormal findings of blood chemistry: Secondary | ICD-10-CM | POA: Diagnosis present

## 2018-05-11 DIAGNOSIS — J9601 Acute respiratory failure with hypoxia: Secondary | ICD-10-CM | POA: Diagnosis present

## 2018-05-11 DIAGNOSIS — I248 Other forms of acute ischemic heart disease: Secondary | ICD-10-CM | POA: Diagnosis present

## 2018-05-11 DIAGNOSIS — I420 Dilated cardiomyopathy: Secondary | ICD-10-CM | POA: Diagnosis present

## 2018-05-11 DIAGNOSIS — I340 Nonrheumatic mitral (valve) insufficiency: Secondary | ICD-10-CM | POA: Diagnosis not present

## 2018-05-11 DIAGNOSIS — Z87891 Personal history of nicotine dependence: Secondary | ICD-10-CM | POA: Diagnosis not present

## 2018-05-11 DIAGNOSIS — I509 Heart failure, unspecified: Secondary | ICD-10-CM

## 2018-05-11 DIAGNOSIS — Z880 Allergy status to penicillin: Secondary | ICD-10-CM | POA: Diagnosis not present

## 2018-05-11 DIAGNOSIS — I110 Hypertensive heart disease with heart failure: Secondary | ICD-10-CM | POA: Diagnosis present

## 2018-05-11 DIAGNOSIS — I428 Other cardiomyopathies: Secondary | ICD-10-CM | POA: Diagnosis present

## 2018-05-11 DIAGNOSIS — E854 Organ-limited amyloidosis: Secondary | ICD-10-CM | POA: Diagnosis present

## 2018-05-11 DIAGNOSIS — Z981 Arthrodesis status: Secondary | ICD-10-CM | POA: Diagnosis not present

## 2018-05-11 DIAGNOSIS — I2699 Other pulmonary embolism without acute cor pulmonale: Secondary | ICD-10-CM | POA: Diagnosis present

## 2018-05-11 DIAGNOSIS — T501X5A Adverse effect of loop [high-ceiling] diuretics, initial encounter: Secondary | ICD-10-CM | POA: Diagnosis not present

## 2018-05-11 DIAGNOSIS — R778 Other specified abnormalities of plasma proteins: Secondary | ICD-10-CM | POA: Diagnosis present

## 2018-05-11 DIAGNOSIS — I252 Old myocardial infarction: Secondary | ICD-10-CM | POA: Diagnosis not present

## 2018-05-11 DIAGNOSIS — F419 Anxiety disorder, unspecified: Secondary | ICD-10-CM | POA: Diagnosis present

## 2018-05-11 DIAGNOSIS — F329 Major depressive disorder, single episode, unspecified: Secondary | ICD-10-CM | POA: Diagnosis present

## 2018-05-11 LAB — COMPREHENSIVE METABOLIC PANEL
ALT: 22 U/L (ref 0–44)
AST: 45 U/L — AB (ref 15–41)
Albumin: 3.7 g/dL (ref 3.5–5.0)
Alkaline Phosphatase: 70 U/L (ref 38–126)
Anion gap: 14 (ref 5–15)
BILIRUBIN TOTAL: 1.5 mg/dL — AB (ref 0.3–1.2)
BUN: 19 mg/dL (ref 8–23)
CHLORIDE: 104 mmol/L (ref 98–111)
CO2: 18 mmol/L — ABNORMAL LOW (ref 22–32)
Calcium: 9 mg/dL (ref 8.9–10.3)
Creatinine, Ser: 1.15 mg/dL (ref 0.61–1.24)
Glucose, Bld: 143 mg/dL — ABNORMAL HIGH (ref 70–99)
POTASSIUM: 4.5 mmol/L (ref 3.5–5.1)
Sodium: 136 mmol/L (ref 135–145)
TOTAL PROTEIN: 7 g/dL (ref 6.5–8.1)

## 2018-05-11 LAB — HEMOGLOBIN A1C
Hgb A1c MFr Bld: 6.3 % — ABNORMAL HIGH (ref 4.8–5.6)
MEAN PLASMA GLUCOSE: 134.11 mg/dL

## 2018-05-11 LAB — CBC
HEMATOCRIT: 48.3 % (ref 39.0–52.0)
Hemoglobin: 15.4 g/dL (ref 13.0–17.0)
MCH: 27.8 pg (ref 26.0–34.0)
MCHC: 31.9 g/dL (ref 30.0–36.0)
MCV: 87.2 fL (ref 78.0–100.0)
PLATELETS: 196 10*3/uL (ref 150–400)
RBC: 5.54 MIL/uL (ref 4.22–5.81)
RDW: 13.3 % (ref 11.5–15.5)
WBC: 8.8 10*3/uL (ref 4.0–10.5)

## 2018-05-11 LAB — I-STAT TROPONIN, ED: Troponin i, poc: 0.67 ng/mL (ref 0.00–0.08)

## 2018-05-11 LAB — HEPARIN LEVEL (UNFRACTIONATED): Heparin Unfractionated: 0.59 IU/mL (ref 0.30–0.70)

## 2018-05-11 LAB — TROPONIN I
TROPONIN I: 0.65 ng/mL — AB (ref <0.03)
TROPONIN I: 0.56 ng/mL — AB (ref <0.03)
Troponin I: 0.48 ng/mL (ref <0.03)

## 2018-05-11 LAB — BRAIN NATRIURETIC PEPTIDE: B NATRIURETIC PEPTIDE 5: 2019.9 pg/mL — AB (ref 0.0–100.0)

## 2018-05-11 MED ORDER — SODIUM CHLORIDE 0.9% FLUSH
3.0000 mL | Freq: Two times a day (BID) | INTRAVENOUS | Status: DC
  Administered 2018-05-12 – 2018-05-13 (×5): 3 mL via INTRAVENOUS

## 2018-05-11 MED ORDER — CARVEDILOL 12.5 MG PO TABS
12.5000 mg | ORAL_TABLET | Freq: Two times a day (BID) | ORAL | Status: DC
  Administered 2018-05-11 – 2018-05-13 (×5): 12.5 mg via ORAL
  Filled 2018-05-11 (×5): qty 1

## 2018-05-11 MED ORDER — SODIUM CHLORIDE 0.9% FLUSH
3.0000 mL | Freq: Two times a day (BID) | INTRAVENOUS | Status: DC
  Administered 2018-05-11 – 2018-05-12 (×2): 3 mL via INTRAVENOUS

## 2018-05-11 MED ORDER — NITROGLYCERIN IN D5W 200-5 MCG/ML-% IV SOLN
0.0000 ug/min | Freq: Once | INTRAVENOUS | Status: AC
  Administered 2018-05-11: 5 ug/min via INTRAVENOUS
  Filled 2018-05-11: qty 250

## 2018-05-11 MED ORDER — SACUBITRIL-VALSARTAN 49-51 MG PO TABS: 1.0000 | ORAL_TABLET | Freq: Two times a day (BID) | ORAL | Status: DC

## 2018-05-11 MED ORDER — SODIUM CHLORIDE 0.9 % IV SOLN
INTRAVENOUS | Status: DC
  Administered 2018-05-12: 06:00:00 via INTRAVENOUS

## 2018-05-11 MED ORDER — HYDRALAZINE HCL 20 MG/ML IJ SOLN: 5.0000 mg | Freq: Three times a day (TID) | INTRAMUSCULAR | Status: DC | PRN

## 2018-05-11 MED ORDER — NITROGLYCERIN IN D5W 200-5 MCG/ML-% IV SOLN: 0.0000 ug/min | INTRAVENOUS | Status: DC

## 2018-05-11 MED ORDER — HYDRALAZINE HCL 20 MG/ML IJ SOLN: 5.0000 mg | INTRAMUSCULAR | Status: DC | PRN

## 2018-05-11 MED ORDER — FUROSEMIDE 10 MG/ML IJ SOLN
40.0000 mg | Freq: Two times a day (BID) | INTRAMUSCULAR | Status: DC
  Administered 2018-05-11 – 2018-05-12 (×4): 40 mg via INTRAVENOUS
  Filled 2018-05-11 (×5): qty 4

## 2018-05-11 MED ORDER — FUROSEMIDE 10 MG/ML IJ SOLN
40.0000 mg | Freq: Once | INTRAMUSCULAR | Status: AC
  Administered 2018-05-11: 40 mg via INTRAVENOUS
  Filled 2018-05-11: qty 4

## 2018-05-11 MED ORDER — PERFLUTREN LIPID MICROSPHERE
1.0000 mL | INTRAVENOUS | Status: AC | PRN
  Administered 2018-05-11: 2 mL via INTRAVENOUS
  Filled 2018-05-11: qty 10

## 2018-05-11 MED ORDER — ASPIRIN 81 MG PO CHEW
81.0000 mg | CHEWABLE_TABLET | Freq: Every day | ORAL | Status: DC
  Administered 2018-05-12 – 2018-05-14 (×3): 81 mg via ORAL
  Filled 2018-05-11 (×4): qty 1

## 2018-05-11 MED ORDER — SACUBITRIL-VALSARTAN 49-51 MG PO TABS
1.0000 | ORAL_TABLET | Freq: Two times a day (BID) | ORAL | Status: DC
  Administered 2018-05-12 – 2018-05-13 (×3): 1 via ORAL
  Filled 2018-05-11 (×3): qty 1

## 2018-05-11 NOTE — ED Notes (Signed)
Pt began to have labored breathing again, called Respiratory who agreed and placed pt back on bipap.  Provider notified and agreed.

## 2018-05-11 NOTE — H&P (Signed)
History and Physical   Jonathan Dickson JQB:[id number] DOB: June 30, 1956 DOA: 05/10/2018  PCP: Ladell Pier, MD  Chief Complaint: shortness of breath   HPI: this is a 62 year old man with medical problems including chronic systolic ( reduced EF) heart failure-nonischemic, hypertension, non-insulin-dependent diabetes who presents with acute onset of dyspnea.  He follows with a heart failure service in the outpatient setting, reports that within the past few days he's had increasing nausea and vomiting, reports 10 pound weight gain in the past few weeks, reports PND.  Shortness of breath came on while laying flat tonight, occurred suddenly. He denies having chest pain. Dyspnea not alleviated by sitting up or use a fan. Therefore, called EMS. EMS treated with epinephrine, as well as steroids and magnesium, positive airway pressure was provided.  He denies hemoptysis, productive cough, recent antibiotics, syncope.  At baseline the patient reports living alone, denies recent substance abuse.  Retired Administrator, arts. Educated through the 10th grade. Enjoys jazz music.  ED Course: emergency department tachycardic up to 409, systolic blood pressure between 140-160 mm Hg, O2 sat nadir of 84% on arrival, was initiated on BiPAP.  Labs marked for creatinine of 1.4, troponin 1.2, EKG without frank ST segment elevation on most recent EKG tracing. Chest x-ray with bilateral perihilar infiltrates consistent with edema versus pneumonia. BNP of 2000.  Cardiology team consulted and recommended trending troponin, as well as treating patient for a daily, 6 heart failure with diuresis. Emergency medicine team ordered CT PE study to further evaluate for PE.  Was initiated on heparin drip as well as nitroglycerin for afterload reduction.  Review of Systems: A complete ROS was obtained; pertinent positives negatives are denoted in the HPI. Otherwise, all systems are negative.   Past Medical History:  Diagnosis Date  .  Abnormal TSH 02/2016  . Anxiety   . Cervical disc disease   . Chronic combined systolic and diastolic CHF (congestive heart failure) ([place])    a. 01/2016 Echo: EF 25%, inf AK, diffuse sev HK, Gr1 DD, mild MR.  . Depression   . Hyperglycemia   . Hypertension   . Hypertensive heart disease   . Myocardial infarction [facility])    ??  maybe  . NICM (nonischemic cardiomyopathy) ([place])    a. 01/2016 Echo: EF 25%, inf AK, diffuse sev HK, Gr1 DD, mild MR; b. 01/2016 MV: EF 22%, no isch/infarct;  c. 02/2016 Cath: Nl cors, EF 35-45%.  . Pre-diabetes   . Seizures ([place])    with penicillin   Social History   Socioeconomic History  . Marital status: Legally Separated    Spouse name: Not on file  . Number of children: 3  . Years of education: Not on file  . Highest education level: Not on file  Occupational History  . Not on file  Social Needs  . Financial resource strain: Not on file  . Food insecurity:    Worry: Not on file    Inability: Not on file  . Transportation needs:    Medical: Not on file    Non-medical: Not on file  Tobacco Use  . Smoking status: Former Smoker    Packs/day: 1.50    Years: 8.00    Pack years: 12.00    Last attempt to quit: 03/15/2003    Years since quitting: 15.1  . Smokeless tobacco: Never Used  Substance and Sexual Activity  . Alcohol use: No    Comment: none since 2004  . Drug use: No  Comment: former  none since 2004  . Sexual activity: Not on file  Lifestyle  . Physical activity:    Days per week: Not on file    Minutes per session: Not on file  . Stress: Not on file  Relationships  . Social connections:    Talks on phone: Not on file    Gets together: Not on file    Attends religious service: Not on file    Active member of club or organization: Not on file    Attends meetings of clubs or organizations: Not on file    Relationship status: Not on file  . Intimate partner violence:    Fear of current or ex partner: Not on file    Emotionally  abused: Not on file    Physically abused: Not on file    Forced sexual activity: Not on file  Other Topics Concern  . Not on file  Social History Narrative  . Not on file   Family History  Problem Relation Age of Onset  . Diabetes Mother   . Diabetes Sister     Physical Exam: Vitals:   05/10/18 2330 05/10/18 2337 05/10/18 2345 05/11/18 0000  BP:  (!) 158/114 (!) 158/108 (!) 154/109  Pulse: (!) 111 (!) 110 (!) 109 (!) 109  Resp: (!) 26 (!) 26 (!) 31 (!) 36  Temp:      TempSrc:      SpO2: 98%  94% 93%  Weight:      Height:       General: Appears mildly anxious, but is alert and oriented, black man ENT: Grossly normal hearing, MMM. Cardiovascular: Rate tachycardic, JVD present. No M/R/G. No LE edema.  Respiratory: b/l crackles posterior lung fields halfway up back, able to speak in complete sentences - during interview off of Bipap Abdomen: Soft, mildly distended Skin: No rash or induration seen on limited exam. Musculoskeletal: Grossly normal tone BUE/BLE. Appropriate ROM.  Psychiatric: Grossly normal mood and affect. Neurologic: Moves all extremities in coordinated fashion.  I have personally reviewed the following labs, culture data, and imaging studies.  Assessment/Plan:  #Acute respiratory distress, likely secondary to acute decompensated systolic (reduced EF) heart failure Course: on admission - weight gain, abdominal distension, PND with acute dyspnea; BNP > 2,000, CXR with perihilar infiltrates / edema Plan: Will assess response to IV furosemide given in ED, goal 1-2 liters negative over 24 hours. Continue nitroglycerin gtt for afterload reduction + BIPAP for respiratory support while volume optimizing (patient reports helpful). Daily weights.  Continuing Entresto and BB per cardiology team recommendations.  #Other problems: -Elevated troponin: suspect demand in setting of acute illness, but will trend, continue heparin gtt until cardiology relays no longer  concerned for ACS -Concern for PE: CT PE study ordered by emergency medicine team, cardiology team agrees with this diagnostic test, will await results -AKI: suspect cardiorenal, Cr of 1.34 on admission, monitor response to diuresis  DVT prophylaxis: on hep gtt Code Status: full code Disposition Plan: Anticipate D/C home in 2-5 days Consults called: cardiology team consulted by emergency medicine team Admission status: admit to hospital medicine, step down level of care given need for BIPAP   Cheri Rous, MD [facility] [phone]  If 7PM-7AM, please contact night-coverage [email or web address] Password [email or web address]

## 2018-05-11 NOTE — Consult Note (Signed)
[facility] Consult Note   Primary Physician:  Dr. Wynetta Emery Primary Cardiologist:   Pierre Bali  Reason for Consultation: Acute onset of shortness of breath  HPI:    Jonathan Jonathan is seen today for evaluation of shortness of breath at the request of Dr. Corrie Dandy. 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  The patient admits to recent weight gain. He believes that in the past 6 months he has gained at least 20 pounds. The previous night he had to sleep almost sitting up. His recent cardiac workup had revealed a drop in his LV systolic function (from an EF of 50% to 30-35%). 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 He denied any chest pain, palpitations or lower leg edema. He has NYHA class III symptoms at baseline.  Yesterday he had acute onset of resting shortness of breath. EMS found the patient to be severely tachypneic. He was treated with epinephrine, Solu-Medrol and nebulizers. In the ED he was persistently hypoxic. O2 saturations improved when he was placed on BiPAP and given IV furosemide.  His labs revealed: serum creatinine 1.34-1.77, GFR 45-60, Troponin I 1.18, BNP 2019.9  ECG 05/10/2018: sinus tachycardia, rate 110 bpm, poor R-wave progression, nonspecific T-wave abnormalities    Cardiopulmonary exercise testing 04/03/2018 Conclusion: The interpretation of this test is limited due to submaximal effort during the exercise. Based on available data, exercise testing with gas exchange demonstrates mild to moderate HF limitation with elevated VE/VCO2 slope (41) indicating poor short-term prognosis. Cardiac MRI 04/01/18 1. Mild LVE with mild LVH septal thickness 13 mm. Diffuse hypokinesis  worse in the septum apex and lateral walls EF 28% 2. 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 Wall thickness was   increased in a pattern of mild LVH. Systolic function was   moderately to severely reduced. The estimated ejection fraction   was in the range of 30% to 35%. Diffuse hypokinesis. Doppler   parameters are consistent with abnormal left ventricular   relaxation (grade 1 diastolic dysfunction). Impressions: - Moderate to severe global reduction in LV systolic function; mild   LVH; mild diastolic dysfunction Echocardiogram 09/11/2017 Study Conclusions - Left ventricle: Wall thickness was increased in a pattern of   moderate LVH. Systolic function was normal. The estimated   ejection fraction was in the range of 50% to 55%. Wall motion was   normal; there were no regional wall motion abnormalities. Doppler   parameters are consistent with abnormal left ventricular   relaxation (grade 1 diastolic dysfunction). - Aortic valve: There was trivial regurgitation. - Mitral valve: There was mild regurgitation. Cardiac catheterization 03/11/2016 There is mild to moderate left ventricular systolic dysfunction. Angiographically normal coronary arteries with a codominant system Mild-moderately elevated LVEDP Myocardial perfusion imaging 02/23/2016 Myocardial perfusion is normal. This is a high risk study. Overall left ventricular systolic function was abnormal. LV cavity size is moderately enlarged. Nuclear stress EF: 22%. The left ventricular ejection fraction is severely decreased (<30%). There is no prior study for comparison.     Home Medications Prior to  Admission medications   Medication Sig  Start Date End Date Taking? Authorizing Provider  amitriptyline (ELAVIL) 25 MG tablet Take 1 tablet (25 mg total) by mouth at bedtime as needed for sleep. 03/04/18  Yes Ladell Pier, MD  carvedilol (COREG) 12.5 MG tablet Take 1 tablet (12.5 mg total) by mouth 2 (two) times daily. 05/07/18  Yes Bensimhon, Shaune Pascal, MD  cholecalciferol (VITAMIN D) 1000 units tablet Take 1,000 Units by mouth daily.   Yes [provider]  loratadine (CLARITIN) 10 MG tablet Take 1 tablet (10 mg total) by mouth daily. 03/21/17  Yes Ladell Pier, MD  metFORMIN (GLUCOPHAGE XR) 500 MG 24 hr tablet Take 1 tablet (500 mg total) by mouth daily with breakfast. 01/15/18  Yes Ladell Pier, MD  sacubitril-valsartan (ENTRESTO) 49-51 MG Take 1 tablet by mouth 2 (two) times daily. 05/07/18  Yes Bensimhon, Shaune Pascal, MD  sildenafil (VIAGRA) 25 MG tablet Take 1 tablet (25 mg total) by mouth daily as needed for erectile dysfunction. 11/10/17  Yes Ladell Pier, MD  ibuprofen (ADVIL,MOTRIN) 600 MG tablet Take 1 tablet (600 mg total) by mouth every 8 (eight) hours as needed. Patient not taking: Reported on 05/10/2018 04/06/18   Jola Schmidt, MD    Past Medical History: Past Medical History:  Diagnosis Date  . Abnormal TSH 02/2016  . Anxiety   . Cervical disc disease   . Chronic combined systolic and diastolic CHF (congestive heart failure) ([place])    a. 01/2016 Echo: EF 25%, inf AK, diffuse sev HK, Gr1 DD, mild MR.  . Depression   . Hyperglycemia   . Hypertension   . Hypertensive heart disease   . Myocardial infarction [facility])    ??  maybe  . NICM (nonischemic cardiomyopathy) ([place])    a. 01/2016 Echo: EF 25%, inf AK, diffuse sev HK, Gr1 DD, mild MR; b. 01/2016 MV: EF 22%, no isch/infarct;  c. 02/2016 Cath: Nl cors, EF 35-45%.  . Pre-diabetes   . Seizures ([place])    with penicillin    Past Surgical History: Past Surgical History:  Procedure Laterality Date  . ANTERIOR CERVICAL DECOMP/DISCECTOMY FUSION N/A  07/10/2016   Procedure: ANTERIOR CERVICAL DECOMPRESSION FUSION CERVICAL 4-5, CERVICAL 5-6, CERVICAL 6-7 WITH INSTRUMENTATION AND ALLOGRAFT;  Surgeon: [name], MD;  Location: [facility];  Service: Orthopedics;  Laterality: N/A;  . CARDIAC CATHETERIZATION N/A 03/11/2016   Procedure: Left Heart Cath and Coronary Angiography;  Surgeon: [name], MD;  Location: [facility];  Service: Cardiovascular;  Laterality: N/A;  . Thumb surgery Right     Family History: Family History  Problem Relation Age of Onset  . Diabetes Mother   . Diabetes Sister     Social History: Social History   Socioeconomic History  . Marital status: Legally Separated    Spouse name: Not on file  . Number of children: 3  . Years of education: Not on file  . Highest education level: Not on file  Occupational History  . Not on file  Social Needs  . Financial resource strain: Not on file  . Food insecurity:    Worry: Not on file    Inability: Not on file  . Transportation needs:    Medical: Not on file    Non-medical: Not on file  Tobacco Use  . Smoking status: Former Smoker    Packs/day: 1.50    Years: 8.00    Pack years: 12.00    Last attempt to quit:  03/15/2003    Years since quitting: 15.1  . Smokeless tobacco: Never Used  Substance and Sexual Activity  . Alcohol use: No    Comment: none since 2004  . Drug use: No    Comment: former  none since 2004  . Sexual activity: Not on file  Lifestyle  . Physical activity:    Days per week: Not on file    Minutes per session: Not on file  . Stress: Not on file  Relationships  . Social connections:    Talks on phone: Not on file    Gets together: Not on file    Attends religious service: Not on file    Active member of club or organization: Not on file    Attends meetings of clubs or organizations: Not on file    Relationship status: Not on file  Other Topics Concern  . Not on file  Social History Narrative  . Not on file     Allergies:  Allergies  Allergen Reactions  . Peanut-Containing Drug Products Swelling  . Penicillins Other (See Comments)    CONVULSIONS Has patient had a PCN reaction causing immediate rash, facial/tongue/throat swelling, SOB or lightheadedness with hypotension: No Has patient had a PCN reaction causing severe rash involving mucus membranes or skin necrosis: No Has patient had a PCN reaction that required hospitalization No Has patient had a PCN reaction occurring within the last 10 years: No If all of the above answers are "NO", then may proceed with Cephalosporin use.   . Decadron [Dexamethasone] Itching    Objective:    Vital Signs:   Temp:  [97.8 F (36.6 C)] 97.8 F (36.6 C) (08/11 2200) Pulse Rate:  [109-119] 109 (08/12 0000) Resp:  [26-40] 36 (08/12 0000) BP: (152-160)/(108-117) 154/109 (08/12 0000) SpO2:  [84 %-100 %] 93 % (08/12 0000) FiO2 (%):  [30 %] 30 % (08/12 0017) Weight:  [73.5 kg] 73.5 kg (08/11 2200)    Weight change: Filed Weights   05/10/18 2200  Weight: 73.5 kg    Intake/Output:  No intake or output data in the 24 hours ending 05/11/18 0045    Physical Exam    General:  Well appearing. Tachypneic HEENT: normal Neck: unable to assess JVP due to patient positioning (with BIPAP in place) . No lymphadenopathy or thyromegaly appreciated. Cor: PMI nondisplaced. Regular rate & rhythm. No rubs, gallops or murmurs. Lungs: Decreased air entry bilaterally. Occasional expiratory wheezes Abdomen: soft, nontender, mildly distended. No hepatosplenomegaly. No bruits or masses. Good bowel sounds. Extremities: no cyanosis, clubbing, rash, edema Neuro: alert & orientedx3, cranial nerves grossly intact. moves all 4 extremities w/o difficulty.  Affect pleasant, mildly uncomfortable    EKG    ECG 05/10/2018: sinus tachycardia, rate 110 bpm, poor R-wave progression, nonspecific T-wave abnormalities  Labs   Basic Metabolic Panel: Recent Labs  Lab  05/07/18 1118 05/10/18 2213  NA 136 138  K 3.4* 4.0  CL 102 106  CO2 23 19*  GLUCOSE 113* 159*  BUN 20 18  CREATININE 1.77* 1.34*  CALCIUM 9.0 8.9  MG  --  2.5*    Liver Function Tests: Recent Labs  Lab 05/10/18 2213  AST 39  ALT 20  ALKPHOS 66  BILITOT 1.0  PROT 6.4*  ALBUMIN 3.3*   No results for input(s): LIPASE, AMYLASE in the last 168 hours. No results for input(s): AMMONIA in the last 168 hours.  CBC: Recent Labs  Lab 05/10/18 2213  WBC 6.7  NEUTROABS 1.5*  HGB 14.4  HCT 45.5  MCV 88.0  PLT 222    Cardiac Enzymes: No results for input(s): CKTOTAL, CKMB, CKMBINDEX, TROPONINI in the last 168 hours.  BNP: BNP (last 3 results) Recent Labs    12/24/17 1129 02/16/18 1620 05/10/18 2213  BNP 6.9 16.3 2,019.9*    ProBNP (last 3 results) No results for input(s): PROBNP in the last 8760 hours.   CBG: No results for input(s): GLUCAP in the last 168 hours.  Coagulation Studies: No results for input(s): LABPROT, INR in the last 72 hours.   Imaging   Chest Portable 1 View - Result Date: 05/10/2018 FINDINGS: Slightly shallow inspiration. Mild cardiac enlargement. Bilateral perihilar infiltration may indicate edema or multifocal pneumonia. This is progressing since previous study. Possible small left pleural effusion. No pneumothorax. Mediastinal contours appear intact. Postoperative changes in the cervical spine. Degenerative changes in the shoulders. IMPRESSION: Bilateral perihilar infiltrates, progressing since previous study. Edema versus pneumonia. Possible small left pleural effusion.   Assessment/Plan   1. Worsening shortness of breath Differential diagnoses includes acute systolic heart failure / community acquired pneumonia / acute pulmonary embolism / ACS  - Agree with a CT angiogram to rule out PE given the sudden onset of symptoms. Continue IV heparin per pharmacy protocol. - Chest x-ray reveals perihilar infilterates - Check blood  cultures - Continue BiPAP as needed - Less likely to be ACS: ECG is without acute ischemic changes. Patient had normal coronaries on a cardiac cath in 2017. Troponin elevation could be from increased myocardial demand. Continue to trend cardiac biomarkers and obtain serial ECGs. Aspirin 81 mg daily.   2. Acute systolic heart failure The patient has a history of systolic heart failure. The BNP is elevated.  - Diuresis with IV boluses of furosemide. Goal 1-2 liters negative over the next 24 hours. - IV nitroglycerin to acutely reduce afterload.  - Continue carvedilol 12.5 mg twice daily - Continue Entresto and monitor renal function    Meade Maw, MD  05/11/2018, 12:45 AM  Cardiology Overnight Team Please contact [facility] for night-coverage after hours (4p -7a ) and weekends on [email or web address]

## 2018-05-11 NOTE — Progress Notes (Signed)
Placed patient back on BIPAP due to increased WOB, SOB.

## 2018-05-11 NOTE — Progress Notes (Signed)
Placed BIPAP on stand by for Trial off per MD.

## 2018-05-11 NOTE — Progress Notes (Signed)
PROGRESS NOTE    Jonathan Dickson  VHQ:[id number] DOB: 1955-11-25 DOA: 05/10/2018 PCP: Ladell Pier, MD    Brief Narrative: Patient is a pleasant 62 year old gentleman history of chronic systolic and diastolic heart failure with a EF of 30 to 35% from 2D echo of April 2018, recent cardiac MRI(04/01/2018) suggestive of cardiac amyloidosis, NYHA class III symptoms at baseline, she of hypertension presented to the ED with a weight gain of at least 20 pounds over the past 6 months, acute shortness of breath with severe tachypnea, elevated BNP of 2019, creatinine of 1.34 and requiring BiPAP and IV Lasix on presentation.  Patient also noted to have elevated troponins.  Patient placed on IV diuretics, heparin, nitroglycerin drip.  Cardiology consulted.   Assessment & Plan:   Principal Problem:   Acute respiratory failure with hypoxia (HCC) Active Problems:   Congestive dilated cardiomyopathy (HCC)   Acute on chronic combined systolic and diastolic congestive heart failure (HCC)   NICM (nonischemic cardiomyopathy) (HCC)   Hypertensive heart disease   Heart failure (HCC)   Elevated troponin   AKI (acute kidney injury) ([place])   #1 acute respiratory failure with hypoxia likely secondary to acute on chronic combined systolic and diastolic CHF exacerbation Patient presented with acute respiratory failure with hypoxia requiring BiPAP, elevated BNP.  Cardiac enzymes drawn were elevated.  Chest x-ray done with bilateral infiltrates likely secondary to CHF exacerbation.  Patient afebrile.  Patient with normal white count.  EKG on admission with sinus tachycardia with a rate of 110, poor R wave progression, nonspecific T wave abnormalities.  Patient with clinical improvement.  Patient received 2 doses of IV Lasix.  Total of 1.650 L since admission.  Current weight of 73.7 kg.  CT angiogram chest pending to rule out PE.  Repeat 2D echo.  Placed on Lasix 40 mg IV every 12 hours.  Continue Coreg, aspirin.   Continue Entresto.  Cardiology consulted and are following.  2.  Nonischemic cardiomyopathy Patient with recent cardiac MR done concerning for amyloidosis.  Continue current cardiac regimen of aspirin, Coreg, Entresto, diuretics.  Cardiology following.  3.  Elevated troponin Likely secondary to a demand ischemia from acute on chronic combined systolic and diastolic heart failure.  EKG with a sinus tachycardia with poor R wave progression.  Patient currently on IV heparin and IV nitroglycerin.  Cardiology following.  4.  Hypertension Continue Coreg and Entresto.  5.  Acute kidney injury Likely secondary to prerenal azotemia from acute CHF exacerbation.  Renal function improving with diuresis.  Monitor closely while on Entresto and diuretics.  Follow.  6.  Concern for PE CT PE protocol ordered per ED physician.  Patient was seen by cardiology team who agrees with this diagnostic test.  Results pending.  Follow.    DVT prophylaxis: Heparin Code Status: Full Family Communication: Updated patient.  No family at bedside. Disposition Plan: Remain in stepdown unit today.  Likely back home when medically stable and when okay with cardiology.   Consultants:   Cardiology: Dr. De Nurse 05/11/2018  Procedures:  CT angiogram chest pending  Chest x-ray 05/10/2018  Antimicrobials:   None   Subjective: Laying in bed at a 45 degree angle.  Patient states improvement with shortness of breath since presentation.  Denies any chest pain.  Currently off BiPAP.  Objective: Vitals:   05/11/18 0314 05/11/18 0448 05/11/18 0722 05/11/18 0759  BP: (!) 156/106 (!) 143/105  (!) 143/106  Pulse: (!) 108 (!) 106  (!) 110  Resp: Marland Kitchen)  30 (!) 25    Temp: 97.6 F (36.4 C) 97.6 F (36.4 C)    TempSrc: Axillary Oral    SpO2: 100% 99% 99%   Weight:  73.7 kg    Height:  5\' 6"  (1.676 m)      Intake/Output Summary (Last 24 hours) at 05/11/2018 1023 Last data filed at 05/11/2018 0931 Gross per 24 hour    Intake 90.49 ml  Output 1900 ml  Net -1809.51 ml   Filed Weights   05/10/18 2200 05/11/18 0448  Weight: 73.5 kg 73.7 kg    Examination:  General exam: Appears calm and comfortable  Respiratory system: Bibasilar crackles.  No wheezing.  No rhonchi.  Normal respiratory effort.  On nasal cannula.  Cardiovascular system: Tachycardia.  No murmurs rubs or gallops.  Positive JVD.  No lower extremity edema.  Gastrointestinal system: Abdomen is minimal to mildly distended, some diffuse abdominal discomfort, soft, no rebound, no guarding. Central nervous system: Alert and oriented. No focal neurological deficits. Extremities: Symmetric 5 x 5 power. Skin: No rashes, lesions or ulcers Psychiatry: Judgement and insight appear normal. Mood & affect appropriate.     Data Reviewed: I have personally reviewed following labs and imaging studies  CBC: Recent Labs  Lab 05/10/18 2213 05/11/18 0223  WBC 6.7 8.8  NEUTROABS 1.5*  --   HGB 14.4 15.4  HCT 45.5 48.3  MCV 88.0 87.2  PLT 222 161   Basic Metabolic Panel: Recent Labs  Lab 05/07/18 1118 05/10/18 2213 05/11/18 0223  NA 136 138 136  K 3.4* 4.0 4.5  CL 102 106 104  CO2 23 19* 18*  GLUCOSE 113* 159* 143*  BUN 20 18 19   CREATININE 1.77* 1.34* 1.15  CALCIUM 9.0 8.9 9.0  MG  --  2.5*  --    GFR: Estimated Creatinine Clearance: 59.3 mL/min (by C-G formula based on SCr of 1.15 mg/dL). Liver Function Tests: Recent Labs  Lab 05/10/18 2213 05/11/18 0223  AST 39 45*  ALT 20 22  ALKPHOS 66 70  BILITOT 1.0 1.5*  PROT 6.4* 7.0  ALBUMIN 3.3* 3.7   No results for input(s): LIPASE, AMYLASE in the last 168 hours. No results for input(s): AMMONIA in the last 168 hours. Coagulation Profile: No results for input(s): INR, PROTIME in the last 168 hours. Cardiac Enzymes: Recent Labs  Lab 05/11/18 0223 05/11/18 0622  TROPONINI 0.65* 0.56*   BNP (last 3 results) No results for input(s): PROBNP in the last 8760 hours. HbA1C: No  results for input(s): HGBA1C in the last 72 hours. CBG: No results for input(s): GLUCAP in the last 168 hours. Lipid Profile: No results for input(s): CHOL, HDL, LDLCALC, TRIG, CHOLHDL, LDLDIRECT in the last 72 hours. Thyroid Function Tests: No results for input(s): TSH, T4TOTAL, FREET4, T3FREE, THYROIDAB in the last 72 hours. Anemia Panel: No results for input(s): VITAMINB12, FOLATE, FERRITIN, TIBC, IRON, RETICCTPCT in the last 72 hours. Sepsis Labs: No results for input(s): PROCALCITON, LATICACIDVEN in the last 168 hours.  No results found for this or any previous visit (from the past 240 hour(s)).       Radiology Studies: Dg Chest Portable 1 View  Result Date: 05/10/2018 CLINICAL DATA:  Shortness of breath tonight. EXAM: PORTABLE CHEST 1 VIEW COMPARISON:  04/06/2018 FINDINGS: Slightly shallow inspiration. Mild cardiac enlargement. Bilateral perihilar infiltration may indicate edema or multifocal pneumonia. This is progressing since previous study. Possible small left pleural effusion. No pneumothorax. Mediastinal contours appear intact. Postoperative changes in the cervical spine.  Degenerative changes in the shoulders. IMPRESSION: Bilateral perihilar infiltrates, progressing since previous study. Edema versus pneumonia. Possible small left pleural effusion. Electronically Signed   By: Lucienne Capers M.D.   On: 05/10/2018 22:44        Scheduled Meds: . [START ON 05/12/2018] aspirin  81 mg Oral Daily  . carvedilol  12.5 mg Oral BID WC  . furosemide  40 mg Intravenous BID  . [START ON 05/12/2018] sacubitril-valsartan  1 tablet Oral BID  . sodium chloride flush  3 mL Intravenous Q12H   Continuous Infusions:    LOS: 0 days    Time spent: 40 minutes    Irine Seal, MD [facility] Pager [phone]  If 7PM-7AM, please contact night-coverage [email or web address] Password [facility] 05/11/2018, 10:23 AM

## 2018-05-11 NOTE — Progress Notes (Signed)
Pt declined use of BIPAP tonight- Pt wearing 2lpm cannula with sats of 98%%.  Resting comfortably with no distress and no increased work of breathing.

## 2018-05-11 NOTE — ED Notes (Signed)
Dr Randal Buba and RN Anderson Malta informed of pt troponin results .[place]

## 2018-05-11 NOTE — Telephone Encounter (Signed)
Notes on file.

## 2018-05-11 NOTE — Progress Notes (Addendum)
Advanced Heart Failure Rounding Note  PCP-Cardiologist: Quay Burow, MD   Subjective:    Admitted last night with ADHF and CP. On IV lasix. Negative 1.5 L. Weight 162 lbs this am.   Says his dizziness got better with decrease Entresto, but fluid got worse. Woke up with PND. Feels 100% better today from breathing standpoint. Remains on IV NTG and heparin. No bleeding.   CXR with bilateral perihilar infiltrates. Edema vs PNA. Possible small left pleural effusion.   CTA pending. (Negative 04/06/18)  Objective:   Weight Range: 73.7 kg Body mass index is 26.22 kg/m.   Vital Signs:   Temp:  [97.6 F (36.4 C)-97.8 F (36.6 C)] 97.6 F (36.4 C) (08/12 0448) Pulse Rate:  [103-119] 110 (08/12 0759) Resp:  [24-40] 25 (08/12 0448) BP: (143-164)/(95-117) 143/106 (08/12 0759) SpO2:  [84 %-100 %] 99 % (08/12 0722) FiO2 (%):  [30 %] 30 % (08/12 0017) Weight:  [73.5 kg-73.7 kg] 73.7 kg (08/12 0448) Last BM Date: 05/10/18  Weight change: Filed Weights   05/10/18 2200 05/11/18 0448  Weight: 73.5 kg 73.7 kg    Intake/Output:   Intake/Output Summary (Last 24 hours) at 05/11/2018 0836 Last data filed at 05/11/2018 0400 Gross per 24 hour  Intake 90.49 ml  Output 1650 ml  Net -1559.51 ml      Physical Exam    General:  Well appearing. No resp difficulty HEENT: Normal anicteric Neck: Supple. JVP ~8-9 cm. Carotids 2+ bilat; no bruits. No lymphadenopathy or thyromegaly appreciated. Cor: PMI nondisplaced. Tachycardic regular. No rubs, gallops or murmurs. Lungs: Diminished basilar sounds with bilateral crackles approx 1/3 of the way up.  Abdomen: Soft, nontender, + distended. No hepatosplenomegaly. No bruits or masses. Good bowel sounds. Extremities: No cyanosis, clubbing, or rash. Trace edema Neuro: Alert & orientedx3, cranial nerves grossly intact. moves all 4 extremities w/o difficulty. Affect pleasant   Telemetry   Sinus tach 100-110s, personally reviewed.   EKG      Sinus tach 109, personally reviewed.   Labs    CBC Recent Labs    05/10/18 2213 05/11/18 0223  WBC 6.7 8.8  NEUTROABS 1.5*  --   HGB 14.4 15.4  HCT 45.5 48.3  MCV 88.0 87.2  PLT 222 161   Basic Metabolic Panel Recent Labs    05/10/18 2213 05/11/18 0223  NA 138 136  K 4.0 4.5  CL 106 104  CO2 19* 18*  GLUCOSE 159* 143*  BUN 18 19  CREATININE 1.34* 1.15  CALCIUM 8.9 9.0  MG 2.5*  --    Liver Function Tests Recent Labs    05/10/18 2213 05/11/18 0223  AST 39 45*  ALT 20 22  ALKPHOS 66 70  BILITOT 1.0 1.5*  PROT 6.4* 7.0  ALBUMIN 3.3* 3.7   No results for input(s): LIPASE, AMYLASE in the last 72 hours. Cardiac Enzymes Recent Labs    05/11/18 0223 05/11/18 0622  TROPONINI 0.65* 0.56*    BNP: BNP (last 3 results) Recent Labs    12/24/17 1129 02/16/18 1620 05/10/18 2213  BNP 6.9 16.3 2,019.9*    ProBNP (last 3 results) No results for input(s): PROBNP in the last 8760 hours.   D-Dimer No results for input(s): DDIMER in the last 72 hours. Hemoglobin A1C No results for input(s): HGBA1C in the last 72 hours. Fasting Lipid Panel No results for input(s): CHOL, HDL, LDLCALC, TRIG, CHOLHDL, LDLDIRECT in the last 72 hours. Thyroid Function Tests No results for input(s): TSH, T4TOTAL,  T3FREE, THYROIDAB in the last 72 hours.  Invalid input(s): FREET3  Other results:   Imaging    Dg Chest Portable 1 View  Result Date: 05/10/2018 CLINICAL DATA:  Shortness of breath tonight. EXAM: PORTABLE CHEST 1 VIEW COMPARISON:  04/06/2018 FINDINGS: Slightly shallow inspiration. Mild cardiac enlargement. Bilateral perihilar infiltration may indicate edema or multifocal pneumonia. This is progressing since previous study. Possible small left pleural effusion. No pneumothorax. Mediastinal contours appear intact. Postoperative changes in the cervical spine. Degenerative changes in the shoulders. IMPRESSION: Bilateral perihilar infiltrates, progressing since previous  study. Edema versus pneumonia. Possible small left pleural effusion. Electronically Signed   By: Lucienne Capers M.D.   On: 05/10/2018 22:44      Medications:     Scheduled Medications: . aspirin  324 mg Oral Once  . [START ON 05/12/2018] aspirin  81 mg Oral Daily  . carvedilol  12.5 mg Oral BID WC  . [START ON 05/12/2018] sacubitril-valsartan  1 tablet Oral BID  . sodium chloride flush  3 mL Intravenous Q12H     Infusions: . heparin 1,000 Units/hr (05/10/18 2307)  . nitroGLYCERIN       PRN Medications:      Patient Profile   Jonathan Dickson is a 62 y.o. male with PMH of chronic systolic CHF, NICM, Cardiac amyloidosis, DM2, HTN, previous polysubstance abuse - including heroin (quit 2004) and depression.  Presented to [facility] 05/10/18 with acute SOB.   Assessment/Plan   1. Acute on chronic Systolic Heart Failure, NICM. 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 IFE normal. SPEP pending. If negative. Proceed with tafamadis therapy - Volume status remains elevated  - Continue IV lasix 40 mg BID for now. - Will likely need RHC once diuresed.  - Continue Entresto 49/51 mg BID. Cut back at last visit due to hypotension.  - Continue coreg 12.5 mg BID. Chronotropic incompetence noted on CPX.  - Have referred as outpatient to EP to discuss ICD. May be reasonable to wait until we see response to tafamadis before making final decision however.   2. Chest pain with mildly elevated troponin - normal coronaries by cath 2017 - Troponin low with flat trend. ECG ok. Not ACS. Likely due to HF - Stop heparin ant NTG gtt.   3.DMII - Per primary.  - Consider jardiance when BP imrpoved  4. HTN - Meds as above.  - Stop NTG gtt.  - Use hydralazine prn.   Medication concerns reviewed with patient and pharmacy team.  Barriers identified: None at this time.   Length of Stay: 0  Annamaria Helling  05/11/2018, 8:36 AM  Advanced Heart Failure Team Pager [phone] (M-F; 7a - 4p)  Please contact [facility] for night-coverage after hours (4p -7a ) and weekends on [email or web address]  Patient seen and examined with the above-signed Advanced Practice Provider and/or Housestaff. I personally reviewed laboratory data, imaging studies and relevant notes. I independently examined the patient and formulated the important aspects of the plan. I have edited the note to reflect any of my changes or salient points. I have personally discussed the plan with the patient and/or family.  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  He is volume overloaded and tachycardic on exam. Agree with continued diuresis. Can  stop NTG and heparin. Recent CPX was marginal. I worry he is getting worse. Will proceed with RHC tomorrow after diurese to reassess for low output state and need for possible advanced therapies. He is pending tafamadis initiation.   [name], MD  11:53 AM

## 2018-05-11 NOTE — Progress Notes (Signed)
Pt received from ED, AO x4. On bypap, breathing even and unlabored in RA. Hypertensive. On heparin and Nitro gtts. Pt connected to monitor. CCMD notified . Call light within reach. Will continue to monitor.

## 2018-05-11 NOTE — Progress Notes (Signed)
Echocardiogram 2D Echocardiogram with Definity has been performed.  05/11/2018 5:55 PM Maudry Mayhew, MHA, RVT, RDCS, RDMS

## 2018-05-12 ENCOUNTER — Inpatient Hospital Stay (HOSPITAL_COMMUNITY): Payer: Medicaid Other

## 2018-05-12 ENCOUNTER — Encounter (HOSPITAL_COMMUNITY)
Admission: EM | Disposition: A | Discharge status: Home / Self Care | Payer: Self-pay | Source: Home / Self Care | Attending: Internal Medicine

## 2018-05-12 DIAGNOSIS — I2699 Other pulmonary embolism without acute cor pulmonale: Secondary | ICD-10-CM

## 2018-05-12 DIAGNOSIS — I5043 Acute on chronic combined systolic (congestive) and diastolic (congestive) heart failure: Secondary | ICD-10-CM

## 2018-05-12 LAB — CBC
HCT: 42.8 % (ref 39.0–52.0)
Hemoglobin: 14.5 g/dL (ref 13.0–17.0)
MCH: 28.3 pg (ref 26.0–34.0)
MCHC: 33.9 g/dL (ref 30.0–36.0)
MCV: 83.6 fL (ref 78.0–100.0)
PLATELETS: 185 10*3/uL (ref 150–400)
RBC: 5.12 MIL/uL (ref 4.22–5.81)
RDW: 13.1 % (ref 11.5–15.5)
WBC: 11.3 10*3/uL — ABNORMAL HIGH (ref 4.0–10.5)

## 2018-05-12 LAB — BASIC METABOLIC PANEL
Anion gap: 12 (ref 5–15)
BUN: 21 mg/dL (ref 8–23)
CALCIUM: 9.1 mg/dL (ref 8.9–10.3)
CO2: 24 mmol/L (ref 22–32)
Chloride: 102 mmol/L (ref 98–111)
Creatinine, Ser: 1.07 mg/dL (ref 0.61–1.24)
GFR calc Af Amer: >60 mL/min (ref >60)
GLUCOSE: 128 mg/dL — AB (ref 70–99)
POTASSIUM: 3.4 mmol/L — AB (ref 3.5–5.1)
Sodium: 138 mmol/L (ref 135–145)

## 2018-05-12 LAB — MRSA PCR SCREENING: MRSA by PCR: NEGATIVE

## 2018-05-12 LAB — ECHOCARDIOGRAM COMPLETE
Height: 66 in
Weight: 2599.66 oz

## 2018-05-12 LAB — HEPARIN LEVEL (UNFRACTIONATED): Heparin Unfractionated: 0.29 IU/mL — ABNORMAL LOW (ref 0.30–0.70)

## 2018-05-12 SURGERY — RIGHT HEART CATH
Anesthesia: LOCAL

## 2018-05-12 MED ORDER — ACETAMINOPHEN 325 MG PO TABS
650.0000 mg | ORAL_TABLET | Freq: Four times a day (QID) | ORAL | Status: DC | PRN
  Administered 2018-05-12 – 2018-05-13 (×3): 650 mg via ORAL
  Filled 2018-05-12 (×3): qty 2

## 2018-05-12 MED ORDER — TRAMADOL HCL 50 MG PO TABS
50.0000 mg | ORAL_TABLET | Freq: Four times a day (QID) | ORAL | Status: DC | PRN
  Administered 2018-05-13: 100 mg via ORAL
  Filled 2018-05-12: qty 2

## 2018-05-12 MED ORDER — HEPARIN BOLUS VIA INFUSION
4000.0000 [IU] | Freq: Once | INTRAVENOUS | Status: AC
  Administered 2018-05-12: 4000 [IU] via INTRAVENOUS
  Filled 2018-05-12: qty 4000

## 2018-05-12 MED ORDER — POTASSIUM CHLORIDE CRYS ER 20 MEQ PO TBCR
40.0000 meq | EXTENDED_RELEASE_TABLET | Freq: Once | ORAL | Status: AC
  Administered 2018-05-12: 40 meq via ORAL
  Filled 2018-05-12: qty 2

## 2018-05-12 MED ORDER — IOPAMIDOL (ISOVUE-370) INJECTION 76%
INTRAVENOUS | Status: AC
  Filled 2018-05-12: qty 100

## 2018-05-12 MED ORDER — HEPARIN (PORCINE) IN NACL 100-0.45 UNIT/ML-% IJ SOLN
1100.0000 [IU]/h | INTRAMUSCULAR | Status: DC
  Administered 2018-05-12: 1000 [IU]/h via INTRAVENOUS
  Administered 2018-05-13: 1100 [IU]/h via INTRAVENOUS
  Filled 2018-05-12 (×2): qty 250

## 2018-05-12 MED ORDER — IOPAMIDOL (ISOVUE-370) INJECTION 76%
100.0000 mL | Freq: Once | INTRAVENOUS | Status: AC
  Administered 2018-05-12: 100 mL via INTRAVENOUS

## 2018-05-12 MED ORDER — ONDANSETRON HCL 4 MG/2ML IJ SOLN
4.0000 mg | Freq: Four times a day (QID) | INTRAMUSCULAR | Status: DC | PRN
Start: 2018-05-12 — End: 2018-05-14

## 2018-05-12 NOTE — Progress Notes (Signed)
[facility] for Heparin Indication: pulmonary embolus  Allergies  Allergen Reactions  . Peanut-Containing Drug Products Swelling  . Penicillins Other (See Comments)    CONVULSIONS Has patient had a PCN reaction causing immediate rash, facial/tongue/throat swelling, SOB or lightheadedness with hypotension: No Has patient had a PCN reaction causing severe rash involving mucus membranes or skin necrosis: No Has patient had a PCN reaction that required hospitalization No Has patient had a PCN reaction occurring within the last 10 years: No If all of the above answers are "NO", then may proceed with Cephalosporin use.   . Decadron [Dexamethasone] Itching    Patient Measurements: Height: 5\' 6"  (167.6 cm) Weight: 159 lb 1.6 oz (72.2 kg) IBW/kg (Calculated) : 63.8 Heparin Dosing Weight: 74k  Vital Signs: BP: 132/86 (08/13 1710) Pulse Rate: 102 (08/13 1710)  Labs: Recent Labs    05/10/18 2213 05/11/18 0223 05/11/18 0622 05/11/18 1400 05/12/18 0337 05/12/18 1615  HGB 14.4 15.4  --   --  14.5  --   HCT 45.5 48.3  --   --  42.8  --   PLT 222 196  --   --  185  --   HEPARINUNFRC  --   --  0.59  --   --  0.29*  CREATININE 1.34* 1.15  --   --  1.07  --   TROPONINI  --  0.65* 0.56* 0.48*  --   --     Estimated Creatinine Clearance: 63.8 mL/min (by C-G formula based on SCr of 1.07 mg/dL).   Assessment: 72 yoM admitted with SOB and elevated troponins initially started on IV heparin for ACS r/o which was stopped yesterday due to low suspicion. CT chest this morning revealed bilateral PEs so pharmacy consulted to resume IV heparin. CBC stable this morning, heparin off since 1030 8/12 so will rebolus but was stable on 1000 units/hr so will resume this drip rate.  PM heparin level = 0.29  Goal of Therapy:  Heparin level 0.3-0.7 units/ml Monitor platelets by anticoagulation protocol: Yes   Plan:  -Increase heparin to 1100 units / hr -Check 6-hr  heparin level -Monitor heparin level, CBC, S/Sx bleeding daily  Thank you Anette Guarneri, PharmD [phone] Please check AMION for all [place] numbers 05/12/2018

## 2018-05-12 NOTE — Progress Notes (Signed)
ANTICOAGULATION CONSULT NOTE - Initial Consult  Pharmacy Consult for Heparin Indication: pulmonary embolus  Allergies  Allergen Reactions  . Peanut-Containing Drug Products Swelling  . Penicillins Other (See Comments)    CONVULSIONS Has patient had a PCN reaction causing immediate rash, facial/tongue/throat swelling, SOB or lightheadedness with hypotension: No Has patient had a PCN reaction causing severe rash involving mucus membranes or skin necrosis: No Has patient had a PCN reaction that required hospitalization No Has patient had a PCN reaction occurring within the last 10 years: No If all of the above answers are "NO", then may proceed with Cephalosporin use.   . Decadron [Dexamethasone] Itching    Patient Measurements: Height: 5\' 6"  (167.6 cm) Weight: 159 lb 1.6 oz (72.2 kg) IBW/kg (Calculated) : 63.8 Heparin Dosing Weight: 74k  Vital Signs: Temp: 97.9 F (36.6 C) (08/13 0346) Temp Source: Oral (08/13 0346) BP: 132/95 (08/13 0747) Pulse Rate: 87 (08/13 0747)  Labs: Recent Labs    05/10/18 2213 05/11/18 0223 05/11/18 0622 05/11/18 1400 05/12/18 0337  HGB 14.4 15.4  --   --  14.5  HCT 45.5 48.3  --   --  42.8  PLT 222 196  --   --  185  HEPARINUNFRC  --   --  0.59  --   --   CREATININE 1.34* 1.15  --   --  1.07  TROPONINI  --  0.65* 0.56* 0.48*  --     Estimated Creatinine Clearance: 63.8 mL/min (by C-G formula based on SCr of 1.07 mg/dL).   Medical History: Past Medical History:  Diagnosis Date  . Abnormal TSH 02/2016  . Anxiety   . Cervical disc disease   . Chronic combined systolic and diastolic CHF (congestive heart failure) ([place])    a. 01/2016 Echo: EF 25%, inf AK, diffuse sev HK, Gr1 DD, mild MR.  . Depression   . Hyperglycemia   . Hypertension   . Hypertensive heart disease   . Myocardial infarction [facility])    ??  maybe  . NICM (nonischemic cardiomyopathy) ([place])    a. 01/2016 Echo: EF 25%, inf AK, diffuse sev HK, Gr1 DD, mild MR; b. 01/2016 MV:  EF 22%, no isch/infarct;  c. 02/2016 Cath: Nl cors, EF 35-45%.  . Pre-diabetes   . Seizures ([place])    with penicillin     Assessment: 72 yoM admitted with SOB and elevated troponins initially started on IV heparin for ACS r/o which was stopped yesterday due to low suspicion. CT chest this morning revealed bilateral PEs so pharmacy consulted to resume IV heparin. CBC stable this morning, heparin off since 1030 yesterday so will rebolus but was stable on 1000 units/hr so will resume this drip rate.  Goal of Therapy:  Heparin level 0.3-0.7 units/ml Monitor platelets by anticoagulation protocol: Yes   Plan:  -Heparin 4000 units x1 -Heparin 1000 units/hr -Check 6-hr heparin level -Monitor heparin level, CBC, S/Sx bleeding daily   Arrie Senate, PharmD, BCPS Clinical Pharmacist [phone] Please check AMION for all [place] numbers 05/12/2018

## 2018-05-12 NOTE — Progress Notes (Signed)
PROGRESS NOTE    Jonathan Dickson  JAS:[id number] DOB: [birth year] DOA: 05/10/2018 PCP: Ladell Pier, MD    Brief Narrative: Patient is a pleasant 62 year old gentleman history of chronic systolic and diastolic heart failure with a EF of 30 to 35% from 2D echo of April 2018, recent cardiac MRI(04/01/2018) suggestive of cardiac amyloidosis, NYHA class III symptoms at baseline, she of hypertension presented to the ED with a weight gain of at least 20 pounds over the past 6 months, acute shortness of breath with severe tachypnea, elevated BNP of 2019, creatinine of 1.34 and requiring BiPAP and IV Lasix on presentation.  Patient also noted to have elevated troponins.  Patient placed on IV diuretics, heparin, nitroglycerin drip.  Cardiology consulted.   Assessment & Plan:   Principal Problem:   Acute respiratory failure with hypoxia (HCC) Active Problems:   Bilateral pulmonary embolism (HCC)   Acute on chronic combined systolic and diastolic congestive heart failure (HCC)   Congestive dilated cardiomyopathy (HCC)   NICM (nonischemic cardiomyopathy) (HCC)   Hypertensive heart disease   Heart failure (HCC)   Elevated troponin   AKI (acute kidney injury) ([place])   #1 acute respiratory failure with hypoxia likely secondary to acute on chronic combined systolic and diastolic CHF exacerbation large bilateral pulmonary emboli. Patient presented with acute respiratory failure with hypoxia requiring BiPAP, elevated BNP.  Cardiac enzymes drawn were elevated.  Chest x-ray done with bilateral infiltrates likely secondary to CHF exacerbation.  Patient afebrile.  Patient with normal white count.  EKG on admission with sinus tachycardia with a rate of 110, poor R wave progression, nonspecific T wave abnormalities.  Patient with clinical improvement.  CT angiogram chest done 05/12/2018 consistent with large bilateral pulmonary emboli.  Blood pressure stable.  2D echo done results pending.  Check lower extremity  Dopplers.  Patient currently on IV Lasix with a urine output of 1.5 to 5 L over the past 24 hours.  Patient is -3 L during this hospitalization.  Current weight is 159.1 pounds from 162.48 pounds yesterday 05/11/2018.  Continue current dose of Lasix 40 mg IV every 12 hours, Coreg, Entresto.  Patient started on IV heparin.  If patient decompensates hemodynamically will likely need consultation with critical care medicine for consideration for thrombolytics.  Will follow for now.  Cardiology following.  2.  Nonischemic cardiomyopathy Patient with recent cardiac MR done concerning for amyloidosis.  Continue current cardiac regimen of aspirin, Coreg, Entresto, diuretics.  Cardiology following.  3.  Elevated troponin Likely secondary to a demand ischemia from acute on chronic combined systolic and diastolic heart failure and bilateral PE.Marland Kitchen  EKG with a sinus tachycardia with poor R wave progression.  2D echo has been done results pending.  Patient placed back on empiric IV heparin.  IV nitroglycerin discontinued per cardiology.  Cardiology following.    4.  Hypertension Stable.  Continue current regimen of Coreg and Entresto.  Cardiology following.    5.  Acute kidney injury Likely secondary to prerenal azotemia from acute CHF exacerbation.  Renal function improving with diuresis.  Monitor closely while on Entresto and diuretics.  Follow.  6.  Large bilateral PE  CT PE protocol ordered which was done this morning 05/12/2018 and positive for large bilateral PE.  2D echo has been done however has not been read yet and results pending.  Check a lower extremity Dopplers.  Blood pressure currently stable.  If patient gets hypotensive will need consultation with critical care and may need thrombolytics,  however will monitor for now.  Patient has been started on IV heparin.     DVT prophylaxis: Heparin Code Status: Full Family Communication: Updated patient.  No family at bedside. Disposition Plan: Remain in  stepdown unit.    Consultants:   Cardiology: Dr. De Nurse 05/11/2018  Heart failure team Bensimhon 05/11/2018  Procedures:  CT angiogram chest 05/12/2018  Chest x-ray 05/10/2018  2D echo 05/11/2018  Antimicrobials:   None   Subjective: Laying flat in bed.  Denies any chest pain no shortness of breath.  No lower extremity pain.  No nausea or vomiting.  Feeling better since admission.  Refused BiPAP overnight currently on nasal cannula with sats of 95% on 2 L nasal cannula.  Objective: Vitals:   05/11/18 1944 05/12/18 0007 05/12/18 0346 05/12/18 0747  BP: (!) 153/94 (!) 131/96 127/87 (!) 132/95  Pulse: (!) 103 (!) 101 99 87  Resp: (!) 22 20 (!) 22   Temp: 98.5 F (36.9 C) 98.2 F (36.8 C) 97.9 F (36.6 C)   TempSrc: Oral Oral Oral   SpO2: 97% 96% 95%   Weight:   72.2 kg   Height:        Intake/Output Summary (Last 24 hours) at 05/12/2018 1033 Last data filed at 05/12/2018 0752 Gross per 24 hour  Intake 14 ml  Output 1275 ml  Net -1261 ml   Filed Weights   05/10/18 2200 05/11/18 0448 05/12/18 0346  Weight: 73.5 kg 73.7 kg 72.2 kg    Examination:  General exam: Appears calm and comfortable  Respiratory system: Bibasilar crackles.  No wheezing.  No rhonchi.  Normal respiratory effort.  Cardiovascular system: Tachycardia.  No murmurs rubs or gallops.  No JVD.  No lower extremity edema.  Gastrointestinal system: Abdomen is soft, nontender, nondistended, positive bowel sounds.  No rebound.  No guarding.   Central nervous system: Alert and oriented. No focal neurological deficits. Extremities: Symmetric 5 x 5 power. Skin: No rashes, lesions or ulcers Psychiatry: Judgement and insight appear normal. Mood & affect appropriate.     Data Reviewed: I have personally reviewed following labs and imaging studies  CBC: Recent Labs  Lab 05/10/18 2213 05/11/18 0223 05/12/18 0337  WBC 6.7 8.8 11.3*  NEUTROABS 1.5*  --   --   HGB 14.4 15.4 14.5  HCT 45.5 48.3 42.8  MCV  88.0 87.2 83.6  PLT 222 196 144   Basic Metabolic Panel: Recent Labs  Lab 05/07/18 1118 05/10/18 2213 05/11/18 0223 05/12/18 0337  NA 136 138 136 138  K 3.4* 4.0 4.5 3.4*  CL 102 106 104 102  CO2 23 19* 18* 24  GLUCOSE 113* 159* 143* 128*  BUN 20 18 19 21   CREATININE 1.77* 1.34* 1.15 1.07  CALCIUM 9.0 8.9 9.0 9.1  MG  --  2.5*  --   --    GFR: Estimated Creatinine Clearance: 63.8 mL/min (by C-G formula based on SCr of 1.07 mg/dL). Liver Function Tests: Recent Labs  Lab 05/10/18 2213 05/11/18 0223  AST 39 45*  ALT 20 22  ALKPHOS 66 70  BILITOT 1.0 1.5*  PROT 6.4* 7.0  ALBUMIN 3.3* 3.7   No results for input(s): LIPASE, AMYLASE in the last 168 hours. No results for input(s): AMMONIA in the last 168 hours. Coagulation Profile: No results for input(s): INR, PROTIME in the last 168 hours. Cardiac Enzymes: Recent Labs  Lab 05/11/18 0223 05/11/18 0622 05/11/18 1400  TROPONINI 0.65* 0.56* 0.48*   BNP (last 3 results) No  results for input(s): PROBNP in the last 8760 hours. HbA1C: Recent Labs    05/11/18 0622  HGBA1C 6.3*   CBG: No results for input(s): GLUCAP in the last 168 hours. Lipid Profile: No results for input(s): CHOL, HDL, LDLCALC, TRIG, CHOLHDL, LDLDIRECT in the last 72 hours. Thyroid Function Tests: No results for input(s): TSH, T4TOTAL, FREET4, T3FREE, THYROIDAB in the last 72 hours. Anemia Panel: No results for input(s): VITAMINB12, FOLATE, FERRITIN, TIBC, IRON, RETICCTPCT in the last 72 hours. Sepsis Labs: No results for input(s): PROCALCITON, LATICACIDVEN in the last 168 hours.  Recent Results (from the past 240 hour(s))  MRSA PCR Screening     Status: None   Collection Time: 05/11/18 10:38 PM  Result Value Ref Range Status   MRSA by PCR NEGATIVE NEGATIVE Final    Comment:        The GeneXpert MRSA Assay (FDA approved for NASAL specimens only), is one component of a comprehensive MRSA colonization surveillance program. It is  not intended to diagnose MRSA infection nor to guide or monitor treatment for MRSA infections. Performed at [facility], [place] [address] [place], [place] [address]          Radiology Studies: Ct Angio Chest Pe W And/or Wo Contrast  Result Date: 05/12/2018 CLINICAL DATA:  Shortness of breath. EXAM: CT ANGIOGRAPHY CHEST WITH CONTRAST TECHNIQUE: Multidetector CT imaging of the chest was performed using the standard protocol during bolus administration of intravenous contrast. Multiplanar CT image reconstructions and MIPs were obtained to evaluate the vascular anatomy. CONTRAST:  65 mL ISOVUE-370 IOPAMIDOL (ISOVUE-370) INJECTION 76% COMPARISON:  Radiograph of May 10, 2018. CT scan of April 06, 2018. FINDINGS: Cardiovascular: Large filling defects are noted in the lower lobes of both pulmonary arteries, right greater than left. RV/LV ratio of 0.85 is noted which is within normal limits. Cardiomegaly is noted without pericardial effusion. Mediastinum/Nodes: Moderate size hiatal hernia is noted. No adenopathy is noted. Bony thorax is unremarkable. Lungs/Pleura: No pneumothorax or significant pleural effusion is noted. Minimal bilateral posterior basilar subsegmental atelectasis is noted. Upper Abdomen: No acute abnormality. Musculoskeletal: No chest wall abnormality. No acute or significant osseous findings. Review of the MIP images confirms the above findings. IMPRESSION: Large bilateral pulmonary emboli are noted. Critical Value/emergent results were called by telephone at the time of interpretation on 05/12/2018 at 8:47 am to Garner Nash, RN, who verbally acknowledged these results and will contact the ordering physician. Moderate size hiatal hernia is noted. Electronically Signed   By: Marijo Conception, M.D.   On: 05/12/2018 08:48   Dg Chest Portable 1 View  Result Date: 05/10/2018 CLINICAL DATA:  Shortness of breath tonight. EXAM: PORTABLE CHEST 1 VIEW COMPARISON:  04/06/2018 FINDINGS:  Slightly shallow inspiration. Mild cardiac enlargement. Bilateral perihilar infiltration may indicate edema or multifocal pneumonia. This is progressing since previous study. Possible small left pleural effusion. No pneumothorax. Mediastinal contours appear intact. Postoperative changes in the cervical spine. Degenerative changes in the shoulders. IMPRESSION: Bilateral perihilar infiltrates, progressing since previous study. Edema versus pneumonia. Possible small left pleural effusion. Electronically Signed   By: Lucienne Capers M.D.   On: 05/10/2018 22:44        Scheduled Meds: . aspirin  81 mg Oral Daily  . carvedilol  12.5 mg Oral BID WC  . furosemide  40 mg Intravenous BID  . heparin  4,000 Units Intravenous Once  . iopamidol      . potassium chloride  40 mEq Oral Once  .  sacubitril-valsartan  1 tablet Oral BID  . sodium chloride flush  3 mL Intravenous Q12H  . sodium chloride flush  3 mL Intravenous Q12H   Continuous Infusions: . sodium chloride 10 mL/hr at 05/12/18 0655  . heparin       LOS: 1 day    Time spent: 40 minutes    Irine Seal, MD [facility] Pager [phone]  If 7PM-7AM, please contact night-coverage [email or web address] Password [email or web address] 05/12/2018, 10:33 AM

## 2018-05-12 NOTE — Progress Notes (Signed)
Pt c/o headache that has worsened. Pt states his had is throbbing when he closes his eyes. BP 138/95 p 82 R 20. Sat 97% r/a. Paged Dr. Grandville Silos for orders for Tylenol. Jerald Kief, RN

## 2018-05-12 NOTE — Progress Notes (Addendum)
Advanced Heart Failure Rounding Note  PCP-Cardiologist: Quay Burow, MD   Subjective:    Admitted  with ADHF and CP.  Yesterday diuresed with IV lasix. Weight down 3 more pounds.  CTA -this morning with bilateral PE.   Feeling better. Denies SOB.   No further CP.   Objective:   Weight Range: 72.2 kg Body mass index is 25.68 kg/m.   Vital Signs:   Temp:  [97.9 F (36.6 C)-98.5 F (36.9 C)] 97.9 F (36.6 C) (08/13 0346) Pulse Rate:  [87-110] 87 (08/13 0747) Resp:  [20-30] 22 (08/13 0346) BP: (127-160)/(87-98) 132/95 (08/13 0747) SpO2:  [95 %-99 %] 95 % (08/13 0346) Weight:  [72.2 kg] 72.2 kg (08/13 0346) Last BM Date: 05/10/18  Weight change: Filed Weights   05/10/18 2200 05/11/18 0448 05/12/18 0346  Weight: 73.5 kg 73.7 kg 72.2 kg    Intake/Output:   Intake/Output Summary (Last 24 hours) at 05/12/2018 0815 Last data filed at 05/12/2018 0752 Gross per 24 hour  Intake 14 ml  Output 1525 ml  Net -1511 ml      Physical Exam    General:   Lying in bed No resp difficulty HEENT: normal Neck: supple. JVP 7-8 . Carotids 2+ bilat; no bruits. No lymphadenopathy or thryomegaly appreciated. Cor: PMI nondisplaced. Tachy Regular rate & rhythm. No rubs, gallops or murmurs. Lungs: coarse throughout on 2 liters oxygen.  Abdomen: soft, nontender, nondistended. No hepatosplenomegaly. No bruits or masses. Good bowel sounds. Extremities: no cyanosis, clubbing, rash, edema Neuro: alert & oriented x 3, cranial nerves grossly intact. moves all 4 extremities w/o difficulty. Affect pleasant   Telemetry   Sinus Tach 100s occasional PVC personally reviewed.  EKG    Sinus tach 109, personally reviewed.   Labs    CBC Recent Labs    05/10/18 2213 05/11/18 0223 05/12/18 0337  WBC 6.7 8.8 11.3*  NEUTROABS 1.5*  --   --   HGB 14.4 15.4 14.5  HCT 45.5 48.3 42.8  MCV 88.0 87.2 83.6  PLT 222 196 914   Basic Metabolic Panel Recent Labs    05/10/18 2213  05/11/18 0223 05/12/18 0337  NA 138 136 138  K 4.0 4.5 3.4*  CL 106 104 102  CO2 19* 18* 24  GLUCOSE 159* 143* 128*  BUN 18 19 21   CREATININE 1.34* 1.15 1.07  CALCIUM 8.9 9.0 9.1  MG 2.5*  --   --    Liver Function Tests Recent Labs    05/10/18 2213 05/11/18 0223  AST 39 45*  ALT 20 22  ALKPHOS 66 70  BILITOT 1.0 1.5*  PROT 6.4* 7.0  ALBUMIN 3.3* 3.7   No results for input(s): LIPASE, AMYLASE in the last 72 hours. Cardiac Enzymes Recent Labs    05/11/18 0223 05/11/18 0622 05/11/18 1400  TROPONINI 0.65* 0.56* 0.48*    BNP: BNP (last 3 results) Recent Labs    12/24/17 1129 02/16/18 1620 05/10/18 2213  BNP 6.9 16.3 2,019.9*    ProBNP (last 3 results) No results for input(s): PROBNP in the last 8760 hours.   D-Dimer No results for input(s): DDIMER in the last 72 hours. Hemoglobin A1C Recent Labs    05/11/18 0622  HGBA1C 6.3*   Fasting Lipid Panel No results for input(s): CHOL, HDL, LDLCALC, TRIG, CHOLHDL, LDLDIRECT in the last 72 hours. Thyroid Function Tests No results for input(s): TSH, T4TOTAL, T3FREE, THYROIDAB in the last 72 hours.  Invalid input(s): FREET3  Other results:   Imaging  No results found.   Medications:     Scheduled Medications: . iopamidol      . aspirin  81 mg Oral Daily  . carvedilol  12.5 mg Oral BID WC  . furosemide  40 mg Intravenous BID  . iopamidol  100 mL Intravenous Once  . potassium chloride  40 mEq Oral Once  . sacubitril-valsartan  1 tablet Oral BID  . sodium chloride flush  3 mL Intravenous Q12H  . sodium chloride flush  3 mL Intravenous Q12H    Infusions: . sodium chloride 10 mL/hr at 05/12/18 0655    PRN Medications:      Patient Profile   Jonathan Dickson is a 62 y.o. male with PMH of chronic systolic CHF, NICM, Cardiac amyloidosis, DM2, HTN, previous polysubstance abuse - including heroin (quit 2004) and depression.  Presented to [facility] 05/10/18 with acute SOB.   Assessment/Plan   1.  Acute on chronic Systolic Heart Failure, NICM. 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 IFE normal. SPEP pending. If negative. Proceed with tafamadis therapy -Volume status stable.  - Continue Entresto 49/51 mg BID. Cut back at outpatient visit due to hypotension.  - Continue coreg 12.5 mg BID. Chronotropic incompetence noted on CPX.  - Have referred as outpatient to EP to discuss ICD. May be reasonable to wait until we see response to tafamadis before making final decision however.  - RHC on hold with new pulmonary emboli.   2. Chest pain with mildly elevated troponin - normal coronaries by cath 2017 - Troponin low with flat trend. ECG ok. Not ACS. Likely due to HF -  CT scan with bilateral PE. No further chest pain.   - Start AC   3.DMII - Per primary.  - Consider jardiance when BP imrpoved  4. HTN - Meds as above.  - Stop NTG gtt.  - Use hydralazine prn.   5. Bilateral PE CTA-large bilateral PE. Discussed results Start heparin. Pharmacy consulted.  Cancel RHC.    Medication concerns reviewed with patient and pharmacy team. Barriers identified: None at this time.   Length of Stay: 1  Darrick Grinder, NP  05/12/2018, 8:15 AM  Advanced Heart Failure Team Pager [phone] (M-F; 7a - 4p)    Patient seen and examined with Darrick Grinder, NP. We discussed all aspects of the encounter. I agree with the assessment and plan as stated above.   CT scan overnight reviewed extensive acute bilateral PE - reviewed personally.(Had CT on 04/06/18 with no PE)  Echo also reviewed personally LVEF ~30% (continues to drop in setting of TTR amyloid). RV normal without evidence of acute strain. LE u/s without DVT.  Will start heparin. Can likely switch to Eliquis once no further testing needed. Cancel RHC.    Await tafamdis initiation  for TTR amyloid.   [name], MD  6:34 PM

## 2018-05-12 NOTE — Progress Notes (Signed)
*  Preliminary Results* Bilateral lower extremity venous duplex completed. Bilateral lower extremities are negative for deep vein thrombosis. There is no evidence of Baker's cyst bilaterally.  05/12/2018 12:04 PM Jonathan Dickson Dawna Part

## 2018-05-13 DIAGNOSIS — R748 Abnormal levels of other serum enzymes: Secondary | ICD-10-CM

## 2018-05-13 DIAGNOSIS — I420 Dilated cardiomyopathy: Secondary | ICD-10-CM

## 2018-05-13 DIAGNOSIS — N179 Acute kidney failure, unspecified: Secondary | ICD-10-CM

## 2018-05-13 DIAGNOSIS — I110 Hypertensive heart disease with heart failure: Principal | ICD-10-CM

## 2018-05-13 DIAGNOSIS — I428 Other cardiomyopathies: Secondary | ICD-10-CM

## 2018-05-13 LAB — CBC
HEMATOCRIT: 50.5 % (ref 39.0–52.0)
HEMOGLOBIN: 16.6 g/dL (ref 13.0–17.0)
MCH: 28.1 pg (ref 26.0–34.0)
MCHC: 32.9 g/dL (ref 30.0–36.0)
MCV: 85.4 fL (ref 78.0–100.0)
Platelets: 224 10*3/uL (ref 150–400)
RBC: 5.91 MIL/uL — ABNORMAL HIGH (ref 4.22–5.81)
RDW: 13.4 % (ref 11.5–15.5)
WBC: 11.6 10*3/uL — ABNORMAL HIGH (ref 4.0–10.5)

## 2018-05-13 LAB — BASIC METABOLIC PANEL
Anion gap: 14 (ref 5–15)
BUN: 27 mg/dL — AB (ref 8–23)
CALCIUM: 8.8 mg/dL — AB (ref 8.9–10.3)
CHLORIDE: 99 mmol/L (ref 98–111)
CO2: 22 mmol/L (ref 22–32)
CREATININE: 1.68 mg/dL — AB (ref 0.61–1.24)
GFR calc non Af Amer: 42 mL/min — ABNORMAL LOW (ref >60)
GFR, EST AFRICAN AMERICAN: 48 mL/min — AB (ref >60)
GLUCOSE: 120 mg/dL — AB (ref 70–99)
Potassium: 4.7 mmol/L (ref 3.5–5.1)
Sodium: 135 mmol/L (ref 135–145)

## 2018-05-13 LAB — GLUCOSE, CAPILLARY
GLUCOSE-CAPILLARY: 136 mg/dL — AB (ref 70–99)
Glucose-Capillary: 106 mg/dL — ABNORMAL HIGH (ref 70–99)

## 2018-05-13 LAB — MAGNESIUM: Magnesium: 2.3 mg/dL (ref 1.7–2.4)

## 2018-05-13 LAB — HEPARIN LEVEL (UNFRACTIONATED): Heparin Unfractionated: 0.63 IU/mL (ref 0.30–0.70)

## 2018-05-13 MED ORDER — INSULIN ASPART 100 UNIT/ML ~~LOC~~ SOLN: 0.0000 [IU] | Freq: Three times a day (TID) | SUBCUTANEOUS | Status: DC

## 2018-05-13 MED ORDER — CARVEDILOL 6.25 MG PO TABS
6.2500 mg | ORAL_TABLET | Freq: Two times a day (BID) | ORAL | Status: DC
  Administered 2018-05-13 – 2018-05-14 (×2): 6.25 mg via ORAL
  Filled 2018-05-13 (×2): qty 1

## 2018-05-13 MED ORDER — APIXABAN 5 MG PO TABS: 5.0000 mg | ORAL_TABLET | Freq: Two times a day (BID) | ORAL | Status: DC

## 2018-05-13 MED ORDER — SACUBITRIL-VALSARTAN 49-51 MG PO TABS
1.0000 | ORAL_TABLET | Freq: Two times a day (BID) | ORAL | Status: DC
  Administered 2018-05-14: 1 via ORAL
  Filled 2018-05-13: qty 1

## 2018-05-13 MED ORDER — APIXABAN 5 MG PO TABS
10.0000 mg | ORAL_TABLET | Freq: Two times a day (BID) | ORAL | Status: DC
  Administered 2018-05-13 – 2018-05-14 (×3): 10 mg via ORAL
  Filled 2018-05-13 (×3): qty 2

## 2018-05-13 NOTE — Progress Notes (Signed)
ANTICOAGULATION CONSULT NOTE - Follow Up Consult  Pharmacy Consult for heparin Indication: pulmonary embolus   Labs: Recent Labs    05/11/18 0223 05/11/18 0622 05/11/18 1400 05/12/18 0337 05/12/18 1615 05/13/18 0035  HGB 15.4  --   --  14.5  --  16.6  HCT 48.3  --   --  42.8  --  50.5  PLT 196  --   --  185  --  224  HEPARINUNFRC  --  0.59  --   --  0.29* 0.63  CREATININE 1.15  --   --  1.07  --  1.68*  TROPONINI 0.65* 0.56* 0.48*  --   --   --     Assessment/Plan:  62yo male therapeutic on heparin after rate change. Will continue gtt at current rate and confirm stable with additional level.   Wynona Neat, PharmD, BCPS  05/13/2018,1:34 AM

## 2018-05-13 NOTE — Progress Notes (Addendum)
Advanced Heart Failure Rounding Note  PCP-Cardiologist: Quay Burow, MD   Subjective:    Admitted  with ADHF and CP.  CTA -with bilateral PE. Yesterday he was started on heparin drip. Denies bleeding. No CP or SOB currently.   Complaining of headache. Denies SOB, orthopnea or PND. Cr 1.3->1.7. A bit lightheaded.   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 RV normal without evidence of acute strain. LE u/s without DVT.  Objective:   Weight Range: 72.4 kg Body mass index is 25.76 kg/m.   Vital Signs:   Temp:  [97.5 F (36.4 C)-98.1 F (36.7 C)] 97.5 F (36.4 C) (08/14 0824) Pulse Rate:  [99-104] 104 (08/14 0824) Resp:  [18-28] 18 (08/14 0824) BP: (122-132)/(86-88) 122/88 (08/14 0824) SpO2:  [95 %-97 %] 97 % (08/14 0824) Weight:  [72.4 kg] 72.4 kg (08/14 0330) Last BM Date: 05/10/18  Weight change: Filed Weights   05/11/18 0448 05/12/18 0346 05/13/18 0330  Weight: 73.7 kg 72.2 kg 72.4 kg    Intake/Output:   Intake/Output Summary (Last 24 hours) at 05/13/2018 0833 Last data filed at 05/13/2018 0825 Gross per 24 hour  Intake 819.92 ml  Output 2075 ml  Net -1255.08 ml      Physical Exam    General:  In bed. No resp difficulty HEENT: normal anicteric Neck: supple. JVP flat  Carotids 2+ bilat; no bruits. No lymphadenopathy or thryomegaly appreciated. Cor: PMI nondisplaced. Regular rate & rhythm. No rubs, gallops or murmurs. Lungs: clear on 2 liters no wheeze Abdomen: soft, nontender, nondistended. No hepatosplenomegaly. No bruits or masses. Good bowel sounds. Extremities: no cyanosis, clubbing, rash, edema Neuro: alert & oriented x 3, cranial nerves grossly intact. moves all 4 extremities w/o difficulty. Affect pleasant   Telemetry   SR/ST 90-100s  Personally reviewed   EKG    Sinus tach 109, personally reviewed.   Labs    CBC Recent Labs    05/10/18 2213  05/12/18 0337 05/13/18 0035  WBC 6.7   < > 11.3* 11.6*    NEUTROABS 1.5*  --   --   --   HGB 14.4   < > 14.5 16.6  HCT 45.5   < > 42.8 50.5  MCV 88.0   < > 83.6 85.4  PLT 222   < > 185 224   < > = values in this interval not displayed.   Basic Metabolic Panel Recent Labs    05/10/18 2213  05/12/18 0337 05/13/18 0035  NA 138   < > 138 135  K 4.0   < > 3.4* 4.7  CL 106   < > 102 99  CO2 19*   < > 24 22  GLUCOSE 159*   < > 128* 120*  BUN 18   < > 21 27*  CREATININE 1.34*   < > 1.07 1.68*  CALCIUM 8.9   < > 9.1 8.8*  MG 2.5*  --   --  2.3   < > = values in this interval not displayed.   Liver Function Tests Recent Labs    05/10/18 2213 05/11/18 0223  AST 39 45*  ALT 20 22  ALKPHOS 66 70  BILITOT 1.0 1.5*  PROT 6.4* 7.0  ALBUMIN 3.3* 3.7   No results for input(s): LIPASE, AMYLASE in the last 72 hours. Cardiac Enzymes Recent Labs    05/11/18 0223 05/11/18 0622 05/11/18 1400  TROPONINI 0.65* 0.56* 0.48*    BNP: BNP (last  3 results) Recent Labs    12/24/17 1129 02/16/18 1620 05/10/18 2213  BNP 6.9 16.3 2,019.9*    ProBNP (last 3 results) No results for input(s): PROBNP in the last 8760 hours.   D-Dimer No results for input(s): DDIMER in the last 72 hours. Hemoglobin A1C Recent Labs    05/11/18 0622  HGBA1C 6.3*   Fasting Lipid Panel No results for input(s): CHOL, HDL, LDLCALC, TRIG, CHOLHDL, LDLDIRECT in the last 72 hours. Thyroid Function Tests No results for input(s): TSH, T4TOTAL, T3FREE, THYROIDAB in the last 72 hours.  Invalid input(s): FREET3  Other results:   Imaging    Ct Angio Chest Pe W And/or Wo Contrast  Result Date: 05/12/2018 CLINICAL DATA:  Shortness of breath. EXAM: CT ANGIOGRAPHY CHEST WITH CONTRAST TECHNIQUE: Multidetector CT imaging of the chest was performed using the standard protocol during bolus administration of intravenous contrast. Multiplanar CT image reconstructions and MIPs were obtained to evaluate the vascular anatomy. CONTRAST:  65 mL ISOVUE-370 IOPAMIDOL  (ISOVUE-370) INJECTION 76% COMPARISON:  Radiograph of May 10, 2018. CT scan of April 06, 2018. FINDINGS: Cardiovascular: Large filling defects are noted in the lower lobes of both pulmonary arteries, right greater than left. RV/LV ratio of 0.85 is noted which is within normal limits. Cardiomegaly is noted without pericardial effusion. Mediastinum/Nodes: Moderate size hiatal hernia is noted. No adenopathy is noted. Bony thorax is unremarkable. Lungs/Pleura: No pneumothorax or significant pleural effusion is noted. Minimal bilateral posterior basilar subsegmental atelectasis is noted. Upper Abdomen: No acute abnormality. Musculoskeletal: No chest wall abnormality. No acute or significant osseous findings. Review of the MIP images confirms the above findings. IMPRESSION: Large bilateral pulmonary emboli are noted. Critical Value/emergent results were called by telephone at the time of interpretation on 05/12/2018 at 8:47 am to Garner Nash, RN, who verbally acknowledged these results and will contact the ordering physician. Moderate size hiatal hernia is noted. Electronically Signed   By: Marijo Conception, M.D.   On: 05/12/2018 08:48     Medications:     Scheduled Medications: . aspirin  81 mg Oral Daily  . carvedilol  12.5 mg Oral BID WC  . furosemide  40 mg Intravenous BID  . sacubitril-valsartan  1 tablet Oral BID  . sodium chloride flush  3 mL Intravenous Q12H  . sodium chloride flush  3 mL Intravenous Q12H    Infusions: . sodium chloride 10 mL/hr at 05/12/18 0655  . heparin 1,100 Units/hr (05/13/18 0406)    PRN Medications:      Patient Profile   Jonathan Dickson is a 62 y.o. male with PMH of chronic systolic CHF, NICM, Cardiac amyloidosis, DM2, HTN, previous polysubstance abuse - including heroin (quit 2004) and depression.  Presented to [facility] 05/10/18 with acute SOB.   Assessment/Plan   1. Acute on chronic Systolic Heart Failure, NICM. 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 IFE normal. SPEP pending. If negative. Proceed with tafamadis therapy -Appears euvolemic. Stop IV lasix. Creatinine rising. Check BMET in am.  - Continue Entresto 49/51 mg BID. Cut back at outpatient visit due to hypotension.  - Cut coreg top 6.25 mg BID. Chronotropic incompetence noted on CPX.  - Have referred as outpatient to EP to discuss ICD. May be reasonable to wait until we see response to tafamadis before  making final decision however.  - RHC on hold with new pulmonary emboli.   2. Chest pain with mildly elevated troponin - normal coronaries by cath 2017 - Troponin low with flat trend. ECG ok. Not ACS. Likely due to HF -  CT scan with bilateral PE. No further chest pain.   - Start AC   3.DMII - Per primary.  - Consider jardiance when BP imrpoved  4. HTN - Stable.   5. Bilateral PE CTA-large bilateral PE. Discussed results On heparin. Plans to start eliquis.  Pharmacy consulted.  6. AkI In the setting diuresis. Creatinine up from 1.1>1.7  Stop IV lasix. Repeat BMET in am.     Medication concerns reviewed with patient and pharmacy team. Barriers identified: None at this time.   Length of Stay: 2  Darrick Grinder, NP  05/13/2018, 8:33 AM  Advanced Heart Failure Team Pager [phone] (M-F; 7a - 4p)   Patient seen and examined with Darrick Grinder, NP. We discussed all aspects of the encounter. I agree with the assessment and plan as stated above.   He remains on heparin for bilateral PE. No bleeding. D/w PharmD -> will switch to apixaban (10 bid x 7 days then 5 mg bid).  Has been overdiuresed. Creatinine worse. Will hold diuretics and encourage po intake today.   Echo reviewed personally and strongly suggestive of amyloid. Tafamadis has been ordered. Awaiting approval. Wil likely start  as outpatient. Will drop carvedilol slightly.   [name], MD  11:26 AM

## 2018-05-13 NOTE — Progress Notes (Signed)
PROGRESS NOTE        PATIENT DETAILS Name: Jonathan Dickson Age: 62 y.o. Sex: male Date of Birth: 1956/02/05 Admit Date: 05/10/2018 Admitting Physician Vilma Prader, MD [facility]:[id number], Dalbert Batman, MD  Brief Narrative: Patient is a 63 y.o. 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  See below for further details  Subjective: But has markedly improved.  Assessment/Plan: Acute hypoxic respiratory failure secondary to decompensated heart failure and bilateral pulmonary embolism:  Improving with diuretics, anticoagulation.  Titrate off oxygen.  Large bilateral pulmonary embolism: No evidence of RV strain on echocardiogram-lower extremity Doppler negative for DVT.  Previously on IV heparin, transition to Eliquis today.  Decompensated systolic heart failure (EF 25-30% on 05/11/2018): Has improved-heart failure team following and managing care.  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  CHF team planning on outpatient treatment with Tafamadis  Acute kidney injury: Hemodynamically mediated-diuretics held today-recheck electro lites tomorrow  DM-2: Metformin on hold.  Follow.  DVT Prophylaxis: Full dose anticoagulation with Eliquis  Code Status: Full code   Family Communication: None/Spouse at bedside  Disposition Plan: Remain inpatient-home in the next day or so.  Antimicrobial agents: Anti-infectives (From admission, onward)   None     Procedures: None  CONSULTS:  cardiology  Time spent: 25 minutes-Greater than 50% of this time was spent in counseling, explanation of diagnosis, planning of further management, and coordination of care.  MEDICATIONS: Scheduled Meds: . apixaban  10 mg Oral BID   Followed by  . [START ON 05/20/2018] apixaban  5 mg  Oral BID  . aspirin  81 mg Oral Daily  . carvedilol  6.25 mg Oral BID WC  . [START ON 05/14/2018] sacubitril-valsartan  1 tablet Oral BID  . sodium chloride flush  3 mL Intravenous Q12H  . sodium chloride flush  3 mL Intravenous Q12H   Continuous Infusions: . sodium chloride 10 mL/hr at 05/12/18 0655   PRN Meds:.acetaminophen, hydrALAZINE, ondansetron (ZOFRAN) IV, traMADol   PHYSICAL EXAM: Vital signs: Vitals:   05/12/18 2342 05/13/18 0330 05/13/18 0824 05/13/18 1302  BP:   122/88 123/81  Pulse:  99 (!) 104 87  Resp:  (!) 28 18 20   Temp: 98.1 F (36.7 C) 97.6 F (36.4 C) (!) 97.5 F (36.4 C) (!) 97.5 F (36.4 C)  TempSrc: Oral Oral Oral Oral  SpO2:  95% 97% 97%  Weight:  72.4 kg    Height:       Filed Weights   05/11/18 0448 05/12/18 0346 05/13/18 0330  Weight: 73.7 kg 72.2 kg 72.4 kg   Body mass index is 25.76 kg/m.   General appearance :Awake, alert, not in any distress.  Eyes:Pink conjunctiva HEENT: Atraumatic and Normocephalic Neck: supple Resp:Good air entry bilaterally, no added sounds  CVS: S1 S2 regular GI: Bowel sounds present, Non tender and not distended with no gaurding, rigidity or rebound.No organomegaly Extremities: B/L Lower Ext shows trace edema, both legs are warm to touch Neurology:  speech clear,Non focal, sensation is grossly intact. Psychiatric: Normal judgment and insight. Alert and oriented x 3. Normal mood. Musculoskeletal:No digital cyanosis Skin:No Rash, warm and dry Wounds:N/A  I have personally reviewed following labs and imaging studies  LABORATORY DATA: CBC: Recent Labs  Lab 05/10/18 2213 05/11/18 0223 05/12/18 0337 05/13/18 0035  WBC 6.7 8.8 11.3* 11.6*  NEUTROABS 1.5*  --   --   --   HGB 14.4 15.4 14.5 16.6  HCT 45.5 48.3 42.8 50.5  MCV 88.0 87.2 83.6 85.4  PLT 222 196 185 656    Basic Metabolic Panel: Recent Labs  Lab 05/07/18 1118 05/10/18 2213 05/11/18 0223 05/12/18 0337 05/13/18 0035  NA 136 138 136 138  135  K 3.4* 4.0 4.5 3.4* 4.7  CL 102 106 104 102 99  CO2 23 19* 18* 24 22  GLUCOSE 113* 159* 143* 128* 120*  BUN 20 18 19 21  27*  CREATININE 1.77* 1.34* 1.15 1.07 1.68*  CALCIUM 9.0 8.9 9.0 9.1 8.8*  MG  --  2.5*  --   --  2.3    GFR: Estimated Creatinine Clearance: 40.6 mL/min (A) (by C-G formula based on SCr of 1.68 mg/dL (H)).  Liver Function Tests: Recent Labs  Lab 05/10/18 2213 05/11/18 0223  AST 39 45*  ALT 20 22  ALKPHOS 66 70  BILITOT 1.0 1.5*  PROT 6.4* 7.0  ALBUMIN 3.3* 3.7   No results for input(s): LIPASE, AMYLASE in the last 168 hours. No results for input(s): AMMONIA in the last 168 hours.  Coagulation Profile: No results for input(s): INR, PROTIME in the last 168 hours.  Cardiac Enzymes: Recent Labs  Lab 05/11/18 0223 05/11/18 0622 05/11/18 1400  TROPONINI 0.65* 0.56* 0.48*    BNP (last 3 results) No results for input(s): PROBNP in the last 8760 hours.  HbA1C: Recent Labs    05/11/18 0622  HGBA1C 6.3*    CBG: No results for input(s): GLUCAP in the last 168 hours.  Lipid Profile: No results for input(s): CHOL, HDL, LDLCALC, TRIG, CHOLHDL, LDLDIRECT in the last 72 hours.  Thyroid Function Tests: No results for input(s): TSH, T4TOTAL, FREET4, T3FREE, THYROIDAB in the last 72 hours.  Anemia Panel: No results for input(s): VITAMINB12, FOLATE, FERRITIN, TIBC, IRON, RETICCTPCT in the last 72 hours.  Urine analysis:    Component Value Date/Time   COLORURINE YELLOW 07/04/2016 0901   APPEARANCEUR CLEAR 07/04/2016 0901   LABSPEC 1.014 07/04/2016 0901   PHURINE 5.0 07/04/2016 0901   GLUCOSEU NEGATIVE 07/04/2016 0901   HGBUR NEGATIVE 07/04/2016 0901   BILIRUBINUR NEGATIVE 07/04/2016 0901   KETONESUR NEGATIVE 07/04/2016 0901   PROTEINUR NEGATIVE 07/04/2016 0901   NITRITE NEGATIVE 07/04/2016 0901   LEUKOCYTESUR NEGATIVE 07/04/2016 0901    Sepsis Labs: Lactic Acid, Venous No results found for: LATICACIDVEN  MICROBIOLOGY: Recent  Results (from the past 240 hour(s))  MRSA PCR Screening     Status: None   Collection Time: 05/11/18 10:38 PM  Result Value Ref Range Status   MRSA by PCR NEGATIVE NEGATIVE Final    Comment:        The GeneXpert MRSA Assay (FDA approved for NASAL specimens only), is one component of a comprehensive MRSA colonization surveillance program. It is not intended to diagnose MRSA infection nor to guide or monitor treatment for MRSA infections. Performed at [facility], [place] [address] [place], [place] [address]     RADIOLOGY STUDIES/RESULTS: Ct Angio Chest Pe W And/or Wo Contrast  Result Date: 05/12/2018 CLINICAL DATA:  Shortness of breath. EXAM: CT ANGIOGRAPHY CHEST WITH CONTRAST TECHNIQUE: Multidetector CT imaging of the chest was performed using the standard protocol during bolus administration of intravenous contrast. Multiplanar CT image reconstructions and MIPs were obtained to evaluate the vascular  anatomy. CONTRAST:  65 mL ISOVUE-370 IOPAMIDOL (ISOVUE-370) INJECTION 76% COMPARISON:  Radiograph of May 10, 2018. CT scan of April 06, 2018. FINDINGS: Cardiovascular: Large filling defects are noted in the lower lobes of both pulmonary arteries, right greater than left. RV/LV ratio of 0.85 is noted which is within normal limits. Cardiomegaly is noted without pericardial effusion. Mediastinum/Nodes: Moderate size hiatal hernia is noted. No adenopathy is noted. Bony thorax is unremarkable. Lungs/Pleura: No pneumothorax or significant pleural effusion is noted. Minimal bilateral posterior basilar subsegmental atelectasis is noted. Upper Abdomen: No acute abnormality. Musculoskeletal: No chest wall abnormality. No acute or significant osseous findings. Review of the MIP images confirms the above findings. IMPRESSION: Large bilateral pulmonary emboli are noted. Critical Value/emergent results were called by telephone at the time of interpretation on 05/12/2018 at 8:47 am to Garner Nash, RN, who  verbally acknowledged these results and will contact the ordering physician. Moderate size hiatal hernia is noted. Electronically Signed   By: Marijo Conception, M.D.   On: 05/12/2018 08:48   Dg Chest Portable 1 View  Result Date: 05/10/2018 CLINICAL DATA:  Shortness of breath tonight. EXAM: PORTABLE CHEST 1 VIEW COMPARISON:  04/06/2018 FINDINGS: Slightly shallow inspiration. Mild cardiac enlargement. Bilateral perihilar infiltration may indicate edema or multifocal pneumonia. This is progressing since previous study. Possible small left pleural effusion. No pneumothorax. Mediastinal contours appear intact. Postoperative changes in the cervical spine. Degenerative changes in the shoulders. IMPRESSION: Bilateral perihilar infiltrates, progressing since previous study. Edema versus pneumonia. Possible small left pleural effusion. Electronically Signed   By: Lucienne Capers M.D.   On: 05/10/2018 22:44     LOS: 2 days   Oren Binet, MD  [facility]  If 7PM-7AM, please contact night-coverage  Please page via [email or web address]-click on MD name and type text message  05/13/2018, 1:20 PM

## 2018-05-13 NOTE — Care Management Note (Signed)
Case Management Note Marvetta Gibbons RN, BSN Unit 4E- RN Care Coordinator  [phone]  Patient Details  Name: Jonathan Dickson MRN: [id number] Date of Birth: 1955/11/08  Subjective/Objective:   Pt admitted with bil PE,, acute resp. distress                 Action/Plan: PTA pt lived at home, independent. - PCP- Wynetta Emery at [facility], referral received for Eliquis needs- pt has Medicaid coverage- and drug is on preferred list - copay $3.80- spoke with pt at bedside- confirmed Medicaid- informed of coverage for Eliquis- pt states he uses [employer] on [address]- pt provided 30 day free card to use on discharge. Pt reports that he has upcoming appointment on 05/27/18 at [facility]   Expected Discharge Date:                  Expected Discharge Plan:  Home/Self Care  In-House Referral:     Discharge planning Services  CM Consult, Medication Assistance  Post Acute Care Choice:    Choice offered to:     DME Arranged:    DME Agency:     HH Arranged:    [place] Agency:     Status of Service:  In process, will continue to follow  If discussed at Long Length of Stay Meetings, dates discussed:    Discharge Disposition: home/self care   Additional Comments:  Dawayne Patricia, RN 05/13/2018, 2:39 PM

## 2018-05-14 LAB — CBC
HCT: 45.2 % (ref 39.0–52.0)
Hemoglobin: 15 g/dL (ref 13.0–17.0)
MCH: 28.1 pg (ref 26.0–34.0)
MCHC: 33.2 g/dL (ref 30.0–36.0)
MCV: 84.8 fL (ref 78.0–100.0)
PLATELETS: 205 10*3/uL (ref 150–400)
RBC: 5.33 MIL/uL (ref 4.22–5.81)
RDW: 13.2 % (ref 11.5–15.5)
WBC: 7.9 10*3/uL (ref 4.0–10.5)

## 2018-05-14 LAB — BASIC METABOLIC PANEL
Anion gap: 9 (ref 5–15)
BUN: 16 mg/dL (ref 8–23)
CALCIUM: 8.9 mg/dL (ref 8.9–10.3)
CO2: 23 mmol/L (ref 22–32)
Chloride: 103 mmol/L (ref 98–111)
Creatinine, Ser: 0.94 mg/dL (ref 0.61–1.24)
GFR calc Af Amer: >60 mL/min (ref >60)
GLUCOSE: 111 mg/dL — AB (ref 70–99)
POTASSIUM: 3.7 mmol/L (ref 3.5–5.1)
Sodium: 135 mmol/L (ref 135–145)

## 2018-05-14 LAB — GLUCOSE, CAPILLARY
Glucose-Capillary: 118 mg/dL — ABNORMAL HIGH (ref 70–99)
Glucose-Capillary: 106 mg/dL — ABNORMAL HIGH (ref 70–99)

## 2018-05-14 MED ORDER — FUROSEMIDE 40 MG PO TABS: 40.0000 mg | ORAL_TABLET | Freq: Every day | ORAL | 0 refills | Status: DC | PRN

## 2018-05-14 MED ORDER — APIXABAN 5 MG PO TABS: 10.0000 mg | ORAL_TABLET | Freq: Two times a day (BID) | ORAL | 0 refills | Status: DC

## 2018-05-14 MED ORDER — APIXABAN 5 MG PO TABS: 5.0000 mg | ORAL_TABLET | Freq: Two times a day (BID) | ORAL | 0 refills | Status: DC

## 2018-05-14 MED ORDER — CARVEDILOL 6.25 MG PO TABS: 12.5000 mg | ORAL_TABLET | Freq: Two times a day (BID) | ORAL | 0 refills | Status: DC

## 2018-05-14 MED ORDER — CARVEDILOL 6.25 MG PO TABS: 6.2500 mg | ORAL_TABLET | Freq: Two times a day (BID) | ORAL | 0 refills | Status: DC

## 2018-05-14 NOTE — Discharge Instructions (Signed)
Information on my medicine - ELIQUIS (apixaban)  This medication education was reviewed with me or my healthcare representative as part of my discharge preparation.  The pharmacist that spoke with me during my hospital stay was:  Einar Grad, [facility]  Why was Eliquis prescribed for you? Eliquis was prescribed to treat blood clots that may have been found in the veins of your legs (deep vein thrombosis) or in your lungs (pulmonary embolism) and to reduce the risk of them occurring again.  What do You need to know about Eliquis ? The starting dose is 10 mg (two 5 mg tablets) taken TWICE daily for the FIRST SEVEN (7) DAYS, then on (enter date)  05/20/18  the dose is reduced to ONE 5 mg tablet taken TWICE daily.  Eliquis may be taken with or without food.   Try to take the dose about the same time in the morning and in the evening. If you have difficulty swallowing the tablet whole please discuss with your pharmacist how to take the medication safely.  Take Eliquis exactly as prescribed and DO NOT stop taking Eliquis without talking to the doctor who prescribed the medication.  Stopping may increase your risk of developing a new blood clot.  Refill your prescription before you run out.  After discharge, you should have regular check-up appointments with your healthcare provider that is prescribing your Eliquis.    What do you do if you miss a dose? If a dose of ELIQUIS is not taken at the scheduled time, take it as soon as possible on the same day and twice-daily administration should be resumed. The dose should not be doubled to make up for a missed dose.  Important Safety Information A possible side effect of Eliquis is bleeding. You should call your healthcare provider right away if you experience any of the following: ? Bleeding from an injury or your nose that does not stop. ? Unusual colored urine (red or dark brown) or unusual colored stools (red or black). ? Unusual bruising  for unknown reasons. ? A serious fall or if you hit your head (even if there is no bleeding).  Some medicines may interact with Eliquis and might increase your risk of bleeding or clotting while on Eliquis. To help avoid this, consult your healthcare provider or pharmacist prior to using any new prescription or non-prescription medications, including herbals, vitamins, non-steroidal anti-inflammatory drugs (NSAIDs) and supplements.  This website has more information on Eliquis (apixaban): http://www.eliquis.com/eliquis/home

## 2018-05-14 NOTE — Discharge Summary (Signed)
PATIENT DETAILS Name: Jonathan Dickson Age: 62 y.o. Sex: male Date of Birth: 02-17-1956 MRN: [id number]. Admitting Physician: Vilma Prader, MD [id number], Dalbert Batman, MD  Admit Date: 05/10/2018 Discharge date: 05/14/2018  Recommendations for Outpatient Follow-up:  1. Follow up with PCP in 1-2 weeks 2. Please obtain BMP/CBC in one week 3. Please ensure follow-up with the CHF clinic. 4. Ensure follow-up by hematology before discontinuing all anticoagulation  Admitted From:  Home  Disposition: [place]: No  Equipment/Devices: None  Discharge Condition: Stable  CODE STATUS: FULL CODE  Diet recommendation:  Heart Healthy / Carb Modified  Brief Summary: See H&P, Labs, Consult and Test reports for all details in brief, Patient is a 62 y.o. 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  See below for further details  Brief Hospital Course: Acute hypoxic respiratory failure secondary to decompensated heart failure and bilateral pulmonary embolism:  Much improved with diuretics, anticoagulation.  Titrated off oxygen on room air this morning  Large bilateral pulmonary embolism: No evidence of RV strain on echocardiogram-lower extremity Doppler negative for DVT.  Previously on IV heparin, transitioned to Eliquis.  Consider follow-up by hematology before discontinuation of anticoagulation in the outpatient setting.  Decompensated systolic heart failure (EF 25-30% on 05/11/2018):  Compensated-appreciate CHF team input.  Will be discharged on as needed Lasix, will be continued on Coreg and Entresto.    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  CHF team planning on outpatient treatment with Tafamadis  Acute kidney injury: Hemodynamically mediated-diuretics briefly  held-electrolytes have normalized.  Plans are to only continue Lasix if 3 pound weight gain.  Repeat electrolytes in 1 week.   DM-2:  CBGs stable with SSI, resume metformin on discharge.    Procedures/Studies: None  Discharge Diagnoses:  Principal Problem:   Acute respiratory failure with hypoxia (HCC) Active Problems:   Congestive dilated cardiomyopathy (HCC)   Acute on chronic combined systolic and diastolic congestive heart failure (HCC)   NICM (nonischemic cardiomyopathy) (HCC)   Hypertensive heart disease   Heart failure (HCC)   Elevated troponin   AKI (acute kidney injury) ([place])   Bilateral pulmonary embolism ([place])   Discharge Instructions:  Activity:  As tolerated    Discharge Instructions    (HEART FAILURE PATIENTS) Call MD:  Anytime you have any of the following symptoms: 1) 3 pound weight gain in 24 hours or 5 pounds in 1 week 2) shortness of breath, with or without a dry hacking cough 3) swelling in the hands, feet or stomach 4) if you have to sleep on extra pillows at night in order to breathe.   Complete by:  As directed    Diet - low sodium heart healthy   Complete by:  As directed    Discharge instructions   Complete by:  As directed    Follow with Primary MD  Ladell Pier, MD in 1 week  Follow with CHF clinic as instructed  Please get a complete blood count and chemistry panel checked by your Primary MD at your next visit, and again as instructed by your Primary MD.  Get Medicines reviewed and adjusted: Please take all your medications with you for your next visit with your Primary MD  Laboratory/radiological data: Please request your Primary MD to go over all hospital tests and procedure/radiological results at the follow up,  please ask your Primary MD to get all Hospital records sent to his/her office.  In some cases, they will be blood work, cultures and biopsy results pending at the time of your discharge. Please request that your primary care  M.D. follows up on these results.  Also Note the following: If you experience worsening of your admission symptoms, develop shortness of breath, life threatening emergency, suicidal or homicidal thoughts you must seek medical attention immediately by calling 911 or calling your MD immediately  if symptoms less severe.  You must read complete instructions/literature along with all the possible adverse reactions/side effects for all the Medicines you take and that have been prescribed to you. Take any new Medicines after you have completely understood and accpet all the possible adverse reactions/side effects.   Do not drive when taking Pain medications or sleeping medications (Benzodaizepines)  Do not take more than prescribed Pain, Sleep and Anxiety Medications. It is not advisable to combine anxiety,sleep and pain medications without talking with your primary care practitioner  Special Instructions: If you have smoked or chewed Tobacco  in the last 2 yrs please stop smoking, stop any regular Alcohol  and or any Recreational drug use.  Wear Seat belts while driving.  Please note: You were cared for by a hospitalist during your hospital stay. Once you are discharged, your primary care physician will handle any further medical issues. Please note that NO REFILLS for any discharge medications will be authorized once you are discharged, as it is imperative that you return to your primary care physician (or establish a relationship with a primary care physician if you do not have one) for your post hospital discharge needs so that they can reassess your need for medications and monitor your lab values.   Heart Failure patients record your daily weight using the same scale at the same time of day   Complete by:  As directed    Increase activity slowly   Complete by:  As directed    STOP any activity that causes chest pain, shortness of breath, dizziness, sweating, or exessive weakness   Complete by:   As directed      Allergies as of 05/14/2018      Reactions   Peanut-containing Drug Products Swelling   Penicillins Other (See Comments)   CONVULSIONS Has patient had a PCN reaction causing immediate rash, facial/tongue/throat swelling, SOB or lightheadedness with hypotension: No Has patient had a PCN reaction causing severe rash involving mucus membranes or skin necrosis: No Has patient had a PCN reaction that required hospitalization No Has patient had a PCN reaction occurring within the last 10 years: No If all of the above answers are "NO", then may proceed with Cephalosporin use.   Decadron [dexamethasone] Itching      Medication List    STOP taking these medications   ibuprofen 600 MG tablet Commonly known as:  ADVIL,MOTRIN     TAKE these medications   amitriptyline 25 MG tablet Commonly known as:  ELAVIL Take 1 tablet (25 mg total) by mouth at bedtime as needed for sleep.   apixaban 5 MG Tabs tablet Commonly known as:  ELIQUIS Take 2 tablets (10 mg total) by mouth 2 (two) times daily. Please take 10 tablets twice daily until 8/20, from 8/21 start taking 5 mg twice daily.   apixaban 5 MG Tabs tablet Commonly known as:  ELIQUIS Take 1 tablet (5 mg total) by mouth 2 (two) times daily. Start taking on:  05/20/2018  carvedilol 6.25 MG tablet Commonly known as:  COREG Take 1 tablet (6.25 mg total) by mouth 2 (two) times daily with a meal. What changed:    medication strength  how much to take  when to take this   cholecalciferol 1000 units tablet Commonly known as:  VITAMIN D Take 1,000 Units by mouth daily.   furosemide 40 MG tablet Commonly known as:  LASIX Take 1 tablet (40 mg total) by mouth daily as needed for fluid (if weight gain more than 3 punds).   loratadine 10 MG tablet Commonly known as:  CLARITIN Take 1 tablet (10 mg total) by mouth daily.   metFORMIN 500 MG 24 hr tablet Commonly known as:  GLUCOPHAGE-XR Take 1 tablet (500 mg total) by  mouth daily with breakfast.   sacubitril-valsartan 49-51 MG Commonly known as:  ENTRESTO Take 1 tablet by mouth 2 (two) times daily.   sildenafil 25 MG tablet Commonly known as:  VIAGRA Take 1 tablet (25 mg total) by mouth daily as needed for erectile dysfunction.      Follow-up Information    [facility] Follow up on 05/21/2018.   Specialty:  Cardiology Why:  Heart Failure Followup at [facility]/Dropoff at ER lot (enter under blue "Specialty Clinics" awning) or under [facility] on [address] ([address] entrance, garage code: 1500, elevator 1st floor). Take all am meds, bring all med bottles. Contact information: [address] [id number] [place]       Ladell Pier, MD. Schedule an appointment as soon as possible for a visit in 1 week(s).   Specialty:  Internal Medicine Contact information: [place] [address] [phone]        Lorretta Harp, MD .   Specialties:  Cardiology, Radiology Contact information: [address] [place] [address] [place]  [address] [phone]          Allergies  Allergen Reactions  . Peanut-Containing Drug Products Swelling  . Penicillins Other (See Comments)    CONVULSIONS Has patient had a PCN reaction causing immediate rash, facial/tongue/throat swelling, SOB or lightheadedness with hypotension: No Has patient had a PCN reaction causing severe rash involving mucus membranes or skin necrosis: No Has patient had a PCN reaction that required hospitalization No Has patient had a PCN reaction occurring within the last 10 years: No If all of the above answers are "NO", then may proceed with Cephalosporin use.   . Decadron [Dexamethasone] Itching    Consultations:   cardiology  Other Procedures/Studies: Ct Angio Chest Pe W And/or Wo Contrast  Result Date: 05/12/2018 CLINICAL DATA:  Shortness of  breath. EXAM: CT ANGIOGRAPHY CHEST WITH CONTRAST TECHNIQUE: Multidetector CT imaging of the chest was performed using the standard protocol during bolus administration of intravenous contrast. Multiplanar CT image reconstructions and MIPs were obtained to evaluate the vascular anatomy. CONTRAST:  65 mL ISOVUE-370 IOPAMIDOL (ISOVUE-370) INJECTION 76% COMPARISON:  Radiograph of May 10, 2018. CT scan of April 06, 2018. FINDINGS: Cardiovascular: Large filling defects are noted in the lower lobes of both pulmonary arteries, right greater than left. RV/LV ratio of 0.85 is noted which is within normal limits. Cardiomegaly is noted without pericardial effusion. Mediastinum/Nodes: Moderate size hiatal hernia is noted. No adenopathy is noted. Bony thorax is unremarkable. Lungs/Pleura: No pneumothorax or significant pleural effusion is noted. Minimal bilateral posterior basilar subsegmental atelectasis is noted. Upper Abdomen: No acute abnormality. Musculoskeletal: No chest wall abnormality. No acute or  significant osseous findings. Review of the MIP images confirms the above findings. IMPRESSION: Large bilateral pulmonary emboli are noted. Critical Value/emergent results were called by telephone at the time of interpretation on 05/12/2018 at 8:47 am to Garner Nash, RN, who verbally acknowledged these results and will contact the ordering physician. Moderate size hiatal hernia is noted. Electronically Signed   By: Marijo Conception, M.D.   On: 05/12/2018 08:48   Dg Chest Portable 1 View  Result Date: 05/10/2018 CLINICAL DATA:  Shortness of breath tonight. EXAM: PORTABLE CHEST 1 VIEW COMPARISON:  04/06/2018 FINDINGS: Slightly shallow inspiration. Mild cardiac enlargement. Bilateral perihilar infiltration may indicate edema or multifocal pneumonia. This is progressing since previous study. Possible small left pleural effusion. No pneumothorax. Mediastinal contours appear intact. Postoperative changes in the cervical spine.  Degenerative changes in the shoulders. IMPRESSION: Bilateral perihilar infiltrates, progressing since previous study. Edema versus pneumonia. Possible small left pleural effusion. Electronically Signed   By: Lucienne Capers M.D.   On: 05/10/2018 22:44     TODAY-DAY OF DISCHARGE:  Subjective:   Jonathan Dickson today has no headache,no chest abdominal pain,no new weakness tingling or numbness, feels much better wants to go home today.  Objective:   Blood pressure (!) 142/98, pulse 95, temperature 97.6 F (36.4 C), temperature source Oral, resp. rate 18, height 5\' 6"  (1.676 m), weight 72.2 kg, SpO2 95 %.  Intake/Output Summary (Last 24 hours) at 05/14/2018 0948 Last data filed at 05/14/2018 0744 Gross per 24 hour  Intake 550.05 ml  Output 800 ml  Net -249.95 ml   Filed Weights   05/12/18 0346 05/13/18 0330 05/14/18 0601  Weight: 72.2 kg 72.4 kg 72.2 kg    Exam: Awake Alert, Oriented *3, No new F.N deficits, Normal affect .AT,PERRAL Supple Neck,No JVD, No cervical lymphadenopathy appriciated.  Symmetrical Chest wall movement, Good air movement bilaterally, CTAB RRR,No Gallops,Rubs or new Murmurs, No Parasternal Heave +ve B.Sounds, Abd Soft, Non tender, No organomegaly appriciated, No rebound -guarding or rigidity. No Cyanosis, Clubbing or edema, No new Rash or bruise   PERTINENT RADIOLOGIC STUDIES: Ct Angio Chest Pe W And/or Wo Contrast  Result Date: 05/12/2018 CLINICAL DATA:  Shortness of breath. EXAM: CT ANGIOGRAPHY CHEST WITH CONTRAST TECHNIQUE: Multidetector CT imaging of the chest was performed using the standard protocol during bolus administration of intravenous contrast. Multiplanar CT image reconstructions and MIPs were obtained to evaluate the vascular anatomy. CONTRAST:  65 mL ISOVUE-370 IOPAMIDOL (ISOVUE-370) INJECTION 76% COMPARISON:  Radiograph of May 10, 2018. CT scan of April 06, 2018. FINDINGS: Cardiovascular: Large filling defects are noted in the lower lobes of  both pulmonary arteries, right greater than left. RV/LV ratio of 0.85 is noted which is within normal limits. Cardiomegaly is noted without pericardial effusion. Mediastinum/Nodes: Moderate size hiatal hernia is noted. No adenopathy is noted. Bony thorax is unremarkable. Lungs/Pleura: No pneumothorax or significant pleural effusion is noted. Minimal bilateral posterior basilar subsegmental atelectasis is noted. Upper Abdomen: No acute abnormality. Musculoskeletal: No chest wall abnormality. No acute or significant osseous findings. Review of the MIP images confirms the above findings. IMPRESSION: Large bilateral pulmonary emboli are noted. Critical Value/emergent results were called by telephone at the time of interpretation on 05/12/2018 at 8:47 am to Garner Nash, RN, who verbally acknowledged these results and will contact the ordering physician. Moderate size hiatal hernia is noted. Electronically Signed   By: Marijo Conception, M.D.   On: 05/12/2018 08:48   Dg Chest Portable 1 View  Result Date:  05/10/2018 CLINICAL DATA:  Shortness of breath tonight. EXAM: PORTABLE CHEST 1 VIEW COMPARISON:  04/06/2018 FINDINGS: Slightly shallow inspiration. Mild cardiac enlargement. Bilateral perihilar infiltration may indicate edema or multifocal pneumonia. This is progressing since previous study. Possible small left pleural effusion. No pneumothorax. Mediastinal contours appear intact. Postoperative changes in the cervical spine. Degenerative changes in the shoulders. IMPRESSION: Bilateral perihilar infiltrates, progressing since previous study. Edema versus pneumonia. Possible small left pleural effusion. Electronically Signed   By: Lucienne Capers M.D.   On: 05/10/2018 22:44     PERTINENT LAB RESULTS: CBC: Recent Labs    05/13/18 0035 05/14/18 0318  WBC 11.6* 7.9  HGB 16.6 15.0  HCT 50.5 45.2  PLT 224 205   CMET CMP     Component Value Date/Time   NA 135 05/14/2018 0318   NA 138 12/24/2017 1129   K  3.7 05/14/2018 0318   CL 103 05/14/2018 0318   CO2 23 05/14/2018 0318   GLUCOSE 111 (H) 05/14/2018 0318   BUN 16 05/14/2018 0318   BUN 24 12/24/2017 1129   CREATININE 0.94 05/14/2018 0318   CREATININE 0.98 03/21/2016 0953   CALCIUM 8.9 05/14/2018 0318   PROT 7.0 05/11/2018 0223   PROT 7.4 12/24/2017 1129   ALBUMIN 3.7 05/11/2018 0223   ALBUMIN 4.5 12/24/2017 1129   AST 45 (H) 05/11/2018 0223   ALT 22 05/11/2018 0223   ALKPHOS 70 05/11/2018 0223   BILITOT 1.5 (H) 05/11/2018 0223   BILITOT 0.4 12/24/2017 1129   GFRNONAA >60 05/14/2018 0318   GFRNONAA >89 10/04/2015 1708   GFRAA >60 05/14/2018 0318   GFRAA >89 10/04/2015 1708    GFR Estimated Creatinine Clearance: 72.6 mL/min (by C-G formula based on SCr of 0.94 mg/dL). No results for input(s): LIPASE, AMYLASE in the last 72 hours. Recent Labs    05/11/18 1400  TROPONINI 0.48*   Invalid input(s): POCBNP No results for input(s): DDIMER in the last 72 hours. No results for input(s): HGBA1C in the last 72 hours. No results for input(s): CHOL, HDL, LDLCALC, TRIG, CHOLHDL, LDLDIRECT in the last 72 hours. No results for input(s): TSH, T4TOTAL, T3FREE, THYROIDAB in the last 72 hours.  Invalid input(s): FREET3 No results for input(s): VITAMINB12, FOLATE, FERRITIN, TIBC, IRON, RETICCTPCT in the last 72 hours. Coags: No results for input(s): INR in the last 72 hours.  Invalid input(s): PT Microbiology: Recent Results (from the past 240 hour(s))  MRSA PCR Screening     Status: None   Collection Time: 05/11/18 10:38 PM  Result Value Ref Range Status   MRSA by PCR NEGATIVE NEGATIVE Final    Comment:        The GeneXpert MRSA Assay (FDA approved for NASAL specimens only), is one component of a comprehensive MRSA colonization surveillance program. It is not intended to diagnose MRSA infection nor to guide or monitor treatment for MRSA infections. Performed at [facility], [place] [address] [place],  [address]       FURTHER DISCHARGE INSTRUCTIONS:  Get Medicines reviewed and adjusted: Please take all your medications with you for your next visit with your Primary MD  Laboratory/radiological data: Please request your Primary MD to go over all hospital tests and procedure/radiological results at the follow up, please ask your Primary MD to get all Hospital records sent to his/her office.  In some cases, they will be blood work, cultures and biopsy results pending at the time of your discharge. Please request that your primary care M.D.  goes through all the records of your hospital data and follows up on these results.  Also Note the following: If you experience worsening of your admission symptoms, develop shortness of breath, life threatening emergency, suicidal or homicidal thoughts you must seek medical attention immediately by calling 911 or calling your MD immediately  if symptoms less severe.  You must read complete instructions/literature along with all the possible adverse reactions/side effects for all the Medicines you take and that have been prescribed to you. Take any new Medicines after you have completely understood and accpet all the possible adverse reactions/side effects.   Do not drive when taking Pain medications or sleeping medications (Benzodaizepines)  Do not take more than prescribed Pain, Sleep and Anxiety Medications. It is not advisable to combine anxiety,sleep and pain medications without talking with your primary care practitioner  Special Instructions: If you have smoked or chewed Tobacco  in the last 2 yrs please stop smoking, stop any regular Alcohol  and or any Recreational drug use.  Wear Seat belts while driving.  Please note: You were cared for by a hospitalist during your hospital stay. Once you are discharged, your primary care physician will handle any further medical issues. Please note that NO REFILLS for any discharge medications will be authorized once you  are discharged, as it is imperative that you return to your primary care physician (or establish a relationship with a primary care physician if you do not have one) for your post hospital discharge needs so that they can reassess your need for medications and monitor your lab values.  Total Time spent coordinating discharge including counseling, education and face to face time equals  45 minutes.  SignedOren Binet 05/14/2018 9:48 AM

## 2018-05-14 NOTE — Progress Notes (Addendum)
Advanced Heart Failure Rounding Note  PCP-Cardiologist: Quay Burow, MD   Subjective:    Admitted  with ADHF and CP.  CTA -with bilateral PE. Now on Eliquis. Denies bleeding. No CP or SOB currently.   Yesterday lasix stopped with AKI. Creatinine now normalized.   Denies SOB/CP. Wants to go home. No dizziness or presyncope.     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 RV normal without evidence of acute strain. LE u/s without DVT.  Objective:   Weight Range: 72.2 kg Body mass index is 25.68 kg/m.   Vital Signs:   Temp:  [97.5 F (36.4 C)-98.9 F (37.2 C)] 97.6 F (36.4 C) (08/15 0743) Pulse Rate:  [87-104] 95 (08/15 0743) Resp:  [11-20] 18 (08/15 0743) BP: (122-155)/(81-110) 142/98 (08/15 0743) SpO2:  [92 %-97 %] 95 % (08/15 0743) Weight:  [72.2 kg] 72.2 kg (08/15 0601) Last BM Date: 05/10/18  Weight change: Filed Weights   05/12/18 0346 05/13/18 0330 05/14/18 0601  Weight: 72.2 kg 72.4 kg 72.2 kg    Intake/Output:   Intake/Output Summary (Last 24 hours) at 05/14/2018 0806 Last data filed at 05/14/2018 0744 Gross per 24 hour  Intake 790.05 ml  Output 900 ml  Net -109.95 ml      Physical Exam    General:  Well appearing. No resp difficulty HEENT: normal Neck: supple. no JVD. Carotids 2+ bilat; no bruits. No lymphadenopathy or thryomegaly appreciated. Cor: PMI laterally displaced. Regular rate & rhythm. No rubs, gallops or murmurs. Lungs: clear Abdomen: soft, nontender, nondistended. No hepatosplenomegaly. No bruits or masses. Good bowel sounds. Extremities: no cyanosis, clubbing, rash, edema Neuro: alert & orientedx3, cranial nerves grossly intact. moves all 4 extremities w/o difficulty. Affect pleasant   Telemetry    NSR 80-90s  Personally reviewed  EKG    Sinus tach 109, personally reviewed.   Labs    CBC Recent Labs    05/13/18 0035 05/14/18 0318  WBC 11.6* 7.9  HGB 16.6 15.0  HCT 50.5 45.2  MCV  85.4 84.8  PLT 224 947   Basic Metabolic Panel Recent Labs    05/13/18 0035 05/14/18 0318  NA 135 135  K 4.7 3.7  CL 99 103  CO2 22 23  GLUCOSE 120* 111*  BUN 27* 16  CREATININE 1.68* 0.94  CALCIUM 8.8* 8.9  MG 2.3  --    Liver Function Tests No results for input(s): AST, ALT, ALKPHOS, BILITOT, PROT, ALBUMIN in the last 72 hours. No results for input(s): LIPASE, AMYLASE in the last 72 hours. Cardiac Enzymes Recent Labs    05/11/18 1400  TROPONINI 0.48*    BNP: BNP (last 3 results) Recent Labs    12/24/17 1129 02/16/18 1620 05/10/18 2213  BNP 6.9 16.3 2,019.9*    ProBNP (last 3 results) No results for input(s): PROBNP in the last 8760 hours.   D-Dimer No results for input(s): DDIMER in the last 72 hours. Hemoglobin A1C No results for input(s): HGBA1C in the last 72 hours. Fasting Lipid Panel No results for input(s): CHOL, HDL, LDLCALC, TRIG, CHOLHDL, LDLDIRECT in the last 72 hours. Thyroid Function Tests No results for input(s): TSH, T4TOTAL, T3FREE, THYROIDAB in the last 72 hours.  Invalid input(s): FREET3  Other results:   Imaging    No results found.   Medications:     Scheduled Medications: . apixaban  10 mg Oral BID   Followed by  . [START ON 05/20/2018] apixaban  5 mg  Oral BID  . aspirin  81 mg Oral Daily  . carvedilol  6.25 mg Oral BID WC  . insulin aspart  0-9 Units Subcutaneous TID WC  . sacubitril-valsartan  1 tablet Oral BID  . sodium chloride flush  3 mL Intravenous Q12H  . sodium chloride flush  3 mL Intravenous Q12H    Infusions: . sodium chloride 10 mL/hr at 05/12/18 0655    PRN Medications:      Patient Profile   Jonathan Dickson is a 62 y.o. male with PMH of chronic systolic CHF, NICM, Cardiac amyloidosis, DM2, HTN, previous polysubstance abuse - including heroin (quit 2004) and depression.  Presented to [facility] 05/10/18 with acute SOB.   Assessment/Plan   1. Acute on chronic Systolic Heart Failure, NICM. 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 IFE normal. SPEP pending. If negative. Proceed with tafamadis therapy - Lasix stopped 8/14 with AKI.  Appears euvolemic. Will need lasix as needed at home for 3 pound weight gain. .   - Continue Entresto 49/51 mg BID. Cut back at outpatient visit due to hypotension.  - Continue coreg top 6.25 mg BID. Chronotropic incompetence noted on CPX.  - Have referred as outpatient to EP to discuss ICD. May be reasonable to wait until we see response to tafamadis before making final decision however.  - RHC on hold with new pulmonary emboli.   2. Chest pain with mildly elevated troponin - normal coronaries by cath 2017 - Troponin low with flat trend. ECG ok. Not ACS. Likely due to HF -  CT scan with bilateral PE. No further chest pain.  On elqius  3.DMII - Per primary.  - Consider jardiance when BP imrpoved  4. HTN - Stable.   5. Bilateral PE CTA-large bilateral PE. Discussed results On eliquis 10 mg twice a day x 7 days then 5 mg twice a day.   6. AkI In the setting diuresis.  Creatinine peaked 1.7. Today creatinine down to 0.9   Meds for home Eliquis 10 mg twice a for 7 days then on 8/22 he will cut back to  5 mg twice a day.  Carvedilol 6.25 mg twice a day (this is decreased dose) Entresto 49-51 mg twice a day   Lasix 40 mg as needed for 3 pound weight gain.   HF follow up will be set up.   Medication concerns reviewed with patient and pharmacy team. Barriers identified: None at this time.   Length of Stay: 3  Darrick Grinder, NP  05/14/2018, 8:06 AM  Advanced Heart Failure Team Pager [phone] (M-F; 7a - 4p)   Patient seen and examined with Darrick Grinder, NP. We discussed all aspects of the encounter. I agree with the assessment and plan as stated above.   He  looks good today. Breathing is stable. No orthopnea or PND. Tolerating Eliquis. Volume status ok. Ok to go home today. Will follow closely in HF clinic. Tafamdis to be initiated as an outpatient. Meds as above. Discussed with Dr. Nigel Bridgeman.   [name], MD  8:50 AM

## 2018-05-14 NOTE — Progress Notes (Signed)
Patient in a stable condition, discharge education reviewed with patient he verbalized understanding, iv removed, tele dc ccmd notified, patient belongings at bedside, patient awaiting his sister for transportation home.

## 2018-05-21 ENCOUNTER — Encounter (HOSPITAL_COMMUNITY): Payer: Self-pay

## 2018-05-21 ENCOUNTER — Ambulatory Visit (HOSPITAL_COMMUNITY)
Admit: 2018-05-21 | Discharge: 2018-05-21 | Disposition: A | Discharge status: Home / Self Care | Payer: Medicaid Other | Attending: Internal Medicine | Admitting: Internal Medicine

## 2018-05-21 VITALS — BP 144/96 | HR 98 | Wt 164.0 lb

## 2018-05-21 DIAGNOSIS — I10 Essential (primary) hypertension: Secondary | ICD-10-CM

## 2018-05-21 DIAGNOSIS — Z7984 Long term (current) use of oral hypoglycemic drugs: Secondary | ICD-10-CM | POA: Diagnosis not present

## 2018-05-21 DIAGNOSIS — I5042 Chronic combined systolic (congestive) and diastolic (congestive) heart failure: Secondary | ICD-10-CM | POA: Insufficient documentation

## 2018-05-21 DIAGNOSIS — F419 Anxiety disorder, unspecified: Secondary | ICD-10-CM | POA: Diagnosis not present

## 2018-05-21 DIAGNOSIS — I428 Other cardiomyopathies: Secondary | ICD-10-CM | POA: Diagnosis not present

## 2018-05-21 DIAGNOSIS — Z7901 Long term (current) use of anticoagulants: Secondary | ICD-10-CM | POA: Diagnosis not present

## 2018-05-21 DIAGNOSIS — E119 Type 2 diabetes mellitus without complications: Secondary | ICD-10-CM | POA: Diagnosis not present

## 2018-05-21 DIAGNOSIS — N179 Acute kidney failure, unspecified: Secondary | ICD-10-CM | POA: Diagnosis not present

## 2018-05-21 DIAGNOSIS — I110 Hypertensive heart disease with heart failure: Secondary | ICD-10-CM | POA: Diagnosis not present

## 2018-05-21 DIAGNOSIS — Z79899 Other long term (current) drug therapy: Secondary | ICD-10-CM | POA: Diagnosis not present

## 2018-05-21 DIAGNOSIS — I429 Cardiomyopathy, unspecified: Secondary | ICD-10-CM | POA: Diagnosis not present

## 2018-05-21 DIAGNOSIS — F329 Major depressive disorder, single episode, unspecified: Secondary | ICD-10-CM | POA: Insufficient documentation

## 2018-05-21 DIAGNOSIS — I2699 Other pulmonary embolism without acute cor pulmonale: Secondary | ICD-10-CM | POA: Diagnosis not present

## 2018-05-21 DIAGNOSIS — Z87891 Personal history of nicotine dependence: Secondary | ICD-10-CM | POA: Diagnosis not present

## 2018-05-21 DIAGNOSIS — I5022 Chronic systolic (congestive) heart failure: Secondary | ICD-10-CM | POA: Diagnosis present

## 2018-05-21 LAB — BASIC METABOLIC PANEL
Anion gap: 8 (ref 5–15)
BUN: 18 mg/dL (ref 8–23)
CALCIUM: 9.1 mg/dL (ref 8.9–10.3)
CO2: 21 mmol/L — ABNORMAL LOW (ref 22–32)
Chloride: 107 mmol/L (ref 98–111)
Creatinine, Ser: 1.2 mg/dL (ref 0.61–1.24)
GLUCOSE: 93 mg/dL (ref 70–99)
Potassium: 4.2 mmol/L (ref 3.5–5.1)
SODIUM: 136 mmol/L (ref 135–145)

## 2018-05-21 LAB — CBC
HCT: 45.1 % (ref 39.0–52.0)
Hemoglobin: 14.6 g/dL (ref 13.0–17.0)
MCH: 28.2 pg (ref 26.0–34.0)
MCHC: 32.4 g/dL (ref 30.0–36.0)
MCV: 87.2 fL (ref 78.0–100.0)
PLATELETS: 234 10*3/uL (ref 150–400)
RBC: 5.17 MIL/uL (ref 4.22–5.81)
RDW: 13.7 % (ref 11.5–15.5)
WBC: 6.1 10*3/uL (ref 4.0–10.5)

## 2018-05-21 LAB — BRAIN NATRIURETIC PEPTIDE: B NATRIURETIC PEPTIDE 5: 149.2 pg/mL — AB (ref 0.0–100.0)

## 2018-05-21 MED ORDER — CARVEDILOL 6.25 MG PO TABS: 9.3750 mg | ORAL_TABLET | Freq: Two times a day (BID) | ORAL | 3 refills | Status: DC

## 2018-05-21 NOTE — Patient Instructions (Signed)
Labs today (will call for abnormal results, otherwise no news is good news)  INCREASE Carvedilol to 9.375 mg (1.5 Tablets) Twice Daily  Continue taking Eliquis 5 mg Twice Daily  Follow up as scheduled.

## 2018-05-21 NOTE — Progress Notes (Signed)
ADVANCED HF CLINIC Referring Physician: Dr [name]  Primary Care: Dr Wynetta Emery at [facility]  Primary Cardiologist: Dr [name]  HPI: Jonathan Dickson is a 62 year old with a HTN, previous polysubstance abuse - including heroin (quit 2004) and depression, heart failure, amyloidosis, and PE.    Had Myoview in 5/17 with EF 22%. Subsequently underwent cath 6/17. EF 40-45%. Normal coronaries   Admitted to [facility] with increased dyspnea. Diuresed with IV. CTA performed and showed bilateral PE. Had AKI so diuretics held.  Placed on eliquis for PE.    Today he returns for post hospital follow up. Overall feeling fine. SOB with steps and inclines. Denies PND/Orthopnea. Deneis BRBPR.  Appetite ok. No fever or chills. Weight at home 159-162  pounds. Taking all medications. Continues to work in his [employer]. He is also working on his business degree. Lives alone.   Cardiac Testing CTA 05/12/2018 --.large bilateral  PE   CPX 04/03/18 FVC 2.77 (83%)    FEV1 2.37 (91%)     FEV1/FVC 86 (109%)     MVV 76 (61%)  Resting HR: 83 Peak HR: 125  (80% age predicted max HR) BP rest: 144/84 BP peak: 168/72 Peak VO2: 23.1 (76% predicted peak VO2) VE/VCO2 slope: 41 OUES: 2.18 Peak RER: 0.93 VE/MVV: 88% O2pulse: 14  (100% predicted O2pulse)  PYP scan obtained which was positive (Visual grade 2. Quantitative 1.57).  cMRI 04/01/18 1. Mild LVE with mild LVH septal thickness 13 mm. Diffuse hypokinesis worse in the septum apex and lateral walls EF 28% 2. 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 RV normal without evidence of acute strain. LE u/s without DVT.  ECHO 12/2017   Left ventricle: EF 30% to 35% (I felt closer to  40-45%). Mild LVH Diffuse hypokinesis. Doppler   parameters are consistent with abnormal left ventricular   relaxation (grade 1 diastolic dysfunction).  ECHO EF 08/2017  Left ventricle: EF 50% to 55%.     Past Medical History:  Diagnosis Date  . Abnormal TSH 02/2016  . Anxiety   . Cervical disc disease   . Chronic combined systolic and diastolic CHF (congestive heart failure) ([place])    a. 01/2016 Echo: EF 25%, inf AK, diffuse sev HK, Gr1 DD, mild Jonathan.  . Depression   . Hyperglycemia   . Hypertension   . Hypertensive heart disease   . Myocardial infarction [facility])    ??  maybe  . NICM (nonischemic cardiomyopathy) ([place])    a. 01/2016 Echo: EF 25%, inf AK, diffuse sev HK, Gr1 DD, mild Jonathan; b. 01/2016 MV: EF 22%, no isch/infarct;  c. 02/2016 Cath: Nl cors, EF 35-45%.  . Pre-diabetes   . Seizures ([place])    with penicillin    Current Outpatient Medications  Medication Sig Dispense Refill  . amitriptyline (ELAVIL) 25 MG tablet Take 1 tablet (25 mg total) by mouth at bedtime as needed for sleep. 30 tablet 3  . apixaban (ELIQUIS) 5 MG TABS tablet Take 1 tablet (5 mg total) by mouth 2 (two) times daily. 60 tablet 0  . carvedilol (COREG) 6.25 MG tablet Take 1 tablet (6.25 mg total) by mouth 2 (two) times daily with a meal. 60 tablet 0  . cholecalciferol (VITAMIN D) 1000 units tablet Take 1,000 Units  by mouth daily.    . furosemide (LASIX) 40 MG tablet Take 1 tablet (40 mg total) by mouth daily as needed for fluid (if weight gain more than 3 punds). 30 tablet 0  . loratadine (CLARITIN) 10 MG tablet Take 1 tablet (10 mg total) by mouth daily. 30 tablet 3  . metFORMIN (GLUCOPHAGE XR) 500 MG 24 hr tablet Take 1 tablet (500 mg total) by mouth daily with breakfast. 90 tablet 0  . sacubitril-valsartan (ENTRESTO) 49-51 MG Take 1 tablet by mouth 2 (two) times daily. 60 tablet 3  . sildenafil (VIAGRA) 25 MG tablet Take 1 tablet (25 mg total) by mouth daily as needed for erectile dysfunction. 10 tablet 6    No current facility-administered medications for this encounter.     Allergies  Allergen Reactions  . Peanut-Containing Drug Products Swelling  . Penicillins Other (See Comments)    CONVULSIONS Has patient had a PCN reaction causing immediate rash, facial/tongue/throat swelling, SOB or lightheadedness with hypotension: No Has patient had a PCN reaction causing severe rash involving mucus membranes or skin necrosis: No Has patient had a PCN reaction that required hospitalization No Has patient had a PCN reaction occurring within the last 10 years: No If all of the above answers are "NO", then may proceed with Cephalosporin use.   . Decadron [Dexamethasone] Itching      Social History   Socioeconomic History  . Marital status: Legally Separated    Spouse name: Not on file  . Number of children: 3  . Years of education: Not on file  . Highest education level: Not on file  Occupational History  . Not on file  Social Needs  . Financial resource strain: Not on file  . Food insecurity:    Worry: Not on file    Inability: Not on file  . Transportation needs:    Medical: Not on file    Non-medical: Not on file  Tobacco Use  . Smoking status: Former Smoker    Packs/day: 1.50    Years: 8.00    Pack years: 12.00    Last attempt to quit: 03/15/2003    Years since quitting: 15.1  . Smokeless tobacco: Never Used  Substance and Sexual Activity  . Alcohol use: No    Comment: none since 2004  . Drug use: No    Comment: former  none since 2004  . Sexual activity: Not Currently  Lifestyle  . Physical activity:    Days per week: Not on file    Minutes per session: Not on file  . Stress: Not on file  Relationships  . Social connections:    Talks on phone: Not on file    Gets together: Not on file    Attends religious service: Not on file    Active member of club or organization: Not on file    Attends meetings of clubs or organizations: Not on file    Relationship status:  Not on file  . Intimate partner violence:    Fear of current or ex partner: Not on file    Emotionally abused: Not on file    Physically abused: Not on file    Forced sexual activity: Not on file  Other Topics Concern  . Not on file  Social History Narrative  . Not on file      Family History  Problem Relation Age of Onset  . Diabetes Mother   . Diabetes Sister     Vitals:   05/21/18  0851  BP: (!) 144/96  Pulse: 98  SpO2: 99%  Weight: 74.4 kg (164 lb)    PHYSICAL EXAM: General:  Walked into clinic. No resp difficulty Mildly diaphorectic HEENT: normal Neck: supple. no JVD. Carotids 2+ bilat; no bruits. No lymphadenopathy or thryomegaly appreciated. Cor: PMI nondisplaced. Regular rate & rhythm. No rubs, gallops or murmurs. Lungs: clear Abdomen: soft, nontender, nondistended. No hepatosplenomegaly. No bruits or masses. Good bowel sounds. Extremities: no cyanosis, clubbing, rash, edema Neuro: alert & orientedx3, cranial nerves grossly intact. moves all 4 extremities w/o difficulty. Affect pleasant  ASSESSMENT & PLAN: 1. Chronic Systolic Heart Failure, [facility]. 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 Will need SPEP and IFE. If negative. Proceed with tafamadis therapy - CPX 5/19 pVO2 23 but slope 41 - NYHA II-III - Volume status stable. Continue lasix 40 mg as needed for 3 pound weight gain.  -Increase carvedilol 9.375 mg twice a day.  -Entresto 49-51 mg twice a day      2.DMII - Stable On metformin -needs follow up with PCP.   3. HTN Elevated. Increasing BB as noted above.   4. PE CTA 04/2018 with large bilateral PE On eliqus 10 mg twice a day for 7 days. Today he will cut back eliquis to 5 mg twice a day.   5. AKI Recent hsoptalization creatinine peaked at 1.7. Check BMET today.   Follow up in 3-4 weeks.  BMET, CBC,  BNP obtained today. Renal function stable, hemoglobin stable, and BNP coming down.   Greater than 50% of the 25 minute visit was spent in counseling/coordination of care regarding disease state education, salt/fluid restriction, sliding scale diuretics, and medication compliance.     Darrick Grinder, NP  9:11 AM

## 2018-05-26 ENCOUNTER — Encounter: Payer: Self-pay | Admitting: Cardiology

## 2018-05-27 ENCOUNTER — Ambulatory Visit: Payer: Medicaid Other | Admitting: Cardiology

## 2018-05-27 ENCOUNTER — Encounter: Payer: Self-pay | Admitting: Cardiology

## 2018-05-27 VITALS — BP 138/92 | HR 99 | Ht 66.0 in | Wt 166.0 lb

## 2018-05-27 DIAGNOSIS — I5042 Chronic combined systolic (congestive) and diastolic (congestive) heart failure: Secondary | ICD-10-CM

## 2018-05-27 DIAGNOSIS — I2699 Other pulmonary embolism without acute cor pulmonale: Secondary | ICD-10-CM

## 2018-05-27 DIAGNOSIS — I10 Essential (primary) hypertension: Secondary | ICD-10-CM

## 2018-05-27 DIAGNOSIS — I428 Other cardiomyopathies: Secondary | ICD-10-CM | POA: Diagnosis not present

## 2018-05-27 NOTE — Patient Instructions (Addendum)
Medication Instructions:  Your physician recommends that you continue on your current medications as directed. Please refer to the Current Medication list given to you today.  Labwork: None ordered.  Testing/Procedures: Your physician has recommended that you have a defibrillator inserted. An implantable cardioverter defibrillator (ICD) is a small device that is placed in your chest or, in rare cases, your abdomen. This device uses electrical pulses or shocks to help control life-threatening, irregular heartbeats that could lead the heart to suddenly stop beating (sudden cardiac arrest). Leads are attached to the ICD that goes into your heart. This is done in the hospital and usually requires an overnight stay. Please see the instruction sheet given to you today for more information.  Follow-Up: You will follow up with device clinic 10-14 days after your procedure for a wound check.  You will follow up with Dr. Curt Bears 91 days after your procedure.   ICD insertion instructions:  Please arrive at the [facility] main entrance of [facility] hospital at:  10:30 am on June 17, 2018  Use the CHG scrub as directed  Do not eat or drink after midnight prior to procedure  Do not take your ELIQUIS for the night before and the morning of your procedure.  On the morning of your procedure you may take your normal morning medications EXCEPT:  NO LASIX, METFORMIN or [place] for one night stay  You will need someone to drive you home at discharge  If you need a refill on your cardiac medications before your next appointment, please call your pharmacy.   Cardioverter Defibrillator Implantation An implantable cardioverter defibrillator (ICD) is a small device that is placed under the skin in the chest or abdomen. An ICD consists of a battery, a small computer (pulse generator), and wires (leads) that go into the heart. An ICD is used to detect and correct two types of dangerous irregular  heartbeats (arrhythmias):  A rapid heart rhythm (tachycardia).  An arrhythmia in which the lower chambers of the heart (ventricles) contract in an uncoordinated way (fibrillation).  When an ICD detects tachycardia, it sends a low-energy shock to the heart to restore the heartbeat to normal (cardioversion). This signal is usually painless. If cardioversion does not work or if the ICD detects fibrillation, it delivers a high-energy shock to the heart (defibrillation) to restart the heart. This shock may feel like a strong jolt in the chest. Your health care provider may prescribe an ICD if:  You have had an arrhythmia that originated in the ventricles.  Your heart has been damaged by a disease or heart condition.  Sometimes, ICDs are programmed to act as a device called a pacemaker. Pacemakers can be used to treat a slow heartbeat (bradycardia) or tachycardia by taking over the heart rate with electrical impulses. Tell a health care provider about:  Any allergies you have.  All medicines you are taking, including vitamins, herbs, eye drops, creams, and over-the-counter medicines.  Any problems you or family members have had with anesthetic medicines.  Any blood disorders you have.  Any surgeries you have had.  Any medical conditions you have.  Whether you are pregnant or may be pregnant. What are the risks? Generally, this is a safe procedure. However, problems may occur, including:  Swelling, bleeding, or bruising.  Infection.  Blood clots.  Damage to other structures or organs, such as nerves, blood vessels, or the heart.  Allergic reactions to medicines used during the procedure.  What happens before the  procedure? Staying hydrated Follow instructions from your health care provider about hydration, which may include:  Up to 2 hours before the procedure - you may continue to drink clear liquids, such as water, clear fruit juice, black coffee, and plain tea.  Eating and  drinking restrictions Follow instructions from your health care provider about eating and drinking, which may include:  8 hours before the procedure - stop eating heavy meals or foods such as meat, fried foods, or fatty foods.  6 hours before the procedure - stop eating light meals or foods, such as toast or cereal.  6 hours before the procedure - stop drinking milk or drinks that contain milk.  2 hours before the procedure - stop drinking clear liquids.  Medicine Ask your health care provider about:  Changing or stopping your normal medicines. This is important if you take diabetes medicines or blood thinners.  Taking medicines such as aspirin and ibuprofen. These medicines can thin your blood. Do not take these medicines before your procedure if your doctor tells you not to.  Tests  You may have blood tests.  You may have a test to check the electrical signals in your heart (electrocardiogram, ECG).  You may have imaging tests, such as a chest X-ray. General instructions  For 24 hours before the procedure, stop using products that contain nicotine or tobacco, such as cigarettes and e-cigarettes. If you need help quitting, ask your health care provider.  Plan to have someone take you home from the hospital or clinic.  You may be asked to shower with a germ-killing soap. What happens during the procedure?  To reduce your risk of infection: ? Your health care team will wash or sanitize their hands. ? Your skin will be washed with soap. ? Hair may be removed from the surgical area.  Small monitors will be put on your body. They will be used to check your heart, blood pressure, and oxygen level.  An IV tube will be inserted into one of your veins.  You will be given one or more of the following: ? A medicine to help you relax (sedative). ? A medicine to numb the area (local anesthetic). ? A medicine to make you fall asleep (general anesthetic).  Leads will be guided  through a blood vessel into your heart and attached to your heart muscles. Depending on the ICD, the leads may go into one ventricle or they may go into both ventricles and into an upper chamber of the heart. An X-ray machine (fluoroscope) will be usedto help guide the leads.  A small incision will be made to create a deep pocket under your skin.  The pulse generator will be placed into the pocket.  The ICD will be tested.  The incision will be closed with stitches (sutures), skin glue, or staples.  A bandage (dressing) will be placed over the incision. This procedure may vary among health care providers and hospitals. What happens after the procedure?  Your blood pressure, heart rate, breathing rate, and blood oxygen level will be monitored often until the medicines you were given have worn off.  A chest X-ray will be taken to check that the ICD is in the right place.  You will need to stay in the hospital for 1-2 days so your health care provider can make sure your ICD is working.  Do not drive for 24 hours if you received a sedative. Ask your health care provider when it is safe for you to  drive.  You may be given an identification card explaining that you have an ICD. Summary  An implantable cardioverter defibrillator (ICD) is a small device that is placed under the skin in the chest or abdomen. It is used to detect and correct dangerous irregular heartbeats (arrhythmias).  An ICD consists of a battery, a small computer (pulse generator), and wires (leads) that go into the heart.  When an ICD detects rapid heart rhythm (tachycardia), it sends a low-energy shock to the heart to restore the heartbeat to normal (cardioversion). If cardioversion does not work or if the ICD detects uncoordinated heart contractions (fibrillation), it delivers a high-energy shock to the heart (defibrillation) to restart the heart.  You will need to stay in the hospital for 1-2 days to make sure your ICD  is working. This information is not intended to replace advice given to you by your health care provider. Make sure you discuss any questions you have with your health care provider. Document Released: 06/08/2002 Document Revised: 09/25/2016 Document Reviewed: 09/25/2016 Elsevier Interactive Patient Education  2017 [employer].

## 2018-05-27 NOTE — Progress Notes (Signed)
Electrophysiology Office Note   Date:  05/27/2018   ID:  Jonathan Dickson, Jonathan Dickson Feb 21, 1956, MRN [id number]  PCP:  Ladell Pier, MD  Cardiologist:  [place] Primary Electrophysiologist:  Cristiana Yochim Meredith Leeds, MD    No chief complaint on file.    History of Present Illness: Jonathan Dickson is a 62 y.o. male who is being seen today for the evaluation of CHF at the request of [name]. Presenting today for electrophysiology evaluation.  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  He had a left heart catheterization that was essentially normal.  Cardiac MRI was performed that showed an EF of 28% suggestive of amyloidosis.  He is currently on optimal medical therapy.  Is in today for evaluation of ICD implant.  He is currently feeling well without major complaint.  He was admitted to the hospital with discharge 05/14/2018 with shortness of breath.  He was found to have a large bilateral pulmonary embolism and was put on Eliquis.  He is felt well since that time.  He has had no further episodes of shortness of breath.    Today, he denies symptoms of palpitations, chest pain, shortness of breath, orthopnea, PND, lower extremity edema, claudication, dizziness, presyncope, syncope, bleeding, or neurologic sequela. The patient is tolerating medications without difficulties.    Past Medical History:  Diagnosis Date  . Abnormal TSH 02/2016  . Anxiety   . Cervical disc disease   . Chronic combined systolic and diastolic CHF (congestive heart failure) ([place])    a. 01/2016 Echo: EF 25%, inf AK, diffuse sev HK, Gr1 DD, mild MR.  . Depression   . Hyperglycemia   . Hypertension   . Hypertensive heart disease   . Myocardial infarction [facility])    ??  maybe  . NICM (nonischemic cardiomyopathy) ([place])    a. 01/2016 Echo: EF 25%, inf AK, diffuse sev HK, Gr1 DD, mild MR; b. 01/2016 MV: EF  22%, no isch/infarct;  c. 02/2016 Cath: Nl cors, EF 35-45%.  . Pre-diabetes   . Seizures ([place])    with penicillin   Past Surgical History:  Procedure Laterality Date  . ANTERIOR CERVICAL DECOMP/DISCECTOMY FUSION N/A 07/10/2016   Procedure: ANTERIOR CERVICAL DECOMPRESSION FUSION CERVICAL 4-5, CERVICAL 5-6, CERVICAL 6-7 WITH INSTRUMENTATION AND ALLOGRAFT;  Surgeon: [name], MD;  Location: [place];  Service: Orthopedics;  Laterality: N/A;  . CARDIAC CATHETERIZATION N/A 03/11/2016   Procedure: Left Heart Cath and Coronary Angiography;  Surgeon: [name], MD;  Location: [place] CV LAB;  Service: Cardiovascular;  Laterality: N/A;  . Thumb surgery Right      Current Outpatient Medications  Medication Sig Dispense Refill  . amitriptyline (ELAVIL) 25 MG tablet Take 1 tablet (25 mg total) by mouth at bedtime as needed for sleep. 30 tablet 3  . apixaban (ELIQUIS) 5 MG TABS tablet Take 1 tablet (5 mg total) by mouth 2 (two) times daily. 60 tablet 0  . carvedilol (COREG) 6.25 MG tablet Take 1.5 tablets (9.375 mg total) by mouth 2 (two) times daily with a meal. 90 tablet 3  . cholecalciferol (VITAMIN D) 1000 units tablet Take 1,000 Units by mouth daily.    . furosemide (LASIX) 40 MG tablet Take 1 tablet (40 mg total) by mouth daily as needed for fluid (if weight gain more than 3 punds). 30 tablet 0  . loratadine (CLARITIN) 10 MG tablet Take  1 tablet (10 mg total) by mouth daily. 30 tablet 3  . metFORMIN (GLUCOPHAGE XR) 500 MG 24 hr tablet Take 1 tablet (500 mg total) by mouth daily with breakfast. 90 tablet 0  . sacubitril-valsartan (ENTRESTO) 49-51 MG Take 1 tablet by mouth 2 (two) times daily. 60 tablet 3  . sildenafil (VIAGRA) 25 MG tablet Take 1 tablet (25 mg total) by mouth daily as needed for erectile dysfunction. 10 tablet 6   No current facility-administered medications for this visit.     Allergies:   Peanut-containing drug products; Penicillins; and Decadron [dexamethasone]    Social History:  The patient  reports that he quit smoking about 15 years ago. He has a 12.00 pack-year smoking history. He has never used smokeless tobacco. He reports that he does not drink alcohol or use drugs.   Family History:  The patient's family history includes Diabetes in his mother and sister.    ROS:  Please see the history of present illness.   Otherwise, review of systems is positive for weight change.   All other systems are reviewed and negative.    PHYSICAL EXAM: VS:  BP (!) 138/92   Pulse 99   Ht 5\' 6"  (1.676 m)   Wt 166 lb (75.3 kg)   SpO2 97%   BMI 26.79 kg/m  , BMI Body mass index is 26.79 kg/m. GEN: Well nourished, well developed, in no acute distress  HEENT: normal  Neck: no JVD, carotid bruits, or masses Cardiac: RRR; no murmurs, rubs, or gallops,no edema  Respiratory:  clear to auscultation bilaterally, normal work of breathing GI: soft, nontender, nondistended, + BS MS: no deformity or atrophy  Skin: warm and dry Neuro:  Strength and sensation are intact Psych: euthymic mood, full affect  EKG:  EKG is not ordered today. Personal review of the ekg ordered 05/13/18 shows sinus rhythm, left atrial enlargement, prolonged QT C, rate 97  Recent Labs: 05/11/2018: ALT 22 05/13/2018: Magnesium 2.3 05/21/2018: B Natriuretic Peptide 149.2; BUN 18; Creatinine, Ser 1.20; Hemoglobin 14.6; Platelets 234; Potassium 4.2; Sodium 136    Lipid Panel     Component Value Date/Time   CHOL 155 10/04/2014 1508   TRIG 87 10/04/2014 1508   HDL 54 10/04/2014 1508   CHOLHDL 2.9 10/04/2014 1508   VLDL 17 10/04/2014 1508   LDLCALC 84 10/04/2014 1508     Wt Readings from Last 3 Encounters:  05/27/18 166 lb (75.3 kg)  05/21/18 164 lb (74.4 kg)  05/14/18 159 lb 1.6 oz (72.2 kg)      Other studies Reviewed: Additional studies/ records that were reviewed today include: TTE 05/11/18  Review of the above records today demonstrates:  - Left ventricle: The cavity size was  mildly dilated. Systolic   function was severely reduced. The estimated ejection fraction   was in the range of 25% to 30%. Images were inadequate for LV   wall motion assessment even with definity contrast There was an   increased relative contribution of atrial contraction to   ventricular filling. Doppler parameters are consistent with   abnormal left ventricular relaxation (grade 1 diastolic   dysfunction). - Aortic valve: Probably trileaflet; normal thickness, mildly   calcified leaflets. - Mitral valve: There was mild to moderate regurgitation. - Pulmonary arteries: Systolic pressure could not be accurately   estimated.  CMRI 04/01/18 1. Mild LVE with mild LVH septal thickness 13 mm. Diffuse hypokinesis worse in the septum apex and lateral walls EF 28%  2. 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  Chronic systolic heart failure due to nonischemic cardiomyopathy: Currently on optimal medical therapy with Entresto and carvedilol.  Cardiac MRI consistent with amyloidosis.  He is starting tafamadis.  I discussed the possibility of ICD implant.  Risks and benefits were discussed include bleeding, tamponade, infection, pneumothorax.  The patient understands risks and is agreed to the procedure.  Of note he recently had a large bilateral pulmonary embolism.  Shalena Ezzell wait for 1 month as he is currently on Eliquis.  2.  Hypertension: Elevated today.  He has not tolerated higher doses of his heart failure medications.  Plan per CHF team.  3.  Pulmonary embolism: Found on recent CT PA.  Currently on Eliquis.    Current medicines are reviewed at length with the patient today.   The patient does not have concerns regarding his medicines.  The following changes were  made today:  none  Labs/ tests ordered today include:  No orders of the defined types were placed in this encounter.  Case discussed with primary cardiology  Disposition:   FU with Jazminn Pomales 3 months  Signed, Ammar Moffatt Meredith Leeds, MD  05/27/2018 10:21 AM     [facility] [address] [place] [address] [phone] (office) [phone] (fax)

## 2018-06-15 ENCOUNTER — Other Ambulatory Visit (HOSPITAL_COMMUNITY): Payer: Self-pay | Admitting: *Deleted

## 2018-06-15 MED ORDER — APIXABAN 5 MG PO TABS: 5.0000 mg | ORAL_TABLET | Freq: Two times a day (BID) | ORAL | 6 refills | Status: DC

## 2018-06-17 ENCOUNTER — Encounter (HOSPITAL_COMMUNITY): Payer: Self-pay | Admitting: *Deleted

## 2018-06-17 ENCOUNTER — Other Ambulatory Visit: Payer: Self-pay

## 2018-06-17 ENCOUNTER — Encounter (HOSPITAL_COMMUNITY)
Admission: RE | Disposition: A | Discharge status: Home / Self Care | Payer: Self-pay | Source: Ambulatory Visit | Attending: Cardiology

## 2018-06-17 ENCOUNTER — Ambulatory Visit (HOSPITAL_COMMUNITY)
Admission: RE | Admit: 2018-06-17 | Discharge: 2018-06-18 | Disposition: A | Discharge status: Home / Self Care | Payer: Medicaid Other | Source: Ambulatory Visit | Attending: Cardiology | Admitting: Cardiology

## 2018-06-17 DIAGNOSIS — Z888 Allergy status to other drugs, medicaments and biological substances status: Secondary | ICD-10-CM | POA: Insufficient documentation

## 2018-06-17 DIAGNOSIS — Z7901 Long term (current) use of anticoagulants: Secondary | ICD-10-CM | POA: Insufficient documentation

## 2018-06-17 DIAGNOSIS — Z006 Encounter for examination for normal comparison and control in clinical research program: Secondary | ICD-10-CM | POA: Insufficient documentation

## 2018-06-17 DIAGNOSIS — Z79899 Other long term (current) drug therapy: Secondary | ICD-10-CM | POA: Insufficient documentation

## 2018-06-17 DIAGNOSIS — Z91010 Allergy to peanuts: Secondary | ICD-10-CM | POA: Diagnosis not present

## 2018-06-17 DIAGNOSIS — I110 Hypertensive heart disease with heart failure: Secondary | ICD-10-CM | POA: Insufficient documentation

## 2018-06-17 DIAGNOSIS — Z880 Allergy status to penicillin: Secondary | ICD-10-CM | POA: Insufficient documentation

## 2018-06-17 DIAGNOSIS — R918 Other nonspecific abnormal finding of lung field: Secondary | ICD-10-CM | POA: Diagnosis not present

## 2018-06-17 DIAGNOSIS — Z833 Family history of diabetes mellitus: Secondary | ICD-10-CM | POA: Diagnosis not present

## 2018-06-17 DIAGNOSIS — Z955 Presence of coronary angioplasty implant and graft: Secondary | ICD-10-CM | POA: Insufficient documentation

## 2018-06-17 DIAGNOSIS — I5042 Chronic combined systolic (congestive) and diastolic (congestive) heart failure: Secondary | ICD-10-CM | POA: Diagnosis not present

## 2018-06-17 DIAGNOSIS — R7303 Prediabetes: Secondary | ICD-10-CM | POA: Diagnosis not present

## 2018-06-17 DIAGNOSIS — Z981 Arthrodesis status: Secondary | ICD-10-CM | POA: Diagnosis not present

## 2018-06-17 DIAGNOSIS — I2699 Other pulmonary embolism without acute cor pulmonale: Secondary | ICD-10-CM | POA: Insufficient documentation

## 2018-06-17 DIAGNOSIS — Z9889 Other specified postprocedural states: Secondary | ICD-10-CM | POA: Insufficient documentation

## 2018-06-17 DIAGNOSIS — Z87891 Personal history of nicotine dependence: Secondary | ICD-10-CM | POA: Diagnosis not present

## 2018-06-17 DIAGNOSIS — I428 Other cardiomyopathies: Secondary | ICD-10-CM | POA: Insufficient documentation

## 2018-06-17 DIAGNOSIS — Z95818 Presence of other cardiac implants and grafts: Secondary | ICD-10-CM

## 2018-06-17 DIAGNOSIS — Z7984 Long term (current) use of oral hypoglycemic drugs: Secondary | ICD-10-CM | POA: Insufficient documentation

## 2018-06-17 DIAGNOSIS — I5022 Chronic systolic (congestive) heart failure: Secondary | ICD-10-CM | POA: Diagnosis present

## 2018-06-17 HISTORY — PX: ICD IMPLANT: EP1208

## 2018-06-17 LAB — GLUCOSE, CAPILLARY
Glucose-Capillary: 110 mg/dL — ABNORMAL HIGH (ref 70–99)
Glucose-Capillary: 127 mg/dL — ABNORMAL HIGH (ref 70–99)

## 2018-06-17 LAB — SURGICAL PCR SCREEN
MRSA, PCR: NEGATIVE
Staphylococcus aureus: POSITIVE — AB

## 2018-06-17 SURGERY — ICD IMPLANT

## 2018-06-17 MED ORDER — SODIUM CHLORIDE 0.9 % IV SOLN
80.0000 mg | INTRAVENOUS | Status: AC
  Administered 2018-06-17: 80 mg

## 2018-06-17 MED ORDER — SODIUM CHLORIDE 0.9 % IV SOLN
INTRAVENOUS | Status: AC
  Filled 2018-06-17: qty 2

## 2018-06-17 MED ORDER — AMITRIPTYLINE HCL 25 MG PO TABS: 25.0000 mg | ORAL_TABLET | Freq: Every evening | ORAL | Status: DC | PRN

## 2018-06-17 MED ORDER — VITAMIN D 1000 UNITS PO TABS
1000.0000 [IU] | ORAL_TABLET | Freq: Every day | ORAL | Status: DC
  Administered 2018-06-18: 1000 [IU] via ORAL
  Filled 2018-06-17: qty 1

## 2018-06-17 MED ORDER — MUPIROCIN 2 % EX OINT: 1.0000 "application " | TOPICAL_OINTMENT | Freq: Once | CUTANEOUS | Status: AC

## 2018-06-17 MED ORDER — MUPIROCIN 2 % EX OINT
TOPICAL_OINTMENT | CUTANEOUS | Status: AC
  Administered 2018-06-17: 1
  Filled 2018-06-17: qty 22

## 2018-06-17 MED ORDER — SACUBITRIL-VALSARTAN 49-51 MG PO TABS
1.0000 | ORAL_TABLET | Freq: Two times a day (BID) | ORAL | Status: DC
  Administered 2018-06-17 – 2018-06-18 (×2): 1 via ORAL
  Filled 2018-06-17 (×2): qty 1

## 2018-06-17 MED ORDER — MIDAZOLAM HCL 5 MG/5ML IJ SOLN
INTRAMUSCULAR | Status: DC | PRN
  Administered 2018-06-17: 2 mg via INTRAVENOUS
  Administered 2018-06-17: 1 mg via INTRAVENOUS

## 2018-06-17 MED ORDER — HEPARIN (PORCINE) IN NACL 1000-0.9 UT/500ML-% IV SOLN
INTRAVENOUS | Status: AC
  Filled 2018-06-17: qty 500

## 2018-06-17 MED ORDER — METFORMIN HCL ER 500 MG PO TB24
500.0000 mg | ORAL_TABLET | Freq: Every day | ORAL | Status: DC
  Administered 2018-06-18: 500 mg via ORAL
  Filled 2018-06-17 (×2): qty 1

## 2018-06-17 MED ORDER — VANCOMYCIN HCL IN DEXTROSE 1-5 GM/200ML-% IV SOLN
INTRAVENOUS | Status: AC
  Filled 2018-06-17: qty 200

## 2018-06-17 MED ORDER — MIDAZOLAM HCL 5 MG/5ML IJ SOLN
INTRAMUSCULAR | Status: AC
  Filled 2018-06-17: qty 5

## 2018-06-17 MED ORDER — SILDENAFIL CITRATE 25 MG PO TABS: 25.0000 mg | ORAL_TABLET | Freq: Every day | ORAL | Status: DC | PRN

## 2018-06-17 MED ORDER — FUROSEMIDE 40 MG PO TABS: 40.0000 mg | ORAL_TABLET | Freq: Every day | ORAL | Status: DC | PRN

## 2018-06-17 MED ORDER — CHLORHEXIDINE GLUCONATE CLOTH 2 % EX PADS
6.0000 | MEDICATED_PAD | Freq: Every day | CUTANEOUS | Status: DC
  Administered 2018-06-17: 20:00:00 6 via TOPICAL

## 2018-06-17 MED ORDER — FENTANYL CITRATE (PF) 100 MCG/2ML IJ SOLN
INTRAMUSCULAR | Status: AC
  Filled 2018-06-17: qty 2

## 2018-06-17 MED ORDER — CARVEDILOL 3.125 MG PO TABS
9.3750 mg | ORAL_TABLET | Freq: Two times a day (BID) | ORAL | Status: DC
  Administered 2018-06-17 – 2018-06-18 (×2): 9.375 mg via ORAL
  Filled 2018-06-17 (×2): qty 3

## 2018-06-17 MED ORDER — ONDANSETRON HCL 4 MG/2ML IJ SOLN: 4.0000 mg | Freq: Four times a day (QID) | INTRAMUSCULAR | Status: DC | PRN

## 2018-06-17 MED ORDER — SODIUM CHLORIDE 0.9 % IV SOLN
INTRAVENOUS | Status: DC
  Administered 2018-06-17: 11:00:00 via INTRAVENOUS

## 2018-06-17 MED ORDER — FENTANYL CITRATE (PF) 100 MCG/2ML IJ SOLN
INTRAMUSCULAR | Status: DC | PRN
  Administered 2018-06-17: 12.5 ug via INTRAVENOUS
  Administered 2018-06-17: 25 ug via INTRAVENOUS

## 2018-06-17 MED ORDER — VANCOMYCIN HCL IN DEXTROSE 1-5 GM/200ML-% IV SOLN
1000.0000 mg | INTRAVENOUS | Status: AC
  Administered 2018-06-17: 1000 mg via INTRAVENOUS
  Filled 2018-06-17: qty 200

## 2018-06-17 MED ORDER — ACETAMINOPHEN 325 MG PO TABS
325.0000 mg | ORAL_TABLET | ORAL | Status: DC | PRN
  Administered 2018-06-17 – 2018-06-18 (×3): 650 mg via ORAL
  Filled 2018-06-17 (×3): qty 2

## 2018-06-17 MED ORDER — MUPIROCIN 2 % EX OINT
1.0000 "application " | TOPICAL_OINTMENT | Freq: Two times a day (BID) | CUTANEOUS | Status: DC
  Administered 2018-06-17 – 2018-06-18 (×2): 1 via NASAL

## 2018-06-17 MED ORDER — LIDOCAINE HCL (PF) 1 % IJ SOLN
INTRAMUSCULAR | Status: DC | PRN
  Administered 2018-06-17: 50 mL

## 2018-06-17 MED ORDER — CHLORHEXIDINE GLUCONATE 4 % EX LIQD
60.0000 mL | Freq: Once | CUTANEOUS | Status: DC
  Filled 2018-06-17: qty 60

## 2018-06-17 MED ORDER — LIDOCAINE HCL (PF) 1 % IJ SOLN
INTRAMUSCULAR | Status: AC
  Filled 2018-06-17: qty 60

## 2018-06-17 MED ORDER — APIXABAN 5 MG PO TABS
5.0000 mg | ORAL_TABLET | Freq: Two times a day (BID) | ORAL | Status: DC
  Administered 2018-06-17 – 2018-06-18 (×3): 5 mg via ORAL
  Filled 2018-06-17 (×3): qty 1

## 2018-06-17 MED ORDER — VANCOMYCIN HCL IN DEXTROSE 1-5 GM/200ML-% IV SOLN
1000.0000 mg | Freq: Two times a day (BID) | INTRAVENOUS | Status: AC
  Administered 2018-06-18: 1000 mg via INTRAVENOUS
  Filled 2018-06-17: qty 200

## 2018-06-17 MED ORDER — HEPARIN (PORCINE) IN NACL 2-0.9 UNITS/ML
INTRAMUSCULAR | Status: AC | PRN
  Administered 2018-06-17: 500 mL

## 2018-06-17 SURGICAL SUPPLY — 6 items
CABLE SURGICAL S-101-97-12 (CABLE) ×3 IMPLANT
ICD ELLIPSE VR CD1411-36Q (ICD Generator) ×3 IMPLANT
LEAD DURATA 7122Q-58CM (Lead) ×3 IMPLANT
PAD PRO RADIOLUCENT 2001M-C (PAD) ×3 IMPLANT
SHEATH CLASSIC 7F (SHEATH) ×3 IMPLANT
TRAY PACEMAKER INSERTION (PACKS) ×3 IMPLANT

## 2018-06-17 NOTE — H&P (Signed)
Jonathan Dickson has presented today for surgery, with the diagnosis of CHF.  The various methods of treatment have been discussed with the patient and family. After consideration of risks, benefits and other options for treatment, the patient has consented to  Procedure(s): ICD implant as a surgical intervention .  Risks include but not limited to bleeding, tamponade, infection, pneumothorax, among others. The patient's history has been reviewed, patient examined, no change in status, stable for surgery.  I have reviewed the patient's chart and labs.  Questions were answered to the patient's satisfaction.    Takiyah Bohnsack Curt Bears, MD 06/17/2018 11:56 AM  ICD Criteria  Current LVEF:28%. Within 12 months prior to implant: Yes   Heart failure history: Yes, Class II  Cardiomyopathy history: Yes, Non-Ischemic Cardiomyopathy.  Atrial Fibrillation/Atrial Flutter: No.  Ventricular tachycardia history: No.  Cardiac arrest history: No.  History of syndromes with risk of sudden death: No.  Previous ICD: No.  Current ICD indication: Primary  PPM indication: No.   Class I or II Bradycardia indication present: No  Beta Blocker therapy for 3 or more months: Yes, prescribed.   Ace Inhibitor/ARB therapy for 3 or more months: Yes, prescribed.

## 2018-06-17 NOTE — Discharge Summary (Addendum)
ELECTROPHYSIOLOGY PROCEDURE DISCHARGE SUMMARY    Patient ID: Jonathan Dickson,  MRN: [id number], DOB/AGE: 02-25-1956 62 y.o.  Admit date: 06/17/2018 Discharge date: 06/18/2018  Primary Care Physician: Ladell Pier, MD Primary Cardiologist: [place] Electrophysiologist: [facility]  Primary Discharge Diagnosis:  Chronic systolic heart failure s/p ICD implant this admission  Secondary Discharge Diagnosis:  1.  Amyloid 2.  Diabetes 3.  HTN 4.  Prior PE 5.  CKD 6.  Depression  Allergies  Allergen Reactions  . Peanut-Containing Drug Products Swelling  . Penicillins Other (See Comments)    CONVULSIONS Has patient had a PCN reaction causing immediate rash, facial/tongue/throat swelling, SOB or lightheadedness with hypotension: No Has patient had a PCN reaction causing severe rash involving mucus membranes or skin necrosis: No Has patient had a PCN reaction that required hospitalization No Has patient had a PCN reaction occurring within the last 10 years: No If all of the above answers are "NO", then may proceed with Cephalosporin use.   . Decadron [Dexamethasone] Itching     Procedures This Admission:  1.  Implantation of a STJ chamber ICD on 06/17/18 by Dr Curt Bears.  See op note for full details. There were no immediate post procedure complications. 2.  CXR on 06/18/18 demonstrated no pneumothorax status post device implantation.   Brief HPI: Jonathan Dickson is a 62 y.o. male was referred to electrophysiology in the outpatient setting for consideration of ICD implantation.  Past medical history includes chronic systolic heart failure, amyloidosis.  The patient has persistent LV dysfunction despite guideline directed therapy.  Risks, benefits, and alternatives to ICD implantation were reviewed with the patient who wished to proceed.   Hospital Course:  The patient was admitted and underwent implantation of a STJ ICD with details as outlined above. He was monitored on telemetry  overnight which demonstrated SR.  Left chest was without hematoma or ecchymosis.  The device was interrogated and found to be functioning normally.  CXR was obtained and demonstrated no pneumothorax status post device implantation.  Wound care, arm mobility, and restrictions were reviewed with the patient.  The patient was examined and considered stable for discharge to home.   The patient's discharge medications include an ARB (Entresto) and beta blocker (Coreg).   Physical Exam: Vitals:   06/17/18 2100 06/18/18 0515 06/18/18 0516 06/18/18 0551  BP:  (!) 168/110  (!) 175/122  Pulse:    93  Resp: 20 (!) 21 19 (!) 23  Temp:    98 F (36.7 C)  TempSrc:    Oral  SpO2:    96%  Weight:    75 kg  Height:        GEN- The patient is well appearing, alert and oriented x 3 today.   HEENT: normocephalic, atraumatic; sclera clear, conjunctiva pink; hearing intact; oropharynx clear; neck supple  Lungs- Clear to ausculation bilaterally, normal work of breathing.  No wheezes, rales, rhonchi Heart- Regular rate and rhythm  GI- soft, non-tender, non-distended, bowel sounds present  Extremities- no clubbing, cyanosis, or edema  MS- no significant deformity or atrophy Skin- warm and dry, no rash or lesion, left chest without hematoma/ecchymosis Psych- euthymic mood, full affect Neuro- strength and sensation are intact   Labs:   Lab Results  Component Value Date   WBC 6.1 05/21/2018   HGB 14.6 05/21/2018   HCT 45.1 05/21/2018   MCV 87.2 05/21/2018   PLT 234 05/21/2018   No results for input(s): NA, K, CL, CO2, BUN,  CREATININE, CALCIUM, PROT, BILITOT, ALKPHOS, ALT, AST, GLUCOSE in the last 168 hours.  Invalid input(s): LABALBU  Discharge Medications:  Allergies as of 06/18/2018      Reactions   Peanut-containing Drug Products Swelling   Penicillins Other (See Comments)   CONVULSIONS Has patient had a PCN reaction causing immediate rash, facial/tongue/throat swelling, SOB or lightheadedness  with hypotension: No Has patient had a PCN reaction causing severe rash involving mucus membranes or skin necrosis: No Has patient had a PCN reaction that required hospitalization No Has patient had a PCN reaction occurring within the last 10 years: No If all of the above answers are "NO", then may proceed with Cephalosporin use.   Decadron [dexamethasone] Itching      Medication List    STOP taking these medications   loratadine 10 MG tablet Commonly known as:  CLARITIN     TAKE these medications   amitriptyline 25 MG tablet Commonly known as:  ELAVIL Take 1 tablet (25 mg total) by mouth at bedtime as needed for sleep.   apixaban 5 MG Tabs tablet Commonly known as:  ELIQUIS Take 1 tablet (5 mg total) by mouth 2 (two) times daily.   carvedilol 6.25 MG tablet Commonly known as:  COREG Take 1.5 tablets (9.375 mg total) by mouth 2 (two) times daily with a meal.   cholecalciferol 1000 units tablet Commonly known as:  VITAMIN D Take 1,000 Units by mouth daily.   furosemide 40 MG tablet Commonly known as:  LASIX Take 1 tablet (40 mg total) by mouth daily as needed for fluid (if weight gain more than 3 punds).   metFORMIN 500 MG 24 hr tablet Commonly known as:  GLUCOPHAGE-XR Take 1 tablet (500 mg total) by mouth daily with breakfast.   sacubitril-valsartan 49-51 MG Commonly known as:  ENTRESTO Take 1 tablet by mouth 2 (two) times daily.   sildenafil 25 MG tablet Commonly known as:  VIAGRA Take 1 tablet (25 mg total) by mouth daily as needed for erectile dysfunction.       Disposition:  Discharge Instructions    Diet - low sodium heart healthy   Complete by:  As directed    Increase activity slowly   Complete by:  As directed      Follow-up Information    [place] [facility] Follow up on 06/29/2018.   Specialty:  Cardiology Why:  at [facility] information: [address] [place]        [name], MD Follow up on 09/29/2018.   Specialty:  Cardiology Why:  at 10:45AM Contact information: [place] [address] [phone]        Jolaine Artist, MD Follow up on 08/07/2018.   Specialty:  Cardiology Why:  at [facility] information: [place] [address] [phone]           Duration of Discharge Encounter: Greater than 30 minutes including physician time.  Signed, Chanetta Marshall, NP 06/18/2018 6:52 AM   I have seen and examined this patient with Chanetta Marshall.  Agree with above, note added to reflect my findings.  On exam, RRR, no murmurs, lungs clear. [employer] Jude ICD implanted for primary prevention. CXR and interrogation without issue. Plan for discharge with follow up in clinic.    Asencion Loveday M. Arham Symmonds MD 06/18/2018 9:01 AM

## 2018-06-17 NOTE — Discharge Instructions (Signed)
° ° °  Supplemental Discharge Instructions for  Pacemaker/Defibrillator Patients  Activity No heavy lifting or vigorous activity with your left/right arm for 6 to 8 weeks.  Do not raise your left/right arm above your head for one week.  Gradually raise your affected arm as drawn below.           __      06/21/18                  06/22/18                     06/23/18                    06/24/18  NO DRIVING for   1 week  ; you may begin driving on   7/62/83  .  WOUND CARE - Keep the wound area clean and dry.  Do not get this area wet for one week. No showers for one week; you may shower on  06/24/18   . - The tape/steri-strips on your wound will fall off; do not pull them off.  No bandage is needed on the site.  DO  NOT apply any creams, oils, or ointments to the wound area. - If you notice any drainage or discharge from the wound, any swelling or bruising at the site, or you develop a fever > 101? F after you are discharged home, call the office at once.  Special Instructions - You are still able to use cellular telephones; use the ear opposite the side where you have your pacemaker/defibrillator.  Avoid carrying your cellular phone near your device. - When traveling through airports, show security personnel your identification card to avoid being screened in the metal detectors.  Ask the security personnel to use the hand wand. - Avoid arc welding equipment, MRI testing (magnetic resonance imaging), TENS units (transcutaneous nerve stimulators).  Call the office for questions about other devices. - Avoid electrical appliances that are in poor condition or are not properly grounded. - Microwave ovens are safe to be near or to operate.  Additional information for defibrillator patients should your device go off: - If your device goes off ONCE and you feel fine afterward, notify the device clinic nurses. - If your device goes off ONCE and you do not feel well afterward, call 911. - If your device  goes off TWICE, call 911. - If your device goes off THREE times in one day, call 911.  DO NOT DRIVE YOURSELF OR A FAMILY MEMBER WITH A DEFIBRILLATOR TO THE HOSPITAL--CALL 911.

## 2018-06-17 NOTE — Plan of Care (Signed)
  Problem: Pain Managment: Goal: General experience of comfort will improve Outcome: Progressing   

## 2018-06-18 ENCOUNTER — Ambulatory Visit (HOSPITAL_COMMUNITY): Payer: Medicaid Other

## 2018-06-18 ENCOUNTER — Encounter (HOSPITAL_COMMUNITY): Payer: Self-pay | Admitting: Cardiology

## 2018-06-18 DIAGNOSIS — I5042 Chronic combined systolic (congestive) and diastolic (congestive) heart failure: Secondary | ICD-10-CM | POA: Diagnosis not present

## 2018-06-18 DIAGNOSIS — I428 Other cardiomyopathies: Secondary | ICD-10-CM | POA: Diagnosis not present

## 2018-06-18 DIAGNOSIS — Z006 Encounter for examination for normal comparison and control in clinical research program: Secondary | ICD-10-CM | POA: Diagnosis not present

## 2018-06-18 DIAGNOSIS — I110 Hypertensive heart disease with heart failure: Secondary | ICD-10-CM | POA: Diagnosis not present

## 2018-06-18 LAB — GLUCOSE, CAPILLARY: GLUCOSE-CAPILLARY: 122 mg/dL — AB (ref 70–99)

## 2018-06-18 MED ORDER — HYDRALAZINE HCL 25 MG PO TABS
25.0000 mg | ORAL_TABLET | Freq: Three times a day (TID) | ORAL | Status: DC
  Administered 2018-06-18: 09:00:00 25 mg via ORAL
  Filled 2018-06-18: qty 1

## 2018-06-18 MED ORDER — HYDRALAZINE HCL 25 MG PO TABS: 25.0000 mg | ORAL_TABLET | Freq: Three times a day (TID) | ORAL | 1 refills | Status: DC

## 2018-06-18 MED ORDER — TRAMADOL HCL 50 MG PO TABS
50.0000 mg | ORAL_TABLET | Freq: Two times a day (BID) | ORAL | Status: DC | PRN
  Administered 2018-06-18: 06:00:00 50 mg via ORAL
  Filled 2018-06-18: qty 1

## 2018-06-21 NOTE — Progress Notes (Addendum)
ADVANCED HF CLINIC Referring Physician: Dr [name]  Primary Care: Dr Wynetta Emery at [facility]  Primary Cardiologist: Dr [name] EP: Dr Curt Bears HF: Dr Haroldine Laws  HPI: Jonathan Dickson is a 62 y.o. male with a HTN, previous polysubstance abuse - including heroin (quit 2004) and depression, heart failure, amyloidosis, and PE.    Had Myoview in 5/17 with EF 22%. Subsequently underwent cath 6/17. EF 40-45%. Normal coronaries   Admitted to [facility] with increased dyspnea. Diuresed with IV. CTA performed and showed bilateral PE. Had AKI so diuretics held.  Placed on eliquis for PE.    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  He returns today for regular follow up. Last visit coreg increased. Overall doing fine. Has to take breaks walking around the grocery store due to SOB. No SOB with ADLs. Denies orthopnea, PND, or edema. No CP or dizziness. Appetite and energy level are good. Has a dry cough that he thinks is related to allergies. No fever/chills. Had some swelling at ICD site, but has improved with icepack. No drainage. Not painful. Weights 162-169 lbs. Taking all medications, but had only been taking hydralazine once daily. Taking lasix about once every 2 weeks. Continues to work at [employer] Limits fluid and salt.   Cardiac Testing CTA 05/12/2018 --.large bilateral  PE   CPX 04/03/18 FVC 2.77 (83%)    FEV1 2.37 (91%)     FEV1/FVC 86 (109%)     MVV 76 (61%)  Resting HR: 83 Peak HR: 125  (80% age predicted max HR) BP rest: 144/84 BP peak: 168/72 Peak VO2: 23.1 (76% predicted peak VO2) VE/VCO2 slope: 41 OUES: 2.18 Peak RER: 0.93 VE/MVV: 88% O2pulse: 14  (100% predicted O2pulse)  PYP scan obtained which was positive (Visual grade 2. Quantitative 1.57).  cMRI 04/01/18 1. Mild LVE with mild LVH septal thickness 13 mm. Diffuse hypokinesis worse in the septum apex and lateral walls EF 28% 2. Abnormal post gadolinium inversion recovery sequences with failure to null and uptake  in the mid/subepicardial septum, inferior wall and lateral walls with some apical sparing Findings consistent with amyloidosis or infiltrative cardiomyopathy  Echo 8/12/19LVEF ~30% (continues to drop in setting of TTR amyloid). RV normal without evidence of acute strain. LE u/s without DVT.  ECHO 12/2017   Left ventricle: EF 30% to 35% (I felt closer to 40-45%). Mild LVH Diffuse hypokinesis. Doppler   parameters are consistent with abnormal left ventricular   relaxation (grade 1 diastolic dysfunction).  ECHO EF 08/2017  Left ventricle: EF 50% to 55%.   Review of systems complete and found to be negative unless listed in HPI.   Past Medical History:  Diagnosis Date  . Abnormal TSH 02/2016  . Anxiety   . Cervical disc disease   . Chronic combined systolic and diastolic CHF (congestive heart failure) ([place])    a. 01/2016 Echo: EF 25%, inf AK, diffuse sev HK, Gr1 DD, mild MR.  . Depression   . Hyperglycemia   . Hypertension   . Hypertensive heart disease   . Myocardial infarction [facility])    ??  maybe  . NICM (nonischemic cardiomyopathy) ([place])    a. 01/2016 Echo: EF 25%, inf AK, diffuse sev HK, Gr1 DD, mild MR; b. 01/2016 MV: EF 22%, no isch/infarct;  c. 02/2016 Cath: Nl cors, EF 35-45%.  . Pre-diabetes   . Seizures ([place])    with penicillin    Current Outpatient Medications  Medication Sig Dispense Refill  . apixaban (ELIQUIS) 5  MG TABS tablet Take 1 tablet (5 mg total) by mouth 2 (two) times daily. 60 tablet 6  . carvedilol (COREG) 6.25 MG tablet Take 1.5 tablets (9.375 mg total) by mouth 2 (two) times daily with a meal. 90 tablet 3  . furosemide (LASIX) 40 MG tablet Take 1 tablet (40 mg total) by mouth daily as needed for fluid (if weight gain more than 3 punds). 30 tablet 0  . hydrALAZINE (APRESOLINE) 25 MG tablet Take 25 mg by mouth at bedtime.    . sacubitril-valsartan (ENTRESTO) 49-51 MG Take 1 tablet by mouth 2 (two) times daily. 60 tablet 3  . amitriptyline (ELAVIL) 25 MG  tablet Take 1 tablet (25 mg total) by mouth at bedtime as needed for sleep. 30 tablet 3  . cholecalciferol (VITAMIN D) 1000 units tablet Take 1,000 Units by mouth daily.    . metFORMIN (GLUCOPHAGE XR) 500 MG 24 hr tablet Take 1 tablet (500 mg total) by mouth daily with breakfast. 90 tablet 0  . sildenafil (VIAGRA) 25 MG tablet Take 1 tablet (25 mg total) by mouth daily as needed for erectile dysfunction. 10 tablet 6   No current facility-administered medications for this encounter.     Allergies  Allergen Reactions  . Peanut-Containing Drug Products Swelling  . Penicillins Other (See Comments)    CONVULSIONS Has patient had a PCN reaction causing immediate rash, facial/tongue/throat swelling, SOB or lightheadedness with hypotension: No Has patient had a PCN reaction causing severe rash involving mucus membranes or skin necrosis: No Has patient had a PCN reaction that required hospitalization No Has patient had a PCN reaction occurring within the last 10 years: No If all of the above answers are "NO", then may proceed with Cephalosporin use.   . Decadron [Dexamethasone] Itching      Social History   Socioeconomic History  . Marital status: Legally Separated    Spouse name: Not on file  . Number of children: 3  . Years of education: Not on file  . Highest education level: Not on file  Occupational History  . Not on file  Social Needs  . Financial resource strain: Not on file  . Food insecurity:    Worry: Not on file    Inability: Not on file  . Transportation needs:    Medical: Not on file    Non-medical: Not on file  Tobacco Use  . Smoking status: Former Smoker    Packs/day: 1.50    Years: 8.00    Pack years: 12.00    Last attempt to quit: 03/15/2003    Years since quitting: 15.2  . Smokeless tobacco: Never Used  Substance and Sexual Activity  . Alcohol use: No    Comment: none since 2004  . Drug use: No    Comment: former  none since 2004  . Sexual activity: Not  Currently  Lifestyle  . Physical activity:    Days per week: Not on file    Minutes per session: Not on file  . Stress: Not on file  Relationships  . Social connections:    Talks on phone: Not on file    Gets together: Not on file    Attends religious service: Not on file    Active member of club or organization: Not on file    Attends meetings of clubs or organizations: Not on file    Relationship status: Not on file  . Intimate partner violence:    Fear of current or ex partner: Not  on file    Emotionally abused: Not on file    Physically abused: Not on file    Forced sexual activity: Not on file  Other Topics Concern  . Not on file  Social History Narrative  . Not on file      Family History  Problem Relation Age of Onset  . Diabetes Mother   . Diabetes Sister     Vitals:   06/22/18 0837  BP: (!) 144/98  Pulse: 90  SpO2: 98%  Weight: 76.7 kg (169 lb 3.2 oz)    Wt Readings from Last 3 Encounters:  06/22/18 76.7 kg (169 lb 3.2 oz)  06/18/18 75 kg (165 lb 5.5 oz)  05/27/18 75.3 kg (166 lb)    PHYSICAL EXAM: General: Well appearing. No resp difficulty. Walked into clinic without difficulty. HEENT: Normal Neck: Supple. JVP flat. Carotids 2+ bilat; no bruits. No thyromegaly or nodule noted. Cor: PMI nondisplaced. RRR, No M/G/R noted. Left chest ICD site with steristrips. Small amount of swelling present. Nontender. Lungs: CTAB, normal effort. Abdomen: Soft, non-tender, non-distended, no HSM. No bruits or masses. +BS  Extremities: No cyanosis, clubbing, or rash. R and LLE no edema.  Neuro: Alert & orientedx3, cranial nerves grossly intact. moves all 4 extremities w/o difficulty. Affect pleasant   ASSESSMENT & PLAN: 1. Chronic Systolic Heart Failure, [facility]. Normal LHC 2017. 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 No M-spike, normal IFE  on myeloma panel. Tafamidis has been sent into specialty pharmacy 05/14/18. Pt has not heard anything. Will have our pharmacist check on this. - CPX 5/19 pVO2 23 but slope 41 - S/p St Jude ICD implant 06/17/18 - NYHA II-III - Volume status stable.  - Continue lasix 40 mg as needed for 3 pound weight gain.  - Continue carvedilol 9.375 mg twice a day.  - Continue Entresto 49-51 mg twice a day   - Start spiro 12.5 mg daily. BMET today and in 7-10 days. - Awaiting tafamidis as above. Checking on this today. - Discussed fluid and salt limitations and importance of daily weights.  2.DMII - Stable On metformin - Needs follow up with PCP. He plans to make appointment.  3. HTN - Elevated today. Has been taking hydralazine once daily instead of TID. Increase to TID and add spiro as above.  4. PE CTA 04/2018 with large bilateral PE - Continue Eliquis 5 mg twice a day. Denies bleeding.  5. AKI - Recent hsoptalization creatinine peaked at 1.7. - Creatinine 1.2 on 8/22.  6. Swelling at ICD pocket - ?small hematoma - No drainage, fever, or chills. No s/s infection. Non tender - Check CBC. Discussed with Vick Frees, EP NP. As long as decreasing in size, continue to monitor. Continue to use icepacks wrapped in towels (keep site dry). Keep wound check on 9/30. He knows to call device clinic if it gets worse.   Our pharmacist is calling specialty pharmacy to check on tafamidis (was sent in 8/15). She will give him a call to update him.  CBC, BMET today Start spiro 12.5 mg daily BMET 7-10 days Keep follow up with Dr Haroldine Laws 11/8  Georgiana Shore, NP  8:40 AM   Greater than 50% of the 25 minute visit was spent in counseling/coordination of care regarding disease state education, salt/fluid restriction, sliding scale diuretics, and medication compliance.

## 2018-06-22 ENCOUNTER — Ambulatory Visit (HOSPITAL_COMMUNITY)
Admission: RE | Admit: 2018-06-22 | Discharge: 2018-06-22 | Disposition: A | Discharge status: Home / Self Care | Payer: Medicaid Other | Source: Ambulatory Visit | Attending: Internal Medicine | Admitting: Internal Medicine

## 2018-06-22 ENCOUNTER — Encounter (HOSPITAL_COMMUNITY): Payer: Self-pay

## 2018-06-22 VITALS — BP 144/98 | HR 90 | Wt 169.2 lb

## 2018-06-22 DIAGNOSIS — I2699 Other pulmonary embolism without acute cor pulmonale: Secondary | ICD-10-CM | POA: Diagnosis not present

## 2018-06-22 DIAGNOSIS — I429 Cardiomyopathy, unspecified: Secondary | ICD-10-CM | POA: Diagnosis not present

## 2018-06-22 DIAGNOSIS — Z888 Allergy status to other drugs, medicaments and biological substances status: Secondary | ICD-10-CM | POA: Insufficient documentation

## 2018-06-22 DIAGNOSIS — I5022 Chronic systolic (congestive) heart failure: Secondary | ICD-10-CM

## 2018-06-22 DIAGNOSIS — Z79899 Other long term (current) drug therapy: Secondary | ICD-10-CM | POA: Insufficient documentation

## 2018-06-22 DIAGNOSIS — I10 Essential (primary) hypertension: Secondary | ICD-10-CM | POA: Diagnosis not present

## 2018-06-22 DIAGNOSIS — I5042 Chronic combined systolic (congestive) and diastolic (congestive) heart failure: Secondary | ICD-10-CM

## 2018-06-22 DIAGNOSIS — Z833 Family history of diabetes mellitus: Secondary | ICD-10-CM | POA: Insufficient documentation

## 2018-06-22 DIAGNOSIS — T82837A Hemorrhage of cardiac prosthetic devices, implants and grafts, initial encounter: Secondary | ICD-10-CM

## 2018-06-22 DIAGNOSIS — E119 Type 2 diabetes mellitus without complications: Secondary | ICD-10-CM | POA: Diagnosis not present

## 2018-06-22 DIAGNOSIS — R222 Localized swelling, mass and lump, trunk: Secondary | ICD-10-CM | POA: Insufficient documentation

## 2018-06-22 DIAGNOSIS — Z880 Allergy status to penicillin: Secondary | ICD-10-CM | POA: Insufficient documentation

## 2018-06-22 DIAGNOSIS — I252 Old myocardial infarction: Secondary | ICD-10-CM | POA: Insufficient documentation

## 2018-06-22 DIAGNOSIS — Z7901 Long term (current) use of anticoagulants: Secondary | ICD-10-CM | POA: Insufficient documentation

## 2018-06-22 DIAGNOSIS — I110 Hypertensive heart disease with heart failure: Secondary | ICD-10-CM | POA: Insufficient documentation

## 2018-06-22 DIAGNOSIS — Z86711 Personal history of pulmonary embolism: Secondary | ICD-10-CM | POA: Insufficient documentation

## 2018-06-22 DIAGNOSIS — Z7984 Long term (current) use of oral hypoglycemic drugs: Secondary | ICD-10-CM | POA: Insufficient documentation

## 2018-06-22 DIAGNOSIS — I43 Cardiomyopathy in diseases classified elsewhere: Secondary | ICD-10-CM

## 2018-06-22 DIAGNOSIS — Z87891 Personal history of nicotine dependence: Secondary | ICD-10-CM | POA: Diagnosis not present

## 2018-06-22 DIAGNOSIS — Z8659 Personal history of other mental and behavioral disorders: Secondary | ICD-10-CM | POA: Diagnosis not present

## 2018-06-22 DIAGNOSIS — R05 Cough: Secondary | ICD-10-CM | POA: Insufficient documentation

## 2018-06-22 DIAGNOSIS — E854 Organ-limited amyloidosis: Secondary | ICD-10-CM

## 2018-06-22 LAB — CBC
HEMATOCRIT: 47 % (ref 39.0–52.0)
Hemoglobin: 15 g/dL (ref 13.0–17.0)
MCH: 27.8 pg (ref 26.0–34.0)
MCHC: 31.9 g/dL (ref 30.0–36.0)
MCV: 87.2 fL (ref 78.0–100.0)
Platelets: 176 10*3/uL (ref 150–400)
RBC: 5.39 MIL/uL (ref 4.22–5.81)
RDW: 13.6 % (ref 11.5–15.5)
WBC: 5.8 10*3/uL (ref 4.0–10.5)

## 2018-06-22 LAB — BASIC METABOLIC PANEL
ANION GAP: 8 (ref 5–15)
BUN: 13 mg/dL (ref 8–23)
CALCIUM: 9.4 mg/dL (ref 8.9–10.3)
CO2: 23 mmol/L (ref 22–32)
Chloride: 105 mmol/L (ref 98–111)
Creatinine, Ser: 1.16 mg/dL (ref 0.61–1.24)
GFR calc Af Amer: >60 mL/min (ref >60)
GFR calc non Af Amer: >60 mL/min (ref >60)
GLUCOSE: 118 mg/dL — AB (ref 70–99)
Potassium: 4.9 mmol/L (ref 3.5–5.1)
Sodium: 136 mmol/L (ref 135–145)

## 2018-06-22 MED ORDER — HYDRALAZINE HCL 25 MG PO TABS: 25.0000 mg | ORAL_TABLET | Freq: Three times a day (TID) | ORAL | 11 refills | Status: DC

## 2018-06-22 MED ORDER — SPIRONOLACTONE 25 MG PO TABS: 12.5000 mg | ORAL_TABLET | Freq: Every day | ORAL | 11 refills | Status: DC

## 2018-06-22 NOTE — Patient Instructions (Signed)
Jonathan Dickson (CH[facility] Clinical Pharamacist) will be in contact regarding your new medication Tafamidis.  Routine lab work today. Will notify you of abnormal results, otherwise no news is good news!  START Spironolactone 12.5 mg (1/2 tablet) once daily.  INCREASE Hydralazine to 25 mg tablet THREE times daily. (1 tablet once every 8 hours).  Return next week for repeat labs.  ______________________________________________________________ Jonathan Dickson Code:   Follow up 6 weeks with Dr. Haroldine Dickson.  Take all medication as prescribed the day of your appointment. Bring all medications with you to your appointment.  Do the following things EVERYDAY: 1) Weigh yourself in the morning before breakfast. Write it down and keep it in a log. 2) Take your medicines as prescribed 3) Eat low salt foods-Limit salt (sodium) to 2000 mg per day.  4) Stay as active as you can everyday 5) Limit all fluids for the day to less than 2 liters

## 2018-06-22 NOTE — Addendum Note (Signed)
Encounter addended by: Georgiana Shore, NP on: 06/22/2018 9:27 AM  Actions taken: Sign clinical note

## 2018-06-24 MED FILL — $VIAGRA 25 MG TABLET: 25 MG | 30 days supply | Qty: 10 | Fill #5

## 2018-06-29 ENCOUNTER — Ambulatory Visit (INDEPENDENT_AMBULATORY_CARE_PROVIDER_SITE_OTHER): Payer: Medicaid Other | Admitting: *Deleted

## 2018-06-29 ENCOUNTER — Telehealth: Payer: Self-pay | Admitting: Internal Medicine

## 2018-06-29 ENCOUNTER — Ambulatory Visit (HOSPITAL_COMMUNITY)
Admission: RE | Admit: 2018-06-29 | Discharge: 2018-06-29 | Disposition: A | Discharge status: Home / Self Care | Payer: Medicaid Other | Source: Ambulatory Visit | Attending: Internal Medicine | Admitting: Internal Medicine

## 2018-06-29 DIAGNOSIS — I5022 Chronic systolic (congestive) heart failure: Secondary | ICD-10-CM | POA: Insufficient documentation

## 2018-06-29 DIAGNOSIS — I428 Other cardiomyopathies: Secondary | ICD-10-CM | POA: Diagnosis not present

## 2018-06-29 DIAGNOSIS — E1165 Type 2 diabetes mellitus with hyperglycemia: Secondary | ICD-10-CM

## 2018-06-29 LAB — CUP PACEART INCLINIC DEVICE CHECK
Date Time Interrogation Session: 20190930103417
HighPow Impedance: 65.25 Ohm
Implantable Lead Implant Date: 20190918
Implantable Lead Location: 753860
Implantable Lead Model: 7122
Implantable Pulse Generator Implant Date: 20190918
Lead Channel Pacing Threshold Amplitude: 0.5 V
Lead Channel Pacing Threshold Pulse Width: 0.5 ms
Lead Channel Pacing Threshold Pulse Width: 0.5 ms
MDC IDC MSMT BATTERY REMAINING LONGEVITY: 90 mo
MDC IDC MSMT LEADCHNL RV IMPEDANCE VALUE: 425 Ohm
MDC IDC MSMT LEADCHNL RV PACING THRESHOLD AMPLITUDE: 0.5 V
MDC IDC MSMT LEADCHNL RV SENSING INTR AMPL: 9.5 mV
MDC IDC PG SERIAL: 9796641
MDC IDC SET LEADCHNL RV PACING AMPLITUDE: 3.5 V
MDC IDC SET LEADCHNL RV PACING PULSEWIDTH: 0.5 ms
MDC IDC SET LEADCHNL RV SENSING SENSITIVITY: 0.5 mV
MDC IDC STAT BRADY RV PERCENT PACED: 0 %

## 2018-06-29 LAB — BASIC METABOLIC PANEL
Anion gap: 11 (ref 5–15)
BUN: 12 mg/dL (ref 8–23)
CALCIUM: 9.2 mg/dL (ref 8.9–10.3)
CO2: 20 mmol/L — AB (ref 22–32)
CREATININE: 1.26 mg/dL — AB (ref 0.61–1.24)
Chloride: 104 mmol/L (ref 98–111)
GFR calc Af Amer: >60 mL/min (ref >60)
GFR, EST NON AFRICAN AMERICAN: 59 mL/min — AB (ref >60)
GLUCOSE: 99 mg/dL (ref 70–99)
Potassium: 5 mmol/L (ref 3.5–5.1)
Sodium: 135 mmol/L (ref 135–145)

## 2018-06-29 MED ORDER — METFORMIN HCL ER 500 MG PO TB24: 500.0000 mg | ORAL_TABLET | Freq: Every day | ORAL | 0 refills | Status: DC

## 2018-06-29 NOTE — Telephone Encounter (Signed)
Pt called to request a medication refill on -metFORMIN (GLUCOPHAGE XR) 500 MG 24 hr tablet  To -[id number] - [place], [place] - [address]. Please follow up

## 2018-06-29 NOTE — Progress Notes (Signed)
Wound check appointment. Steri-strips removed. Wound without redness or edema. Incision edges approximated, wound well healed. Normal device function. Thresholds sensing, and impedances consistent with implant measurements. Device programmed at 3.5V for extra safety margin until 3 month visit. Histogram distribution appropriate for patient and level of activity. No ventricular arrhythmias noted. Patient educated about wound care, arm mobility, lifting restrictions, shock plan. ROV 09/29/2018 w/ WC.

## 2018-07-03 ENCOUNTER — Other Ambulatory Visit: Payer: Self-pay | Admitting: Internal Medicine

## 2018-07-15 ENCOUNTER — Other Ambulatory Visit (HOSPITAL_COMMUNITY): Payer: Medicaid Other

## 2018-07-16 ENCOUNTER — Ambulatory Visit (HOSPITAL_COMMUNITY)
Admission: RE | Admit: 2018-07-16 | Discharge: 2018-07-16 | Disposition: A | Discharge status: Home / Self Care | Payer: Medicaid Other | Source: Ambulatory Visit | Attending: Cardiology | Admitting: Cardiology

## 2018-07-16 DIAGNOSIS — I420 Dilated cardiomyopathy: Secondary | ICD-10-CM | POA: Diagnosis not present

## 2018-07-16 LAB — BASIC METABOLIC PANEL
Anion gap: 9 (ref 5–15)
BUN: 11 mg/dL (ref 8–23)
CHLORIDE: 107 mmol/L (ref 98–111)
CO2: 22 mmol/L (ref 22–32)
CREATININE: 1.19 mg/dL (ref 0.61–1.24)
Calcium: 9.4 mg/dL (ref 8.9–10.3)
GFR calc non Af Amer: >60 mL/min (ref >60)
Glucose, Bld: 113 mg/dL — ABNORMAL HIGH (ref 70–99)
Potassium: 4.5 mmol/L (ref 3.5–5.1)
Sodium: 138 mmol/L (ref 135–145)

## 2018-07-21 ENCOUNTER — Encounter: Payer: Self-pay | Admitting: Internal Medicine

## 2018-07-21 ENCOUNTER — Ambulatory Visit: Payer: Medicaid Other | Attending: Internal Medicine | Admitting: Internal Medicine

## 2018-07-21 VITALS — BP 132/86 | HR 107 | Temp 97.5°F | Resp 16 | Wt 174.0 lb

## 2018-07-21 DIAGNOSIS — I43 Cardiomyopathy in diseases classified elsewhere: Secondary | ICD-10-CM

## 2018-07-21 DIAGNOSIS — Z95810 Presence of automatic (implantable) cardiac defibrillator: Secondary | ICD-10-CM | POA: Diagnosis not present

## 2018-07-21 DIAGNOSIS — Z91010 Allergy to peanuts: Secondary | ICD-10-CM | POA: Insufficient documentation

## 2018-07-21 DIAGNOSIS — G4700 Insomnia, unspecified: Secondary | ICD-10-CM | POA: Diagnosis not present

## 2018-07-21 DIAGNOSIS — I110 Hypertensive heart disease with heart failure: Secondary | ICD-10-CM | POA: Diagnosis not present

## 2018-07-21 DIAGNOSIS — I2699 Other pulmonary embolism without acute cor pulmonale: Secondary | ICD-10-CM

## 2018-07-21 DIAGNOSIS — Z79899 Other long term (current) drug therapy: Secondary | ICD-10-CM | POA: Insufficient documentation

## 2018-07-21 DIAGNOSIS — Z87891 Personal history of nicotine dependence: Secondary | ICD-10-CM | POA: Insufficient documentation

## 2018-07-21 DIAGNOSIS — Z888 Allergy status to other drugs, medicaments and biological substances status: Secondary | ICD-10-CM | POA: Insufficient documentation

## 2018-07-21 DIAGNOSIS — E854 Organ-limited amyloidosis: Secondary | ICD-10-CM | POA: Insufficient documentation

## 2018-07-21 DIAGNOSIS — R7303 Prediabetes: Secondary | ICD-10-CM | POA: Diagnosis not present

## 2018-07-21 DIAGNOSIS — Z7984 Long term (current) use of oral hypoglycemic drugs: Secondary | ICD-10-CM | POA: Diagnosis not present

## 2018-07-21 DIAGNOSIS — I10 Essential (primary) hypertension: Secondary | ICD-10-CM

## 2018-07-21 DIAGNOSIS — Z880 Allergy status to penicillin: Secondary | ICD-10-CM | POA: Diagnosis not present

## 2018-07-21 DIAGNOSIS — I428 Other cardiomyopathies: Secondary | ICD-10-CM | POA: Diagnosis not present

## 2018-07-21 DIAGNOSIS — H9193 Unspecified hearing loss, bilateral: Secondary | ICD-10-CM | POA: Insufficient documentation

## 2018-07-21 DIAGNOSIS — I5022 Chronic systolic (congestive) heart failure: Secondary | ICD-10-CM | POA: Diagnosis present

## 2018-07-21 DIAGNOSIS — Z833 Family history of diabetes mellitus: Secondary | ICD-10-CM | POA: Insufficient documentation

## 2018-07-21 DIAGNOSIS — Z981 Arthrodesis status: Secondary | ICD-10-CM | POA: Insufficient documentation

## 2018-07-21 DIAGNOSIS — Z86711 Personal history of pulmonary embolism: Secondary | ICD-10-CM | POA: Insufficient documentation

## 2018-07-21 DIAGNOSIS — Z7901 Long term (current) use of anticoagulants: Secondary | ICD-10-CM | POA: Insufficient documentation

## 2018-07-21 LAB — GLUCOSE, POCT (MANUAL RESULT ENTRY): POC GLUCOSE: 108 mg/dL — AB (ref 70–99)

## 2018-07-21 MED ORDER — CARVEDILOL 12.5 MG PO TABS: 12.5000 mg | ORAL_TABLET | Freq: Two times a day (BID) | ORAL | 6 refills | Status: DC

## 2018-07-21 MED ORDER — AMITRIPTYLINE HCL 25 MG PO TABS: 25.0000 mg | ORAL_TABLET | Freq: Every evening | ORAL | 3 refills | Status: DC | PRN

## 2018-07-21 NOTE — Progress Notes (Signed)
Patient ID: BLAS RICHES, male    DOB: 10-Sep-1956  MRN: [id number]  CC: Hospitalization Follow-up   Subjective: Jonathan Dickson is a 62 y.o. male who presents for chronic ds management and hosp f/u His concerns today include:  Patient with history of Pt with hx of NICM, chronic systolic CHF with recent EF of 25-30%, ICD,, HTN, preDM, BL PE, insomnia  Since I last saw him 10/2017, patient was hospitalized [facility] 8[facility]11[facility]2019 with acute hypoxic respiratory failure secondary to decompensated systolic CHF and bilateral PE.  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  No M spike.  Hospitalized again 9/18-19/2019 for ICD placement. -He was seen heart failure clinic since hospitalization.  Pt started on Tafamadis per cardiology NP note.   Since hospitalization patient reports that he is doing well.  Some SOB if he gets upset.  No CP.  No LE edema.  No PND/orthopnea. -He is a little upset today because he thinks he left his cell phone on the city bus and is anxious to try to relocate it.  PE:  No bruising or bleeding since being on Eliquis.  No prior history of blood clots.  Will refer to hematology on future visit for opinion on duration of Eliquis.  Since it was unprovoked, I am thinking long-term or minimum of 6 months to a year.  Pre-DM: tolerating Metformin Doing well with eating habits.   Patient requests refill on Elavil. Patient Active Problem List   Diagnosis Date Noted  . Chronic systolic heart failure ([place]) 06/17/2018  . Bilateral pulmonary embolism ([place]) 05/12/2018  . Heart failure ([place]) 05/11/2018  . Elevated troponin 05/11/2018  . Acute respiratory failure with hypoxia ([place]) 05/11/2018  . AKI (acute kidney injury) ([place]) 05/11/2018  . Claudication in peripheral vascular disease ([place]) 12/31/2017  . Decreased hearing of both ears 11/10/2017  . Cervical disc disease with myelopathy 07/10/2016  . Chronic combined systolic and diastolic CHF  (congestive heart failure) ([place])   . Acute on chronic combined systolic and diastolic congestive heart failure ([place]) 03/12/2016  . NICM (nonischemic cardiomyopathy) ([place]) 03/12/2016  . Abnormal TSH 03/12/2016  . Hyperglycemia 03/12/2016  . Chest pain 03/12/2016  . Hypertensive heart disease 03/12/2016  . Congestive dilated cardiomyopathy ([place]) 03/11/2016  . Left ventricular dysfunction 03/06/2016  . Cervical radiculitis 04/19/2015  . Essential hypertension 09/27/2014     Current Outpatient Medications on File Prior to Visit  Medication Sig Dispense Refill  . Tafamidis Meglumine, Cardiac, (VYNDAQEL) 20 MG CAPS Take 20 mg by mouth.    Marland Kitchen apixaban (ELIQUIS) 5 MG TABS tablet Take 1 tablet (5 mg total) by mouth 2 (two) times daily. 60 tablet 6  . cholecalciferol (VITAMIN D) 1000 units tablet Take 1,000 Units by mouth daily.    . furosemide (LASIX) 40 MG tablet Take 1 tablet (40 mg total) by mouth daily as needed for fluid (if weight gain more than 3 punds). 30 tablet 0  . hydrALAZINE (APRESOLINE) 25 MG tablet Take 1 tablet (25 mg total) by mouth 3 (three) times daily. 90 tablet 11  . metFORMIN (GLUCOPHAGE XR) 500 MG 24 hr tablet Take 1 tablet (500 mg total) by mouth daily with breakfast. 30 tablet 0  . sacubitril-valsartan (ENTRESTO) 49-51 MG Take 1 tablet by mouth 2 (two) times daily. 60 tablet 3  . sildenafil (VIAGRA) 25 MG tablet Take 1 tablet (25 mg total) by mouth daily as needed for erectile dysfunction. 10 tablet 6  .  spironolactone (ALDACTONE) 25 MG tablet Take 0.5 tablets (12.5 mg total) by mouth daily. 15 tablet 11   No current facility-administered medications on file prior to visit.     Allergies  Allergen Reactions  . Peanut-Containing Drug Products Swelling  . Penicillins Other (See Comments)    CONVULSIONS Has patient had a PCN reaction causing immediate rash, facial/tongue/throat swelling, SOB or lightheadedness with hypotension: No Has patient had a PCN reaction causing  severe rash involving mucus membranes or skin necrosis: No Has patient had a PCN reaction that required hospitalization No Has patient had a PCN reaction occurring within the last 10 years: No If all of the above answers are "NO", then may proceed with Cephalosporin use.   . Decadron [Dexamethasone] Itching    Social History   Socioeconomic History  . Marital status: Legally Separated    Spouse name: Not on file  . Number of children: 3  . Years of education: Not on file  . Highest education level: Not on file  Occupational History  . Not on file  Social Needs  . Financial resource strain: Not on file  . Food insecurity:    Worry: Not on file    Inability: Not on file  . Transportation needs:    Medical: Not on file    Non-medical: Not on file  Tobacco Use  . Smoking status: Former Smoker    Packs/day: 1.50    Years: 8.00    Pack years: 12.00    Last attempt to quit: 03/15/2003    Years since quitting: 15.3  . Smokeless tobacco: Never Used  Substance and Sexual Activity  . Alcohol use: No    Comment: none since 2004  . Drug use: No    Comment: former  none since 2004  . Sexual activity: Not Currently  Lifestyle  . Physical activity:    Days per week: Not on file    Minutes per session: Not on file  . Stress: Not on file  Relationships  . Social connections:    Talks on phone: Not on file    Gets together: Not on file    Attends religious service: Not on file    Active member of club or organization: Not on file    Attends meetings of clubs or organizations: Not on file    Relationship status: Not on file  . Intimate partner violence:    Fear of current or ex partner: Not on file    Emotionally abused: Not on file    Physically abused: Not on file    Forced sexual activity: Not on file  Other Topics Concern  . Not on file  Social History Narrative  . Not on file    Family History  Problem Relation Age of Onset  . Diabetes Mother   . Diabetes Sister      Past Surgical History:  Procedure Laterality Date  . ANTERIOR CERVICAL DECOMP/DISCECTOMY FUSION N/A 07/10/2016   Procedure: ANTERIOR CERVICAL DECOMPRESSION FUSION CERVICAL 4-5, CERVICAL 5-6, CERVICAL 6-7 WITH INSTRUMENTATION AND ALLOGRAFT;  Surgeon: [name], MD;  Location: [facility];  Service: Orthopedics;  Laterality: N/A;  . CARDIAC CATHETERIZATION N/A 03/11/2016   Procedure: Left Heart Cath and Coronary Angiography;  Surgeon: [name], MD;  Location: [facility];  Service: Cardiovascular;  Laterality: N/A;  . ICD IMPLANT N/A 06/17/2018   Procedure: ICD IMPLANT;  Surgeon: [name], MD;  Location: [facility];  Service: Cardiovascular;  Laterality: N/A;  .  Thumb surgery Right     ROS: Review of Systems Negative except as stated above  PHYSICAL EXAM: BP 132/86   Pulse (!) 107   Temp (!) 97.5 F (36.4 C) (Oral)   Resp 16   Wt 174 lb (78.9 kg)   SpO2 98%   BMI 28.08 kg/m   Physical Exam  Repeat BP 128/90, pulse 101 General appearance - alert, well appearing, and in no distress Mental status - normal mood, behavior, speech, dress, motor activity, and thought processes Mouth - mucous membranes moist, pharynx normal without lesions Neck - supple, no significant adenopathy Chest - clear to auscultation, no wheezes, rales or rhonchi, symmetric air entry Heart - normal rate, regular rhythm, normal S1, S2, no murmurs, rubs, clicks or gallops Extremities - peripheral pulses normal, no pedal edema, no clubbing or cyanosis  Results for orders placed or performed in visit on 07/21/18  POCT glucose (manual entry)  Result Value Ref Range   POC Glucose 108 (A) 70 - 99 mg/dl   BS 108  ASSESSMENT AND PLAN: 1. Essential hypertension Close to goal.  Continue current medications including spironolactone, carvedilol, hydralazine - carvedilol (COREG) 12.5 MG tablet; Take 1 tablet (12.5 mg total) by mouth 2 (two) times daily with a meal.  Dispense: 60  tablet; Refill: 6  2. Prediabetes Doing well on metformin.  Encourage him to stay active. - POCT glucose (manual entry) - Microalbumin / creatinine urine ratio  3. NICM (nonischemic cardiomyopathy) ([place]) 4. Chronic systolic CHF (congestive heart failure) ([place]) Patient on guideline directed therapy.  On clinical exam today he is not fluid overloaded. - carvedilol (COREG) 12.5 MG tablet; Take 1 tablet (12.5 mg total) by mouth 2 (two) times daily with a meal.  Dispense: 60 tablet; Refill: 6  5. Cardiac amyloidosis ([place]) Followed by cardiology 6. Insomnia, unspecified type - amitriptyline (ELAVIL) 25 MG tablet; Take 1 tablet (25 mg total) by mouth at bedtime as needed for sleep.  Dispense: 30 tablet; Refill: 3  7.  Bilateral pulmonary embolism We will discuss referral to hematology for opinion on length of need of anticoagulation on next visit. Patient was given the opportunity to ask questions.  Patient verbalized understanding of the plan and was able to repeat key elements of the plan.   Orders Placed This Encounter  Procedures  . Ambulatory referral to Hematology  . POCT glucose (manual entry)     Requested Prescriptions   Signed Prescriptions Disp Refills  . carvedilol (COREG) 12.5 MG tablet 60 tablet 6    Sig: Take 1 tablet (12.5 mg total) by mouth 2 (two) times daily with a meal.  . amitriptyline (ELAVIL) 25 MG tablet 30 tablet 3    Sig: Take 1 tablet (25 mg total) by mouth at bedtime as needed for sleep.    Return in about 4 months (around 11/21/2018).  Karle Plumber, MD, FACP

## 2018-07-21 NOTE — Patient Instructions (Addendum)
Your blood pressure is not at goal of 130/80 or lower.  I recommend increasing carvedilol to 12.5 mg twice a day.  Continue to limit salt in the foods.  Do not forget to get your flu shot at your outside pharmacy.

## 2018-07-24 ENCOUNTER — Other Ambulatory Visit: Payer: Self-pay | Admitting: Internal Medicine

## 2018-07-24 DIAGNOSIS — E1165 Type 2 diabetes mellitus with hyperglycemia: Secondary | ICD-10-CM

## 2018-07-27 ENCOUNTER — Telehealth: Payer: Self-pay | Admitting: Hematology and Oncology

## 2018-07-27 ENCOUNTER — Encounter: Payer: Self-pay | Admitting: Hematology and Oncology

## 2018-07-27 NOTE — Telephone Encounter (Signed)
New referral received from Dr. Wynetta Emery at [facility] for bilateral pe. Pt has been cld and scheduled to see Dr. Audelia Hives on 11/8 at [facility]. Pt aware to arrive 30 minutes early. Letter mailed.

## 2018-07-28 ENCOUNTER — Telehealth: Payer: Self-pay | Admitting: Cardiology

## 2018-07-28 ENCOUNTER — Ambulatory Visit (INDEPENDENT_AMBULATORY_CARE_PROVIDER_SITE_OTHER): Payer: Medicaid Other | Admitting: *Deleted

## 2018-07-28 ENCOUNTER — Telehealth (HOSPITAL_COMMUNITY): Payer: Self-pay

## 2018-07-28 DIAGNOSIS — I5022 Chronic systolic (congestive) heart failure: Secondary | ICD-10-CM

## 2018-07-28 DIAGNOSIS — I420 Dilated cardiomyopathy: Secondary | ICD-10-CM

## 2018-07-28 NOTE — Progress Notes (Signed)
Patient presents to the office for a wound check d/t pain at the incision x 2 days. Site assessed. Incision edges approximated without redness. No edema or ecchymosis noted. Patient denied fever or chills. Dr.Camnitz assessed wound and recommended that patient call back if he develops any redness, swelling, or drainage in the area. Patient was also instructed to call back if the pain doesn't resolve over the next couple of weeks. Patient verbalized understanding of information. Patient to follow up as scheduled.

## 2018-07-28 NOTE — Telephone Encounter (Signed)
Please have him call the EP clinic.    Legrand Como [address] [place], PA-C 07/28/2018 9:27 AM

## 2018-07-28 NOTE — Telephone Encounter (Signed)
Pt.notified

## 2018-07-28 NOTE — Telephone Encounter (Signed)
Patient called and stated that he is experiencing pain where the incision is located. Pt states that the incision is not red. He stated that defib feels like it is trying to come out of the incision. After Therapist, art. Offered pt an appt for today and he agreed to a 12:30 PM appt.

## 2018-07-28 NOTE — Telephone Encounter (Signed)
Pt called stating that he had a defib put in a month ago and the incision sight is sore that it hurts to touch. Pt states he does not know if he slept on it. Please advise.

## 2018-08-07 ENCOUNTER — Encounter (HOSPITAL_COMMUNITY): Payer: Medicaid Other | Admitting: Internal Medicine

## 2018-08-07 ENCOUNTER — Encounter: Payer: Self-pay | Admitting: Hematology and Oncology

## 2018-08-07 ENCOUNTER — Telehealth: Payer: Self-pay | Admitting: Hematology and Oncology

## 2018-08-07 ENCOUNTER — Inpatient Hospital Stay: Payer: Medicaid Other | Attending: Hematology and Oncology | Admitting: Hematology and Oncology

## 2018-08-07 VITALS — BP 115/75 | HR 84 | Temp 97.7°F | Resp 17 | Ht 66.0 in | Wt 170.3 lb

## 2018-08-07 DIAGNOSIS — Z87891 Personal history of nicotine dependence: Secondary | ICD-10-CM | POA: Diagnosis not present

## 2018-08-07 DIAGNOSIS — Z79899 Other long term (current) drug therapy: Secondary | ICD-10-CM | POA: Diagnosis not present

## 2018-08-07 DIAGNOSIS — Z7984 Long term (current) use of oral hypoglycemic drugs: Secondary | ICD-10-CM | POA: Diagnosis not present

## 2018-08-07 DIAGNOSIS — I428 Other cardiomyopathies: Secondary | ICD-10-CM | POA: Diagnosis not present

## 2018-08-07 DIAGNOSIS — I2699 Other pulmonary embolism without acute cor pulmonale: Secondary | ICD-10-CM

## 2018-08-07 DIAGNOSIS — I10 Essential (primary) hypertension: Secondary | ICD-10-CM | POA: Diagnosis not present

## 2018-08-07 DIAGNOSIS — Z950 Presence of cardiac pacemaker: Secondary | ICD-10-CM | POA: Insufficient documentation

## 2018-08-07 DIAGNOSIS — F329 Major depressive disorder, single episode, unspecified: Secondary | ICD-10-CM | POA: Diagnosis not present

## 2018-08-07 DIAGNOSIS — I252 Old myocardial infarction: Secondary | ICD-10-CM | POA: Insufficient documentation

## 2018-08-07 DIAGNOSIS — R7303 Prediabetes: Secondary | ICD-10-CM | POA: Diagnosis not present

## 2018-08-07 DIAGNOSIS — Z7901 Long term (current) use of anticoagulants: Secondary | ICD-10-CM | POA: Insufficient documentation

## 2018-08-07 DIAGNOSIS — N529 Male erectile dysfunction, unspecified: Secondary | ICD-10-CM | POA: Diagnosis not present

## 2018-08-07 DIAGNOSIS — F1021 Alcohol dependence, in remission: Secondary | ICD-10-CM | POA: Insufficient documentation

## 2018-08-07 DIAGNOSIS — Z803 Family history of malignant neoplasm of breast: Secondary | ICD-10-CM | POA: Insufficient documentation

## 2018-08-07 DIAGNOSIS — Z86711 Personal history of pulmonary embolism: Secondary | ICD-10-CM | POA: Diagnosis present

## 2018-08-07 DIAGNOSIS — I2510 Atherosclerotic heart disease of native coronary artery without angina pectoris: Secondary | ICD-10-CM | POA: Diagnosis not present

## 2018-08-07 NOTE — Telephone Encounter (Signed)
Gave pt avs and calendar  °

## 2018-08-07 NOTE — Patient Instructions (Signed)
We discussed in detail yoiur prior history with pulmonary embolism and the possibility that you have an underlying tendency toward abnormal coagulation that predisposed due to a blood clot in the lungs.  Although there are tests available that can determine your hereditary predisposition to clot formation, those tests would not change your treatment either now or in the near future.  You should remain on Eliquis indefinitely.  With a implanted defibrillator, anticoagulation is mandatory.  Your cardiologist has discussed this with you for sure.  There is a value to coagulation testing, however, in that it will give information possibly useful to your siblings and children which may help direct their lifestyle since these hereditary factors do interact with behaviors which are changeable such as the use of hormonal birth control, smoking, excess weight gain, and pregnancy.  In other words counseling of them may be of considerable significance.  Although you do not wish to be tested at the present time, it is recommended that she give it some thought and discuss it in thorough detail with Dr. Wynetta Emery.  Barring any unforeseen complications, a follow-up visit has been scheduled to answer any questions and revisit the topic.  Please do not hesitate to call in the interim should any new or untoward problems arise.  Thank you! It was great meeting you!  Ladona Ridgel, MD Hematology/Oncology

## 2018-08-07 NOTE — Progress Notes (Signed)
Hematology/Oncology Outpatient  Initial Consultation  Patient Name:  Jonathan Dickson  DOB: 1956-06-21   Date of Service: August 07, 2018  Referring Provider: Karle Plumber, MD [place], [place] [address]   Consulting Physician: Henreitta Leber, MD Hematology/Oncology  Reason for Referral: In the setting of a prior pulmonary embolism on May 12, 2018 with associated acute hypoxic respiratory failure and secondarily decompensated congestive heart failure; currently on apixaban: 5 mg twice daily, he presents now for further discussion and consideration of both long-term anticoagulation and possibly a hypercoagulable evaluation.  History Present Illness: Jonathan Dickson is a 62 year old resident of [place] whose past medical history is significant for primary hypertension; coronary artery disease and prior myocardial infarctions (3) whose last myocardial infarction was 6 months earlier; nonischemic cardiomyopathy; recent placement of a permanent implanted cardiac pacemaker/defibrillator; chronic depression; erectile dysfunction; contrast prediabetes; and recovered alcoholic and drug user.  On an outpatient basis, he is followed by the heart failure service.  His primary care physician is Dr. Karle Plumber. He is alone at this first visit.  On May 11, 2018 he presented to the emergency department with an an acute onset of dyspnea both at rest and on exertion.  A thorough cardiac evaluation was initiated.  On August 13 CT thoracic angiography was performed the results of which revealed large bilateral pulmonary emboli, greater on the right than the left.  Noted also was cardiomegaly without pleural effusion.  There was no evidence of right heart strain on echocardiogram.  Bilateral duplex venous Dopplers did not reveal any evidence of deep vein thrombosis. Those results are detailed below. He was started on infusional heparin and later transitioned to apixaban.  At the time of  his discharge on August 15, he continued on apixaban at the present.  Although his father is deceased, he is estranged and does know the details of his medical history. He has a sister with breast cancer who had a deep vein thrombosis identified. He has never had a venous thromboembolic event previously.  He has tolerated apixaban without difficulty.  He has had no significant ill effects.There has been no bleeding tendency. He has been compliant with heart failure clinic appointments and interventions. On September 30, he had a pacemaker/defibrillator placed.  He has had no complications.  He has no epilepsy.  Although with penicillin he did have an isolated seizure years earlier.  He has no personal history of cancer. He reports no stroke syndrome. He denies thyroid disease.  He has no viral hepatitis, inflammatory bowel disease, or symptomatic diverticulosis.  He has chronic cervical back pain unchanged over his usual baseline.  He denies rheumatoid or gouty arthritis.  There is no prior blood disorder or bleeding tendency. Both his appetite and weight remain stable.  He reports no rash or itching.  There is no unusual headache, dizziness, lightheadedness, syncope, or near syncopal episodes.   He denies cough, sore throat, orthopnea.  He has no dyspnea either at rest or on minimal exertion. He denies pain or difficulty in swallowing.  No fever, shaking chills, sweats, or flulike symptoms are evident.  He has no heartburn or indigestion.  There is no nausea, vomiting, diarrhea, or constipation.  He reports no melena or bright red blood per rectum.  No urinary frequency, urgency, hematuria, or dysuria are reported.  He has no swelling of his ankles. There is no epistaxis or hemoptysis.  His overall energy level is improved.  His last screening colonoscopy was 11 years ago,  reportedly normal.  It is with this background he presents now for further discussion regarding the duration of anticoagulation and the  feasibility of a hypercoagulable evaluation as outlined above.  Past Medical History:  Diagnosis Date  . Abnormal TSH 02/2016  . Anxiety   . Cervical disc disease   . Chronic combined systolic and diastolic CHF (congestive heart failure) ([place])    a. 01/2016 Echo: EF 25%, inf AK, diffuse sev HK, Gr1 DD, mild MR.  . Depression   . Hyperglycemia   . Hypertension   . Hypertensive heart disease   . Myocardial infarction [facility])    ??  maybe  . NICM (nonischemic cardiomyopathy) ([place])    a. 01/2016 Echo: EF 25%, inf AK, diffuse sev HK, Gr1 DD, mild MR; b. 01/2016 MV: EF 22%, no isch/infarct;  c. 02/2016 Cath: Nl cors, EF 35-45%.  . Pre-diabetes   . Seizures ([place])    with penicillin   Past Surgical History:  Procedure Laterality Date  . ANTERIOR CERVICAL DECOMP/DISCECTOMY FUSION N/A 07/10/2016   Procedure: ANTERIOR CERVICAL DECOMPRESSION FUSION CERVICAL 4-5, CERVICAL 5-6, CERVICAL 6-7 WITH INSTRUMENTATION AND ALLOGRAFT;  Surgeon: [name], MD;  Location: [facility];  Service: Orthopedics;  Laterality: N/A;  . CARDIAC CATHETERIZATION N/A 03/11/2016   Procedure: Left Heart Cath and Coronary Angiography;  Surgeon: [name], MD;  Location: [facility];  Service: Cardiovascular;  Laterality: N/A;  . ICD IMPLANT N/A 06/17/2018   Procedure: ICD IMPLANT;  Surgeon: [name], MD;  Location: [facility];  Service: Cardiovascular;  Laterality: N/A;  . Thumb surgery Right    Family History  Problem Relation Age of Onset  . Diabetes Mother   . Diabetes Sister   Mother: Deceased: Age 77 years: Breast cancer Father: Deceased: Estranged He has no brothers Sisters (2): MI/DM/breast cancer/DVT  Social History   Socioeconomic History  . Marital status: Legally Separated    Spouse name: Not on file  . Number of children: 3  . Years of education: Not on file  . Highest education level: Not on file  Occupational History  . Not on file  Social Needs  . Financial resource  strain: Not on file  . Food insecurity:    Worry: Not on file    Inability: Not on file  . Transportation needs:    Medical: Not on file    Non-medical: Not on file  Tobacco Use  . Smoking status: Former Smoker    Packs/day: 1.50    Years: 8.00    Pack years: 12.00    Last attempt to quit: 03/15/2003    Years since quitting: 15.4  . Smokeless tobacco: Never Used  Substance and Sexual Activity  . Alcohol use: No    Comment: none since 2004  . Drug use: No    Comment: former  none since 2004  . Sexual activity: Not Currently  Lifestyle  . Physical activity:    Days per week: Not on file    Minutes per session: Not on file  . Stress: Not on file  Relationships  . Social connections:    Talks on phone: Not on file    Gets together: Not on file    Attends religious service: Not on file    Active member of club or organization: Not on file    Attends meetings of clubs or organizations: Not on file    Relationship status: Not on file  . Intimate partner violence:  Fear of current or ex partner: Not on file    Emotionally abused: Not on file    Physically abused: Not on file    Forced sexual activity: Not on file  Other Topics Concern  . Not on file  Social History Narrative  . Not on file  Jonathan Dickson is a widower. He is a former Chiropodist. He has 5 children without major medical problems. He is a recovered alcoholic. He stopped drinking 15 years ago. He began smoking at the age of 16 years. He smoked 1/2 pack of cigarettes daily. He stopped smoking 15 years ago. He had no significant use of pipe, cigars, or chewing tobacco. In the past he used heroin, LSD, and marijuana, now none.  Transfusion History: No prior transfusion  Exposure History: He has no known exposure to toxic chemicals, radiation, or pesticides.  Allergies  Allergen Reactions  . Peanut-Containing Drug Products Swelling  . Penicillins Other (See Comments)    CONVULSIONS Has  patient had a PCN reaction causing immediate rash, facial/tongue/throat swelling, SOB or lightheadedness with hypotension: No Has patient had a PCN reaction causing severe rash involving mucus membranes or skin necrosis: No Has patient had a PCN reaction that required hospitalization No Has patient had a PCN reaction occurring within the last 10 years: No If all of the above answers are "NO", then may proceed with Cephalosporin use.   . Decadron [Dexamethasone] Itching  Shellfish cause itching. He has nonspecific seasonal allergies  Current Outpatient Medications on File Prior to Visit  Medication Sig  . amitriptyline (ELAVIL) 25 MG tablet Take 1 tablet (25 mg total) by mouth at bedtime as needed for sleep.  Marland Kitchen apixaban (ELIQUIS) 5 MG TABS tablet Take 1 tablet (5 mg total) by mouth 2 (two) times daily.  . carvedilol (COREG) 12.5 MG tablet Take 1 tablet (12.5 mg total) by mouth 2 (two) times daily with a meal.  . cholecalciferol (VITAMIN D) 1000 units tablet Take 1,000 Units by mouth daily.  . furosemide (LASIX) 40 MG tablet Take 1 tablet (40 mg total) by mouth daily as needed for fluid (if weight gain more than 3 punds).  . hydrALAZINE (APRESOLINE) 25 MG tablet Take 1 tablet (25 mg total) by mouth 3 (three) times daily.  . metFORMIN (GLUCOPHAGE-XR) 500 MG 24 hr tablet TAKE 1 TABLET(500 MG) BY MOUTH DAILY WITH BREAKFAST  . sacubitril-valsartan (ENTRESTO) 49-51 MG Take 1 tablet by mouth 2 (two) times daily.  . sildenafil (VIAGRA) 25 MG tablet Take 1 tablet (25 mg total) by mouth daily as needed for erectile dysfunction.  Marland Kitchen spironolactone (ALDACTONE) 25 MG tablet Take 0.5 tablets (12.5 mg total) by mouth daily.  . Tafamidis Meglumine, Cardiac, (VYNDAQEL) 20 MG CAPS Take 80 mg by mouth.    No current facility-administered medications on file prior to visit.     Review of Systems: Constitutional: No fever, sweats, or shaking chills.  No appetite or weight deficit. Skin: No rash, scaling, sores,  lumps, or jaundice. HEENT: No visual changes or hearing deficit. Pulmonary: No unusual cough, sore throat, or orthopnea; former smoker. Cardiovascular: Known coronary artery disease; myocardial infarction (3); nonischemic cardiomyopathy; sinus bradycardia and placement of a permanent implantable pacemaker/defibrillator; primary hypertension compensated congestive heart failure; no dyslipidemia. Gastrointestinal: No indigestion, dysphagia, abdominal pain, diarrhea, or constipation.  No change in bowel habits.  No nausea or vomiting.  No melena or bright red blood per rectum. Genitourinary: No urinary frequency, urgency, hematuria, or dysuria; erectile dysfunction. Musculoskeletal: No  new arthralgias or myalgias; no joint swelling, pain, or instability; cervicalgia. Hematologic: No bleeding tendency or easy bruisability. Endocrine: No intolerance to hot or cold; no thyroid disease; prediabetes mellitus. Vascular: No peripheral arterial disease; prior bilateral pulmonary emboli in August, 2019; ongoing apixaban. Psychological: Chronic depression without suicidal ideation; no anxiety disorder or mood changes. Neurological: No dizziness, lightheadedness, syncope, or near syncopal episodes; no numbness or tingling in the fingers or toes.  Physical Examination: Vital Signs: Body surface area is 1.9 meters squared.  Vitals:   08/07/18 0858  BP: 115/75  Pulse: 84  Resp: 17  Temp: 97.7 F (36.5 C)  SpO2: 100%    Filed Weights   08/07/18 0858  Weight: 170 lb 4.8 oz (77.2 kg)  ECOG PERFORMANCE STATUS: 0 Constitutional:  Jonathan Dickson is a fully nourished and developed African-American.  He looks age appropriate.  He is friendly and cooperative without respiratory compromise at rest. Skin: No rashes, scaling, dryness, jaundice, or itching. HEENT: Head is normocephalic and atraumatic.  Pupils are equal round and reactive to light and accommodation.  Sclerae are anicteric. Conjunctivae are pink.  No  sinus tenderness nor oropharyngeal lesions.  Lips without cracking or peeling; tongue without mass, inflammation, or nodularity.  Mucous membranes are moist. Neck: Supple and symmetric.  No jugular venous distention or thyromegaly.  Trachea is midline. Lymphatics: No cervical or supraclavicular lymphadenopathy.  No epitrochlear, axillary, or inguinal lymphadenopathy is appreciated. Respiratory/chest: Thorax is symmetrical.  Breath sounds are clear to auscultation and percussion.  Normal excursion and respiratory effort. Back: Symmetric without deformity or tenderness. Cardiovascular: Heart rate and rhythm are regular. Gastrointestinal: Abdomen is soft, protuberant, nontender; no organomegaly.  Bowel sounds are normoactive.  No masses are appreciated. Extremities: In the lower extremities, there is no asymmetric swelling, erythema, tenderness, or cord formation.  No clubbing, cyanosis, nor edema. Hematologic: No petechiae, hematomas, or ecchymoses. Psychological:  He is oriented to person, place, and time; normal affect, memory, and cognition. Neurological: There are no gross neurologic deficits.  Laboratory Results: I have reviewed the data as listed: CBC Latest Ref Rng & Units 06/22/2018 05/21/2018 05/14/2018  WBC 4.0 - 10.5 K/uL 5.8 6.1 7.9  Hemoglobin 13.0 - 17.0 g/dL 15.0 14.6 15.0  Hematocrit 39.0 - 52.0 % 47.0 45.1 45.2  Platelets 150 - 400 K/uL 176 234 205    CMP Latest Ref Rng & Units 07/16/2018 06/29/2018 06/22/2018  Glucose 70 - 99 mg/dL 113(H) 99 118(H)  BUN 8 - 23 mg/dL 11 12 13   Creatinine 0.61 - 1.24 mg/dL 1.19 1.26(H) 1.16  Sodium 135 - 145 mmol/L 138 135 136  Potassium 3.5 - 5.1 mmol/L 4.5 5.0 4.9  Chloride 98 - 111 mmol/L 107 104 105  CO2 22 - 32 mmol/L 22 20(L) 23  Calcium 8.9 - 10.3 mg/dL 9.4 9.2 9.4  Total Protein 6.5 - 8.1 g/dL - - -  Total Bilirubin 0.3 - 1.2 mg/dL - - -  Alkaline Phos 38 - 126 U/L - - -  AST 15 - 41 U/L - - -  ALT 0 - 44 U/L - - -    Diagnostic/Imaging Studies: May 12, 2018 CT ANGIOGRAPHY CHEST WITH CONTRAST  TECHNIQUE: Multidetector CT imaging of the chest was performed using the standard protocol during bolus administration of intravenous contrast. Multiplanar CT image reconstructions and MIPs were obtained to evaluate the vascular anatomy.  CONTRAST:  65 mL ISOVUE-370 IOPAMIDOL (ISOVUE-370) INJECTION 76%  COMPARISON:  Radiograph of May 10, 2018. CT scan of April 06, 2018.  FINDINGS: Cardiovascular: Large filling defects are noted in the lower lobes of both pulmonary arteries, right greater than left. RV/LV ratio of 0.85 is noted which is within normal limits. Cardiomegaly is noted without pericardial effusion.  Mediastinum/Nodes: Moderate size hiatal hernia is noted. No adenopathy is noted. Bony thorax is unremarkable.  Lungs/Pleura: No pneumothorax or significant pleural effusion is noted. Minimal bilateral posterior basilar subsegmental atelectasis is noted.  Upper Abdomen: No acute abnormality.  Musculoskeletal: No chest wall abnormality. No acute or significant osseous findings.  Review of the MIP images confirms the above findings.  IMPRESSION: Large bilateral pulmonary emboli are noted. Critical Value/emergent results were called by telephone at the time of interpretation on 05/12/2018 at 8:47 am to Garner Nash, RN, who verbally acknowledged these results and will contact the ordering physician.  Moderate size hiatal hernia is noted.  Marijo Conception, M.D. 05/12/2018 08:48  Summary/Assessment: In the setting of a prior pulmonary embolism on May 12, 2018 with associated acute hypoxic respiratory failure and secondarily decompensated congestive heart failure; currently on apixaban: 5 mg twice daily, he presents now for further discussion and consideration of both long-term anticoagulation and possibly a hypercoagulable evaluation.  On May 11, 2018 he presented to  the emergency department with an an acute onset of dyspnea both at rest and on exertion.  A thorough cardiac evaluation was initiated.  On August 13 CT thoracic angiography was performed the results of which revealed large bilateral pulmonary emboli, greater on the right than the left.  Noted also was cardiomegaly without pleural effusion.  There was no evidence of right heart strain on echocardiogram. Bilateral duplex venous Dopplers did not reveal any evidence of deep vein thrombosis. Those results are detailed below. He was started on infusional unfractionated heparin and later transitioned to apixaban.  At the time of his hospital discharge on August 15, he continued apixaban. He continues on apixaban to the present.  Although his father is deceased, he is estranged and does know the details of his medical history. He has a sister with breast cancer who had a deep vein thrombosis identified. He has never had a venous thromboembolic event previously.  He has tolerated apixaban without difficulty.  He has had no significant ill effects.There has been no bleeding tendency. He has been compliant with heart failure clinic appointments and interventions. On September 30, he had a pacemaker/defibrillator placed.  He has had no complications.  He has no epilepsy.  Although with penicillin he did have an isolated seizure years earlier.  He has no personal history of cancer. He reports no stroke syndrome. He denies thyroid disease.  He has no viral hepatitis, inflammatory bowel disease, or symptomatic diverticulosis.  He has chronic cervical back pain unchanged over his usual baseline.  He denies rheumatoid or gouty arthritis.  There is no prior blood disorder or bleeding tendency. Both his appetite and weight remain stable.  He reports no rash or itching.  There is no unusual headache, dizziness, lightheadedness, syncope, or near syncopal episodes.   He denies cough, sore throat, orthopnea.  He has no dyspnea  either at rest or on minimal exertion. He denies pain or difficulty in swallowing.  No fever, shaking chills, sweats, or flulike symptoms are evident.  He has no heartburn or indigestion.  There is no nausea, vomiting, diarrhea, or constipation.  He reports no melena or bright red blood per rectum.  No urinary frequency, urgency, hematuria, or dysuria are reported.  He has  no swelling of his ankles. There is no epistaxis or hemoptysis.  His overall energy level is improved.  His last screening colonoscopy was 11 years ago, reportedly normal.    His other comorbid problems include primary hypertension; coronary artery disease and prior myocardial infarctions (3) whose last myocardial infarction was 6 months earlier; nonischemic cardiomyopathy; recent placement of a permanent implanted cardiac pacemaker/defibrillator; chronic depression; erectile dysfunction; contrast prediabetes; and recovered alcoholic and drug user.    Recommendation/Plan: I had a lengthy discussion with Jonathan Dickson regarding his prior bilateral pulmonary emboli.  Given the acute hypoxemic event with decompensated heart failure and diminished ejection fraction, this was a severe life-threatening venous thromboembolic event.  Fortunately he is doing well with apixaban.  He has no ill effects.  With placement of an implantable permanent pacemaker/defibrillator, anticoagulation is essential. He was advised by the heart failure cardiology service to continue apixaban indefinitely.  This raises the question of the value of a hypercoagulable work-up.  If long-term anticoagulation is indicated, whether he has a hereditary/acquired hypercoagulable defect or not, then treatment would not change in either event.  As discussed with Jonathan Dickson, a hypercoagulable evaluation does have benefit to his children and remaining siblings.  Should he have 1 or more of those gene mutations or independent risk factors, then his offspring could be counseled regarding their  likely risk and the importance of controlling any potentially provocative factors, such as oral contraception, elevated BMI, pregnancy, or prolonged periods of standing and sitting.  At the present time he wishes to forego either extensive testing or counseling as might be required.  Barring any unforeseen complications, a follow-up appointment has been scheduled for December 6 after speaking to Dr. Wynetta Emery and to reconsider a hypercoagulable evaluation with an opportunity to ask further questions.  The total time spent discussing his recent hospitalization, history of bilateral pulmonary emboli, the potential value of the hypercoagulable work-up and evaluation, and the importance of staying on apixaban in light of his implantable permanent pacemaker/defibrillator with recommendations was 60 minutes.  At least 50% of that time was spent in face-to-face discussion, reviewing outside records, laboratory evaluation, counseling, and answering questions.  There was ample time allotted to answer all of his questions.  This note was dictated using voice activated technology/software.  Unfortunately, typographical errors are not uncommon, and transcription is subject to mistakes and regrettably misinterpretation.  If necessary, clarification of the above information can be discussed with me at any time.  Thank you Dr. Wynetta Emery for allowing my participation in the care of Jonathan Dickson. Please do not hesitate to call should any questions arise regarding this initial consultation and discussion.  FOLLOW UP: AS DIRECTED   cc:      Karle Plumber, MD   Henreitta Leber, MD  Hematology/Oncology [place] [address] [place], [place] [address] Office: [phone] MBEM: [phone]

## 2018-08-12 ENCOUNTER — Telehealth (HOSPITAL_COMMUNITY): Payer: Self-pay

## 2018-08-12 NOTE — Telephone Encounter (Signed)
   Please call.  Continue eliquis for now. If nose bleeds gets worse. Please go to Urgent Care for an evaluation.   Krithik Mapel NP-C  3:35 PM

## 2018-08-12 NOTE — Telephone Encounter (Signed)
Pt called and stated that he has woke up the last 3 mornings with nose bleeds and he believes its coming from his blood thinner. Please advise.

## 2018-08-12 NOTE — Telephone Encounter (Signed)
Pt notified Verbalizes understanding 

## 2018-08-24 MED FILL — !VIAGRA 25 MG TABLET: 25 | 30 days supply | Qty: 10 | Fill #6

## 2018-09-04 ENCOUNTER — Inpatient Hospital Stay: Payer: Medicaid Other | Attending: Hematology and Oncology | Admitting: Hematology and Oncology

## 2018-09-07 ENCOUNTER — Other Ambulatory Visit (HOSPITAL_COMMUNITY): Payer: Self-pay | Admitting: Internal Medicine

## 2018-09-29 ENCOUNTER — Encounter: Payer: Self-pay | Admitting: Cardiology

## 2018-09-29 ENCOUNTER — Ambulatory Visit (INDEPENDENT_AMBULATORY_CARE_PROVIDER_SITE_OTHER): Payer: Medicaid Other | Admitting: Cardiology

## 2018-09-29 ENCOUNTER — Other Ambulatory Visit: Payer: Self-pay | Admitting: Internal Medicine

## 2018-09-29 VITALS — BP 124/66 | HR 96 | Ht 66.0 in | Wt 173.0 lb

## 2018-09-29 DIAGNOSIS — I428 Other cardiomyopathies: Secondary | ICD-10-CM | POA: Diagnosis not present

## 2018-09-29 DIAGNOSIS — I10 Essential (primary) hypertension: Secondary | ICD-10-CM

## 2018-09-29 DIAGNOSIS — I5022 Chronic systolic (congestive) heart failure: Secondary | ICD-10-CM

## 2018-09-29 DIAGNOSIS — N529 Male erectile dysfunction, unspecified: Secondary | ICD-10-CM

## 2018-09-29 LAB — CUP PACEART INCLINIC DEVICE CHECK
Date Time Interrogation Session: 20191231112611
HIGH POWER IMPEDANCE MEASURED VALUE: 68.625
Implantable Lead Implant Date: 20190918
Implantable Lead Model: 7122
Implantable Pulse Generator Implant Date: 20190918
Lead Channel Pacing Threshold Amplitude: 0.5 V
Lead Channel Pacing Threshold Amplitude: 0.5 V
Lead Channel Pacing Threshold Pulse Width: 0.5 ms
Lead Channel Pacing Threshold Pulse Width: 0.5 ms
Lead Channel Sensing Intrinsic Amplitude: 11.6 mV
MDC IDC LEAD LOCATION: 753860
MDC IDC MSMT BATTERY REMAINING LONGEVITY: 90 mo
MDC IDC MSMT LEADCHNL RV IMPEDANCE VALUE: 525 Ohm
MDC IDC SET LEADCHNL RV PACING AMPLITUDE: 2.5 V
MDC IDC SET LEADCHNL RV PACING PULSEWIDTH: 0.5 ms
MDC IDC SET LEADCHNL RV SENSING SENSITIVITY: 0.5 mV
MDC IDC STAT BRADY RV PERCENT PACED: 0 %
Pulse Gen Serial Number: 9796641

## 2018-09-29 MED FILL — VIAGRA 25 MG TABLET: 25 | 30 days supply | Qty: 10 | Fill #0

## 2018-09-29 NOTE — Patient Instructions (Signed)
Medication Instructions:  Your physician recommends that you continue on your current medications as directed. Please refer to the Current Medication list given to you today.  *If you need a refill on your cardiac medications before your next appointment, please call your pharmacy*  Labwork: None ordered  Testing/Procedures: None ordered  Follow-Up: Remote monitoring is used to monitor your Pacemaker or ICD from home. This monitoring reduces the number of office visits required to check your device to one time per year. It allows [place] to keep an eye on the functioning of your device to ensure it is working properly. You are scheduled for a device check from home on 12/29/2018. You may send your transmission at any time that day. If you have a wireless device, the transmission will be sent automatically. After your physician reviews your transmission, you will receive a postcard with your next transmission date.  Your physician wants you to follow-up in: 9 months with Dr. Curt Bears.  You will receive a reminder letter in the mail two months in advance. If you don't receive a letter, please call our office to schedule the follow-up appointment.  Thank you for choosing [facility]!!   Trinidad Curet, RN [phone]  Any Other Special Instructions Will Be Listed Below (If Applicable).

## 2018-09-29 NOTE — Progress Notes (Signed)
Electrophysiology Office Note   Date:  09/29/2018   ID:  Jonathan Dickson, Jonathan Dickson 12/02/1955, MRN [id number]  PCP:  Ladell Pier, MD  Cardiologist:  [place] Primary Electrophysiologist:  Giselle Brutus Meredith Leeds, MD    No chief complaint on file.    History of Present Illness: Jonathan Dickson is a 62 y.o. male who is being seen today for the evaluation of CHF at the request of [name]. Presenting today for electrophysiology evaluation.  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  He had a left heart catheterization that was essentially normal.  Cardiac MRI was performed that showed an EF of 28% suggestive of amyloidosis.  He is currently on optimal medical therapy.  Is in today for evaluation of ICD implant.  He is currently feeling well without major complaint.  He was admitted to the hospital with discharge 05/14/2018 with shortness of breath.  He was found to have a large bilateral pulmonary embolism and was put on Eliquis.  He is felt well since that time.  He has had no further episodes of shortness of breath.  Today, denies symptoms of palpitations, chest pain, shortness of breath, orthopnea, PND, lower extremity edema, claudication, dizziness, presyncope, syncope, bleeding, or neurologic sequela. The patient is tolerating medications without difficulties.  Overall he is feeling well.  He has no chest pain or shortness of breath.  He had a [place] ICD implanted 06/17/2018.     Past Medical History:  Diagnosis Date  . Abnormal TSH 02/2016  . Anxiety   . Cervical disc disease   . Chronic combined systolic and diastolic CHF (congestive heart failure) ([place])    a. 01/2016 Echo: EF 25%, inf AK, diffuse sev HK, Gr1 DD, mild MR.  . Depression   . Hyperglycemia   . Hypertension   . Hypertensive heart disease   . Myocardial infarction [facility])    ??  maybe  . NICM  (nonischemic cardiomyopathy) ([place])    a. 01/2016 Echo: EF 25%, inf AK, diffuse sev HK, Gr1 DD, mild MR; b. 01/2016 MV: EF 22%, no isch/infarct;  c. 02/2016 Cath: Nl cors, EF 35-45%.  . Pre-diabetes   . Seizures ([place])    with penicillin   Past Surgical History:  Procedure Laterality Date  . ANTERIOR CERVICAL DECOMP/DISCECTOMY FUSION N/A 07/10/2016   Procedure: ANTERIOR CERVICAL DECOMPRESSION FUSION CERVICAL 4-5, CERVICAL 5-6, CERVICAL 6-7 WITH INSTRUMENTATION AND ALLOGRAFT;  Surgeon: [name], MD;  Location: [place];  Service: Orthopedics;  Laterality: N/A;  . CARDIAC CATHETERIZATION N/A 03/11/2016   Procedure: Left Heart Cath and Coronary Angiography;  Surgeon: [name], MD;  Location: [place] CV [facility];  Service: Cardiovascular;  Laterality: N/A;  . ICD IMPLANT N/A 06/17/2018   Procedure: ICD IMPLANT;  Surgeon: [name], MD;  Location: [place] [facility];  Service: Cardiovascular;  Laterality: N/A;  . Thumb surgery Right      Current Outpatient Medications  Medication Sig Dispense Refill  . amitriptyline (ELAVIL) 25 MG tablet Take 1 tablet (25 mg total) by mouth at bedtime as needed for sleep. 30 tablet 3  . apixaban (ELIQUIS) 5 MG TABS tablet Take 1 tablet (5 mg total) by mouth 2 (two) times daily. 60 tablet 6  . carvedilol (COREG) 12.5 MG tablet Take 1 tablet (12.5 mg total) by mouth 2 (two) times daily with a meal. 60 tablet 6  .  cholecalciferol (VITAMIN D) 1000 units tablet Take 1,000 Units by mouth daily.    Marland Kitchen ENTRESTO 49-51 MG TAKE 1 TABLET BY MOUTH TWICE DAILY 60 tablet 3  . furosemide (LASIX) 40 MG tablet Take 1 tablet (40 mg total) by mouth daily as needed for fluid (if weight gain more than 3 punds). 30 tablet 0  . hydrALAZINE (APRESOLINE) 25 MG tablet Take 1 tablet (25 mg total) by mouth 3 (three) times daily. 90 tablet 11  . metFORMIN (GLUCOPHAGE-XR) 500 MG 24 hr tablet TAKE 1 TABLET(500 MG) BY MOUTH DAILY WITH BREAKFAST 30 tablet 2  . sildenafil (VIAGRA) 25  MG tablet Take 1 tablet (25 mg total) by mouth daily as needed for erectile dysfunction. 10 tablet 6  . spironolactone (ALDACTONE) 25 MG tablet Take 0.5 tablets (12.5 mg total) by mouth daily. 15 tablet 11  . Tafamidis Meglumine, Cardiac, (VYNDAQEL) 20 MG CAPS Take 80 mg by mouth.      No current facility-administered medications for this visit.     Allergies:   Peanut-containing drug products; Penicillins; and Decadron [dexamethasone]   Social History:  The patient  reports that he quit smoking about 15 years ago. He has a 12.00 pack-year smoking history. He has never used smokeless tobacco. He reports that he does not drink alcohol or use drugs.   Family History:  The patient's family history includes Diabetes in his mother and sister.   ROS:  Please see the history of present illness.   Otherwise, review of systems is positive for none.   All other systems are reviewed and negative.   PHYSICAL EXAM: VS:  There were no vitals taken for this visit. , BMI There is no height or weight on file to calculate BMI. GEN: Well nourished, well developed, in no acute distress  HEENT: normal  Neck: no JVD, carotid bruits, or masses Cardiac: RRR; no murmurs, rubs, or gallops,no edema  Respiratory:  clear to auscultation bilaterally, normal work of breathing GI: soft, nontender, nondistended, + BS MS: no deformity or atrophy  Skin: warm and dry, device site well healed Neuro:  Strength and sensation are intact Psych: euthymic mood, full affect  EKG:  EKG is ordered today. Personal review of the ekg ordered shows minus rhythm, rate 96  Personal review of the device interrogation today. Results in [place]: 05/11/2018: ALT 22 05/13/2018: Magnesium 2.3 05/21/2018: B Natriuretic Peptide 149.2 06/22/2018: Hemoglobin 15.0; Platelets 176 07/16/2018: BUN 11; Creatinine, Ser 1.19; Potassium 4.5; Sodium 138    Lipid Panel     Component Value Date/Time   CHOL 155 10/04/2014 1508   TRIG  87 10/04/2014 1508   HDL 54 10/04/2014 1508   CHOLHDL 2.9 10/04/2014 1508   VLDL 17 10/04/2014 1508   LDLCALC 84 10/04/2014 1508     Wt Readings from Last 3 Encounters:  08/07/18 170 lb 4.8 oz (77.2 kg)  07/21/18 174 lb (78.9 kg)  06/22/18 169 lb 3.2 oz (76.7 kg)      Other studies Reviewed: Additional studies/ records that were reviewed today include: TTE 05/11/18  Review of the above records today demonstrates:  - Left ventricle: The cavity size was mildly dilated. Systolic   function was severely reduced. The estimated ejection fraction   was in the range of 25% to 30%. Images were inadequate for LV   wall motion assessment even with definity contrast There was an   increased relative contribution of atrial contraction to   ventricular filling. Doppler parameters  are consistent with   abnormal left ventricular relaxation (grade 1 diastolic   dysfunction). - Aortic valve: Probably trileaflet; normal thickness, mildly   calcified leaflets. - Mitral valve: There was mild to moderate regurgitation. - Pulmonary arteries: Systolic pressure could not be accurately   estimated.  CMRI 04/01/18 1. Mild LVE with mild LVH septal thickness 13 mm. Diffuse hypokinesis worse in the septum apex and lateral walls EF 28%  2. 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  Chronic systolic heart failure due to nonischemic cardiomyopathy: Currently on optimal medical therapy with Entresto and Coreg.  Cardiac MRI consistent with amyloidosis.  Status post [place] ICD implanted 06/17/2018.  Device functioning appropriately.  No changes.  2.  Hypertension: Currently well controlled.  No changes.  3.  Pulmonary embolism: Currently on Eliquis.  Plan per primary  team   Current medicines are reviewed at length with the patient today.   The patient does not have concerns regarding his medicines.  The following changes were made today:  none  Labs/ tests ordered today include:  No orders of the defined types were placed in this encounter.   Disposition:   FU with Jatavian Calica 9 months  Signed, Ronav Furney Meredith Leeds, MD  09/29/2018 8:05 AM     [facility] [address] [place] [address] [phone] (office) [phone] (fax)

## 2018-10-01 ENCOUNTER — Encounter (HOSPITAL_COMMUNITY): Payer: Self-pay

## 2018-10-01 ENCOUNTER — Emergency Department (HOSPITAL_COMMUNITY)
Admission: EM | Admit: 2018-10-01 | Discharge: 2018-10-02 | Disposition: A | Discharge status: Home / Self Care | Payer: Medicaid Other | Attending: Emergency Medicine | Admitting: Emergency Medicine

## 2018-10-01 DIAGNOSIS — I110 Hypertensive heart disease with heart failure: Secondary | ICD-10-CM | POA: Insufficient documentation

## 2018-10-01 DIAGNOSIS — I5042 Chronic combined systolic (congestive) and diastolic (congestive) heart failure: Secondary | ICD-10-CM | POA: Insufficient documentation

## 2018-10-01 DIAGNOSIS — I428 Other cardiomyopathies: Secondary | ICD-10-CM | POA: Insufficient documentation

## 2018-10-01 DIAGNOSIS — N289 Disorder of kidney and ureter, unspecified: Secondary | ICD-10-CM | POA: Diagnosis not present

## 2018-10-01 DIAGNOSIS — Z87891 Personal history of nicotine dependence: Secondary | ICD-10-CM | POA: Diagnosis not present

## 2018-10-01 DIAGNOSIS — Z91010 Allergy to peanuts: Secondary | ICD-10-CM | POA: Insufficient documentation

## 2018-10-01 DIAGNOSIS — I252 Old myocardial infarction: Secondary | ICD-10-CM | POA: Diagnosis not present

## 2018-10-01 DIAGNOSIS — Z7984 Long term (current) use of oral hypoglycemic drugs: Secondary | ICD-10-CM | POA: Diagnosis not present

## 2018-10-01 DIAGNOSIS — K439 Ventral hernia without obstruction or gangrene: Secondary | ICD-10-CM | POA: Diagnosis not present

## 2018-10-01 DIAGNOSIS — Z79899 Other long term (current) drug therapy: Secondary | ICD-10-CM | POA: Diagnosis not present

## 2018-10-01 DIAGNOSIS — Z7901 Long term (current) use of anticoagulants: Secondary | ICD-10-CM | POA: Insufficient documentation

## 2018-10-01 DIAGNOSIS — R1033 Periumbilical pain: Secondary | ICD-10-CM | POA: Diagnosis present

## 2018-10-01 LAB — CBC
HCT: 44 % (ref 39.0–52.0)
Hemoglobin: 14.2 g/dL (ref 13.0–17.0)
MCH: 28.1 pg (ref 26.0–34.0)
MCHC: 32.3 g/dL (ref 30.0–36.0)
MCV: 87 fL (ref 80.0–100.0)
Platelets: 210 10*3/uL (ref 150–400)
RBC: 5.06 MIL/uL (ref 4.22–5.81)
RDW: 14.2 % (ref 11.5–15.5)
WBC: 6.7 10*3/uL (ref 4.0–10.5)
nRBC: 0 % (ref 0.0–0.2)

## 2018-10-01 LAB — COMPREHENSIVE METABOLIC PANEL
ALT: 15 U/L (ref 0–44)
AST: 27 U/L (ref 15–41)
Albumin: 3.5 g/dL (ref 3.5–5.0)
Alkaline Phosphatase: 56 U/L (ref 38–126)
Anion gap: 11 (ref 5–15)
BUN: 19 mg/dL (ref 8–23)
CO2: 24 mmol/L (ref 22–32)
Calcium: 9 mg/dL (ref 8.9–10.3)
Chloride: 104 mmol/L (ref 98–111)
Creatinine, Ser: 1.28 mg/dL — ABNORMAL HIGH (ref 0.61–1.24)
GFR calc Af Amer: >60 mL/min (ref >60)
GFR calc non Af Amer: 59 mL/min — ABNORMAL LOW (ref >60)
Glucose, Bld: 116 mg/dL — ABNORMAL HIGH (ref 70–99)
POTASSIUM: 4.7 mmol/L (ref 3.5–5.1)
SODIUM: 139 mmol/L (ref 135–145)
Total Bilirubin: 1 mg/dL (ref 0.3–1.2)
Total Protein: 6.4 g/dL — ABNORMAL LOW (ref 6.5–8.1)

## 2018-10-01 LAB — URINALYSIS, ROUTINE W REFLEX MICROSCOPIC
Bilirubin Urine: NEGATIVE
Glucose, UA: NEGATIVE mg/dL
Hgb urine dipstick: NEGATIVE
Ketones, ur: NEGATIVE mg/dL
Leukocytes, UA: NEGATIVE
Nitrite: NEGATIVE
Protein, ur: NEGATIVE mg/dL
SPECIFIC GRAVITY, URINE: 1.029 (ref 1.005–1.030)
pH: 6 (ref 5.0–8.0)

## 2018-10-01 LAB — LIPASE, BLOOD: Lipase: 25 U/L (ref 11–51)

## 2018-10-01 NOTE — ED Triage Notes (Signed)
Pt reports abd pain 2 days ago and now has a hard lump ubove umbilicus. Denies hx of hernia. Pt denies n.v.

## 2018-10-01 NOTE — ED Notes (Signed)
Pt States he has Defibrillator implanted

## 2018-10-02 ENCOUNTER — Emergency Department (HOSPITAL_COMMUNITY): Payer: Medicaid Other

## 2018-10-02 LAB — CBC WITH DIFFERENTIAL/PLATELET
Abs Immature Granulocytes: 0.03 10*3/uL (ref 0.00–0.07)
BASOS PCT: 1 %
Basophils Absolute: 0 10*3/uL (ref 0.0–0.1)
Eosinophils Absolute: 0.2 10*3/uL (ref 0.0–0.5)
Eosinophils Relative: 4 %
HCT: 43.3 % (ref 39.0–52.0)
Hemoglobin: 14.2 g/dL (ref 13.0–17.0)
Immature Granulocytes: 1 %
Lymphocytes Relative: 39 %
Lymphs Abs: 2.3 10*3/uL (ref 0.7–4.0)
MCH: 28.7 pg (ref 26.0–34.0)
MCHC: 32.8 g/dL (ref 30.0–36.0)
MCV: 87.5 fL (ref 80.0–100.0)
Monocytes Absolute: 0.6 10*3/uL (ref 0.1–1.0)
Monocytes Relative: 11 %
Neutro Abs: 2.7 10*3/uL (ref 1.7–7.7)
Neutrophils Relative %: 44 %
Platelets: 203 10*3/uL (ref 150–400)
RBC: 4.95 MIL/uL (ref 4.22–5.81)
RDW: 14.4 % (ref 11.5–15.5)
WBC: 5.9 10*3/uL (ref 4.0–10.5)
nRBC: 0 % (ref 0.0–0.2)

## 2018-10-02 MED ORDER — IOHEXOL 300 MG/ML  SOLN
100.0000 mL | Freq: Once | INTRAMUSCULAR | Status: AC
  Administered 2018-10-02: 100 mL via INTRAVENOUS

## 2018-10-02 NOTE — ED Notes (Signed)
Notified CT patient has an IV.

## 2018-10-02 NOTE — ED Provider Notes (Signed)
[facility] EMERGENCY DEPARTMENT Provider Note   CSN: [id number] Arrival date & time: 10/01/18  2117     History   Chief Complaint Chief Complaint  Patient presents with  . Abdominal Pain    HPI Jonathan Dickson is a 63 y.o. male.  HPI Complains of abdominal pain immediately superior to umbilicus which is localized to approximately golf ball sized area which started 2 or 3 days ago.  Nothing makes symptoms better or worse.  Pain is mild at present.  Last bowel movement was yesterday, "runny.  No blood per rectum.  No other associated symptoms.  Last ate yesterday 3 PM.  No urinary symptoms no fever.  No nausea or vomiting. Past Medical History:  Diagnosis Date  . Abnormal TSH 02/2016  . Anxiety   . Cervical disc disease   . Chronic combined systolic and diastolic CHF (congestive heart failure) ([place])    a. 01/2016 Echo: EF 25%, inf AK, diffuse sev HK, Gr1 DD, mild MR.  . Depression   . Hyperglycemia   . Hypertension   . Hypertensive heart disease   . Myocardial infarction [facility])    ??  maybe  . NICM (nonischemic cardiomyopathy) ([place])    a. 01/2016 Echo: EF 25%, inf AK, diffuse sev HK, Gr1 DD, mild MR; b. 01/2016 MV: EF 22%, no isch/infarct;  c. 02/2016 Cath: Nl cors, EF 35-45%.  . Pre-diabetes   . Seizures ([place])    with penicillin    Patient Active Problem List   Diagnosis Date Noted  . Prediabetes 07/21/2018  . 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 Chronic systolic heart failure ([place]) 06/17/2018  . Bilateral pulmonary embolism ([place]) 05/12/2018  . Heart failure ([place]) 05/11/2018  . Claudication in peripheral vascular disease ([place]) 12/31/2017  . Decreased hearing of both ears 11/10/2017  . Cervical disc disease with myelopathy 07/10/2016  . NICM (nonischemic cardiomyopathy) ([place]) 03/12/2016  . Congestive dilated cardiomyopathy ([place]) 03/11/2016  . Cervical radiculitis 04/19/2015  . Essential hypertension 09/27/2014    Past Surgical History:  Procedure  Laterality Date  . ANTERIOR CERVICAL DECOMP/DISCECTOMY FUSION N/A 07/10/2016   Procedure: ANTERIOR CERVICAL DECOMPRESSION FUSION CERVICAL 4-5, CERVICAL 5-6, CERVICAL 6-7 WITH INSTRUMENTATION AND ALLOGRAFT;  Surgeon: [name], MD;  Location: [place];  Service: Orthopedics;  Laterality: N/A;  . CARDIAC CATHETERIZATION N/A 03/11/2016   Procedure: Left Heart Cath and Coronary Angiography;  Surgeon: [name], MD;  Location: [place] [facility];  Service: Cardiovascular;  Laterality: N/A;  . ICD IMPLANT N/A 06/17/2018   Procedure: ICD IMPLANT;  Surgeon: [name], MD;  Location: [place] [facility];  Service: Cardiovascular;  Laterality: N/A;  . Thumb surgery Right         Home Medications    Prior to Admission medications   Medication Sig Start Date End Date Taking? Authorizing Provider  amitriptyline (ELAVIL) 25 MG tablet Take 1 tablet (25 mg total) by mouth at bedtime as needed for sleep. 07/21/18   Ladell Pier, MD  apixaban (ELIQUIS) 5 MG TABS tablet Take 1 tablet (5 mg total) by mouth 2 (two) times daily. 06/15/18   Bensimhon, Shaune Pascal, MD  carvedilol (COREG) 12.5 MG tablet Take 1 tablet (12.5 mg total) by mouth 2 (two) times daily with a meal. 07/21/18   Ladell Pier, MD  cholecalciferol (VITAMIN D) 1000 units tablet Take 1,000 Units by mouth daily.    [provider]  ENTRESTO 49-51 MG TAKE 1 TABLET BY MOUTH  TWICE DAILY 09/07/18   Bensimhon, Shaune Pascal, MD  furosemide (LASIX) 40 MG tablet Take 1 tablet (40 mg total) by mouth daily as needed for fluid (if weight gain more than 3 punds). 05/14/18 05/14/19  Ghimire, Henreitta Leber, MD  hydrALAZINE (APRESOLINE) 25 MG tablet Take 1 tablet (25 mg total) by mouth 3 (three) times daily. 06/22/18   Georgiana Shore, NP  metFORMIN (GLUCOPHAGE-XR) 500 MG 24 hr tablet TAKE 1 TABLET(500 MG) BY MOUTH DAILY WITH BREAKFAST 07/24/18   Ladell Pier, MD  spironolactone (ALDACTONE) 25 MG tablet Take 0.5 tablets (12.5 mg total)  by mouth daily. 06/22/18   Georgiana Shore, NP  Tafamidis Meglumine, Cardiac, (VYNDAQEL) 20 MG CAPS Take 80 mg by mouth.     [provider]  VIAGRA 25 MG tablet TAKE 1 TABLET BY MOUTH EVERY DAIY AS NEEDED FOR ERECTILE DYSFUNCTION 09/29/18   Ladell Pier, MD    Family History Family History  Problem Relation Age of Onset  . Diabetes Mother   . Diabetes Sister     Social History Social History   Tobacco Use  . Smoking status: Former Smoker    Packs/day: 1.50    Years: 8.00    Pack years: 12.00    Last attempt to quit: 03/15/2003    Years since quitting: 15.5  . Smokeless tobacco: Never Used  Substance Use Topics  . Alcohol use: No    Comment: none since 2004  . Drug use: No    Comment: former  none since 2004     Allergies   Peanut-containing drug products; Penicillins; and Decadron [dexamethasone]   Review of Systems Review of Systems  Constitutional: Negative.   HENT: Negative.   Respiratory: Negative.   Cardiovascular: Negative.   Gastrointestinal: Positive for abdominal pain.  Musculoskeletal: Negative.   Skin: Negative.   Neurological: Negative.   Hematological: Bruises/bleeds easily.  Psychiatric/Behavioral: Negative.   All other systems reviewed and are negative.    Physical Exam Updated Vital Signs BP (!) 142/79 (BP Location: Left Arm)   Pulse 87   Temp 97.6 F (36.4 C) (Oral)   Resp 17   SpO2 97%   Physical Exam Vitals signs and nursing note reviewed.  Constitutional:      Appearance: He is well-developed.  HENT:     Head: Normocephalic and atraumatic.  Eyes:     Conjunctiva/sclera: Conjunctivae normal.     Pupils: Pupils are equal, round, and reactive to light.  Neck:     Musculoskeletal: Neck supple.     Thyroid: No thyromegaly.     Trachea: No tracheal deviation.  Cardiovascular:     Rate and Rhythm: Normal rate and regular rhythm.     Heart sounds: No murmur.  Pulmonary:     Effort: Pulmonary effort is normal.      Breath sounds: Normal breath sounds.  Abdominal:     General: Bowel sounds are normal. There is no distension.     Palpations: Abdomen is soft.     Tenderness: There is no abdominal tenderness.     Hernia: A hernia is present.     Comments: Golf ball sized ventral hernia immediately superior to umbilicus which is tender, not warm or red.  Not spontaneously reducible.  Genitourinary:    Penis: Normal.      Scrotum/Testes: Normal.  Musculoskeletal: Normal range of motion.        General: No tenderness.  Skin:    General: Skin is warm and dry.  Findings: No rash.  Neurological:     Mental Status: He is alert.     Coordination: Coordination normal.      ED Treatments / Results  Labs (all labs ordered are listed, but only abnormal results are displayed) Labs Reviewed  COMPREHENSIVE METABOLIC PANEL - Abnormal; Notable for the following components:      Result Value   Glucose, Bld 116 (*)    Creatinine, Ser 1.28 (*)    Total Protein 6.4 (*)    GFR calc non Af Amer 59 (*)    All other components within normal limits  LIPASE, BLOOD  CBC  URINALYSIS, ROUTINE W REFLEX MICROSCOPIC  CBC WITH DIFFERENTIAL/PLATELET    EKG None  Radiology No results found.  Procedures Procedures (including critical care time)  Medications Ordered in ED Medications - No data to display  Results for orders placed or performed during the hospital encounter of 10/01/18  Lipase, blood  Result Value Ref Range   Lipase 25 11 - 51 U/L  Comprehensive metabolic panel  Result Value Ref Range   Sodium 139 135 - 145 mmol/L   Potassium 4.7 3.5 - 5.1 mmol/L   Chloride 104 98 - 111 mmol/L   CO2 24 22 - 32 mmol/L   Glucose, Bld 116 (H) 70 - 99 mg/dL   BUN 19 8 - 23 mg/dL   Creatinine, Ser 1.28 (H) 0.61 - 1.24 mg/dL   Calcium 9.0 8.9 - 10.3 mg/dL   Total Protein 6.4 (L) 6.5 - 8.1 g/dL   Albumin 3.5 3.5 - 5.0 g/dL   AST 27 15 - 41 U/L   ALT 15 0 - 44 U/L   Alkaline Phosphatase 56 38 - 126 U/L    Total Bilirubin 1.0 0.3 - 1.2 mg/dL   GFR calc non Af Amer 59 (L) >60 mL/min   GFR calc Af Amer >60 >60 mL/min   Anion gap 11 5 - 15  CBC  Result Value Ref Range   WBC 6.7 4.0 - 10.5 K/uL   RBC 5.06 4.22 - 5.81 MIL/uL   Hemoglobin 14.2 13.0 - 17.0 g/dL   HCT 44.0 39.0 - 52.0 %   MCV 87.0 80.0 - 100.0 fL   MCH 28.1 26.0 - 34.0 pg   MCHC 32.3 30.0 - 36.0 g/dL   RDW 14.2 11.5 - 15.5 %   Platelets 210 150 - 400 K/uL   nRBC 0.0 0.0 - 0.2 %  Urinalysis, Routine w reflex microscopic  Result Value Ref Range   Color, Urine YELLOW YELLOW   APPearance CLEAR CLEAR   Specific Gravity, Urine 1.029 1.005 - 1.030   pH 6.0 5.0 - 8.0   Glucose, UA NEGATIVE NEGATIVE mg/dL   Hgb urine dipstick NEGATIVE NEGATIVE   Bilirubin Urine NEGATIVE NEGATIVE   Ketones, ur NEGATIVE NEGATIVE mg/dL   Protein, ur NEGATIVE NEGATIVE mg/dL   Nitrite NEGATIVE NEGATIVE   Leukocytes, UA NEGATIVE NEGATIVE  CBC with Differential/Platelet  Result Value Ref Range   WBC 5.9 4.0 - 10.5 K/uL   RBC 4.95 4.22 - 5.81 MIL/uL   Hemoglobin 14.2 13.0 - 17.0 g/dL   HCT 43.3 39.0 - 52.0 %   MCV 87.5 80.0 - 100.0 fL   MCH 28.7 26.0 - 34.0 pg   MCHC 32.8 30.0 - 36.0 g/dL   RDW 14.4 11.5 - 15.5 %   Platelets 203 150 - 400 K/uL   nRBC 0.0 0.0 - 0.2 %   Neutrophils Relative % 44 %  Neutro Abs 2.7 1.7 - 7.7 K/uL   Lymphocytes Relative 39 %   Lymphs Abs 2.3 0.7 - 4.0 K/uL   Monocytes Relative 11 %   Monocytes Absolute 0.6 0.1 - 1.0 K/uL   Eosinophils Relative 4 %   Eosinophils Absolute 0.2 0.0 - 0.5 K/uL   Basophils Relative 1 %   Basophils Absolute 0.0 0.0 - 0.1 K/uL   Immature Granulocytes 1 %   Abs Immature Granulocytes 0.03 0.00 - 0.07 K/uL   Ct Abdomen Pelvis W Contrast  Result Date: 10/02/2018 CLINICAL DATA:  Abdominal pain with supraumbilical lump for 3 days. EXAM: CT ABDOMEN AND PELVIS WITH CONTRAST TECHNIQUE: Multidetector CT imaging of the abdomen and pelvis was performed using the standard protocol following bolus  administration of intravenous contrast. CONTRAST:  175mL OMNIPAQUE IOHEXOL 300 MG/ML  SOLN COMPARISON:  Right upper quadrant ultrasound 02/20/2016 FINDINGS: Lower chest: Mild atelectasis in the lung bases. No pleural effusion. ICD lead in the right ventricle. Hepatobiliary: No focal liver abnormality is seen. No gallstones, gallbladder wall thickening, or biliary dilatation. Pancreas: Unremarkable. Spleen: Unremarkable. Adrenals/Urinary Tract: Unremarkable adrenal glands. No evidence of renal calculi or hydronephrosis. Mild left lower pole renal scarring. Subcentimeter low-density left lower pole renal lesion, likely a cyst. Unremarkable bladder. Stomach/Bowel: There is a small sliding hiatal hernia. There is no evidence of bowel obstruction. Left-sided colonic diverticulosis is noted without evidence of diverticulitis. The appendix is not clearly identified, however no inflammatory changes are present in the right lower quadrant. Vascular/Lymphatic: Abdominal aortic atherosclerosis without aneurysm. No enlarged lymph nodes. Reproductive: Unremarkable prostate. Other: No ascites or pneumoperitoneum. Small epigastric ventral hernia containing fat and minimal fluid with mild-to-moderate fat stranding. No bowel herniation. Musculoskeletal: Severe L4-5 facet arthrosis with grade 1 anterolisthesis and moderately severe disc degeneration. IMPRESSION: 1. Small fat-containing epigastric ventral hernia with mild-to-moderate acute inflammation. 2. Colonic diverticulosis without evidence of diverticulitis. 3. Small hiatal hernia. 4. Aortic Atherosclerosis (ICD10-I70.0). Electronically Signed   By: Logan Bores M.D.   On: 10/02/2018 09:32   Initial Impression / Assessment and Plan / ED Course  I have reviewed the triage vital signs and the nursing notes.  Pertinent labs & imaging results that were available during my care of the patient were reviewed by me and considered in my medical decision making (see chart for  details).     Lab work remarkable for mild renal insufficiency otherwise normal 11 AM patient resting comfortably.  He does not require emergency surgery.  He is comfortable.  Not ill-appearing.  Hernia contains only fat.  He is currently on Eliquis.  Case discussed with physicians assistant from [facility] surgery plan patient to call office to schedule appointment Final Clinical Impressions(s) / ED Diagnoses  Diagnosis #1 ventral hernia #2 mild renal insufficiency Final diagnoses:  None    ED Discharge Orders    None       Orlie Dakin, MD 10/02/18 1130

## 2018-10-02 NOTE — ED Notes (Signed)
IV team at bedside 

## 2018-10-02 NOTE — Discharge Instructions (Signed)
Call the [facility] surgery office to schedule the next available appointment to inquire about getting your hernia repaired.  Take Tylenol as directed for pain.  Return to the emergency department if you develop fever, vomiting, or if pain is not well controlled with Tylenol

## 2018-10-08 ENCOUNTER — Encounter (HOSPITAL_COMMUNITY): Payer: Self-pay | Admitting: Internal Medicine

## 2018-10-08 ENCOUNTER — Ambulatory Visit (HOSPITAL_COMMUNITY)
Admission: RE | Admit: 2018-10-08 | Discharge: 2018-10-08 | Disposition: A | Discharge status: Home / Self Care | Payer: Medicaid Other | Source: Ambulatory Visit | Attending: Internal Medicine | Admitting: Internal Medicine

## 2018-10-08 VITALS — BP 136/92 | HR 107 | Wt 177.6 lb

## 2018-10-08 DIAGNOSIS — I252 Old myocardial infarction: Secondary | ICD-10-CM | POA: Insufficient documentation

## 2018-10-08 DIAGNOSIS — Z833 Family history of diabetes mellitus: Secondary | ICD-10-CM | POA: Insufficient documentation

## 2018-10-08 DIAGNOSIS — E854 Organ-limited amyloidosis: Secondary | ICD-10-CM

## 2018-10-08 DIAGNOSIS — I110 Hypertensive heart disease with heart failure: Secondary | ICD-10-CM | POA: Diagnosis present

## 2018-10-08 DIAGNOSIS — I5042 Chronic combined systolic (congestive) and diastolic (congestive) heart failure: Secondary | ICD-10-CM | POA: Diagnosis present

## 2018-10-08 DIAGNOSIS — M5090 Cervical disc disorder, unspecified, unspecified cervical region: Secondary | ICD-10-CM | POA: Diagnosis not present

## 2018-10-08 DIAGNOSIS — F419 Anxiety disorder, unspecified: Secondary | ICD-10-CM | POA: Diagnosis not present

## 2018-10-08 DIAGNOSIS — Z7901 Long term (current) use of anticoagulants: Secondary | ICD-10-CM | POA: Insufficient documentation

## 2018-10-08 DIAGNOSIS — I43 Cardiomyopathy in diseases classified elsewhere: Secondary | ICD-10-CM

## 2018-10-08 DIAGNOSIS — I5022 Chronic systolic (congestive) heart failure: Secondary | ICD-10-CM | POA: Diagnosis not present

## 2018-10-08 DIAGNOSIS — I428 Other cardiomyopathies: Secondary | ICD-10-CM | POA: Insufficient documentation

## 2018-10-08 DIAGNOSIS — Z880 Allergy status to penicillin: Secondary | ICD-10-CM | POA: Diagnosis not present

## 2018-10-08 DIAGNOSIS — N179 Acute kidney failure, unspecified: Secondary | ICD-10-CM | POA: Diagnosis not present

## 2018-10-08 DIAGNOSIS — E1165 Type 2 diabetes mellitus with hyperglycemia: Secondary | ICD-10-CM | POA: Diagnosis not present

## 2018-10-08 DIAGNOSIS — Z7984 Long term (current) use of oral hypoglycemic drugs: Secondary | ICD-10-CM | POA: Insufficient documentation

## 2018-10-08 DIAGNOSIS — Z87891 Personal history of nicotine dependence: Secondary | ICD-10-CM | POA: Diagnosis not present

## 2018-10-08 DIAGNOSIS — F329 Major depressive disorder, single episode, unspecified: Secondary | ICD-10-CM | POA: Insufficient documentation

## 2018-10-08 DIAGNOSIS — R569 Unspecified convulsions: Secondary | ICD-10-CM | POA: Diagnosis not present

## 2018-10-08 DIAGNOSIS — I2699 Other pulmonary embolism without acute cor pulmonale: Secondary | ICD-10-CM | POA: Diagnosis not present

## 2018-10-08 DIAGNOSIS — I10 Essential (primary) hypertension: Secondary | ICD-10-CM | POA: Diagnosis not present

## 2018-10-08 DIAGNOSIS — Z79899 Other long term (current) drug therapy: Secondary | ICD-10-CM | POA: Insufficient documentation

## 2018-10-08 DIAGNOSIS — Z91010 Allergy to peanuts: Secondary | ICD-10-CM | POA: Diagnosis not present

## 2018-10-08 LAB — BASIC METABOLIC PANEL
Anion gap: 8 (ref 5–15)
BUN: 14 mg/dL (ref 8–23)
CO2: 21 mmol/L — ABNORMAL LOW (ref 22–32)
Calcium: 9.3 mg/dL (ref 8.9–10.3)
Chloride: 105 mmol/L (ref 98–111)
Creatinine, Ser: 1.14 mg/dL (ref 0.61–1.24)
GFR calc Af Amer: >60 mL/min (ref >60)
GFR calc non Af Amer: >60 mL/min (ref >60)
Glucose, Bld: 102 mg/dL — ABNORMAL HIGH (ref 70–99)
Potassium: 4.3 mmol/L (ref 3.5–5.1)
Sodium: 134 mmol/L — ABNORMAL LOW (ref 135–145)

## 2018-10-08 LAB — BRAIN NATRIURETIC PEPTIDE: B Natriuretic Peptide: 177.2 pg/mL — ABNORMAL HIGH (ref 0.0–100.0)

## 2018-10-08 MED ORDER — CARVEDILOL 12.5 MG PO TABS: 18.7500 mg | ORAL_TABLET | Freq: Two times a day (BID) | ORAL | 6 refills | Status: DC

## 2018-10-08 MED ORDER — SACUBITRIL-VALSARTAN 97-103 MG PO TABS: 1.0000 | ORAL_TABLET | Freq: Two times a day (BID) | ORAL | 6 refills | Status: DC

## 2018-10-08 NOTE — Patient Instructions (Signed)
INCREASE Coreg to 18.75mg  (1.5 tab) twice a day  INCREASE Entresto to 97/103mg  (1 tab) twice a day  Labs today We will only contact you if something comes back abnormal or we need to make some changes. Otherwise no news is good news!  Your physician recommends that you schedule a follow-up appointment in: 3 months with Dr. Haroldine Laws and an echo. Office will call you to schedule your appointment.   Your physician has requested that you have an echocardiogram. Echocardiography is a painless test that uses sound waves to create images of your heart. It provides your doctor with information about the size and shape of your heart and how well your heart's chambers and valves are working. This procedure takes approximately one hour. There are no restrictions for this procedure.

## 2018-10-08 NOTE — Addendum Note (Signed)
Encounter addended by: Valeda Malm, RN on: 10/08/2018 9:52 AM  Actions taken: Pharmacy for encounter modified, Diagnosis association updated, Order list changed, Charge Capture section accepted, Clinical Note Signed

## 2018-10-08 NOTE — Progress Notes (Signed)
[facility] Referring Physician: Dr [name]  Primary Care: Dr Wynetta Emery at [facility]  Primary Cardiologist: Dr [name] EP: Dr Curt Bears HF: Dr Haroldine Laws  HPI: Jonathan Dickson is a 63 y.o. male with a HTN, previous polysubstance abuse - including heroin (quit 2004) and depression, heart failure, TTR cardiac amyloidosis, and PE.    Had Myoview in 5/17 with EF 22%. Subsequently underwent cath 6/17. EF 40-45%. Normal coronaries   Admitted to [facility] 8/19 with increased dyspnea. Diuresed with IV lasix. CTA performed and showed bilateral PE. Had AKI so diuretics held.  Placed on eliquis for PE.    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  He returns today for regular follow up. Last visit spironolactone added. Feels good. Only real complaint is that he developed an ab wall hernia and seeing [facility] for it later this month. 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 No edema, orthopnea or PND. Can go up steps. Gets SOb if walks too fast or too far. Taking lasix every day. No dizziness. Now on tafamadis. Tolerating well. On eliquis without bleeding   Cardiac Testing CTA 05/12/2018 --.large bilateral  PE   CPX 04/03/18 FVC 2.77 (83%)    FEV1 2.37 (91%)     FEV1/FVC 86 (109%)     MVV 76 (61%)  Resting HR: 83 Peak HR: 125  (80% age predicted max HR) BP rest: 144/84 BP peak: 168/72 Peak VO2: 23.1 (76% predicted peak VO2) VE/VCO2 slope: 41 OUES: 2.18 Peak RER: 0.93 VE/MVV: 88% O2pulse: 14  (100% predicted O2pulse)  PYP scan obtained which was positive (Visual grade 2. Quantitative 1.57).  cMRI 04/01/18 1. Mild LVE with mild LVH septal thickness 13 mm. Diffuse hypokinesis worse in the septum apex and lateral walls EF 28% 2. Abnormal post gadolinium inversion recovery sequences with failure to null and uptake in the mid/subepicardial septum, inferior wall and lateral walls with some apical sparing Findings consistent with amyloidosis or infiltrative cardiomyopathy  Echo  8/12/19LVEF ~30% (continues to drop in setting of TTR amyloid). RV normal without evidence of acute strain. LE u/s without DVT.  ECHO 12/2017   Left ventricle: EF 30% to 35% (I felt closer to 40-45%). Mild LVH Diffuse hypokinesis. Doppler   parameters are consistent with abnormal left ventricular   relaxation (grade 1 diastolic dysfunction).  ECHO EF 08/2017  Left ventricle: EF 50% to 55%.   Review of systems complete and found to be negative unless listed in HPI.   Past Medical History:  Diagnosis Date  . Abnormal TSH 02/2016  . Anxiety   . Cervical disc disease   . Chronic combined systolic and diastolic CHF (congestive heart failure) ([place])    a. 01/2016 Echo: EF 25%, inf AK, diffuse sev HK, Gr1 DD, mild MR.  . Depression   . Hyperglycemia   . Hypertension   . Hypertensive heart disease   . Myocardial infarction [facility])    ??  maybe  . NICM (nonischemic cardiomyopathy) ([place])    a. 01/2016 Echo: EF 25%, inf AK, diffuse sev HK, Gr1 DD, mild MR; b. 01/2016 MV: EF 22%, no isch/infarct;  c. 02/2016 Cath: Nl cors, EF 35-45%.  . Pre-diabetes   . Seizures ([place])    with penicillin    Current Outpatient Medications  Medication Sig Dispense Refill  . acetaminophen (TYLENOL) 500 MG tablet Take 500 mg by mouth every 6 (six) hours as needed.    Marland Kitchen amitriptyline (ELAVIL) 25 MG tablet Take 1 tablet (25 mg total)  by mouth at bedtime as needed for sleep. 30 tablet 3  . apixaban (ELIQUIS) 5 MG TABS tablet Take 1 tablet (5 mg total) by mouth 2 (two) times daily. 60 tablet 6  . carvedilol (COREG) 12.5 MG tablet Take 1 tablet (12.5 mg total) by mouth 2 (two) times daily with a meal. 60 tablet 6  . cholecalciferol (VITAMIN D) 1000 units tablet Take 1,000 Units by mouth daily.    Marland Kitchen ENTRESTO 49-51 MG TAKE 1 TABLET BY MOUTH TWICE DAILY (Patient taking differently: Take 1 tablet by mouth 2 (two) times daily. ) 60 tablet 3  . furosemide (LASIX) 40 MG tablet Take 1 tablet (40 mg total) by mouth daily as needed  for fluid (if weight gain more than 3 punds). (Patient taking differently: Take 40 mg by mouth daily. ) 30 tablet 0  . hydrALAZINE (APRESOLINE) 25 MG tablet Take 1 tablet (25 mg total) by mouth 3 (three) times daily. 90 tablet 11  . metFORMIN (GLUCOPHAGE-XR) 500 MG 24 hr tablet TAKE 1 TABLET(500 MG) BY MOUTH DAILY WITH BREAKFAST (Patient taking differently: Take 500 mg by mouth daily with breakfast. ) 30 tablet 2  . spironolactone (ALDACTONE) 25 MG tablet Take 0.5 tablets (12.5 mg total) by mouth daily. 15 tablet 11  . Tafamidis Meglumine, Cardiac, (VYNDAQEL) 20 MG CAPS Take 80 mg by mouth daily.     Marland Kitchen VIAGRA 25 MG tablet TAKE 1 TABLET BY MOUTH EVERY DAIY AS NEEDED FOR ERECTILE DYSFUNCTION (Patient taking differently: Take 25 mg by mouth as needed for erectile dysfunction. ) 10 tablet 2   No current facility-administered medications for this encounter.     Allergies  Allergen Reactions  . Peanut-Containing Drug Products Swelling  . Penicillins Other (See Comments)    CONVULSIONS Has patient had a PCN reaction causing immediate rash, facial/tongue/throat swelling, SOB or lightheadedness with hypotension: No Has patient had a PCN reaction causing severe rash involving mucus membranes or skin necrosis: No Has patient had a PCN reaction that required hospitalization No Has patient had a PCN reaction occurring within the last 10 years: No If all of the above answers are "NO", then may proceed with Cephalosporin use.   . Decadron [Dexamethasone] Itching      Social History   Socioeconomic History  . Marital status: Legally Separated    Spouse name: Not on file  . Number of children: 3  . Years of education: Not on file  . Highest education level: Not on file  Occupational History  . Not on file  Social Needs  . Financial resource strain: Not on file  . Food insecurity:    Worry: Not on file    Inability: Not on file  . Transportation needs:    Medical: Not on file    Non-medical:  Not on file  Tobacco Use  . Smoking status: Former Smoker    Packs/day: 1.50    Years: 8.00    Pack years: 12.00    Last attempt to quit: 03/15/2003    Years since quitting: 15.5  . Smokeless tobacco: Never Used  Substance and Sexual Activity  . Alcohol use: No    Comment: none since 2004  . Drug use: No    Comment: former  none since 2004  . Sexual activity: Not Currently  Lifestyle  . Physical activity:    Days per week: Not on file    Minutes per session: Not on file  . Stress: Not on file  Relationships  .  Social connections:    Talks on phone: Not on file    Gets together: Not on file    Attends religious service: Not on file    Active member of club or organization: Not on file    Attends meetings of clubs or organizations: Not on file    Relationship status: Not on file  . Intimate partner violence:    Fear of current or ex partner: Not on file    Emotionally abused: Not on file    Physically abused: Not on file    Forced sexual activity: Not on file  Other Topics Concern  . Not on file  Social History Narrative  . Not on file      Family History  Problem Relation Age of Onset  . Diabetes Mother   . Diabetes Sister     Vitals:   10/08/18 0915 10/08/18 0918  BP: (!) 160/82 (!) 136/92  Pulse: (!) 107   SpO2: 98%   Weight: 80.6 kg (177 lb 9.6 oz)     Wt Readings from Last 3 Encounters:  10/08/18 80.6 kg (177 lb 9.6 oz)  09/29/18 78.5 kg (173 lb)  08/07/18 77.2 kg (170 lb 4.8 oz)    PHYSICAL EXAM: General:  Well appearing. No resp difficulty HEENT: normal Neck: supple. no JVD. Carotids 2+ bilat; no bruits. No lymphadenopathy or thryomegaly appreciated. Cor: PMI nondisplaced. Tachy regular  No rubs, gallops or murmurs. Lungs: clear Abdomen: soft, nontender, nondistended. No hepatosplenomegaly. No bruits or masses. Good bowel sounds. Extremities: no cyanosis, clubbing, rash, edema Neuro: alert & orientedx3, cranial nerves grossly intact. moves all 4  extremities w/o difficulty. Affect pleasant   ASSESSMENT & PLAN: 1. Chronic Systolic Heart Failure, [facility]. Normal LHC 2017. 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 No M-spike, normal IFE on myeloma panel. Tafamidis has been sent into specialty pharmacy 05/14/18. Pt has not heard anything. Will have our pharmacist check on this. - CPX 5/19 pVO2 23 but slope 41 - S/p St Jude ICD implant 06/17/18 - Improved NYHA II  - Volume status looks good.  - Continue lasix 40 mg daily  - Increase carvedilol to 12.5 mg twice a day.  - Increase Entresto 97-103 mg twice a day   - Continue spiro 12.5 mg daily. BMET today - Continue tafamadis - Eventually will need Jardiance and Bidil  - Repeat echo at next visit in 3 months - ICD interrogated personally in Clinic no VT/AF. Volume ok   2.DMII - Stable On metformin - Consider JArdiance  3. HTN - Elevated today. Med changes as above  4. PE CTA 04/2018 with large bilateral PE - Continue Eliquis 5 mg twice a day. Denies bleeding. - After 1 year will decrease to 2.5 bid   5. AKI - Recent hsoptalization creatinine peaked at 1.7. - Creatinine 1.2 on 8/22. - Recheck today   [name], MD  9:29 AM

## 2018-10-12 ENCOUNTER — Encounter (HOSPITAL_COMMUNITY): Payer: Self-pay

## 2018-10-12 ENCOUNTER — Emergency Department (HOSPITAL_COMMUNITY): Payer: Medicaid Other

## 2018-10-12 ENCOUNTER — Emergency Department (HOSPITAL_COMMUNITY)
Admission: EM | Admit: 2018-10-12 | Discharge: 2018-10-12 | Disposition: A | Discharge status: Home / Self Care | Payer: Medicaid Other | Attending: Emergency Medicine | Admitting: Emergency Medicine

## 2018-10-12 DIAGNOSIS — Z79899 Other long term (current) drug therapy: Secondary | ICD-10-CM | POA: Diagnosis not present

## 2018-10-12 DIAGNOSIS — R05 Cough: Secondary | ICD-10-CM | POA: Insufficient documentation

## 2018-10-12 DIAGNOSIS — Z87891 Personal history of nicotine dependence: Secondary | ICD-10-CM | POA: Diagnosis not present

## 2018-10-12 DIAGNOSIS — R059 Cough, unspecified: Secondary | ICD-10-CM

## 2018-10-12 DIAGNOSIS — I5042 Chronic combined systolic (congestive) and diastolic (congestive) heart failure: Secondary | ICD-10-CM | POA: Diagnosis not present

## 2018-10-12 DIAGNOSIS — I110 Hypertensive heart disease with heart failure: Secondary | ICD-10-CM | POA: Insufficient documentation

## 2018-10-12 DIAGNOSIS — Z91010 Allergy to peanuts: Secondary | ICD-10-CM | POA: Diagnosis not present

## 2018-10-12 LAB — CBC WITH DIFFERENTIAL/PLATELET
Abs Immature Granulocytes: 0.05 10*3/uL (ref 0.00–0.07)
Basophils Absolute: 0.1 10*3/uL (ref 0.0–0.1)
Basophils Relative: 0 %
EOS PCT: 2 %
Eosinophils Absolute: 0.2 10*3/uL (ref 0.0–0.5)
HCT: 44.3 % (ref 39.0–52.0)
Hemoglobin: 14.7 g/dL (ref 13.0–17.0)
Immature Granulocytes: 0 %
LYMPHS PCT: 21 %
Lymphs Abs: 2.4 10*3/uL (ref 0.7–4.0)
MCH: 28.7 pg (ref 26.0–34.0)
MCHC: 33.2 g/dL (ref 30.0–36.0)
MCV: 86.4 fL (ref 80.0–100.0)
Monocytes Absolute: 1.3 10*3/uL — ABNORMAL HIGH (ref 0.1–1.0)
Monocytes Relative: 11 %
Neutro Abs: 7.5 10*3/uL (ref 1.7–7.7)
Neutrophils Relative %: 66 %
Platelets: 241 10*3/uL (ref 150–400)
RBC: 5.13 MIL/uL (ref 4.22–5.81)
RDW: 13.9 % (ref 11.5–15.5)
WBC: 11.5 10*3/uL — AB (ref 4.0–10.5)
nRBC: 0 % (ref 0.0–0.2)

## 2018-10-12 LAB — BASIC METABOLIC PANEL
Anion gap: 11 (ref 5–15)
BUN: 14 mg/dL (ref 8–23)
CO2: 23 mmol/L (ref 22–32)
Calcium: 9 mg/dL (ref 8.9–10.3)
Chloride: 100 mmol/L (ref 98–111)
Creatinine, Ser: 1.76 mg/dL — ABNORMAL HIGH (ref 0.61–1.24)
GFR calc Af Amer: 47 mL/min — ABNORMAL LOW (ref >60)
GFR calc non Af Amer: 40 mL/min — ABNORMAL LOW (ref >60)
Glucose, Bld: 109 mg/dL — ABNORMAL HIGH (ref 70–99)
Potassium: 4.4 mmol/L (ref 3.5–5.1)
Sodium: 134 mmol/L — ABNORMAL LOW (ref 135–145)

## 2018-10-12 LAB — BRAIN NATRIURETIC PEPTIDE: B Natriuretic Peptide: 430.7 pg/mL — ABNORMAL HIGH (ref 0.0–100.0)

## 2018-10-12 LAB — I-STAT TROPONIN, ED: Troponin i, poc: 0.01 ng/mL (ref 0.00–0.08)

## 2018-10-12 LAB — INFLUENZA PANEL BY PCR (TYPE A & B)
INFLAPCR: NEGATIVE
Influenza B By PCR: NEGATIVE

## 2018-10-12 LAB — I-STAT CG4 LACTIC ACID, ED: Lactic Acid, Venous: 1.72 mmol/L (ref 0.5–1.9)

## 2018-10-12 MED ORDER — AZITHROMYCIN 250 MG PO TABS: 250.0000 mg | ORAL_TABLET | Freq: Every day | ORAL | 0 refills | Status: DC

## 2018-10-12 MED ORDER — BENZONATATE 100 MG PO CAPS: 100.0000 mg | ORAL_CAPSULE | Freq: Three times a day (TID) | ORAL | 0 refills | Status: DC

## 2018-10-12 NOTE — ED Triage Notes (Signed)
Pt from home c/o nonproductive cough for a couple of weeks that has worsened; pt has a hernia that he feels is being pushed out when he coughs; denies sob, CP; pt diaphoretic; hx 2 MI, PE, implanted defibrillator x 3 months; pt on eliquis; pt took all home meds this AM

## 2018-10-12 NOTE — ED Notes (Signed)
Patient verbalizes understanding of discharge instructions. Opportunity for questioning and answers were provided. Pt discharged from ED. 

## 2018-10-12 NOTE — ED Provider Notes (Signed)
[facility] EMERGENCY DEPARTMENT Provider Note   CSN: [id number] Arrival date & time: 10/12/18  1194     History   Chief Complaint No chief complaint on file.   HPI Jonathan Dickson is a 63 y.o. male.  63 year old male with prior medical history as detailed below presents for evaluation of cough.  Patient reports persistent cough over the last 4 to 5 days.  He denies associated fever.  He denies chest pain or shortness of breath.  Cough is persistent and bothersome.  He has not taken anything for his symptoms.  Patient reports full compliance with his regular medications including his diuretics.  The history is provided by the patient and medical records.  Cough  Cough characteristics:  Dry Sputum characteristics:  Nondescript Severity:  Mild Onset quality:  Gradual Duration:  5 days Timing:  Constant Progression:  Waxing and waning Chronicity:  New Relieved by:  Nothing Worsened by:  Nothing Associated symptoms: no fever and no shortness of breath     Past Medical History:  Diagnosis Date  . Abnormal TSH 02/2016  . Anxiety   . Cervical disc disease   . Chronic combined systolic and diastolic CHF (congestive heart failure) ([place])    a. 01/2016 Echo: EF 25%, inf AK, diffuse sev HK, Gr1 DD, mild MR.  . Depression   . Hyperglycemia   . Hypertension   . Hypertensive heart disease   . Myocardial infarction [facility])    ??  maybe  . NICM (nonischemic cardiomyopathy) ([place])    a. 01/2016 Echo: EF 25%, inf AK, diffuse sev HK, Gr1 DD, mild MR; b. 01/2016 MV: EF 22%, no isch/infarct;  c. 02/2016 Cath: Nl cors, EF 35-45%.  . Pre-diabetes   . Seizures ([place])    with penicillin    Patient Active Problem List   Diagnosis Date Noted  . Prediabetes 07/21/2018  . Cardiac amyloidosis ([place]) 07/21/2018  . Chronic systolic heart failure ([place]) 06/17/2018  . Bilateral pulmonary embolism ([place]) 05/12/2018  . Heart failure ([place]) 05/11/2018  . Claudication in peripheral  vascular disease ([place]) 12/31/2017  . Decreased hearing of both ears 11/10/2017  . Cervical disc disease with myelopathy 07/10/2016  . NICM (nonischemic cardiomyopathy) ([place]) 03/12/2016  . Congestive dilated cardiomyopathy ([place]) 03/11/2016  . Cervical radiculitis 04/19/2015  . Essential hypertension 09/27/2014    Past Surgical History:  Procedure Laterality Date  . ANTERIOR CERVICAL DECOMP/DISCECTOMY FUSION N/A 07/10/2016   Procedure: ANTERIOR CERVICAL DECOMPRESSION FUSION CERVICAL 4-5, CERVICAL 5-6, CERVICAL 6-7 WITH INSTRUMENTATION AND ALLOGRAFT;  Surgeon: [name], MD;  Location: [facility];  Service: Orthopedics;  Laterality: N/A;  . CARDIAC CATHETERIZATION N/A 03/11/2016   Procedure: Left Heart Cath and Coronary Angiography;  Surgeon: [name], MD;  Location: [place] CV LAB;  Service: Cardiovascular;  Laterality: N/A;  . ICD IMPLANT N/A 06/17/2018   Procedure: ICD IMPLANT;  Surgeon: [name], MD;  Location: [place] CV LAB;  Service: Cardiovascular;  Laterality: N/A;  . Thumb surgery Right         Home Medications    Prior to Admission medications   Medication Sig Start Date End Date Taking? Authorizing Provider  acetaminophen (TYLENOL) 500 MG tablet Take 500 mg by mouth every 6 (six) hours as needed for mild pain.    Yes [provider]  amitriptyline (ELAVIL) 25 MG tablet Take 1 tablet (25 mg total) by mouth at bedtime as needed for sleep. 07/21/18  Yes Ladell Pier, MD  apixaban (ELIQUIS) 5 MG TABS tablet Take 1 tablet (5 mg total) by mouth 2 (two) times daily. 06/15/18  Yes Bensimhon, Shaune Pascal, MD  carvedilol (COREG) 12.5 MG tablet Take 1.5 tablets (18.75 mg total) by mouth 2 (two) times daily with a meal. 10/08/18  Yes Bensimhon, Shaune Pascal, MD  cholecalciferol (VITAMIN D) 1000 units tablet Take 1,000 Units by mouth daily.   Yes [provider]  furosemide (LASIX) 40 MG tablet Take 1 tablet (40 mg total) by mouth daily as needed for  fluid (if weight gain more than 3 punds). Patient taking differently: Take 40 mg by mouth daily.  05/14/18 05/14/19 Yes Ghimire, Henreitta Leber, MD  hydrALAZINE (APRESOLINE) 25 MG tablet Take 1 tablet (25 mg total) by mouth 3 (three) times daily. 06/22/18  Yes Georgiana Shore, NP  metFORMIN (GLUCOPHAGE-XR) 500 MG 24 hr tablet TAKE 1 TABLET(500 MG) BY MOUTH DAILY WITH BREAKFAST Patient taking differently: Take 500 mg by mouth daily with breakfast.  07/24/18  Yes Ladell Pier, MD  sacubitril-valsartan (ENTRESTO) 97-103 MG Take 1 tablet by mouth 2 (two) times daily. 10/08/18  Yes Bensimhon, Shaune Pascal, MD  spironolactone (ALDACTONE) 25 MG tablet Take 0.5 tablets (12.5 mg total) by mouth daily. 06/22/18  Yes Georgiana Shore, NP  Tafamidis Meglumine, Cardiac, (VYNDAQEL) 20 MG CAPS Take 80 mg by mouth daily.    Yes [provider]  VIAGRA 25 MG tablet TAKE 1 TABLET BY MOUTH EVERY DAIY AS NEEDED FOR ERECTILE DYSFUNCTION Patient taking differently: Take 25 mg by mouth as needed for erectile dysfunction.  09/29/18  Yes Ladell Pier, MD  azithromycin (ZITHROMAX) 250 MG tablet Take 1 tablet (250 mg total) by mouth daily. Take first 2 tablets together, then 1 every day until finished. 10/12/18   Valarie Merino, MD  benzonatate (TESSALON) 100 MG capsule Take 1 capsule (100 mg total) by mouth every 8 (eight) hours. 10/12/18   Valarie Merino, MD    Family History Family History  Problem Relation Age of Onset  . Diabetes Mother   . Diabetes Sister     Social History Social History   Tobacco Use  . Smoking status: Former Smoker    Packs/day: 1.50    Years: 8.00    Pack years: 12.00    Last attempt to quit: 03/15/2003    Years since quitting: 15.5  . Smokeless tobacco: Never Used  Substance Use Topics  . Alcohol use: No    Comment: none since 2004  . Drug use: No    Comment: former  none since 2004     Allergies   Peanut-containing drug products; Penicillins; and Decadron  [dexamethasone]   Review of Systems Review of Systems  Constitutional: Negative for fever.  Respiratory: Positive for cough. Negative for shortness of breath.   All other systems reviewed and are negative.    Physical Exam Updated Vital Signs BP 115/77   Pulse 95   Temp 97.8 F (36.6 C) (Oral)   Resp (!) 24   Ht 5\' 6"  (1.676 m)   Wt 77.1 kg   SpO2 97%   BMI 27.44 kg/m   Physical Exam Vitals signs and nursing note reviewed.  Constitutional:      General: He is not in acute distress.    Appearance: Normal appearance. He is well-developed.  HENT:     Head: Normocephalic and atraumatic.     Nose: Nose normal.     Mouth/Throat:     Mouth: Mucous  membranes are moist.  Eyes:     Conjunctiva/sclera: Conjunctivae normal.     Pupils: Pupils are equal, round, and reactive to light.  Neck:     Musculoskeletal: Normal range of motion and neck supple.  Cardiovascular:     Rate and Rhythm: Normal rate and regular rhythm.     Heart sounds: Normal heart sounds.  Pulmonary:     Effort: Pulmonary effort is normal. No respiratory distress.     Breath sounds: Normal breath sounds.  Abdominal:     General: There is no distension.     Palpations: Abdomen is soft.     Tenderness: There is no abdominal tenderness.  Musculoskeletal: Normal range of motion.        General: No deformity.  Skin:    General: Skin is warm and dry.  Neurological:     General: No focal deficit present.     Mental Status: He is alert and oriented to person, place, and time. Mental status is at baseline.      ED Treatments / Results  Labs (all labs ordered are listed, but only abnormal results are displayed) Labs Reviewed  BASIC METABOLIC PANEL - Abnormal; Notable for the following components:      Result Value   Sodium 134 (*)    Glucose, Bld 109 (*)    Creatinine, Ser 1.76 (*)    GFR calc non Af Amer 40 (*)    GFR calc Af Amer 47 (*)    All other components within normal limits  CBC WITH  DIFFERENTIAL/PLATELET - Abnormal; Notable for the following components:   WBC 11.5 (*)    Monocytes Absolute 1.3 (*)    All other components within normal limits  BRAIN NATRIURETIC PEPTIDE - Abnormal; Notable for the following components:   B Natriuretic Peptide 430.7 (*)    All other components within normal limits  INFLUENZA PANEL BY PCR (TYPE A & B)  I-STAT TROPONIN, ED  I-STAT CG4 LACTIC ACID, ED  I-STAT CG4 LACTIC ACID, ED    EKG None  Radiology Dg Chest Port 1 View  Result Date: 10/12/2018 CLINICAL DATA:  Cough for 2 days EXAM: PORTABLE CHEST 1 VIEW COMPARISON:  06/18/2018 FINDINGS: Cardiac shadow is enlarged but stable. Defibrillator is again noted. Postsurgical changes in the cervical spine are seen. Lungs are well aerated bilaterally without focal infiltrate or sizable effusion. No bony abnormality is noted. IMPRESSION: No acute abnormality noted. Electronically Signed   By: Inez Catalina M.D.   On: 10/12/2018 11:48    Procedures Procedures (including critical care time)  Medications Ordered in ED Medications - No data to display   Initial Impression / Assessment and Plan / ED Course  I have reviewed the triage vital signs and the nursing notes.  Pertinent labs & imaging results that were available during my care of the patient were reviewed by me and considered in my medical decision making (see chart for details).     MDM  Screen complete  Patient is presenting for evaluation of cough.  Patient symptoms are gradually progressive over the last week.  Patient without overt evidence of significant acute pathology.  Chest x-ray is clear.  Baseline labs are without significant abnormality.  Patient's describe symptoms are not consistent with cardiac ischemia.  There is no overt evidence of fluid overload.  I cannot exclude possible atypical infection.  Patient will be covered with azithromycin.  He also request a prescription for his cough itself.  He desires  discharge  home.  He declines further work-up or observation in the ED.  He is aware of the need for close follow-up.  Strict return precautions given and understood.  Final Clinical Impressions(s) / ED Diagnoses   Final diagnoses:  Cough    ED Discharge Orders         Ordered    azithromycin (ZITHROMAX) 250 MG tablet  Daily     10/12/18 1312    benzonatate (TESSALON) 100 MG capsule  Every 8 hours     10/12/18 1312           Valarie Merino, MD 10/12/18 1317

## 2018-10-12 NOTE — Discharge Instructions (Signed)
Return for any problem.  Follow-up with your regular care provider as instructed.    If symptoms worsen -- or if you fail -- to improve absolutely feel free to return to the emergency department for repeat evaluation.

## 2018-10-22 ENCOUNTER — Other Ambulatory Visit: Payer: Self-pay | Admitting: Internal Medicine

## 2018-10-22 DIAGNOSIS — E1165 Type 2 diabetes mellitus with hyperglycemia: Secondary | ICD-10-CM

## 2018-11-23 ENCOUNTER — Other Ambulatory Visit: Payer: Self-pay | Admitting: Internal Medicine

## 2018-11-23 DIAGNOSIS — E1165 Type 2 diabetes mellitus with hyperglycemia: Secondary | ICD-10-CM

## 2018-12-02 ENCOUNTER — Telehealth (HOSPITAL_COMMUNITY): Payer: Self-pay | Admitting: *Deleted

## 2018-12-02 NOTE — Telephone Encounter (Signed)
Received fax from [place] pt needs clearance for epigastric hernia under general anesthesia.  Per Dr Haroldine Laws:  "Moderate risk for peri-op CV complications, ok to proceed, hold Eliquis for 48 hours:  Note faxed back to them at [phone] attn: Armen, CMA

## 2018-12-21 ENCOUNTER — Other Ambulatory Visit: Payer: Self-pay | Admitting: Internal Medicine

## 2018-12-21 DIAGNOSIS — G4700 Insomnia, unspecified: Secondary | ICD-10-CM

## 2018-12-21 DIAGNOSIS — E1165 Type 2 diabetes mellitus with hyperglycemia: Secondary | ICD-10-CM

## 2018-12-29 ENCOUNTER — Ambulatory Visit (INDEPENDENT_AMBULATORY_CARE_PROVIDER_SITE_OTHER): Payer: Medicaid Other | Admitting: *Deleted

## 2018-12-29 ENCOUNTER — Other Ambulatory Visit: Payer: Self-pay

## 2018-12-29 ENCOUNTER — Telehealth: Payer: Self-pay

## 2018-12-29 DIAGNOSIS — I428 Other cardiomyopathies: Secondary | ICD-10-CM

## 2018-12-29 DIAGNOSIS — I10 Essential (primary) hypertension: Secondary | ICD-10-CM

## 2018-12-29 DIAGNOSIS — I5022 Chronic systolic (congestive) heart failure: Secondary | ICD-10-CM

## 2018-12-29 LAB — CUP PACEART REMOTE DEVICE CHECK
Battery Remaining Longevity: 89 mo
Battery Remaining Percentage: 89 %
Date Time Interrogation Session: 20200331060016
HIGH POWER IMPEDANCE MEASURED VALUE: 71 Ohm
HighPow Impedance: 71 Ohm
Implantable Lead Implant Date: 20190918
Implantable Lead Location: 753860
Implantable Lead Model: 7122
Lead Channel Impedance Value: 430 Ohm
Lead Channel Pacing Threshold Amplitude: 0.5 V
Lead Channel Pacing Threshold Pulse Width: 0.5 ms
Lead Channel Setting Pacing Amplitude: 2.5 V
Lead Channel Setting Pacing Pulse Width: 0.5 ms
Lead Channel Setting Sensing Sensitivity: 0.5 mV
MDC IDC MSMT BATTERY VOLTAGE: 3.1 V
MDC IDC MSMT LEADCHNL RV SENSING INTR AMPL: 11.8 mV
MDC IDC PG IMPLANT DT: 20190918
MDC IDC STAT BRADY RV PERCENT PACED: 1 %
Pulse Gen Serial Number: 9796641

## 2018-12-29 MED ORDER — CARVEDILOL 25 MG PO TABS: 25.0000 mg | ORAL_TABLET | Freq: Two times a day (BID) | ORAL | 3 refills | Status: DC

## 2018-12-29 NOTE — Telephone Encounter (Signed)
Patient returning call.

## 2018-12-29 NOTE — Telephone Encounter (Signed)
-----   Message from Will Meredith Leeds, MD sent at 12/29/2018  2:03 PM EDT ----- Abnormal device interrogation reviewed.  Lead parameters and battery status stable.  NSVT noted. Increase coreg to 25 mg BID.

## 2018-12-29 NOTE — Telephone Encounter (Signed)
Attempted to call and phone kept ringing.. no VM will try again tomorrow.

## 2018-12-29 NOTE — Telephone Encounter (Signed)
Pt verbalized understanding of Dr. Curt Bears recommendation to increase his Coreg to 25mg  bid.. will call if he has any problems.. Will send in a new RX to pharmacy.

## 2019-01-06 ENCOUNTER — Encounter: Payer: Self-pay | Admitting: Cardiology

## 2019-01-06 NOTE — Progress Notes (Signed)
Remote ICD transmission.   

## 2019-01-09 NOTE — Telephone Encounter (Signed)
done

## 2019-01-17 ENCOUNTER — Other Ambulatory Visit (HOSPITAL_COMMUNITY): Payer: Self-pay | Admitting: Internal Medicine

## 2019-01-21 ENCOUNTER — Telehealth: Payer: Self-pay | Admitting: Cardiovascular Disease

## 2019-01-21 NOTE — Telephone Encounter (Signed)
Mychart, no smartphone, pre reg complete 01/21/19 AF °

## 2019-01-22 ENCOUNTER — Telehealth: Payer: Self-pay | Admitting: *Deleted

## 2019-01-22 ENCOUNTER — Telehealth: Payer: Self-pay

## 2019-01-22 ENCOUNTER — Telehealth (INDEPENDENT_AMBULATORY_CARE_PROVIDER_SITE_OTHER): Payer: Medicaid Other | Admitting: Cardiovascular Disease

## 2019-01-22 DIAGNOSIS — I5022 Chronic systolic (congestive) heart failure: Secondary | ICD-10-CM | POA: Diagnosis not present

## 2019-01-22 DIAGNOSIS — I2699 Other pulmonary embolism without acute cor pulmonale: Secondary | ICD-10-CM

## 2019-01-22 DIAGNOSIS — I428 Other cardiomyopathies: Secondary | ICD-10-CM

## 2019-01-22 DIAGNOSIS — I43 Cardiomyopathy in diseases classified elsewhere: Secondary | ICD-10-CM

## 2019-01-22 DIAGNOSIS — E854 Organ-limited amyloidosis: Secondary | ICD-10-CM

## 2019-01-22 DIAGNOSIS — I10 Essential (primary) hypertension: Secondary | ICD-10-CM

## 2019-01-22 DIAGNOSIS — I420 Dilated cardiomyopathy: Secondary | ICD-10-CM | POA: Diagnosis not present

## 2019-01-22 MED ORDER — HYDRALAZINE HCL 50 MG PO TABS: 50.0000 mg | ORAL_TABLET | Freq: Three times a day (TID) | ORAL | 3 refills | Status: DC

## 2019-01-22 NOTE — Telephone Encounter (Signed)
Virtual Visit Pre-Appointment Phone Call  "(Name), I am calling you today to discuss your upcoming appointment. We are currently trying to limit exposure to the virus that causes COVID-19 by seeing patients at home rather than in the office."  1. "What is the BEST phone number to call the day of the visit?" - include this in appointment notes  2. Do you have or have access to (through a family member/friend) a smartphone with video capability that we can use for your visit?" a. If yes - list this number in appt notes as cell (if different from BEST phone #) and list the appointment type as a VIDEO visit in appointment notes b. If no - list the appointment type as a PHONE visit in appointment notes  3. Confirm consent - "In the setting of the current Covid19 crisis, you are scheduled for a (phone or video) visit with your provider on (date) at (time).  Just as we do with many in-office visits, in order for you to participate in this visit, we must obtain consent.  If you'd like, I can send this to your mychart (if signed up) or email for you to review.  Otherwise, I can obtain your verbal consent now.  All virtual visits are billed to your insurance company just like a normal visit would be.  By agreeing to a virtual visit, we'd like you to understand that the technology does not allow for your provider to perform an examination, and thus may limit your provider's ability to fully assess your condition. If your provider identifies any concerns that need to be evaluated in person, we will make arrangements to do so.  Finally, though the technology is pretty good, we cannot assure that it will always work on either your or our end, and in the setting of a video visit, we may have to convert it to a phone-only visit.  In either situation, we cannot ensure that we have a secure connection.  Are you willing to proceed?" STAFF: Did the patient verbally acknowledge consent to telehealth visit? Document  YES/NO here: YES  4. Advise patient to be prepared - "Two hours prior to your appointment, go ahead and check your blood pressure, pulse, oxygen saturation, and your weight (if you have the equipment to check those) and write them all down. When your visit starts, your provider will ask you for this information. If you have an Apple Watch or Kardia device, please plan to have heart rate information ready on the day of your appointment. Please have a pen and paper handy nearby the day of the visit as well."  5. Give patient instructions for MyChart download to smartphone OR Doximity/Doxy.me as below if video visit (depending on what platform provider is using)  6. Inform patient they will receive a phone call 15 minutes prior to their appointment time (may be from unknown caller ID) so they should be prepared to answer    TELEPHONE CALL NOTE  Jonathan Dickson has been deemed a candidate for a follow-up tele-health visit to limit community exposure during the Covid-19 pandemic. I spoke with the patient via phone to ensure availability of phone/video source, confirm preferred email & phone number, and discuss instructions and expectations.  I reminded Jonathan Dickson to be prepared with any vital sign and/or heart rhythm information that could potentially be obtained via home monitoring, at the time of his visit. I reminded Jonathan Dickson to expect a phone call prior to  his visit.  Venetia Maxon, CMA 01/22/2019 11:01 AM   INSTRUCTIONS FOR DOWNLOADING THE MYCHART APP TO SMARTPHONE  - The patient must first make sure to have activated MyChart and know their login information - If Apple, go to [employer] and type in MyChart in the search bar and download the app. If Android, ask patient to go to [employer] and type in [place] in the search bar and download the app. The app is free but as with any other app downloads, their phone may require them to verify saved payment information or  Apple/Android password.  - The patient will need to then log into the app with their MyChart username and password, and select [facility] as their healthcare provider to link the account. When it is time for your visit, go to the MyChart app, find appointments, and click Begin Video Visit. Be sure to Select Allow for your device to access the Microphone and Camera for your visit. You will then be connected, and your provider will be with you shortly.  **If they have any issues connecting, or need assistance please contact MyChart service desk [phone])**  **If using a computer, in order to ensure the best quality for their visit they will need to use either of the following Internet Browsers: [employer], or Google Chrome**  IF USING DOXIMITY or DOXY.ME - The patient will receive a link just prior to their visit by text.     FULL LENGTH CONSENT FOR TELE-HEALTH VISIT   I hereby voluntarily request, consent and authorize [facility] and its employed or contracted physicians, physician assistants, nurse practitioners or other licensed health care professionals (the Practitioner), to provide me with telemedicine health care services (the Services") as deemed necessary by the treating Practitioner. I acknowledge and consent to receive the Services by the Practitioner via telemedicine. I understand that the telemedicine visit will involve communicating with the Practitioner through live audiovisual communication technology and the disclosure of certain medical information by electronic transmission. I acknowledge that I have been given the opportunity to request an in-person assessment or other available alternative prior to the telemedicine visit and am voluntarily participating in the telemedicine visit.  I understand that I have the right to withhold or withdraw my consent to the use of telemedicine in the course of my care at any time, without affecting my right to future care  or treatment, and that the Practitioner or I may terminate the telemedicine visit at any time. I understand that I have the right to inspect all information obtained and/or recorded in the course of the telemedicine visit and may receive copies of available information for a reasonable fee.  I understand that some of the potential risks of receiving the Services via telemedicine include:   Delay or interruption in medical evaluation due to technological equipment failure or disruption;  Information transmitted may not be sufficient (e.g. poor resolution of images) to allow for appropriate medical decision making by the Practitioner; and/or   In rare instances, security protocols could fail, causing a breach of personal health information.  Furthermore, I acknowledge that it is my responsibility to provide information about my medical history, conditions and care that is complete and accurate to the best of my ability. I acknowledge that Practitioner's advice, recommendations, and/or decision may be based on factors not within their control, such as incomplete or inaccurate data provided by me or distortions of diagnostic images or specimens that may result from electronic transmissions.  I understand that the practice of medicine is not an exact science and that Practitioner makes no warranties or guarantees regarding treatment outcomes. I acknowledge that I will receive a copy of this consent concurrently upon execution via email to the email address I last provided but may also request a printed copy by calling the office of [facility].    I understand that my insurance will be billed for this visit.   I have read or had this consent read to me.  I understand the contents of this consent, which adequately explains the benefits and risks of the Services being provided via telemedicine.   I have been provided ample opportunity to ask questions regarding this consent and the Services and have had  my questions answered to my satisfaction.  I give my informed consent for the services to be provided through the use of telemedicine in my medical care  By participating in this telemedicine visit I agree to the above.       Cardiac Questionnaire:    Since your last visit or hospitalization:    1. Have you been having new or worsening chest pain? NO   2. Have you been having new or worsening shortness of breath? YES 3. Have you been having new or worsening leg swelling, wt gain, or increase in abdominal girth (pants fitting more tightly)? NO   4. Have you had any passing out spells? NO    *A YES to any of these questions would result in the appointment being kept. *If all the answers to these questions are NO, we should indicate that given the current situation regarding the worldwide coronarvirus pandemic, at the recommendation of the CDC, we are looking to limit gatherings in our waiting area, and thus will reschedule their appointment beyond four weeks from today.   _____________   COVID-19 Pre-Screening Questions:   Do you currently have a fever? NO  Have you recently travelled on a cruise, internationally, or to [place], [place], [place], [place], [place], or [place], [place] [employer])? NO  Have you been in contact with someone that is currently pending confirmation of Covid19 testing or has been confirmed to have the [place] virus?  NO  Are you currently experiencing fatigue or cough? NO

## 2019-01-22 NOTE — Patient Instructions (Signed)
Medication Instructions:  Your physician has recommended you make the following change in your medication:  INCREASE YOUR HYDRALAZINE (APRESOLINE) TO 50 MG BY MOUTH THREE TIMES A DAY  If you need a refill on your cardiac medications before your next appointment, please call your pharmacy.   Lab work: NONE If you have labs (blood work) drawn today and your tests are completely normal, you will receive your results only by: Marland Kitchen MyChart Message (if you have MyChart) OR . A paper copy in the mail If you have any lab test that is abnormal or we need to change your treatment, we will call you to review the results.  Testing/Procedures: NONE  Follow-Up: At [facility], you and your health needs are our priority.  As part of our continuing mission to provide you with exceptional heart care, we have created designated Provider Care Teams.  These Care Teams include your primary Cardiologist (physician) and Advanced Practice Providers (APPs -  Physician Assistants and Nurse Practitioners) who all work together to provide you with the care you need, when you need it. . You will need a follow up appointment in 6 months.  Please call our office 2 months in advance to schedule this appointment.  You may see Dr. [name] or one of the following Advanced Practice Providers on your designated Care Team:   . Kerin Ransom, [place] . Almyra Deforest, PA-C . Fabian Sharp, PA-C . Jory Sims, DNP . Rosaria Ferries, PA-C . Roby Lofts, PA-C . Sande Rives, PA-C  Any Other Special Instructions Will Be Listed Below (If Applicable). KEEP A DAILY BLOOD PRESSURE LOG FOR 30 DAYS THEN FOLLOW UP WITH A CLINICAL PHARMACIST IN THE HYPERTENSION CLINIC. YOU WILL NEED AN APPOINTMENT.

## 2019-01-22 NOTE — Telephone Encounter (Signed)
lmtcb to review avs instructions. Letter including After Visit Summary and any other necessary documents to be mailed to the patient's address on file.

## 2019-01-22 NOTE — Progress Notes (Signed)
Virtual Visit via Telephone Note   This visit type was conducted due to national recommendations for restrictions regarding the COVID-19 Pandemic (e.g. social distancing) in an effort to limit this patient's exposure and mitigate transmission in our community.  Due to his co-morbid illnesses, this patient is at least at moderate risk for complications without adequate follow up.  This format is felt to be most appropriate for this patient at this time.  The patient did not have access to video technology/had technical difficulties with video requiring transitioning to audio format only (telephone).  All issues noted in this document were discussed and addressed.  No physical exam could be performed with this format.  Please refer to the patient's chart for his  consent to telehealth for [facility].   Evaluation Performed:  Follow-up visit  Date:  01/22/2019   ID:  Jonathan Dickson 05-08-1956, MRN [id number]  Patient Location: Home Provider Location: Home  PCP:  Ladell Pier, MD  Cardiologist:  Quay Burow, MD  Electrophysiologist:  [name], MD   Chief Complaint: Nonischemic cardiomyopathy  History of Present Illness:    Jonathan Dickson is a 63 y.o.  fit appearing widowed African-American male father of 30, grandfather to 3 grandchildren who  I last saw him in the office 01/28/2018.Marland Kitchen He was scheduled to have cervical laminectomy today but this was put off because of need for cardiac clearance.His only risk factor is hypertension. Smoking 13 years ago. He's never had a heart attack or stroke. He denies chest pain or shortness of breath. I performed 2-D echocardiography which revealed unexpectedly ejection fraction of 25% with inferior wall akinesis and severe hypokinesia otherwise. A Myoview stress test confirmed the deep diminished ejection fraction but did not show any areas of ischemia or infarction. 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 He underwent  right left heart cath by Dr. Ellyn Hack 03/11/16 confirming this. Ultimately, he is begun on carvedilol and lisinopril. His ejection fraction improved up to 35-40% by 2-D echocardiogram performed 06/21/16. He is completely symptomatic. Based on this I cleared him forfor hiselective cervical laminectomy and mildly elevated risk.  Because of her current symptoms of shortness of breath a repeat echo was performed 01/05/2018 revealing EF of 30%.  I did refer him to Dr. Britta Mccreedy on.  His heart failure medications were optimized.  He did have a cardiac MRI performed 04/01/2018 that showed an EF of 20% with changes consistent with cardiac amyloidosis and he was begun on medications for this.  He also had an ICD implanted by Dr. Curt Bears prophylactically 06/07/2018 and has had no discharges.  He feels clinically improved on his current medications.  He has had bilateral pulmonary emboli as well on Eliquis oral anticoagulation.    The patient does not have symptoms concerning for COVID-19 infection (fever, chills, cough, or new shortness of breath).    Past Medical History:  Diagnosis Date   Abnormal TSH 02/2016   Anxiety    Cervical disc disease    Chronic combined systolic and diastolic CHF (congestive heart failure) ([place])    a. 01/2016 Echo: EF 25%, inf AK, diffuse sev HK, Gr1 DD, mild MR.   Depression    Hyperglycemia    Hypertension    Hypertensive heart disease    Myocardial infarction [facility])    ??  maybe   NICM (nonischemic cardiomyopathy) ([place])    a. 01/2016 Echo: EF 25%, inf AK, diffuse sev HK, Gr1 DD, mild MR; b.  01/2016 MV: EF 22%, no isch/infarct;  c. 02/2016 Cath: Nl cors, EF 35-45%.   Pre-diabetes    Seizures ([place])    with penicillin   Past Surgical History:  Procedure Laterality Date   ANTERIOR CERVICAL DECOMP/DISCECTOMY FUSION N/A 07/10/2016   Procedure: ANTERIOR CERVICAL DECOMPRESSION FUSION CERVICAL 4-5, CERVICAL 5-6, CERVICAL 6-7 WITH INSTRUMENTATION AND ALLOGRAFT;  Surgeon: [name], MD;  Location: [facility];  Service: Orthopedics;  Laterality: N/A;   CARDIAC CATHETERIZATION N/A 03/11/2016   Procedure: Left Heart Cath and Coronary Angiography;  Surgeon: [name], MD;  Location: [place] CV LAB;  Service: Cardiovascular;  Laterality: N/A;   ICD IMPLANT N/A 06/17/2018   Procedure: ICD IMPLANT;  Surgeon: [name], MD;  Location: [facility] CV LAB;  Service: Cardiovascular;  Laterality: N/A;   Thumb surgery Right      Current Meds  Medication Sig   acetaminophen (TYLENOL) 500 MG tablet Take 500 mg by mouth every 6 (six) hours as needed for mild pain.    amitriptyline (ELAVIL) 25 MG tablet TAKE 1 TABLET(25 MG) BY MOUTH AT BEDTIME AS NEEDED FOR SLEEP   benzonatate (TESSALON) 100 MG capsule Take 1 capsule (100 mg total) by mouth every 8 (eight) hours.   carvedilol (COREG) 25 MG tablet Take 1 tablet (25 mg total) by mouth 2 (two) times daily with a meal.   cholecalciferol (VITAMIN D) 1000 units tablet Take 1,000 Units by mouth daily.   ELIQUIS 5 MG TABS tablet TAKE 1 TABLET(5 MG) BY MOUTH TWICE DAILY   furosemide (LASIX) 40 MG tablet Take 1 tablet (40 mg total) by mouth daily as needed for fluid (if weight gain more than 3 punds). (Patient taking differently: Take 40 mg by mouth daily. )   hydrALAZINE (APRESOLINE) 25 MG tablet Take 1 tablet (25 mg total) by mouth 3 (three) times daily.   metFORMIN (GLUCOPHAGE-XR) 500 MG 24 hr tablet TAKE 1 TABLET(500 MG) BY MOUTH DAILY WITH BREAKFAST   sacubitril-valsartan (ENTRESTO) 97-103 MG Take 1 tablet by mouth 2 (two) times daily.   spironolactone (ALDACTONE) 25 MG tablet Take 0.5 tablets (12.5 mg total) by mouth daily.   Tafamidis Meglumine, Cardiac, (VYNDAQEL) 20 MG CAPS Take 80 mg by mouth daily.    VIAGRA 25 MG tablet TAKE 1 TABLET BY MOUTH EVERY DAIY AS NEEDED FOR ERECTILE DYSFUNCTION (Patient taking differently: Take 25 mg by mouth as needed for erectile dysfunction. )   [DISCONTINUED]  azithromycin (ZITHROMAX) 250 MG tablet Take 1 tablet (250 mg total) by mouth daily. Take first 2 tablets together, then 1 every day until finished.     Allergies:   Peanut-containing drug products; Penicillins; and Decadron [dexamethasone]   Social History   Tobacco Use   Smoking status: Former Smoker    Packs/day: 1.50    Years: 8.00    Pack years: 12.00    Last attempt to quit: 03/15/2003    Years since quitting: 15.8   Smokeless tobacco: Never Used  Substance Use Topics   Alcohol use: No    Comment: none since 2004   Drug use: No    Comment: former  none since 2004     Family Hx: The patient's family history includes Diabetes in his mother and sister.  ROS:   Please see the history of present illness.     All other systems reviewed and are negative.   Prior CV studies:   The following studies were reviewed today:  2D echocardiogram, cardiac MRI  Labs/Other Tests and Data Reviewed:    EKG:  No ECG reviewed.  Recent Labs: 05/13/2018: Magnesium 2.3 10/01/2018: ALT 15 10/12/2018: B Natriuretic Peptide 430.7; BUN 14; Creatinine, Ser 1.76; Hemoglobin 14.7; Platelets 241; Potassium 4.4; Sodium 134   Recent Lipid Panel Lab Results  Component Value Date/Time   CHOL 155 10/04/2014 03:08 PM   TRIG 87 10/04/2014 03:08 PM   HDL 54 10/04/2014 03:08 PM   CHOLHDL 2.9 10/04/2014 03:08 PM   LDLCALC 84 10/04/2014 03:08 PM    Wt Readings from Last 3 Encounters:  01/22/19 174 lb (78.9 kg)  10/12/18 170 lb (77.1 kg)  10/08/18 177 lb 9.6 oz (80.6 kg)     Objective:    Vital Signs:  BP (!) 147/104    Pulse 97    Ht 5\' 6"  (1.676 m)    Wt 174 lb (78.9 kg)    BMI 28.08 kg/m    VITAL SIGNS:  reviewed GEN:  no acute distress RESPIRATORY:  normal respiratory effort, symmetric expansion NEURO:  alert and oriented x 3, no obvious focal deficit PSYCH:  normal affect  ASSESSMENT & PLAN:    1. Nonischemic cardiomyopathy- history of nonischemic cardiomyopathy demonstrated by  right left heart cath performed by Dr. Ellyn Hack 03/11/2016.  Most recent assessment of LV function was by cardiac MRI 04/01/2018 revealing EF of 28% and findings consistent with cardiac amyloidosis.  He was begun on medications for this.  He is on carvedilol, Entresto, spironolactone as well.  He is relatively asymptomatic at this time. 2. Essential hypertension- blood pressure measured today by the patient was 151/114 with a pulse of 97 although he said he was rushing around today.  He says normally his blood pressure runs in the 120/96 range.  I am going to increase his hydralazine from 25 to 50 mg p.o. 3 times daily and have this followed up by Cyril Mourning, our pharmacist. 3. Bilateral pulmonary emboli- on Eliquis oral anticoagulation  COVID-19 Education: The signs and symptoms of COVID-19 were discussed with the patient and how to seek care for testing (follow up with PCP or arrange E-visit).  The importance of social distancing was discussed today.  Time:   Today, I have spent 6 minutes with the patient with telehealth technology discussing the above problems.     Medication Adjustments/Labs and Tests Ordered: Current medicines are reviewed at length with the patient today.  Concerns regarding medicines are outlined above.   Tests Ordered: No orders of the defined types were placed in this encounter.   Medication Changes: No orders of the defined types were placed in this encounter.   Disposition:  Follow up in 6 month(s)  Signed, Quay Burow, MD  01/22/2019 2:43 PM    [place]

## 2019-02-11 ENCOUNTER — Telehealth (HOSPITAL_COMMUNITY): Payer: Self-pay

## 2019-02-11 NOTE — Telephone Encounter (Signed)
Received fax from insurance company needing most up to date insurance information.  LM for patient to return call

## 2019-02-11 NOTE — Telephone Encounter (Signed)
Received fax from [employer] needing most up to date insurance information.  Patient returned call stating insurance has not changed and he still has Medicaid.  Called [facility] to advise them of same, they were unable to locate documentation of need for updated insurance information.

## 2019-02-15 ENCOUNTER — Encounter (HOSPITAL_COMMUNITY): Payer: Self-pay | Admitting: Emergency Medicine

## 2019-02-15 ENCOUNTER — Emergency Department (HOSPITAL_COMMUNITY)
Admission: EM | Admit: 2019-02-15 | Discharge: 2019-02-15 | Disposition: A | Discharge status: Home / Self Care | Payer: Medicaid Other | Attending: Emergency Medicine | Admitting: Emergency Medicine

## 2019-02-15 ENCOUNTER — Other Ambulatory Visit: Payer: Self-pay

## 2019-02-15 DIAGNOSIS — S61011A Laceration without foreign body of right thumb without damage to nail, initial encounter: Secondary | ICD-10-CM | POA: Diagnosis not present

## 2019-02-15 DIAGNOSIS — Y999 Unspecified external cause status: Secondary | ICD-10-CM | POA: Diagnosis not present

## 2019-02-15 DIAGNOSIS — W260XXA Contact with knife, initial encounter: Secondary | ICD-10-CM | POA: Insufficient documentation

## 2019-02-15 DIAGNOSIS — Y939 Activity, unspecified: Secondary | ICD-10-CM | POA: Diagnosis not present

## 2019-02-15 DIAGNOSIS — I110 Hypertensive heart disease with heart failure: Secondary | ICD-10-CM | POA: Insufficient documentation

## 2019-02-15 DIAGNOSIS — Z7901 Long term (current) use of anticoagulants: Secondary | ICD-10-CM | POA: Diagnosis not present

## 2019-02-15 DIAGNOSIS — Z91010 Allergy to peanuts: Secondary | ICD-10-CM | POA: Diagnosis not present

## 2019-02-15 DIAGNOSIS — I5042 Chronic combined systolic (congestive) and diastolic (congestive) heart failure: Secondary | ICD-10-CM | POA: Diagnosis not present

## 2019-02-15 DIAGNOSIS — Z79899 Other long term (current) drug therapy: Secondary | ICD-10-CM | POA: Diagnosis not present

## 2019-02-15 DIAGNOSIS — Y929 Unspecified place or not applicable: Secondary | ICD-10-CM | POA: Diagnosis not present

## 2019-02-15 DIAGNOSIS — Z23 Encounter for immunization: Secondary | ICD-10-CM | POA: Insufficient documentation

## 2019-02-15 DIAGNOSIS — S6991XA Unspecified injury of right wrist, hand and finger(s), initial encounter: Secondary | ICD-10-CM | POA: Diagnosis present

## 2019-02-15 MED ORDER — TETANUS-DIPHTH-ACELL PERTUSSIS 5-2.5-18.5 LF-MCG/0.5 IM SUSP
0.5000 mL | Freq: Once | INTRAMUSCULAR | Status: AC
  Administered 2019-02-15: 0.5 mL via INTRAMUSCULAR
  Filled 2019-02-15: qty 0.5

## 2019-02-15 MED ORDER — LIDOCAINE HCL (PF) 1 % IJ SOLN
5.0000 mL | Freq: Once | INTRAMUSCULAR | Status: AC
  Administered 2019-02-15: 15:00:00 5 mL via INTRADERMAL
  Filled 2019-02-15: qty 5

## 2019-02-15 NOTE — ED Triage Notes (Signed)
Pt was using a razor this morning and cut his right thumb. Pt is concerned because he is on blood thinners.

## 2019-02-15 NOTE — Discharge Instructions (Addendum)
WOUND CARE Please have your stitches/staples removed in 7 or sooner if you have concerns. You may do this at any available urgent care or at your primary care doctor's office.  Keep area clean and dry for 24 hours. Do not remove bandage, if applied.  After 24 hours, remove bandage and wash wound gently with mild soap and warm water. Reapply a new bandage after cleaning wound, if directed.  Continue daily cleansing with soap and water until stitches/staples are removed.  Do not apply any ointments or creams to the wound while stitches/staples are in place, as this may cause delayed healing.  Seek medical careif you experience any of the following signs of infection: Swelling, redness, pus drainage, streaking, fever >101.0 F  Seek care if you experience excessive bleeding that does not stop after 15-20 minutes of constant, firm pressure.

## 2019-02-15 NOTE — ED Notes (Signed)
Patient verbalizes understanding of discharge instructions. Opportunity for questioning and answers were provided. Armband removed by staff, pt discharged from ED ambulatory to home.  

## 2019-02-15 NOTE — ED Provider Notes (Signed)
[facility] EMERGENCY DEPARTMENT Provider Note   CSN: [id number] Arrival date & time: 02/15/19  1250    History   Chief Complaint No chief complaint on file.   HPI Jonathan Dickson is a 63 y.o. male.  Who presents emergency department with chief complaint of thumb laceration.  Patient was cutting open packaging with a box cutter when it slipped and hit the dorsal surface of his right thumb.  He has a previous injury from combat and is unable to extend the left thumb at baseline secondary to shrapnel injury which severed the tendon.  He is currently on Eliquis for history of bilateral pulmonary emboli.  He denies changes in sensation.  Bleeding is under control.     HPI  Past Medical History:  Diagnosis Date  . Abnormal TSH 02/2016  . Anxiety   . Cervical disc disease   . Chronic combined systolic and diastolic CHF (congestive heart failure) ([place])    a. 01/2016 Echo: EF 25%, inf AK, diffuse sev HK, Gr1 DD, mild MR.  . Depression   . Hyperglycemia   . Hypertension   . Hypertensive heart disease   . Myocardial infarction [facility])    ??  maybe  . NICM (nonischemic cardiomyopathy) ([place])    a. 01/2016 Echo: EF 25%, inf AK, diffuse sev HK, Gr1 DD, mild MR; b. 01/2016 MV: EF 22%, no isch/infarct;  c. 02/2016 Cath: Nl cors, EF 35-45%.  . Pre-diabetes   . Seizures ([place])    with penicillin    Patient Active Problem List   Diagnosis Date Noted  . Prediabetes 07/21/2018  . 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 Chronic systolic heart failure ([place]) 06/17/2018  . Bilateral pulmonary embolism ([place]) 05/12/2018  . Heart failure ([place]) 05/11/2018  . Claudication in peripheral vascular disease ([place]) 12/31/2017  . Decreased hearing of both ears 11/10/2017  . Cervical disc disease with myelopathy 07/10/2016  . NICM (nonischemic cardiomyopathy) ([place]) 03/12/2016  . Congestive dilated cardiomyopathy ([place]) 03/11/2016  . Cervical radiculitis 04/19/2015  . Essential hypertension  09/27/2014    Past Surgical History:  Procedure Laterality Date  . ANTERIOR CERVICAL DECOMP/DISCECTOMY FUSION N/A 07/10/2016   Procedure: ANTERIOR CERVICAL DECOMPRESSION FUSION CERVICAL 4-5, CERVICAL 5-6, CERVICAL 6-7 WITH INSTRUMENTATION AND ALLOGRAFT;  Surgeon: [name], MD;  Location: [facility];  Service: Orthopedics;  Laterality: N/A;  . CARDIAC CATHETERIZATION N/A 03/11/2016   Procedure: Left Heart Cath and Coronary Angiography;  Surgeon: [name], MD;  Location: [facility] CV LAB;  Service: Cardiovascular;  Laterality: N/A;  . ICD IMPLANT N/A 06/17/2018   Procedure: ICD IMPLANT;  Surgeon: [name], MD;  Location: [place] CV LAB;  Service: Cardiovascular;  Laterality: N/A;  . Thumb surgery Right         Home Medications    Prior to Admission medications   Medication Sig Start Date End Date Taking? Authorizing Provider  acetaminophen (TYLENOL) 500 MG tablet Take 500 mg by mouth every 6 (six) hours as needed for mild pain.     [provider]  amitriptyline (ELAVIL) 25 MG tablet TAKE 1 TABLET(25 MG) BY MOUTH AT BEDTIME AS NEEDED FOR SLEEP 12/21/18   Ladell Pier, MD  benzonatate (TESSALON) 100 MG capsule Take 1 capsule (100 mg total) by mouth every 8 (eight) hours. 10/12/18   Valarie Merino, MD  carvedilol (COREG) 25 MG tablet Take 1 tablet (25 mg total) by mouth 2 (two) times daily with a meal. 12/29/18  Camnitz, Will Hassell Done, MD  cholecalciferol (VITAMIN D) 1000 units tablet Take 1,000 Units by mouth daily.    [provider]  ELIQUIS 5 MG TABS tablet TAKE 1 TABLET(5 MG) BY MOUTH TWICE DAILY 01/18/19   Bensimhon, Shaune Pascal, MD  furosemide (LASIX) 40 MG tablet Take 1 tablet (40 mg total) by mouth daily as needed for fluid (if weight gain more than 3 punds). Patient taking differently: Take 40 mg by mouth daily.  05/14/18 05/14/19  Ghimire, Henreitta Leber, MD  hydrALAZINE (APRESOLINE) 50 MG tablet Take 1 tablet (50 mg total) by mouth 3 (three)  times daily. 01/22/19   Lorretta Harp, MD  metFORMIN (GLUCOPHAGE-XR) 500 MG 24 hr tablet TAKE 1 TABLET(500 MG) BY MOUTH DAILY WITH BREAKFAST 12/21/18   Ladell Pier, MD  sacubitril-valsartan (ENTRESTO) 97-103 MG Take 1 tablet by mouth 2 (two) times daily. 10/08/18   Bensimhon, Shaune Pascal, MD  spironolactone (ALDACTONE) 25 MG tablet Take 0.5 tablets (12.5 mg total) by mouth daily. 06/22/18   Georgiana Shore, NP  Tafamidis Meglumine, Cardiac, (VYNDAQEL) 20 MG CAPS Take 80 mg by mouth daily.     [provider]  VIAGRA 25 MG tablet TAKE 1 TABLET BY MOUTH EVERY DAIY AS NEEDED FOR ERECTILE DYSFUNCTION Patient taking differently: Take 25 mg by mouth as needed for erectile dysfunction.  09/29/18   Ladell Pier, MD    Family History Family History  Problem Relation Age of Onset  . Diabetes Mother   . Diabetes Sister     Social History Social History   Tobacco Use  . Smoking status: Former Smoker    Packs/day: 1.50    Years: 8.00    Pack years: 12.00    Last attempt to quit: 03/15/2003    Years since quitting: 15.9  . Smokeless tobacco: Never Used  Substance Use Topics  . Alcohol use: No    Comment: none since 2004  . Drug use: No    Comment: former  none since 2004     Allergies   Peanut-containing drug products; Penicillins; and Decadron [dexamethasone]   Review of Systems Review of Systems  Skin: Positive for wound.  Neurological: Negative for weakness and numbness.     Physical Exam Updated Vital Signs BP 125/75 (BP Location: Right Arm)   Pulse 98   Temp (!) 97.5 F (36.4 C) (Oral)   Resp 16   Ht 5\' 6"  (1.676 m)   Wt 77.1 kg   SpO2 100%   BMI 27.44 kg/m   Physical Exam Vitals signs and nursing note reviewed.  Constitutional:      General: He is not in acute distress.    Appearance: He is well-developed. He is not diaphoretic.  HENT:     Head: Normocephalic and atraumatic.  Eyes:     General: No scleral icterus.    Conjunctiva/sclera:  Conjunctivae normal.  Neck:     Musculoskeletal: Normal range of motion and neck supple.  Cardiovascular:     Rate and Rhythm: Normal rate and regular rhythm.     Heart sounds: Normal heart sounds.  Pulmonary:     Effort: Pulmonary effort is normal. No respiratory distress.     Breath sounds: Normal breath sounds.  Abdominal:     Palpations: Abdomen is soft.     Tenderness: There is no abdominal tenderness.  Musculoskeletal:     Comments: 2 cm laceration on the dorsum of the right thumb.  Patient unable to flex or extend the  thumb at the PIP.  This is baseline.  Otherwise neurovascularly intact  Skin:    General: Skin is warm and dry.  Neurological:     Mental Status: He is alert.  Psychiatric:        Behavior: Behavior normal.      ED Treatments / Results  Labs (all labs ordered are listed, but only abnormal results are displayed) Labs Reviewed - No data to display  EKG None  Radiology No results found.  Procedures .Marland KitchenLaceration Repair Date/Time: 02/15/2019 3:28 PM Performed by: Margarita Mail, PA-C Authorized by: Margarita Mail, PA-C   Consent:    Consent obtained:  Verbal   Consent given by:  Patient   Risks discussed:  Infection, need for additional repair, pain, poor cosmetic result and poor wound healing   Alternatives discussed:  No treatment and delayed treatment Universal protocol:    Procedure explained and questions answered to patient or proxy's satisfaction: yes     Relevant documents present and verified: yes     Test results available and properly labeled: yes     Imaging studies available: yes     Required blood products, implants, devices, and special equipment available: yes     Site/side marked: yes     Immediately prior to procedure, a time out was called: yes     Patient identity confirmed:  Verbally with patient Anesthesia (see MAR for exact dosages):    Anesthesia method:  Local infiltration Laceration details:    Location:  Finger    Finger location:  R thumb   Length (cm):  2   Depth (mm):  2 Repair type:    Repair type:  Simple Pre-procedure details:    Preparation:  Patient was prepped and draped in usual sterile fashion Exploration:    Wound exploration: wound explored through full range of motion and entire depth of wound probed and visualized     Contaminated: no   Treatment:    Area cleansed with:  Betadine   Amount of cleaning:  Standard   Irrigation solution:  Sterile saline   Irrigation method:  Syringe Skin repair:    Repair method:  Sutures   Suture size:  4-0   Suture material:  Nylon   Suture technique:  Running locked   Number of sutures:  5 Approximation:    Approximation:  Close Post-procedure details:    Dressing:  Sterile dressing   Patient tolerance of procedure:  Tolerated well, no immediate complications   (including critical care time)  Medications Ordered in ED Medications  Tdap (BOOSTRIX) injection 0.5 mL (0.5 mLs Intramuscular Given 02/15/19 1432)  lidocaine (PF) (XYLOCAINE) 1 % injection 5 mL (5 mLs Intradermal Given 02/15/19 1432)     Initial Impression / Assessment and Plan / ED Course  I have reviewed the triage vital signs and the nursing notes.  Pertinent labs & imaging results that were available during my care of the patient were reviewed by me and considered in my medical decision making (see chart for details).        OISIN YOAKUM is a 63 y.o. male who presents to ED for laceration of right thumb. Wound thoroughly cleaned in ED today. Wound explored and bottom of wound seen in a bloodless field. Laceration repaired as dictated above. Patient counseled on home wound care. Follow up with PCP/urgent care or return to ER for suture removal in 7 days. Patient was urged to return to the Emergency Department for worsening pain, swelling, expanding  erythema especially if it streaks away from the affected area, fever, or for any additional concerns. Patient verbalized  understanding. All questions answered.   Final Clinical Impressions(s) / ED Diagnoses   Final diagnoses:  Thumb laceration, right, initial encounter    ED Discharge Orders    None       Margarita Mail, PA-C 02/15/19 [place], [place], DO 02/16/19 [phone]

## 2019-02-22 ENCOUNTER — Other Ambulatory Visit: Payer: Self-pay

## 2019-02-22 ENCOUNTER — Ambulatory Visit (HOSPITAL_COMMUNITY)
Admission: EM | Admit: 2019-02-22 | Discharge: 2019-02-22 | Disposition: A | Discharge status: Home / Self Care | Payer: Medicaid Other

## 2019-02-22 NOTE — ED Triage Notes (Signed)
Pt presents to have 4 sutures removed from right thumb.

## 2019-03-09 ENCOUNTER — Telehealth (HOSPITAL_COMMUNITY): Payer: Self-pay | Admitting: Cardiology

## 2019-03-09 NOTE — Telephone Encounter (Signed)
entresto PA submitted via [place] tracks

## 2019-03-11 ENCOUNTER — Other Ambulatory Visit (HOSPITAL_COMMUNITY): Payer: Self-pay | Admitting: Cardiology

## 2019-03-11 ENCOUNTER — Telehealth (HOSPITAL_COMMUNITY): Payer: Self-pay | Admitting: Vascular Surgery

## 2019-03-11 MED ORDER — SACUBITRIL-VALSARTAN 97-103 MG PO TABS: 1.0000 | ORAL_TABLET | Freq: Two times a day (BID) | ORAL | 6 refills | Status: DC

## 2019-03-11 NOTE — Telephone Encounter (Signed)
Addressed in another encounter

## 2019-03-11 NOTE — Telephone Encounter (Signed)
Pt left VM on scheduling line , pt needs refill on Entresto

## 2019-03-22 ENCOUNTER — Other Ambulatory Visit: Payer: Self-pay | Admitting: Internal Medicine

## 2019-03-22 DIAGNOSIS — G4700 Insomnia, unspecified: Secondary | ICD-10-CM

## 2019-03-22 DIAGNOSIS — E1165 Type 2 diabetes mellitus with hyperglycemia: Secondary | ICD-10-CM

## 2019-03-25 ENCOUNTER — Other Ambulatory Visit: Payer: Self-pay

## 2019-03-25 ENCOUNTER — Ambulatory Visit: Payer: Medicaid Other | Attending: Internal Medicine | Admitting: Physician Assistant

## 2019-03-25 DIAGNOSIS — N529 Male erectile dysfunction, unspecified: Secondary | ICD-10-CM | POA: Diagnosis not present

## 2019-03-25 DIAGNOSIS — E1165 Type 2 diabetes mellitus with hyperglycemia: Secondary | ICD-10-CM

## 2019-03-25 DIAGNOSIS — G4700 Insomnia, unspecified: Secondary | ICD-10-CM | POA: Diagnosis not present

## 2019-03-25 MED ORDER — AMITRIPTYLINE HCL 25 MG PO TABS: ORAL_TABLET | ORAL | 2 refills | Status: DC

## 2019-03-25 MED ORDER — SILDENAFIL CITRATE 50 MG PO TABS: ORAL_TABLET | ORAL | 2 refills | Status: DC

## 2019-03-25 MED ORDER — METFORMIN HCL ER 500 MG PO TB24: ORAL_TABLET | ORAL | 1 refills | Status: DC

## 2019-03-25 MED ORDER — SACUBITRIL-VALSARTAN 97-103 MG PO TABS: 1.0000 | ORAL_TABLET | Freq: Two times a day (BID) | ORAL | 6 refills | Status: DC

## 2019-03-25 NOTE — Progress Notes (Signed)
Virtual Visit via Telephone Note  I connected with Jonathan Dickson on 03/25/19 at  8:30 AM EDT by telephone and verified that I am speaking with the correct person using two identifiers.   I discussed the limitations, risks, security and privacy concerns of performing an evaluation and management service by telephone and the availability of in person appointments. I also discussed with the patient that there may be a patient responsible charge related to this service. The patient expressed understanding and agreed to proceed.  Patient location:  home My Location:  [place] office Persons on the call:  Me and the patient.   History of Present Illness: Patient needs to get RF of metformin, viagra, entresto, and elavil.  No concerns or complaints.  No CP.  No HA.  Blood sugars running <150.     Observations/Objective: A&Ox3   Assessment and Plan: 1. Type 2 diabetes mellitus with hyperglycemia, without long-term current use of insulin (HCC) Continue to watch diet, exercise - metFORMIN (GLUCOPHAGE-XR) 500 MG 24 hr tablet; TAKE 1 TABLET(500 MG) BY MOUTH DAILY WITH BREAKFAST  Dispense: 90 tablet; Refill: 1  2. Erectile dysfunction, unspecified erectile dysfunction type Avoid Nitroglycerin education discussed and patient was already aware - sildenafil (VIAGRA) 50 MG tablet; TAKE 1 TABLET BY MOUTH EVERY DAIY AS NEEDED FOR ERECTILE DYSFUNCTION  Dispense: 10 tablet; Refill: 2  3. Insomnia, unspecified type Controlled on amitryptyline - amitriptyline (ELAVIL) 25 MG tablet; TAKE 1 TABLET(25 MG) BY MOUTH AT BEDTIME AS NEEDED FOR SLEEP  Dispense: 30 tablet; Refill: 2   Follow Up Instructions: 6 weeks with PCP for in person visit with bloodwork   I discussed the assessment and treatment plan with the patient. The patient was provided an opportunity to ask questions and all were answered. The patient agreed with the plan and demonstrated an understanding of the instructions.   The patient was advised  to call back or seek an in-person evaluation if the symptoms worsen or if the condition fails to improve as anticipated.  I provided 7 minutes of non-face-to-face time during this encounter.   Freeman Caldron, PA-C  Patient ID: Jonathan Dickson, male   DOB: Aug 16, 1956, 62 y.o.   MRN: [id number]

## 2019-03-25 NOTE — Progress Notes (Signed)
Patient is asking for medication refill Metformin and Amitriptyline.

## 2019-03-30 ENCOUNTER — Ambulatory Visit (INDEPENDENT_AMBULATORY_CARE_PROVIDER_SITE_OTHER): Payer: Medicaid Other | Admitting: *Deleted

## 2019-03-30 DIAGNOSIS — I420 Dilated cardiomyopathy: Secondary | ICD-10-CM | POA: Diagnosis not present

## 2019-03-30 LAB — CUP PACEART REMOTE DEVICE CHECK
Battery Remaining Longevity: 88 mo
Battery Remaining Percentage: 87 %
Battery Voltage: 3.04 V
Brady Statistic RV Percent Paced: 1 %
Date Time Interrogation Session: 20200630060034
HighPow Impedance: 68 Ohm
HighPow Impedance: 68 Ohm
Implantable Lead Implant Date: 20190918
Implantable Lead Location: 753860
Implantable Lead Model: 7122
Implantable Pulse Generator Implant Date: 20190918
Lead Channel Impedance Value: 450 Ohm
Lead Channel Pacing Threshold Amplitude: 0.5 V
Lead Channel Pacing Threshold Pulse Width: 0.5 ms
Lead Channel Sensing Intrinsic Amplitude: 12 mV
Lead Channel Setting Pacing Amplitude: 2.5 V
Lead Channel Setting Pacing Pulse Width: 0.5 ms
Lead Channel Setting Sensing Sensitivity: 0.5 mV
Pulse Gen Serial Number: 9796641

## 2019-04-07 ENCOUNTER — Telehealth (HOSPITAL_COMMUNITY): Payer: Self-pay

## 2019-04-07 NOTE — Telephone Encounter (Signed)
PT called stating that he received letter from [facility] that they are no longer covering Entresto.  PA submitted on 03/09/19 via [place] tracks without supporting documentation.  PA resubmitted via paper form and last OV note, faxed to [facility] tracks [phone]

## 2019-04-08 NOTE — Telephone Encounter (Signed)
Entresto approved via [place] from 04/07/19-04/01/20. Lm on patient vm with same information.  Advised to call if he has any questions.

## 2019-04-11 ENCOUNTER — Encounter: Payer: Self-pay | Admitting: Cardiology

## 2019-04-11 NOTE — Progress Notes (Signed)
Remote ICD transmission.   

## 2019-05-13 ENCOUNTER — Ambulatory Visit: Payer: Medicaid Other | Attending: Internal Medicine | Admitting: Internal Medicine

## 2019-05-13 ENCOUNTER — Other Ambulatory Visit: Payer: Self-pay

## 2019-05-13 ENCOUNTER — Encounter: Payer: Self-pay | Admitting: Internal Medicine

## 2019-05-13 VITALS — BP 117/74 | HR 88 | Temp 98.2°F | Resp 16 | Ht 66.0 in | Wt 173.4 lb

## 2019-05-13 DIAGNOSIS — L608 Other nail disorders: Secondary | ICD-10-CM | POA: Insufficient documentation

## 2019-05-13 DIAGNOSIS — Z91010 Allergy to peanuts: Secondary | ICD-10-CM | POA: Diagnosis not present

## 2019-05-13 DIAGNOSIS — I10 Essential (primary) hypertension: Secondary | ICD-10-CM | POA: Diagnosis not present

## 2019-05-13 DIAGNOSIS — Z981 Arthrodesis status: Secondary | ICD-10-CM | POA: Diagnosis not present

## 2019-05-13 DIAGNOSIS — Z86711 Personal history of pulmonary embolism: Secondary | ICD-10-CM | POA: Insufficient documentation

## 2019-05-13 DIAGNOSIS — E1165 Type 2 diabetes mellitus with hyperglycemia: Secondary | ICD-10-CM | POA: Diagnosis not present

## 2019-05-13 DIAGNOSIS — E1151 Type 2 diabetes mellitus with diabetic peripheral angiopathy without gangrene: Secondary | ICD-10-CM | POA: Insufficient documentation

## 2019-05-13 DIAGNOSIS — I5022 Chronic systolic (congestive) heart failure: Secondary | ICD-10-CM | POA: Insufficient documentation

## 2019-05-13 DIAGNOSIS — Z87891 Personal history of nicotine dependence: Secondary | ICD-10-CM | POA: Insufficient documentation

## 2019-05-13 DIAGNOSIS — I2699 Other pulmonary embolism without acute cor pulmonale: Secondary | ICD-10-CM | POA: Diagnosis not present

## 2019-05-13 DIAGNOSIS — L853 Xerosis cutis: Secondary | ICD-10-CM

## 2019-05-13 DIAGNOSIS — I420 Dilated cardiomyopathy: Secondary | ICD-10-CM | POA: Insufficient documentation

## 2019-05-13 DIAGNOSIS — M5000 Cervical disc disorder with myelopathy, unspecified cervical region: Secondary | ICD-10-CM | POA: Diagnosis not present

## 2019-05-13 DIAGNOSIS — Z888 Allergy status to other drugs, medicaments and biological substances status: Secondary | ICD-10-CM | POA: Insufficient documentation

## 2019-05-13 DIAGNOSIS — N529 Male erectile dysfunction, unspecified: Secondary | ICD-10-CM | POA: Insufficient documentation

## 2019-05-13 DIAGNOSIS — Z7984 Long term (current) use of oral hypoglycemic drugs: Secondary | ICD-10-CM | POA: Diagnosis not present

## 2019-05-13 DIAGNOSIS — I43 Cardiomyopathy in diseases classified elsewhere: Secondary | ICD-10-CM | POA: Diagnosis not present

## 2019-05-13 DIAGNOSIS — I110 Hypertensive heart disease with heart failure: Secondary | ICD-10-CM | POA: Insufficient documentation

## 2019-05-13 DIAGNOSIS — Z7901 Long term (current) use of anticoagulants: Secondary | ICD-10-CM | POA: Diagnosis not present

## 2019-05-13 DIAGNOSIS — L602 Onychogryphosis: Secondary | ICD-10-CM

## 2019-05-13 DIAGNOSIS — Z833 Family history of diabetes mellitus: Secondary | ICD-10-CM | POA: Insufficient documentation

## 2019-05-13 DIAGNOSIS — Z79899 Other long term (current) drug therapy: Secondary | ICD-10-CM | POA: Insufficient documentation

## 2019-05-13 DIAGNOSIS — E854 Organ-limited amyloidosis: Secondary | ICD-10-CM | POA: Diagnosis not present

## 2019-05-13 DIAGNOSIS — N289 Disorder of kidney and ureter, unspecified: Secondary | ICD-10-CM | POA: Insufficient documentation

## 2019-05-13 DIAGNOSIS — E119 Type 2 diabetes mellitus without complications: Secondary | ICD-10-CM

## 2019-05-13 DIAGNOSIS — K439 Ventral hernia without obstruction or gangrene: Secondary | ICD-10-CM | POA: Diagnosis not present

## 2019-05-13 DIAGNOSIS — L988 Other specified disorders of the skin and subcutaneous tissue: Secondary | ICD-10-CM | POA: Diagnosis not present

## 2019-05-13 DIAGNOSIS — Z880 Allergy status to penicillin: Secondary | ICD-10-CM | POA: Diagnosis not present

## 2019-05-13 DIAGNOSIS — I428 Other cardiomyopathies: Secondary | ICD-10-CM

## 2019-05-13 LAB — POCT GLYCOSYLATED HEMOGLOBIN (HGB A1C): HbA1c, POC (controlled diabetic range): 6.6 % (ref 0.0–7.0)

## 2019-05-13 LAB — GLUCOSE, POCT (MANUAL RESULT ENTRY): POC Glucose: 111 mg/dl — AB (ref 70–99)

## 2019-05-13 MED ORDER — METFORMIN HCL 500 MG PO TABS: 500.0000 mg | ORAL_TABLET | Freq: Every day | ORAL | 3 refills | Status: DC

## 2019-05-13 NOTE — Patient Instructions (Signed)
Please call me with the name of the practice or the surgeon who you would like to see to have the hernia repair done.  I have referred you to the hematologist to evaluate how long you need to be on the blood thinner.  I have referred you to the ophthalmologist and podiatrist as discussed today.   You can purchase Lamisil cream over-the-counter.  This is an antifungal cream that you can use as needed on your feet.  For the dry skin on the feet, I recommend using Aveeno intensive care lotion or Curel lotion to apply to the feet after coming out of the shower.   Diabetes Mellitus and Standards of Medical Care Managing diabetes (diabetes mellitus) can be complicated. Your diabetes treatment may be managed by a team of health care providers, including:  A physician who specializes in diabetes (endocrinologist).  A nurse practitioner or physician assistant.  Nurses.  A diet and nutrition specialist (registered dietitian).  A certified diabetes educator (CDE).  An exercise specialist.  A pharmacist.  An eye doctor.  A foot specialist (podiatrist).  A dentist.  A primary care provider.  A mental health provider. Your health care providers follow guidelines to help you get the best quality of care. The following schedule is a general guideline for your diabetes management plan. Your health care providers may give you more specific instructions. Physical exams Upon being diagnosed with diabetes mellitus, and each year after that, your health care provider will ask about your medical and family history. He or she will also do a physical exam. Your exam may include:  Measuring your height, weight, and body mass index (BMI).  Checking your blood pressure. This will be done at every routine medical visit. Your target blood pressure may vary depending on your medical conditions, your age, and other factors.  Thyroid gland exam.  Skin exam.  Screening for damage to your nerves  (peripheral neuropathy). This may include checking the pulse in your legs and feet and checking the level of sensation in your hands and feet.  A complete foot exam to inspect the structure and skin of your feet, including checking for cuts, bruises, redness, blisters, sores, or other problems.  Screening for blood vessel (vascular) problems, which may include checking the pulse in your legs and feet and checking your temperature. Blood tests Depending on your treatment plan and your personal needs, you may have the following tests done:  HbA1c (hemoglobin A1c). This test provides information about blood sugar (glucose) control over the previous 2-3 months. It is used to adjust your treatment plan, if needed. This test will be done: ? At least 2 times a year, if you are meeting your treatment goals. ? 4 times a year, if you are not meeting your treatment goals or if treatment goals have changed.  Lipid testing, including total, LDL, and HDL cholesterol and triglyceride levels. ? The goal for LDL is less than 100 mg/dL (5.5 mmol/L). If you are at high risk for complications, the goal is less than 70 mg/dL (3.9 mmol/L). ? The goal for HDL is 40 mg/dL (2.2 mmol/L) or higher for men and 50 mg/dL (2.8 mmol/L) or higher for women. An HDL cholesterol of 60 mg/dL (3.3 mmol/L) or higher gives some protection against heart disease. ? The goal for triglycerides is less than 150 mg/dL (8.3 mmol/L).  Liver function tests.  Kidney function tests.  Thyroid function tests. Dental and eye exams  Visit your dentist two times a year.  If you have type 1 diabetes, your health care provider may recommend an eye exam 3-5 years after you are diagnosed, and then once a year after your first exam. ? For children with type 1 diabetes, a health care provider may recommend an eye exam when your child is age 13 or older and has had diabetes for 3-5 years. After the first exam, your child should get an eye exam once a  year.  If you have type 2 diabetes, your health care provider may recommend an eye exam as soon as you are diagnosed, and then once a year after your first exam. Immunizations   The yearly flu (influenza) vaccine is recommended for everyone 6 months or older who has diabetes.  The pneumonia (pneumococcal) vaccine is recommended for everyone 2 years or older who has diabetes. If you are 2 or older, you may get the pneumonia vaccine as a series of two separate shots.  The hepatitis B vaccine is recommended for adults shortly after being diagnosed with diabetes.  Adults and children with diabetes should receive all other vaccines according to age-specific recommendations from the Centers for Disease Control and Prevention (CDC). Mental and emotional health Screening for symptoms of eating disorders, anxiety, and depression is recommended at the time of diagnosis and afterward as needed. If your screening shows that you have symptoms (positive screening result), you may need more evaluation and you may work with a mental health care provider. Treatment plan Your treatment plan will be reviewed at every medical visit. You and your health care provider will discuss:  How you are taking your medicines, including insulin.  Any side effects you are experiencing.  Your blood glucose target goals.  The frequency of your blood glucose monitoring.  Lifestyle habits, such as activity level as well as tobacco, alcohol, and substance use. Diabetes self-management education Your health care provider will assess how well you are monitoring your blood glucose levels and whether you are taking your insulin correctly. He or she may refer you to:  A certified diabetes educator to manage your diabetes throughout your life, starting at diagnosis.  A registered dietitian who can create or review your personal nutrition plan.  An exercise specialist who can discuss your activity level and exercise plan.  Summary  Managing diabetes (diabetes mellitus) can be complicated. Your diabetes treatment may be managed by a team of health care providers.  Your health care providers follow guidelines in order to help you get the best quality of care.  Standards of care including having regular physical exams, blood tests, blood pressure monitoring, immunizations, screening tests, and education about how to manage your diabetes.  Your health care providers may also give you more specific instructions based on your individual health. This information is not intended to replace advice given to you by your health care provider. Make sure you discuss any questions you have with your health care provider. Document Released: 07/14/2009 Document Revised: 06/05/2018 Document Reviewed: 06/14/2016 Elsevier Patient Education  2020 [employer].

## 2019-05-13 NOTE — Progress Notes (Signed)
Patient ID: Jonathan Dickson, male    DOB: 1956-08-21  MRN: [id number]  CC: Hypertension and Diabetes (prediabetes )   Subjective: Jonathan Dickson is a 63 y.o. male who presents for chronic ds management. His concerns today include:  Pt with hx of NICM, chronic systolic CHF with recent EF of 25-30%, ICD,, cardiac amyloidosis HTN, preDM, BL PE, insomnia  C/o intermittent severe itching on sole of feet x 8 mths.  No rash.  So severe he has to take shoes and socks off.  He does not sweat a lot on his feet.  Has ventral hernia that he wants repaired. Reports having seen a surgeon in the spring before the COVID-19 pandemic and plans were on the way to do repair but it ended up being canceled.  He would like a referral to the surgeon to have it addressed at this time.  He does not recall the name of the surgeon.  -Endorses soreness to touch.  He sometimes has discomfort in the area when he eats.  He denies any vomiting or problems moving his bowel. -He wears a waist band  PE: History of bilateral PE in September of last year.he had no provoking factors.  He has since been on Eliquis.  Plan was to refer him to the hematologist to get an opinion of whether he needs lifelong anticoagulation but patient did not come back for follow-up with me.   -No bruising or bleeding.  NICM//HTN/CHF: Followed by Dr. [name].  He had telemedicine visit in the spring of this year.  He reports compliance with medications. -Does not have any chest pain, shortness of breath, lower extremity edema, dizziness or chronic headaches. -He limits salt in his foods.  Pre-DM: -Exercise: Does pull up bar and rides stationary bike for 1/2 hr a day. -Eating habits: doing well with eating habits.  Cut back on sweets.  Eats a lot of chicken and sea food No blurred vision.    New Renal Insuff: Noted on chemistry that was done in January.  His creatinine was 1.76 with GFR of 47.  He is not on any NSAIDs.   Patient Active Problem List    Diagnosis Date Noted  . Type 2 diabetes mellitus without complication, without long-term current use of insulin ([place]) 05/13/2019  . Prediabetes 07/21/2018  . Cardiac amyloidosis ([place]) 07/21/2018  . Chronic systolic heart failure ([place]) 06/17/2018  . Bilateral pulmonary embolism ([place]) 05/12/2018  . Heart failure ([place]) 05/11/2018  . Claudication in peripheral vascular disease ([place]) 12/31/2017  . Decreased hearing of both ears 11/10/2017  . Cervical disc disease with myelopathy 07/10/2016  . NICM (nonischemic cardiomyopathy) ([place]) 03/12/2016  . Congestive dilated cardiomyopathy ([place]) 03/11/2016  . Cervical radiculitis 04/19/2015  . Essential hypertension 09/27/2014     Current Outpatient Medications on File Prior to Visit  Medication Sig Dispense Refill  . acetaminophen (TYLENOL) 500 MG tablet Take 500 mg by mouth every 6 (six) hours as needed for mild pain.     Marland Kitchen amitriptyline (ELAVIL) 25 MG tablet TAKE 1 TABLET(25 MG) BY MOUTH AT BEDTIME AS NEEDED FOR SLEEP 30 tablet 2  . carvedilol (COREG) 25 MG tablet Take 1 tablet (25 mg total) by mouth 2 (two) times daily with a meal. 180 tablet 3  . cholecalciferol (VITAMIN D) 1000 units tablet Take 1,000 Units by mouth daily.    Marland Kitchen ELIQUIS 5 MG TABS tablet TAKE 1 TABLET(5 MG) BY MOUTH TWICE DAILY 180 tablet 3  . furosemide (LASIX) 40  MG tablet Take 1 tablet (40 mg total) by mouth daily as needed for fluid (if weight gain more than 3 punds). (Patient taking differently: Take 40 mg by mouth daily. ) 30 tablet 0  . hydrALAZINE (APRESOLINE) 50 MG tablet Take 1 tablet (50 mg total) by mouth 3 (three) times daily. 90 tablet 3  . metFORMIN (GLUCOPHAGE-XR) 500 MG 24 hr tablet TAKE 1 TABLET(500 MG) BY MOUTH DAILY WITH BREAKFAST 90 tablet 1  . sacubitril-valsartan (ENTRESTO) 97-103 MG Take 1 tablet by mouth 2 (two) times daily. 60 tablet 6  . sildenafil (VIAGRA) 50 MG tablet TAKE 1 TABLET BY MOUTH EVERY DAIY AS NEEDED FOR ERECTILE DYSFUNCTION 10 tablet 2  .  spironolactone (ALDACTONE) 25 MG tablet Take 0.5 tablets (12.5 mg total) by mouth daily. 15 tablet 11  . Tafamidis Meglumine, Cardiac, (VYNDAQEL) 20 MG CAPS Take 80 mg by mouth daily.      No current facility-administered medications on file prior to visit.     Allergies  Allergen Reactions  . Peanut-Containing Drug Products Swelling  . Penicillins Other (See Comments)    CONVULSIONS Has patient had a PCN reaction causing immediate rash, facial/tongue/throat swelling, SOB or lightheadedness with hypotension: No Has patient had a PCN reaction causing severe rash involving mucus membranes or skin necrosis: No Has patient had a PCN reaction that required hospitalization No Has patient had a PCN reaction occurring within the last 10 years: No If all of the above answers are "NO", then may proceed with Cephalosporin use.   . Decadron [Dexamethasone] Itching    Social History   Socioeconomic History  . Marital status: Legally Separated    Spouse name: Not on file  . Number of children: 3  . Years of education: Not on file  . Highest education level: Not on file  Occupational History  . Not on file  Social Needs  . Financial resource strain: Not on file  . Food insecurity    Worry: Not on file    Inability: Not on file  . Transportation needs    Medical: Not on file    Non-medical: Not on file  Tobacco Use  . Smoking status: Former Smoker    Packs/day: 1.50    Years: 8.00    Pack years: 12.00    Quit date: 03/15/2003    Years since quitting: 16.1  . Smokeless tobacco: Never Used  Substance and Sexual Activity  . Alcohol use: No    Comment: none since 2004  . Drug use: No    Comment: former  none since 2004  . Sexual activity: Not Currently  Lifestyle  . Physical activity    Days per week: Not on file    Minutes per session: Not on file  . Stress: Not on file  Relationships  . Social Herbalist on phone: Not on file    Gets together: Not on file     Attends religious service: Not on file    Active member of club or organization: Not on file    Attends meetings of clubs or organizations: Not on file    Relationship status: Not on file  . Intimate partner violence    Fear of current or ex partner: Not on file    Emotionally abused: Not on file    Physically abused: Not on file    Forced sexual activity: Not on file  Other Topics Concern  . Not on file  Social History Narrative  .  Not on file    Family History  Problem Relation Age of Onset  . Diabetes Mother   . Diabetes Sister     Past Surgical History:  Procedure Laterality Date  . ANTERIOR CERVICAL DECOMP/DISCECTOMY FUSION N/A 07/10/2016   Procedure: ANTERIOR CERVICAL DECOMPRESSION FUSION CERVICAL 4-5, CERVICAL 5-6, CERVICAL 6-7 WITH INSTRUMENTATION AND ALLOGRAFT;  Surgeon: [name], MD;  [facility];  Service: Orthopedics;  Laterality: N/A;  . CARDIAC CATHETERIZATION N/A 03/11/2016   Procedure: Left Heart Cath and Coronary Angiography;  Surgeon: [name], MD;  Location: [place] [facility];  Service: Cardiovascular;  Laterality: N/A;  . ICD IMPLANT N/A 06/17/2018   Procedure: ICD IMPLANT;  Surgeon: [name], MD;  Location: [place] [facility];  Service: Cardiovascular;  Laterality: N/A;  . Thumb surgery Right     ROS: Review of Systems Negative except as stated above  PHYSICAL EXAM: BP 117/74   Pulse 88   Temp 98.2 F (36.8 C) (Oral)   Resp 16   Ht 5\' 6"  (1.676 m)   Wt 173 lb 6.4 oz (78.7 kg)   SpO2 95%   BMI 27.99 kg/m   Physical Exam General appearance - alert, well appearing, and in no distress Mental status - normal mood, behavior, speech, dress, motor activity, and thought processes Mouth - mucous membranes moist, pharynx normal without lesions Neck - supple, no significant adenopathy Chest - clear to auscultation, no wheezes, rales or rhonchi, symmetric air entry Heart - normal rate, regular rhythm, normal S1, S2, no murmurs,  rubs, clicks or gallops Abdomen: Protruding ventral hernia noted several centimeters above the umbilicus. Extremities - peripheral pulses normal, no pedal edema, no clubbing or cyanosis Diabetic Foot Exam - Simple   Simple Foot Form Visual Inspection See comments: Yes Sensation Testing Intact to touch and monofilament testing bilaterally: Yes Pulse Check Posterior Tibialis and Dorsalis pulse intact bilaterally: Yes Comments Patient is mildly flat-footed.  Hammertoe on the second toe of each foot.  Feet dry.  No rash seen at this time.  No ulcers or calluses.  Toenails are overgrown especially on the right foot.    Results for orders placed or performed in visit on 05/13/19  POCT glucose (manual entry)  Result Value Ref Range   POC Glucose 111 (A) 70 - 99 mg/dl  POCT glycosylated hemoglobin (Hb A1C)  Result Value Ref Range   Hemoglobin A1C     HbA1c POC (<> result, manual entry)     HbA1c, POC (prediabetic range)     HbA1c, POC (controlled diabetic range) 6.6 0.0 - 7.0 %     CMP Latest Ref Rng & Units 10/12/2018 10/08/2018 10/01/2018  Glucose 70 - 99 mg/dL 109(H) 102(H) 116(H)  BUN 8 - 23 mg/dL 14 14 19   Creatinine 0.61 - 1.24 mg/dL 1.76(H) 1.14 1.28(H)  Sodium 135 - 145 mmol/L 134(L) 134(L) 139  Potassium 3.5 - 5.1 mmol/L 4.4 4.3 4.7  Chloride 98 - 111 mmol/L 100 105 104  CO2 22 - 32 mmol/L 23 21(L) 24  Calcium 8.9 - 10.3 mg/dL 9.0 9.3 9.0  Total Protein 6.5 - 8.1 g/dL - - 6.4(L)  Total Bilirubin 0.3 - 1.2 mg/dL - - 1.0  Alkaline Phos 38 - 126 U/L - - 56  AST 15 - 41 U/L - - 27  ALT 0 - 44 U/L - - 15   Lipid Panel     Component Value Date/Time   CHOL 155 10/04/2014 1508   TRIG 87  10/04/2014 1508   HDL 54 10/04/2014 1508   CHOLHDL 2.9 10/04/2014 1508   VLDL 17 10/04/2014 1508   LDLCALC 84 10/04/2014 1508    CBC    Component Value Date/Time   WBC 11.5 (H) 10/12/2018 1033   RBC 5.13 10/12/2018 1033   HGB 14.7 10/12/2018 1033   HGB 13.8 02/19/2017 1207   HCT 44.3  10/12/2018 1033   HCT 41.9 02/19/2017 1207   PLT 241 10/12/2018 1033   PLT 200 02/19/2017 1207   MCV 86.4 10/12/2018 1033   MCV 87 02/19/2017 1207   MCH 28.7 10/12/2018 1033   MCHC 33.2 10/12/2018 1033   RDW 13.9 10/12/2018 1033   RDW 14.8 02/19/2017 1207   LYMPHSABS 2.4 10/12/2018 1033   LYMPHSABS 2.3 02/19/2017 1207   MONOABS 1.3 (H) 10/12/2018 1033   EOSABS 0.2 10/12/2018 1033   EOSABS 0.2 02/19/2017 1207   BASOSABS 0.1 10/12/2018 1033   BASOSABS 0.0 02/19/2017 1207    ASSESSMENT AND PLAN: 1. Type 2 diabetes mellitus without complication, without long-term current use of insulin (HCC) A1c currently in range for diabetes. Encourage him to continue healthy eating habits.  Counseling given. Continue regular exercise but would avoid pull-ups on his bar given that he has this ventral hernia. Change metformin to regular metformin as there has been a recall on the extended release. - POCT glucose (manual entry) - POCT glycosylated hemoglobin (Hb A1C) - Microalbumin / creatinine urine ratio - Comprehensive metabolic panel - Lipid panel - Ambulatory referral to Ophthalmology  2. Essential hypertension At goal.  Continue current medications  3. Ventral hernia without obstruction or gangrene Patient will call back with the name of the surgeon and I will submit the referral  4. Bilateral pulmonary embolism (HCC) We will get him to the hematologist who saw him when he was in the hospital last year Dr. Truddie Coco for an opinion about whether he needs to be on lifelong anticoagulant - Ambulatory referral to Hematology  5. Renal insufficiency Plan to repeat chemistry today  6. NICM (nonischemic cardiomyopathy) ([place]) 7. Chronic systolic CHF (congestive heart failure) ([place]) -Doing well on guideline directed therapy including carvedilol, Spironolactone, Entresto and hydralazine.  Followed by cardiology  8. Dry skin Recommend using over-the-counter moisturizing lotion on the feet like  Curel or Aveeno intensive care lotion  9. Overgrown toenails - Ambulatory referral to Podiatry  Patient was given the opportunity to ask questions.  Patient verbalized understanding of the plan and was able to repeat key elements of the plan.   Orders Placed This Encounter  Procedures  . Microalbumin / creatinine urine ratio  . Comprehensive metabolic panel  . Lipid panel  . Ambulatory referral to Podiatry  . Ambulatory referral to Ophthalmology  . Ambulatory referral to Hematology  . POCT glucose (manual entry)  . POCT glycosylated hemoglobin (Hb A1C)     Requested Prescriptions    No prescriptions requested or ordered in this encounter    Return in about 3 months (around 08/13/2019).  Karle Plumber, MD, FACP

## 2019-05-14 LAB — LIPID PANEL
Chol/HDL Ratio: 5.9 ratio — ABNORMAL HIGH (ref 0.0–5.0)
Cholesterol, Total: 230 mg/dL — ABNORMAL HIGH (ref 100–199)
HDL: 39 mg/dL — ABNORMAL LOW (ref >39)
LDL Calculated: 131 mg/dL — ABNORMAL HIGH (ref 0–99)
Triglycerides: 302 mg/dL — ABNORMAL HIGH (ref 0–149)
VLDL Cholesterol Cal: 60 mg/dL — ABNORMAL HIGH (ref 5–40)

## 2019-05-14 LAB — COMPREHENSIVE METABOLIC PANEL
ALT: 15 IU/L (ref 0–44)
AST: 18 IU/L (ref 0–40)
Albumin/Globulin Ratio: 1.8 (ref 1.2–2.2)
Albumin: 4.3 g/dL (ref 3.8–4.8)
Alkaline Phosphatase: 68 IU/L (ref 39–117)
BUN/Creatinine Ratio: 12 (ref 10–24)
BUN: 14 mg/dL (ref 8–27)
Bilirubin Total: 0.2 mg/dL (ref 0.0–1.2)
CO2: 18 mmol/L — ABNORMAL LOW (ref 20–29)
Calcium: 9.4 mg/dL (ref 8.6–10.2)
Chloride: 103 mmol/L (ref 96–106)
Creatinine, Ser: 1.14 mg/dL (ref 0.76–1.27)
GFR calc Af Amer: 78 mL/min/{1.73_m2} (ref >59)
GFR calc non Af Amer: 68 mL/min/{1.73_m2} (ref >59)
Globulin, Total: 2.4 g/dL (ref 1.5–4.5)
Glucose: 95 mg/dL (ref 65–99)
Potassium: 4.2 mmol/L (ref 3.5–5.2)
Sodium: 139 mmol/L (ref 134–144)
Total Protein: 6.7 g/dL (ref 6.0–8.5)

## 2019-05-14 LAB — MICROALBUMIN / CREATININE URINE RATIO
Creatinine, Urine: 204.9 mg/dL
Microalb/Creat Ratio: 10 mg/g creat (ref 0–29)
Microalbumin, Urine: 19.8 ug/mL

## 2019-05-15 ENCOUNTER — Other Ambulatory Visit: Payer: Self-pay | Admitting: Internal Medicine

## 2019-05-15 DIAGNOSIS — E785 Hyperlipidemia, unspecified: Secondary | ICD-10-CM

## 2019-05-15 DIAGNOSIS — E1169 Type 2 diabetes mellitus with other specified complication: Secondary | ICD-10-CM | POA: Insufficient documentation

## 2019-05-15 MED ORDER — ATORVASTATIN CALCIUM 20 MG PO TABS: 20.0000 mg | ORAL_TABLET | Freq: Every day | ORAL | 3 refills | Status: DC

## 2019-05-19 ENCOUNTER — Telehealth: Payer: Self-pay | Admitting: Hematology

## 2019-05-19 NOTE — Telephone Encounter (Signed)
Received a new hem referral from Dr. Wynetta Emery at [facility] for bilateral pulmonary embolism. [name] has been cld and scheduled to see Dr. Irene Limbo on 9/10 at 10am, Aware to arrive 15 minutes early.

## 2019-05-21 ENCOUNTER — Other Ambulatory Visit (HOSPITAL_COMMUNITY): Payer: Self-pay

## 2019-05-21 MED ORDER — VYNDAQEL 20 MG PO CAPS: 80.0000 mg | ORAL_CAPSULE | Freq: Every day | ORAL | 3 refills | Status: DC

## 2019-05-28 ENCOUNTER — Encounter: Payer: Self-pay | Admitting: Podiatry

## 2019-05-28 ENCOUNTER — Ambulatory Visit: Payer: Medicaid Other | Admitting: Podiatry

## 2019-05-28 ENCOUNTER — Other Ambulatory Visit: Payer: Self-pay

## 2019-05-28 DIAGNOSIS — M79675 Pain in left toe(s): Secondary | ICD-10-CM

## 2019-05-28 DIAGNOSIS — E119 Type 2 diabetes mellitus without complications: Secondary | ICD-10-CM

## 2019-05-28 DIAGNOSIS — M79674 Pain in right toe(s): Secondary | ICD-10-CM

## 2019-05-28 DIAGNOSIS — B351 Tinea unguium: Secondary | ICD-10-CM

## 2019-05-28 NOTE — Progress Notes (Signed)
This patient presents to the office with chief complaint of long thick nails and diabetic feet.  This patient  says there  is  no pain and discomfort in their feet.  This patient says there are long thick painful nails.  These nails are painful walking and wearing shoes.  Patient has no history of infection or drainage from both feet.  Patient is unable to  self treat his own nails . This patient presents  to the office today for treatment of the  long nails and a foot evaluation due to history of  Diabetes. Patient is taking eliquiss.  General Appearance  Alert, conversant and in no acute stress.  Vascular  Dorsalis pedis and posterior tibial  pulses are palpable  bilaterally.  Capillary return is within normal limits  bilaterally. Temperature is within normal limits  bilaterally.  Neurologic  Senn-Weinstein monofilament wire test within normal limits  bilaterally. Muscle power within normal limits bilaterally.  Nails Thick disfigured discolored nails with subungual debris  from hallux to fifth toes bilaterally. No evidence of bacterial infection or drainage bilaterally.  Orthopedic  No limitations of motion of motion feet .  No crepitus or effusions noted.  HAV  B/L.  Hammer toe second right.  Skin  normotropic skin with no porokeratosis noted bilaterally.  No signs of infections or ulcers noted.     Onychomycosis  Diabetes with no foot complications  IE  Debride nails x 10.  A diabetic foot exam was performed and there is no evidence of any vascular or neurologic pathology.   RTC 3 months.   Gardiner Barefoot DPM

## 2019-06-02 ENCOUNTER — Other Ambulatory Visit: Payer: Self-pay | Admitting: Cardiovascular Disease

## 2019-06-03 ENCOUNTER — Other Ambulatory Visit (HOSPITAL_COMMUNITY): Payer: Self-pay

## 2019-06-03 MED ORDER — SPIRONOLACTONE 25 MG PO TABS: 12.5000 mg | ORAL_TABLET | Freq: Every day | ORAL | 11 refills | Status: DC

## 2019-06-10 ENCOUNTER — Inpatient Hospital Stay: Payer: Medicaid Other | Attending: Hematology | Admitting: Hematology

## 2019-06-10 ENCOUNTER — Other Ambulatory Visit: Payer: Self-pay

## 2019-06-10 VITALS — BP 123/79 | HR 95 | Temp 98.7°F | Resp 18 | Ht 66.0 in | Wt 173.3 lb

## 2019-06-10 DIAGNOSIS — I252 Old myocardial infarction: Secondary | ICD-10-CM | POA: Insufficient documentation

## 2019-06-10 DIAGNOSIS — Z7984 Long term (current) use of oral hypoglycemic drugs: Secondary | ICD-10-CM | POA: Insufficient documentation

## 2019-06-10 DIAGNOSIS — Z86718 Personal history of other venous thrombosis and embolism: Secondary | ICD-10-CM | POA: Diagnosis not present

## 2019-06-10 DIAGNOSIS — I2699 Other pulmonary embolism without acute cor pulmonale: Secondary | ICD-10-CM

## 2019-06-10 DIAGNOSIS — Z7901 Long term (current) use of anticoagulants: Secondary | ICD-10-CM | POA: Diagnosis not present

## 2019-06-10 DIAGNOSIS — Z86711 Personal history of pulmonary embolism: Secondary | ICD-10-CM | POA: Insufficient documentation

## 2019-06-10 DIAGNOSIS — Z87891 Personal history of nicotine dependence: Secondary | ICD-10-CM | POA: Diagnosis not present

## 2019-06-10 DIAGNOSIS — R7303 Prediabetes: Secondary | ICD-10-CM | POA: Insufficient documentation

## 2019-06-10 DIAGNOSIS — I110 Hypertensive heart disease with heart failure: Secondary | ICD-10-CM | POA: Diagnosis not present

## 2019-06-10 DIAGNOSIS — I5042 Chronic combined systolic (congestive) and diastolic (congestive) heart failure: Secondary | ICD-10-CM | POA: Insufficient documentation

## 2019-06-10 DIAGNOSIS — Z79899 Other long term (current) drug therapy: Secondary | ICD-10-CM | POA: Diagnosis not present
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
## 2019-06-11 ENCOUNTER — Telehealth: Payer: Self-pay | Admitting: Hematology

## 2019-06-11 NOTE — Telephone Encounter (Signed)
No los per 9/10. °

## 2019-06-15 LAB — HM DIABETES EYE EXAM

## 2019-06-22 ENCOUNTER — Other Ambulatory Visit: Payer: Self-pay | Admitting: Physician Assistant

## 2019-06-22 DIAGNOSIS — G4700 Insomnia, unspecified: Secondary | ICD-10-CM

## 2019-06-28 ENCOUNTER — Emergency Department (HOSPITAL_COMMUNITY)
Admission: EM | Admit: 2019-06-28 | Discharge: 2019-06-29 | Disposition: A | Discharge status: Home / Self Care | Payer: Medicaid Other | Attending: Emergency Medicine | Admitting: Emergency Medicine

## 2019-06-28 ENCOUNTER — Emergency Department (HOSPITAL_COMMUNITY): Payer: Medicaid Other

## 2019-06-28 ENCOUNTER — Other Ambulatory Visit: Payer: Self-pay

## 2019-06-28 ENCOUNTER — Encounter (HOSPITAL_COMMUNITY): Payer: Self-pay

## 2019-06-28 DIAGNOSIS — M79602 Pain in left arm: Secondary | ICD-10-CM | POA: Insufficient documentation

## 2019-06-28 DIAGNOSIS — I110 Hypertensive heart disease with heart failure: Secondary | ICD-10-CM | POA: Insufficient documentation

## 2019-06-28 DIAGNOSIS — Z79899 Other long term (current) drug therapy: Secondary | ICD-10-CM | POA: Insufficient documentation

## 2019-06-28 DIAGNOSIS — Z91010 Allergy to peanuts: Secondary | ICD-10-CM | POA: Diagnosis not present

## 2019-06-28 DIAGNOSIS — I5042 Chronic combined systolic (congestive) and diastolic (congestive) heart failure: Secondary | ICD-10-CM | POA: Diagnosis not present

## 2019-06-28 DIAGNOSIS — Z87891 Personal history of nicotine dependence: Secondary | ICD-10-CM | POA: Diagnosis not present

## 2019-06-28 DIAGNOSIS — Z7984 Long term (current) use of oral hypoglycemic drugs: Secondary | ICD-10-CM | POA: Insufficient documentation

## 2019-06-28 DIAGNOSIS — Z7901 Long term (current) use of anticoagulants: Secondary | ICD-10-CM | POA: Insufficient documentation

## 2019-06-28 DIAGNOSIS — E119 Type 2 diabetes mellitus without complications: Secondary | ICD-10-CM | POA: Diagnosis not present

## 2019-06-28 DIAGNOSIS — Z95810 Presence of automatic (implantable) cardiac defibrillator: Secondary | ICD-10-CM | POA: Diagnosis not present

## 2019-06-28 LAB — BASIC METABOLIC PANEL
Anion gap: 12 (ref 5–15)
BUN: 15 mg/dL (ref 8–23)
CO2: 18 mmol/L — ABNORMAL LOW (ref 22–32)
Calcium: 9.1 mg/dL (ref 8.9–10.3)
Chloride: 103 mmol/L (ref 98–111)
Creatinine, Ser: 1.29 mg/dL — ABNORMAL HIGH (ref 0.61–1.24)
GFR calc Af Amer: >60 mL/min (ref >60)
GFR calc non Af Amer: 58 mL/min — ABNORMAL LOW (ref >60)
Glucose, Bld: 117 mg/dL — ABNORMAL HIGH (ref 70–99)
Potassium: 4.4 mmol/L (ref 3.5–5.1)
Sodium: 133 mmol/L — ABNORMAL LOW (ref 135–145)

## 2019-06-28 LAB — CBC
HCT: 44 % (ref 39.0–52.0)
Hemoglobin: 14.1 g/dL (ref 13.0–17.0)
MCH: 28.5 pg (ref 26.0–34.0)
MCHC: 32 g/dL (ref 30.0–36.0)
MCV: 89.1 fL (ref 80.0–100.0)
Platelets: 210 10*3/uL (ref 150–400)
RBC: 4.94 MIL/uL (ref 4.22–5.81)
RDW: 13.6 % (ref 11.5–15.5)
WBC: 6.9 10*3/uL (ref 4.0–10.5)
nRBC: 0 % (ref 0.0–0.2)

## 2019-06-28 LAB — TROPONIN I (HIGH SENSITIVITY)
Troponin I (High Sensitivity): 11 ng/L (ref <18)
Troponin I (High Sensitivity): 11 ng/L (ref <18)

## 2019-06-28 MED ORDER — SODIUM CHLORIDE 0.9% FLUSH: 3.0000 mL | Freq: Once | INTRAVENOUS | Status: DC

## 2019-06-28 NOTE — ED Triage Notes (Signed)
Pt reports left arm "shocking" pains, intermittent for the past 3 days, radiates to his neck and jaw. Denies Chest pain. Pt a.o, ambulatory, hx of multiple heart attacks.

## 2019-06-29 ENCOUNTER — Ambulatory Visit (INDEPENDENT_AMBULATORY_CARE_PROVIDER_SITE_OTHER): Payer: Medicaid Other | Admitting: *Deleted

## 2019-06-29 DIAGNOSIS — I5022 Chronic systolic (congestive) heart failure: Secondary | ICD-10-CM

## 2019-06-29 DIAGNOSIS — I428 Other cardiomyopathies: Secondary | ICD-10-CM

## 2019-06-29 LAB — CUP PACEART REMOTE DEVICE CHECK
Battery Remaining Longevity: 85 mo
Battery Remaining Percentage: 85 %
Battery Voltage: 3.02 V
Brady Statistic RV Percent Paced: 1 %
Date Time Interrogation Session: 20200929060017
HighPow Impedance: 73 Ohm
HighPow Impedance: 75 Ohm
Implantable Lead Implant Date: 20190918
Implantable Lead Location: 753860
Implantable Lead Model: 7122
Implantable Pulse Generator Implant Date: 20190918
Lead Channel Impedance Value: 460 Ohm
Lead Channel Pacing Threshold Amplitude: 0.5 V
Lead Channel Pacing Threshold Pulse Width: 0.5 ms
Lead Channel Sensing Intrinsic Amplitude: 12 mV
Lead Channel Setting Pacing Amplitude: 2.5 V
Lead Channel Setting Pacing Pulse Width: 0.5 ms
Lead Channel Setting Sensing Sensitivity: 0.5 mV
Pulse Gen Serial Number: 9796641

## 2019-06-29 NOTE — ED Notes (Signed)
[employer] rep contacted via phone by Secretary for interrogation.

## 2019-06-29 NOTE — ED Notes (Signed)
Per [employer] rep, no abnormalities since March; ICD appears to be working appropriately

## 2019-06-29 NOTE — ED Provider Notes (Signed)
[facility] EMERGENCY DEPARTMENT Provider Note   CSN: [id number] Arrival date & time: 06/28/19  1819     History   Chief Complaint Chief Complaint  Patient presents with  . Arm Pain    HPI Jonathan Dickson is a 63 y.o. male.     Patient presents to the emergency department for evaluation of intermittent sharp pains on his left side.  He reports for the last 3 days he has been experiencing a sharp electric-like pain that shoots to the left side of his face and down his left arm to the fingers.  Pain lasts for a second and then resolves.  It has been recurrent over time but he has not identified anything that causes it to happen.  Patient is concerned because he has a history of heart disease including an ICD.  His ICD has never fired.  He has not felt any shock or pain in the chest.     Past Medical History:  Diagnosis Date  . Abnormal TSH 02/2016  . Anxiety   . Cervical disc disease   . Chronic combined systolic and diastolic CHF (congestive heart failure) ([place])    a. 01/2016 Echo: EF 25%, inf AK, diffuse sev HK, Gr1 DD, mild MR.  . Depression   . Hyperglycemia   . Hypertension   . Hypertensive heart disease   . Myocardial infarction [facility])    ??  maybe  . NICM (nonischemic cardiomyopathy) ([place])    a. 01/2016 Echo: EF 25%, inf AK, diffuse sev HK, Gr1 DD, mild MR; b. 01/2016 MV: EF 22%, no isch/infarct;  c. 02/2016 Cath: Nl cors, EF 35-45%.  . Pre-diabetes   . Seizures ([place])    with penicillin    Patient Active Problem List   Diagnosis Date Noted  . Pain due to onychomycosis of toenails of both feet 05/28/2019  . Hyperlipidemia associated with type 2 diabetes mellitus ([place]) 05/15/2019  . Type 2 diabetes mellitus without complication, without long-term current use of insulin ([place]) 05/13/2019  . 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 Chronic systolic heart failure ([place]) 06/17/2018  . Bilateral pulmonary embolism ([place]) 05/12/2018  . Heart failure ([place])  05/11/2018  . Claudication in peripheral vascular disease ([place]) 12/31/2017  . Decreased hearing of both ears 11/10/2017  . Cervical disc disease with myelopathy 07/10/2016  . NICM (nonischemic cardiomyopathy) ([place]) 03/12/2016  . Congestive dilated cardiomyopathy ([place]) 03/11/2016  . Cervical radiculitis 04/19/2015  . Essential hypertension 09/27/2014    Past Surgical History:  Procedure Laterality Date  . ANTERIOR CERVICAL DECOMP/DISCECTOMY FUSION N/A 07/10/2016   Procedure: ANTERIOR CERVICAL DECOMPRESSION FUSION CERVICAL 4-5, CERVICAL 5-6, CERVICAL 6-7 WITH INSTRUMENTATION AND ALLOGRAFT;  Surgeon: [name], MD;  Location: [facility];  Service: Orthopedics;  Laterality: N/A;  . CARDIAC CATHETERIZATION N/A 03/11/2016   Procedure: Left Heart Cath and Coronary Angiography;  Surgeon: [name], MD;  Location: [facility] CV LAB;  Service: Cardiovascular;  Laterality: N/A;  . ICD IMPLANT N/A 06/17/2018   Procedure: ICD IMPLANT;  Surgeon: [name], MD;  Location: [facility] CV LAB;  Service: Cardiovascular;  Laterality: N/A;  . Thumb surgery Right         Home Medications    Prior to Admission medications   Medication Sig Start Date End Date Taking? Authorizing Provider  acetaminophen (TYLENOL) 500 MG tablet Take 500 mg by mouth every 6 (six) hours as needed for mild pain.     [provider]  amitriptyline Madelin Headings)  25 MG tablet TAKE 1 TABLET(25 MG) BY MOUTH AT BEDTIME AS NEEDED FOR SLEEP 06/23/19   Ladell Pier, MD  atorvastatin (LIPITOR) 20 MG tablet Take 1 tablet (20 mg total) by mouth daily. 05/15/19   Ladell Pier, MD  carvedilol (COREG) 25 MG tablet Take 1 tablet (25 mg total) by mouth 2 (two) times daily with a meal. 12/29/18   Camnitz, Ocie Doyne, MD  cholecalciferol (VITAMIN D) 1000 units tablet Take 1,000 Units by mouth daily.    [provider]  ELIQUIS 5 MG TABS tablet TAKE 1 TABLET(5 MG) BY MOUTH TWICE DAILY 01/18/19   Bensimhon,  Shaune Pascal, MD  furosemide (LASIX) 40 MG tablet Take 1 tablet (40 mg total) by mouth daily as needed for fluid (if weight gain more than 3 punds). Patient taking differently: Take 40 mg by mouth daily.  05/14/18 05/14/19  Ghimire, Henreitta Leber, MD  hydrALAZINE (APRESOLINE) 50 MG tablet TAKE 1 TABLET(50 MG) BY MOUTH THREE TIMES DAILY 06/03/19   Lorretta Harp, MD  metFORMIN (GLUCOPHAGE) 500 MG tablet Take 1 tablet (500 mg total) by mouth daily with breakfast. 05/13/19   Ladell Pier, MD  sacubitril-valsartan (ENTRESTO) 97-103 MG Take 1 tablet by mouth 2 (two) times daily. 03/25/19   Argentina Donovan, PA-C  sildenafil (VIAGRA) 50 MG tablet TAKE 1 TABLET BY MOUTH EVERY DAIY AS NEEDED FOR ERECTILE DYSFUNCTION 03/25/19   Argentina Donovan, PA-C  spironolactone (ALDACTONE) 25 MG tablet Take 0.5 tablets (12.5 mg total) by mouth daily. 06/03/19   Bensimhon, Shaune Pascal, MD  Tafamidis Meglumine, Cardiac, (VYNDAQEL) 20 MG CAPS Take 80 mg by mouth daily. 05/21/19   Bensimhon, Shaune Pascal, MD    Family History Family History  Problem Relation Age of Onset  . Diabetes Mother   . Diabetes Sister     Social History Social History   Tobacco Use  . Smoking status: Former Smoker    Packs/day: 1.50    Years: 8.00    Pack years: 12.00    Quit date: 03/15/2003    Years since quitting: 16.3  . Smokeless tobacco: Never Used  Substance Use Topics  . Alcohol use: No    Comment: none since 2004  . Drug use: No    Comment: former  none since 2004     Allergies   Peanut-containing drug products, Penicillins, and Decadron [dexamethasone]   Review of Systems Review of Systems  Respiratory: Negative for shortness of breath.   Cardiovascular: Negative for chest pain.  All other systems reviewed and are negative.    Physical Exam Updated Vital Signs BP 131/79   Pulse 75   Temp 98.1 F (36.7 C) (Oral)   Resp 18   SpO2 98%   Physical Exam   ED Treatments / Results  Labs (all labs ordered are listed,  but only abnormal results are displayed) Labs Reviewed  BASIC METABOLIC PANEL - Abnormal; Notable for the following components:      Result Value   Sodium 133 (*)    CO2 18 (*)    Glucose, Bld 117 (*)    Creatinine, Ser 1.29 (*)    GFR calc non Af Amer 58 (*)    All other components within normal limits  CBC  TROPONIN I (HIGH SENSITIVITY)  TROPONIN I (HIGH SENSITIVITY)    EKG None  Radiology Dg Chest 2 View  Result Date: 06/28/2019 CLINICAL DATA:  Chest pain EXAM: CHEST - 2 VIEW COMPARISON:  October 12, 2018  FINDINGS: Single lead AICD device identified. The heart, hila, mediastinum, lungs, and pleura are normal. IMPRESSION: No acute abnormalities. Electronically Signed   By: Dorise Bullion III M.D   On: 06/28/2019 19:57    Procedures Procedures (including critical care time)  Medications Ordered in ED Medications  sodium chloride flush (NS) 0.9 % injection 3 mL (3 mLs Intravenous Not Given 06/29/19 0047)     Initial Impression / Assessment and Plan / ED Course  I have reviewed the triage vital signs and the nursing notes.  Pertinent labs & imaging results that were available during my care of the patient were reviewed by me and considered in my medical decision making (see chart for details).        Patient presents to the emergency department for evaluation of pain on his left side.  Patient experiencing intermittent episodes of a sharp, stabbing and electrical type lancinating pain on the left side of his neck, face and down his left arm.  Symptoms last for seconds and then resolved.  I cannot reproduce the pain with palpation or with movements of the arm or neck.  Patient had a heart catheterization in 2017 that showed normal coronary arteries.  He does have a nonischemic cardiomyopathy but does not appear to have decompensation of congestive heart failure at this time.  Pain is very atypical.  Unchanged EKG with 2 troponins are very reassuring.  No associated shortness  of breath, continued pain, pleuritic pain, tachypnea or hypoxia.  Does have history of PE but is on Eliquis and doubt recurrence.  Because the patient was experiencing electric shock type symptoms, [place] ICD was interrogated.  Normal function, no shocks delivered.  Pain seems to be neuropathic in origin.  He does not have neck pain, neck tenderness, could be cervical radiculopathy but neurologic exam is otherwise normal.  Follow-up with PCP.  Final Clinical Impressions(s) / ED Diagnoses   Final diagnoses:  Left arm pain    ED Discharge Orders    None       Frans Valente, Gwenyth Allegra, MD 06/29/19 [phone]

## 2019-06-29 NOTE — ED Notes (Signed)
[employer] rep at bedside.  

## 2019-06-30 ENCOUNTER — Encounter: Payer: Self-pay | Admitting: Internal Medicine

## 2019-06-30 DIAGNOSIS — H35039 Hypertensive retinopathy, unspecified eye: Secondary | ICD-10-CM | POA: Insufficient documentation

## 2019-07-02 ENCOUNTER — Other Ambulatory Visit: Payer: Self-pay | Admitting: Cardiovascular Disease

## 2019-07-05 NOTE — Telephone Encounter (Signed)
New Message     *STAT* If patient is at the pharmacy, call can be transferred to refill team.   1. Which medications need to be refilled? (please list name of each medication and dose if known) Hydralazine 50mg    2. Which pharmacy/location (including street and city if local pharmacy) is medication to be sent to? [facility] on [address]  3. Do they need a 30 day or 90 day supply? 90 day supply

## 2019-07-05 NOTE — Telephone Encounter (Signed)
Rx has been sent to the pharmacy electronically. ° °

## 2019-07-09 NOTE — Progress Notes (Signed)
Remote ICD transmission.   

## 2019-08-02 ENCOUNTER — Other Ambulatory Visit: Payer: Self-pay | Admitting: Cardiovascular Disease

## 2019-08-04 ENCOUNTER — Ambulatory Visit: Payer: Self-pay | Attending: Internal Medicine | Admitting: Physician Assistant

## 2019-08-04 ENCOUNTER — Other Ambulatory Visit: Payer: Self-pay

## 2019-08-04 ENCOUNTER — Ambulatory Visit (HOSPITAL_BASED_OUTPATIENT_CLINIC_OR_DEPARTMENT_OTHER)
Admission: RE | Admit: 2019-08-04 | Discharge: 2019-08-04 | Disposition: A | Discharge status: Home / Self Care | Payer: Medicaid Other | Source: Ambulatory Visit | Attending: Internal Medicine | Admitting: Internal Medicine

## 2019-08-04 ENCOUNTER — Other Ambulatory Visit (HOSPITAL_COMMUNITY): Payer: Self-pay

## 2019-08-04 ENCOUNTER — Encounter (HOSPITAL_COMMUNITY): Payer: Self-pay | Admitting: *Deleted

## 2019-08-04 ENCOUNTER — Observation Stay (HOSPITAL_COMMUNITY)
Admission: EM | Admit: 2019-08-04 | Discharge: 2019-08-05 | Discharge status: Against Medical Advice | Payer: Medicaid Other | Attending: Internal Medicine | Admitting: Internal Medicine

## 2019-08-04 VITALS — BP 98/64 | HR 96 | Temp 97.6°F | Ht 66.0 in | Wt 171.2 lb

## 2019-08-04 DIAGNOSIS — Z7901 Long term (current) use of anticoagulants: Secondary | ICD-10-CM | POA: Diagnosis not present

## 2019-08-04 DIAGNOSIS — Z7984 Long term (current) use of oral hypoglycemic drugs: Secondary | ICD-10-CM | POA: Insufficient documentation

## 2019-08-04 DIAGNOSIS — I5042 Chronic combined systolic (congestive) and diastolic (congestive) heart failure: Secondary | ICD-10-CM | POA: Diagnosis not present

## 2019-08-04 DIAGNOSIS — E785 Hyperlipidemia, unspecified: Secondary | ICD-10-CM | POA: Diagnosis not present

## 2019-08-04 DIAGNOSIS — I82432 Acute embolism and thrombosis of left popliteal vein: Secondary | ICD-10-CM | POA: Diagnosis present

## 2019-08-04 DIAGNOSIS — F329 Major depressive disorder, single episode, unspecified: Secondary | ICD-10-CM | POA: Insufficient documentation

## 2019-08-04 DIAGNOSIS — I110 Hypertensive heart disease with heart failure: Secondary | ICD-10-CM | POA: Diagnosis not present

## 2019-08-04 DIAGNOSIS — I43 Cardiomyopathy in diseases classified elsewhere: Secondary | ICD-10-CM | POA: Insufficient documentation

## 2019-08-04 DIAGNOSIS — Z95810 Presence of automatic (implantable) cardiac defibrillator: Secondary | ICD-10-CM | POA: Diagnosis not present

## 2019-08-04 DIAGNOSIS — M79662 Pain in left lower leg: Secondary | ICD-10-CM

## 2019-08-04 DIAGNOSIS — F419 Anxiety disorder, unspecified: Secondary | ICD-10-CM | POA: Insufficient documentation

## 2019-08-04 DIAGNOSIS — Z86718 Personal history of other venous thrombosis and embolism: Secondary | ICD-10-CM

## 2019-08-04 DIAGNOSIS — Z87891 Personal history of nicotine dependence: Secondary | ICD-10-CM | POA: Insufficient documentation

## 2019-08-04 DIAGNOSIS — Z86711 Personal history of pulmonary embolism: Secondary | ICD-10-CM | POA: Insufficient documentation

## 2019-08-04 DIAGNOSIS — E1151 Type 2 diabetes mellitus with diabetic peripheral angiopathy without gangrene: Secondary | ICD-10-CM | POA: Diagnosis not present

## 2019-08-04 DIAGNOSIS — E854 Organ-limited amyloidosis: Secondary | ICD-10-CM | POA: Diagnosis not present

## 2019-08-04 DIAGNOSIS — Z79899 Other long term (current) drug therapy: Secondary | ICD-10-CM | POA: Diagnosis not present

## 2019-08-04 DIAGNOSIS — E1165 Type 2 diabetes mellitus with hyperglycemia: Secondary | ICD-10-CM | POA: Insufficient documentation

## 2019-08-04 DIAGNOSIS — G4700 Insomnia, unspecified: Secondary | ICD-10-CM | POA: Diagnosis not present

## 2019-08-04 DIAGNOSIS — I82409 Acute embolism and thrombosis of unspecified deep veins of unspecified lower extremity: Secondary | ICD-10-CM | POA: Diagnosis present

## 2019-08-04 DIAGNOSIS — E871 Hypo-osmolality and hyponatremia: Secondary | ICD-10-CM

## 2019-08-04 DIAGNOSIS — Z5181 Encounter for therapeutic drug level monitoring: Secondary | ICD-10-CM

## 2019-08-04 HISTORY — DX: Personal history of other venous thrombosis and embolism: Z86.718

## 2019-08-04 LAB — CBC WITH DIFFERENTIAL/PLATELET
Abs Immature Granulocytes: 0.02 10*3/uL (ref 0.00–0.07)
Basophils Absolute: 0.1 10*3/uL (ref 0.0–0.1)
Basophils Relative: 1 %
Eosinophils Absolute: 0.1 10*3/uL (ref 0.0–0.5)
Eosinophils Relative: 2 %
HCT: 45.3 % (ref 39.0–52.0)
Hemoglobin: 14.5 g/dL (ref 13.0–17.0)
Immature Granulocytes: 0 %
Lymphocytes Relative: 44 %
Lymphs Abs: 2.8 10*3/uL (ref 0.7–4.0)
MCH: 28.2 pg (ref 26.0–34.0)
MCHC: 32 g/dL (ref 30.0–36.0)
MCV: 88.1 fL (ref 80.0–100.0)
Monocytes Absolute: 0.4 10*3/uL (ref 0.1–1.0)
Monocytes Relative: 6 %
Neutro Abs: 3 10*3/uL (ref 1.7–7.7)
Neutrophils Relative %: 47 %
Platelets: 217 10*3/uL (ref 150–400)
RBC: 5.14 MIL/uL (ref 4.22–5.81)
RDW: 13.9 % (ref 11.5–15.5)
WBC: 6.4 10*3/uL (ref 4.0–10.5)
nRBC: 0 % (ref 0.0–0.2)

## 2019-08-04 LAB — BASIC METABOLIC PANEL
Anion gap: 9 (ref 5–15)
BUN: 18 mg/dL (ref 8–23)
CO2: 21 mmol/L — ABNORMAL LOW (ref 22–32)
Calcium: 9.7 mg/dL (ref 8.9–10.3)
Chloride: 106 mmol/L (ref 98–111)
Creatinine, Ser: 1.2 mg/dL (ref 0.61–1.24)
GFR calc Af Amer: >60 mL/min (ref >60)
GFR calc non Af Amer: >60 mL/min (ref >60)
Glucose, Bld: 110 mg/dL — ABNORMAL HIGH (ref 70–99)
Potassium: 4.2 mmol/L (ref 3.5–5.1)
Sodium: 136 mmol/L (ref 135–145)

## 2019-08-04 LAB — PROTIME-INR
INR: 1.1 (ref 0.8–1.2)
Prothrombin Time: 14.5 seconds (ref 11.4–15.2)

## 2019-08-04 LAB — GLUCOSE, CAPILLARY: Glucose-Capillary: 160 mg/dL — ABNORMAL HIGH (ref 70–99)

## 2019-08-04 MED ORDER — METHOCARBAMOL 500 MG PO TABS: 500.0000 mg | ORAL_TABLET | Freq: Three times a day (TID) | ORAL | 0 refills | Status: DC | PRN

## 2019-08-04 MED ORDER — VITAMIN D 25 MCG (1000 UNIT) PO TABS
1000.0000 [IU] | ORAL_TABLET | Freq: Every day | ORAL | Status: DC
  Administered 2019-08-05: 1000 [IU] via ORAL
  Filled 2019-08-04: qty 1

## 2019-08-04 MED ORDER — SPIRONOLACTONE 12.5 MG HALF TABLET
12.5000 mg | ORAL_TABLET | Freq: Every day | ORAL | Status: DC
  Administered 2019-08-05: 12.5 mg via ORAL
  Filled 2019-08-04: qty 1

## 2019-08-04 MED ORDER — CARVEDILOL 25 MG PO TABS
25.0000 mg | ORAL_TABLET | Freq: Two times a day (BID) | ORAL | Status: DC
  Administered 2019-08-05: 25 mg via ORAL
  Filled 2019-08-04: qty 1

## 2019-08-04 MED ORDER — VITAMIN D3 25 MCG PO TABS: 1000.0000 [IU] | ORAL_TABLET | Freq: Every day | ORAL | Status: DC

## 2019-08-04 MED ORDER — ENOXAPARIN SODIUM 80 MG/0.8ML ~~LOC~~ SOLN: 1.0000 mg/kg | Freq: Two times a day (BID) | SUBCUTANEOUS | Status: DC

## 2019-08-04 MED ORDER — HYDRALAZINE HCL 50 MG PO TABS
50.0000 mg | ORAL_TABLET | Freq: Three times a day (TID) | ORAL | Status: DC
  Administered 2019-08-04 – 2019-08-05 (×2): 50 mg via ORAL
  Filled 2019-08-04 (×2): qty 1

## 2019-08-04 MED ORDER — AMITRIPTYLINE HCL 25 MG PO TABS: 25.0000 mg | ORAL_TABLET | Freq: Every evening | ORAL | Status: DC | PRN

## 2019-08-04 MED ORDER — ENOXAPARIN SODIUM 80 MG/0.8ML ~~LOC~~ SOLN
80.0000 mg | Freq: Two times a day (BID) | SUBCUTANEOUS | Status: DC
  Administered 2019-08-04 – 2019-08-05 (×2): 80 mg via SUBCUTANEOUS
  Filled 2019-08-04 (×2): qty 0.8

## 2019-08-04 MED ORDER — SACUBITRIL-VALSARTAN 97-103 MG PO TABS
1.0000 | ORAL_TABLET | Freq: Two times a day (BID) | ORAL | Status: DC
  Administered 2019-08-05 (×2): 1 via ORAL
  Filled 2019-08-04 (×3): qty 1

## 2019-08-04 MED ORDER — TAFAMIDIS MEGLUMINE (CARDIAC) 20 MG PO CAPS: 80.0000 mg | ORAL_CAPSULE | Freq: Every day | ORAL | Status: DC

## 2019-08-04 MED ORDER — INSULIN ASPART 100 UNIT/ML ~~LOC~~ SOLN: 0.0000 [IU] | Freq: Three times a day (TID) | SUBCUTANEOUS | Status: DC

## 2019-08-04 MED ORDER — ACETAMINOPHEN 325 MG PO TABS
650.0000 mg | ORAL_TABLET | Freq: Four times a day (QID) | ORAL | Status: DC | PRN
  Administered 2019-08-05: 650 mg via ORAL
  Filled 2019-08-04: qty 2

## 2019-08-04 MED ORDER — ACETAMINOPHEN 650 MG RE SUPP: 650.0000 mg | Freq: Four times a day (QID) | RECTAL | Status: DC | PRN

## 2019-08-04 MED ORDER — FUROSEMIDE 40 MG PO TABS
40.0000 mg | ORAL_TABLET | Freq: Every day | ORAL | Status: DC
  Administered 2019-08-05: 09:00:00 40 mg via ORAL
  Filled 2019-08-04: qty 1

## 2019-08-04 MED ORDER — ATORVASTATIN CALCIUM 10 MG PO TABS
20.0000 mg | ORAL_TABLET | Freq: Every day | ORAL | Status: DC
  Administered 2019-08-05: 09:00:00 20 mg via ORAL
  Filled 2019-08-04: qty 2

## 2019-08-04 MED ORDER — SENNOSIDES-DOCUSATE SODIUM 8.6-50 MG PO TABS: 1.0000 | ORAL_TABLET | Freq: Every evening | ORAL | Status: DC | PRN

## 2019-08-04 MED ORDER — METHOCARBAMOL 500 MG PO TABS: 500.0000 mg | ORAL_TABLET | Freq: Three times a day (TID) | ORAL | Status: DC | PRN

## 2019-08-04 NOTE — Progress Notes (Signed)
trouble with left leg  muscle tightness around caft

## 2019-08-04 NOTE — Progress Notes (Signed)
Patient ID: Jonathan Dickson, male   DOB: [birth year], 64 y.o.   MRN: [id number]   Jonathan Dickson, is a 63 y.o. male  [id number]  [id number]  DOB - November 10, 1955  Subjective:  Chief Complaint and HPI: Jonathan Dickson is a 63 y.o. male here today with cramping/pulling/tightness in his L calf.  He describes the pain as mild to moderate.  Occurring X 6 weeks on and off.  Describes as sharp/tight/pulling.  Denies CP.  Denies SOB.  H/o B PE.  On Eliquis and is compliant.  NKI.    ROS:   Constitutional:  No f/c, No night sweats, No unexplained weight loss. EENT:  No vision changes, No blurry vision, No hearing changes. No mouth, throat, or ear problems.  Respiratory: No cough, No SOB Cardiac: No CP, no palpitations GI:  No abd pain, No N/V/D. GU: No Urinary s/sx Musculoskeletal: No joint pain Neuro: No headache, no dizziness, no motor weakness.  Skin: No rash Endocrine:  No polydipsia. No polyuria.  Psych: Denies SI/HI  No problems updated.  ALLERGIES: Allergies  Allergen Reactions  . Peanut-Containing Drug Products Swelling  . Penicillins Other (See Comments)    CONVULSIONS Has patient had a PCN reaction causing immediate rash, facial/tongue/throat swelling, SOB or lightheadedness with hypotension: No Has patient had a PCN reaction causing severe rash involving mucus membranes or skin necrosis: No Has patient had a PCN reaction that required hospitalization No Has patient had a PCN reaction occurring within the last 10 years: No If all of the above answers are "NO", then may proceed with Cephalosporin use.   . Decadron [Dexamethasone] Itching    PAST MEDICAL HISTORY: Past Medical History:  Diagnosis Date  . Abnormal TSH 02/2016  . Anxiety   . Cervical disc disease   . Chronic combined systolic and diastolic CHF (congestive heart failure) ([place])    a. 01/2016 Echo: EF 25%, inf AK, diffuse sev HK, Gr1 DD, mild MR.  . Depression   . Hyperglycemia   . Hypertension   . Hypertensive  heart disease   . Myocardial infarction [facility]    ??  maybe  . NICM (nonischemic cardiomyopathy) ([place])    a. 01/2016 Echo: EF 25%, inf AK, diffuse sev HK, Gr1 DD, mild MR; b. 01/2016 MV: EF 22%, no isch/infarct;  c. 02/2016 Cath: Nl cors, EF 35-45%.  . Pre-diabetes   . Seizures ([place])    with penicillin    MEDICATIONS AT HOME: Prior to Admission medications   Medication Sig Start Date End Date Taking? Authorizing Provider  acetaminophen (TYLENOL) 500 MG tablet Take 500 mg by mouth every 6 (six) hours as needed for mild pain.    Yes [provider]  amitriptyline (ELAVIL) 25 MG tablet TAKE 1 TABLET(25 MG) BY MOUTH AT BEDTIME AS NEEDED FOR SLEEP 06/23/19  Yes Ladell Pier, MD  atorvastatin (LIPITOR) 20 MG tablet Take 1 tablet (20 mg total) by mouth daily. 05/15/19  Yes Ladell Pier, MD  carvedilol (COREG) 25 MG tablet Take 1 tablet (25 mg total) by mouth 2 (two) times daily with a meal. 12/29/18  Yes Camnitz, Ocie Doyne, MD  cholecalciferol (VITAMIN D) 1000 units tablet Take 1,000 Units by mouth daily.   Yes [provider]  ELIQUIS 5 MG TABS tablet TAKE 1 TABLET(5 MG) BY MOUTH TWICE DAILY 01/18/19  Yes Bensimhon, Shaune Pascal, MD  furosemide (LASIX) 40 MG tablet Take 1 tablet (40 mg total) by mouth daily as needed for fluid (if weight gain  more than 3 punds). Patient taking differently: Take 40 mg by mouth daily.  05/14/18 08/04/19 Yes Ghimire, Henreitta Leber, MD  hydrALAZINE (APRESOLINE) 50 MG tablet TAKE 1 TABLET(50 MG) BY MOUTH THREE TIMES DAILY. FOLLOW UP APPOINTMENT PLEASE 08/02/19  Yes Lorretta Harp, MD  metFORMIN (GLUCOPHAGE) 500 MG tablet Take 1 tablet (500 mg total) by mouth daily with breakfast. 05/13/19  Yes Ladell Pier, MD  sacubitril-valsartan (ENTRESTO) 97-103 MG Take 1 tablet by mouth 2 (two) times daily. 03/25/19  Yes Nicolena Schurman, Dionne Bucy, PA-C  sildenafil (VIAGRA) 50 MG tablet TAKE 1 TABLET BY MOUTH EVERY DAIY AS NEEDED FOR ERECTILE DYSFUNCTION 03/25/19  Yes  Donaciano Range M, PA-C  spironolactone (ALDACTONE) 25 MG tablet Take 0.5 tablets (12.5 mg total) by mouth daily. 06/03/19  Yes Bensimhon, Shaune Pascal, MD  Tafamidis Meglumine, Cardiac, (VYNDAQEL) 20 MG CAPS Take 80 mg by mouth daily. 05/21/19  Yes Bensimhon, Shaune Pascal, MD  methocarbamol (ROBAXIN) 500 MG tablet Take 1 tablet (500 mg total) by mouth every 8 (eight) hours as needed for muscle spasms. 08/04/19   Argentina Donovan, PA-C     Objective:  EXAM:   Vitals:   08/04/19 0910  BP: 98/64  Pulse: 96  Temp: 97.6 F (36.4 C)  TempSrc: Oral  SpO2: 98%  Weight: 171 lb 3.2 oz (77.7 kg)  Height: 5\' 6"  (1.676 m)    General appearance : A&OX3. NAD. Non-toxic-appearing HEENT: Atraumatic and Normocephalic.  PERRLA. EOM intact. Chest/Lungs:  Breathing-non-labored, Good air entry bilaterally, breath sounds normal without rales, rhonchi, or wheezing  CVS: S1 S2 regular, no murmurs, gallops, rubs  Extremities: Bilateral Lower Ext shows no edema, both legs are warm to touch with = pulse throughout.  No visible difference in calf size.  No erythema.  +Homan's on L.  Neg on R. Neurology:  CN II-XII grossly intact, Non focal.   Psych:  TP linear. J/I WNL. Normal speech. Appropriate eye contact and affect.  Skin:  No Rash  Data Review Lab Results  Component Value Date   HGBA1C 6.6 05/13/2019   HGBA1C 6.3 (H) 05/11/2018   HGBA1C 6.4 02/19/2017     Assessment & Plan   1. Pain of left calf H/o B PE and on Eliquis.  Likely musculoskeletal but given h/o will R/O DVT - VAS [place] LOWER EXTREMITY VENOUS (DVT); Future(today) - methocarbamol (ROBAXIN) 500 MG tablet; Take 1 tablet (500 mg total) by mouth every 8 (eight) hours as needed for muscle spasms.  Dispense: 90 tablet; Refill: 0 - Basic metabolic panel -call [phone] if CP/SOB  2. Hyponatremia - Basic metabolic panel  Patient have been counseled extensively about nutrition and exercise  Return in about 3 months (around 11/04/2019) for PCP.  The  patient was given clear instructions to go to ER or return to medical center if symptoms don't improve, worsen or new problems develop. The patient verbalized understanding. The patient was told to call to get lab results if they haven't heard anything in the next week.     Freeman Caldron, PA-C [facility] and [place], [place]   08/04/2019, 10:10 AM

## 2019-08-04 NOTE — ED Notes (Signed)
ED TO INPATIENT HANDOFF REPORT  ED Nurse Name and Phone #: Lorre Nick RN   S Name/Age/Gender Jonathan Dickson Doctor 63 y.o. male Room/Bed: WA15/WA15  Code Status   Code Status: Prior  Home/SNF/Other Home Patient oriented to: self, place, time and situation Is this baseline? Yes   Triage Complete: Triage complete  Chief Complaint leg Pain  Triage Note Pt here after completing a vascular US where the pt was told he had a blood clot behind his knee of the left side. Pt denies any other symptoms in triage.    Allergies Allergies  Allergen Reactions  . Peanut-Containing Drug Products Swelling  . Penicillins Other (See Comments)    CONVULSIONS Has patient had a PCN reaction causing immediate rash, facial/tongue/throat swelling, SOB or lightheadedness with hypotension: No Has patient had a PCN reaction causing severe rash involving mucus membranes or skin necrosis: No Has patient had a PCN reaction that required hospitalization No Has patient had a PCN reaction occurring within the last 10 years: No If all of the above answers are "NO", then may proceed with Cephalosporin use.   . Decadron [Dexamethasone] Itching    Level of Care/Admitting Diagnosis ED Disposition    ED Disposition Condition Comment   Admit  Hospital Area: [place] [[id number]]  Level of Care: Med-Surg [16]  Covid Evaluation: Asymptomatic Screening Protocol (No Symptoms)  Diagnosis: DVT (deep venous thrombosis) [facility]) [id number]  Admitting Physician: Truddie Hidden [id number]  Attending Physician: Truddie Hidden [id number]  PT Class (Do Not Modify): Observation [104]  PT Acc Code (Do Not Modify): Observation [10022]       B Medical/Surgery History Past Medical History:  Diagnosis Date  . Abnormal TSH 02/2016  . Anxiety   . Cervical disc disease   . Chronic combined systolic and diastolic CHF (congestive heart failure) ([place])    a. 01/2016 Echo: EF 25%, inf AK, diffuse sev HK, Gr1 DD, mild MR.   . Depression   . Hyperglycemia   . Hypertension   . Hypertensive heart disease   . Myocardial infarction [facility])    ??  maybe  . NICM (nonischemic cardiomyopathy) ([place])    a. 01/2016 Echo: EF 25%, inf AK, diffuse sev HK, Gr1 DD, mild MR; b. 01/2016 MV: EF 22%, no isch/infarct;  c. 02/2016 Cath: Nl cors, EF 35-45%.  . Pre-diabetes   . Seizures ([place])    with penicillin   Past Surgical History:  Procedure Laterality Date  . ANTERIOR CERVICAL DECOMP/DISCECTOMY FUSION N/A 07/10/2016   Procedure: ANTERIOR CERVICAL DECOMPRESSION FUSION CERVICAL 4-5, CERVICAL 5-6, CERVICAL 6-7 WITH INSTRUMENTATION AND ALLOGRAFT;  Surgeon: [name], MD;  Location: [place];  Service: Orthopedics;  Laterality: N/A;  . CARDIAC CATHETERIZATION N/A 03/11/2016   Procedure: Left Heart Cath and Coronary Angiography;  Surgeon: [name], MD;  Location: [place] [facility];  Service: Cardiovascular;  Laterality: N/A;  . ICD IMPLANT N/A 06/17/2018   Procedure: ICD IMPLANT;  Surgeon: [name], MD;  Location: [place] [facility];  Service: Cardiovascular;  Laterality: N/A;  . Thumb surgery Right      A IV Location/Drains/Wounds Patient Lines/Drains/Airways Status   Active Line/Drains/Airways    Name:   Placement date:   Placement time:   Site:   Days:   Incision (Closed) 06/17/18 Chest Left   06/17/18    1522     413          Intake/Output Last 24 hours No intake or output data in the  24 hours ending 08/04/19 2246  Labs/Imaging Results for orders placed or performed during the hospital encounter of 08/04/19 (from the past 48 hour(s))  Basic metabolic panel     Status: Abnormal   Collection Time: 08/04/19  6:15 PM  Result Value Ref Range   Sodium 136 135 - 145 mmol/L   Potassium 4.2 3.5 - 5.1 mmol/L   Chloride 106 98 - 111 mmol/L   CO2 21 (L) 22 - 32 mmol/L   Glucose, Bld 110 (H) 70 - 99 mg/dL   BUN 18 8 - 23 mg/dL   Creatinine, Ser 1.20 0.61 - 1.24 mg/dL   Calcium 9.7 8.9 - 10.3 mg/dL   GFR  calc non Af Amer >60 >60 mL/min   GFR calc Af Amer >60 >60 mL/min   Anion gap 9 5 - 15    Comment: Performed at [facility], [place] [address], [place], [place] [address]  CBC with Differential     Status: None   Collection Time: 08/04/19  6:15 PM  Result Value Ref Range   WBC 6.4 4.0 - 10.5 K/uL   RBC 5.14 4.22 - 5.81 MIL/uL   Hemoglobin 14.5 13.0 - 17.0 g/dL   HCT 45.3 39.0 - 52.0 %   MCV 88.1 80.0 - 100.0 fL   MCH 28.2 26.0 - 34.0 pg   MCHC 32.0 30.0 - 36.0 g/dL   RDW 13.9 11.5 - 15.5 %   Platelets 217 150 - 400 K/uL   nRBC 0.0 0.0 - 0.2 %   Neutrophils Relative % 47 %   Neutro Abs 3.0 1.7 - 7.7 K/uL   Lymphocytes Relative 44 %   Lymphs Abs 2.8 0.7 - 4.0 K/uL   Monocytes Relative 6 %   Monocytes Absolute 0.4 0.1 - 1.0 K/uL   Eosinophils Relative 2 %   Eosinophils Absolute 0.1 0.0 - 0.5 K/uL   Basophils Relative 1 %   Basophils Absolute 0.1 0.0 - 0.1 K/uL   Immature Granulocytes 0 %   Abs Immature Granulocytes 0.02 0.00 - 0.07 K/uL    Comment: Performed at [facility], [place] [address], [place], [place] [address]  Protime-INR     Status: None   Collection Time: 08/04/19  6:15 PM  Result Value Ref Range   Prothrombin Time 14.5 11.4 - 15.2 seconds   INR 1.1 0.8 - 1.2    Comment: (NOTE) INR goal varies based on device and disease states. Performed at [facility], [place] [address], [place], [place] [address]    Vas [place] Lower Extremity Venous (dvt)  Result Date: 08/04/2019  Lower Venous Study Indications: Pain.  Risk Factors: None identified. Anticoagulation: Eliquis. Comparison Study: No prior studies. Performing Technologist: Oliver Hum RVT  Examination Guidelines: A complete evaluation includes B-mode imaging, spectral Doppler, color Doppler, and power Doppler as needed of all accessible portions of each vessel. Bilateral testing is considered an integral part of a complete examination. Limited examinations for  reoccurring indications may be performed as noted.  +-----+---------------+---------+-----------+----------+--------------+ RIGHTCompressibilityPhasicitySpontaneityPropertiesThrombus Aging +-----+---------------+---------+-----------+----------+--------------+ CFV  Full           Yes      Yes                                 +-----+---------------+---------+-----------+----------+--------------+   +---------+---------------+---------+-----------+----------+--------------+ LEFT     CompressibilityPhasicitySpontaneityPropertiesThrombus Aging +---------+---------------+---------+-----------+----------+--------------+ CFV      Full  Yes      Yes                                 +---------+---------------+---------+-----------+----------+--------------+ SFJ      Full                                                        +---------+---------------+---------+-----------+----------+--------------+ FV Prox  Full                                                        +---------+---------------+---------+-----------+----------+--------------+ FV Mid   Full                                                        +---------+---------------+---------+-----------+----------+--------------+ FV DistalFull                                                        +---------+---------------+---------+-----------+----------+--------------+ PFV      Full                                                        +---------+---------------+---------+-----------+----------+--------------+ POP      Partial        Yes      Yes                  Acute          +---------+---------------+---------+-----------+----------+--------------+ PTV      Full                                                        +---------+---------------+---------+-----------+----------+--------------+ PERO     Full                                                         +---------+---------------+---------+-----------+----------+--------------+     Summary: Right: No evidence of common femoral vein obstruction. Left: Findings consistent with acute deep vein thrombosis involving the left popliteal vein. No cystic structure found in the popliteal fossa.  *See table(s) above for measurements and observations. Electronically signed by Curt Jews MD on 08/04/2019 at 6:26:14 PM.    Final     Pending Labs Unresulted Labs (From admission, onward)    Start     Ordered   Signed  and Held  HIV Antibody (routine testing w rflx)  (HIV Antibody (Routine testing w reflex) panel)  Once,   R     Signed and Held   Signed and Held  CBC  (enoxaparin (LOVENOX)    CrCl >/= 30 ml/min)  Once,   R    Comments: Baseline for enoxaparin therapy IF NOT ALREADY DRAWN.  Notify MD if PLT < 100 K.    Signed and Held   Signed and Held  Creatinine, serum  (enoxaparin (LOVENOX)    CrCl >/= 30 ml/min)  Once,   R    Comments: Baseline for enoxaparin therapy IF NOT ALREADY DRAWN.    Signed and Held   Signed and Held  Creatinine, serum  (enoxaparin (LOVENOX)    CrCl >/= 30 ml/min)  Weekly,   R    Comments: while on enoxaparin therapy    Signed and Held   Signed and Held  Basic metabolic panel  Tomorrow morning,   R     Signed and Held   Signed and Held  Comprehensive metabolic panel  Tomorrow morning,   R     Signed and Held          Vitals/Pain Today's Vitals   08/04/19 1711 08/04/19 1800 08/04/19 1952 08/04/19 2205  BP: 120/84 135/88 (!) 141/91 131/90  Pulse: 90 94 85 82  Resp: (!) 23 (!) 22 (!) 22 (!) 21  Temp: 97.7 F (36.5 C)     TempSrc: Oral     SpO2: 100% 98% 98% 98%  Weight:      Height:      PainSc: 4    4     Isolation Precautions No active isolations  Medications Medications  enoxaparin (LOVENOX) injection 80 mg (80 mg Subcutaneous Given 08/04/19 2207)    Mobility walks Low fall risk   Focused Assessments Cardiac Assessment Handoff:    Lab Results   Component Value Date   TROPONINI 0.48 ([place]) 05/11/2018   No results found for: DDIMER Does the Patient currently have chest pain? No  , Pulmonary Assessment Handoff:  Lung sounds:   O2 Device: Room Air        R Recommendations: See Admitting Provider Note  Report given to:   Additional Notes:  Left lower extremity difficult to palpate pedal pulse, weak upon doppler

## 2019-08-04 NOTE — H&P (Signed)
History and Physical    Jonathan Dickson [id number] DOB: 31-Mar-1956 DOA: 08/04/2019  PCP: Ladell Pier, MD  Patient coming from: Home  I have personally briefly reviewed patient's old medical records in [place]  Chief Complaint: LLE pain  HPI: Jonathan Dickson is a 63 y.o. 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  Patient reports was in his USOH up until the last few weeks.  Patient states he has noticed while he was walking for the past few weeks he would suddenly feel a sharp pain in his L. Calf, like a pulled muscle.  He states this was a new thing for him.  He states he would rest and the discomfort would eventually abate.  He states however in the past couple of weeks the frequency of the symptoms notably increased.  He did not notice any swelling in that leg.  He denies any other symptoms. He states he has been diligent with all of his medications and states he has been taking his Apixaban every day without any issues.  He denies any chest pain or shortness of breath.    Patient denies any unexpected weight loss, no fevers or chills (he states he has gained weight).  No chest pain or sob.  No issues with PO intake, normal bowel habits.  He states he has been up to date on his cancer screenings, underwent colonoscopy.  He states he has not smoked, used alcohol for over 16 years.  No drug use.  Patient lives independently.     Review of Systems: As per HPI otherwise 10 point review of systems negative.    Past Medical History:  Diagnosis Date  . Abnormal TSH 02/2016  . Anxiety   . Cervical disc disease   . Chronic combined systolic and diastolic CHF (congestive heart failure) ([place])    a. 01/2016 Echo: EF 25%, inf AK, diffuse sev HK, Gr1 DD, mild MR.  . Depression   . Hyperglycemia   . Hypertension   . Hypertensive heart disease   . Myocardial infarction [facility])     ??  maybe  . NICM (nonischemic cardiomyopathy) ([place])    a. 01/2016 Echo: EF 25%, inf AK, diffuse sev HK, Gr1 DD, mild MR; b. 01/2016 MV: EF 22%, no isch/infarct;  c. 02/2016 Cath: Nl cors, EF 35-45%.  . Pre-diabetes   . Seizures ([place])    with penicillin    Past Surgical History:  Procedure Laterality Date  . ANTERIOR CERVICAL DECOMP/DISCECTOMY FUSION N/A 07/10/2016   Procedure: ANTERIOR CERVICAL DECOMPRESSION FUSION CERVICAL 4-5, CERVICAL 5-6, CERVICAL 6-7 WITH INSTRUMENTATION AND ALLOGRAFT;  Surgeon: [name], MD;  Location: [place];  Service: Orthopedics;  Laterality: N/A;  . CARDIAC CATHETERIZATION N/A 03/11/2016   Procedure: Left Heart Cath and Coronary Angiography;  Surgeon: [name], MD;  Location: [place] [facility];  Service: Cardiovascular;  Laterality: N/A;  . ICD IMPLANT N/A 06/17/2018   Procedure: ICD IMPLANT;  Surgeon: [name], MD;  Location: [place] [facility];  Service: Cardiovascular;  Laterality: N/A;  . Thumb surgery Right      reports that he quit smoking about 16 years ago. He has a 12.00 pack-year smoking history. He has never used smokeless tobacco. He reports that he does not drink alcohol or use drugs.  Allergies  Allergen Reactions  . Peanut-Containing Drug Products Swelling  . Penicillins Other (  See Comments)    CONVULSIONS Has patient had a PCN reaction causing immediate rash, facial/tongue/throat swelling, SOB or lightheadedness with hypotension: No Has patient had a PCN reaction causing severe rash involving mucus membranes or skin necrosis: No Has patient had a PCN reaction that required hospitalization No Has patient had a PCN reaction occurring within the last 10 years: No If all of the above answers are "NO", then may proceed with Cephalosporin use.   . Decadron [Dexamethasone] Itching    Family History  Problem Relation Age of Onset  . Diabetes Mother   . Diabetes Sister      Prior to Admission medications   Medication  Sig Start Date End Date Taking? Authorizing Provider  acetaminophen (TYLENOL) 500 MG tablet Take 500 mg by mouth every 6 (six) hours as needed for mild pain.    Yes [provider]  amitriptyline (ELAVIL) 25 MG tablet TAKE 1 TABLET(25 MG) BY MOUTH AT BEDTIME AS NEEDED FOR SLEEP Patient taking differently: Take 25 mg by mouth at bedtime as needed for sleep. TAKE 1 TABLET(25 MG) BY MOUTH AT BEDTIME AS NEEDED FOR SLEEP 06/23/19  Yes Ladell Pier, MD  atorvastatin (LIPITOR) 20 MG tablet Take 1 tablet (20 mg total) by mouth daily. 05/15/19  Yes Ladell Pier, MD  carvedilol (COREG) 25 MG tablet Take 1 tablet (25 mg total) by mouth 2 (two) times daily with a meal. 12/29/18  Yes Camnitz, Ocie Doyne, MD  cholecalciferol (VITAMIN D) 1000 units tablet Take 1,000 Units by mouth daily.   Yes [provider]  ELIQUIS 5 MG TABS tablet TAKE 1 TABLET(5 MG) BY MOUTH TWICE DAILY Patient taking differently: Take 5 mg by mouth 2 (two) times daily.  01/18/19  Yes Bensimhon, Shaune Pascal, MD  furosemide (LASIX) 40 MG tablet Take 1 tablet (40 mg total) by mouth daily as needed for fluid (if weight gain more than 3 punds). Patient taking differently: Take 40 mg by mouth daily.  05/14/18 08/04/19 Yes Ghimire, Henreitta Leber, MD  hydrALAZINE (APRESOLINE) 50 MG tablet TAKE 1 TABLET(50 MG) BY MOUTH THREE TIMES DAILY. FOLLOW UP APPOINTMENT PLEASE Patient taking differently: Take 50 mg by mouth 3 (three) times daily.  08/02/19  Yes Lorretta Harp, MD  metFORMIN (GLUCOPHAGE) 500 MG tablet Take 1 tablet (500 mg total) by mouth daily with breakfast. 05/13/19  Yes Ladell Pier, MD  methocarbamol (ROBAXIN) 500 MG tablet Take 1 tablet (500 mg total) by mouth every 8 (eight) hours as needed for muscle spasms. 08/04/19  Yes McClung, Angela M, PA-C  sacubitril-valsartan (ENTRESTO) 97-103 MG Take 1 tablet by mouth 2 (two) times daily. 03/25/19  Yes McClung, Dionne Bucy, PA-C  sildenafil (VIAGRA) 50 MG tablet TAKE 1 TABLET  BY MOUTH EVERY DAIY AS NEEDED FOR ERECTILE DYSFUNCTION 03/25/19  Yes McClung, Angela M, PA-C  spironolactone (ALDACTONE) 25 MG tablet Take 0.5 tablets (12.5 mg total) by mouth daily. 06/03/19  Yes Bensimhon, Shaune Pascal, MD  Tafamidis Meglumine, Cardiac, (VYNDAQEL) 20 MG CAPS Take 80 mg by mouth daily. 05/21/19  Yes Bensimhon, Shaune Pascal, MD    Physical Exam: Vitals:   08/04/19 1345 08/04/19 1711 08/04/19 1800 08/04/19 1952  BP:  120/84 135/88 (!) 141/91  Pulse:  90 94 85  Resp:  (!) 23 (!) 22 (!) 22  Temp:  97.7 F (36.5 C)    TempSrc:  Oral    SpO2:  100% 98% 98%  Weight: 77.6 kg     Height: 5\' 6"  (  1.676 m)       Constitutional: NAD, calm, comfortable Vitals:   08/04/19 1345 08/04/19 1711 08/04/19 1800 08/04/19 1952  BP:  120/84 135/88 (!) 141/91  Pulse:  90 94 85  Resp:  (!) 23 (!) 22 (!) 22  Temp:  97.7 F (36.5 C)    TempSrc:  Oral    SpO2:  100% 98% 98%  Weight: 77.6 kg     Height: 5\' 6"  (1.676 m)      General: pleasant, resting comfortably Eyes: PERRL, lids and conjunctivae normal ENMT: Mucous membranes are moist. Posterior pharynx clear of any exudate or lesions.Normal dentition.  Neck: normal, supple, no masses, no thyromegaly Respiratory: clear to auscultation bilaterally, no wheezing, no crackles. Normal respiratory effort. No accessory muscle use.  Cardiovascular: Regular rate and rhythm, no murmurs / rubs / gallops. No extremity edema. 2+ pedal pulses.  Abdomen: no tenderness, no masses palpated. No hepatosplenomegaly. Bowel sounds positive.  Musculoskeletal: no clubbing / cyanosis. No joint deformity upper and lower extremities. Good ROM, no contractures. Normal muscle tone. No swelling Skin: no rashes, lesions, ulcers. No induration Neurologic: CN 2-12 grossly intact. Sensation intact, Strength 5/5 in all 4.  Psychiatric: Normal judgment and insight. Alert and oriented x 3. Normal mood.    Labs on Admission: I have personally reviewed following labs and imaging  studies  CBC: Recent Labs  Lab 08/04/19 1815  WBC 6.4  NEUTROABS 3.0  HGB 14.5  HCT 45.3  MCV 88.1  PLT [id number]   Basic Metabolic Panel: Recent Labs  Lab 08/04/19 1815  NA 136  K 4.2  CL 106  CO2 21*  GLUCOSE 110*  BUN 18  CREATININE 1.20  CALCIUM 9.7   GFR: Estimated Creatinine Clearance: 61 mL/min (by C-G formula based on SCr of 1.2 mg/dL). Liver Function Tests: No results for input(s): AST, ALT, ALKPHOS, BILITOT, PROT, ALBUMIN in the last 168 hours. No results for input(s): LIPASE, AMYLASE in the last 168 hours. No results for input(s): AMMONIA in the last 168 hours. Coagulation Profile: Recent Labs  Lab 08/04/19 1815  INR 1.1   Cardiac Enzymes: No results for input(s): CKTOTAL, CKMB, CKMBINDEX, TROPONINI in the last 168 hours. BNP (last 3 results) No results for input(s): PROBNP in the last 8760 hours. HbA1C: No results for input(s): HGBA1C in the last 72 hours. CBG: No results for input(s): GLUCAP in the last 168 hours. Lipid Profile: No results for input(s): CHOL, HDL, LDLCALC, TRIG, CHOLHDL, LDLDIRECT in the last 72 hours. Thyroid Function Tests: No results for input(s): TSH, T4TOTAL, FREET4, T3FREE, THYROIDAB in the last 72 hours. Anemia Panel: No results for input(s): VITAMINB12, FOLATE, FERRITIN, TIBC, IRON, RETICCTPCT in the last 72 hours. Urine analysis:    Component Value Date/Time   COLORURINE YELLOW 10/01/2018 2222   APPEARANCEUR CLEAR 10/01/2018 2222   LABSPEC 1.029 10/01/2018 2222   PHURINE 6.0 10/01/2018 2222   GLUCOSEU NEGATIVE 10/01/2018 2222   HGBUR NEGATIVE 10/01/2018 2222   BILIRUBINUR NEGATIVE 10/01/2018 2222   KETONESUR NEGATIVE 10/01/2018 2222   PROTEINUR NEGATIVE 10/01/2018 2222   NITRITE NEGATIVE 10/01/2018 2222   LEUKOCYTESUR NEGATIVE 10/01/2018 2222    Radiological Exams on Admission: Vas [place] Lower Extremity Venous (dvt)  Result Date: 08/04/2019  Lower Venous Study Indications: Pain.  Risk Factors: None identified.  Anticoagulation: Eliquis. Comparison Study: No prior studies. Performing Technologist: Oliver Hum RVT  Examination Guidelines: A complete evaluation includes B-mode imaging, spectral Doppler, color Doppler, and power Doppler as needed of all accessible  portions of each vessel. Bilateral testing is considered an integral part of a complete examination. Limited examinations for reoccurring indications may be performed as noted.  +-----+---------------+---------+-----------+----------+--------------+ RIGHTCompressibilityPhasicitySpontaneityPropertiesThrombus Aging +-----+---------------+---------+-----------+----------+--------------+ CFV  Full           Yes      Yes                                 +-----+---------------+---------+-----------+----------+--------------+   +---------+---------------+---------+-----------+----------+--------------+ LEFT     CompressibilityPhasicitySpontaneityPropertiesThrombus Aging +---------+---------------+---------+-----------+----------+--------------+ CFV      Full           Yes      Yes                                 +---------+---------------+---------+-----------+----------+--------------+ SFJ      Full                                                        +---------+---------------+---------+-----------+----------+--------------+ FV Prox  Full                                                        +---------+---------------+---------+-----------+----------+--------------+ FV Mid   Full                                                        +---------+---------------+---------+-----------+----------+--------------+ FV DistalFull                                                        +---------+---------------+---------+-----------+----------+--------------+ PFV      Full                                                        +---------+---------------+---------+-----------+----------+--------------+ POP       Partial        Yes      Yes                  Acute          +---------+---------------+---------+-----------+----------+--------------+ PTV      Full                                                        +---------+---------------+---------+-----------+----------+--------------+ PERO     Full                                                        +---------+---------------+---------+-----------+----------+--------------+  Summary: Right: No evidence of common femoral vein obstruction. Left: Findings consistent with acute deep vein thrombosis involving the left popliteal vein. No cystic structure found in the popliteal fossa.  *See table(s) above for measurements and observations. Electronically signed by Curt Jews MD on 08/04/2019 at 6:26:14 PM.    Final     EKG: Independently reviewed.   Assessment/Plan Jonathan Dickson is a 63 y.o. 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  # Acute LLE DVT while on anticoagulation - patient reports compliance, no hx of malignancy, no signs/symptoms and has been getting appropriate screening - Venous doppler confirming new DVT - stopped apixaban and placed on therapeutic lovenox - patient likely to have need for indefinite anticoagulation warranting secondary work-up including genetic testing - recommend Hematology in AM regarding which agent to switch to (I.e. coumadin?) and for out-patient follow-up; patient is already being followed by Dr. Irene Limbo for his prior DVT/PE, unprovoked  # NICMP # Cardiac amyloidosis - patient follows with Dr. [name] - appointment scheduled for Friday - continue entresto, tafamidis, hydralazine, coreg, lasix, spironolactone  # T2DM - held oral agent, on ISS and accuchecks  # HTN - continue carvedilol, hydralazine  # HLD - continue atorvastatin  #  Insomnia - takes prn elavil qHS   DVT prophylaxis: Therapeutic Lovenox Code Status: Full Disposition Plan: home with close follow-up Consults called: Recommend Hematology consult in AM Admission status: Observation   Truddie Hidden MD [facility] Pager [phone]  If 7PM-7AM, please contact night-coverage [email or web address] Password [facility]  08/04/2019, 9:16 PM

## 2019-08-04 NOTE — ED Triage Notes (Signed)
Pt here after completing a vascular US where the pt was told he had a blood clot behind his knee of the left side. Pt denies any other symptoms in triage.

## 2019-08-04 NOTE — Progress Notes (Signed)
Left lower extremity venous duplex has been completed. Preliminary results can be found in CV Proc through chart review.  Unable to reach anyone at [facility]. The patient was brought to [facility] ED for assessment.  08/04/19 1:16 PM Carlos Levering RVT

## 2019-08-04 NOTE — ED Provider Notes (Signed)
[facility] DEPT Provider Note   CSN: [id number] Arrival date & time: 08/04/19  1315     History   Chief Complaint Chief Complaint  Patient presents with   Leg Pain    blood clot    HPI Jonathan Dickson is a 63 y.o. male.     Patient is a 69 year old gentleman with past medical history that is significant for bilateral pulmonary embolism in August 2019, on Eliquis, ICD placement in September 2019, CHF, hypertension, hyperlipidemia presenting to the emergency department for right lower extremity pain and DVT.  Patient has had left lower extremity pain and cramping and swelling for the last 1 to 2 weeks.  Had an outpatient DVT study today which was positive showing findings consistent with acute deep vein thrombosis involving the left popliteal vein. No cystic structure found in the popliteal fossa.  He reports that he has been taking his medication exactly as prescribed and has not missed any doses.  He denies any shortness of breath, chest pain, dyspnea on exertion.     Past Medical History:  Diagnosis Date   Abnormal TSH 02/2016   Anxiety    Cervical disc disease    Chronic combined systolic and diastolic CHF (congestive heart failure) ([place])    a. 01/2016 Echo: EF 25%, inf AK, diffuse sev HK, Gr1 DD, mild MR.   Depression    Hyperglycemia    Hypertension    Hypertensive heart disease    Myocardial infarction [facility])    ??  maybe   NICM (nonischemic cardiomyopathy) ([place])    a. 01/2016 Echo: EF 25%, inf AK, diffuse sev HK, Gr1 DD, mild MR; b. 01/2016 MV: EF 22%, no isch/infarct;  c. 02/2016 Cath: Nl cors, EF 35-45%.   Pre-diabetes    Seizures ([place])    with penicillin    Patient Active Problem List   Diagnosis Date Noted   Hypertensive retinopathy 06/30/2019   Pain due to onychomycosis of toenails of both feet 05/28/2019   Hyperlipidemia associated with type 2 diabetes mellitus ([place]) 05/15/2019   Type 2 diabetes mellitus without  complication, without long-term current use of insulin ([place]) 05/13/2019   Cardiac amyloidosis ([place]) [id number]   Chronic systolic heart failure ([place]) 06/17/2018   Bilateral pulmonary embolism ([place]) 05/12/2018   Heart failure ([place]) 05/11/2018   Claudication in peripheral vascular disease ([place]) 12/31/2017   Decreased hearing of both ears 11/10/2017   Cervical disc disease with myelopathy 07/10/2016   NICM (nonischemic cardiomyopathy) ([place]) 03/12/2016   Congestive dilated cardiomyopathy ([place]) 03/11/2016   Cervical radiculitis 04/19/2015   Essential hypertension 09/27/2014    Past Surgical History:  Procedure Laterality Date   ANTERIOR CERVICAL DECOMP/DISCECTOMY FUSION N/A 07/10/2016   Procedure: ANTERIOR CERVICAL DECOMPRESSION FUSION CERVICAL 4-5, CERVICAL 5-6, CERVICAL 6-7 WITH INSTRUMENTATION AND ALLOGRAFT;  Surgeon: [name], MD;  Location: [facility];  Service: Orthopedics;  Laterality: N/A;   CARDIAC CATHETERIZATION N/A 03/11/2016   Procedure: Left Heart Cath and Coronary Angiography;  Surgeon: [name], MD;  Location: [facility] CV [facility];  Service: Cardiovascular;  Laterality: N/A;   ICD IMPLANT N/A 06/17/2018   Procedure: ICD IMPLANT;  Surgeon: [name], MD;  Location: [facility];  Service: Cardiovascular;  Laterality: N/A;   Thumb surgery Right         Home Medications    Prior to Admission medications   Medication Sig Start Date End Date Taking? Authorizing Provider  acetaminophen (TYLENOL) 500 MG tablet Take 500  mg by mouth every 6 (six) hours as needed for mild pain.     [provider]  amitriptyline (ELAVIL) 25 MG tablet TAKE 1 TABLET(25 MG) BY MOUTH AT BEDTIME AS NEEDED FOR SLEEP 06/23/19   Ladell Pier, MD  atorvastatin (LIPITOR) 20 MG tablet Take 1 tablet (20 mg total) by mouth daily. 05/15/19   Ladell Pier, MD  carvedilol (COREG) 25 MG tablet Take 1 tablet (25 mg total) by mouth 2 (two) times daily with a  meal. 12/29/18   Camnitz, Ocie Doyne, MD  cholecalciferol (VITAMIN D) 1000 units tablet Take 1,000 Units by mouth daily.    [provider]  ELIQUIS 5 MG TABS tablet TAKE 1 TABLET(5 MG) BY MOUTH TWICE DAILY 01/18/19   Bensimhon, Shaune Pascal, MD  furosemide (LASIX) 40 MG tablet Take 1 tablet (40 mg total) by mouth daily as needed for fluid (if weight gain more than 3 punds). Patient taking differently: Take 40 mg by mouth daily.  05/14/18 08/04/19  Ghimire, Henreitta [place], MD  hydrALAZINE (APRESOLINE) 50 MG tablet TAKE 1 TABLET(50 MG) BY MOUTH THREE TIMES DAILY. FOLLOW UP APPOINTMENT PLEASE 08/02/19   Lorretta Harp, MD  metFORMIN (GLUCOPHAGE) 500 MG tablet Take 1 tablet (500 mg total) by mouth daily with breakfast. 05/13/19   Ladell Pier, MD  methocarbamol (ROBAXIN) 500 MG tablet Take 1 tablet (500 mg total) by mouth every 8 (eight) hours as needed for muscle spasms. 08/04/19   Argentina Donovan, PA-C  sacubitril-valsartan (ENTRESTO) 97-103 MG Take 1 tablet by mouth 2 (two) times daily. 03/25/19   Argentina Donovan, PA-C  sildenafil (VIAGRA) 50 MG tablet TAKE 1 TABLET BY MOUTH EVERY DAIY AS NEEDED FOR ERECTILE DYSFUNCTION 03/25/19   Argentina Donovan, PA-C  spironolactone (ALDACTONE) 25 MG tablet Take 0.5 tablets (12.5 mg total) by mouth daily. 06/03/19   Bensimhon, Shaune Pascal, MD  Tafamidis Meglumine, Cardiac, (VYNDAQEL) 20 MG CAPS Take 80 mg by mouth daily. 05/21/19   Bensimhon, Shaune Pascal, MD    Family History Family History  Problem Relation Age of Onset   Diabetes Mother    Diabetes Sister     Social History Social History   Tobacco Use   Smoking status: Former Smoker    Packs/day: 1.50    Years: 8.00    Pack years: 12.00    Quit date: 03/15/2003    Years since quitting: 16.4   Smokeless tobacco: Never Used  Substance Use Topics   Alcohol use: No    Comment: none since 2004   Drug use: No    Comment: former  none since 2004     Allergies   Peanut-containing drug  products, Penicillins, and Decadron [dexamethasone]   Review of Systems Review of Systems  Constitutional: Negative for chills and fever.  Respiratory: Negative for cough and shortness of breath.   Cardiovascular: Positive for leg swelling. Negative for chest pain.  Musculoskeletal: Positive for arthralgias and myalgias.  Neurological: Negative for dizziness.  Hematological: Does not bruise/bleed easily.     Physical Exam Updated Vital Signs BP 135/88    Pulse 94    Temp 97.7 F (36.5 C) (Oral)    Resp (!) 22    Ht 5\' 6"  (1.676 m)    Wt 77.6 kg    SpO2 98%    BMI 27.60 kg/m   Physical Exam Vitals signs and nursing note reviewed.  Constitutional:      Appearance: Normal appearance.  HENT:  Head: Normocephalic.  Eyes:     Conjunctiva/sclera: Conjunctivae normal.  Pulmonary:     Effort: Pulmonary effort is normal.  Musculoskeletal:     Comments: Bilateral normal distal pulses and sensation.  Mild swelling to the left lower extremity with tenderness to palpation in the calf.  Skin:    General: Skin is dry.     Capillary Refill: Capillary refill takes less than 2 seconds.  Neurological:     Mental Status: He is alert.  Psychiatric:        Mood and Affect: Mood normal.      ED Treatments / Results  Labs (all labs ordered are listed, but only abnormal results are displayed) Labs Reviewed  CBC WITH DIFFERENTIAL/PLATELET  BASIC METABOLIC PANEL  PROTIME-INR    EKG None  Radiology Vas [place] Lower Extremity Venous (dvt)  Result Date: 08/04/2019  Lower Venous Study Indications: Pain.  Risk Factors: None identified. Anticoagulation: Eliquis. Comparison Study: No prior studies. Performing Technologist: Oliver Hum RVT  Examination Guidelines: A complete evaluation includes B-mode imaging, spectral Doppler, color Doppler, and power Doppler as needed of all accessible portions of each vessel. Bilateral testing is considered an integral part of a complete examination.  Limited examinations for reoccurring indications may be performed as noted.  +-----+---------------+---------+-----------+----------+--------------+  RIGHT Compressibility Phasicity Spontaneity Properties Thrombus Aging  +-----+---------------+---------+-----------+----------+--------------+  CFV   Full            Yes       Yes                                    +-----+---------------+---------+-----------+----------+--------------+   +---------+---------------+---------+-----------+----------+--------------+  LEFT      Compressibility Phasicity Spontaneity Properties Thrombus Aging  +---------+---------------+---------+-----------+----------+--------------+  CFV       Full            Yes       Yes                                    +---------+---------------+---------+-----------+----------+--------------+  SFJ       Full                                                             +---------+---------------+---------+-----------+----------+--------------+  FV Prox   Full                                                             +---------+---------------+---------+-----------+----------+--------------+  FV Mid    Full                                                             +---------+---------------+---------+-----------+----------+--------------+  FV Distal Full                                                             +---------+---------------+---------+-----------+----------+--------------+  PFV       Full                                                             +---------+---------------+---------+-----------+----------+--------------+  POP       Partial         Yes       Yes                    Acute           +---------+---------------+---------+-----------+----------+--------------+  PTV       Full                                                             +---------+---------------+---------+-----------+----------+--------------+  PERO      Full                                                              +---------+---------------+---------+-----------+----------+--------------+     Summary: Right: No evidence of common femoral vein obstruction. Left: Findings consistent with acute deep vein thrombosis involving the left popliteal vein. No cystic structure found in the popliteal fossa.  *See table(s) above for measurements and observations. Electronically signed by Curt Jews MD on 08/04/2019 at 6:26:14 PM.    Final     Procedures Procedures (including critical care time)  Medications Ordered in ED Medications - No data to display   Initial Impression / Assessment and Plan / ED Course  I have reviewed the triage vital signs and the nursing notes.  Pertinent labs & imaging results that were available during my care of the patient were reviewed by me and considered in my medical decision making (see chart for details).  Clinical Course as of Aug 03 1830  Wed Aug 04, 2019  1829 Spoke with hospitalist at this time to admit the patient for failed outpatient treatment with Eliquis with new DVT.   [KM]    Clinical Course User Index [KM] Alveria Apley, PA-C       The patient appears reasonably stabilized for admission considering the current resources, flow, and capabilities available in the ED at this time, and I doubt any other [facility] requiring further screening and/or treatment in the ED prior to admission.     Final Clinical Impressions(s) / ED Diagnoses   Final diagnoses:  Acute deep vein thrombosis (DVT) of left peroneal vein [facility])  Inadequate anticoagulation    ED Discharge Orders    None       Kristine Royal 08/04/19 1831    Milton Ferguson, MD 08/04/19 2250

## 2019-08-05 DIAGNOSIS — M79606 Pain in leg, unspecified: Secondary | ICD-10-CM

## 2019-08-05 LAB — COMPREHENSIVE METABOLIC PANEL
ALT: 20 U/L (ref 0–44)
AST: 22 U/L (ref 15–41)
Albumin: 3.8 g/dL (ref 3.5–5.0)
Alkaline Phosphatase: 59 U/L (ref 38–126)
Anion gap: 10 (ref 5–15)
BUN: 18 mg/dL (ref 8–23)
CO2: 18 mmol/L — ABNORMAL LOW (ref 22–32)
Calcium: 9.3 mg/dL (ref 8.9–10.3)
Chloride: 106 mmol/L (ref 98–111)
Creatinine, Ser: 1.07 mg/dL (ref 0.61–1.24)
GFR calc Af Amer: >60 mL/min (ref >60)
GFR calc non Af Amer: >60 mL/min (ref >60)
Glucose, Bld: 106 mg/dL — ABNORMAL HIGH (ref 70–99)
Potassium: 4.2 mmol/L (ref 3.5–5.1)
Sodium: 134 mmol/L — ABNORMAL LOW (ref 135–145)
Total Bilirubin: 0.8 mg/dL (ref 0.3–1.2)
Total Protein: 7 g/dL (ref 6.5–8.1)

## 2019-08-05 LAB — BASIC METABOLIC PANEL
BUN/Creatinine Ratio: 14 (ref 10–24)
BUN: 17 mg/dL (ref 8–27)
CO2: 15 mmol/L — ABNORMAL LOW (ref 20–29)
Calcium: 10.1 mg/dL (ref 8.6–10.2)
Chloride: 103 mmol/L (ref 96–106)
Creatinine, Ser: 1.23 mg/dL (ref 0.76–1.27)
GFR calc Af Amer: 71 mL/min/{1.73_m2} (ref >59)
GFR calc non Af Amer: 62 mL/min/{1.73_m2} (ref >59)
Glucose: 112 mg/dL — ABNORMAL HIGH (ref 65–99)
Potassium: 5.2 mmol/L (ref 3.5–5.2)
Sodium: 136 mmol/L (ref 134–144)

## 2019-08-05 LAB — GLUCOSE, CAPILLARY
Glucose-Capillary: 111 mg/dL — ABNORMAL HIGH (ref 70–99)
Glucose-Capillary: 118 mg/dL — ABNORMAL HIGH (ref 70–99)

## 2019-08-05 LAB — PROTIME-INR
INR: 1.2 (ref 0.8–1.2)
Prothrombin Time: 14.6 seconds (ref 11.4–15.2)

## 2019-08-05 LAB — HIV ANTIBODY (ROUTINE TESTING W REFLEX): HIV Screen 4th Generation wRfx: NONREACTIVE

## 2019-08-05 MED ORDER — WARFARIN - PHARMACIST DOSING INPATIENT: Freq: Every day | Status: DC

## 2019-08-05 MED ORDER — ENOXAPARIN SODIUM 80 MG/0.8ML ~~LOC~~ SOLN: 80.0000 mg | Freq: Two times a day (BID) | SUBCUTANEOUS | 0 refills | Status: DC

## 2019-08-05 MED ORDER — WARFARIN SODIUM 5 MG PO TABS: 10.0000 mg | ORAL_TABLET | Freq: Once | ORAL | Status: DC

## 2019-08-05 MED ORDER — WARFARIN SODIUM 10 MG PO TABS: 5.0000 mg | ORAL_TABLET | Freq: Every day | ORAL | 0 refills | Status: DC

## 2019-08-05 NOTE — Progress Notes (Signed)
Patient wanted to leave AMA. Paged MD and had a phone call back. Patient verbally understood after RN explained about AMA and patient signed AMA form.

## 2019-08-05 NOTE — Progress Notes (Signed)
ANTICOAGULATION CONSULT NOTE - Initial Consult  Pharmacy Consult for warfarin Indication: VTE treatment  Allergies  Allergen Reactions  . Peanut-Containing Drug Products Swelling  . Penicillins Other (See Comments)    CONVULSIONS Has patient had a PCN reaction causing immediate rash, facial/tongue/throat swelling, SOB or lightheadedness with hypotension: No Has patient had a PCN reaction causing severe rash involving mucus membranes or skin necrosis: No Has patient had a PCN reaction that required hospitalization No Has patient had a PCN reaction occurring within the last 10 years: No If all of the above answers are "NO", then may proceed with Cephalosporin use.   . Decadron [Dexamethasone] Itching    Patient Measurements: Height: 5\' 6"  (167.6 cm) Weight: 171 lb (77.6 kg) IBW/kg (Calculated) : 63.8   Vital Signs: Temp: 98.1 F (36.7 C) (11/05 0622) Temp Source: Oral (11/05 0622) BP: 112/79 (11/05 0622) Pulse Rate: 88 (11/05 0622)  Labs: Recent Labs    08/04/19 0958 08/04/19 1815 08/05/19 0543  HGB  --  14.5  --   HCT  --  45.3  --   PLT  --  217  --   LABPROT  --  14.5  --   INR  --  1.1  --   CREATININE 1.23 1.20 1.07    Estimated Creatinine Clearance: 68.4 mL/min (by C-G formula based on SCr of 1.07 mg/dL).   Medical History: Past Medical History:  Diagnosis Date  . Abnormal TSH 02/2016  . Anxiety   . Cervical disc disease   . Chronic combined systolic and diastolic CHF (congestive heart failure) ([place])    a. 01/2016 Echo: EF 25%, inf AK, diffuse sev HK, Gr1 DD, mild MR.  . Depression   . Hyperglycemia   . Hypertension   . Hypertensive heart disease   . Myocardial infarction [facility])    ??  maybe  . NICM (nonischemic cardiomyopathy) ([place])    a. 01/2016 Echo: EF 25%, inf AK, diffuse sev HK, Gr1 DD, mild MR; b. 01/2016 MV: EF 22%, no isch/infarct;  c. 02/2016 Cath: Nl cors, EF 35-45%.  . Pre-diabetes   . Seizures ([place])    with penicillin      Assessment: 63 y.o. 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 Found to have new DVT. Pharmacy consulted to dose warfarin.  08/05/2019 INR 1.1 CBC WNL Scr 1.07  Goal of Therapy:  INR 2-3    Plan:  Warfarin 10mg  po today at 1800 Continue therapeutic enoxaparin per MD Daily INR  Dolly Rias RPh 08/05/2019, 11:02 AM

## 2019-08-06 ENCOUNTER — Telehealth: Payer: Self-pay | Admitting: Internal Medicine

## 2019-08-06 ENCOUNTER — Ambulatory Visit (INDEPENDENT_AMBULATORY_CARE_PROVIDER_SITE_OTHER): Payer: Self-pay | Admitting: Pharmacist Clinician (PhC)/ Clinical Pharmacy Specialist

## 2019-08-06 ENCOUNTER — Encounter: Payer: Self-pay | Admitting: Cardiovascular Disease

## 2019-08-06 ENCOUNTER — Other Ambulatory Visit: Payer: Self-pay

## 2019-08-06 ENCOUNTER — Ambulatory Visit (INDEPENDENT_AMBULATORY_CARE_PROVIDER_SITE_OTHER): Payer: Medicaid Other | Admitting: Cardiovascular Disease

## 2019-08-06 DIAGNOSIS — Z7901 Long term (current) use of anticoagulants: Secondary | ICD-10-CM | POA: Insufficient documentation

## 2019-08-06 DIAGNOSIS — I420 Dilated cardiomyopathy: Secondary | ICD-10-CM

## 2019-08-06 DIAGNOSIS — I2699 Other pulmonary embolism without acute cor pulmonale: Secondary | ICD-10-CM

## 2019-08-06 DIAGNOSIS — I824Y9 Acute embolism and thrombosis of unspecified deep veins of unspecified proximal lower extremity: Secondary | ICD-10-CM

## 2019-08-06 DIAGNOSIS — I10 Essential (primary) hypertension: Secondary | ICD-10-CM

## 2019-08-06 LAB — POCT INR: INR: 1 — AB (ref 2.0–3.0)

## 2019-08-06 NOTE — Assessment & Plan Note (Signed)
History of nonischemic cardiomyopathy with a normal cardiac catheterization performed by Dr. Ellyn Hack 03/11/2016 with normal coronary arteries.  He did have a PYP's test which suggested amyloid heart and has been followed by Dr. Haroldine Laws.  He is on optimal medical therapy.  His last EF was in the 25 to 30% range by 2D echo 05/11/2018.  He denies symptoms of heart failure.

## 2019-08-06 NOTE — Telephone Encounter (Signed)
Patient called stating he received a missed call from our clinic. Please follow up.

## 2019-08-06 NOTE — Patient Instructions (Signed)
Take extra 1/2 tablet Friday then take 1 tablet Saturday,  1/2 tablet Sunday and 1 tablet Monday.  Repeat INR Tuesday

## 2019-08-06 NOTE — Assessment & Plan Note (Signed)
History of essential hypertension with blood pressure measured today at 104/78.  He is on carvedilol, hydralazine and Entresto.

## 2019-08-06 NOTE — Progress Notes (Signed)
08/06/2019 [name]   Jun 04, 1956  BB:[id number]  Primary Physician Ladell Pier, MD Primary Cardiologist: Lorretta Harp MD FACP, [place], [place], [place]  HPI:  Jonathan Dickson is a 63 y.o. fit appearing widowed African-American male father of 3, grandfather to 3 grandchildren who  I last saw spoke to him on 01/22/2019 via virtual telemedicine phone visit... He was scheduled to have cervical laminectomy today but this was put off because of need for cardiac clearance.His only risk factor is hypertension. Smoking 13 years ago. He's never had a heart attack or stroke. He denies chest pain or shortness of breath. I performed 2-D echocardiography which revealed unexpectedly ejection fraction of 25% with inferior wall akinesis and severe hypokinesia otherwise. A Myoview stress test confirmed the deep diminished ejection fraction but did not show any areas of ischemia or infarction. 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 He underwent right left heart cath by Dr. Ellyn Hack 03/11/16 confirming this. Ultimately, he is begun on carvedilol and lisinopril. His ejection fraction improved up to 35-40% by 2-D echocardiogram performed 06/21/16. He is completely symptomatic. Based on this I cleared him forfor hiselective cervical laminectomy and mildly elevated risk.  Because of her current symptoms of shortness of breath a repeat echo was performed 01/05/2018 revealing EF of 30%.  I did refer him to Dr. Haroldine Laws. His heart failure medications were optimized.  He did have a cardiac MRI performed 04/01/2018 that showed an EF of 20% with changes consistent with cardiac amyloidosis and he was begun on medications for this.  He also had an ICD implanted by Dr. Curt Bears prophylactically 06/07/2018 and has had no discharges.  He feels clinically improved on his current medications.  He has had bilateral pulmonary emboli as well on Eliquis oral anticoagulation.  He recently complained of some left calf pain and  had a venous ultrasound done at [facility] remarkable for a left popliteal DVT which was acute.  He was hospitalized and deemed in Eliquis failure.  Lovenox was begun as was Coumadin oral anticoagulation.  Unfortunately, he left the hospital AMA yesterday.  He is apparently been compliant with his other medications.  He denies symptoms of heart failure.  He is not seen Dr. Haroldine Laws back since January.   Current Meds  Medication Sig  . acetaminophen (TYLENOL) 500 MG tablet Take 500 mg by mouth every 6 (six) hours as needed for mild pain.   Marland Kitchen amitriptyline (ELAVIL) 25 MG tablet TAKE 1 TABLET(25 MG) BY MOUTH AT BEDTIME AS NEEDED FOR SLEEP (Patient taking differently: Take 25 mg by mouth at bedtime as needed for sleep. TAKE 1 TABLET(25 MG) BY MOUTH AT BEDTIME AS NEEDED FOR SLEEP)  . atorvastatin (LIPITOR) 20 MG tablet Take 1 tablet (20 mg total) by mouth daily.  . carvedilol (COREG) 25 MG tablet Take 1 tablet (25 mg total) by mouth 2 (two) times daily with a meal.  . cholecalciferol (VITAMIN D) 1000 units tablet Take 1,000 Units by mouth daily.  Marland Kitchen enoxaparin (LOVENOX) 80 MG/0.8ML injection Inject 0.8 mLs (80 mg total) into the skin every 12 (twelve) hours for 10 days.  . furosemide (LASIX) 40 MG tablet Take 1 tablet (40 mg total) by mouth daily as needed for fluid (if weight gain more than 3 punds). (Patient taking differently: Take 40 mg by mouth daily. )  . hydrALAZINE (APRESOLINE) 50 MG tablet TAKE 1 TABLET(50 MG) BY MOUTH THREE TIMES DAILY. FOLLOW UP APPOINTMENT PLEASE (Patient taking  differently: Take 50 mg by mouth 3 (three) times daily. )  . metFORMIN (GLUCOPHAGE) 500 MG tablet Take 1 tablet (500 mg total) by mouth daily with breakfast.  . methocarbamol (ROBAXIN) 500 MG tablet Take 1 tablet (500 mg total) by mouth every 8 (eight) hours as needed for muscle spasms.  . sacubitril-valsartan (ENTRESTO) 97-103 MG Take 1 tablet by mouth 2 (two) times daily.  . sildenafil (VIAGRA) 50 MG tablet  TAKE 1 TABLET BY MOUTH EVERY DAIY AS NEEDED FOR ERECTILE DYSFUNCTION  . spironolactone (ALDACTONE) 25 MG tablet Take 0.5 tablets (12.5 mg total) by mouth daily.  . Tafamidis Meglumine, Cardiac, (VYNDAQEL) 20 MG CAPS Take 80 mg by mouth daily.  Marland Kitchen warfarin (COUMADIN) 10 MG tablet Take 0.5 tablets (5 mg total) by mouth daily at 6 PM.     Allergies  Allergen Reactions  . Peanut-Containing Drug Products Swelling  . Penicillins Other (See Comments)    CONVULSIONS Has patient had a PCN reaction causing immediate rash, facial/tongue/throat swelling, SOB or lightheadedness with hypotension: No Has patient had a PCN reaction causing severe rash involving mucus membranes or skin necrosis: No Has patient had a PCN reaction that required hospitalization No Has patient had a PCN reaction occurring within the last 10 years: No If all of the above answers are "NO", then may proceed with Cephalosporin use.   . Decadron [Dexamethasone] Itching    Social History   Socioeconomic History  . Marital status: Legally Separated    Spouse name: Not on file  . Number of children: 3  . Years of education: Not on file  . Highest education level: Not on file  Occupational History  . Not on file  Social Needs  . Financial resource strain: Not on file  . Food insecurity    Worry: Not on file    Inability: Not on file  . Transportation needs    Medical: Not on file    Non-medical: Not on file  Tobacco Use  . Smoking status: Former Smoker    Packs/day: 1.50    Years: 8.00    Pack years: 12.00    Quit date: 03/15/2003    Years since quitting: 16.4  . Smokeless tobacco: Never Used  Substance and Sexual Activity  . Alcohol use: No    Comment: none since 2004  . Drug use: No    Comment: former  none since 2004  . Sexual activity: Not Currently  Lifestyle  . Physical activity    Days per week: Not on file    Minutes per session: Not on file  . Stress: Not on file  Relationships  . Social  Herbalist on phone: Not on file    Gets together: Not on file    Attends religious service: Not on file    Active member of club or organization: Not on file    Attends meetings of clubs or organizations: Not on file    Relationship status: Not on file  . Intimate partner violence    Fear of current or ex partner: Not on file    Emotionally abused: Not on file    Physically abused: Not on file    Forced sexual activity: Not on file  Other Topics Concern  . Not on file  Social History Narrative  . Not on file     Review of Systems: General: negative for chills, fever, night sweats or weight changes.  Cardiovascular: negative for chest pain, dyspnea on exertion,  edema, orthopnea, palpitations, paroxysmal nocturnal dyspnea or shortness of breath Dermatological: negative for rash Respiratory: negative for cough or wheezing Urologic: negative for hematuria Abdominal: negative for nausea, vomiting, diarrhea, bright red blood per rectum, melena, or hematemesis Neurologic: negative for visual changes, syncope, or dizziness All other systems reviewed and are otherwise negative except as noted above.    Blood pressure 104/78, pulse 76, temperature (!) 96.8 F (36 C), height 5\' 6"  (1.676 m), weight 169 lb (76.7 kg).  General appearance: alert and no distress Neck: no adenopathy, no carotid bruit, no JVD, supple, symmetrical, trachea midline and thyroid not enlarged, symmetric, no tenderness/mass/nodules Lungs: clear to auscultation bilaterally Heart: regular rate and rhythm, S1, S2 normal, no murmur, click, rub or gallop Extremities: edema No edema Pulses: 2+ and symmetric Skin: Skin color, texture, turgor normal. No rashes or lesions Neurologic: Alert and oriented X 3, normal strength and tone. Normal symmetric reflexes. Normal coordination and gait  EKG not performed today  ASSESSMENT AND PLAN:   Essential hypertension History of essential hypertension with blood  pressure measured today at 104/78.  He is on carvedilol, hydralazine and Entresto.  Congestive dilated cardiomyopathy (HCC) History of nonischemic cardiomyopathy with a normal cardiac catheterization performed by Dr. Ellyn Hack 03/11/2016 with normal coronary arteries.  He did have a PYP's test which suggested amyloid heart and has been followed by Dr. Haroldine Laws.  He is on optimal medical therapy.  His last EF was in the 25 to 30% range by 2D echo 05/11/2018.  He denies symptoms of heart failure.  Bilateral pulmonary embolism ([place]) She had bilateral pulmonary emboli on Eliquis with recent admission for acute DVT involving the left popliteal vein 08/04/2019.  This was deemed a "Eliquis failure".  He was begun on Lovenox injections and Coumadin oral anticoagulation.  He is scheduled to see his hematologist, Dr. Irene Limbo in the upcoming future.  We will check a PT/INR today.  Hyperlipidemia associated with type 2 diabetes mellitus ([place]) History of hyperlipidemia on statin therapy with lipid profile performed 05/13/2019 revealing total cholesterol 230, LDL 131 and HDL 39.      Lorretta Harp MD [employer], [facility] 08/06/2019 11:49 AM

## 2019-08-06 NOTE — Telephone Encounter (Signed)
Will forward to octavia. Pt seen Levada Dy

## 2019-08-06 NOTE — Patient Instructions (Addendum)
Medication Instructions:  No changes *If you need a refill on your cardiac medications before your next appointment, please call your pharmacy*  Lab Work: None ordered If you have labs (blood work) drawn today and your tests are completely normal, you will receive your results only by: Marland Kitchen MyChart Message (if you have MyChart) OR . A paper copy in the mail If you have any lab test that is abnormal or we need to change your treatment, we will call you to review the results.  Testing/Procedures: Your physician has requested that you have an echocardiogram. Echocardiography is a painless test that uses sound waves to create images of your heart. It provides your doctor with information about the size and shape of your heart and how well your heart's chambers and valves are working. You may receive an ultrasound enhancing agent through an IV if needed to better visualize your heart during the echo.This procedure takes approximately one hour. There are no restrictions for this procedure. This will take place at the [address], [address].    Follow-Up: At [facility], you and your health needs are our priority.  As part of our continuing mission to provide you with exceptional heart care, we have created designated Provider Care Teams.  These Care Teams include your primary Cardiologist (physician) and Advanced Practice Providers (APPs -  Physician Assistants and Nurse Practitioners) who all work together to provide you with the care you need, when you need it.  Your next appointment:   3 months  The format for your next appointment:   In Person  Provider:   Quay Burow, MD  Other Instructions You will need a follow up appointment with Dr. Haroldine Laws

## 2019-08-06 NOTE — Assessment & Plan Note (Signed)
History of hyperlipidemia on statin therapy with lipid profile performed 05/13/2019 revealing total cholesterol 230, LDL 131 and HDL 39.

## 2019-08-06 NOTE — Discharge Summary (Signed)
Physician Discharge Summary  Jonathan Dickson [id number] DOB: 02-05-1956 DOA: 08/04/2019  PCP: Jonathan Pier, MD  Admit date: 08/04/2019 Discharge date: 08/06/2019     Patient left AMA.    Brief/Interim Summary: Jonathan Dickson is a 63 y.o. 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 HE WAS FOUND TO HAVE A acute LLE DVT while on eliquis.  His eliquis was stopped for medication failure and started on Lovenox injections and coumadin.  Discussed with Dr Irene Limbo his hematologist and recommended to continue with lovenox and coumadin and will follow up in the office.   Meanwhile patient wants to leave AMA, reports he  has an appt with cardiology in the morning and he said he will get his INR checked at the office .  Wrote for prescription for lovenox and coumadin and recommended to check INR in the cardiology office or coumadin clinic.     Discharge Diagnoses:  Active Problems:   DVT (deep venous thrombosis) (HCC)    Discharge Instructions   Allergies as of 08/05/2019      Reactions   Peanut-containing Drug Products Swelling   Penicillins Other (See Comments)   CONVULSIONS Has patient had a PCN reaction causing immediate rash, facial/tongue/throat swelling, SOB or lightheadedness with hypotension: No Has patient had a PCN reaction causing severe rash involving mucus membranes or skin necrosis: No Has patient had a PCN reaction that required hospitalization No Has patient had a PCN reaction occurring within the last 10 years: No If all of the above answers are "NO", then may proceed with Cephalosporin use.   Decadron [dexamethasone] Itching      Medication List    STOP taking these medications   Eliquis 5 MG Tabs tablet Generic drug: apixaban     TAKE these medications   acetaminophen 500 MG tablet Commonly known as: TYLENOL Take 500 mg by mouth every 6 (six) hours as  needed for mild pain.   amitriptyline 25 MG tablet Commonly known as: ELAVIL TAKE 1 TABLET(25 MG) BY MOUTH AT BEDTIME AS NEEDED FOR SLEEP What changed: See the new instructions.   atorvastatin 20 MG tablet Commonly known as: LIPITOR Take 1 tablet (20 mg total) by mouth daily.   carvedilol 25 MG tablet Commonly known as: COREG Take 1 tablet (25 mg total) by mouth 2 (two) times daily with a meal.   cholecalciferol 1000 units tablet Commonly known as: VITAMIN D Take 1,000 Units by mouth daily.   enoxaparin 80 MG/0.8ML injection Commonly known as: LOVENOX Inject 0.8 mLs (80 mg total) into the skin every 12 (twelve) hours for 10 days.   furosemide 40 MG tablet Commonly known as: Lasix Take 1 tablet (40 mg total) by mouth daily as needed for fluid (if weight gain more than 3 punds). What changed: when to take this   hydrALAZINE 50 MG tablet Commonly known as: APRESOLINE TAKE 1 TABLET(50 MG) BY MOUTH THREE TIMES DAILY. FOLLOW UP APPOINTMENT PLEASE What changed: See the new instructions.   metFORMIN 500 MG tablet Commonly known as: GLUCOPHAGE Take 1 tablet (500 mg total) by mouth daily with breakfast.   methocarbamol 500 MG tablet Commonly known as: Robaxin Take 1 tablet (500 mg total) by mouth every 8 (eight) hours as needed for muscle spasms.   warfarin 10 MG tablet Commonly known as: COUMADIN Take 0.5 tablets (5 mg total) by mouth daily at 6 PM.  ASK your doctor about these medications   sacubitril-valsartan 97-103 MG Commonly known as: ENTRESTO Take 1 tablet by mouth 2 (two) times daily.   sildenafil 50 MG tablet Commonly known as: Viagra TAKE 1 TABLET BY MOUTH EVERY DAIY AS NEEDED FOR ERECTILE DYSFUNCTION   spironolactone 25 MG tablet Commonly known as: ALDACTONE Take 0.5 tablets (12.5 mg total) by mouth daily.   Vyndaqel 20 MG Caps Generic drug: Tafamidis Meglumine (Cardiac) Take 80 mg by mouth daily.       Allergies  Allergen Reactions  .  Peanut-Containing Drug Products Swelling  . Penicillins Other (See Comments)    CONVULSIONS Has patient had a PCN reaction causing immediate rash, facial/tongue/throat swelling, SOB or lightheadedness with hypotension: No Has patient had a PCN reaction causing severe rash involving mucus membranes or skin necrosis: No Has patient had a PCN reaction that required hospitalization No Has patient had a PCN reaction occurring within the last 10 years: No If all of the above answers are "NO", then may proceed with Cephalosporin use.   . Decadron [Dexamethasone] Itching    Consultations:  Discussed with Dr Irene Limbo over the phone.    Procedures/Studies: Vas [place] Lower Extremity Venous (dvt)  Result Date: 08/04/2019  Lower Venous Study Indications: Pain.  Risk Factors: None identified. Anticoagulation: Eliquis. Comparison Study: No prior studies. Performing Technologist: Oliver Hum RVT  Examination Guidelines: A complete evaluation includes B-mode imaging, spectral Doppler, color Doppler, and power Doppler as needed of all accessible portions of each vessel. Bilateral testing is considered an integral part of a complete examination. Limited examinations for reoccurring indications may be performed as noted.  +-----+---------------+---------+-----------+----------+--------------+ RIGHTCompressibilityPhasicitySpontaneityPropertiesThrombus Aging +-----+---------------+---------+-----------+----------+--------------+ CFV  Full           Yes      Yes                                 +-----+---------------+---------+-----------+----------+--------------+   +---------+---------------+---------+-----------+----------+--------------+ LEFT     CompressibilityPhasicitySpontaneityPropertiesThrombus Aging +---------+---------------+---------+-----------+----------+--------------+ CFV      Full           Yes      Yes                                  +---------+---------------+---------+-----------+----------+--------------+ SFJ      Full                                                        +---------+---------------+---------+-----------+----------+--------------+ FV Prox  Full                                                        +---------+---------------+---------+-----------+----------+--------------+ FV Mid   Full                                                        +---------+---------------+---------+-----------+----------+--------------+ FV DistalFull                                                        +---------+---------------+---------+-----------+----------+--------------+  PFV      Full                                                        +---------+---------------+---------+-----------+----------+--------------+ POP      Partial        Yes      Yes                  Acute          +---------+---------------+---------+-----------+----------+--------------+ PTV      Full                                                        +---------+---------------+---------+-----------+----------+--------------+ PERO     Full                                                        +---------+---------------+---------+-----------+----------+--------------+     Summary: Right: No evidence of common femoral vein obstruction. Left: Findings consistent with acute deep vein thrombosis involving the left popliteal vein. No cystic structure found in the popliteal fossa.  *See table(s) above for measurements and observations. Electronically signed by Curt Jews MD on 08/04/2019 at 6:26:14 PM.    Final        Discharge Exam: Vitals:   08/04/19 2306 08/05/19 0622  BP: 139/89 112/79  Pulse: 85 88  Resp: 16 16  Temp: 97.6 F (36.4 C) 98.1 F (36.7 C)  SpO2: 98% 96%   Vitals:   08/04/19 1952 08/04/19 2205 08/04/19 2306 08/05/19 0622  BP: (!) 141/91 131/90 139/89 112/79  Pulse: 85 82 85 88   Resp: (!) 22 (!) 21 16 16   Temp:   97.6 F (36.4 C) 98.1 F (36.7 C)  TempSrc:   Oral Oral  SpO2: 98% 98% 98% 96%  Weight:      Height:        General: Pt is alert, awake, not in acute distress Cardiovascular: RRR, S1/S2 +, no rubs, no gallops Respiratory: CTA bilaterally, no wheezing, no rhonchi Abdominal: Soft, NT, ND, bowel sounds + Extremities: no edema, no cyanosis    The results of significant diagnostics from this hospitalization (including imaging, microbiology, ancillary and laboratory) are listed below for reference.     Microbiology: No results found for this or any previous visit (from the past 240 hour(s)).   Labs: BNP (last 3 results) Recent Labs    10/08/18 0943 10/12/18 1033  BNP 177.2* [id number]*   Basic Metabolic Panel: Recent Labs  Lab 08/04/19 0958 08/04/19 1815 08/05/19 0543  NA 136 136 134*  K 5.2 4.2 4.2  CL 103 106 106  CO2 15* 21* 18*  GLUCOSE 112* 110* 106*  BUN 17 18 18   CREATININE 1.23 1.20 1.07  CALCIUM 10.1 9.7 9.3   Liver Function Tests: Recent Labs  Lab 08/05/19 0543  AST 22  ALT 20  ALKPHOS 59  BILITOT 0.8  PROT 7.0  ALBUMIN 3.8   No results for input(s): LIPASE, AMYLASE in  the last 168 hours. No results for input(s): AMMONIA in the last 168 hours. CBC: Recent Labs  Lab 08/04/19 1815  WBC 6.4  NEUTROABS 3.0  HGB 14.5  HCT 45.3  MCV 88.1  PLT 217   Cardiac Enzymes: No results for input(s): CKTOTAL, CKMB, CKMBINDEX, TROPONINI in the last 168 hours. BNP: Invalid input(s): POCBNP CBG: Recent Labs  Lab 08/04/19 2351 08/05/19 0751 08/05/19 1124  GLUCAP 160* 111* 118*   D-Dimer No results for input(s): DDIMER in the last 72 hours. Hgb A1c No results for input(s): HGBA1C in the last 72 hours. Lipid Profile No results for input(s): CHOL, HDL, LDLCALC, TRIG, CHOLHDL, LDLDIRECT in the last 72 hours. Thyroid function studies No results for input(s): TSH, T4TOTAL, T3FREE, THYROIDAB in the last 72  hours.  Invalid input(s): FREET3 Anemia work up No results for input(s): VITAMINB12, FOLATE, FERRITIN, TIBC, IRON, RETICCTPCT in the last 72 hours. Urinalysis    Component Value Date/Time   COLORURINE YELLOW 10/01/2018 2222   APPEARANCEUR CLEAR 10/01/2018 2222   LABSPEC 1.029 10/01/2018 2222   PHURINE 6.0 10/01/2018 2222   GLUCOSEU NEGATIVE 10/01/2018 2222   HGBUR NEGATIVE 10/01/2018 2222   BILIRUBINUR NEGATIVE 10/01/2018 2222   KETONESUR NEGATIVE 10/01/2018 2222   PROTEINUR NEGATIVE 10/01/2018 2222   NITRITE NEGATIVE 10/01/2018 2222   LEUKOCYTESUR NEGATIVE 10/01/2018 2222   Sepsis Labs Invalid input(s): PROCALCITONIN,  WBC,  LACTICIDVEN Microbiology No results found for this or any previous visit (from the past 240 hour(s)).   Time coordinating discharge: 31 minutes  SIGNED:   Hosie Poisson, MD  [facility] 08/06/2019, 9:11 AM Pager   If 7PM-7AM, please contact night-coverage [email or web address] Password [email or web address]

## 2019-08-06 NOTE — Assessment & Plan Note (Signed)
She had bilateral pulmonary emboli on Eliquis with recent admission for acute DVT involving the left popliteal vein 08/04/2019.  This was deemed a "Eliquis failure".  He was begun on Lovenox injections and Coumadin oral anticoagulation.  He is scheduled to see his hematologist, Dr. Irene Limbo in the upcoming future.  We will check a PT/INR today.

## 2019-08-10 ENCOUNTER — Other Ambulatory Visit: Payer: Self-pay

## 2019-08-10 ENCOUNTER — Ambulatory Visit (INDEPENDENT_AMBULATORY_CARE_PROVIDER_SITE_OTHER): Payer: Self-pay | Admitting: Pharmacist Clinician (PhC)/ Clinical Pharmacy Specialist

## 2019-08-10 DIAGNOSIS — I824Y9 Acute embolism and thrombosis of unspecified deep veins of unspecified proximal lower extremity: Secondary | ICD-10-CM

## 2019-08-10 DIAGNOSIS — Z7901 Long term (current) use of anticoagulants: Secondary | ICD-10-CM

## 2019-08-10 DIAGNOSIS — I2699 Other pulmonary embolism without acute cor pulmonale: Secondary | ICD-10-CM

## 2019-08-10 LAB — POCT INR: INR: 1.5 — AB (ref 2.0–3.0)

## 2019-08-10 NOTE — Patient Instructions (Signed)
Take extra 1/2 tablet today Tuesday Nov 10, then take 1 tablet Wednesday and Thursday and 1/2 tablet Friday morning.  Continue enoxaparin injections twice daily until Friday appointment

## 2019-08-13 ENCOUNTER — Ambulatory Visit (INDEPENDENT_AMBULATORY_CARE_PROVIDER_SITE_OTHER): Payer: Self-pay | Admitting: Pharmacist

## 2019-08-13 ENCOUNTER — Other Ambulatory Visit: Payer: Self-pay

## 2019-08-13 DIAGNOSIS — I2699 Other pulmonary embolism without acute cor pulmonale: Secondary | ICD-10-CM

## 2019-08-13 DIAGNOSIS — Z7901 Long term (current) use of anticoagulants: Secondary | ICD-10-CM

## 2019-08-13 DIAGNOSIS — I824Y9 Acute embolism and thrombosis of unspecified deep veins of unspecified proximal lower extremity: Secondary | ICD-10-CM

## 2019-08-13 LAB — POCT INR: INR: 3.3 — AB (ref 2.0–3.0)

## 2019-08-13 MED ORDER — WARFARIN SODIUM 10 MG PO TABS: ORAL_TABLET | ORAL | 1 refills | Status: DC

## 2019-08-16 ENCOUNTER — Other Ambulatory Visit: Payer: Self-pay

## 2019-08-16 ENCOUNTER — Ambulatory Visit (HOSPITAL_COMMUNITY): Payer: Medicaid Other | Attending: Cardiology

## 2019-08-16 DIAGNOSIS — I420 Dilated cardiomyopathy: Secondary | ICD-10-CM | POA: Diagnosis not present

## 2019-08-16 MED ORDER — PERFLUTREN LIPID MICROSPHERE
1.0000 mL | INTRAVENOUS | Status: AC | PRN
  Administered 2019-08-16: 1 mL via INTRAVENOUS

## 2019-08-20 ENCOUNTER — Other Ambulatory Visit: Payer: Self-pay

## 2019-08-20 ENCOUNTER — Ambulatory Visit (INDEPENDENT_AMBULATORY_CARE_PROVIDER_SITE_OTHER): Payer: Self-pay | Admitting: Pharmacist Clinician (PhC)/ Clinical Pharmacy Specialist

## 2019-08-20 DIAGNOSIS — I824Y9 Acute embolism and thrombosis of unspecified deep veins of unspecified proximal lower extremity: Secondary | ICD-10-CM

## 2019-08-20 DIAGNOSIS — Z7901 Long term (current) use of anticoagulants: Secondary | ICD-10-CM

## 2019-08-20 DIAGNOSIS — I2699 Other pulmonary embolism without acute cor pulmonale: Secondary | ICD-10-CM

## 2019-08-20 LAB — POCT INR: INR: 4.2 — AB (ref 2.0–3.0)

## 2019-08-30 ENCOUNTER — Other Ambulatory Visit: Payer: Self-pay

## 2019-08-30 ENCOUNTER — Ambulatory Visit (INDEPENDENT_AMBULATORY_CARE_PROVIDER_SITE_OTHER): Payer: Self-pay | Admitting: Pharmacist Clinician (PhC)/ Clinical Pharmacy Specialist

## 2019-08-30 DIAGNOSIS — I2699 Other pulmonary embolism without acute cor pulmonale: Secondary | ICD-10-CM

## 2019-08-30 DIAGNOSIS — I824Y9 Acute embolism and thrombosis of unspecified deep veins of unspecified proximal lower extremity: Secondary | ICD-10-CM

## 2019-08-30 DIAGNOSIS — Z7901 Long term (current) use of anticoagulants: Secondary | ICD-10-CM

## 2019-08-30 LAB — POCT INR: INR: 3.1 — AB (ref 2.0–3.0)

## 2019-08-30 MED ORDER — WARFARIN SODIUM 5 MG PO TABS: ORAL_TABLET | ORAL | 3 refills | Status: DC

## 2019-09-04 ENCOUNTER — Other Ambulatory Visit: Payer: Self-pay | Admitting: Cardiovascular Disease

## 2019-09-08 ENCOUNTER — Ambulatory Visit (INDEPENDENT_AMBULATORY_CARE_PROVIDER_SITE_OTHER): Payer: Self-pay | Admitting: Pharmacist Clinician (PhC)/ Clinical Pharmacy Specialist

## 2019-09-08 ENCOUNTER — Other Ambulatory Visit: Payer: Self-pay

## 2019-09-08 DIAGNOSIS — I2699 Other pulmonary embolism without acute cor pulmonale: Secondary | ICD-10-CM

## 2019-09-08 DIAGNOSIS — I824Y9 Acute embolism and thrombosis of unspecified deep veins of unspecified proximal lower extremity: Secondary | ICD-10-CM

## 2019-09-08 DIAGNOSIS — Z7901 Long term (current) use of anticoagulants: Secondary | ICD-10-CM

## 2019-09-08 LAB — POCT INR: INR: 2.7 (ref 2.0–3.0)

## 2019-09-08 NOTE — Patient Instructions (Signed)
Continue with 1 tablet each Monday, Wednesday and Friday, 1/2 tablet all other days.  Repeat INR in 10 days  (when you switch to 5 mg tablets, take 2 on Mondays, Wednesdays and Fridays, 1 all other days)

## 2019-09-13 ENCOUNTER — Other Ambulatory Visit: Payer: Self-pay

## 2019-09-13 ENCOUNTER — Emergency Department (HOSPITAL_COMMUNITY)
Admission: EM | Admit: 2019-09-13 | Discharge: 2019-09-14 | Disposition: A | Discharge status: Home / Self Care | Payer: Medicaid Other | Attending: Emergency Medicine | Admitting: Emergency Medicine

## 2019-09-13 ENCOUNTER — Emergency Department (HOSPITAL_COMMUNITY): Payer: Medicaid Other

## 2019-09-13 ENCOUNTER — Encounter (HOSPITAL_COMMUNITY): Payer: Self-pay | Admitting: Emergency Medicine

## 2019-09-13 DIAGNOSIS — Z5321 Procedure and treatment not carried out due to patient leaving prior to being seen by health care provider: Secondary | ICD-10-CM | POA: Diagnosis not present

## 2019-09-13 DIAGNOSIS — R0789 Other chest pain: Secondary | ICD-10-CM | POA: Insufficient documentation

## 2019-09-13 MED ORDER — SODIUM CHLORIDE 0.9% FLUSH: 3.0000 mL | Freq: Once | INTRAVENOUS | Status: DC

## 2019-09-13 NOTE — ED Triage Notes (Signed)
Patient reports central chest pain onset this evening , denies SOB , no nausea or diaphoresis , his cardiologist is Dr. [name] .

## 2019-09-14 LAB — BASIC METABOLIC PANEL
Anion gap: 11 (ref 5–15)
BUN: 13 mg/dL (ref 8–23)
CO2: 24 mmol/L (ref 22–32)
Calcium: 8.7 mg/dL — ABNORMAL LOW (ref 8.9–10.3)
Chloride: 102 mmol/L (ref 98–111)
Creatinine, Ser: 1.27 mg/dL — ABNORMAL HIGH (ref 0.61–1.24)
GFR calc Af Amer: >60 mL/min (ref >60)
GFR calc non Af Amer: 59 mL/min — ABNORMAL LOW (ref >60)
Glucose, Bld: 101 mg/dL — ABNORMAL HIGH (ref 70–99)
Potassium: 4 mmol/L (ref 3.5–5.1)
Sodium: 137 mmol/L (ref 135–145)

## 2019-09-14 LAB — CBC
HCT: 40.9 % (ref 39.0–52.0)
Hemoglobin: 13.3 g/dL (ref 13.0–17.0)
MCH: 28.2 pg (ref 26.0–34.0)
MCHC: 32.5 g/dL (ref 30.0–36.0)
MCV: 86.7 fL (ref 80.0–100.0)
Platelets: 193 10*3/uL (ref 150–400)
RBC: 4.72 MIL/uL (ref 4.22–5.81)
RDW: 14 % (ref 11.5–15.5)
WBC: 4.6 10*3/uL (ref 4.0–10.5)
nRBC: 0 % (ref 0.0–0.2)

## 2019-09-14 LAB — TROPONIN I (HIGH SENSITIVITY)
Troponin I (High Sensitivity): 16 ng/L (ref <18)
Troponin I (High Sensitivity): 15 ng/L (ref <18)

## 2019-09-14 LAB — PROTIME-INR
INR: 2.8 — ABNORMAL HIGH (ref 0.8–1.2)
Prothrombin Time: 29.6 seconds — ABNORMAL HIGH (ref 11.4–15.2)

## 2019-09-14 NOTE — ED Notes (Signed)
Pt left without being seen.

## 2019-09-17 ENCOUNTER — Emergency Department (HOSPITAL_COMMUNITY)
Admission: EM | Admit: 2019-09-17 | Discharge: 2019-09-17 | Disposition: A | Discharge status: Home / Self Care | Payer: Medicaid Other | Attending: Emergency Medicine | Admitting: Emergency Medicine

## 2019-09-17 ENCOUNTER — Ambulatory Visit: Payer: Medicaid Other | Admitting: Podiatry

## 2019-09-17 ENCOUNTER — Other Ambulatory Visit: Payer: Self-pay

## 2019-09-17 ENCOUNTER — Emergency Department (HOSPITAL_COMMUNITY): Payer: Medicaid Other

## 2019-09-17 DIAGNOSIS — K439 Ventral hernia without obstruction or gangrene: Secondary | ICD-10-CM | POA: Diagnosis not present

## 2019-09-17 DIAGNOSIS — I5042 Chronic combined systolic (congestive) and diastolic (congestive) heart failure: Secondary | ICD-10-CM | POA: Diagnosis not present

## 2019-09-17 DIAGNOSIS — I110 Hypertensive heart disease with heart failure: Secondary | ICD-10-CM | POA: Diagnosis not present

## 2019-09-17 DIAGNOSIS — Z95810 Presence of automatic (implantable) cardiac defibrillator: Secondary | ICD-10-CM | POA: Insufficient documentation

## 2019-09-17 DIAGNOSIS — E119 Type 2 diabetes mellitus without complications: Secondary | ICD-10-CM | POA: Diagnosis not present

## 2019-09-17 DIAGNOSIS — Z7984 Long term (current) use of oral hypoglycemic drugs: Secondary | ICD-10-CM | POA: Insufficient documentation

## 2019-09-17 DIAGNOSIS — R791 Abnormal coagulation profile: Secondary | ICD-10-CM | POA: Insufficient documentation

## 2019-09-17 DIAGNOSIS — R109 Unspecified abdominal pain: Secondary | ICD-10-CM | POA: Diagnosis present

## 2019-09-17 DIAGNOSIS — Z87891 Personal history of nicotine dependence: Secondary | ICD-10-CM | POA: Diagnosis not present

## 2019-09-17 DIAGNOSIS — Z79899 Other long term (current) drug therapy: Secondary | ICD-10-CM | POA: Insufficient documentation

## 2019-09-17 DIAGNOSIS — Z91010 Allergy to peanuts: Secondary | ICD-10-CM | POA: Insufficient documentation

## 2019-09-17 LAB — COMPREHENSIVE METABOLIC PANEL
ALT: 30 U/L (ref 0–44)
AST: 41 U/L (ref 15–41)
Albumin: 3.6 g/dL (ref 3.5–5.0)
Alkaline Phosphatase: 64 U/L (ref 38–126)
Anion gap: 13 (ref 5–15)
BUN: 9 mg/dL (ref 8–23)
CO2: 24 mmol/L (ref 22–32)
Calcium: 9.1 mg/dL (ref 8.9–10.3)
Chloride: 100 mmol/L (ref 98–111)
Creatinine, Ser: 1.05 mg/dL (ref 0.61–1.24)
GFR calc Af Amer: >60 mL/min (ref >60)
GFR calc non Af Amer: >60 mL/min (ref >60)
Glucose, Bld: 118 mg/dL — ABNORMAL HIGH (ref 70–99)
Potassium: 4.3 mmol/L (ref 3.5–5.1)
Sodium: 137 mmol/L (ref 135–145)
Total Bilirubin: 1 mg/dL (ref 0.3–1.2)
Total Protein: 6.9 g/dL (ref 6.5–8.1)

## 2019-09-17 LAB — CBC
HCT: 43.3 % (ref 39.0–52.0)
Hemoglobin: 13.7 g/dL (ref 13.0–17.0)
MCH: 27.8 pg (ref 26.0–34.0)
MCHC: 31.6 g/dL (ref 30.0–36.0)
MCV: 87.8 fL (ref 80.0–100.0)
Platelets: 199 10*3/uL (ref 150–400)
RBC: 4.93 MIL/uL (ref 4.22–5.81)
RDW: 14.2 % (ref 11.5–15.5)
WBC: 5 10*3/uL (ref 4.0–10.5)
nRBC: 0 % (ref 0.0–0.2)

## 2019-09-17 LAB — PROTIME-INR
INR: 1.6 — ABNORMAL HIGH (ref 0.8–1.2)
Prothrombin Time: 18.5 seconds — ABNORMAL HIGH (ref 11.4–15.2)

## 2019-09-17 LAB — LIPASE, BLOOD: Lipase: 29 U/L (ref 11–51)

## 2019-09-17 MED ORDER — IOHEXOL 300 MG/ML  SOLN
100.0000 mL | Freq: Once | INTRAMUSCULAR | Status: AC | PRN
  Administered 2019-09-17: 100 mL via INTRAVENOUS

## 2019-09-17 MED ORDER — FENTANYL CITRATE (PF) 100 MCG/2ML IJ SOLN
100.0000 ug | Freq: Once | INTRAMUSCULAR | Status: AC
  Administered 2019-09-17: 100 ug via INTRAVENOUS
  Filled 2019-09-17: qty 2

## 2019-09-17 MED ORDER — WARFARIN SODIUM 2 MG PO TABS
2.0000 mg | ORAL_TABLET | Freq: Once | ORAL | Status: AC
  Administered 2019-09-17: 2 mg via ORAL
  Filled 2019-09-17: qty 1

## 2019-09-17 MED ORDER — HYDROMORPHONE HCL 1 MG/ML IJ SOLN
1.0000 mg | Freq: Once | INTRAMUSCULAR | Status: AC
  Administered 2019-09-17: 1 mg via INTRAVENOUS
  Filled 2019-09-17: qty 1

## 2019-09-17 MED ORDER — SODIUM CHLORIDE 0.9 % IV BOLUS
1000.0000 mL | Freq: Once | INTRAVENOUS | Status: AC
  Administered 2019-09-17: 1000 mL via INTRAVENOUS

## 2019-09-17 MED ORDER — METHOCARBAMOL 500 MG PO TABS: 500.0000 mg | ORAL_TABLET | Freq: Three times a day (TID) | ORAL | 0 refills | Status: DC | PRN

## 2019-09-17 NOTE — ED Notes (Signed)
Patient Alert and oriented to baseline. Stable and ambulatory to baseline. Patient verbalized understanding of the discharge instructions.  Patient belongings were taken by the patient.   

## 2019-09-17 NOTE — ED Notes (Signed)
EDP at the bedside.  ?

## 2019-09-17 NOTE — Consult Note (Signed)
[facility] Surgery Consult Note  DAH EURESTI [birth year]  BB:[id number].    Requesting MD: Regenia Skeeter  Chief Complaint/Reason for Consult: umbilical hernia   HPI:  Patient is a 63 year old male who presented to [facility] with severe abdominal pain at site of umbilical hernia. Pain started around 0530 today, sudden onset. He reported associated NBNB emesis which he attributed to pain. Has not had any further emesis. Reports pain is improving now. He denies fever, chills, chest pain, SOB, diarrhea, constipation. PMH significant for HTN, NICM, CHF, Hx of DVT/PE on coumadin, and pre-diabetes. No past abdominal surgery. Patient is retired.   ROS: Negative other than listed above  Family History  Problem Relation Age of Onset  . Diabetes Mother   . Diabetes Sister     Past Medical History:  Diagnosis Date  . Abnormal TSH 02/2016  . Anxiety   . Cervical disc disease   . Chronic combined systolic and diastolic CHF (congestive heart failure) ([place])    a. 01/2016 Echo: EF 25%, inf AK, diffuse sev HK, Gr1 DD, mild MR.  . Depression   . Hyperglycemia   . Hypertension   . Hypertensive heart disease   . Myocardial infarction [facility])    ??  maybe  . NICM (nonischemic cardiomyopathy) ([place])    a. 01/2016 Echo: EF 25%, inf AK, diffuse sev HK, Gr1 DD, mild MR; b. 01/2016 MV: EF 22%, no isch/infarct;  c. 02/2016 Cath: Nl cors, EF 35-45%.  . Pre-diabetes   . Seizures ([place])    with penicillin    Past Surgical History:  Procedure Laterality Date  . ANTERIOR CERVICAL DECOMP/DISCECTOMY FUSION N/A 07/10/2016   Procedure: ANTERIOR CERVICAL DECOMPRESSION FUSION CERVICAL 4-5, CERVICAL 5-6, CERVICAL 6-7 WITH INSTRUMENTATION AND ALLOGRAFT;  Surgeon: [name], MD;  Location: [place];  Service: Orthopedics;  Laterality: N/A;  . CARDIAC CATHETERIZATION N/A 03/11/2016   Procedure: Left Heart Cath and Coronary Angiography;  Surgeon: [name], MD;  Location: [place] [facility];  Service: Cardiovascular;   Laterality: N/A;  . ICD IMPLANT N/A 06/17/2018   Procedure: ICD IMPLANT;  Surgeon: [name], MD;  Location: [place] [facility];  Service: Cardiovascular;  Laterality: N/A;  . Thumb surgery Right     Social History:  reports that he quit smoking about 16 years ago. He has a 12.00 pack-year smoking history. He has never used smokeless tobacco. He reports that he does not drink alcohol or use drugs.  Allergies:  Allergies  Allergen Reactions  . Peanut-Containing Drug Products Swelling  . Penicillins Other (See Comments)    CONVULSIONS Has patient had a PCN reaction causing immediate rash, facial/tongue/throat swelling, SOB or lightheadedness with hypotension: No Has patient had a PCN reaction causing severe rash involving mucus membranes or skin necrosis: No Has patient had a PCN reaction that required hospitalization No Has patient had a PCN reaction occurring within the last 10 years: No If all of the above answers are "NO", then may proceed with Cephalosporin use.   . Decadron [Dexamethasone] Itching    (Not in a hospital admission)   Blood pressure 137/89, pulse 83, temperature (!) 97.5 F (36.4 C), temperature source Oral, resp. rate (!) 23, SpO2 95 %. Physical Exam: Physical Exam Constitutional:      General: He is not in acute distress.    Appearance: He is not ill-appearing.  HENT:     Head: Normocephalic and atraumatic.     Right Ear: External ear normal.  Left Ear: External ear normal.     Nose: Nose normal.  Eyes:     General: No scleral icterus.    Extraocular Movements: Extraocular movements intact.     Conjunctiva/sclera: Conjunctivae normal.  Cardiovascular:     Rate and Rhythm: Normal rate and regular rhythm.     Pulses: Normal pulses.     Heart sounds: Normal heart sounds.  Pulmonary:     Effort: Pulmonary effort is normal.     Breath sounds: Normal breath sounds.  Abdominal:     General: Bowel sounds are normal. There is no distension.      Palpations: Abdomen is soft.     Tenderness: There is abdominal tenderness. There is no guarding or rebound.     Hernia: A hernia (umbilical, partially reducible, no skin changes overlying ) is present.  Musculoskeletal:     Cervical back: Normal range of motion and neck supple.     Comments: No obvious deformities of all 4 extremities  Skin:    General: Skin is warm and dry.  Neurological:     General: No focal deficit present.     Mental Status: He is alert and oriented to person, place, and time.     Results for orders placed or performed during the hospital encounter of 09/17/19 (from the past 48 hour(s))  Comprehensive metabolic panel     Status: Abnormal   Collection Time: 09/17/19  6:45 AM  Result Value Ref Range   Sodium 137 135 - 145 mmol/L   Potassium 4.3 3.5 - 5.1 mmol/L    Comment: SPECIMEN HEMOLYZED. HEMOLYSIS MAY AFFECT INTEGRITY OF RESULTS.   Chloride 100 98 - 111 mmol/L   CO2 24 22 - 32 mmol/L   Glucose, Bld 118 (H) 70 - 99 mg/dL   BUN 9 8 - 23 mg/dL   Creatinine, Ser 1.05 0.61 - 1.24 mg/dL   Calcium 9.1 8.9 - 10.3 mg/dL   Total Protein 6.9 6.5 - 8.1 g/dL   Albumin 3.6 3.5 - 5.0 g/dL   AST 41 15 - 41 U/L   ALT 30 0 - 44 U/L   Alkaline Phosphatase 64 38 - 126 U/L   Total Bilirubin 1.0 0.3 - 1.2 mg/dL   GFR calc non Af Amer >60 >60 mL/min   GFR calc Af Amer >60 >60 mL/min   Anion gap 13 5 - 15    Comment: Performed at [facility], [place] [address], [place] [address]  CBC     Status: None   Collection Time: 09/17/19  6:45 AM  Result Value Ref Range   WBC 5.0 4.0 - 10.5 K/uL   RBC 4.93 4.22 - 5.81 MIL/uL   Hemoglobin 13.7 13.0 - 17.0 g/dL   HCT 43.3 39.0 - 52.0 %   MCV 87.8 80.0 - 100.0 fL   MCH 27.8 26.0 - 34.0 pg   MCHC 31.6 30.0 - 36.0 g/dL   RDW 14.2 11.5 - 15.5 %   Platelets 199 150 - 400 K/uL   nRBC 0.0 0.0 - 0.2 %    Comment: Performed at [facility], [place] [address] [place], [place] [address]  Protime-INR     Status:  Abnormal   Collection Time: 09/17/19  7:30 AM  Result Value Ref Range   Prothrombin Time 18.5 (H) 11.4 - 15.2 seconds   INR 1.6 (H) 0.8 - 1.2    Comment: (NOTE) INR goal varies based on device and disease states. Performed at  [facility], [place] [address] [place], [place] [address]   Lipase, blood     Status: None   Collection Time: 09/17/19  7:30 AM  Result Value Ref Range   Lipase 29 11 - 51 U/L    Comment: Performed at [facility] [address], [place], [place] [address]   CT ABDOMEN PELVIS W CONTRAST  Result Date: 09/17/2019 CLINICAL DATA:  Nausea, vomiting, hernia. EXAM: CT ABDOMEN AND PELVIS WITH CONTRAST TECHNIQUE: Multidetector CT imaging of the abdomen and pelvis was performed using the standard protocol following bolus administration of intravenous contrast. CONTRAST:  186mL OMNIPAQUE IOHEXOL 300 MG/ML  SOLN COMPARISON:  October 02, 2018. FINDINGS: Lower chest: Small sliding-type hiatal hernia. Visualized lung bases are unremarkable. Hepatobiliary: No focal liver abnormality is seen. No gallstones, gallbladder wall thickening, or biliary dilatation. Pancreas: Unremarkable. No pancreatic ductal dilatation or surrounding inflammatory changes. Spleen: Normal in size without focal abnormality. Adrenals/Urinary Tract: Adrenal glands are unremarkable. Kidneys are normal, without renal calculi, focal lesion, or hydronephrosis. Bladder is unremarkable. Stomach/Bowel: The stomach appears normal. There is no evidence of bowel obstruction or inflammation. The appendix is not visualized. Sigmoid diverticulosis is noted without inflammation. Vascular/Lymphatic: Aortic atherosclerosis. No enlarged abdominal or pelvic lymph nodes. Reproductive: Prostate is unremarkable. Other: Small bilateral fat containing inguinal hernias are noted. Moderate size fat containing epigastric ventral hernia is noted which is significantly enlarged compared to prior exam with some inflammatory stranding  present. Musculoskeletal: No acute or significant osseous findings. IMPRESSION: 1. Small sliding-type hiatal hernia. 2. Small bilateral fat containing inguinal hernias. 3. Moderate size fat containing epigastric ventral hernia is noted which is significantly enlarged compared to prior exam with some inflammatory stranding present. 4. Sigmoid diverticulosis without inflammation. 5. Aortic atherosclerosis. Aortic Atherosclerosis (ICD10-I70.0). Electronically Signed   By: Marijo Conception M.D.   On: 09/17/2019 09:21      Assessment/Plan HTN Non-ischemic cardiomyopathy Combined systolic and diastolic CHF Pre-diabetes Hx of PE/DVT - on coumadin  Fat containing umbilical hernia - patient had n/v but only with pain  - pain is improved now - no skin changes or concern for infection - recommend discharge home from ED with outpatient follow up  - patient advised he will need cardiac clearance prior to any surgical intervention  Brigid Re, [facility] Surgery 09/17/2019, 10:26 AM Please see Amion for pager number during day hours 7:00am-4:30pm

## 2019-09-17 NOTE — ED Triage Notes (Signed)
Pt c/o abd pain (above umbilicus), firm. Pt c/o 10/10 pain.

## 2019-09-17 NOTE — Discharge Instructions (Addendum)
Hernia, Adult     A hernia happens when tissue inside your body pushes out through a weak spot in your belly muscles (abdominal wall). This makes a round lump (bulge). The lump may be: In a scar from surgery that was done in your belly (incisional hernia). Near your belly button (umbilical hernia). In your groin (inguinal hernia). Your groin is the area where your leg meets your lower belly (abdomen). This kind of hernia could also be: In your scrotum, if you are male. In folds of skin around your vagina, if you are male. In your upper thigh (femoral hernia). Inside your belly (hiatal hernia). This happens when your stomach slides above the muscle between your belly and your chest (diaphragm). If your hernia is small and it does not cause pain, you may not need treatment. If your hernia is large or it causes pain, you may need surgery. Follow these instructions at home: Activity Avoid stretching or overusing (straining) the muscles near your hernia. Straining can happen when you: Lift something heavy. Poop (have a bowel movement). Do not lift anything that is heavier than 10 lb (4.5 kg), or the limit that you are told, until your doctor says that it is safe. Use the strength of your legs when you lift something heavy. Do not use only your back muscles to lift. General instructions Do these things if told by your doctor so you do not have trouble pooping (constipation): Drink enough fluid to keep your pee (urine) pale yellow. Eat foods that are high in fiber. These include fresh fruits and vegetables, whole grains, and beans. Limit foods that are high in fat and processed sugars. These include foods that are fried or sweet. Take medicine for trouble pooping. When you cough, try to cough gently. You may try to push your hernia in by very gently pressing on it when you are lying down. Do not try to force the bulge back in if it will not push in easily. If you are overweight, work with your  doctor to lose weight safely. Do not use any products that have nicotine or tobacco in them. These include cigarettes and e-cigarettes. If you need help quitting, ask your doctor. If you will be having surgery (hernia repair), watch your hernia for changes in shape, size, or color. Tell your doctor if you see any changes. Take over-the-counter and prescription medicines only as told by your doctor. Keep all follow-up visits as told by your doctor. Contact a doctor if: You get new pain, swelling, or redness near your hernia. You poop fewer times in a week than normal. You have trouble pooping. You have poop (stool) that is more dry than normal. You have poop that is harder or larger than normal. Get help right away if: You have a fever. You have belly pain that gets worse. You feel sick to your stomach (nauseous). You throw up (vomit). Your hernia cannot be pushed in by very gently pressing on it when you are lying down. Do not try to force the bulge back in if it will not push in easily. Your hernia: Changes in shape or size. Changes color. Feels hard or it hurts when you touch it. These symptoms may represent a serious problem that is an emergency. Do not wait to see if the symptoms will go away. Get medical help right away. Call your local emergency services (911 in the [place] Summary A hernia happens when tissue inside your body pushes out through a weak spot  in the belly muscles. This creates a bulge. If your hernia is small and it does not hurt, you may not need treatment. If your hernia is large or it hurts, you may need surgery. If you will be having surgery, watch your hernia for changes in shape, size, or color. Tell your doctor about any changes. This information is not intended to replace advice given to you by your health care provider. Make sure you discuss any questions you have with your health care provider. Document Released: 03/06/2010 Document Revised: 01/07/2019  Document Reviewed: 06/18/2017 Elsevier Patient Education  2020 [employer].

## 2019-09-17 NOTE — ED Notes (Signed)
Patient transported to CT 

## 2019-09-17 NOTE — ED Notes (Signed)
Pt returns from ct scan. Remains on tele.

## 2019-09-17 NOTE — ED Provider Notes (Signed)
[facility] EMERGENCY DEPARTMENT Provider Note   CSN: [id number] Arrival date & time: 09/17/19  [id number]     History Chief Complaint  Patient presents with  . Abdominal Pain    Jonathan Dickson is a 63 y.o. male.  HPI  63 year old male presents with abdominal pain.  Started a couple hours ago all of a sudden.  It is right where his abdominal wall hernia is.  Patient states the pain is severe, 10/10.  He vomited twice without blood.  No back pain, chest pain, shortness of breath or diarrhea.  Past Medical History:  Diagnosis Date  . Abnormal TSH 02/2016  . Anxiety   . Cervical disc disease   . Chronic combined systolic and diastolic CHF (congestive heart failure) ([place])    a. 01/2016 Echo: EF 25%, inf AK, diffuse sev HK, Gr1 DD, mild MR.  . Depression   . Hyperglycemia   . Hypertension   . Hypertensive heart disease   . Myocardial infarction [facility])    ??  maybe  . NICM (nonischemic cardiomyopathy) ([place])    a. 01/2016 Echo: EF 25%, inf AK, diffuse sev HK, Gr1 DD, mild MR; b. 01/2016 MV: EF 22%, no isch/infarct;  c. 02/2016 Cath: Nl cors, EF 35-45%.  . Pre-diabetes   . Seizures ([place])    with penicillin    Patient Active Problem List   Diagnosis Date Noted  . Long term (current) use of anticoagulants 08/06/2019  . DVT (deep venous thrombosis) ([place]) 08/04/2019  . Hypertensive retinopathy 06/30/2019  . Pain due to onychomycosis of toenails of both feet 05/28/2019  . Hyperlipidemia associated with type 2 diabetes mellitus ([place]) 05/15/2019  . Type 2 diabetes mellitus without complication, without long-term current use of insulin ([place]) 05/13/2019  . 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 Chronic systolic heart failure ([place]) 06/17/2018  . Bilateral pulmonary embolism ([place]) 05/12/2018  . Heart failure ([place]) 05/11/2018  . Claudication in peripheral vascular disease ([place]) 12/31/2017  . Decreased hearing of both ears 11/10/2017  . Cervical disc disease with myelopathy  07/10/2016  . NICM (nonischemic cardiomyopathy) ([place]) 03/12/2016  . Congestive dilated cardiomyopathy ([place]) 03/11/2016  . Cervical radiculitis 04/19/2015  . Essential hypertension 09/27/2014    Past Surgical History:  Procedure Laterality Date  . ANTERIOR CERVICAL DECOMP/DISCECTOMY FUSION N/A 07/10/2016   Procedure: ANTERIOR CERVICAL DECOMPRESSION FUSION CERVICAL 4-5, CERVICAL 5-6, CERVICAL 6-7 WITH INSTRUMENTATION AND ALLOGRAFT;  Surgeon: [name], MD;  Location: [facility];  Service: Orthopedics;  Laterality: N/A;  . CARDIAC CATHETERIZATION N/A 03/11/2016   Procedure: Left Heart Cath and Coronary Angiography;  Surgeon: [name], MD;  Location: [facility];  Service: Cardiovascular;  Laterality: N/A;  . ICD IMPLANT N/A 06/17/2018   Procedure: ICD IMPLANT;  Surgeon: [name], MD;  Location: [facility];  Service: Cardiovascular;  Laterality: N/A;  . Thumb surgery Right        Family History  Problem Relation Age of Onset  . Diabetes Mother   . Diabetes Sister     Social History   Tobacco Use  . Smoking status: Former Smoker    Packs/day: 1.50    Years: 8.00    Pack years: 12.00    Quit date: 03/15/2003    Years since quitting: 16.5  . Smokeless tobacco: Never Used  Substance Use Topics  . Alcohol use: No    Comment: none since 2004  . Drug use: No    Comment: former  none  since 2004    Home Medications Prior to Admission medications   Medication Sig Start Date End Date Taking? Authorizing Provider  amitriptyline (ELAVIL) 25 MG tablet TAKE 1 TABLET(25 MG) BY MOUTH AT BEDTIME AS NEEDED FOR SLEEP Patient taking differently: Take 25 mg by mouth at bedtime as needed for sleep.  06/23/19  Yes Ladell Pier, MD  atorvastatin (LIPITOR) 20 MG tablet Take 1 tablet (20 mg total) by mouth daily. 05/15/19  Yes Ladell Pier, MD  carvedilol (COREG) 25 MG tablet Take 1 tablet (25 mg total) by mouth 2 (two) times daily with a meal. 12/29/18  Yes  Camnitz, Will Hassell Done, MD  Chlorphen-Phenyleph-ASA (ALKA-SELTZER PLUS COLD PO) Take 1-2 tablets by mouth 2 (two) times daily as needed (for present symptoms).   Yes [provider]  hydrALAZINE (APRESOLINE) 50 MG tablet TAKE 1 TABLET(50 MG) BY MOUTH THREE TIMES DAILY. FOLLOW UP APPOINTMENT PLEASE Patient taking differently: Take 50 mg by mouth 3 (three) times daily.  09/06/19  Yes Lorretta Harp, MD  metFORMIN (GLUCOPHAGE) 500 MG tablet Take 1 tablet (500 mg total) by mouth daily with breakfast. 05/13/19  Yes Ladell Pier, MD  sacubitril-valsartan (ENTRESTO) 97-103 MG Take 1 tablet by mouth 2 (two) times daily. 03/25/19  Yes McClung, Angela M, PA-C  sildenafil (VIAGRA) 50 MG tablet TAKE 1 TABLET BY MOUTH EVERY DAIY AS NEEDED FOR ERECTILE DYSFUNCTION Patient taking differently: Take 50 mg by mouth daily as needed for erectile dysfunction.  03/25/19  Yes Argentina Donovan, PA-C  spironolactone (ALDACTONE) 25 MG tablet Take 0.5 tablets (12.5 mg total) by mouth daily. 06/03/19  Yes Bensimhon, Shaune Pascal, MD  Tafamidis Meglumine, Cardiac, (VYNDAQEL) 20 MG CAPS Take 80 mg by mouth daily. 05/21/19  Yes Bensimhon, Shaune Pascal, MD  warfarin (COUMADIN) 5 MG tablet Take 1 to 2 tablets by mouth daily as directed 08/30/19  Yes Lorretta Harp, MD  enoxaparin (LOVENOX) 80 MG/0.8ML injection Inject 0.8 mLs (80 mg total) into the skin every 12 (twelve) hours for 10 days. Patient not taking: Reported on 09/17/2019 08/05/19 08/15/19  Hosie Poisson, MD  furosemide (LASIX) 40 MG tablet Take 1 tablet (40 mg total) by mouth daily as needed for fluid (if weight gain more than 3 punds). Patient not taking: Reported on 09/17/2019 05/14/18 08/06/19  Jonetta Osgood, MD  methocarbamol (ROBAXIN) 500 MG tablet Take 1 tablet (500 mg total) by mouth every 8 (eight) hours as needed for muscle spasms. 09/17/19   Sherwood Gambler, MD    Allergies    Peanut-containing drug products, Penicillins, and Decadron  [dexamethasone]  Review of Systems   Review of Systems  Constitutional: Negative for fever.  Respiratory: Negative for shortness of breath.   Cardiovascular: Negative for chest pain.  Gastrointestinal: Positive for abdominal pain and vomiting. Negative for nausea.  Musculoskeletal: Negative for back pain.  All other systems reviewed and are negative.   Physical Exam Updated Vital Signs BP 137/89   Pulse 79   Temp (!) 97.5 F (36.4 C) (Oral)   Resp (!) 25   SpO2 98%   Physical Exam Vitals and nursing note reviewed.  Constitutional:      General: He is not in acute distress.    Appearance: He is well-developed. He is not ill-appearing or diaphoretic.  HENT:     Head: Normocephalic and atraumatic.     Right Ear: External ear normal.     Left Ear: External ear normal.     Nose: Nose  normal.  Eyes:     General:        Right eye: No discharge.        Left eye: No discharge.  Cardiovascular:     Rate and Rhythm: Normal rate and regular rhythm.     Heart sounds: Normal heart sounds.  Pulmonary:     Effort: Pulmonary effort is normal.     Breath sounds: Normal breath sounds.  Abdominal:     Palpations: Abdomen is soft.     Tenderness: There is abdominal tenderness.    Musculoskeletal:     Cervical back: Neck supple.  Skin:    General: Skin is warm and dry.  Neurological:     Mental Status: He is alert.  Psychiatric:        Mood and Affect: Mood is not anxious.     ED Results / Procedures / Treatments   Labs (all labs ordered are listed, but only abnormal results are displayed) Labs Reviewed  COMPREHENSIVE METABOLIC PANEL - Abnormal; Notable for the following components:      Result Value   Glucose, Bld 118 (*)    All other components within normal limits  PROTIME-INR - Abnormal; Notable for the following components:   Prothrombin Time 18.5 (*)    INR 1.6 (*)    All other components within normal limits  CBC  LIPASE, BLOOD    EKG EKG  Interpretation  Date/Time:  Friday September 17 2019 07:30:10 EST Ventricular Rate:  81 PR Interval:    QRS Duration: 104 QT Interval:  393 QTC Calculation: 457 R Axis:   35 Text Interpretation: Sinus rhythm no acute ST/T changes similar to Sep 13 2019 Confirmed by Sherwood Gambler [phone]) on 09/17/2019 7:38:53 AM   Radiology CT ABDOMEN PELVIS W CONTRAST  Result Date: 09/17/2019 CLINICAL DATA:  Nausea, vomiting, hernia. EXAM: CT ABDOMEN AND PELVIS WITH CONTRAST TECHNIQUE: Multidetector CT imaging of the abdomen and pelvis was performed using the standard protocol following bolus administration of intravenous contrast. CONTRAST:  142mL OMNIPAQUE IOHEXOL 300 MG/ML  SOLN COMPARISON:  October 02, 2018. FINDINGS: Lower chest: Small sliding-type hiatal hernia. Visualized lung bases are unremarkable. Hepatobiliary: No focal liver abnormality is seen. No gallstones, gallbladder wall thickening, or biliary dilatation. Pancreas: Unremarkable. No pancreatic ductal dilatation or surrounding inflammatory changes. Spleen: Normal in size without focal abnormality. Adrenals/Urinary Tract: Adrenal glands are unremarkable. Kidneys are normal, without renal calculi, focal lesion, or hydronephrosis. Bladder is unremarkable. Stomach/Bowel: The stomach appears normal. There is no evidence of bowel obstruction or inflammation. The appendix is not visualized. Sigmoid diverticulosis is noted without inflammation. Vascular/Lymphatic: Aortic atherosclerosis. No enlarged abdominal or pelvic lymph nodes. Reproductive: Prostate is unremarkable. Other: Small bilateral fat containing inguinal hernias are noted. Moderate size fat containing epigastric ventral hernia is noted which is significantly enlarged compared to prior exam with some inflammatory stranding present. Musculoskeletal: No acute or significant osseous findings. IMPRESSION: 1. Small sliding-type hiatal hernia. 2. Small bilateral fat containing inguinal hernias. 3.  Moderate size fat containing epigastric ventral hernia is noted which is significantly enlarged compared to prior exam with some inflammatory stranding present. 4. Sigmoid diverticulosis without inflammation. 5. Aortic atherosclerosis. Aortic Atherosclerosis (ICD10-I70.0). Electronically Signed   By: Marijo Conception M.D.   On: 09/17/2019 09:21    Procedures Procedures (including critical care time)  Medications Ordered in ED Medications  sodium chloride 0.9 % bolus 1,000 mL (0 mLs Intravenous Stopped 09/17/19 1148)  HYDROmorphone (DILAUDID) injection 1 mg (1 mg Intravenous Given  09/17/19 0735)  iohexol (OMNIPAQUE) 300 MG/ML solution 100 mL (100 mLs Intravenous Contrast Given 09/17/19 0905)  fentaNYL (SUBLIMAZE) injection 100 mcg (100 mcg Intravenous Given 09/17/19 1031)  warfarin (COUMADIN) tablet 2 mg (2 mg Oral Given 09/17/19 1154)    ED Course  I have reviewed the triage vital signs and the nursing notes.  Pertinent labs & imaging results that were available during my care of the patient were reviewed by me and considered in my medical decision making (see chart for details).    MDM Rules/Calculators/A&P                      I was unable to fully reduce the patient's abdominal wall hernia.  CT thus obtained and shows ventral hernia though only fat is present in it.  I discussed with general surgery who saw patient and feels he can go home and possibly have outpatient surgery.  He is feeling better and no further vomiting.  No obstruction.  I think is reasonable to discharge home given no bowel entrapment or incarceration/strangulation.  We discussed return precautions but he appears stable for DC home.  He is noted to be subtherapeutic on his INR and pharmacy has recommended an extra 2 mg warfarin now (he takes in the morning) and follow-up with his Coumadin provider. Final Clinical Impression(s) / ED Diagnoses Final diagnoses:  Abdominal wall hernia  Subtherapeutic international  normalized ratio (INR)    Rx / DC Orders ED Discharge Orders         Ordered    methocarbamol (ROBAXIN) 500 MG tablet  Every 8 hours PRN     09/17/19 1119           Sherwood Gambler, MD 09/17/19 1302

## 2019-09-20 ENCOUNTER — Other Ambulatory Visit: Payer: Self-pay

## 2019-09-20 ENCOUNTER — Ambulatory Visit (INDEPENDENT_AMBULATORY_CARE_PROVIDER_SITE_OTHER): Payer: Self-pay | Admitting: Pharmacist Clinician (PhC)/ Clinical Pharmacy Specialist

## 2019-09-20 ENCOUNTER — Telehealth: Payer: Self-pay

## 2019-09-20 DIAGNOSIS — I2699 Other pulmonary embolism without acute cor pulmonale: Secondary | ICD-10-CM

## 2019-09-20 DIAGNOSIS — Z7901 Long term (current) use of anticoagulants: Secondary | ICD-10-CM

## 2019-09-20 DIAGNOSIS — I824Y9 Acute embolism and thrombosis of unspecified deep veins of unspecified proximal lower extremity: Secondary | ICD-10-CM

## 2019-09-20 LAB — POCT INR: INR: 1.7 — AB (ref 2.0–3.0)

## 2019-09-20 NOTE — Telephone Encounter (Signed)
   [facility] Pre-operative Risk Assessment    Request for surgical clearance:  1. What type of surgery is being performed? EPIGASTRIC    2. When is this surgery scheduled? TBD   3. What type of clearance is required (medical clearance vs. Pharmacy clearance to hold med vs. Both)? BOTH  4. Are there any medications that need to be held prior to surgery and how long? WARFARIN   5. Practice name and name of physician performing surgery? [facility] ATTN:ARMEN   6. What is your office phone number [phone]     7.   What is your office fax number  [phone]  8.   Anesthesia type (None, local, MAC, general) ? GENERAL

## 2019-09-20 NOTE — Patient Instructions (Signed)
Take an extra tablet today Monday Dec 21, then change dose to 1.5 tablets daily.  Repeat INR in 1 week

## 2019-09-21 NOTE — Telephone Encounter (Signed)
Dr. [name], this pt has hx of TTR cardiac amyloidosis with EF ~30%, has BiV ICD/CRT on optimized HF meds. No CAD. He is on warfarin for hx of PE and then DVT in 08/2019 while on Eliquis. You cleared him for laminectomy on 08/06/19. He now needs clearance for Epigastric surgery. (looks like hernia repair)  Sine this is more invasive surgery, can you comment on clearance.   Please route response back to P CV DIV PREOP  Thanks

## 2019-09-21 NOTE — Telephone Encounter (Signed)
   Primary Cardiologist: Quay Burow, MD  Chart reviewed as part of pre-operative protocol coverage. The patient was last seen by Dr. [name] on 08/06/2019. SHERMON EDWARDSEN has a hx of TTR cardiac amyloidosis with EF ~30%, has BiV ICD/CRT on optimized HF meds. No CAD.    Based on ACC/AHA guidelines, BRAHIM OGANDO is a a slightly elevated risk for surgery, but acceptable. He will need close hemodynamic monitoring.   Pt previously took Eliquis for hx of large bilateral PE. He was admitted with LLE DVT while on Eliquis in Nov 2020, considered therapeutic failure and changed to warfarin.  He will require Lovenox bridging in order to hold his warfarin for 5 days prior to procedure given recent DVT dx while on anticoagulation. Bridge will be coordinated at [facility] where pt is seen.  I will route this recommendation to the requesting party via Epic fax function and remove from pre-op pool.  Please call with questions.  Daune Perch, NP 09/21/2019, 2:34 PM

## 2019-09-21 NOTE — Telephone Encounter (Signed)
Still a low risk procedure Gae Bon. Cleared

## 2019-09-21 NOTE — Telephone Encounter (Signed)
This patient has a hx of PE and then developed DVT in 08/2019 while on Eliquis so was switched to warfarin. I will send to our pharmacist for recommendations.

## 2019-09-21 NOTE — Telephone Encounter (Addendum)
Pt previously took Eliquis for hx of large bilateral PE. He was admitted with LLE DVT while on Eliquis in Nov 2020, considered therapeutic failure and changed to warfarin.  He will require Lovenox bridging in order to hold his warfarin for 5 days prior to procedure given recent DVT dx while on anticoagulation. Bridge will be coordinated at [facility] where pt is seen.

## 2019-09-23 ENCOUNTER — Encounter (HOSPITAL_COMMUNITY): Payer: Self-pay

## 2019-09-23 ENCOUNTER — Other Ambulatory Visit: Payer: Self-pay

## 2019-09-23 ENCOUNTER — Emergency Department (HOSPITAL_COMMUNITY): Payer: Medicaid Other

## 2019-09-23 ENCOUNTER — Inpatient Hospital Stay (HOSPITAL_COMMUNITY)
Admission: EM | Admit: 2019-09-23 | Discharge: 2019-09-25 | DRG: 177 | Discharge status: Against Medical Advice | Payer: Medicaid Other | Attending: Internal Medicine | Admitting: Internal Medicine

## 2019-09-23 DIAGNOSIS — J9601 Acute respiratory failure with hypoxia: Secondary | ICD-10-CM | POA: Diagnosis present

## 2019-09-23 DIAGNOSIS — Z7901 Long term (current) use of anticoagulants: Secondary | ICD-10-CM

## 2019-09-23 DIAGNOSIS — I110 Hypertensive heart disease with heart failure: Secondary | ICD-10-CM | POA: Diagnosis present

## 2019-09-23 DIAGNOSIS — Z91010 Allergy to peanuts: Secondary | ICD-10-CM

## 2019-09-23 DIAGNOSIS — Z833 Family history of diabetes mellitus: Secondary | ICD-10-CM

## 2019-09-23 DIAGNOSIS — U071 COVID-19: Principal | ICD-10-CM | POA: Diagnosis present

## 2019-09-23 DIAGNOSIS — Z981 Arthrodesis status: Secondary | ICD-10-CM

## 2019-09-23 DIAGNOSIS — J1289 Other viral pneumonia: Secondary | ICD-10-CM | POA: Diagnosis present

## 2019-09-23 DIAGNOSIS — R0602 Shortness of breath: Secondary | ICD-10-CM

## 2019-09-23 DIAGNOSIS — Z86718 Personal history of other venous thrombosis and embolism: Secondary | ICD-10-CM

## 2019-09-23 DIAGNOSIS — E119 Type 2 diabetes mellitus without complications: Secondary | ICD-10-CM

## 2019-09-23 DIAGNOSIS — Z5329 Procedure and treatment not carried out because of patient's decision for other reasons: Secondary | ICD-10-CM | POA: Diagnosis present

## 2019-09-23 DIAGNOSIS — I10 Essential (primary) hypertension: Secondary | ICD-10-CM | POA: Diagnosis present

## 2019-09-23 DIAGNOSIS — Z86711 Personal history of pulmonary embolism: Secondary | ICD-10-CM

## 2019-09-23 DIAGNOSIS — R079 Chest pain, unspecified: Secondary | ICD-10-CM

## 2019-09-23 DIAGNOSIS — R0902 Hypoxemia: Secondary | ICD-10-CM

## 2019-09-23 DIAGNOSIS — Z20822 Contact with and (suspected) exposure to covid-19: Secondary | ICD-10-CM

## 2019-09-23 DIAGNOSIS — I5022 Chronic systolic (congestive) heart failure: Secondary | ICD-10-CM | POA: Diagnosis present

## 2019-09-23 DIAGNOSIS — Z95810 Presence of automatic (implantable) cardiac defibrillator: Secondary | ICD-10-CM

## 2019-09-23 DIAGNOSIS — I82409 Acute embolism and thrombosis of unspecified deep veins of unspecified lower extremity: Secondary | ICD-10-CM | POA: Diagnosis present

## 2019-09-23 DIAGNOSIS — Z880 Allergy status to penicillin: Secondary | ICD-10-CM

## 2019-09-23 DIAGNOSIS — E854 Organ-limited amyloidosis: Secondary | ICD-10-CM | POA: Diagnosis present

## 2019-09-23 DIAGNOSIS — I43 Cardiomyopathy in diseases classified elsewhere: Secondary | ICD-10-CM | POA: Diagnosis present

## 2019-09-23 LAB — BASIC METABOLIC PANEL
Anion gap: 11 (ref 5–15)
BUN: 26 mg/dL — ABNORMAL HIGH (ref 8–23)
CO2: 19 mmol/L — ABNORMAL LOW (ref 22–32)
Calcium: 8.9 mg/dL (ref 8.9–10.3)
Chloride: 104 mmol/L (ref 98–111)
Creatinine, Ser: 1.31 mg/dL — ABNORMAL HIGH (ref 0.61–1.24)
GFR calc Af Amer: >60 mL/min (ref >60)
GFR calc non Af Amer: 57 mL/min — ABNORMAL LOW (ref >60)
Glucose, Bld: 120 mg/dL — ABNORMAL HIGH (ref 70–99)
Potassium: 4.5 mmol/L (ref 3.5–5.1)
Sodium: 134 mmol/L — ABNORMAL LOW (ref 135–145)

## 2019-09-23 LAB — CBC
HCT: 42.6 % (ref 39.0–52.0)
Hemoglobin: 13.5 g/dL (ref 13.0–17.0)
MCH: 27.4 pg (ref 26.0–34.0)
MCHC: 31.7 g/dL (ref 30.0–36.0)
MCV: 86.6 fL (ref 80.0–100.0)
Platelets: 250 10*3/uL (ref 150–400)
RBC: 4.92 MIL/uL (ref 4.22–5.81)
RDW: 14.1 % (ref 11.5–15.5)
WBC: 6.1 10*3/uL (ref 4.0–10.5)
nRBC: 0 % (ref 0.0–0.2)

## 2019-09-23 LAB — TROPONIN I (HIGH SENSITIVITY): Troponin I (High Sensitivity): 13 ng/L (ref <18)

## 2019-09-23 LAB — BRAIN NATRIURETIC PEPTIDE: B Natriuretic Peptide: 40 pg/mL (ref 0.0–100.0)

## 2019-09-23 MED ORDER — SODIUM CHLORIDE 0.9% FLUSH: 3.0000 mL | Freq: Once | INTRAVENOUS | Status: DC

## 2019-09-23 NOTE — ED Triage Notes (Signed)
Pt bib [facility] from home with c/o non radiating, constant, chest pain x 24 hrs 7/10 pain. Pt endorses SOB. Pt has hx of MI and currently has a defibrillator. EMS 12 lead unremarkable, EMS VSS.

## 2019-09-23 NOTE — ED Provider Notes (Signed)
[facility] EMERGENCY DEPARTMENT Provider Note   CSN: [id number] Arrival date & time: 09/23/19  2216     History Chief Complaint  Patient presents with  . Chest Pain    Jonathan Dickson is a 63 y.o. male with a hx of NICM, PE/DVT currently on Coumadin, hyperlipidemia, non-insulin-dependent diabetes, cardiac amyloidosis presents to the Emergency Department complaining of gradual, persistent, progressively worsening Covid-like symptoms onset 6 days ago.  Pt reports constant central chest pain onset 4 days ago. Associated symptoms include cough, fever, shortness of breath, nausea and vomiting.  Pt reports he has an ICD but it has not fired.  Patient reports his shortness of breath reminds him of a congestive heart failure exacerbation however he reports that when this happens he normally has peripheral edema and he has not developed any. Exertion makes his chest pain or shortness of breath worse.  Nothing seems to make it better.  He reports shortness of breath even at rest.  No known sick contacts.  Records reviewed.  Patient diagnosed with bilateral pulmonary embolism in August 2019 and was placed on Eliquis.  Despite this he developed DVT in the left lower extremity and was switched to Coumadin.  Patient left the hospital AMA on 08/05/2019 before he was therapeutic.    INR 1.7 on 09/20/2019 is subtherapeutic.  INR was low on 09/17/2019 but was therapeutic on 09/13/2019 at 2.8.  The history is provided by the patient and medical records. No language interpreter was used.       Past Medical History:  Diagnosis Date  . Abnormal TSH 02/2016  . Anxiety   . Cervical disc disease   . Chronic combined systolic and diastolic CHF (congestive heart failure) ([place])    a. 01/2016 Echo: EF 25%, inf AK, diffuse sev HK, Gr1 DD, mild MR.  . Depression   . Hyperglycemia   . Hypertension   . Hypertensive heart disease   . Myocardial infarction [facility])    ??  maybe  . NICM (nonischemic  cardiomyopathy) ([place])    a. 01/2016 Echo: EF 25%, inf AK, diffuse sev HK, Gr1 DD, mild MR; b. 01/2016 MV: EF 22%, no isch/infarct;  c. 02/2016 Cath: Nl cors, EF 35-45%.  . Pre-diabetes   . Seizures ([place])    with penicillin    Patient Active Problem List   Diagnosis Date Noted  . Acute respiratory failure with hypoxia ([place]) 09/24/2019  . Long term (current) use of anticoagulants 08/06/2019  . DVT (deep venous thrombosis) ([place]) 08/04/2019  . Hypertensive retinopathy 06/30/2019  . Pain due to onychomycosis of toenails of both feet 05/28/2019  . Hyperlipidemia associated with type 2 diabetes mellitus ([place]) 05/15/2019  . Type 2 diabetes mellitus without complication, without long-term current use of insulin ([place]) 05/13/2019  . Cardiac amyloidosis ([place]) 07/21/2018  . Chronic systolic heart failure ([place]) 06/17/2018  . Bilateral pulmonary embolism ([place]) 05/12/2018  . Heart failure ([place]) 05/11/2018  . Claudication in peripheral vascular disease ([place]) 12/31/2017  . Decreased hearing of both ears 11/10/2017  . Cervical disc disease with myelopathy 07/10/2016  . NICM (nonischemic cardiomyopathy) ([place]) 03/12/2016  . Congestive dilated cardiomyopathy ([place]) 03/11/2016  . Cervical radiculitis 04/19/2015  . Essential hypertension 09/27/2014    Past Surgical History:  Procedure Laterality Date  . ANTERIOR CERVICAL DECOMP/DISCECTOMY FUSION N/A 07/10/2016   Procedure: ANTERIOR CERVICAL DECOMPRESSION FUSION CERVICAL 4-5, CERVICAL 5-6, CERVICAL 6-7 WITH INSTRUMENTATION AND ALLOGRAFT;  Surgeon: [name], MD;  Location: [place];  Service: Orthopedics;  Laterality: N/A;  . CARDIAC CATHETERIZATION N/A 03/11/2016   Procedure: Left Heart Cath and Coronary Angiography;  Surgeon: [name], MD;  Location: [place] [facility];  Service: Cardiovascular;  Laterality: N/A;  . ICD IMPLANT N/A 06/17/2018   Procedure: ICD IMPLANT;  Surgeon: [name], MD;  Location: [place] [facility];  Service:  Cardiovascular;  Laterality: N/A;  . Thumb surgery Right        Family History  Problem Relation Age of Onset  . Diabetes Mother   . Diabetes Sister     Social History   Tobacco Use  . Smoking status: Former Smoker    Packs/day: 1.50    Years: 8.00    Pack years: 12.00    Quit date: 03/15/2003    Years since quitting: 16.5  . Smokeless tobacco: Never Used  Substance Use Topics  . Alcohol use: No    Comment: none since 2004  . Drug use: No    Comment: former  none since 2004    Home Medications Prior to Admission medications   Medication Sig Start Date End Date Taking? Authorizing Provider  warfarin (COUMADIN) 5 MG tablet Take 1 to 2 tablets by mouth daily as directed Patient taking differently: Take 7.5 mg by mouth daily.  08/30/19  Yes Lorretta Harp, MD  amitriptyline (ELAVIL) 25 MG tablet TAKE 1 TABLET(25 MG) BY MOUTH AT BEDTIME AS NEEDED FOR SLEEP Patient taking differently: Take 25 mg by mouth at bedtime as needed for sleep.  06/23/19   Ladell Pier, MD  atorvastatin (LIPITOR) 20 MG tablet Take 1 tablet (20 mg total) by mouth daily. 05/15/19   Ladell Pier, MD  carvedilol (COREG) 25 MG tablet Take 1 tablet (25 mg total) by mouth 2 (two) times daily with a meal. 12/29/18   Camnitz, Ocie Doyne, MD  Chlorphen-Phenyleph-ASA (ALKA-SELTZER PLUS COLD PO) Take 1-2 tablets by mouth 2 (two) times daily as needed (for present symptoms).    [provider]  enoxaparin (LOVENOX) 80 MG/0.8ML injection Inject 0.8 mLs (80 mg total) into the skin every 12 (twelve) hours for 10 days. Patient not taking: Reported on 09/17/2019 08/05/19 08/15/19  Hosie Poisson, MD  furosemide (LASIX) 40 MG tablet Take 1 tablet (40 mg total) by mouth daily as needed for fluid (if weight gain more than 3 punds). Patient not taking: Reported on 09/17/2019 05/14/18 08/06/19  Jonetta Osgood, MD  hydrALAZINE (APRESOLINE) 50 MG tablet TAKE 1 TABLET(50 MG) BY MOUTH THREE TIMES DAILY. FOLLOW  UP APPOINTMENT PLEASE Patient taking differently: Take 50 mg by mouth 3 (three) times daily.  09/06/19   Lorretta Harp, MD  metFORMIN (GLUCOPHAGE) 500 MG tablet Take 1 tablet (500 mg total) by mouth daily with breakfast. 05/13/19   Ladell Pier, MD  methocarbamol (ROBAXIN) 500 MG tablet Take 1 tablet (500 mg total) by mouth every 8 (eight) hours as needed for muscle spasms. 09/17/19   Sherwood Gambler, MD  sacubitril-valsartan (ENTRESTO) 97-103 MG Take 1 tablet by mouth 2 (two) times daily. 03/25/19   Argentina Donovan, PA-C  sildenafil (VIAGRA) 50 MG tablet TAKE 1 TABLET BY MOUTH EVERY DAIY AS NEEDED FOR ERECTILE DYSFUNCTION Patient taking differently: Take 50 mg by mouth daily as needed for erectile dysfunction.  03/25/19   Argentina Donovan, PA-C  spironolactone (ALDACTONE) 25 MG tablet Take 0.5 tablets (12.5 mg total) by mouth daily. 06/03/19   Bensimhon, Shaune Pascal, MD  Tafamidis Meglumine, Cardiac, (VYNDAQEL) 20 MG  CAPS Take 80 mg by mouth daily. 05/21/19   Bensimhon, Shaune Pascal, MD    Allergies    Peanut-containing drug products, Penicillins, and Decadron [dexamethasone]  Review of Systems   Review of Systems  Constitutional: Positive for chills, fatigue and fever. Negative for appetite change, diaphoresis and unexpected weight change.  HENT: Negative for mouth sores.   Eyes: Negative for visual disturbance.  Respiratory: Positive for cough, chest tightness and shortness of breath. Negative for wheezing.   Cardiovascular: Positive for chest pain. Negative for leg swelling.  Gastrointestinal: Positive for nausea and vomiting. Negative for abdominal pain, constipation and diarrhea.  Endocrine: Negative for polydipsia, polyphagia and polyuria.  Genitourinary: Negative for dysuria, frequency, hematuria and urgency.  Musculoskeletal: Negative for back pain and neck stiffness.  Skin: Negative for rash.  Allergic/Immunologic: Negative for immunocompromised state.  Neurological: Positive for  light-headedness and headaches. Negative for syncope.  Hematological: Does not bruise/bleed easily.  Psychiatric/Behavioral: Negative for sleep disturbance. The patient is not nervous/anxious.     Physical Exam Updated Vital Signs BP 103/77 (BP Location: Right Arm)   Pulse 87   Temp 98.6 F (37 C) (Oral)   Resp (!) 24   Ht 5\' 6"  (1.676 m)   Wt 78.9 kg   SpO2 95%   BMI 28.08 kg/m   Physical Exam Vitals and nursing note reviewed.  Constitutional:      General: He is not in acute distress.    Appearance: He is not diaphoretic.  HENT:     Head: Normocephalic.  Eyes:     General: No scleral icterus.    Conjunctiva/sclera: Conjunctivae normal.  Cardiovascular:     Rate and Rhythm: Normal rate and regular rhythm.     Pulses: Normal pulses.          Radial pulses are 2+ on the right side and 2+ on the left side.  Pulmonary:     Effort: Tachypnea and accessory muscle usage present. No prolonged expiration, respiratory distress or retractions.     Breath sounds: No stridor. Rhonchi ( throughout) and rales ( throughout) present.     Comments: Equal chest rise. Moderate increased work of breathing. Abdominal:     General: There is no distension.     Palpations: Abdomen is soft.     Tenderness: There is no abdominal tenderness. There is no guarding or rebound.  Musculoskeletal:     Cervical back: Normal range of motion.     Comments: Moves all extremities equally and without difficulty.  Skin:    General: Skin is warm and dry.     Capillary Refill: Capillary refill takes less than 2 seconds.  Neurological:     Mental Status: He is alert.     GCS: GCS eye subscore is 4. GCS verbal subscore is 5. GCS motor subscore is 6.     Comments: Speech is clear and goal oriented.  Psychiatric:        Mood and Affect: Mood normal.     ED Results / Procedures / Treatments   Labs (all labs ordered are listed, but only abnormal results are displayed) Labs Reviewed  BASIC METABOLIC PANEL  - Abnormal; Notable for the following components:      Result Value   Sodium 134 (*)    CO2 19 (*)    Glucose, Bld 120 (*)    BUN 26 (*)    Creatinine, Ser 1.31 (*)    GFR calc non Af Amer 57 (*)    All  other components within normal limits  HEPATIC FUNCTION PANEL - Abnormal; Notable for the following components:   Albumin 3.3 (*)    AST 58 (*)    ALT 92 (*)    All other components within normal limits  D-DIMER, QUANTITATIVE (NOT AT [facility]) - Abnormal; Notable for the following components:   D-Dimer, Quant 0.61 (*)    All other components within normal limits  LACTATE DEHYDROGENASE - Abnormal; Notable for the following components:   LDH 264 (*)    All other components within normal limits  TRIGLYCERIDES - Abnormal; Notable for the following components:   Triglycerides 160 (*)    All other components within normal limits  FIBRINOGEN - Abnormal; Notable for the following components:   Fibrinogen 692 (*)    All other components within normal limits  C-REACTIVE PROTEIN - Abnormal; Notable for the following components:   CRP 7.1 (*)    All other components within normal limits  PROTIME-INR - Abnormal; Notable for the following components:   Prothrombin Time 32.3 (*)    INR 3.2 (*)    All other components within normal limits  HEMOGLOBIN A1C - Abnormal; Notable for the following components:   Hgb A1c MFr Bld 7.1 (*)    All other components within normal limits  SARS CORONAVIRUS 2 (TAT 6-24 HRS)  CULTURE, BLOOD (ROUTINE X 2)  CULTURE, BLOOD (ROUTINE X 2)  RESPIRATORY PANEL BY PCR  CBC  BRAIN NATRIURETIC PEPTIDE  LACTIC ACID, PLASMA  LACTIC ACID, PLASMA  PROCALCITONIN  FERRITIN  PROTIME-INR  POC SARS CORONAVIRUS 2 AG -  ED  ABO/RH  TROPONIN I (HIGH SENSITIVITY)  TROPONIN I (HIGH SENSITIVITY)    EKG EKG Interpretation  Date/Time:  Thursday September 23 2019 22:23:49 EST Ventricular Rate:  86 PR Interval:    QRS Duration: 104 QT Interval:  384 QTC Calculation: 460 R  Axis:   69 Text Interpretation: Sinus rhythm Ventricular premature complex No STEMI Confirmed by Nanda Quinton [phone]) on 09/23/2019 10:33:19 PM   Radiology DG Chest 2 View  Result Date: 09/23/2019 CLINICAL DATA:  63 year old male with chest pain. EXAM: CHEST - 2 VIEW COMPARISON:  Chest radiograph dated 09/13/2019. FINDINGS: Minimal left lung base hazy densities, likely atelectasis. No focal consolidation, pleural effusion, or pneumothorax. Stable cardiac silhouette. Left pectoral AICD device. No acute osseous pathology. IMPRESSION: No active cardiopulmonary disease. Electronically Signed   By: Anner Crete M.D.   On: 09/23/2019 22:49    Procedures Procedures (including critical care time)  Medications Ordered in ED Medications  sodium chloride flush (NS) 0.9 % injection 3 mL (3 mLs Intravenous Not Given 09/24/19 0433)  atorvastatin (LIPITOR) tablet 20 mg (has no administration in time range)  amitriptyline (ELAVIL) tablet 25 mg (has no administration in time range)  albuterol (VENTOLIN HFA) 108 (90 Base) MCG/ACT inhaler 2 puff (2 puffs Inhalation Given 09/24/19 0432)  acetaminophen (TYLENOL) tablet 650 mg (has no administration in time range)  ondansetron (ZOFRAN) tablet 4 mg (has no administration in time range)    Or  ondansetron (ZOFRAN) injection 4 mg (has no administration in time range)  insulin aspart (novoLOG) injection 0-15 Units (has no administration in time range)  insulin aspart (novoLOG) injection 0-5 Units (has no administration in time range)    ED Course  I have reviewed the triage vital signs and the nursing notes.  Pertinent labs & imaging results that were available during my care of the patient were reviewed by me and considered in my medical  decision making (see chart for details).  Clinical Course as of Sep 24 647  Fri Sep 24, 2019  0212 Pt with SPO2 of 88% with ambulation.  Placed on 1L via [place]   [HM]  0220 Mild AKI  Creatinine(!): 1.31 [HM]  0221  Likely elevated AST/ALT.  AST(!): 58 [HM]  0221 Within normal limits.  Patient has had elevated levels in the past.  Less likely to be CHF today.  B Natriuretic Peptide: 40.0 [HM]  0221 Antigen test negative however given collection of symptoms and other labs, highly suspicious for Covid.  SARS Coronavirus 2 Ag: NEGATIVE [HM]  0319 INR is not subtherapeutic today.  INR(!): 3.2 [HM]  0319 Slightly elevated D-dimer.  D-Dimer, Quant(!): 0.61 [HM]    Clinical Course User Index [HM] Terrin Meddaugh, Gwenlyn Perking   MDM Rules/Calculators/A&P                       Jonathan Dickson was evaluated in Emergency Department on 09/24/2019 for the symptoms described in the history of present illness. He was evaluated in the context of the global COVID-19 pandemic, which necessitated consideration that the patient might be at risk for infection with the SARS-CoV-2 virus that causes COVID-19. Institutional protocols and algorithms that pertain to the evaluation of patients at risk for COVID-19 are in a state of rapid change based on information released by regulatory bodies including the CDC and federal and state organizations. These policies and algorithms were followed during the patient's care in the ED.  Patient presents for Covid-like symptoms.  Additionally given his significant tachypnea, concern for possible pulmonary embolism.  He reports compliance with his medications however was subtherapeutic on his INR on 09/20/2019.  Patient desaturates to 88% with ambulation in the room and becomes even more tachypneic.  He is not tachycardic.  Breath sounds with rhonchi and rales throughout.  Given his reported fevers at home, lack of tachycardia I suspect Covid is his etiology however will obtain CT angiogram for further evaluation of lung tissue and to evaluate for possible pulmonary embolism.  Antigen test negative, PCR pending.  Regardless, patient will need to be admitted.  Discussed with Dr. Alcario Drought, [facility]  hospitalist who will admit.  Final Clinical Impression(s) / ED Diagnoses Final diagnoses:  Suspected COVID-19 virus infection  Chest pain, unspecified type  Shortness of breath  Hypoxia    Rx / DC Orders ED Discharge Orders    None       Rasa Degrazia, Gwenlyn Perking 09/24/19 [place], Delice Bison, DO 09/24/19 [phone]

## 2019-09-24 ENCOUNTER — Encounter (HOSPITAL_COMMUNITY): Payer: Self-pay | Admitting: Internal Medicine

## 2019-09-24 ENCOUNTER — Inpatient Hospital Stay (HOSPITAL_COMMUNITY): Payer: Medicaid Other

## 2019-09-24 ENCOUNTER — Other Ambulatory Visit (HOSPITAL_COMMUNITY): Payer: Self-pay | Admitting: Internal Medicine

## 2019-09-24 DIAGNOSIS — Z86711 Personal history of pulmonary embolism: Secondary | ICD-10-CM | POA: Diagnosis not present

## 2019-09-24 DIAGNOSIS — I10 Essential (primary) hypertension: Secondary | ICD-10-CM

## 2019-09-24 DIAGNOSIS — I5022 Chronic systolic (congestive) heart failure: Secondary | ICD-10-CM

## 2019-09-24 DIAGNOSIS — Z5329 Procedure and treatment not carried out because of patient's decision for other reasons: Secondary | ICD-10-CM | POA: Diagnosis present

## 2019-09-24 DIAGNOSIS — R079 Chest pain, unspecified: Secondary | ICD-10-CM | POA: Diagnosis not present

## 2019-09-24 DIAGNOSIS — I43 Cardiomyopathy in diseases classified elsewhere: Secondary | ICD-10-CM

## 2019-09-24 DIAGNOSIS — E119 Type 2 diabetes mellitus without complications: Secondary | ICD-10-CM

## 2019-09-24 DIAGNOSIS — Z20828 Contact with and (suspected) exposure to other viral communicable diseases: Secondary | ICD-10-CM | POA: Diagnosis not present

## 2019-09-24 DIAGNOSIS — E854 Organ-limited amyloidosis: Secondary | ICD-10-CM

## 2019-09-24 DIAGNOSIS — Z880 Allergy status to penicillin: Secondary | ICD-10-CM | POA: Diagnosis not present

## 2019-09-24 DIAGNOSIS — Z981 Arthrodesis status: Secondary | ICD-10-CM | POA: Diagnosis not present

## 2019-09-24 DIAGNOSIS — J1289 Other viral pneumonia: Secondary | ICD-10-CM | POA: Diagnosis present

## 2019-09-24 DIAGNOSIS — Z7901 Long term (current) use of anticoagulants: Secondary | ICD-10-CM | POA: Diagnosis not present

## 2019-09-24 DIAGNOSIS — Z91010 Allergy to peanuts: Secondary | ICD-10-CM | POA: Diagnosis not present

## 2019-09-24 DIAGNOSIS — R0602 Shortness of breath: Secondary | ICD-10-CM

## 2019-09-24 DIAGNOSIS — J9601 Acute respiratory failure with hypoxia: Secondary | ICD-10-CM | POA: Diagnosis not present

## 2019-09-24 DIAGNOSIS — Z95810 Presence of automatic (implantable) cardiac defibrillator: Secondary | ICD-10-CM | POA: Diagnosis not present

## 2019-09-24 DIAGNOSIS — U071 COVID-19: Secondary | ICD-10-CM | POA: Diagnosis present

## 2019-09-24 DIAGNOSIS — Z833 Family history of diabetes mellitus: Secondary | ICD-10-CM | POA: Diagnosis not present

## 2019-09-24 DIAGNOSIS — I110 Hypertensive heart disease with heart failure: Secondary | ICD-10-CM | POA: Diagnosis present

## 2019-09-24 DIAGNOSIS — I824Y9 Acute embolism and thrombosis of unspecified deep veins of unspecified proximal lower extremity: Secondary | ICD-10-CM

## 2019-09-24 DIAGNOSIS — Z86718 Personal history of other venous thrombosis and embolism: Secondary | ICD-10-CM | POA: Diagnosis not present

## 2019-09-24 LAB — TRIGLYCERIDES: Triglycerides: 160 mg/dL — ABNORMAL HIGH (ref <150)

## 2019-09-24 LAB — HEPATIC FUNCTION PANEL
ALT: 92 U/L — ABNORMAL HIGH (ref 0–44)
AST: 58 U/L — ABNORMAL HIGH (ref 15–41)
Albumin: 3.3 g/dL — ABNORMAL LOW (ref 3.5–5.0)
Alkaline Phosphatase: 80 U/L (ref 38–126)
Bilirubin, Direct: 0.2 mg/dL (ref 0.0–0.2)
Indirect Bilirubin: 0.6 mg/dL (ref 0.3–0.9)
Total Bilirubin: 0.8 mg/dL (ref 0.3–1.2)
Total Protein: 6.9 g/dL (ref 6.5–8.1)

## 2019-09-24 LAB — PROTIME-INR
INR: 3.2 — ABNORMAL HIGH (ref 0.8–1.2)
INR: 3.2 — ABNORMAL HIGH (ref 0.8–1.2)
Prothrombin Time: 32.3 seconds — ABNORMAL HIGH (ref 11.4–15.2)
Prothrombin Time: 32.4 seconds — ABNORMAL HIGH (ref 11.4–15.2)

## 2019-09-24 LAB — HEMOGLOBIN A1C
Hgb A1c MFr Bld: 7.1 % — ABNORMAL HIGH (ref 4.8–5.6)
Mean Plasma Glucose: 157.07 mg/dL

## 2019-09-24 LAB — CBG MONITORING, ED
Glucose-Capillary: 92 mg/dL (ref 70–99)
Glucose-Capillary: 99 mg/dL (ref 70–99)
Glucose-Capillary: 97 mg/dL (ref 70–99)
Glucose-Capillary: 121 mg/dL — ABNORMAL HIGH (ref 70–99)

## 2019-09-24 LAB — LACTATE DEHYDROGENASE: LDH: 264 U/L — ABNORMAL HIGH (ref 98–192)

## 2019-09-24 LAB — FIBRINOGEN: Fibrinogen: 692 mg/dL — ABNORMAL HIGH (ref 210–475)

## 2019-09-24 LAB — TROPONIN I (HIGH SENSITIVITY): Troponin I (High Sensitivity): 12 ng/L (ref <18)

## 2019-09-24 LAB — ABO/RH: ABO/RH(D): A POS

## 2019-09-24 LAB — C-REACTIVE PROTEIN: CRP: 7.1 mg/dL — ABNORMAL HIGH (ref <1.0)

## 2019-09-24 LAB — D-DIMER, QUANTITATIVE: D-Dimer, Quant: 0.61 ug/mL-FEU — ABNORMAL HIGH (ref 0.00–0.50)

## 2019-09-24 LAB — LACTIC ACID, PLASMA
Lactic Acid, Venous: 1.5 mmol/L (ref 0.5–1.9)
Lactic Acid, Venous: 1.3 mmol/L (ref 0.5–1.9)

## 2019-09-24 LAB — FERRITIN: Ferritin: 237 ng/mL (ref 24–336)

## 2019-09-24 LAB — POC SARS CORONAVIRUS 2 AG -  ED: SARS Coronavirus 2 Ag: NEGATIVE

## 2019-09-24 LAB — PROCALCITONIN: Procalcitonin: <0.1 ng/mL

## 2019-09-24 LAB — SARS CORONAVIRUS 2 (TAT 6-24 HRS): SARS Coronavirus 2: POSITIVE — AB

## 2019-09-24 MED ORDER — INSULIN ASPART 100 UNIT/ML ~~LOC~~ SOLN: 0.0000 [IU] | Freq: Every day | SUBCUTANEOUS | Status: DC

## 2019-09-24 MED ORDER — METHOCARBAMOL 500 MG PO TABS
500.0000 mg | ORAL_TABLET | Freq: Three times a day (TID) | ORAL | Status: DC | PRN
  Administered 2019-09-24: 19:00:00 500 mg via ORAL
  Filled 2019-09-24: qty 1

## 2019-09-24 MED ORDER — WARFARIN - PHARMACIST DOSING INPATIENT: Freq: Every day | Status: DC

## 2019-09-24 MED ORDER — IOHEXOL 350 MG/ML SOLN
80.0000 mL | Freq: Once | INTRAVENOUS | Status: AC | PRN
  Administered 2019-09-24: 09:00:00 80 mL via INTRAVENOUS

## 2019-09-24 MED ORDER — ATORVASTATIN CALCIUM 10 MG PO TABS
20.0000 mg | ORAL_TABLET | Freq: Every day | ORAL | Status: DC
  Administered 2019-09-24: 18:00:00 20 mg via ORAL
  Filled 2019-09-24: qty 2

## 2019-09-24 MED ORDER — ONDANSETRON HCL 4 MG/2ML IJ SOLN
4.0000 mg | Freq: Four times a day (QID) | INTRAMUSCULAR | Status: DC | PRN
  Administered 2019-09-24: 15:00:00 4 mg via INTRAVENOUS
  Filled 2019-09-24: qty 2

## 2019-09-24 MED ORDER — SODIUM CHLORIDE 0.9 % IV SOLN
200.0000 mg | Freq: Once | INTRAVENOUS | Status: AC
  Administered 2019-09-24: 200 mg via INTRAVENOUS
  Filled 2019-09-24: qty 40

## 2019-09-24 MED ORDER — SPIRONOLACTONE 12.5 MG HALF TABLET
12.5000 mg | ORAL_TABLET | Freq: Every day | ORAL | Status: DC
  Administered 2019-09-25: 08:00:00 12.5 mg via ORAL
  Filled 2019-09-24: qty 1

## 2019-09-24 MED ORDER — SODIUM CHLORIDE 0.9 % IV SOLN
100.0000 mg | Freq: Every day | INTRAVENOUS | Status: DC
  Administered 2019-09-25: 08:00:00 100 mg via INTRAVENOUS
  Filled 2019-09-24: qty 20

## 2019-09-24 MED ORDER — INSULIN ASPART 100 UNIT/ML ~~LOC~~ SOLN
0.0000 [IU] | Freq: Three times a day (TID) | SUBCUTANEOUS | Status: DC
  Administered 2019-09-25: 2 [IU] via SUBCUTANEOUS

## 2019-09-24 MED ORDER — TAFAMIDIS MEGLUMINE (CARDIAC) 20 MG PO CAPS: 80.0000 mg | ORAL_CAPSULE | Freq: Every day | ORAL | Status: DC

## 2019-09-24 MED ORDER — HYDRALAZINE HCL 25 MG PO TABS
50.0000 mg | ORAL_TABLET | Freq: Three times a day (TID) | ORAL | Status: DC
  Administered 2019-09-24 – 2019-09-25 (×3): 50 mg via ORAL
  Filled 2019-09-24 (×3): qty 2

## 2019-09-24 MED ORDER — AMITRIPTYLINE HCL 25 MG PO TABS: 25.0000 mg | ORAL_TABLET | Freq: Every evening | ORAL | Status: DC | PRN

## 2019-09-24 MED ORDER — WARFARIN SODIUM 5 MG PO TABS
5.0000 mg | ORAL_TABLET | Freq: Once | ORAL | Status: AC
  Administered 2019-09-24: 18:00:00 5 mg via ORAL
  Filled 2019-09-24: qty 1

## 2019-09-24 MED ORDER — ONDANSETRON HCL 4 MG PO TABS
4.0000 mg | ORAL_TABLET | Freq: Four times a day (QID) | ORAL | Status: DC | PRN
  Administered 2019-09-24: 4 mg via ORAL
  Filled 2019-09-24: qty 1

## 2019-09-24 MED ORDER — ACETAMINOPHEN 325 MG PO TABS
650.0000 mg | ORAL_TABLET | Freq: Four times a day (QID) | ORAL | Status: DC | PRN
  Administered 2019-09-24 – 2019-09-25 (×3): 650 mg via ORAL
  Filled 2019-09-24 (×3): qty 2

## 2019-09-24 MED ORDER — ALBUTEROL SULFATE HFA 108 (90 BASE) MCG/ACT IN AERS
2.0000 | INHALATION_SPRAY | Freq: Four times a day (QID) | RESPIRATORY_TRACT | Status: DC
  Administered 2019-09-24 – 2019-09-25 (×6): 2 via RESPIRATORY_TRACT
  Filled 2019-09-24 (×3): qty 6.7

## 2019-09-24 NOTE — Progress Notes (Addendum)
COVID has come back positive.  CT is negative for PE.  1) ordering remdesivir per pharm 2) Satting 97% on just 2L currently, hypoxia only with exertion on RA (not rest), on the other hand has significant CHF history, DM, and a listed allergy (itching) to decadron, will defer decadron for the moment. 3) Actemra deferred due to LFTs being elevated.

## 2019-09-24 NOTE — ED Notes (Signed)
Patient is resting comfortably. 

## 2019-09-24 NOTE — ED Notes (Signed)
Pt has been resting comfortably without calling out for needs.  Has eaten most of his meals, but states he has been unable to keep anything down.  Some emesis noted on the floor with small amt of food particles.  Pt denies any other needs at his time.  No gross resp distress and able to re-position in bed.

## 2019-09-24 NOTE — ED Notes (Signed)
Pt also c/o pain to L lateral chest wall.  Reviewed with patient that he needs to let the RN know about his sx.  Pt understands.

## 2019-09-24 NOTE — ED Notes (Signed)
Pt returns from CT with increased difficulty breathing from moving stretcher to table.  Able to resolve within minutes.

## 2019-09-24 NOTE — Progress Notes (Signed)
ANTICOAGULATION CONSULT NOTE - Initial Consult  Pharmacy Consult for coumadin Indication: VTE  Allergies  Allergen Reactions  . Peanut-Containing Drug Products Swelling  . Penicillins Other (See Comments)    CONVULSIONS Has patient had a PCN reaction causing immediate rash, facial/tongue/throat swelling, SOB or lightheadedness with hypotension: No Has patient had a PCN reaction causing severe rash involving mucus membranes or skin necrosis: No Has patient had a PCN reaction that required hospitalization No Has patient had a PCN reaction occurring within the last 10 years: No If all of the above answers are "NO", then may proceed with Cephalosporin use.   . Decadron [Dexamethasone] Itching    Patient Measurements: Height: 5\' 6"  (167.6 cm) Weight: 174 lb (78.9 kg) IBW/kg (Calculated) : 63.8   Vital Signs: Temp: 98.1 F (36.7 C) (12/25 1258) Temp Source: Rectal (12/25 0209) BP: 135/81 (12/25 1258) Pulse Rate: 97 (12/25 1258)  Labs: Recent Labs    09/23/19 2228 09/24/19 0112 09/24/19 0238  HGB 13.5  --   --   HCT 42.6  --   --   PLT 250  --   --   LABPROT  --   --  32.3*  INR  --   --  3.2*  CREATININE 1.31*  --   --   TROPONINIHS 13 12  --     Estimated Creatinine Clearance: 56.2 mL/min (A) (by C-G formula based on SCr of 1.31 mg/dL (H)).   Medical History: Past Medical History:  Diagnosis Date  . Abnormal TSH 02/2016  . Anxiety   . Cervical disc disease   . Chronic combined systolic and diastolic CHF (congestive heart failure) ([place])    a. 01/2016 Echo: EF 25%, inf AK, diffuse sev HK, Gr1 DD, mild MR.  . Depression   . Hyperglycemia   . Hypertension   . Hypertensive heart disease   . Myocardial infarction [facility])    ??  maybe  . NICM (nonischemic cardiomyopathy) ([place])    a. 01/2016 Echo: EF 25%, inf AK, diffuse sev HK, Gr1 DD, mild MR; b. 01/2016 MV: EF 22%, no isch/infarct;  c. 02/2016 Cath: Nl cors, EF 35-45%.  . Pre-diabetes   . Seizures ([place])    with  penicillin    Medications:  Coumadin 7.5 mg daily, last dose 12/24  Assessment: 63 yo man to continue coumadin from home.  INR 3.2 Goal of Therapy:  INR 2-3 Monitor platelets by anticoagulation protocol: Yes   Plan:  - INR slightly supra-therapeutic at 3.2  - Will give Warfarin 5 mg PO X 1 dose  - Check PT/INR daily - Monitor for bleeding complications  Duanne Limerick PharmD. BCPS  09/24/2019,1:11 PM

## 2019-09-24 NOTE — ED Notes (Signed)
Lunch Tray Ordered @ 1033. 

## 2019-09-24 NOTE — ED Notes (Addendum)
Pt ambulated in room off 02 Hr was 88 and 02 was 95. Lowest 02 92 pt did well no big drop in 02 and hr remained 85 to 88 no higher. Pt is back in bed resting.

## 2019-09-24 NOTE — H&P (Signed)
History and Physical    Jonathan Dickson [id number] DOB: 11-16-1955 DOA: 09/23/2019  PCP: Ladell Pier, MD  Patient coming from: Home  I have personally briefly reviewed patient's old medical records in [place]  Chief Complaint: SOB, CP  HPI: Jonathan Dickson is a 63 y.o. 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  Patient presents to ED with c/o gradually onset, persistent, and progressively worsening SOB onset 6 days ago.  Reports cough, fever, SOB, CP, N/V.  Nothing makes better or worse.  No known sick contacts.  ED Course: COVID pending, CTA chest pending.  INR 1.7 on 09/20/2019 is subtherapeutic.  INR was low on 09/17/2019 but was therapeutic on 09/13/2019 at 2.8.  INR today supratheraputic at 3.2.  D.Dimer 0.61.  Procalcitonin and lactate neg.  CXR neg, WBC nl.  Trop nl x2, BNP 40.  Desats to 88 with ambulation in ED.   Review of Systems: As per HPI, otherwise all review of systems negative.  Past Medical History:  Diagnosis Date  . Abnormal TSH 02/2016  . Anxiety   . Cervical disc disease   . Chronic combined systolic and diastolic CHF (congestive heart failure) ([place])    a. 01/2016 Echo: EF 25%, inf AK, diffuse sev HK, Gr1 DD, mild MR.  . Depression   . Hyperglycemia   . Hypertension   . Hypertensive heart disease   . Myocardial infarction [facility])    ??  maybe  . NICM (nonischemic cardiomyopathy) ([place])    a. 01/2016 Echo: EF 25%, inf AK, diffuse sev HK, Gr1 DD, mild MR; b. 01/2016 MV: EF 22%, no isch/infarct;  c. 02/2016 Cath: Nl cors, EF 35-45%.  . Pre-diabetes   . Seizures ([place])    with penicillin    Past Surgical History:  Procedure Laterality Date  . ANTERIOR CERVICAL DECOMP/DISCECTOMY FUSION N/A 07/10/2016   Procedure: ANTERIOR CERVICAL  DECOMPRESSION FUSION CERVICAL 4-5, CERVICAL 5-6, CERVICAL 6-7 WITH INSTRUMENTATION AND ALLOGRAFT;  Surgeon: [name], MD;  Location: [facility];  Service: Orthopedics;  Laterality: N/A;  . CARDIAC CATHETERIZATION N/A 03/11/2016   Procedure: Left Heart Cath and Coronary Angiography;  Surgeon: [name], MD;  Location: [place];  Service: Cardiovascular;  Laterality: N/A;  . ICD IMPLANT N/A 06/17/2018   Procedure: ICD IMPLANT;  Surgeon: [name], MD;  Location: [place];  Service: Cardiovascular;  Laterality: N/A;  . Thumb surgery Right      reports that he quit smoking about 16 years ago. He has a 12.00 pack-year smoking history. He has never used smokeless tobacco. He reports that he does not drink alcohol or use drugs.  Allergies  Allergen Reactions  . Peanut-Containing Drug Products Swelling  . Penicillins Other (See Comments)    CONVULSIONS Has patient had a PCN reaction causing immediate rash, facial/tongue/throat swelling, SOB or lightheadedness with hypotension: No Has patient had a PCN reaction causing severe rash involving mucus membranes or skin necrosis: No Has patient had a PCN reaction that required hospitalization No Has patient had a PCN reaction occurring within the last 10 years: No If all of the above answers are "NO", then may proceed with Cephalosporin use.   . Decadron [Dexamethasone] Itching    Family History  Problem Relation Age of Onset  .  Diabetes Mother   . Diabetes Sister      Prior to Admission medications   Medication Sig Start Date End Date Taking? Authorizing Provider  amitriptyline (ELAVIL) 25 MG tablet TAKE 1 TABLET(25 MG) BY MOUTH AT BEDTIME AS NEEDED FOR SLEEP Patient taking differently: Take 25 mg by mouth at bedtime as needed for sleep.  06/23/19   Ladell Pier, MD  atorvastatin (LIPITOR) 20 MG tablet Take 1 tablet (20 mg total) by mouth daily. 05/15/19   Ladell Pier, MD  carvedilol (COREG) 25 MG  tablet Take 1 tablet (25 mg total) by mouth 2 (two) times daily with a meal. 12/29/18   Camnitz, Ocie Doyne, MD  Chlorphen-Phenyleph-ASA (ALKA-SELTZER PLUS COLD PO) Take 1-2 tablets by mouth 2 (two) times daily as needed (for present symptoms).    [provider]  enoxaparin (LOVENOX) 80 MG/0.8ML injection Inject 0.8 mLs (80 mg total) into the skin every 12 (twelve) hours for 10 days. Patient not taking: Reported on 09/17/2019 08/05/19 08/15/19  Hosie Poisson, MD  furosemide (LASIX) 40 MG tablet Take 1 tablet (40 mg total) by mouth daily as needed for fluid (if weight gain more than 3 punds). Patient not taking: Reported on 09/17/2019 05/14/18 08/06/19  Jonetta Osgood, MD  hydrALAZINE (APRESOLINE) 50 MG tablet TAKE 1 TABLET(50 MG) BY MOUTH THREE TIMES DAILY. FOLLOW UP APPOINTMENT PLEASE Patient taking differently: Take 50 mg by mouth 3 (three) times daily.  09/06/19   Lorretta Harp, MD  metFORMIN (GLUCOPHAGE) 500 MG tablet Take 1 tablet (500 mg total) by mouth daily with breakfast. 05/13/19   Ladell Pier, MD  methocarbamol (ROBAXIN) 500 MG tablet Take 1 tablet (500 mg total) by mouth every 8 (eight) hours as needed for muscle spasms. 09/17/19   Sherwood Gambler, MD  sacubitril-valsartan (ENTRESTO) 97-103 MG Take 1 tablet by mouth 2 (two) times daily. 03/25/19   Argentina Donovan, PA-C  sildenafil (VIAGRA) 50 MG tablet TAKE 1 TABLET BY MOUTH EVERY DAIY AS NEEDED FOR ERECTILE DYSFUNCTION Patient taking differently: Take 50 mg by mouth daily as needed for erectile dysfunction.  03/25/19   Argentina Donovan, PA-C  spironolactone (ALDACTONE) 25 MG tablet Take 0.5 tablets (12.5 mg total) by mouth daily. 06/03/19   Bensimhon, Shaune Pascal, MD  Tafamidis Meglumine, Cardiac, (VYNDAQEL) 20 MG CAPS Take 80 mg by mouth daily. 05/21/19   Bensimhon, Shaune Pascal, MD  warfarin (COUMADIN) 5 MG tablet Take 1 to 2 tablets by mouth daily as directed 08/30/19   Lorretta Harp, MD    Physical Exam: Vitals:    09/24/19 0230 09/24/19 0245 09/24/19 0300 09/24/19 0315  BP: 110/81 (!) 149/84 118/82 126/81  Pulse: 86 86 81 88  Resp: (!) 30 (!) 26  (!) 24  Temp:      TempSrc:      SpO2: 97% 96% 96% 98%  Weight:      Height:        Constitutional: NAD, calm, comfortable Eyes: PERRL, lids and conjunctivae normal ENMT: Mucous membranes are moist. Posterior pharynx clear of any exudate or lesions.Normal dentition.  Neck: normal, supple, no masses, no thyromegaly Respiratory: Tachypnea and coarse breath sounds. Cardiovascular: Regular rate and rhythm, no murmurs / rubs / gallops. No extremity edema. 2+ pedal pulses. No carotid bruits.  Abdomen: no tenderness, no masses palpated. No hepatosplenomegaly. Bowel sounds positive.  Musculoskeletal: no clubbing / cyanosis. No joint deformity upper and lower extremities. Good ROM, no contractures. Normal muscle tone.  Skin:  no rashes, lesions, ulcers. No induration Neurologic: CN 2-12 grossly intact. Sensation intact, DTR normal. Strength 5/5 in all 4.  Psychiatric: Normal judgment and insight. Alert and oriented x 3. Normal mood.    Labs on Admission: I have personally reviewed following labs and imaging studies  CBC: Recent Labs  Lab 09/17/19 0645 09/23/19 2228  WBC 5.0 6.1  HGB 13.7 13.5  HCT 43.3 42.6  MCV 87.8 86.6  PLT 199 [id number]   Basic Metabolic Panel: Recent Labs  Lab 09/17/19 0645 09/23/19 2228  NA 137 134*  K 4.3 4.5  CL 100 104  CO2 24 19*  GLUCOSE 118* 120*  BUN 9 26*  CREATININE 1.05 1.31*  CALCIUM 9.1 8.9   GFR: Estimated Creatinine Clearance: 56.2 mL/min (A) (by C-G formula based on SCr of 1.31 mg/dL (H)). Liver Function Tests: Recent Labs  Lab 09/17/19 0645 09/23/19 2228  AST 41 58*  ALT 30 92*  ALKPHOS 64 80  BILITOT 1.0 0.8  PROT 6.9 6.9  ALBUMIN 3.6 3.3*   Recent Labs  Lab 09/17/19 0730  LIPASE 29   No results for input(s): AMMONIA in the last 168 hours. Coagulation Profile: Recent Labs  Lab  09/17/19 0730 09/20/19 0824 09/24/19 0238  INR 1.6* 1.7* 3.2*   Cardiac Enzymes: No results for input(s): CKTOTAL, CKMB, CKMBINDEX, TROPONINI in the last 168 hours. BNP (last 3 results) No results for input(s): PROBNP in the last 8760 hours. HbA1C: No results for input(s): HGBA1C in the last 72 hours. CBG: No results for input(s): GLUCAP in the last 168 hours. Lipid Profile: Recent Labs    09/24/19 0134  TRIG 160*   Thyroid Function Tests: No results for input(s): TSH, T4TOTAL, FREET4, T3FREE, THYROIDAB in the last 72 hours. Anemia Panel: Recent Labs    09/24/19 0134  FERRITIN 237   Urine analysis:    Component Value Date/Time   COLORURINE YELLOW 10/01/2018 2222   APPEARANCEUR CLEAR 10/01/2018 2222   LABSPEC 1.029 10/01/2018 2222   PHURINE 6.0 10/01/2018 2222   GLUCOSEU NEGATIVE 10/01/2018 2222   HGBUR NEGATIVE 10/01/2018 2222   BILIRUBINUR NEGATIVE 10/01/2018 2222   KETONESUR NEGATIVE 10/01/2018 2222   PROTEINUR NEGATIVE 10/01/2018 2222   NITRITE NEGATIVE 10/01/2018 2222   LEUKOCYTESUR NEGATIVE 10/01/2018 2222    Radiological Exams on Admission: DG Chest 2 View  Result Date: 09/23/2019 CLINICAL DATA:  63 year old male with chest pain. EXAM: CHEST - 2 VIEW COMPARISON:  Chest radiograph dated 09/13/2019. FINDINGS: Minimal left lung base hazy densities, likely atelectasis. No focal consolidation, pleural effusion, or pneumothorax. Stable cardiac silhouette. Left pectoral AICD device. No acute osseous pathology. IMPRESSION: No active cardiopulmonary disease. Electronically Signed   By: Anner Crete M.D.   On: 09/23/2019 22:49    EKG: Independently reviewed.  Assessment/Plan Principal Problem:   Acute respiratory failure with hypoxia (HCC) Active Problems:   Essential hypertension   Chronic systolic heart failure (HCC)   Cardiac amyloidosis (HCC)   Type 2 diabetes mellitus without complication, without long-term current use of insulin (HCC)   DVT (deep  venous thrombosis) ([place])    1. Acute resp failure with hypoxia - 1. Suspect this patient will either have COVID, or PE from the DVT last month, or possibly even both. 1. Bacterial infection seems less likely with procalcitonin < 0.10 and nl WBC. 2. 2 neg trops and a normal BNP make acute CHF seem less likely 2. COVID PCR pending, POC test was negative. 3. RVP pending 4. WBC nl and  procalcitonin neg 5. STAT CTA chest PE pending 6. Further treatment depending on above results 7. Tele monitor 8. Cont pulse ox 2. DVT - 1. Continue coumadin per pharm, INR 3.2 today 3. HTN - holding home BP meds for the moment, BPs running low 100s 4. Chronic systolic CHF - 1. Holding BP meds as above 2. Also holding aldactone until we have a more clear picture of what is going on. 5. DM2 - 1. Hold metformin 2. Mod scale SSI AC/HS  DVT prophylaxis: Coumadin Code Status: Full Family Communication: No family in room Disposition Plan: Home after admit Consults called: None Admission status: Admit to inpatient  Severity of Illness: The appropriate patient status for this patient is INPATIENT. Inpatient status is judged to be reasonable and necessary in order to provide the required intensity of service to ensure the patient's safety. The patient's presenting symptoms, physical exam findings, and initial radiographic and laboratory data in the context of their chronic comorbidities is felt to place them at high risk for further clinical deterioration. Furthermore, it is not anticipated that the patient will be medically stable for discharge from the hospital within 2 midnights of admission. The following factors support the patient status of inpatient.   IP status due to new O2 requirement.  Suspect he either will end up having COVID and/or PE from the DVT he had last month.  * I certify that at the point of admission it is my clinical judgment that the patient will require inpatient hospital care spanning  beyond 2 midnights from the point of admission due to high intensity of service, high risk for further deterioration and high frequency of surveillance required.*    Ophia Shamoon M. DO [facility]  How to contact the [facility] Attending or Consulting provider [place] or covering provider during after hours [place], for this patient?  1. Check the care team in [facility] and look for a) attending/consulting [facility] provider listed and b) the [facility] team listed 2. Log into [email or web address]  Amion Physician Scheduling and messaging for groups and whole hospitals  On call and physician scheduling software for group practices, residents, hospitalists and other medical providers for call, clinic, rotation and shift schedules. On[employer] is a hospital-wide system for scheduling doctors and paging doctors on call. EasyPlot is for scientific plotting and data analysis.  [email or web address]  and use [place]'s universal password to access. If you do not have the password, please contact the hospital operator.  3. Locate the [facility] provider you are looking for under [facility] and page to a number that you can be directly reached. 4. If you still have difficulty reaching the provider, please page the [facility] (Director on Call) for the Hospitalists listed on amion for assistance.  09/24/2019, 3:46 AM

## 2019-09-24 NOTE — Progress Notes (Signed)
Same day note  Patient seen and examined at bedside.  Patient was admitted to the hospital for shortness of breath, chest discomfort.    At the time of my evaluation, patient complains of dyspnea.   Physical examination reveals coarse breath sounds, tachypnea.   Laboratory data and imaging was reviewed over the last 24 hours.  Assessment and Plan.  Acute resp failure with hypoxia from COVID pneumonia. History of DVT. Procalcitonin < 0.10 and normal white cells. Negative troponins.  CT angiogram was negative for pulmonary embolism but was positive for peripheral airspace opacification. Check inflammatory biomarker.  Procalcitonin less than 0.10  COVID-19 Labs  Recent Labs    09/24/19 0134  DDIMER 0.61*  FERRITIN 237  LDH 264*  CRP 7.1*  Fibrinogen 6 92  Lab Results  Component Value Date   SARSCOV2NAA POSITIVE (A) 09/24/2019    History of DVT/pulmonary embolism - Continue coumadin, pharmacy to dose.  Continue supplemental oxygen.  INR is supratherapeutic.  Essential HTN - BP better now. Will resume home meds.  Hold Entresto for now.  Chronic systolic CHF, amyloid cardiomyopathy.  Resume hydralazine, aldactone. Hold entresto. Continue tafamidis.  No signs of volume overload at this time.  Diabetes mellitus type 2 Continue sliding scale insulin, Accu-Cheks, diabetic diet.  Closely monitor.  Hold Metformin.   No Charge  Signed,  Delila Pereyra, MD [facility]

## 2019-09-24 NOTE — Progress Notes (Signed)
ANTICOAGULATION CONSULT NOTE - Initial Consult  Pharmacy Consult for coumadin Indication: VTE  Allergies  Allergen Reactions  . Peanut-Containing Drug Products Swelling  . Penicillins Other (See Comments)    CONVULSIONS Has patient had a PCN reaction causing immediate rash, facial/tongue/throat swelling, SOB or lightheadedness with hypotension: No Has patient had a PCN reaction causing severe rash involving mucus membranes or skin necrosis: No Has patient had a PCN reaction that required hospitalization No Has patient had a PCN reaction occurring within the last 10 years: No If all of the above answers are "NO", then may proceed with Cephalosporin use.   . Decadron [Dexamethasone] Itching    Patient Measurements: Height: 5\' 6"  (167.6 cm) Weight: 174 lb (78.9 kg) IBW/kg (Calculated) : 63.8   Vital Signs: Temp: 99.3 F (37.4 C) (12/25 0209) Temp Source: Rectal (12/25 0209) BP: 116/70 (12/25 0345) Pulse Rate: 86 (12/25 0345)  Labs: Recent Labs    09/23/19 2228 09/24/19 0112 09/24/19 0238  HGB 13.5  --   --   HCT 42.6  --   --   PLT 250  --   --   LABPROT  --   --  32.3*  INR  --   --  3.2*  CREATININE 1.31*  --   --   TROPONINIHS 13 12  --     Estimated Creatinine Clearance: 56.2 mL/min (A) (by C-G formula based on SCr of 1.31 mg/dL (H)).   Medical History: Past Medical History:  Diagnosis Date  . Abnormal TSH 02/2016  . Anxiety   . Cervical disc disease   . Chronic combined systolic and diastolic CHF (congestive heart failure) ([place])    a. 01/2016 Echo: EF 25%, inf AK, diffuse sev HK, Gr1 DD, mild MR.  . Depression   . Hyperglycemia   . Hypertension   . Hypertensive heart disease   . Myocardial infarction [facility])    ??  maybe  . NICM (nonischemic cardiomyopathy) ([place])    a. 01/2016 Echo: EF 25%, inf AK, diffuse sev HK, Gr1 DD, mild MR; b. 01/2016 MV: EF 22%, no isch/infarct;  c. 02/2016 Cath: Nl cors, EF 35-45%.  . Pre-diabetes   . Seizures ([place])    with  penicillin    Medications:  Coumadin 7.5 mg daily, last dose 12/24  Assessment: 63 yo man to continue coumadin from home.  INR 3.2 Goal of Therapy:  INR 2-3 Monitor platelets by anticoagulation protocol: Yes   Plan:  Check PT/INR daily Monitor for bleeding complications  Meeah Totino [place]et 09/24/2019,3:55 AM

## 2019-09-24 NOTE — ED Notes (Signed)
Pt given update on plan.

## 2019-09-24 NOTE — ED Notes (Signed)
MD in room

## 2019-09-24 NOTE — ED Notes (Signed)
Pt restful on his cell without distress noted.

## 2019-09-25 LAB — CBC WITH DIFFERENTIAL/PLATELET
Abs Immature Granulocytes: 0.03 10*3/uL (ref 0.00–0.07)
Basophils Absolute: 0 10*3/uL (ref 0.0–0.1)
Basophils Relative: 1 %
Eosinophils Absolute: 0.1 10*3/uL (ref 0.0–0.5)
Eosinophils Relative: 3 %
HCT: 41.6 % (ref 39.0–52.0)
Hemoglobin: 13.3 g/dL (ref 13.0–17.0)
Immature Granulocytes: 1 %
Lymphocytes Relative: 29 %
Lymphs Abs: 1.5 10*3/uL (ref 0.7–4.0)
MCH: 27.4 pg (ref 26.0–34.0)
MCHC: 32 g/dL (ref 30.0–36.0)
MCV: 85.6 fL (ref 80.0–100.0)
Monocytes Absolute: 0.4 10*3/uL (ref 0.1–1.0)
Monocytes Relative: 8 %
Neutro Abs: 3 10*3/uL (ref 1.7–7.7)
Neutrophils Relative %: 58 %
Platelets: 242 10*3/uL (ref 150–400)
RBC: 4.86 MIL/uL (ref 4.22–5.81)
RDW: 14.1 % (ref 11.5–15.5)
WBC: 5.1 10*3/uL (ref 4.0–10.5)
nRBC: 0 % (ref 0.0–0.2)

## 2019-09-25 LAB — COMPREHENSIVE METABOLIC PANEL
ALT: 71 U/L — ABNORMAL HIGH (ref 0–44)
AST: 45 U/L — ABNORMAL HIGH (ref 15–41)
Albumin: 3.1 g/dL — ABNORMAL LOW (ref 3.5–5.0)
Alkaline Phosphatase: 75 U/L (ref 38–126)
Anion gap: 12 (ref 5–15)
BUN: 21 mg/dL (ref 8–23)
CO2: 18 mmol/L — ABNORMAL LOW (ref 22–32)
Calcium: 8.4 mg/dL — ABNORMAL LOW (ref 8.9–10.3)
Chloride: 104 mmol/L (ref 98–111)
Creatinine, Ser: 0.93 mg/dL (ref 0.61–1.24)
GFR calc Af Amer: >60 mL/min (ref >60)
GFR calc non Af Amer: >60 mL/min (ref >60)
Glucose, Bld: 113 mg/dL — ABNORMAL HIGH (ref 70–99)
Potassium: 4.2 mmol/L (ref 3.5–5.1)
Sodium: 134 mmol/L — ABNORMAL LOW (ref 135–145)
Total Bilirubin: 0.3 mg/dL (ref 0.3–1.2)
Total Protein: 6.4 g/dL — ABNORMAL LOW (ref 6.5–8.1)

## 2019-09-25 LAB — CBG MONITORING, ED
Glucose-Capillary: 80 mg/dL (ref 70–99)
Glucose-Capillary: 147 mg/dL — ABNORMAL HIGH (ref 70–99)

## 2019-09-25 LAB — D-DIMER, QUANTITATIVE: D-Dimer, Quant: 0.56 ug/mL-FEU — ABNORMAL HIGH (ref 0.00–0.50)

## 2019-09-25 LAB — PROTIME-INR
INR: 3.8 — ABNORMAL HIGH (ref 0.8–1.2)
Prothrombin Time: 37.4 seconds — ABNORMAL HIGH (ref 11.4–15.2)

## 2019-09-25 LAB — C-REACTIVE PROTEIN: CRP: 5.4 mg/dL — ABNORMAL HIGH (ref <1.0)

## 2019-09-25 LAB — FERRITIN: Ferritin: 195 ng/mL (ref 24–336)

## 2019-09-25 LAB — MAGNESIUM: Magnesium: 2.1 mg/dL (ref 1.7–2.4)

## 2019-09-25 NOTE — ED Notes (Signed)
Checked patient cbg it was 4 notified RN of blood sugar

## 2019-09-25 NOTE — ED Notes (Signed)
Pt requesting to speak with the dr. Quintella Baton he can sit around at home and quarantine.

## 2019-09-25 NOTE — ED Notes (Signed)
SDU  Breakfast ordered  

## 2019-09-25 NOTE — ED Notes (Signed)
Pt stated that at 1345 he was leaving if he hadnt heard from the Dr.. RN messaged the Dr. Again to make him aware.

## 2019-09-25 NOTE — Discharge Summary (Signed)
Physician Discharge Summary  Jonathan Dickson [id number] DOB: [birth year] DOA: 09/23/2019  PCP: Ladell Pier, MD  Admit date: 09/23/2019 Discharge date: 09/25/2019  Admitted From: Home  Discharge disposition: AMA   Recommendations for Outpatient Follow-Up:   Advised to come to the  hospital/ refer to primary provider for worsening symptoms.  Discharge Diagnosis:   Principal Problem:   Acute respiratory failure with hypoxia (HCC) Active Problems:   Essential hypertension   Chronic systolic heart failure (HCC)   Cardiac amyloidosis (HCC)   Type 2 diabetes mellitus without complication, without long-term current use of insulin (HCC)   DVT (deep venous thrombosis) ([place])   Discharge Condition: left AMA  Diet recommendation:  Carbohydrate-modified advised.  Wound care: None.  Code status: Full.   History of Present Illness:   Jonathan Dickson is a 63 y.o. 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 Patient presented to the ED with c/o gradually onset, persistent, and progressively worsening SOB for 6 days ago. Patient reported cough, fever, SOB, CP, N/V.  ED Course:  INR was supratheraputic at 3.2. D.Dimer 0.61.  Procalcitonin and lactate neg. CXR neg, WBC nl. Trop nl x2, BNP 40. Desaturated to 26 with ambulation in ED.   Hospital Course:  Following conditions were addressed during hospitalization,  Acute resp failure with hypoxia -likely from COVID-19  Pneumonia. Procalcitonin < 0.10 with normal WBC. Neg trops and a normal BNP.  CT angiogram was positive for peripheral opacities.  Patient was started on remdesivir.  Steroid was not listed in the allergy so was not given.  Patient did not wish to stay in the hospital for further treatment though the risk of deteriorating  condition was advised to the patient.  History of DVT -CT angiogram of the chest did not show any evidence of pulmonary embolism this time.  He was advised to continue Coumadin at home.    Essential hypertension.  Remained stable.  Chronic systolic CHF .  Not thought to be in exacerbation at the time of admission.  DM2 -diabetic diet was advised.  Disposition.  I had a prolonged discussion with the patient regarding the diagnosis of Covid and the potential deterioration in his clinical condition including life-threatening situations.  Patient does have history of congestive heart failure, hypertension putting him at high risk.  Despite of explaining him the risk of COVID-19 illness and its potential complications, patient insisted on leaving [place] and did not wish to wait for any medications or Paper work.  He was however advised to seek medical attention immediately if he changed his mind or symptoms worsened.  Medical Consultants:    None.   Subjective:   Today, patient feels upset that he can have the same level of care at home versus here.  Wishes to leave [place]. History of AMA   Discharge Exam:   Vitals:   09/25/19 1310 09/25/19 1315  BP:  128/83  Pulse: (!) 108 (!) 107  Resp: (!) 22 20  Temp:    SpO2: 96% 96%   Vitals:   09/25/19 1307 09/25/19 1308 09/25/19 1310 09/25/19 1315  BP:    128/83  Pulse:   (!) 108 (!) 107  Resp:   (!) 22 20  Temp:      TempSrc:  SpO2: 95% 96% 96% 96%  Weight:      Height:        General exam: appears upset, on room air saturating 96 percent ,Not in distress HEENT:PERRL,Oral mucosa moist Respiratory system: Bilateral equal air entry, normal vesicular breath sounds, no wheezes or crackles  Cardiovascular system: S1 & S2 heard, RRR.  Gastrointestinal system: Abdomen is nondistended, soft and nontender. No organomegaly or masses felt. Normal bowel sounds heard. Central nervous system: Alert and  oriented. No focal neurological deficits. Extremities: No edema, no clubbing ,no cyanosis, distal peripheral pulses palpable. Skin: No rashes, lesions or ulcers,no icterus ,no pallor MSK: Normal muscle bulk,tone ,power    Procedures:    None  The results of significant diagnostics from this hospitalization (including imaging, microbiology, ancillary and laboratory) are listed below for reference.     Diagnostic Studies:   DG Chest 2 View  Result Date: 09/23/2019 CLINICAL DATA:  63 year old male with chest pain. EXAM: CHEST - 2 VIEW COMPARISON:  Chest radiograph dated 09/13/2019. FINDINGS: Minimal left lung base hazy densities, likely atelectasis. No focal consolidation, pleural effusion, or pneumothorax. Stable cardiac silhouette. Left pectoral AICD device. No acute osseous pathology. IMPRESSION: No active cardiopulmonary disease. Electronically Signed   By: Anner Crete M.D.   On: 09/23/2019 22:49   CT Angio Chest PE W and/or Wo Contrast  Result Date: 09/24/2019 CLINICAL DATA:  Worsening shortness of breath several days requiring oxygen. COVID-19 positive. Suspect pulmonary embolism. EXAM: CT ANGIOGRAPHY CHEST WITH CONTRAST TECHNIQUE: Multidetector CT imaging of the chest was performed using the standard protocol during bolus administration of intravenous contrast. Multiplanar CT image reconstructions and MIPs were obtained to evaluate the vascular anatomy. CONTRAST:  39mL OMNIPAQUE IOHEXOL 350 MG/ML SOLN COMPARISON:  05/12/2018 FINDINGS: Cardiovascular: Moderate cardiomegaly. Thoracic aorta is unremarkable. Pulmonary arterial system is well opacified without evidence of emboli. Remaining vascular structures are unremarkable. Mediastinum/Nodes: No mediastinal or hilar adenopathy. Small to moderate size hiatal hernia. Remaining mediastinal structures are normal. Lungs/Pleura: Lungs are adequately inflated. There is subtle paraseptal emphysematous disease over the apices. There is minimal  patchy peripheral opacification over the right mid to lower lung which may be due to atelectasis or infection. Minimal opacification over the posterior left lung likely atelectasis. No evidence of effusion. Airways are normal. Upper Abdomen: No acute findings. Musculoskeletal: Mild degenerative change of the spine. Partially visualized anterior fusion hardware over the cervical spine. Review of the MIP images confirms the above findings. IMPRESSION: 1.  No evidence of pulmonary embolism. 2. Subtle hazy peripheral airspace opacification over the right mid to lower lung likely due to infection less likely atelectasis. Mild opacification left base likely atelectasis. 3.  Hiatal hernia. Electronically Signed   By: Marin Olp M.D.   On: 09/24/2019 09:02     Labs:   Basic Metabolic Panel: Recent Labs  Lab 09/23/19 2228 09/25/19 0331  NA 134* 134*  K 4.5 4.2  CL 104 104  CO2 19* 18*  GLUCOSE 120* 113*  BUN 26* 21  CREATININE 1.31* 0.93  CALCIUM 8.9 8.4*  MG  --  2.1   GFR Estimated Creatinine Clearance: 79.2 mL/min (by C-G formula based on SCr of 0.93 mg/dL). Liver Function Tests: Recent Labs  Lab 09/23/19 2228 09/25/19 0331  AST 58* 45*  ALT 92* 71*  ALKPHOS 80 75  BILITOT 0.8 0.3  PROT 6.9 6.4*  ALBUMIN 3.3* 3.1*   No results for input(s): LIPASE, AMYLASE in the last 168 hours. No results for input(s): AMMONIA  in the last 168 hours. Coagulation profile Recent Labs  Lab 09/20/19 0824 09/24/19 0238 09/24/19 1257 09/25/19 0331  INR 1.7* 3.2* 3.2* 3.8*    CBC: Recent Labs  Lab 09/23/19 2228 09/25/19 0331  WBC 6.1 5.1  NEUTROABS  --  3.0  HGB 13.5 13.3  HCT 42.6 41.6  MCV 86.6 85.6  PLT 250 242   Cardiac Enzymes: No results for input(s): CKTOTAL, CKMB, CKMBINDEX, TROPONINI in the last 168 hours. BNP: Invalid input(s): POCBNP CBG: Recent Labs  Lab 09/24/19 1539 09/24/19 1813 09/24/19 2159 09/25/19 0824 09/25/19 1150  GLUCAP 99 97 121* 80 147*    D-Dimer Recent Labs    09/24/19 0134 09/25/19 0331  DDIMER 0.61* 0.56*   Hgb A1c Recent Labs    09/24/19 0445  HGBA1C 7.1*   Lipid Profile Recent Labs    09/24/19 0134  TRIG 160*   Thyroid function studies No results for input(s): TSH, T4TOTAL, T3FREE, THYROIDAB in the last 72 hours.  Invalid input(s): FREET3 Anemia work up Recent Labs    09/24/19 0134 09/25/19 0331  FERRITIN 237 195   Microbiology Recent Results (from the past 240 hour(s))  Blood Culture (routine x 2)     Status: None (Preliminary result)   Collection Time: 09/24/19  1:39 AM   Specimen: BLOOD LEFT HAND  Result Value Ref Range Status   Specimen Description BLOOD LEFT HAND  Final   Special Requests   Final    BOTTLES DRAWN AEROBIC AND ANAEROBIC Blood Culture results may not be optimal due to an inadequate volume of blood received in culture bottles   Culture   Final    NO GROWTH 1 DAY Performed at [facility], [place] [address], [place], [place] [address]    Report Status PENDING  Incomplete  SARS CORONAVIRUS 2 (TAT 6-24 HRS) Nasopharyngeal Nasopharyngeal Swab     Status: Abnormal   Collection Time: 09/24/19  1:54 AM   Specimen: Nasopharyngeal Swab  Result Value Ref Range Status   SARS Coronavirus 2 POSITIVE (A) NEGATIVE Final    Comment: RESULT CALLED TO, READ BACK BY AND VERIFIED WITH: B.OSORIO,RN 0703 09/24/19 G.[email or web address] Performed at [facility], [place] [address] [place], [place] [address]   Blood Culture (routine x 2)     Status: None (Preliminary result)   Collection Time: 09/24/19  1:54 AM   Specimen: BLOOD  Result Value Ref Range Status   Specimen Description BLOOD RIGHT ANTECUBITAL  Final   Special Requests   Final    BOTTLES DRAWN AEROBIC AND ANAEROBIC Blood Culture results may not be optimal due to an inadequate volume of blood received in culture bottles   Culture   Final    NO GROWTH 1 DAY Performed at [facility], [place] [address], [place], [place] [address]     Report Status PENDING  Incomplete     Discharge Instructions:    Allergies as of 09/25/2019      Reactions   Peanut-containing Drug Products Swelling   Penicillins Other (See Comments)   CONVULSIONS Has patient had a PCN reaction causing immediate rash, facial/tongue/throat swelling, SOB or lightheadedness with hypotension: No Has patient had a PCN reaction causing severe rash involving mucus membranes or skin necrosis: No Has patient had a PCN reaction that required hospitalization No Has patient had a PCN reaction occurring within the last 10 years: No If all of the above answers are "NO", then may proceed with Cephalosporin use.   Decadron [  dexamethasone] Itching  none        Signed:  Gannon Heinzman  [facility] 09/25/2019, 1:41 PM

## 2019-09-28 ENCOUNTER — Ambulatory Visit (INDEPENDENT_AMBULATORY_CARE_PROVIDER_SITE_OTHER): Payer: Medicaid Other | Admitting: *Deleted

## 2019-09-28 DIAGNOSIS — I428 Other cardiomyopathies: Secondary | ICD-10-CM

## 2019-09-28 LAB — CUP PACEART REMOTE DEVICE CHECK
Battery Remaining Longevity: 84 mo
Battery Remaining Percentage: 84 %
Battery Voltage: 3.01 V
Brady Statistic RV Percent Paced: 1 %
Date Time Interrogation Session: 20201229021553
HighPow Impedance: 78 Ohm
HighPow Impedance: 78 Ohm
Implantable Lead Implant Date: 20190918
Implantable Lead Location: 753860
Implantable Lead Model: 7122
Implantable Pulse Generator Implant Date: 20190918
Lead Channel Impedance Value: 440 Ohm
Lead Channel Pacing Threshold Amplitude: 0.5 V
Lead Channel Pacing Threshold Pulse Width: 0.5 ms
Lead Channel Sensing Intrinsic Amplitude: 12 mV
Lead Channel Setting Pacing Amplitude: 2.5 V
Lead Channel Setting Pacing Pulse Width: 0.5 ms
Lead Channel Setting Sensing Sensitivity: 0.5 mV
Pulse Gen Serial Number: 9796641

## 2019-09-29 LAB — CULTURE, BLOOD (ROUTINE X 2)
Culture: NO GROWTH
Culture: NO GROWTH

## 2019-10-07 ENCOUNTER — Ambulatory Visit: Payer: Self-pay | Admitting: Surgery

## 2019-10-07 NOTE — H&P (Signed)
Jonathan Dickson DOB: 11-Apr-1956 Widowed / Language: Cleophus Molt / Race: Black or African American Male  History of Present Illness Patient words: Returns to discuss repair of his hernia which is now incarcerated. He has been cleared by cardiology. Denies any problems with his heart or major health problems in the last year since I met him. However, on review of his Epic chart, he was admitted in late December with COVID, left AMA.  Initial visit 10/16/18: This is a very nice 64 year old gentleman who is referred by the emergency department for an incarcerated epigastric hernia. He noticed it at the beginning of January as a painful lump in the midline several centimeters above his umbilicus. He does not think it has decreased or increased in size. He has been wearing an abdominal binder which minimally helps with the pain he experiences. He recently developed an upper infection and has had significant coughing which causes the hernia to hurt significantly. He denies any prior abdominal surgery and did not notice any lumps or pain in his abdomen prior to a few weeks ago.  He has a very significant medical history including nonischemic cardiomyopathy with heart failure, most recent ejection fraction 25-30% in August 2019. He is on Lasix, Coreg, spironolactone, anticoagulation. History of 3 previous myocardial infarctions, last about a year ago Large bilateral pulmonary emboli in August 2019 which caused him to have acute hypoxic respiratory failure, decompensated heart failure, and was a significant life-threatening event.  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 Haroldine Laws, and his electrophysiologist is Dr Curt Bears.  This is causing him pain, he is interested in having it repaired. He is a very high risk for complications based on his medical history is listed in the HPI. We discussed open repair, likely primary given small size of the hernia. Discussed risks of bleeding, infection, pain, scarring, injury to intra-abdominal structures, hernia recurrence, major cardiac event or recurrent DVT/PE, arrhythmia, pneumonia, stroke, and generally increased risk of perioperative morbidity or mortality. We also discussed the option of nonoperative management with a very very small risk of bowel incarceration and sinus symptoms to be aware of. Discussed that he can continue using abdominal binder to reduce pain that we could see how he fares once his upper respiratory infection is resolved and he is no longer coughing, he may find that he is more comfortable. At this juncture he would like to have the hernia fixed understanding the increased risks of doing so. He'll need cardiac clearance. We'll not schedule until the end of February at the earliest so that he will have been able to have a full 6 months of anticoagulation following his recent pulmonary emboli.    Allergies  Penicillins Swelling. Peanuts Decadron *CORTICOSTEROIDS* Itching. Allergies Reconciled  Medication History Eliquis (5MG Tablet, Oral) Active. Tylenol (500MG Capsule, Oral) Active. Amitriptyline HCl (25MG Tablet, Oral) Active. Coreg (12.5MG Tablet, Oral) Active. Tessalon Perles (100MG Capsule, Oral) Active. Vitamin D (1000UNIT Tablet, Oral) Active. Lasix (40MG Tablet, Oral) Active. metFORMIN HCl ER (500MG Tablet ER 24HR, Oral) Active. Entresto (97-103MG Tablet, Oral) Active. Spironolactone (25MG Tablet, Oral) Active. hydrALAZINE HCl (  25MG Tablet, Oral) Active. Medications Reconciled    Vitals Weight: 173  lb Height: 66in Body Surface Area: 1.88 m Body Mass Index: 27.92 kg/m  Temp.: 97.17F  Pulse: 93 (Regular)  BP: 126/86 (Sitting, Left Arm, Standard)   Physical Exam  The physical exam findings are as follows: Note:He is alert and cooperative. Unlabored respirations. Abdomen is soft and nontender, there is a chronically incarcerated supraumbilical hernia, fascial defect is not palpable at this time.    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 He is a very high risk for complications based on his medical history is listed in the HPI. We discussed open repair, likely primary given small size of the hernia. Discussed risks of bleeding, infection, pain, scarring, injury to intra-abdominal structures, hernia recurrence, major cardiac event or recurrent DVT/PE, arrhythmia, pneumonia, stroke, and generally increased risk of perioperative morbidity or mortality. We also discussed the option of nonoperative management with a very very small risk of bowel incarceration and sinus symptoms to be aware of. At this juncture he would like to have the hernia fixed understanding the increased risks of doing so. He has received cardiac clearance. He will need to do a Lovenox bridge therapy with Coumadin clinic leading up to surgery.

## 2019-10-07 NOTE — H&P (View-Only) (Signed)
Jonathan Dickson DOB: [birth year] Widowed / Language: Cleophus Molt / Race: Black or African American Male  History of Present Illness Patient words: Returns to discuss repair of his hernia which is now incarcerated. He has been cleared by cardiology. Denies any problems with his heart or major health problems in the last year since I met him. However, on review of his Epic chart, he was admitted in late December with COVID, left AMA.  Initial visit 10/16/18: This is a very nice 64 year old gentleman who is referred by the emergency department for an incarcerated epigastric hernia. He noticed it at the beginning of January as a painful lump in the midline several centimeters above his umbilicus. He does not think it has decreased or increased in size. He has been wearing an abdominal binder which minimally helps with the pain he experiences. He recently developed an upper infection and has had significant coughing which causes the hernia to hurt significantly. He denies any prior abdominal surgery and did not notice any lumps or pain in his abdomen prior to a few weeks ago.  He has a very significant medical history including nonischemic cardiomyopathy with heart failure, most recent ejection fraction 25-30% in August 2019. He is on Lasix, Coreg, spironolactone, anticoagulation. History of 3 previous myocardial infarctions, last about a year ago Large bilateral pulmonary emboli in August 2019 which caused him to have acute hypoxic respiratory failure, decompensated heart failure, and was a significant life-threatening event.  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 Haroldine Laws, and his electrophysiologist is Dr Curt Bears.  This is causing him pain, he is interested in having it repaired. He is a very high risk for complications based on his medical history is listed in the HPI. We discussed open repair, likely primary given small size of the hernia. Discussed risks of bleeding, infection, pain, scarring, injury to intra-abdominal structures, hernia recurrence, major cardiac event or recurrent DVT/PE, arrhythmia, pneumonia, stroke, and generally increased risk of perioperative morbidity or mortality. We also discussed the option of nonoperative management with a very very small risk of bowel incarceration and sinus symptoms to be aware of. Discussed that he can continue using abdominal binder to reduce pain that we could see how he fares once his upper respiratory infection is resolved and he is no longer coughing, he may find that he is more comfortable. At this juncture he would like to have the hernia fixed understanding the increased risks of doing so. He'll need cardiac clearance. We'll not schedule until the end of February at the earliest so that he will have been able to have a full 6 months of anticoagulation following his recent pulmonary emboli.    Allergies  Penicillins Swelling. Peanuts Decadron *CORTICOSTEROIDS* Itching. Allergies Reconciled  Medication History Eliquis (5MG Tablet, Oral) Active. Tylenol (500MG Capsule, Oral) Active. Amitriptyline HCl (25MG Tablet, Oral) Active. Coreg (12.5MG Tablet, Oral) Active. Tessalon Perles (100MG Capsule, Oral) Active. Vitamin D (1000UNIT Tablet, Oral) Active. Lasix (40MG Tablet, Oral) Active. metFORMIN HCl ER (500MG Tablet ER 24HR, Oral) Active. Entresto (97-103MG Tablet, Oral) Active. Spironolactone (25MG Tablet, Oral) Active. hydrALAZINE HCl (  25MG Tablet, Oral) Active. Medications Reconciled    Vitals Weight: 173  lb Height: 66in Body Surface Area: 1.88 m Body Mass Index: 27.92 kg/m  Temp.: 97.75F  Pulse: 93 (Regular)  BP: 126/86 (Sitting, Left Arm, Standard)   Physical Exam  The physical exam findings are as follows: Note:He is alert and cooperative. Unlabored respirations. Abdomen is soft and nontender, there is a chronically incarcerated supraumbilical hernia, fascial defect is not palpable at this time.    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 He is a very high risk for complications based on his medical history is listed in the HPI. We discussed open repair, likely primary given small size of the hernia. Discussed risks of bleeding, infection, pain, scarring, injury to intra-abdominal structures, hernia recurrence, major cardiac event or recurrent DVT/PE, arrhythmia, pneumonia, stroke, and generally increased risk of perioperative morbidity or mortality. We also discussed the option of nonoperative management with a very very small risk of bowel incarceration and sinus symptoms to be aware of. At this juncture he would like to have the hernia fixed understanding the increased risks of doing so. He has received cardiac clearance. He will need to do a Lovenox bridge therapy with Coumadin clinic leading up to surgery.

## 2019-10-11 ENCOUNTER — Ambulatory Visit (HOSPITAL_COMMUNITY)
Admission: RE | Admit: 2019-10-11 | Discharge: 2019-10-11 | Disposition: A | Discharge status: Home / Self Care | Payer: Medicaid Other | Source: Ambulatory Visit | Attending: Internal Medicine | Admitting: Internal Medicine

## 2019-10-11 ENCOUNTER — Other Ambulatory Visit: Payer: Self-pay

## 2019-10-11 ENCOUNTER — Encounter (HOSPITAL_COMMUNITY): Payer: Self-pay | Admitting: Internal Medicine

## 2019-10-11 VITALS — BP 100/70 | HR 101 | Wt 170.6 lb

## 2019-10-11 DIAGNOSIS — Z7901 Long term (current) use of anticoagulants: Secondary | ICD-10-CM | POA: Insufficient documentation

## 2019-10-11 DIAGNOSIS — Z7982 Long term (current) use of aspirin: Secondary | ICD-10-CM | POA: Diagnosis not present

## 2019-10-11 DIAGNOSIS — I252 Old myocardial infarction: Secondary | ICD-10-CM | POA: Insufficient documentation

## 2019-10-11 DIAGNOSIS — Z79899 Other long term (current) drug therapy: Secondary | ICD-10-CM | POA: Insufficient documentation

## 2019-10-11 DIAGNOSIS — I110 Hypertensive heart disease with heart failure: Secondary | ICD-10-CM | POA: Insufficient documentation

## 2019-10-11 DIAGNOSIS — E854 Organ-limited amyloidosis: Secondary | ICD-10-CM

## 2019-10-11 DIAGNOSIS — I43 Cardiomyopathy in diseases classified elsewhere: Secondary | ICD-10-CM

## 2019-10-11 DIAGNOSIS — Z87891 Personal history of nicotine dependence: Secondary | ICD-10-CM | POA: Diagnosis not present

## 2019-10-11 DIAGNOSIS — I5022 Chronic systolic (congestive) heart failure: Secondary | ICD-10-CM

## 2019-10-11 DIAGNOSIS — I10 Essential (primary) hypertension: Secondary | ICD-10-CM

## 2019-10-11 DIAGNOSIS — Z7984 Long term (current) use of oral hypoglycemic drugs: Secondary | ICD-10-CM | POA: Diagnosis not present

## 2019-10-11 DIAGNOSIS — I2699 Other pulmonary embolism without acute cor pulmonale: Secondary | ICD-10-CM | POA: Diagnosis not present

## 2019-10-11 DIAGNOSIS — E119 Type 2 diabetes mellitus without complications: Secondary | ICD-10-CM | POA: Diagnosis not present

## 2019-10-11 DIAGNOSIS — I5042 Chronic combined systolic (congestive) and diastolic (congestive) heart failure: Secondary | ICD-10-CM | POA: Diagnosis present

## 2019-10-11 MED ORDER — ISOSORBIDE MONONITRATE ER 30 MG PO TB24: 30.0000 mg | ORAL_TABLET | Freq: Every day | ORAL | 3 refills | Status: DC

## 2019-10-11 NOTE — Patient Instructions (Signed)
START Imdur 30mg  (1 tab) every night before bed   You have been ordered a PYP Scan.  This is done in the Radiology Department of [facility]  When you come for this test please plan to be there 2-3 hours.  You will be called by the schedulers to arrange this appointment   Your physician recommends that you schedule a follow-up appointment in: 4 months with Dr Haroldine Laws   Please call office at [phone] option 2 if you have any questions or concerns.    At the [facility], you and your health needs are our priority. As part of our continuing mission to provide you with exceptional heart care, we have created designated Provider Care Teams. These Care Teams include your primary Cardiologist (physician) and Advanced Practice Providers (APPs- Physician Assistants and Nurse Practitioners) who all work together to provide you with the care you need, when you need it.   You may see any of the following providers on your designated Care Team at your next follow up: Marland Kitchen Dr [name] . Dr Loralie Champagne . Darrick Grinder, NP . Lyda Jester, PA . Audry Riles, PharmD   Please be sure to bring in all your medications bottles to every appointment.

## 2019-10-11 NOTE — Addendum Note (Signed)
Encounter addended by: Valeda Malm, RN on: 10/11/2019 11:25 AM  Actions taken: Pharmacy for encounter modified, Order list changed, Diagnosis association updated, Clinical Note Signed

## 2019-10-11 NOTE — Progress Notes (Signed)
[facility] Referring Physician: Dr [name]  Primary Care: Dr Wynetta Emery at [facility]  Primary Cardiologist: Dr [name] EP: Dr Curt Bears HF: Dr Haroldine Laws  HPI: Jonathan Dickson is a 64 y.o. male with a HTN, previous polysubstance abuse - including heroin (quit 2004) and depression, heart failure, TTR cardiac amyloidosis, and PE.    Had Myoview in 5/17 with EF 22%. Subsequently underwent cath 6/17. EF 40-45%. Normal coronaries   Admitted to [facility] 8/19 with increased dyspnea. Diuresed with IV lasix. CTA performed and showed bilateral PE. Had AKI so diuretics held.  Placed on eliquis for PE.    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  He returns today for regular follow up. Now on tafamdis. Says he had PNA over Christmas but now better. Now he is active and feeling better. No swelling, orthopnea or PND. No SOB with routine activities. Mild SOB with steps.    ICD: No VT/VF. Impedance ok. Activity level back up. Personally reviewed    Cardiac Testing CTA 05/12/2018 --.large bilateral  PE   CPX 04/03/18 FVC 2.77 (83%)    FEV1 2.37 (91%)     FEV1/FVC 86 (109%)     MVV 76 (61%)  Resting HR: 83 Peak HR: 125  (80% age predicted max HR) BP rest: 144/84 BP peak: 168/72 Peak VO2: 23.1 (76% predicted peak VO2) VE/VCO2 slope: 41 OUES: 2.18 Peak RER: 0.93 VE/MVV: 88% O2pulse: 14  (100% predicted O2pulse)  PYP scan obtained which was positive (Visual grade 2. Quantitative 1.57).  cMRI 04/01/18 1. Mild LVE with mild LVH septal thickness 13 mm. Diffuse hypokinesis worse in the septum apex and lateral walls EF 28% 2. 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 RV normal without evidence of acute strain. LE u/s  without DVT.  ECHO 12/2017   Left ventricle: EF 30% to 35% (I felt closer to 40-45%). Mild LVH Diffuse hypokinesis. Doppler   parameters are consistent with abnormal left ventricular   relaxation (grade 1 diastolic dysfunction).  ECHO EF 08/2017  Left ventricle: EF 50% to 55%.   Review of systems complete and found to be negative unless listed in HPI.   Past Medical History:  Diagnosis Date  . Abnormal TSH 02/2016  . Anxiety   . Cervical disc disease   . Chronic combined systolic and diastolic CHF (congestive heart failure) ([place])    a. 01/2016 Echo: EF 25%, inf AK, diffuse sev HK, Gr1 DD, mild MR.  . Depression   . Hyperglycemia   . Hypertension   . Hypertensive heart disease   . Myocardial infarction [facility])    ??  maybe  . NICM (nonischemic cardiomyopathy) ([place])    a. 01/2016 Echo: EF 25%, inf AK, diffuse sev HK, Gr1 DD, mild MR; b. 01/2016 MV: EF 22%, no isch/infarct;  c. 02/2016 Cath: Nl cors, EF 35-45%.  . Pre-diabetes   . Seizures ([place])    with penicillin    Current Outpatient Medications  Medication Sig Dispense Refill  . amitriptyline (ELAVIL) 25 MG tablet TAKE 1 TABLET(25 MG) BY MOUTH AT BEDTIME AS NEEDED FOR SLEEP (Patient taking differently: Take 25 mg by mouth at bedtime as needed for sleep. ) 30 tablet 2  . atorvastatin (LIPITOR) 20 MG tablet Take 1 tablet (20 mg total)  by mouth daily. 90 tablet 3  . carvedilol (COREG) 25 MG tablet Take 1 tablet (25 mg total) by mouth 2 (two) times daily with a meal. 180 tablet 3  . Chlorphen-Phenyleph-ASA (ALKA-SELTZER PLUS COLD PO) Take 1-2 tablets by mouth 2 (two) times daily as needed (for present symptoms).    . furosemide (LASIX) 40 MG tablet Take 1 tablet (40 mg total) by mouth daily as needed for fluid (if weight gain more than 3 punds). 30 tablet 0  . hydrALAZINE (APRESOLINE) 50 MG tablet TAKE 1 TABLET(50 MG) BY MOUTH THREE TIMES DAILY. FOLLOW UP APPOINTMENT PLEASE (Patient taking differently: Take 50 mg by mouth 3 (three) times  daily. ) 90 tablet 3  . metFORMIN (GLUCOPHAGE) 500 MG tablet Take 1 tablet (500 mg total) by mouth daily with breakfast. 90 tablet 3  . methocarbamol (ROBAXIN) 500 MG tablet Take 1 tablet (500 mg total) by mouth every 8 (eight) hours as needed for muscle spasms. 15 tablet 0  . pseudoephedrine (SUDAFED) 30 MG tablet Take 30 mg by mouth every 4 (four) hours as needed for congestion.    . sacubitril-valsartan (ENTRESTO) 97-103 MG Take 1 tablet by mouth 2 (two) times daily. 60 tablet 6  . sildenafil (VIAGRA) 50 MG tablet TAKE 1 TABLET BY MOUTH EVERY DAIY AS NEEDED FOR ERECTILE DYSFUNCTION (Patient taking differently: Take 50 mg by mouth daily as needed for erectile dysfunction. ) 10 tablet 2  . spironolactone (ALDACTONE) 25 MG tablet Take 0.5 tablets (12.5 mg total) by mouth daily. 15 tablet 11  . VYNDAQEL 20 MG CAPS TAKE 4 CAPSULES (80 MG) BY MOUTH ONCE DAILY 120 capsule 0  . warfarin (COUMADIN) 5 MG tablet Take 1 to 2 tablets by mouth daily as directed (Patient taking differently: Take 7.5 mg by mouth daily. ) 45 tablet 3   No current facility-administered medications for this encounter.    Allergies  Allergen Reactions  . Peanut-Containing Drug Products Swelling  . Penicillins Other (See Comments)    CONVULSIONS Has patient had a PCN reaction causing immediate rash, facial/tongue/throat swelling, SOB or lightheadedness with hypotension: No Has patient had a PCN reaction causing severe rash involving mucus membranes or skin necrosis: No Has patient had a PCN reaction that required hospitalization No Has patient had a PCN reaction occurring within the last 10 years: No If all of the above answers are "NO", then may proceed with Cephalosporin use.   . Decadron [Dexamethasone] Itching      Social History   Socioeconomic History  . Marital status: Legally Separated    Spouse name: Not on file  . Number of children: 3  . Years of education: Not on file  . Highest education level: Not on  file  Occupational History  . Not on file  Tobacco Use  . Smoking status: Former Smoker    Packs/day: 1.50    Years: 8.00    Pack years: 12.00    Quit date: 03/15/2003    Years since quitting: 16.5  . Smokeless tobacco: Never Used  Substance and Sexual Activity  . Alcohol use: No    Comment: none since 2004  . Drug use: No    Comment: former  none since 2004  . Sexual activity: Not Currently  Other Topics Concern  . Not on file  Social History Narrative  . Not on file   Social Determinants of Health   Financial Resource Strain:   . Difficulty of Paying Living Expenses: Not on file  Food  Insecurity:   . Worried About Charity fundraiser in the Last Year: Not on file  . Ran Out of Food in the Last Year: Not on file  Transportation Needs:   . Lack of Transportation (Medical): Not on file  . Lack of Transportation (Non-Medical): Not on file  Physical Activity:   . Days of Exercise per Week: Not on file  . Minutes of Exercise per Session: Not on file  Stress:   . Feeling of Stress : Not on file  Social Connections:   . Frequency of Communication with Friends and Family: Not on file  . Frequency of Social Gatherings with Friends and Family: Not on file  . Attends Religious Services: Not on file  . Active Member of Clubs or Organizations: Not on file  . Attends Archivist Meetings: Not on file  . Marital Status: Not on file  Intimate Partner Violence:   . Fear of Current or Ex-Partner: Not on file  . Emotionally Abused: Not on file  . Physically Abused: Not on file  . Sexually Abused: Not on file      Family History  Problem Relation Age of Onset  . Diabetes Mother   . Diabetes Sister     Vitals:   10/11/19 1042  BP: 100/70  Pulse: (!) 101  SpO2: 99%  Weight: 77.4 kg (170 lb 9.6 oz)    Wt Readings from Last 3 Encounters:  10/11/19 77.4 kg (170 lb 9.6 oz)  09/23/19 78.9 kg (174 lb)  08/06/19 76.7 kg (169 lb)    PHYSICAL EXAM: General:  Well  appearing. No resp difficulty HEENT: normal Neck: supple. no JVD. Carotids 2+ bilat; no bruits. No lymphadenopathy or thryomegaly appreciated. Cor: PMI nondisplaced. Regular tachy . No rubs, gallops or murmurs. Lungs: clear Abdomen: soft, nontender, nondistended. No hepatosplenomegaly. No bruits or masses. Good bowel sounds. Extremities: no cyanosis, clubbing, rash, edema Neuro: alert & orientedx3, cranial nerves grossly intact. moves all 4 extremities w/o difficulty. Affect pleasant   ASSESSMENT & PLAN: 1. Chronic Systolic Heart Failure, [facility]. Normal LHC 2017. 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 No M-spike, normal IFE on myeloma panel.  - S/p St Jude ICD implant 06/17/18 - CPX 5/19 pVO2 23 but slope 41 - Echo 11/20 EF 30-35% - Now on tafamadis - Doing well stable. NYHA II  - Volume status looks good.  - Continue lasix 40 mg daily  - Continue carvedilol 12.5 mg twice a day.  - Continue Entresto 97-103 mg twice a day   - Continue spiro 12.5 mg daily. - On hydralazine 50 tid. Will try to add Imdur 30 - Consider ivabradine and SGLT2i as we go.  - Eventually will need SGLT2i - ICD interrogated personally today in Clinic no VT/AF. Volume ok  - Will repeat PYP and see if TTR progressing or not  - Recent labs ok   2.DMII - Stable On metformin - Consider SGLT2i  3. HTN - Blood pressure well controlled. Continue current regimen.  4. PE - CTA 04/2018 with large bilateral PE - Was on Eliquis. Now back on warfarin due to cost. No bleeding.     [name], MD  11:00 AM

## 2019-10-13 ENCOUNTER — Ambulatory Visit (INDEPENDENT_AMBULATORY_CARE_PROVIDER_SITE_OTHER): Payer: Medicaid Other | Admitting: Pharmacist Clinician (PhC)/ Clinical Pharmacy Specialist

## 2019-10-13 ENCOUNTER — Other Ambulatory Visit: Payer: Self-pay

## 2019-10-13 DIAGNOSIS — Z7901 Long term (current) use of anticoagulants: Secondary | ICD-10-CM | POA: Diagnosis not present

## 2019-10-13 DIAGNOSIS — I2699 Other pulmonary embolism without acute cor pulmonale: Secondary | ICD-10-CM

## 2019-10-13 DIAGNOSIS — I824Y9 Acute embolism and thrombosis of unspecified deep veins of unspecified proximal lower extremity: Secondary | ICD-10-CM

## 2019-10-13 LAB — POCT INR: INR: 2.9 (ref 2.0–3.0)

## 2019-10-13 MED ORDER — ENOXAPARIN SODIUM 80 MG/0.8ML ~~LOC~~ SOLN: SUBCUTANEOUS | 0 refills | Status: DC

## 2019-10-13 NOTE — Patient Instructions (Addendum)
January 19: Last dose of Coumadin.  January 20: No Coumadin or Lovenox.  January 21: Inject Lovenox 80 mg in the fatty abdominal tissue at least 2 inches from the belly button twice a day about 12 hours apart, 8am and 8pm rotate sites. No Coumadin.  January 22: Inject Lovenox in the fatty tissue every 12 hours, 8am and 8pm. No Coumadin.  January 23: Inject Lovenox in the fatty tissue every 12 hours, 8am and 8pm. No Coumadin.  January 24: Inject Lovenox in the fatty tissue in the morning at 8 am (No PM dose). No Coumadin.  January 25: Procedure Day - No Lovenox - Resume Coumadin 1 tablet (7.5 mg)  January 26: Resume Lovenox inject in the fatty tissue every 12 hours and take Coumadin 10 mg (2 tablets).  January 27: Inject Lovenox in the fatty tissue every 12 hours and take Coumadin 10 mg (2 tablets)  January 28: Inject Lovenox in the fatty tissue every 12 hours and take Coumadin 10 mg (2 tablets)  Jnauary 29: Inject Lovenox in the fatty tissue every 12 hours and take Coumadin 7.5 mg (1.5 tablets)  January 30: Inject Lovenox in the fatty tissue every 12 hours and take Coumadin 7.5 mg (1.5 tablets)  January 31: Inject Lovenox in the fatty tissue in the morning only and take Coumadin 7.5 mg (1.5 tablets)  February 1:  Repeat INR appointment

## 2019-10-15 ENCOUNTER — Other Ambulatory Visit: Payer: Self-pay | Admitting: Internal Medicine

## 2019-10-15 DIAGNOSIS — G4700 Insomnia, unspecified: Secondary | ICD-10-CM

## 2019-10-20 NOTE — Patient Instructions (Addendum)
DUE TO COVID-19 ONLY ONE VISITOR IS ALLOWED TO COME WITH YOU AND STAY IN THE WAITING ROOM ONLY DURING PRE OP AND PROCEDURE DAY OF SURGERY. THE 1 VISITOR MAY VISIT WITH YOU AFTER SURGERY IN YOUR PRIVATE ROOM DURING VISITING HOURS ONLY!  YOU NEED TO HAVE A COVID 19 TEST ON: 10/21/19 AT 2:10 PM, THIS TEST MUST BE DONE BEFORE SURGERY, COME  [place], [address] , [address].  ([place]) ONCE YOUR COVID TEST IS COMPLETED, PLEASE BEGIN THE QUARANTINE INSTRUCTIONS AS OUTLINED IN YOUR HANDOUT.                [name]        Your procedure is scheduled on: 10/25/2019   Report to [facility] Main  Entrance   Report to short stay at: 5:30 AM     Call this number if you have problems the morning of surgery [phone]    Remember:   Do not eat food or drink liquids :After Midnight.   BRUSH YOUR TEETH MORNING OF SURGERY AND RINSE YOUR MOUTH OUT, NO CHEWING GUM CANDY OR MINTS.     Take these medicines the morning of surgery with A SIP OF WATER: atorvastatin,carvedilol,hydralazine,isosorbide,vyndaquel.   How to Manage Your Diabetes Before and After Surgery  Why is it important to control my blood sugar before and after surgery? . Improving blood sugar levels before and after surgery helps healing and can limit problems. . A way of improving blood sugar control is eating a healthy diet by: o  Eating less sugar and carbohydrates o  Increasing activity/exercise o  Talking with your doctor about reaching your blood sugar goals . High blood sugars (greater than 180 mg/dL) can raise your risk of infections and slow your recovery, so you will need to focus on controlling your diabetes during the weeks before surgery. . Make sure that the doctor who takes care of your diabetes knows about your planned surgery including the date and location.  How do I manage my blood sugar before surgery? . Check your blood sugar at least 4 times a day, starting 2 days before  surgery, to make sure that the level is not too high or low. o Check your blood sugar the morning of your surgery when you wake up and every 2 hours until you get to the Short Stay unit. . If your blood sugar is less than 70 mg/dL, you will need to treat for low blood sugar: o Do not take insulin. o Treat a low blood sugar (less than 70 mg/dL) with  cup of clear juice (cranberry or apple), 4 glucose tablets, OR glucose gel. o Recheck blood sugar in 15 minutes after treatment (to make sure it is greater than 70 mg/dL). If your blood sugar is not greater than 70 mg/dL on recheck, call [phone] for further instructions. . Report your blood sugar to the short stay nurse when you get to Short Stay.  . If you are admitted to the hospital after surgery: o Your blood sugar will be checked by the staff and you will probably be given insulin after surgery (instead of oral diabetes medicines) to make sure you have good blood sugar levels. o The goal for blood sugar control after surgery is 80-180 mg/dL.   WHAT DO I DO ABOUT MY DIABETES MEDICATION?  Marland Kitchen  . THE DAY BEFORE SURGERY: TAKE METFORMIN AS USUAL      . THE MORNING OF SURGERY:  DO NOT  TAKE METFORMIN       DO NOT TAKE ANY DIABETIC MEDICATIONS DAY OF YOUR SURGERY                               You may not have any metal on your body including hair pins and              piercings  Do not wear jewelry,lotions, powders or perfumes, deodorant                          Men may shave face and neck.   Do not bring valuables to the hospital. [place].  Contacts, dentures or bridgework may not be worn into surgery.  Leave suitcase in the car. After surgery it may be brought to your room.     Patients discharged the day of surgery will not be allowed to drive home. IF YOU ARE HAVING SURGERY AND GOING HOME THE SAME DAY, YOU MUST HAVE AN ADULT TO DRIVE YOU HOME AND BE WITH YOU FOR 24 HOURS. YOU MAY  GO HOME BY TAXI OR UBER OR ORTHERWISE, BUT AN ADULT MUST ACCOMPANY YOU HOME AND STAY WITH YOU FOR 24 HOURS.  Name and phone number of your driver:  Special Instructions: N/A              Please read over the following fact sheets you were given: _____________________________________________________________________             [facility] - Preparing for Surgery Before surgery, you can play an important role.  Because skin is not sterile, your skin needs to be as free of germs as possible.  You can reduce the number of germs on your skin by washing with CHG (chlorahexidine gluconate) soap before surgery.  CHG is an antiseptic cleaner which kills germs and bonds with the skin to continue killing germs even after washing. Please DO NOT use if you have an allergy to CHG or antibacterial soaps.  If your skin becomes reddened/irritated stop using the CHG and inform your nurse when you arrive at Short Stay. Do not shave (including legs and underarms) for at least 48 hours prior to the first CHG shower.  You may shave your face/neck. Please follow these instructions carefully:  1.  Shower with CHG Soap the night before surgery and the  morning of Surgery.  2.  If you choose to wash your hair, wash your hair first as usual with your  normal  shampoo.  3.  After you shampoo, rinse your hair and body thoroughly to remove the  shampoo.                           4.  Use CHG as you would any other liquid soap.  You can apply chg directly  to the skin and wash                       Gently with a scrungie or clean washcloth.  5.  Apply the CHG Soap to your body ONLY FROM THE NECK DOWN.   Do not use on face/ open  Wound or open sores. Avoid contact with eyes, ears mouth and genitals (private parts).                       Wash face,  Genitals (private parts) with your normal soap.             6.  Wash thoroughly, paying special attention to the area where your surgery  will be  performed.  7.  Thoroughly rinse your body with warm water from the neck down.  8.  DO NOT shower/wash with your normal soap after using and rinsing off  the CHG Soap.                9.  Pat yourself dry with a clean towel.            10.  Wear clean pajamas.            11.  Place clean sheets on your bed the night of your first shower and do not  sleep with pets. Day of Surgery : Do not apply any lotions/deodorants the morning of surgery.  Please wear clean clothes to the hospital/surgery center.  FAILURE TO FOLLOW THESE INSTRUCTIONS MAY RESULT IN THE CANCELLATION OF YOUR SURGERY PATIENT SIGNATURE_________________________________  NURSE SIGNATURE__________________________________  ________________________________________________________________________

## 2019-10-21 ENCOUNTER — Other Ambulatory Visit (HOSPITAL_COMMUNITY)
Admission: RE | Admit: 2019-10-21 | Discharge: 2019-10-21 | Disposition: A | Discharge status: Home / Self Care | Payer: Medicaid Other | Source: Ambulatory Visit | Attending: Surgery | Admitting: Surgery

## 2019-10-21 ENCOUNTER — Other Ambulatory Visit: Payer: Self-pay

## 2019-10-21 ENCOUNTER — Encounter (HOSPITAL_COMMUNITY)
Admission: RE | Admit: 2019-10-21 | Discharge: 2019-10-21 | Disposition: A | Discharge status: Home / Self Care | Payer: Medicaid Other | Source: Ambulatory Visit | Attending: Surgery | Admitting: Surgery

## 2019-10-21 ENCOUNTER — Inpatient Hospital Stay (HOSPITAL_COMMUNITY): Admission: RE | Admit: 2019-10-21 | Payer: Self-pay | Source: Ambulatory Visit

## 2019-10-21 ENCOUNTER — Telehealth (HOSPITAL_COMMUNITY): Payer: Self-pay | Admitting: *Deleted

## 2019-10-21 ENCOUNTER — Encounter (HOSPITAL_COMMUNITY): Payer: Self-pay

## 2019-10-21 ENCOUNTER — Other Ambulatory Visit (HOSPITAL_COMMUNITY): Payer: Self-pay

## 2019-10-21 DIAGNOSIS — I110 Hypertensive heart disease with heart failure: Secondary | ICD-10-CM | POA: Insufficient documentation

## 2019-10-21 DIAGNOSIS — Z7984 Long term (current) use of oral hypoglycemic drugs: Secondary | ICD-10-CM | POA: Insufficient documentation

## 2019-10-21 DIAGNOSIS — Z95810 Presence of automatic (implantable) cardiac defibrillator: Secondary | ICD-10-CM | POA: Insufficient documentation

## 2019-10-21 DIAGNOSIS — F329 Major depressive disorder, single episode, unspecified: Secondary | ICD-10-CM | POA: Insufficient documentation

## 2019-10-21 DIAGNOSIS — I080 Rheumatic disorders of both mitral and aortic valves: Secondary | ICD-10-CM | POA: Diagnosis not present

## 2019-10-21 DIAGNOSIS — F419 Anxiety disorder, unspecified: Secondary | ICD-10-CM | POA: Insufficient documentation

## 2019-10-21 DIAGNOSIS — I5042 Chronic combined systolic (congestive) and diastolic (congestive) heart failure: Secondary | ICD-10-CM | POA: Diagnosis not present

## 2019-10-21 DIAGNOSIS — R7303 Prediabetes: Secondary | ICD-10-CM | POA: Insufficient documentation

## 2019-10-21 DIAGNOSIS — R569 Unspecified convulsions: Secondary | ICD-10-CM | POA: Insufficient documentation

## 2019-10-21 DIAGNOSIS — Z87891 Personal history of nicotine dependence: Secondary | ICD-10-CM | POA: Diagnosis not present

## 2019-10-21 DIAGNOSIS — I428 Other cardiomyopathies: Secondary | ICD-10-CM | POA: Diagnosis not present

## 2019-10-21 DIAGNOSIS — M4322 Fusion of spine, cervical region: Secondary | ICD-10-CM | POA: Insufficient documentation

## 2019-10-21 DIAGNOSIS — Z79899 Other long term (current) drug therapy: Secondary | ICD-10-CM | POA: Insufficient documentation

## 2019-10-21 DIAGNOSIS — K436 Other and unspecified ventral hernia with obstruction, without gangrene: Secondary | ICD-10-CM | POA: Diagnosis not present

## 2019-10-21 DIAGNOSIS — Z7901 Long term (current) use of anticoagulants: Secondary | ICD-10-CM | POA: Diagnosis not present

## 2019-10-21 DIAGNOSIS — Z01812 Encounter for preprocedural laboratory examination: Secondary | ICD-10-CM | POA: Insufficient documentation

## 2019-10-21 DIAGNOSIS — Z20822 Contact with and (suspected) exposure to covid-19: Secondary | ICD-10-CM | POA: Diagnosis not present

## 2019-10-21 DIAGNOSIS — F1911 Other psychoactive substance abuse, in remission: Secondary | ICD-10-CM | POA: Diagnosis not present

## 2019-10-21 HISTORY — DX: Pneumonia, unspecified organism: J18.9

## 2019-10-21 HISTORY — DX: Personal history of other diseases of the digestive system: Z87.19

## 2019-10-21 LAB — BASIC METABOLIC PANEL
Anion gap: 13 (ref 5–15)
BUN: 17 mg/dL (ref 8–23)
CO2: 18 mmol/L — ABNORMAL LOW (ref 22–32)
Calcium: 9.4 mg/dL (ref 8.9–10.3)
Chloride: 104 mmol/L (ref 98–111)
Creatinine, Ser: 1.44 mg/dL — ABNORMAL HIGH (ref 0.61–1.24)
GFR calc Af Amer: 59 mL/min — ABNORMAL LOW (ref >60)
GFR calc non Af Amer: 51 mL/min — ABNORMAL LOW (ref >60)
Glucose, Bld: 98 mg/dL (ref 70–99)
Potassium: 5.6 mmol/L — ABNORMAL HIGH (ref 3.5–5.1)
Sodium: 135 mmol/L (ref 135–145)

## 2019-10-21 LAB — CBC
HCT: 41.2 % (ref 39.0–52.0)
Hemoglobin: 13.1 g/dL (ref 13.0–17.0)
MCH: 28.4 pg (ref 26.0–34.0)
MCHC: 31.8 g/dL (ref 30.0–36.0)
MCV: 89.2 fL (ref 80.0–100.0)
Platelets: 229 10*3/uL (ref 150–400)
RBC: 4.62 MIL/uL (ref 4.22–5.81)
RDW: 15.7 % — ABNORMAL HIGH (ref 11.5–15.5)
WBC: 7 10*3/uL (ref 4.0–10.5)
nRBC: 0 % (ref 0.0–0.2)

## 2019-10-21 LAB — SARS CORONAVIRUS 2 (TAT 6-24 HRS): SARS Coronavirus 2: NEGATIVE

## 2019-10-21 NOTE — Telephone Encounter (Signed)
Pt is scheduled for hernia repair on 1/25 at [facility] but needs cardiac clearance. Patient was seen in office by Dr.Bensimhon on 1/11.  Clearance needs to be faxed to [phone]   Routed to [place] for clearance

## 2019-10-21 NOTE — Telephone Encounter (Signed)
Mild to moderate risk for peri-op CV complications. Ok to proceed.

## 2019-10-21 NOTE — Progress Notes (Addendum)
PCP - Dr.Deborah Wynetta Emery at [facility]. LOV: 08/2019 Cardiologist - Dr. [name]. Dr. Haroldine Laws. LOV: 1/11. Epic. Clearance: 10/22/2019.  Chest x-ray - 09/23/19. EPIC EKG - 10/11/19. EPIC Stress Test -  ECHO - 08/16/19. EPIC Cardiac Cath -   Sleep Study -  CPAP -   Fasting Blood Sugar -  Checks Blood Sugar _____ times a day  Blood Thinner Instructions: Coumadin/Lovenox bridge. Manage by cardiologist. Aspirin Instructions: Last Dose:  Anesthesia review:   Patient denies shortness of breath, fever, cough and chest pain at PAT appointment   Patient verbalized understanding of instructions that were given to them at the PAT appointment. Patient was also instructed that they will need to review over the PAT instructions again at home before surgery.

## 2019-10-21 NOTE — Progress Notes (Signed)
ICD orders has been requested from [facility].

## 2019-10-22 NOTE — Progress Notes (Signed)
Pt. Has been informed about the changes on the surgical time,he is aware that he needs to come to the hospital at 7:30 am instead of 5:30 on 10/25/19.

## 2019-10-22 NOTE — Progress Notes (Signed)
Both medical clearance and ICD orders were place in pt's chart.

## 2019-10-22 NOTE — Telephone Encounter (Signed)
Faxed to [phone]

## 2019-10-22 NOTE — Progress Notes (Signed)
Anesthesia Chart Review   Case: [id number] Date/Time: 10/25/19 0915   Procedure: HERNIA REPAIR VENTRAL WITH MESH (N/A )   Anesthesia type: General   Pre-op diagnosis: HERNIA INCARCERATED   Location: [place] 02 / [facility]   Surgeons: [name], MD      DISCUSSION:64 y.o. former smoker (12 pack years, quit 03/15/03) with h/o pre-diabetes, HTN, previous polysubstance abuse - including heroin (quit 2004), S/p St Jude ICD implant 06/17/18 (last check 10/11/19, orders on chart), PE 04/2018 on Warfarin, nonischemic cardiomyopathy, incarcerated hernia scheduled for above procedure 10/25/19 with Dr. Romana Juniper.    Cleared by cardiologist.  Per Dr. Haroldine Laws, "Mild to moderate risk for peri-op CV complications. Ok to proceed."  Pt given instructions regarding stopping Coumadin and starting Lovenox bridge.  Instructions outlined in OV note with pharmacist 10/13/19.    Anticipate pt can proceed with planned procedure barring acute status change.   VS: BP 131/73   Pulse 97   Temp (!) 36.4 C (Oral)   Resp 16   Ht 5\' 6"  (1.676 m)   Wt 79 kg   SpO2 100%   BMI 28.12 kg/m   PROVIDERS: Ladell Pier, MD is PCP   [name], MD is Cardiologist  LABS: Labs forwarded to surgeon, will repeat BMP (all labs ordered are listed, but only abnormal results are displayed)  Labs Reviewed  BASIC METABOLIC PANEL - Abnormal; Notable for the following components:      Result Value   Potassium 5.6 (*)    CO2 18 (*)    Creatinine, Ser 1.44 (*)    GFR calc non Af Amer 51 (*)    GFR calc Af Amer 59 (*)    All other components within normal limits  CBC - Abnormal; Notable for the following components:   RDW 15.7 (*)    All other components within normal limits     IMAGES:   EKG: 10/11/19 Rate 101 bpm  Sinus tachycardia Otherwise normal ECG HR is faster since previous ecg ECG OTHERWISE WITHIN NORMAL LIMITS No significant change since   CV: Echo 08/16/2019 IMPRESSIONS    1.  Left ventricular ejection fraction, by visual estimation, is 30 to 35%. The left ventricle has moderately decreased function. There is no left ventricular hypertrophy.  2. Definity contrast agent was given IV to delineate the left ventricular endocardial borders.  3. Left ventricular diastolic parameters are consistent with Grade I diastolic dysfunction (impaired relaxation).  4. Global right ventricle has normal systolic function.The right ventricular size is normal. No increase in right ventricular wall thickness.  5. Left atrial size was normal.  6. Right atrial size was normal.  7. The mitral valve is normal in structure. Trace mitral valve regurgitation. No evidence of mitral stenosis.  8. The tricuspid valve is normal in structure. Tricuspid valve regurgitation is trivial.  9. The aortic valve is tricuspid. Aortic valve regurgitation is not visualized. Mild to moderate aortic valve sclerosis/calcification without any evidence of aortic stenosis. 10. The pulmonic valve was normal in structure. Pulmonic valve regurgitation is not visualized. 11. Normal pulmonary artery systolic pressure. 12. A pacer wire is visualized. 13. The inferior vena cava is normal in size with greater than 50% respiratory variability, suggesting right atrial pressure of 3 mmHg.  Cardiac Cath 03/11/2016  There is mild to moderate left ventricular systolic dysfunction.  Angiographically normal coronary arteries with a codominant system  Mild-moderately elevated LVEDP   Nonischemic Cardiomyopathy  Suspect chest pain is related to  increased LV filling pressures. Probably still could tolerate some diuresis.  Plan: Return to nursing floor for ongoing care and TR band removal. Continue current management by primary service. Past Medical History:  Diagnosis Date  . Abnormal TSH 02/2016  . Anxiety   . Cervical disc disease   . Chronic combined systolic and diastolic CHF (congestive heart failure) ([place])    a.  01/2016 Echo: EF 25%, inf AK, diffuse sev HK, Gr1 DD, mild MR.  . Depression   . History of hiatal hernia   . Hyperglycemia   . Hypertension   . Hypertensive heart disease   . Myocardial infarction [facility])    ??  maybe  . NICM (nonischemic cardiomyopathy) ([place])    a. 01/2016 Echo: EF 25%, inf AK, diffuse sev HK, Gr1 DD, mild MR; b. 01/2016 MV: EF 22%, no isch/infarct;  c. 02/2016 Cath: Nl cors, EF 35-45%.  . Pneumonia   . Pre-diabetes   . Seizures ([place])    with penicillin    Past Surgical History:  Procedure Laterality Date  . ANTERIOR CERVICAL DECOMP/DISCECTOMY FUSION N/A 07/10/2016   Procedure: ANTERIOR CERVICAL DECOMPRESSION FUSION CERVICAL 4-5, CERVICAL 5-6, CERVICAL 6-7 WITH INSTRUMENTATION AND ALLOGRAFT;  Surgeon: [name], MD;  Location: [place];  Service: Orthopedics;  Laterality: N/A;  . CARDIAC CATHETERIZATION N/A 03/11/2016   Procedure: Left Heart Cath and Coronary Angiography;  Surgeon: [name], MD;  Location: [place] [facility];  Service: Cardiovascular;  Laterality: N/A;  . ICD IMPLANT N/A 06/17/2018   Procedure: ICD IMPLANT;  Surgeon: [name], MD;  Location: [place] [facility];  Service: Cardiovascular;  Laterality: N/A;  . Thumb surgery Right     MEDICATIONS: . acetaminophen (TYLENOL) 500 MG tablet  . amitriptyline (ELAVIL) 25 MG tablet  . Aromatic Inhalants (VICKS VAPOINHALER) INHA  . atorvastatin (LIPITOR) 20 MG tablet  . carvedilol (COREG) 25 MG tablet  . Chlorphen-Phenyleph-ASA (ALKA-SELTZER PLUS COLD PO)  . Cholecalciferol (VITAMIN D) 50 MCG (2000 UT) tablet  . enoxaparin (LOVENOX) 80 MG/0.8ML injection  . furosemide (LASIX) 40 MG tablet  . hydrALAZINE (APRESOLINE) 50 MG tablet  . isosorbide mononitrate (IMDUR) 30 MG 24 hr tablet  . metFORMIN (GLUCOPHAGE) 500 MG tablet  . methocarbamol (ROBAXIN) 500 MG tablet  . pseudoephedrine (SUDAFED) 30 MG tablet  . sacubitril-valsartan (ENTRESTO) 97-103 MG  . sildenafil (VIAGRA) 50 MG tablet  .  spironolactone (ALDACTONE) 25 MG tablet  . VYNDAQEL 20 MG CAPS  . warfarin (COUMADIN) 5 MG tablet   No current facility-administered medications for this encounter.    Maia [facility] Pre-Surgical Testing [phone] 10/22/19  10:52 AM

## 2019-10-24 MED ORDER — BUPIVACAINE LIPOSOME 1.3 % IJ SUSP
20.0000 mL | Freq: Once | INTRAMUSCULAR | Status: DC
  Filled 2019-10-24: qty 20

## 2019-10-25 ENCOUNTER — Other Ambulatory Visit: Payer: Self-pay

## 2019-10-25 ENCOUNTER — Other Ambulatory Visit (HOSPITAL_COMMUNITY): Payer: Self-pay

## 2019-10-25 ENCOUNTER — Ambulatory Visit (HOSPITAL_COMMUNITY)
Admission: RE | Admit: 2019-10-25 | Discharge: 2019-10-25 | Disposition: A | Discharge status: Home / Self Care | Payer: Medicaid Other | Attending: Surgery | Admitting: Surgery

## 2019-10-25 ENCOUNTER — Ambulatory Visit (HOSPITAL_COMMUNITY): Payer: Medicaid Other | Admitting: Physician Assistant

## 2019-10-25 ENCOUNTER — Encounter (HOSPITAL_COMMUNITY)
Admission: RE | Disposition: A | Discharge status: Home / Self Care | Payer: Self-pay | Source: Home / Self Care | Attending: Surgery

## 2019-10-25 ENCOUNTER — Encounter (HOSPITAL_COMMUNITY): Payer: Self-pay | Admitting: Surgery

## 2019-10-25 DIAGNOSIS — E1122 Type 2 diabetes mellitus with diabetic chronic kidney disease: Secondary | ICD-10-CM | POA: Insufficient documentation

## 2019-10-25 DIAGNOSIS — I252 Old myocardial infarction: Secondary | ICD-10-CM | POA: Insufficient documentation

## 2019-10-25 DIAGNOSIS — I509 Heart failure, unspecified: Secondary | ICD-10-CM | POA: Insufficient documentation

## 2019-10-25 DIAGNOSIS — Z87891 Personal history of nicotine dependence: Secondary | ICD-10-CM | POA: Insufficient documentation

## 2019-10-25 DIAGNOSIS — Z8616 Personal history of COVID-19: Secondary | ICD-10-CM | POA: Insufficient documentation

## 2019-10-25 DIAGNOSIS — Z86711 Personal history of pulmonary embolism: Secondary | ICD-10-CM | POA: Insufficient documentation

## 2019-10-25 DIAGNOSIS — K436 Other and unspecified ventral hernia with obstruction, without gangrene: Secondary | ICD-10-CM | POA: Insufficient documentation

## 2019-10-25 DIAGNOSIS — Z79899 Other long term (current) drug therapy: Secondary | ICD-10-CM | POA: Insufficient documentation

## 2019-10-25 DIAGNOSIS — Z95810 Presence of automatic (implantable) cardiac defibrillator: Secondary | ICD-10-CM | POA: Insufficient documentation

## 2019-10-25 DIAGNOSIS — Z880 Allergy status to penicillin: Secondary | ICD-10-CM | POA: Insufficient documentation

## 2019-10-25 DIAGNOSIS — Z7901 Long term (current) use of anticoagulants: Secondary | ICD-10-CM | POA: Insufficient documentation

## 2019-10-25 DIAGNOSIS — I130 Hypertensive heart and chronic kidney disease with heart failure and stage 1 through stage 4 chronic kidney disease, or unspecified chronic kidney disease: Secondary | ICD-10-CM | POA: Insufficient documentation

## 2019-10-25 DIAGNOSIS — Z888 Allergy status to other drugs, medicaments and biological substances status: Secondary | ICD-10-CM | POA: Insufficient documentation

## 2019-10-25 DIAGNOSIS — N189 Chronic kidney disease, unspecified: Secondary | ICD-10-CM | POA: Insufficient documentation

## 2019-10-25 DIAGNOSIS — Z7984 Long term (current) use of oral hypoglycemic drugs: Secondary | ICD-10-CM | POA: Insufficient documentation

## 2019-10-25 HISTORY — PX: VENTRAL HERNIA REPAIR: SHX424

## 2019-10-25 LAB — GLUCOSE, CAPILLARY: Glucose-Capillary: 152 mg/dL — ABNORMAL HIGH (ref 70–99)

## 2019-10-25 SURGERY — REPAIR, HERNIA, VENTRAL
Anesthesia: General | Site: Abdomen

## 2019-10-25 MED ORDER — PROPOFOL 10 MG/ML IV BOLUS
INTRAVENOUS | Status: DC | PRN
  Administered 2019-10-25: 80 mg via INTRAVENOUS

## 2019-10-25 MED ORDER — ACETAMINOPHEN 650 MG RE SUPP
650.0000 mg | RECTAL | Status: DC | PRN
  Filled 2019-10-25: qty 1

## 2019-10-25 MED ORDER — PROPOFOL 10 MG/ML IV BOLUS
INTRAVENOUS | Status: AC
  Filled 2019-10-25: qty 20

## 2019-10-25 MED ORDER — ACETAMINOPHEN 500 MG PO TABS
1000.0000 mg | ORAL_TABLET | ORAL | Status: AC
  Administered 2019-10-25: 1000 mg via ORAL
  Filled 2019-10-25: qty 2

## 2019-10-25 MED ORDER — ACETAMINOPHEN 500 MG PO TABS: 1000.0000 mg | ORAL_TABLET | Freq: Once | ORAL | Status: DC | PRN

## 2019-10-25 MED ORDER — GABAPENTIN 300 MG PO CAPS
300.0000 mg | ORAL_CAPSULE | ORAL | Status: AC
  Administered 2019-10-25: 08:00:00 300 mg via ORAL
  Filled 2019-10-25: qty 1

## 2019-10-25 MED ORDER — HYDROMORPHONE HCL 1 MG/ML IJ SOLN
INTRAMUSCULAR | Status: AC
  Filled 2019-10-25: qty 1

## 2019-10-25 MED ORDER — ONDANSETRON HCL 4 MG/2ML IJ SOLN
INTRAMUSCULAR | Status: AC
  Filled 2019-10-25: qty 2

## 2019-10-25 MED ORDER — CHLORHEXIDINE GLUCONATE 4 % EX LIQD: 60.0000 mL | Freq: Once | CUTANEOUS | Status: DC

## 2019-10-25 MED ORDER — ONDANSETRON HCL 4 MG/2ML IJ SOLN
INTRAMUSCULAR | Status: DC | PRN
  Administered 2019-10-25: 4 mg via INTRAVENOUS

## 2019-10-25 MED ORDER — FENTANYL CITRATE (PF) 250 MCG/5ML IJ SOLN
INTRAMUSCULAR | Status: AC
  Filled 2019-10-25: qty 5

## 2019-10-25 MED ORDER — SUGAMMADEX SODIUM 200 MG/2ML IV SOLN
INTRAVENOUS | Status: DC | PRN
  Administered 2019-10-25: 200 mg via INTRAVENOUS

## 2019-10-25 MED ORDER — SODIUM CHLORIDE 0.9% FLUSH: 3.0000 mL | Freq: Two times a day (BID) | INTRAVENOUS | Status: DC

## 2019-10-25 MED ORDER — LACTATED RINGERS IV SOLN: INTRAVENOUS | Status: DC

## 2019-10-25 MED ORDER — ACETAMINOPHEN 160 MG/5ML PO SOLN: 1000.0000 mg | Freq: Once | ORAL | Status: DC | PRN

## 2019-10-25 MED ORDER — SODIUM CHLORIDE 0.9 % IV SOLN: 250.0000 mL | INTRAVENOUS | Status: DC | PRN

## 2019-10-25 MED ORDER — OXYCODONE HCL 5 MG PO TABS: 5.0000 mg | ORAL_TABLET | Freq: Three times a day (TID) | ORAL | 0 refills | Status: DC | PRN

## 2019-10-25 MED ORDER — VANCOMYCIN HCL IN DEXTROSE 1-5 GM/200ML-% IV SOLN
1000.0000 mg | INTRAVENOUS | Status: AC
  Administered 2019-10-25: 09:00:00 1000 mg via INTRAVENOUS
  Filled 2019-10-25: qty 200

## 2019-10-25 MED ORDER — MIDAZOLAM HCL 2 MG/2ML IJ SOLN
INTRAMUSCULAR | Status: AC
  Filled 2019-10-25: qty 2

## 2019-10-25 MED ORDER — DOCUSATE SODIUM 100 MG PO CAPS: 100.0000 mg | ORAL_CAPSULE | Freq: Two times a day (BID) | ORAL | 0 refills | Status: AC

## 2019-10-25 MED ORDER — PHENYLEPHRINE 40 MCG/ML (10ML) SYRINGE FOR IV PUSH (FOR BLOOD PRESSURE SUPPORT)
PREFILLED_SYRINGE | INTRAVENOUS | Status: DC | PRN
  Administered 2019-10-25 (×4): 80 ug via INTRAVENOUS

## 2019-10-25 MED ORDER — SODIUM CHLORIDE 0.9% FLUSH: 3.0000 mL | INTRAVENOUS | Status: DC | PRN

## 2019-10-25 MED ORDER — OXYCODONE HCL 5 MG/5ML PO SOLN: 5.0000 mg | Freq: Once | ORAL | Status: DC | PRN

## 2019-10-25 MED ORDER — FENTANYL CITRATE (PF) 100 MCG/2ML IJ SOLN: 25.0000 ug | INTRAMUSCULAR | Status: DC | PRN

## 2019-10-25 MED ORDER — ACETAMINOPHEN 325 MG PO TABS: 650.0000 mg | ORAL_TABLET | ORAL | Status: DC | PRN

## 2019-10-25 MED ORDER — LIDOCAINE 2% (20 MG/ML) 5 ML SYRINGE
INTRAMUSCULAR | Status: DC | PRN
  Administered 2019-10-25: 60 mg via INTRAVENOUS

## 2019-10-25 MED ORDER — LIDOCAINE 2% (20 MG/ML) 5 ML SYRINGE
INTRAMUSCULAR | Status: AC
  Filled 2019-10-25: qty 5

## 2019-10-25 MED ORDER — EPHEDRINE SULFATE-NACL 50-0.9 MG/10ML-% IV SOSY
PREFILLED_SYRINGE | INTRAVENOUS | Status: DC | PRN
  Administered 2019-10-25 (×2): 10 mg via INTRAVENOUS

## 2019-10-25 MED ORDER — FENTANYL CITRATE (PF) 100 MCG/2ML IJ SOLN
INTRAMUSCULAR | Status: DC | PRN
  Administered 2019-10-25: 100 ug via INTRAVENOUS

## 2019-10-25 MED ORDER — 0.9 % SODIUM CHLORIDE (POUR BTL) OPTIME
TOPICAL | Status: DC | PRN
  Administered 2019-10-25: 1000 mL

## 2019-10-25 MED ORDER — ALBUTEROL SULFATE (2.5 MG/3ML) 0.083% IN NEBU
INHALATION_SOLUTION | RESPIRATORY_TRACT | Status: AC
  Administered 2019-10-25: 11:00:00 2.5 mg via RESPIRATORY_TRACT
  Filled 2019-10-25: qty 3

## 2019-10-25 MED ORDER — VYNDAQEL 20 MG PO CAPS: 80.0000 mg | ORAL_CAPSULE | Freq: Every day | ORAL | 3 refills | Status: DC

## 2019-10-25 MED ORDER — BUPIVACAINE HCL (PF) 0.25 % IJ SOLN
INTRAMUSCULAR | Status: DC | PRN
  Administered 2019-10-25: 30 mL

## 2019-10-25 MED ORDER — OXYCODONE HCL 5 MG PO TABS: 5.0000 mg | ORAL_TABLET | Freq: Once | ORAL | Status: DC | PRN

## 2019-10-25 MED ORDER — ALBUTEROL SULFATE (2.5 MG/3ML) 0.083% IN NEBU: 2.5000 mg | INHALATION_SOLUTION | Freq: Four times a day (QID) | RESPIRATORY_TRACT | Status: DC | PRN

## 2019-10-25 MED ORDER — DEXAMETHASONE SODIUM PHOSPHATE 10 MG/ML IJ SOLN
INTRAMUSCULAR | Status: AC
  Filled 2019-10-25: qty 1

## 2019-10-25 MED ORDER — ROCURONIUM BROMIDE 10 MG/ML (PF) SYRINGE
PREFILLED_SYRINGE | INTRAVENOUS | Status: DC | PRN
  Administered 2019-10-25: 50 mg via INTRAVENOUS

## 2019-10-25 MED ORDER — OXYCODONE HCL 5 MG PO TABS: 5.0000 mg | ORAL_TABLET | ORAL | Status: DC | PRN

## 2019-10-25 MED ORDER — ACETAMINOPHEN 10 MG/ML IV SOLN: 1000.0000 mg | Freq: Once | INTRAVENOUS | Status: DC | PRN

## 2019-10-25 MED ORDER — ROCURONIUM BROMIDE 10 MG/ML (PF) SYRINGE
PREFILLED_SYRINGE | INTRAVENOUS | Status: AC
  Filled 2019-10-25: qty 10

## 2019-10-25 SURGICAL SUPPLY — 31 items
BENZOIN TINCTURE PRP APPL 2/3 (GAUZE/BANDAGES/DRESSINGS) ×3 IMPLANT
BINDER ABDOMINAL 12 ML 46-62 (SOFTGOODS) ×3 IMPLANT
CHLORAPREP W/TINT 26 (MISCELLANEOUS) ×3 IMPLANT
CLOSURE WOUND 1/2 X4 (GAUZE/BANDAGES/DRESSINGS)
COVER SURGICAL LIGHT HANDLE (MISCELLANEOUS) ×3 IMPLANT
COVER WAND RF STERILE (DRAPES) IMPLANT
DECANTER SPIKE VIAL GLASS SM (MISCELLANEOUS) ×3 IMPLANT
DRAPE LAPAROSCOPIC ABDOMINAL (DRAPES) ×3 IMPLANT
DRSG TEGADERM 4X4.75 (GAUZE/BANDAGES/DRESSINGS) ×3 IMPLANT
ELECT REM PT RETURN 15FT ADLT (MISCELLANEOUS) ×3 IMPLANT
GAUZE SPONGE 4X4 12PLY STRL (GAUZE/BANDAGES/DRESSINGS) ×3 IMPLANT
GLOVE BIO SURGEON STRL SZ 6 (GLOVE) ×3 IMPLANT
GLOVE INDICATOR 6.5 STRL GRN (GLOVE) ×3 IMPLANT
GOWN STRL REUS W/TWL LRG LVL3 (GOWN DISPOSABLE) ×3 IMPLANT
GOWN STRL REUS W/TWL XL LVL3 (GOWN DISPOSABLE) ×3 IMPLANT
KIT BASIN OR (CUSTOM PROCEDURE TRAY) ×3 IMPLANT
KIT TURNOVER KIT A (KITS) IMPLANT
MESH ULTRAPRO 3X6 7.6X15CM (Mesh General) ×3 IMPLANT
NEEDLE HYPO 22GX1.5 SAFETY (NEEDLE) IMPLANT
PACK GENERAL/GYN (CUSTOM PROCEDURE TRAY) ×3 IMPLANT
PENCIL SMOKE EVACUATOR (MISCELLANEOUS) IMPLANT
STRIP CLOSURE SKIN 1/2X4 (GAUZE/BANDAGES/DRESSINGS) IMPLANT
SUT ETHIBOND 0 MO6 C/R (SUTURE) IMPLANT
SUT MNCRL AB 4-0 PS2 18 (SUTURE) ×3 IMPLANT
SUT PDS AB 1 CT1 27 (SUTURE) ×6 IMPLANT
SUT PROLENE 2 0 CT2 30 (SUTURE) IMPLANT
SUT VIC AB 3-0 SH 27 (SUTURE)
SUT VIC AB 3-0 SH 27XBRD (SUTURE) IMPLANT
SYR CONTROL 10ML LL (SYRINGE) IMPLANT
TOWEL OR 17X26 10 PK STRL BLUE (TOWEL DISPOSABLE) ×3 IMPLANT
TOWEL OR NON WOVEN STRL DISP B (DISPOSABLE) ×3 IMPLANT

## 2019-10-25 NOTE — Progress Notes (Signed)
Pt discharged in NAD, VSS, pain tolerable. Pt discharged home with sister.

## 2019-10-25 NOTE — Discharge Instructions (Signed)
HERNIA REPAIR: POST OP INSTRUCTIONS  EAT Gradually transition to a high fiber diet with a fiber supplement over the next few weeks after discharge.  Start with a pureed / full liquid diet (see below)  WALK Walk an hour a day.  Control your pain to do that.    CONTROL PAIN Control pain so that you can walk, sleep, tolerate sneezing/coughing, and go up/down stairs.  HAVE A BOWEL MOVEMENT DAILY Keep your bowels regular to avoid problems.  OK to try a laxative to override constipation.  OK to use an antidairrheal to slow down diarrhea.  Call if not better after 2 tries  CALL IF YOU HAVE PROBLEMS/CONCERNS Call if you are still struggling despite following these instructions. Call if you have concerns not answered by these instructions   1. DIET: Follow a light bland diet & liquids the first 24 hours after arrival home, such as soup, liquids, starches, etc.  Be sure to drink plenty of fluids.  Quickly advance to a usual solid diet within a few days.  Avoid fast food or heavy meals as your are more likely to get nauseated or have irregular bowels.  A low-sugar, high-fiber diet for the rest of your life is ideal.   2. Take your usually prescribed home medications unless otherwise directed.  3. PAIN CONTROL: a. Pain is best controlled by a usual combination of three different methods TOGETHER: i. Ice/Heat ii. Over the counter pain medication iii. Prescription pain medication b. Most patients will experience some swelling and bruising around the hernia(s) such as the bellybutton, groins, or old incisions.  Ice packs or heating pads (30-60 minutes up to 6 times a day) will help. Use ice for the first few days to help decrease swelling and bruising, then switch to heat to help relax tight/sore spots and speed recovery.  Some people prefer to use ice alone, heat alone, alternating between ice & heat.  Experiment to what works for you.  Swelling and bruising can take several weeks to resolve.    i. Take Acetaminophen (Tylenol, etc) 325-650mg  four times a day (every meal & bedtime) for the first 4 to 5 days after surgery. c. A  prescription for pain medication should be given to you upon discharge.  Take your pain medication as prescribed.  i. If you are having problems/concerns with the prescription medicine (does not control pain, nausea, vomiting, rash, itching, etc), please call us [phone] to see if we need to switch you to a different pain medicine that will work better for you and/or control your side effect better. ii. If you need a refill on your pain medication, please contact your pharmacy.  They will contact our office to request authorization. Prescriptions will not be filled after 5 pm or on week-ends.  4. Avoid getting constipated.  Between the surgery and the pain medications, it is common to experience some constipation.  Increasing fluid intake and taking a fiber supplement (such as Metamucil, Citrucel, FiberCon, MiraLax, etc) 1-2 times a day regularly will usually help prevent this problem from occurring.  A mild laxative (prune juice, Milk of Magnesia, MiraLax, etc) should be taken according to package directions if there are no bowel movements after 48 hours.    5. Wash / shower every day, starting 2 days after surgery.  You may shower over the Steri-Strips as they are waterproof.  Let the soap and water run over the surgical site and pat dry.  No soaking or swimming for at least  2 weeks.  No rubbing, scrubbing, lotions or ointments to the incision until after you followed up in the office.  6. Remove your outer bandage 2 days after surgery.  Steri-Strips will peel off after 1 to 2 weeks.  You may leave the incisions open to air.  You may replace a dressing/Band-Aid to cover an incision for comfort if you wish.  Continue to shower over incision(s) after the dressing is off.  7. Wear your abdominal binder all times (except when showering) for the first week after  surgery.  Starting the second week after surgery, wear the abdominal binder only when you are up and moving around.  8. ACTIVITIES as tolerated:   a. You may resume regular (light) daily activities beginning the next day--such as daily self-care, walking, climbing stairs--gradually increasing activities as tolerated.  Control your pain so that you can walk an hour a day.  If you can walk 30 minutes without difficulty, it is safe to try more intense activity such as jogging, treadmill, bicycling, low-impact aerobics, swimming, etc. b. Refrain from the most intensive and strenuous activity such as sit-ups, heavy lifting, contact sports, etc  Refrain from any heavy lifting or straining until 6 weeks after surgery.   c. DO NOT PUSH THROUGH PAIN.  Let pain be your guide: If it hurts to do something, don't do it.  Pain is your body warning you to avoid that activity for another week until the pain goes down. d. You may drive when you are no longer taking prescription pain medication, you can comfortably wear a seatbelt, and you can safely maneuver your car and apply brakes. e. Dennis Bast may have sexual intercourse when it is comfortable.   9. FOLLOW UP in our office a. Please call [facility] at [phone] to set up an appointment to see your surgeon in the office for a follow-up appointment approximately 2-3 weeks after your surgery. b. Make sure that you call for this appointment the day you arrive home to insure a convenient appointment time.  9.  If you have disability of FMLA / Family leave forms, please bring the forms to the office for processing.  (do not give to your surgeon).  WHEN TO CALL us [phone]: 1. Poor pain control 2. Reactions / problems with new medications (rash/itching, nausea, etc)  3. Fever over 101.5 F (38.5 C) 4. Inability to urinate 5. Nausea and/or vomiting 6. Worsening swelling or bruising 7. Continued bleeding from incision. 8. Increased pain, redness, or drainage from the  incision   The clinic staff is available to answer your questions during regular business hours (8:30am-5pm).  Please don't hesitate to call and ask to speak to one of our nurses for clinical concerns.   If you have a medical emergency, go to the nearest emergency room or call 911.  A surgeon from [facility] is always on call at the hospitals in [facility], [place], [place], [place], [place]  [address] ?  [address] [address], [place], [place]   [address] MAIN: [phone] ? TOLL FREE: [phone] ? FAX: [phone] [email or web address]

## 2019-10-25 NOTE — Transfer of Care (Signed)
Immediate Anesthesia Transfer of Care Note  Patient: Jonathan Dickson  Procedure(s) Performed: Procedure(s): PRIMARY VENTRAL HERNIA REPAIR (N/A)  Patient Location: PACU  Anesthesia Type:General  Level of Consciousness: Alert, Awake, Oriented  Airway & Oxygen Therapy: Patient Spontanous Breathing  Post-op Assessment: Report given to RN  Post vital signs: Reviewed and stable  Last Vitals:  Vitals:   10/25/19 0728  BP: 118/72  Pulse: (!) 101  Resp: 12  Temp: 36.6 C  SpO2: [id number]    Complications: No apparent anesthesia complications

## 2019-10-25 NOTE — Op Note (Signed)
Operative Note  Jonathan Dickson  BB:[id number]  [id number]  10/25/2019   Surgeon: Vikki Ports A ConnorMD  Assistant: none  Procedure performed: open primary repair of incarcerated epigastric hernia  Preop diagnosis: incarcerated ventral hernia Post-op diagnosis/intraop findings: same   Specimens: no Retained items: no EBL: minimal cc Complications: none  Description of procedure: After obtaining informed consent the patient was taken to the operating room and placed supine on operating room table wheregeneral endotracheal anesthesia was initiated, preoperative antibiotics were administered, SCDs applied, and a formal timeout was performed.  The abdomen was prepped and draped in usual sterile fashion.  After infiltration with local, a small vertical incision was made over the region of the chronically incarcerated epigastric hernia and the soft tissues dissected with cautery until the hernia sac was encountered.  This was skeletonized down to the fascia and the hernia sac was excised.  It was noted to contain chronically incarcerated but viable omentum, which was able to be carefully reduced into the abdominal cavity.  The hernia defect was 1 cm in maximal dimension and more transversely oriented.  This was closed transversely with interrupted 0 Ethibonds.  Additional local was infiltrated in the fascia and subcutaneous space.  Hemostasis was excellent.  The deeper tissues were reapproximated with interrupted 3-0 Vicryls.  Deep dermal 3-0 Vicryl and running subcuticular Monocryl were then used to close the skin.  Benzoin, Steri-Strips, and a pressure dressing of 4 x 4's and Tegaderm were applied.  Abdominal binder will be applied.  The patient was then awakened, extubated and taken to PACU in stable condition.   All counts were correct at the completion of the case.

## 2019-10-25 NOTE — Anesthesia Procedure Notes (Signed)
Procedure Name: Intubation Date/Time: 10/25/2019 10:02 AM Performed by: Gerald Leitz, CRNA Pre-anesthesia Checklist: Patient identified, Patient being monitored, Timeout performed, Emergency Drugs available and Suction available Patient Re-evaluated:Patient Re-evaluated prior to induction Oxygen Delivery Method: Circle system utilized Preoxygenation: Pre-oxygenation with 100% oxygen Induction Type: IV induction Ventilation: Mask ventilation without difficulty Laryngoscope Size: Mac and 3 Grade View: Grade I Tube type: Oral Tube size: 7.5 mm Number of attempts: 1 Placement Confirmation: ETT inserted through vocal cords under direct vision,  positive ETCO2 and breath sounds checked- equal and bilateral Secured at: 21 cm Tube secured with: Tape Dental Injury: Teeth and Oropharynx as per pre-operative assessment

## 2019-10-25 NOTE — Anesthesia Preprocedure Evaluation (Addendum)
Anesthesia Evaluation  Patient identified by MRN, date of birth, ID band Patient awake    Reviewed: Allergy & Precautions, NPO status , Patient's Chart, lab work & pertinent test results  Airway Mallampati: II  TM Distance: >3 FB Neck ROM: Full    Dental  (+) Dental Advisory Given, Edentulous Upper, Edentulous Lower   Pulmonary shortness of breath, neg sleep apnea, neg recent URI, former smoker,  S/p covid   breath sounds clear to auscultation       Cardiovascular hypertension, Pt. on medications (-) angina+ Past MI and +CHF  + Cardiac Defibrillator  Rhythm:Regular   1. Left ventricular ejection fraction, by visual estimation, is 30 to 35%. The left ventricle has moderately decreased function. There is no left ventricular hypertrophy.  2. Definity contrast agent was given IV to delineate the left ventricular endocardial borders.  3. Left ventricular diastolic parameters are consistent with Grade I diastolic dysfunction (impaired relaxation).  4. Global right ventricle has normal systolic function.The right ventricular size is normal. No increase in right ventricular wall thickness.  5. Left atrial size was normal.  6. Right atrial size was normal.  7. The mitral valve is normal in structure. Trace mitral valve regurgitation. No evidence of mitral stenosis.  8. The tricuspid valve is normal in structure. Tricuspid valve regurgitation is trivial.  9. The aortic valve is tricuspid. Aortic valve regurgitation is not visualized. Mild to moderate aortic valve sclerosis/calcification without any evidence of aortic stenosis. 10. The pulmonic valve was normal in structure. Pulmonic valve regurgitation is not visualized. 11. Normal pulmonary artery systolic pressure. 12. A pacer wire is visualized. 13. The inferior vena cava is normal in size with greater than 50% respiratory variability, suggesting right atrial pressure of 3 mmHg.   St Jude  ICD   Neuro/Psych Seizures -,  PSYCHIATRIC DISORDERS Anxiety Depression  Neuromuscular disease    GI/Hepatic   Endo/Other  diabetes  Renal/GU Renal InsufficiencyRenal disease     Musculoskeletal   Abdominal   Peds  Hematology negative hematology ROS (+)   Anesthesia Other Findings   Reproductive/Obstetrics                            Anesthesia Physical Anesthesia Plan  ASA: III  Anesthesia Plan: General   Post-op Pain Management:    Induction: Intravenous  PONV Risk Score and Plan: 2 and Ondansetron and Dexamethasone  Airway Management Planned: Oral ETT  Additional Equipment: None  Intra-op Plan:   Post-operative Plan: Extubation in OR and Possible Post-op intubation/ventilation  Informed Consent: I have reviewed the patients History and Physical, chart, labs and discussed the procedure including the risks, benefits and alternatives for the proposed anesthesia with the patient or authorized representative who has indicated his/her understanding and acceptance.     Dental advisory given  Plan Discussed with: CRNA and Surgeon  Anesthesia Plan Comments:         Anesthesia Quick Evaluation

## 2019-10-25 NOTE — Interval H&P Note (Signed)
History and Physical Interval Note:  10/25/2019 9:16 AM  [name]  has presented today for surgery, with the diagnosis of HERNIA INCARCERATED.  The various methods of treatment have been discussed with the patient and family. After consideration of risks, benefits and other options for treatment, the patient has consented to  Procedure(s): HERNIA REPAIR VENTRAL WITH MESH (N/A) as a surgical intervention.  The patient's history has been reviewed, patient examined, no change in status, stable for surgery.  I have reviewed the patient's chart and labs.  Questions were answered to the patient's satisfaction.     Kacey Dysert Rich Brave

## 2019-10-26 ENCOUNTER — Telehealth (HOSPITAL_COMMUNITY): Payer: Self-pay | Admitting: Pharmacist

## 2019-10-26 ENCOUNTER — Telehealth (HOSPITAL_COMMUNITY): Payer: Self-pay | Admitting: Pharmacy Technician

## 2019-10-26 MED ORDER — VYNDAMAX 61 MG PO CAPS: 61.0000 mg | ORAL_CAPSULE | Freq: Every day | ORAL | 11 refills | Status: DC

## 2019-10-26 NOTE — Telephone Encounter (Signed)
Vyndaqel changed to Vyndamax per insurance and patient preference. Will send new prescription to [facility].  Audry Riles, PharmD, BCPS, BCCP, CPP Heart Failure Clinic Pharmacist [phone]

## 2019-10-26 NOTE — Telephone Encounter (Signed)
Was able to enroll patient in [facility]. He should receive his first shipment of Vyndamax by Friday. Patient is aware that we will discontinue Vyndaqel and start Vyndamax at one capsule a day once received. They will call him monthly to set up shipping.  Advised him to call me with any issues.   Charlann Boxer, CPhT

## 2019-10-27 MED FILL — VYNDAMAX 61 MG CAPS: 61 | 30 days supply | Qty: 30 | Fill #0

## 2019-10-27 NOTE — Anesthesia Postprocedure Evaluation (Signed)
Anesthesia Post Note  Patient: Jonathan Dickson  Procedure(s) Performed: PRIMARY VENTRAL HERNIA REPAIR (N/A Abdomen)     Patient location during evaluation: PACU Anesthesia Type: General Level of consciousness: awake and alert Pain management: pain level controlled Vital Signs Assessment: post-procedure vital signs reviewed and stable Respiratory status: spontaneous breathing, nonlabored ventilation, respiratory function stable and patient connected to nasal cannula oxygen Cardiovascular status: blood pressure returned to baseline and stable Postop Assessment: no apparent nausea or vomiting Anesthetic complications: no    Last Vitals:  Vitals:   10/25/19 1200 10/25/19 1245  BP: 130/88 127/88  Pulse: 94 96  Resp: 19 16  Temp:    SpO2: 98% 93%    Last Pain:  Vitals:   10/25/19 1245  TempSrc:   PainSc: 0-No pain                 Vida Nicol

## 2019-10-29 ENCOUNTER — Encounter: Payer: Self-pay | Admitting: *Deleted

## 2019-11-01 ENCOUNTER — Ambulatory Visit (INDEPENDENT_AMBULATORY_CARE_PROVIDER_SITE_OTHER): Payer: Medicaid Other | Admitting: Pharmacist

## 2019-11-01 ENCOUNTER — Other Ambulatory Visit: Payer: Self-pay

## 2019-11-01 DIAGNOSIS — I2699 Other pulmonary embolism without acute cor pulmonale: Secondary | ICD-10-CM | POA: Diagnosis not present

## 2019-11-01 DIAGNOSIS — Z7901 Long term (current) use of anticoagulants: Secondary | ICD-10-CM | POA: Diagnosis not present

## 2019-11-01 DIAGNOSIS — I824Y9 Acute embolism and thrombosis of unspecified deep veins of unspecified proximal lower extremity: Secondary | ICD-10-CM | POA: Diagnosis not present

## 2019-11-01 LAB — POCT INR: INR: 1.6 — AB (ref 2.0–3.0)

## 2019-11-03 ENCOUNTER — Encounter (HOSPITAL_COMMUNITY)
Admission: RE | Admit: 2019-11-03 | Discharge: 2019-11-03 | Disposition: A | Discharge status: Home / Self Care | Payer: Medicaid Other | Source: Ambulatory Visit | Attending: Internal Medicine | Admitting: Internal Medicine

## 2019-11-03 ENCOUNTER — Other Ambulatory Visit: Payer: Self-pay

## 2019-11-03 DIAGNOSIS — I5022 Chronic systolic (congestive) heart failure: Secondary | ICD-10-CM | POA: Insufficient documentation

## 2019-11-03 MED ORDER — TECHNETIUM TC 99M PYROPHOSPHATE
21.2000 | Freq: Once | INTRAVENOUS | Status: AC | PRN
  Administered 2019-11-03: 21.2 via INTRAVENOUS
  Filled 2019-11-03: qty 22

## 2019-11-04 ENCOUNTER — Ambulatory Visit: Payer: Medicaid Other | Attending: Internal Medicine | Admitting: Internal Medicine

## 2019-11-04 ENCOUNTER — Encounter: Payer: Self-pay | Admitting: Internal Medicine

## 2019-11-04 VITALS — BP 127/82 | HR 93 | Temp 97.8°F | Resp 16 | Wt 176.4 lb

## 2019-11-04 DIAGNOSIS — Z7982 Long term (current) use of aspirin: Secondary | ICD-10-CM | POA: Diagnosis not present

## 2019-11-04 DIAGNOSIS — Z86711 Personal history of pulmonary embolism: Secondary | ICD-10-CM | POA: Insufficient documentation

## 2019-11-04 DIAGNOSIS — I420 Dilated cardiomyopathy: Secondary | ICD-10-CM | POA: Diagnosis not present

## 2019-11-04 DIAGNOSIS — Z87891 Personal history of nicotine dependence: Secondary | ICD-10-CM | POA: Diagnosis not present

## 2019-11-04 DIAGNOSIS — Z7984 Long term (current) use of oral hypoglycemic drugs: Secondary | ICD-10-CM | POA: Insufficient documentation

## 2019-11-04 DIAGNOSIS — R062 Wheezing: Secondary | ICD-10-CM | POA: Diagnosis not present

## 2019-11-04 DIAGNOSIS — I824Y2 Acute embolism and thrombosis of unspecified deep veins of left proximal lower extremity: Secondary | ICD-10-CM

## 2019-11-04 DIAGNOSIS — Z79899 Other long term (current) drug therapy: Secondary | ICD-10-CM | POA: Insufficient documentation

## 2019-11-04 DIAGNOSIS — I5022 Chronic systolic (congestive) heart failure: Secondary | ICD-10-CM | POA: Insufficient documentation

## 2019-11-04 DIAGNOSIS — I10 Essential (primary) hypertension: Secondary | ICD-10-CM

## 2019-11-04 DIAGNOSIS — E1151 Type 2 diabetes mellitus with diabetic peripheral angiopathy without gangrene: Secondary | ICD-10-CM | POA: Insufficient documentation

## 2019-11-04 DIAGNOSIS — E785 Hyperlipidemia, unspecified: Secondary | ICD-10-CM | POA: Insufficient documentation

## 2019-11-04 DIAGNOSIS — Z86718 Personal history of other venous thrombosis and embolism: Secondary | ICD-10-CM | POA: Insufficient documentation

## 2019-11-04 DIAGNOSIS — Z8616 Personal history of COVID-19: Secondary | ICD-10-CM | POA: Insufficient documentation

## 2019-11-04 DIAGNOSIS — R05 Cough: Secondary | ICD-10-CM | POA: Diagnosis not present

## 2019-11-04 DIAGNOSIS — R0609 Other forms of dyspnea: Secondary | ICD-10-CM

## 2019-11-04 DIAGNOSIS — I110 Hypertensive heart disease with heart failure: Secondary | ICD-10-CM | POA: Insufficient documentation

## 2019-11-04 DIAGNOSIS — Z7901 Long term (current) use of anticoagulants: Secondary | ICD-10-CM | POA: Insufficient documentation

## 2019-11-04 DIAGNOSIS — E119 Type 2 diabetes mellitus without complications: Secondary | ICD-10-CM

## 2019-11-04 DIAGNOSIS — I428 Other cardiomyopathies: Secondary | ICD-10-CM

## 2019-11-04 DIAGNOSIS — R0600 Dyspnea, unspecified: Secondary | ICD-10-CM

## 2019-11-04 LAB — GLUCOSE, POCT (MANUAL RESULT ENTRY): POC Glucose: 133 mg/dl — AB (ref 70–99)

## 2019-11-04 MED ORDER — ALBUTEROL SULFATE HFA 108 (90 BASE) MCG/ACT IN AERS: 2.0000 | INHALATION_SPRAY | Freq: Four times a day (QID) | RESPIRATORY_TRACT | 2 refills | Status: DC | PRN

## 2019-11-04 NOTE — Patient Instructions (Signed)
I have sent a prescription to your pharmacy for albuterol inhaler for you to use as needed.  Please go to [facility] radiology department to have the x-ray of the chest done.  Please let Dr. Alvester Chou know about the shortness of breath on exertion that you have been having.

## 2019-11-04 NOTE — Progress Notes (Signed)
Pt states he is having trouble breathing when he does a lot of walking

## 2019-11-04 NOTE — Progress Notes (Signed)
Patient ID: Jonathan Dickson, male    DOB: [birth year]  MRN: [id number]  CC: Hospitalization Follow-up   Subjective: Jonathan Dickson is a 64 y.o. male who presents for chronic ds management His concerns today include:  Pt with hx of NICM, chronic systolic CHF with recent EF of 25-30%, ICD,, cardiac amyloidosis HTN, preDM,BL PE/DVT LLE  (plan for lifelong anticoagulation), insomnia  History of bilateral PE/DVT LLE: Patient last saw me 05/2019.  Since then he was diagnosed with DVT in the left lower extremity in the setting of Covid.  DOAC was changed to Coumadin.  His Coumadin level is being followed and check through his cardiologist Dr. [name]. -Denies any bruising or bleeding on Coumadin. -He did see Dr. Marylyn Ishihara 06/2019.  He recommends long-term anticoagulation  Patient hospitalized 12/24-20 03/2019 with acute respiratory failure with hypoxia secondary to Covid pneumonia.  Had CT angiogram which was positive for peripheral opacities in the lungs.  Started on remdesivir.  Steroid not given as it was listed as an allergy.  Patient left the hospital AMA before treatment was completed. -Today main complaint is dyspnea on exertion which he states he has had for years.  He does not feel it is any worse since Covid pneumonia -Endorses some PND.  Endorses nocturnal cough and sometimes wheezing at nights for the past few months. No lower extremity edema. Former smoker.  Quit 17 years ago.  Quit EtOH use 17 years ago also. -Last echo done 08/2019 revealed EF of 30 to 35% which is improved from previous.  Also showed grade 1 diastolic dysfunction. -Reports compliance with his heart medications.  Has follow-up appointment with Dr. [name] later this month  HTN: Reports compliance with medications and low-salt diet.  He checks blood pressure about 3 times a week and states that it is doing okay.  Denies any chest pains.  DM: Checks blood sugars about 3 times a week.  Range has been 120-140.  Most recent A1c was 7.1  on 09/24/2019.  Reports compliance with Metformin.  He is up-to-date with his eye exam.  Had it done 5 months ago by Dr. Schuyler Amor. Admits that he can do better with his eating habits.  He lives to drink [employer].  Had incarcerated abdominal hernia repair the end of last month.  Reports that he is doing well.  He is moving his bowels.  Denies any nausea or vomiting.   Patient Active Problem List   Diagnosis Date Noted  . Acute respiratory failure with hypoxia ([place]) 09/24/2019  . Long term (current) use of anticoagulants 08/06/2019  . DVT (deep venous thrombosis) ([place]) 08/04/2019  . Hypertensive retinopathy 06/30/2019  . Pain due to onychomycosis of toenails of both feet 05/28/2019  . Hyperlipidemia associated with type 2 diabetes mellitus ([place]) 05/15/2019  . Type 2 diabetes mellitus without complication, without long-term current use of insulin ([place]) 05/13/2019  . Cardiac amyloidosis ([place]) 07/21/2018  . Chronic systolic heart failure ([place]) 06/17/2018  . Bilateral pulmonary embolism ([place]) 05/12/2018  . Heart failure ([place]) 05/11/2018  . Claudication in peripheral vascular disease ([place]) 12/31/2017  . Decreased hearing of both ears 11/10/2017  . Cervical disc disease with myelopathy 07/10/2016  . NICM (nonischemic cardiomyopathy) ([place]) 03/12/2016  . Congestive dilated cardiomyopathy ([place]) 03/11/2016  . Cervical radiculitis 04/19/2015  . Essential hypertension 09/27/2014     Current Outpatient Medications on File Prior to Visit  Medication Sig Dispense Refill  . acetaminophen (TYLENOL) 500 MG tablet Take 1,000 mg by mouth every 6 (  six) hours as needed for moderate pain or headache.    Marland Kitchen amitriptyline (ELAVIL) 25 MG tablet TAKE 1 TABLET(25 MG) BY MOUTH AT BEDTIME AS NEEDED FOR SLEEP (Patient taking differently: Take 25 mg by mouth at bedtime as needed for sleep. ) 30 tablet 0  . Aromatic Inhalants (VICKS VAPOINHALER) INHA Inhale 1 Dose into the lungs daily as needed (congestion).    Marland Kitchen  atorvastatin (LIPITOR) 20 MG tablet Take 1 tablet (20 mg total) by mouth daily. 90 tablet 3  . carvedilol (COREG) 25 MG tablet Take 1 tablet (25 mg total) by mouth 2 (two) times daily with a meal. 180 tablet 3  . Chlorphen-Phenyleph-ASA (ALKA-SELTZER PLUS COLD PO) Take 1-2 tablets by mouth 2 (two) times daily as needed (cold symptoms).     . Cholecalciferol (VITAMIN D) 50 MCG (2000 UT) tablet Take 2,000 Units by mouth daily.    Marland Kitchen docusate sodium (COLACE) 100 MG capsule Take 1 capsule (100 mg total) by mouth 2 (two) times daily. 60 capsule 0  . enoxaparin (LOVENOX) 80 MG/0.8ML injection Inject 80 mg subcutaneously every 12 hours as directed 12.8 mL 0  . furosemide (LASIX) 40 MG tablet Take 1 tablet (40 mg total) by mouth daily as needed for fluid (if weight gain more than 3 punds). 30 tablet 0  . hydrALAZINE (APRESOLINE) 50 MG tablet TAKE 1 TABLET(50 MG) BY MOUTH THREE TIMES DAILY. FOLLOW UP APPOINTMENT PLEASE (Patient taking differently: Take 50 mg by mouth 3 (three) times daily. ) 90 tablet 3  . isosorbide mononitrate (IMDUR) 30 MG 24 hr tablet Take 1 tablet (30 mg total) by mouth at bedtime. 90 tablet 3  . metFORMIN (GLUCOPHAGE) 500 MG tablet Take 1 tablet (500 mg total) by mouth daily with breakfast. 90 tablet 3  . methocarbamol (ROBAXIN) 500 MG tablet Take 1 tablet (500 mg total) by mouth every 8 (eight) hours as needed for muscle spasms. 15 tablet 0  . oxyCODONE (ROXICODONE) 5 MG immediate release tablet Take 1 tablet (5 mg total) by mouth every 8 (eight) hours as needed. 20 tablet 0  . pseudoephedrine (SUDAFED) 30 MG tablet Take 30 mg by mouth every 4 (four) hours as needed for congestion.    . sacubitril-valsartan (ENTRESTO) 97-103 MG Take 1 tablet by mouth 2 (two) times daily. 60 tablet 6  . sildenafil (VIAGRA) 50 MG tablet TAKE 1 TABLET BY MOUTH EVERY DAIY AS NEEDED FOR ERECTILE DYSFUNCTION (Patient taking differently: Take 50 mg by mouth daily as needed for erectile dysfunction. ) 10 tablet 2   . spironolactone (ALDACTONE) 25 MG tablet Take 0.5 tablets (12.5 mg total) by mouth daily. 15 tablet 11  . Tafamidis (VYNDAMAX) 61 MG CAPS Take 61 mg by mouth daily. 30 capsule 11  . warfarin (COUMADIN) 5 MG tablet Take 1 to 2 tablets by mouth daily as directed (Patient taking differently: Take 7.5 mg by mouth daily. ) 45 tablet 3   No current facility-administered medications on file prior to visit.    Allergies  Allergen Reactions  . Peanut-Containing Drug Products Swelling  . Penicillins Other (See Comments)    CONVULSIONS Did it involve swelling of the face/tongue/throat, SOB, or low BP? No Did it involve sudden or severe rash/hives, skin peeling, or any reaction on the inside of your mouth or nose? No Did you need to seek medical attention at a hospital or doctor's office? Yes When did it last happen?25 years If all above answers are "NO", may proceed with cephalosporin use.   Marland Kitchen  Decadron [Dexamethasone] Itching    Social History   Socioeconomic History  . Marital status: Legally Separated    Spouse name: Not on file  . Number of children: 3  . Years of education: Not on file  . Highest education level: Not on file  Occupational History  . Not on file  Tobacco Use  . Smoking status: Former Smoker    Packs/day: 1.50    Years: 8.00    Pack years: 12.00    Quit date: 03/15/2003    Years since quitting: 16.6  . Smokeless tobacco: Never Used  Substance and Sexual Activity  . Alcohol use: No    Comment: none since 2004  . Drug use: No    Comment: former  none since 2004  . Sexual activity: Not Currently  Other Topics Concern  . Not on file  Social History Narrative  . Not on file   Social Determinants of Health   Financial Resource Strain:   . Difficulty of Paying Living Expenses: Not on file  Food Insecurity:   . Worried About Charity fundraiser in the Last Year: Not on file  . Ran Out of Food in the Last Year: Not on file  Transportation Needs:   .  Lack of Transportation (Medical): Not on file  . Lack of Transportation (Non-Medical): Not on file  Physical Activity:   . Days of Exercise per Week: Not on file  . Minutes of Exercise per Session: Not on file  Stress:   . Feeling of Stress : Not on file  Social Connections:   . Frequency of Communication with Friends and Family: Not on file  . Frequency of Social Gatherings with Friends and Family: Not on file  . Attends Religious Services: Not on file  . Active Member of Clubs or Organizations: Not on file  . Attends Archivist Meetings: Not on file  . Marital Status: Not on file  Intimate Partner Violence:   . Fear of Current or Ex-Partner: Not on file  . Emotionally Abused: Not on file  . Physically Abused: Not on file  . Sexually Abused: Not on file    Family History  Problem Relation Age of Onset  . Diabetes Mother   . Diabetes Sister     Past Surgical History:  Procedure Laterality Date  . ANTERIOR CERVICAL DECOMP/DISCECTOMY FUSION N/A 07/10/2016   Procedure: ANTERIOR CERVICAL DECOMPRESSION FUSION CERVICAL 4-5, CERVICAL 5-6, CERVICAL 6-7 WITH INSTRUMENTATION AND ALLOGRAFT;  Surgeon: [name], MD;  Location: [facility];  Service: Orthopedics;  Laterality: N/A;  . CARDIAC CATHETERIZATION N/A 03/11/2016   Procedure: Left Heart Cath and Coronary Angiography;  Surgeon: [name], MD;  Location: [facility];  Service: Cardiovascular;  Laterality: N/A;  . ICD IMPLANT N/A 06/17/2018   Procedure: ICD IMPLANT;  Surgeon: [name], MD;  Location: [facility];  Service: Cardiovascular;  Laterality: N/A;  . Thumb surgery Right   . VENTRAL HERNIA REPAIR N/A 10/25/2019   Procedure: PRIMARY VENTRAL HERNIA REPAIR;  Surgeon: [name], MD;  Location: [facility];  Service: General;  Laterality: N/A;    ROS: Review of Systems Negative except as stated above  PHYSICAL EXAM: BP 127/82   Pulse 93   Temp 97.8 F (36.6 C)   Resp 16   Wt 176 lb  6.4 oz (80 kg)   SpO2 99%   BMI 28.47 kg/m   Physical Exam Patient walked around the nurses station.  Pulse ox during ambulation stayed at 99.  He did not appear dyspneic post ambulation  General appearance - alert, well appearing, and in no distress Mental status - normal mood, behavior, speech, dress, motor activity, and thought processes Eyes - pupils equal and reactive, extraocular eye movements intact Neck - supple, no significant adenopathy Chest - clear to auscultation, no wheezes, rales or rhonchi, symmetric air entry Heart - normal rate, regular rhythm, normal S1, S2, no murmurs, rubs, clicks or gallops.  No JVD Abdomen -patient has mild ecchymosis on the right mid abdomen.  He attributes this to Lovenox injections that he was giving himself.  Midline incision with Steri-Strips noted.  No signs of wound dehiscence. Extremities -no lower extremity edema  CMP Latest Ref Rng & Units 10/21/2019 09/25/2019 09/23/2019  Glucose 70 - 99 mg/dL 98 113(H) 120(H)  BUN 8 - 23 mg/dL 17 21 26(H)  Creatinine 0.61 - 1.24 mg/dL 1.44(H) 0.93 1.31(H)  Sodium 135 - 145 mmol/L 135 134(L) 134(L)  Potassium 3.5 - 5.1 mmol/L 5.6(H) 4.2 4.5  Chloride 98 - 111 mmol/L 104 104 104  CO2 22 - 32 mmol/L 18(L) 18(L) 19(L)  Calcium 8.9 - 10.3 mg/dL 9.4 8.4(L) 8.9  Total Protein 6.5 - 8.1 g/dL - 6.4(L) 6.9  Total Bilirubin 0.3 - 1.2 mg/dL - 0.3 0.8  Alkaline Phos 38 - 126 U/L - 75 80  AST 15 - 41 U/L - 45(H) 58(H)  ALT 0 - 44 U/L - 71(H) 92(H)   Lipid Panel     Component Value Date/Time   CHOL 230 (H) 05/13/2019 1014   TRIG 160 (H) 09/24/2019 0134   HDL 39 (L) 05/13/2019 1014   CHOLHDL 5.9 (H) 05/13/2019 1014   CHOLHDL 2.9 10/04/2014 1508   VLDL 17 10/04/2014 1508   LDLCALC 131 (H) 05/13/2019 1014    CBC    Component Value Date/Time   WBC 7.0 10/21/2019 1343   RBC 4.62 10/21/2019 1343   HGB 13.1 10/21/2019 1343   HGB 13.8 02/19/2017 1207   HCT 41.2 10/21/2019 1343   HCT 41.9 02/19/2017 1207     PLT 229 10/21/2019 1343   PLT 200 02/19/2017 1207   MCV 89.2 10/21/2019 1343   MCV 87 02/19/2017 1207   MCH 28.4 10/21/2019 1343   MCHC 31.8 10/21/2019 1343   RDW 15.7 (H) 10/21/2019 1343   RDW 14.8 02/19/2017 1207   LYMPHSABS 1.5 09/25/2019 0331   LYMPHSABS 2.3 02/19/2017 1207   MONOABS 0.4 09/25/2019 0331   EOSABS 0.1 09/25/2019 0331   EOSABS 0.2 02/19/2017 1207   BASOSABS 0.0 09/25/2019 0331   BASOSABS 0.0 02/19/2017 1207    ASSESSMENT AND PLAN: 1. Type 2 diabetes mellitus without complication, without long-term current use of insulin ([place]) Patient to continue Metformin. Dietary counseling given.  Advised to eliminate sugary drinks from the diet, cut back on white carbohydrates, eat more white meat than red meat and incorporate fresh fruits and vegetables into the diet - POCT glucose (manual entry) - Comprehensive metabolic panel  2. DOE (dyspnea on exertion) -May be due to his history of CHF +/- recent Covid pneumonia.  We will get a chest x-ray to see if there is any signs of lingering infiltrate.  We will also check BNP. -Patient gives history of some wheezing on cough at nights so I will also try him with albuterol inhaler to use as needed.  Advised to have clinical pharmacist teach him technique of use of the inhaler - DG Chest 2  View; Future  3. Deep vein thrombosis (DVT) of proximal vein of left lower extremity, unspecified chronicity (HCC) Patient with history of bilateral PE last year now with recent DVT.  Failed DOAC.  Will be on Coumadin lifelong.  4. Essential hypertension Close to goal.  Continue current medications and low-salt diet  5. Chronic systolic CHF (congestive heart failure) (HCC) Compensated.  Continue current medications and follow-up with his cardiologist next week - Brain natriuretic peptide - Comprehensive metabolic panel  6. NICM (nonischemic cardiomyopathy) ([place]) See #5 above - Brain natriuretic peptide  7. Wheezing See #2 above - DG  Chest 2 View; Future - albuterol (VENTOLIN HFA) 108 (90 Base) MCG/ACT inhaler; Inhale 2 puffs into the lungs every 6 (six) hours as needed for wheezing or shortness of breath.  Dispense: 8 g; Refill: 2    Patient was given the opportunity to ask questions.  Patient verbalized understanding of the plan and was able to repeat key elements of the plan.   Orders Placed This Encounter  Procedures  . POCT glucose (manual entry)     Requested Prescriptions    No prescriptions requested or ordered in this encounter    No follow-ups on file.  Karle Plumber, MD, FACP

## 2019-11-05 ENCOUNTER — Ambulatory Visit (HOSPITAL_COMMUNITY)
Admission: RE | Admit: 2019-11-05 | Discharge: 2019-11-05 | Disposition: A | Discharge status: Home / Self Care | Payer: Medicaid Other | Source: Ambulatory Visit | Attending: Internal Medicine | Admitting: Internal Medicine

## 2019-11-05 ENCOUNTER — Ambulatory Visit: Payer: Self-pay | Admitting: Cardiovascular Disease

## 2019-11-05 ENCOUNTER — Other Ambulatory Visit: Payer: Self-pay

## 2019-11-05 ENCOUNTER — Ambulatory Visit: Payer: Self-pay | Admitting: Podiatry

## 2019-11-05 ENCOUNTER — Encounter: Payer: Self-pay | Admitting: Podiatry

## 2019-11-05 DIAGNOSIS — R062 Wheezing: Secondary | ICD-10-CM | POA: Diagnosis present

## 2019-11-05 DIAGNOSIS — R0600 Dyspnea, unspecified: Secondary | ICD-10-CM | POA: Diagnosis not present

## 2019-11-05 DIAGNOSIS — M79675 Pain in left toe(s): Secondary | ICD-10-CM

## 2019-11-05 DIAGNOSIS — B351 Tinea unguium: Secondary | ICD-10-CM

## 2019-11-05 DIAGNOSIS — E119 Type 2 diabetes mellitus without complications: Secondary | ICD-10-CM

## 2019-11-05 DIAGNOSIS — M79674 Pain in right toe(s): Secondary | ICD-10-CM

## 2019-11-05 DIAGNOSIS — R0609 Other forms of dyspnea: Secondary | ICD-10-CM

## 2019-11-05 LAB — COMPREHENSIVE METABOLIC PANEL
ALT: 25 IU/L (ref 0–44)
AST: 21 IU/L (ref 0–40)
Albumin/Globulin Ratio: 1.6 (ref 1.2–2.2)
Albumin: 4.1 g/dL (ref 3.8–4.8)
Alkaline Phosphatase: 72 IU/L (ref 39–117)
BUN/Creatinine Ratio: 12 (ref 10–24)
BUN: 12 mg/dL (ref 8–27)
Bilirubin Total: 0.2 mg/dL (ref 0.0–1.2)
CO2: 17 mmol/L — ABNORMAL LOW (ref 20–29)
Calcium: 9.8 mg/dL (ref 8.6–10.2)
Chloride: 105 mmol/L (ref 96–106)
Creatinine, Ser: 0.98 mg/dL (ref 0.76–1.27)
GFR calc Af Amer: 94 mL/min/{1.73_m2} (ref >59)
GFR calc non Af Amer: 81 mL/min/{1.73_m2} (ref >59)
Globulin, Total: 2.6 g/dL (ref 1.5–4.5)
Glucose: 81 mg/dL (ref 65–99)
Potassium: 4.7 mmol/L (ref 3.5–5.2)
Sodium: 139 mmol/L (ref 134–144)
Total Protein: 6.7 g/dL (ref 6.0–8.5)

## 2019-11-05 LAB — BRAIN NATRIURETIC PEPTIDE: BNP: 64.6 pg/mL (ref 0.0–100.0)

## 2019-11-05 NOTE — Progress Notes (Signed)
Complaint:  Visit Type: Patient returns to my office for continued preventative foot care services. Complaint: Patient states" my nails have grown long and thick and become painful to walk and wear shoes" Patient has been diagnosed with DM with no foot complications. The patient presents for preventative foot care services. Patient is taking eliquiss.  Podiatric Exam: Vascular: dorsalis pedis and posterior tibial pulses are palpable bilateral. Capillary return is immediate. Temperature gradient is WNL. Skin turgor WNL  Sensorium: Normal Semmes Weinstein monofilament test. Normal tactile sensation bilaterally. Nail Exam: Pt has thick disfigured discolored nails with subungual debris noted bilateral entire nail hallux through fifth toenails Ulcer Exam: There is no evidence of ulcer or pre-ulcerative changes or infection. Orthopedic Exam: Muscle tone and strength are WNL. No limitations in general ROM. No crepitus or effusions noted. Foot type and digits show no abnormalities. Bony prominences are unremarkable. Skin: No Porokeratosis. No infection or ulcers  Diagnosis:  Onychomycosis, , Pain in right toe, pain in left toes  Treatment & Plan Procedures and Treatment: Consent by patient was obtained for treatment procedures.   Debridement of mycotic and hypertrophic toenails, 1 through 5 bilateral and clearing of subungual debris. No ulceration, no infection noted.  Return Visit-Office Procedure: Patient instructed to return to the office for a follow up visit 4 months for continued evaluation and treatment.    Gardiner Barefoot DPM

## 2019-11-09 ENCOUNTER — Telehealth: Payer: Self-pay

## 2019-11-09 NOTE — Telephone Encounter (Signed)
Contacted pt to go over lab/xray results pt is aware and doesn't have any questions or concerns

## 2019-11-10 ENCOUNTER — Other Ambulatory Visit (HOSPITAL_COMMUNITY): Payer: Self-pay

## 2019-11-10 MED ORDER — SACUBITRIL-VALSARTAN 97-103 MG PO TABS: 1.0000 | ORAL_TABLET | Freq: Two times a day (BID) | ORAL | 6 refills | Status: DC

## 2019-11-16 ENCOUNTER — Ambulatory Visit (INDEPENDENT_AMBULATORY_CARE_PROVIDER_SITE_OTHER): Payer: Medicaid Other | Admitting: Pharmacist Clinician (PhC)/ Clinical Pharmacy Specialist

## 2019-11-16 ENCOUNTER — Other Ambulatory Visit: Payer: Self-pay

## 2019-11-16 ENCOUNTER — Ambulatory Visit (INDEPENDENT_AMBULATORY_CARE_PROVIDER_SITE_OTHER): Payer: Medicaid Other | Admitting: Cardiovascular Disease

## 2019-11-16 ENCOUNTER — Encounter: Payer: Self-pay | Admitting: Cardiovascular Disease

## 2019-11-16 VITALS — BP 94/62 | HR 91 | Ht 66.0 in | Wt 177.0 lb

## 2019-11-16 DIAGNOSIS — E785 Hyperlipidemia, unspecified: Secondary | ICD-10-CM

## 2019-11-16 DIAGNOSIS — I420 Dilated cardiomyopathy: Secondary | ICD-10-CM | POA: Diagnosis not present

## 2019-11-16 DIAGNOSIS — I2699 Other pulmonary embolism without acute cor pulmonale: Secondary | ICD-10-CM

## 2019-11-16 DIAGNOSIS — I5042 Chronic combined systolic (congestive) and diastolic (congestive) heart failure: Secondary | ICD-10-CM

## 2019-11-16 DIAGNOSIS — Z7901 Long term (current) use of anticoagulants: Secondary | ICD-10-CM

## 2019-11-16 DIAGNOSIS — E1169 Type 2 diabetes mellitus with other specified complication: Secondary | ICD-10-CM

## 2019-11-16 LAB — POCT INR: INR: 2.4 (ref 2.0–3.0)

## 2019-11-16 MED ORDER — ATORVASTATIN CALCIUM 40 MG PO TABS: 40.0000 mg | ORAL_TABLET | Freq: Every day | ORAL | 3 refills | Status: DC

## 2019-11-16 NOTE — Patient Instructions (Signed)
Medication Instructions:  Increase Atorvastatin to 40mg  Daily   If you need a refill on your cardiac medications before your next appointment, please call your pharmacy.   Lab work: Lipids and Hepatic Function in 2 months  If you have labs (blood work) drawn today and your tests are completely normal, you will receive your results only by: [place] (if you have MyChart) OR A paper copy in the mail If you have any lab test that is abnormal or we need to change your treatment, we will call you to review the results.  Testing/Procedures: NONE  Follow-Up: At [facility], you and your health needs are our priority.  As part of our continuing mission to provide you with exceptional heart care, we have created designated Provider Care Teams.  These Care Teams include your primary Cardiologist (physician) and Advanced Practice Providers (APPs -  Physician Assistants and Nurse Practitioners) who all work together to provide you with the care you need, when you need it. You may see Quay Burow, MD or one of the following Advanced Practice Providers on your designated Care Team:    Kerin Ransom, PA-C  [place], [place]  Coletta Memos, [place]   Your physician wants you to follow-up in: 6 months with Dr. [name]. You will receive a reminder letter in the mail two months in advance. If you don't receive a letter, please call our office to schedule the follow-up appointment.

## 2019-11-16 NOTE — Progress Notes (Signed)
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 fit appearing widowed African-American male father of 74, grandfather to 3 grandchildren.  I last saw him in the office 08/06/2019.  He was scheduled to have cervical laminectomy today but this was put off because of need for cardiac clearance.His only risk factor is hypertension. Smoking 13 years ago. He's never had a heart attack or stroke. He denies chest pain or shortness of breath. I performed 2-D echocardiography which revealed unexpectedly ejection fraction of 25% with inferior wall akinesis and severe hypokinesia otherwise. A Myoview stress test confirmed the deep diminished ejection fraction but did not show any areas of ischemia or infarction. 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 He underwent right left heart cath by Dr. Ellyn Hack 03/11/16 confirming this. Ultimately, he is begun on carvedilol and lisinopril. His ejection fraction improved up to 35-40% by 2-D echocardiogram performed 06/21/16. He is completely symptomatic. Based on this I cleared him forfor hiselective cervical laminectomy and mildly elevated risk.  Because of her current symptoms of shortness of breath a repeat echo was performed 01/05/2018 revealing EF of 30%. I did refer him to Dr. Haroldine Laws.His heart failure medications were optimized. He did have a cardiac MRI performed 04/01/2018 that showed an EF of 20% with changes consistent with cardiac amyloidosis and he was begun on medications for this. He also had an ICD implanted by Dr. Curt Bears prophylactically 06/07/2018 and has had no discharges. He feels clinically improved on his current medications. He has had bilateral pulmonary emboli as well on Eliquis oral anticoagulation.  He complained of some left calf pain and had a venous ultrasound  done at [facility] for a left popliteal DVT which was acute.  He was hospitalized and deemed in Eliquis failure.  Lovenox was begun as was Coumadin oral anticoagulation.  Unfortunately, he left the hospital AMA .  He is apparently been compliant with his other medications.  He denies symptoms of heart failure.  He is not seen Dr. Haroldine Laws back since January.  Since I saw him 3 months ago he continues to do well.  He remains on Coumadin anticoagulation.  He denies chest pain or shortness of breath.  He is a little hypotensive today but is asymptomatic from this.  He scheduled to see Dr. Haroldine Laws in the office next month.   Current Meds  Medication Sig  . acetaminophen (TYLENOL) 500 MG tablet Take 1,000 mg by mouth every 6 (six) hours as needed for moderate pain or headache.  . albuterol (VENTOLIN HFA) 108 (90 Base) MCG/ACT inhaler Inhale 2 puffs into the lungs every 6 (six) hours as needed for wheezing or shortness of breath.  Marland Kitchen amitriptyline (ELAVIL) 25 MG tablet TAKE 1 TABLET(25 MG) BY MOUTH AT BEDTIME AS NEEDED FOR SLEEP (Patient taking differently: Take 25 mg by mouth at bedtime as needed for sleep. )  . Aromatic Inhalants (VICKS VAPOINHALER) INHA Inhale 1 Dose into the lungs daily as needed (congestion).  Marland Kitchen atorvastatin (LIPITOR) 40 MG tablet Take 1 tablet (40 mg total) by mouth daily.  . carvedilol (COREG) 25 MG tablet Take 1 tablet (25 mg total) by mouth 2 (two) times daily with a meal.  . Chlorphen-Phenyleph-ASA (ALKA-SELTZER PLUS COLD PO) Take 1-2 tablets by mouth 2 (two) times daily as  needed (cold symptoms).   . Cholecalciferol (VITAMIN D) 50 MCG (2000 UT) tablet Take 2,000 Units by mouth daily.  Marland Kitchen docusate sodium (COLACE) 100 MG capsule Take 1 capsule (100 mg total) by mouth 2 (two) times daily.  . hydrALAZINE (APRESOLINE) 50 MG tablet TAKE 1 TABLET(50 MG) BY MOUTH THREE TIMES DAILY. FOLLOW UP APPOINTMENT PLEASE (Patient taking differently: Take 50 mg by mouth 3  (three) times daily. )  . isosorbide mononitrate (IMDUR) 30 MG 24 hr tablet Take 1 tablet (30 mg total) by mouth at bedtime.  . metFORMIN (GLUCOPHAGE) 500 MG tablet Take 1 tablet (500 mg total) by mouth daily with breakfast.  . methocarbamol (ROBAXIN) 500 MG tablet Take 1 tablet (500 mg total) by mouth every 8 (eight) hours as needed for muscle spasms.  Marland Kitchen oxyCODONE (ROXICODONE) 5 MG immediate release tablet Take 1 tablet (5 mg total) by mouth every 8 (eight) hours as needed.  . pseudoephedrine (SUDAFED) 30 MG tablet Take 30 mg by mouth every 4 (four) hours as needed for congestion.  . sacubitril-valsartan (ENTRESTO) 97-103 MG Take 1 tablet by mouth 2 (two) times daily.  . sildenafil (VIAGRA) 50 MG tablet TAKE 1 TABLET BY MOUTH EVERY DAIY AS NEEDED FOR ERECTILE DYSFUNCTION (Patient taking differently: Take 50 mg by mouth daily as needed for erectile dysfunction. )  . spironolactone (ALDACTONE) 25 MG tablet Take 0.5 tablets (12.5 mg total) by mouth daily.  . Tafamidis (VYNDAMAX) 61 MG CAPS Take 61 mg by mouth daily.  Marland Kitchen warfarin (COUMADIN) 5 MG tablet Take 1 to 2 tablets by mouth daily as directed (Patient taking differently: Take 7.5 mg by mouth daily. )  . [DISCONTINUED] atorvastatin (LIPITOR) 20 MG tablet Take 1 tablet (20 mg total) by mouth daily.     Allergies  Allergen Reactions  . Peanut-Containing Drug Products Swelling  . Penicillins Other (See Comments)    CONVULSIONS Did it involve swelling of the face/tongue/throat, SOB, or low BP? No Did it involve sudden or severe rash/hives, skin peeling, or any reaction on the inside of your mouth or nose? No Did you need to seek medical attention at a hospital or doctor's office? Yes When did it last happen?25 years If all above answers are "NO", may proceed with cephalosporin use.   . Decadron [Dexamethasone] Itching    Social History   Socioeconomic History  . Marital status: Legally Separated    Spouse name: Not on file  .  Number of children: 3  . Years of education: Not on file  . Highest education level: Not on file  Occupational History  . Not on file  Tobacco Use  . Smoking status: Former Smoker    Packs/day: 1.50    Years: 8.00    Pack years: 12.00    Quit date: 03/15/2003    Years since quitting: 16.6  . Smokeless tobacco: Never Used  Substance and Sexual Activity  . Alcohol use: No    Comment: none since 2004  . Drug use: No    Comment: former  none since 2004  . Sexual activity: Not Currently  Other Topics Concern  . Not on file  Social History Narrative  . Not on file   Social Determinants of Health   Financial Resource Strain:   . Difficulty of Paying Living Expenses: Not on file  Food Insecurity:   . Worried About Charity fundraiser in the Last Year: Not on file  . Ran Out of Food in the Last Year: Not  on file  Transportation Needs:   . Lack of Transportation (Medical): Not on file  . Lack of Transportation (Non-Medical): Not on file  Physical Activity:   . Days of Exercise per Week: Not on file  . Minutes of Exercise per Session: Not on file  Stress:   . Feeling of Stress : Not on file  Social Connections:   . Frequency of Communication with Friends and Family: Not on file  . Frequency of Social Gatherings with Friends and Family: Not on file  . Attends Religious Services: Not on file  . Active Member of Clubs or Organizations: Not on file  . Attends Archivist Meetings: Not on file  . Marital Status: Not on file  Intimate Partner Violence:   . Fear of Current or Ex-Partner: Not on file  . Emotionally Abused: Not on file  . Physically Abused: Not on file  . Sexually Abused: Not on file     Review of Systems: General: negative for chills, fever, night sweats or weight changes.  Cardiovascular: negative for chest pain, dyspnea on exertion, edema, orthopnea, palpitations, paroxysmal nocturnal dyspnea or shortness of breath Dermatological: negative for  rash Respiratory: negative for cough or wheezing Urologic: negative for hematuria Abdominal: negative for nausea, vomiting, diarrhea, bright red blood per rectum, melena, or hematemesis Neurologic: negative for visual changes, syncope, or dizziness All other systems reviewed and are otherwise negative except as noted above.    Blood pressure 94/62, pulse 91, height 5\' 6"  (1.676 m), weight 177 lb (80.3 kg).  General appearance: alert and no distress Neck: no adenopathy, no carotid bruit, no JVD, supple, symmetrical, trachea midline and thyroid not enlarged, symmetric, no tenderness/mass/nodules Lungs: clear to auscultation bilaterally Heart: regular rate and rhythm, S1, S2 normal, no murmur, click, rub or gallop Extremities: extremities normal, atraumatic, no cyanosis or edema Pulses: 2+ and symmetric Skin: Skin color, texture, turgor normal. No rashes or lesions Neurologic: Alert and oriented X 3, normal strength and tone. Normal symmetric reflexes. Normal coordination and gait  EKG not performed today  ASSESSMENT AND PLAN:   Essential hypertension History of essential hypertension with blood pressure measured today at 94/62.  He is on carvedilol, hydralazine and Entresto.  Congestive dilated cardiomyopathy (HCC) History of dilated nonischemic cardiomyopathy status post confirmatory cardiac catheterization by Dr. Ellyn Hack 03/11/2016 on optimal medical therapy followed by Dr. Haroldine Laws.  He did have a cardiac MRI that showed cardiac amyloidosis.  He has had an ICD implanted by Dr. Curt Bears for primary prevention.  Bilateral pulmonary embolism (HCC) History of bilateral pulmonary embolism and popliteal vein DVT on Eliquis deemed a failure currently on Coumadin anticoagulation.  Hyperlipidemia associated with type 2 diabetes mellitus ([place]) History of hyperlipidemia on atorvastatin 20 mg a day with lipid profile performed 05/13/2019 revealing total cholesterol of 231, LDL 131 and HDL of 39.   I am going to increase his atorvastatin to 40 mg a day and we will recheck a lipid and liver profile in 2 months      Lorretta Harp MD [facility], [facility] 11/16/2019 4:48 PM

## 2019-11-16 NOTE — Assessment & Plan Note (Signed)
History of hyperlipidemia on atorvastatin 20 mg a day with lipid profile performed 05/13/2019 revealing total cholesterol of 231, LDL 131 and HDL of 39.  I am going to increase his atorvastatin to 40 mg a day and we will recheck a lipid and liver profile in 2 months

## 2019-11-16 NOTE — Assessment & Plan Note (Signed)
History of essential hypertension with blood pressure measured today at 94/62.  He is on carvedilol, hydralazine and Entresto.

## 2019-11-16 NOTE — Assessment & Plan Note (Signed)
History of dilated nonischemic cardiomyopathy status post confirmatory cardiac catheterization by Dr. Ellyn Hack 03/11/2016 on optimal medical therapy followed by Dr. Haroldine Laws.  He did have a cardiac MRI that showed cardiac amyloidosis.  He has had an ICD implanted by Dr. Curt Bears for primary prevention.

## 2019-11-16 NOTE — Assessment & Plan Note (Signed)
History of bilateral pulmonary embolism and popliteal vein DVT on Eliquis deemed a failure currently on Coumadin anticoagulation.

## 2019-11-23 MED FILL — VYNDAMAX 61 MG CAPS: 61 | 30 days supply | Qty: 30 | Fill #1

## 2019-12-08 ENCOUNTER — Telehealth: Payer: Self-pay

## 2019-12-08 NOTE — Telephone Encounter (Signed)
lmom to move appt from 12/13/19 at 930 am for coumadin to 1030am on the same day

## 2019-12-13 ENCOUNTER — Ambulatory Visit (INDEPENDENT_AMBULATORY_CARE_PROVIDER_SITE_OTHER): Payer: Self-pay | Admitting: Pharmacist

## 2019-12-13 ENCOUNTER — Other Ambulatory Visit: Payer: Self-pay

## 2019-12-13 DIAGNOSIS — I824Y9 Acute embolism and thrombosis of unspecified deep veins of unspecified proximal lower extremity: Secondary | ICD-10-CM

## 2019-12-13 DIAGNOSIS — Z7901 Long term (current) use of anticoagulants: Secondary | ICD-10-CM

## 2019-12-13 DIAGNOSIS — I2699 Other pulmonary embolism without acute cor pulmonale: Secondary | ICD-10-CM

## 2019-12-13 LAB — POCT INR: INR: 2.4 (ref 2.0–3.0)

## 2019-12-21 MED FILL — VYNDAMAX 61 MG CAPS: 61 | 30 days supply | Qty: 30 | Fill #2

## 2019-12-28 ENCOUNTER — Ambulatory Visit (INDEPENDENT_AMBULATORY_CARE_PROVIDER_SITE_OTHER): Payer: Medicaid Other | Admitting: *Deleted

## 2019-12-28 DIAGNOSIS — I5022 Chronic systolic (congestive) heart failure: Secondary | ICD-10-CM

## 2019-12-28 LAB — CUP PACEART REMOTE DEVICE CHECK
Battery Remaining Longevity: 82 mo
Battery Remaining Percentage: 81 %
Battery Voltage: 2.99 V
Brady Statistic RV Percent Paced: 1 %
Date Time Interrogation Session: 20210330020016
HighPow Impedance: 72 Ohm
HighPow Impedance: 72 Ohm
Implantable Lead Implant Date: 20190918
Implantable Lead Location: 753860
Implantable Lead Model: 7122
Implantable Pulse Generator Implant Date: 20190918
Lead Channel Impedance Value: 450 Ohm
Lead Channel Pacing Threshold Amplitude: 0.5 V
Lead Channel Pacing Threshold Pulse Width: 0.5 ms
Lead Channel Sensing Intrinsic Amplitude: 12 mV
Lead Channel Setting Pacing Amplitude: 2.5 V
Lead Channel Setting Pacing Pulse Width: 0.5 ms
Lead Channel Setting Sensing Sensitivity: 0.5 mV
Pulse Gen Serial Number: 9796641

## 2019-12-28 NOTE — Progress Notes (Signed)
ICD remote 

## 2020-01-13 ENCOUNTER — Other Ambulatory Visit: Payer: Self-pay

## 2020-01-13 MED ORDER — WARFARIN SODIUM 5 MG PO TABS: ORAL_TABLET | ORAL | 0 refills | Status: DC

## 2020-01-17 ENCOUNTER — Other Ambulatory Visit: Payer: Self-pay

## 2020-01-17 ENCOUNTER — Ambulatory Visit (INDEPENDENT_AMBULATORY_CARE_PROVIDER_SITE_OTHER): Payer: Self-pay | Admitting: Pharmacist

## 2020-01-17 DIAGNOSIS — I824Y9 Acute embolism and thrombosis of unspecified deep veins of unspecified proximal lower extremity: Secondary | ICD-10-CM

## 2020-01-17 DIAGNOSIS — I2699 Other pulmonary embolism without acute cor pulmonale: Secondary | ICD-10-CM

## 2020-01-17 DIAGNOSIS — Z7901 Long term (current) use of anticoagulants: Secondary | ICD-10-CM

## 2020-01-17 LAB — POCT INR: INR: 3.1 — AB (ref 2.0–3.0)

## 2020-01-18 MED FILL — VYNDAMAX 61 MG CAPS: 61 | 30 days supply | Qty: 30 | Fill #3

## 2020-01-26 ENCOUNTER — Other Ambulatory Visit: Payer: Self-pay | Admitting: Cardiovascular Disease

## 2020-02-02 ENCOUNTER — Other Ambulatory Visit: Payer: Self-pay | Admitting: Cardiology

## 2020-02-02 DIAGNOSIS — I5022 Chronic systolic (congestive) heart failure: Secondary | ICD-10-CM

## 2020-02-02 DIAGNOSIS — I428 Other cardiomyopathies: Secondary | ICD-10-CM

## 2020-02-02 DIAGNOSIS — I10 Essential (primary) hypertension: Secondary | ICD-10-CM

## 2020-02-04 ENCOUNTER — Other Ambulatory Visit: Payer: Self-pay

## 2020-02-04 ENCOUNTER — Ambulatory Visit: Payer: Self-pay | Attending: Internal Medicine | Admitting: Internal Medicine

## 2020-02-04 DIAGNOSIS — Z86718 Personal history of other venous thrombosis and embolism: Secondary | ICD-10-CM

## 2020-02-04 DIAGNOSIS — I10 Essential (primary) hypertension: Secondary | ICD-10-CM

## 2020-02-04 DIAGNOSIS — Z86711 Personal history of pulmonary embolism: Secondary | ICD-10-CM

## 2020-02-04 DIAGNOSIS — E1159 Type 2 diabetes mellitus with other circulatory complications: Secondary | ICD-10-CM

## 2020-02-04 DIAGNOSIS — M25512 Pain in left shoulder: Secondary | ICD-10-CM

## 2020-02-04 DIAGNOSIS — I428 Other cardiomyopathies: Secondary | ICD-10-CM

## 2020-02-04 DIAGNOSIS — G8929 Other chronic pain: Secondary | ICD-10-CM

## 2020-02-04 MED ORDER — DICLOFENAC SODIUM 1 % EX GEL: 2.0000 g | Freq: Four times a day (QID) | CUTANEOUS | 1 refills | Status: DC

## 2020-02-04 NOTE — Progress Notes (Signed)
Virtual Visit via Telephone Note Due to current restrictions/limitations of in-office visits due to the COVID-19 pandemic, this scheduled clinical appointment was converted to a telehealth visit  I connected with Jonathan Dickson on 02/04/20 at 8:41 a.m by telephone and verified that I am speaking with the correct person using two identifiers. I am in my office.  The patient is at home.  Only the patient and myself participated in this encounter.  I discussed the limitations, risks, security and privacy concerns of performing an evaluation and management service by telephone and the availability of in person appointments. I also discussed with the patient that there may be a patient responsible charge related to this service. The patient expressed understanding and agreed to proceed.   History of Present Illness: Pt with hx of NICM, chronic systolic CHF with recent EF of 30-35%, ICD,,cardiac amyloidosisHTN, DM,BL PE/DVT LLE  (plan for lifelong anticoagulation), insomnia, hx of COVID with DVT  Shooting pain from shoulder to neck on LT side x few mths.  Feels like electricity.  No initiating factors.  Does not relate it to movement of arm or neck.  "I could just be sitting down and it happens."  No weakness in the shoulder. -occurs 2-3 x/wk lasting a split second. -Taking Tylenol Extra Strength and does not seem to help.  Would like to try a pain rub.  DIABETES TYPE 2 Last A1C:   Med Adherence:  [x]  Yes - Metformin    []  No Medication side effects:  []  Yes    [x]  No Home Monitoring?  [x]  Yes QOD    []  No Home glucose results range:last reading was 94.  Reports readings have been good Diet Adherence: [x]  Yes    []  No Exercise: [x]  Yes  -rides stationary bike 30 mins daily Hypoglycemic episodes?: []  Yes    [x]  No Numbness of the feet? []  Yes    [x]  No Retinopathy hx? []  Yes    []  No Last eye exam: 06/2019 Comments:  HYPERTENSION/NICM/Cardiac Amyloidosis Currently taking: see medication  list Med Adherence: [x]  Yes-his guideline directed medications include spironolactone, furosemide, tafamidis, carvedilol, Entresto, isosorbide and hydralazine     Medication side effects: []  Yes    [x]  No Adherence with salt restriction: [x]  Yes    []  No Home Monitoring?: [x]  Yes    []  No Monitoring Frequency:  Home BP results range: No systolic blood pressure greater than 130. SOB? []  Yes    [x]  No Chest Pain?: []  Yes    [x]  No Leg swelling?: []  Yes    [x]  No Headaches?: []  Yes    [x]  No Dizziness? []  Yes    [x]  No Comments:   Hx DVT/PE:  No bleeding or bruising on Coumadin.  Has his INR checked through cardiology clinic.  Most recent INR was 3.1.  HM: Did the Materna COVID-19 vaccine.  Dates were 2/25 and 12/23/2019.  Outpatient Encounter Medications as of 02/04/2020  Medication Sig  . acetaminophen (TYLENOL) 500 MG tablet Take 1,000 mg by mouth every 6 (six) hours as needed for moderate pain or headache.  . albuterol (VENTOLIN HFA) 108 (90 Base) MCG/ACT inhaler Inhale 2 puffs into the lungs every 6 (six) hours as needed for wheezing or shortness of breath.  Marland Kitchen amitriptyline (ELAVIL) 25 MG tablet TAKE 1 TABLET(25 MG) BY MOUTH AT BEDTIME AS NEEDED FOR SLEEP (Patient taking differently: Take 25 mg by mouth at bedtime as needed for sleep. )  . Aromatic Inhalants (VICKS VAPOINHALER) INHA Inhale  1 Dose into the lungs daily as needed (congestion).  Marland Kitchen atorvastatin (LIPITOR) 40 MG tablet Take 1 tablet (40 mg total) by mouth daily.  . carvedilol (COREG) 25 MG tablet TAKE 1 TABLET(25 MG) BY MOUTH TWICE DAILY WITH A MEAL  . Chlorphen-Phenyleph-ASA (ALKA-SELTZER PLUS COLD PO) Take 1-2 tablets by mouth 2 (two) times daily as needed (cold symptoms).   . Cholecalciferol (VITAMIN D) 50 MCG (2000 UT) tablet Take 2,000 Units by mouth daily.  . furosemide (LASIX) 40 MG tablet Take 1 tablet (40 mg total) by mouth daily as needed for fluid (if weight gain more than 3 punds).  . hydrALAZINE (APRESOLINE) 50 MG  tablet Take 1 tablet (50 mg total) by mouth 3 (three) times daily.  . isosorbide mononitrate (IMDUR) 30 MG 24 hr tablet Take 1 tablet (30 mg total) by mouth at bedtime.  . metFORMIN (GLUCOPHAGE) 500 MG tablet Take 1 tablet (500 mg total) by mouth daily with breakfast.  . methocarbamol (ROBAXIN) 500 MG tablet Take 1 tablet (500 mg total) by mouth every 8 (eight) hours as needed for muscle spasms. (Patient not taking: Reported on 02/04/2020)  . oxyCODONE (ROXICODONE) 5 MG immediate release tablet Take 1 tablet (5 mg total) by mouth every 8 (eight) hours as needed. (Patient not taking: Reported on 02/04/2020)  . pseudoephedrine (SUDAFED) 30 MG tablet Take 30 mg by mouth every 4 (four) hours as needed for congestion.  . sacubitril-valsartan (ENTRESTO) 97-103 MG Take 1 tablet by mouth 2 (two) times daily.  . sildenafil (VIAGRA) 50 MG tablet TAKE 1 TABLET BY MOUTH EVERY DAIY AS NEEDED FOR ERECTILE DYSFUNCTION (Patient taking differently: Take 50 mg by mouth daily as needed for erectile dysfunction. )  . spironolactone (ALDACTONE) 25 MG tablet Take 0.5 tablets (12.5 mg total) by mouth daily.  . Tafamidis (VYNDAMAX) 61 MG CAPS Take 61 mg by mouth daily.  Marland Kitchen warfarin (COUMADIN) 5 MG tablet Take 1 to 2 tablets by mouth daily as directed   No facility-administered encounter medications on file as of 02/04/2020.      Observations/Objective: Lab Results  Component Value Date   WBC 7.0 10/21/2019   HGB 13.1 10/21/2019   HCT 41.2 10/21/2019   MCV 89.2 10/21/2019   PLT 229 10/21/2019     Chemistry      Component Value Date/Time   NA 139 11/04/2019 1020   K 4.7 11/04/2019 1020   CL 105 11/04/2019 1020   CO2 17 (L) 11/04/2019 1020   BUN 12 11/04/2019 1020   CREATININE 0.98 11/04/2019 1020   CREATININE 0.98 03/21/2016 0953      Component Value Date/Time   CALCIUM 9.8 11/04/2019 1020   ALKPHOS 72 11/04/2019 1020   AST 21 11/04/2019 1020   ALT 25 11/04/2019 1020   BILITOT 0.2 11/04/2019 1020        Assessment and Plan: 1. 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  Encouraged him to continue healthy eating habits and regular exercise  2. Essential hypertension At goal.  Continue current medications and low-salt diet  3. NICM (nonischemic cardiomyopathy) ([place]) Clinically stable and followed by cardiology.  4. History of pulmonary embolus (PE) 5. History of  deep vein thrombosis (DVT) Tolerating and doing well on Coumadin without any bleeding events.  6. Chronic left shoulder pain Sounds like it may be a neurologic issue.  Advised patient to pay attention to see whether pain occurs with certain movements of the neck or with movement of  the arm.  He wants to try a pain rub so will use Voltaren gel.  I think he would most likely end up needing something like gabapentin. - diclofenac Sodium (VOLTAREN) 1 % GEL; Apply 2 g topically 4 (four) times daily.  Dispense: 100 g; Refill: 1  Follow Up Instructions: 4 mths   I discussed the assessment and treatment plan with the patient. The patient was provided an opportunity to ask questions and all were answered. The patient agreed with the plan and demonstrated an understanding of the instructions.   The patient was advised to call back or seek an in-person evaluation if the symptoms worsen or if the condition fails to improve as anticipated.  I provided 10 minutes of non-face-to-face time during this encounter.   Karle Plumber, MD

## 2020-02-09 ENCOUNTER — Other Ambulatory Visit: Payer: Self-pay

## 2020-02-09 ENCOUNTER — Encounter (HOSPITAL_COMMUNITY): Payer: Self-pay | Admitting: Internal Medicine

## 2020-02-09 ENCOUNTER — Ambulatory Visit (HOSPITAL_COMMUNITY)
Admission: RE | Admit: 2020-02-09 | Discharge: 2020-02-09 | Disposition: A | Discharge status: Home / Self Care | Payer: Self-pay | Source: Ambulatory Visit | Attending: Internal Medicine | Admitting: Internal Medicine

## 2020-02-09 VITALS — BP 132/86 | HR 71 | Wt 167.6 lb

## 2020-02-09 DIAGNOSIS — F329 Major depressive disorder, single episode, unspecified: Secondary | ICD-10-CM | POA: Insufficient documentation

## 2020-02-09 DIAGNOSIS — Z7901 Long term (current) use of anticoagulants: Secondary | ICD-10-CM | POA: Insufficient documentation

## 2020-02-09 DIAGNOSIS — Z87891 Personal history of nicotine dependence: Secondary | ICD-10-CM | POA: Insufficient documentation

## 2020-02-09 DIAGNOSIS — I5022 Chronic systolic (congestive) heart failure: Secondary | ICD-10-CM

## 2020-02-09 DIAGNOSIS — I2699 Other pulmonary embolism without acute cor pulmonale: Secondary | ICD-10-CM

## 2020-02-09 DIAGNOSIS — Z86711 Personal history of pulmonary embolism: Secondary | ICD-10-CM | POA: Insufficient documentation

## 2020-02-09 DIAGNOSIS — E119 Type 2 diabetes mellitus without complications: Secondary | ICD-10-CM | POA: Insufficient documentation

## 2020-02-09 DIAGNOSIS — I43 Cardiomyopathy in diseases classified elsewhere: Secondary | ICD-10-CM

## 2020-02-09 DIAGNOSIS — Z7984 Long term (current) use of oral hypoglycemic drugs: Secondary | ICD-10-CM | POA: Insufficient documentation

## 2020-02-09 DIAGNOSIS — Z880 Allergy status to penicillin: Secondary | ICD-10-CM | POA: Insufficient documentation

## 2020-02-09 DIAGNOSIS — I5042 Chronic combined systolic (congestive) and diastolic (congestive) heart failure: Secondary | ICD-10-CM | POA: Insufficient documentation

## 2020-02-09 DIAGNOSIS — Z833 Family history of diabetes mellitus: Secondary | ICD-10-CM | POA: Insufficient documentation

## 2020-02-09 DIAGNOSIS — I110 Hypertensive heart disease with heart failure: Secondary | ICD-10-CM | POA: Insufficient documentation

## 2020-02-09 DIAGNOSIS — Z86718 Personal history of other venous thrombosis and embolism: Secondary | ICD-10-CM | POA: Insufficient documentation

## 2020-02-09 DIAGNOSIS — E854 Organ-limited amyloidosis: Secondary | ICD-10-CM

## 2020-02-09 DIAGNOSIS — I252 Old myocardial infarction: Secondary | ICD-10-CM | POA: Insufficient documentation

## 2020-02-09 DIAGNOSIS — Z791 Long term (current) use of non-steroidal anti-inflammatories (NSAID): Secondary | ICD-10-CM | POA: Insufficient documentation

## 2020-02-09 DIAGNOSIS — Z8616 Personal history of COVID-19: Secondary | ICD-10-CM | POA: Insufficient documentation

## 2020-02-09 DIAGNOSIS — I824Y9 Acute embolism and thrombosis of unspecified deep veins of unspecified proximal lower extremity: Secondary | ICD-10-CM

## 2020-02-09 DIAGNOSIS — Z79899 Other long term (current) drug therapy: Secondary | ICD-10-CM | POA: Insufficient documentation

## 2020-02-09 DIAGNOSIS — J849 Interstitial pulmonary disease, unspecified: Secondary | ICD-10-CM

## 2020-02-09 LAB — BASIC METABOLIC PANEL
Anion gap: 10 (ref 5–15)
BUN: 9 mg/dL (ref 8–23)
CO2: 20 mmol/L — ABNORMAL LOW (ref 22–32)
Calcium: 9 mg/dL (ref 8.9–10.3)
Chloride: 107 mmol/L (ref 98–111)
Creatinine, Ser: 0.92 mg/dL (ref 0.61–1.24)
GFR calc Af Amer: >60 mL/min (ref >60)
GFR calc non Af Amer: >60 mL/min (ref >60)
Glucose, Bld: 115 mg/dL — ABNORMAL HIGH (ref 70–99)
Potassium: 4.3 mmol/L (ref 3.5–5.1)
Sodium: 137 mmol/L (ref 135–145)

## 2020-02-09 LAB — CBC
HCT: 43 % (ref 39.0–52.0)
Hemoglobin: 13.4 g/dL (ref 13.0–17.0)
MCH: 26.2 pg (ref 26.0–34.0)
MCHC: 31.2 g/dL (ref 30.0–36.0)
MCV: 84.1 fL (ref 80.0–100.0)
Platelets: 212 10*3/uL (ref 150–400)
RBC: 5.11 MIL/uL (ref 4.22–5.81)
RDW: 14.5 % (ref 11.5–15.5)
WBC: 4.6 10*3/uL (ref 4.0–10.5)
nRBC: 0 % (ref 0.0–0.2)

## 2020-02-09 LAB — BRAIN NATRIURETIC PEPTIDE: B Natriuretic Peptide: 315.9 pg/mL — ABNORMAL HIGH (ref 0.0–100.0)

## 2020-02-09 MED ORDER — DAPAGLIFLOZIN PROPANEDIOL 10 MG PO TABS
10.0000 mg | ORAL_TABLET | Freq: Every day | ORAL | 5 refills | Status: DC
Start: 2020-02-09 — End: 2020-07-25

## 2020-02-09 NOTE — Progress Notes (Signed)
[facility] CLINIC Referring Physician: Dr [name]  Primary Care: Dr Wynetta Emery at [facility]  Primary Cardiologist: Dr [name] EP: Dr Curt Bears HF: Dr Haroldine Laws  HPI: Jonathan Dickson is a 64 y.o. male with a HTN, previous polysubstance abuse - including heroin (quit 2004) and depression, heart failure, TTR cardiac amyloidosis, and PE ~ 2019    Had Myoview in 5/17 with EF 22%. Subsequently underwent cath 6/17. EF 40-45%. Normal coronaries   Admitted to [facility] 8/19 with increased dyspnea. Diuresed with IV lasix. CTA performed and showed bilateral PE. Had AKI so diuretics held.  Placed on eliquis for PE.    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  Had u/s in 11/20 with acute LLE DVT. Switched from Eliquis to coumadin. Got COVID PNA over Christmas 12/20. CT chest showed no PE.   He returns today for regular follow up. Now on tafamdis. Says he feels ok. 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 Can go up a flight of steps but gets winded going to store or walking up hiils. No edema, orthopnea or PND.    ICD: No VT/VF. Impedance ok. Activity level back up. Personally reviewed    Cardiac Testing CTA 05/12/2018 --.large bilateral  PE   CPX 04/03/18 FVC 2.77 (83%)    FEV1 2.37 (91%)     FEV1/FVC 86 (109%)     MVV 76 (61%)  Resting HR: 83 Peak HR: 125  (80% age predicted max HR) BP rest: 144/84 BP peak: 168/72 Peak VO2: 23.1 (76% predicted peak VO2) VE/VCO2 slope: 41 OUES: 2.18 Peak RER: 0.93 VE/MVV: 88% O2pulse: 14  (100% predicted O2pulse)  PYP scan obtained which was positive (Visual grade 2. Quantitative 1.57).  cMRI 04/01/18 1. Mild LVE with mild LVH septal thickness 13 mm. Diffuse hypokinesis worse in the septum apex and lateral walls EF 28% 2. Abnormal post gadolinium inversion recovery sequences with failure to null and uptake in the mid/subepicardial septum, inferior wall and lateral walls with some apical sparing Findings  consistent with amyloidosis or infiltrative cardiomyopathy  Echo 8/12/19LVEF ~30% (continues to drop in setting of TTR amyloid). RV normal without evidence of acute strain. LE u/s without DVT.  ECHO 12/2017   Left ventricle: EF 30% to 35% (I felt closer to 40-45%). Mild LVH Diffuse hypokinesis. Doppler   parameters are consistent with abnormal left ventricular   relaxation (grade 1 diastolic dysfunction).  ECHO EF 08/2017  Left ventricle: EF 50% to 55%.   Review of systems complete and found to be negative unless listed in HPI.   Past Medical History:  Diagnosis Date  . Abnormal TSH 02/2016  . Anxiety   . Cervical disc disease   . Chronic combined systolic and diastolic CHF (congestive heart failure) ([place])    a. 01/2016 Echo: EF 25%, inf AK, diffuse sev HK, Gr1 DD, mild MR.  . Depression   . History of hiatal hernia   . Hyperglycemia   . Hypertension   . Hypertensive heart disease   . Myocardial infarction [facility])    ??  maybe  . NICM (nonischemic cardiomyopathy) ([place])    a. 01/2016 Echo: EF 25%, inf AK, diffuse sev HK, Gr1 DD, mild MR; b. 01/2016 MV: EF 22%, no isch/infarct;  c. 02/2016 Cath: Nl cors, EF 35-45%.  . Pneumonia   . Pre-diabetes   . Seizures ([place])    with penicillin    Current Outpatient Medications  Medication Sig Dispense Refill  .  acetaminophen (TYLENOL) 500 MG tablet Take 1,000 mg by mouth every 6 (six) hours as needed for moderate pain or headache.    . albuterol (VENTOLIN HFA) 108 (90 Base) MCG/ACT inhaler Inhale 2 puffs into the lungs every 6 (six) hours as needed for wheezing or shortness of breath. 8 g 2  . amitriptyline (ELAVIL) 25 MG tablet TAKE 1 TABLET(25 MG) BY MOUTH AT BEDTIME AS NEEDED FOR SLEEP (Patient taking differently: Take 25 mg by mouth at bedtime as needed for sleep. ) 30 tablet 0  . Aromatic Inhalants (VICKS VAPOINHALER) INHA Inhale 1 Dose into the lungs daily as needed (congestion).    Marland Kitchen atorvastatin (LIPITOR) 40 MG tablet Take 1 tablet (40  mg total) by mouth daily. 90 tablet 3  . carvedilol (COREG) 25 MG tablet TAKE 1 TABLET(25 MG) BY MOUTH TWICE DAILY WITH A MEAL 180 tablet 2  . Chlorphen-Phenyleph-ASA (ALKA-SELTZER PLUS COLD PO) Take 1-2 tablets by mouth 2 (two) times daily as needed (cold symptoms).     . Cholecalciferol (VITAMIN D) 50 MCG (2000 UT) tablet Take 2,000 Units by mouth daily.    . diclofenac Sodium (VOLTAREN) 1 % GEL Apply 2 g topically 4 (four) times daily. 100 g 1  . furosemide (LASIX) 40 MG tablet Take 1 tablet (40 mg total) by mouth daily as needed for fluid (if weight gain more than 3 punds). 30 tablet 0  . hydrALAZINE (APRESOLINE) 50 MG tablet Take 1 tablet (50 mg total) by mouth 3 (three) times daily. 270 tablet 0  . isosorbide mononitrate (IMDUR) 30 MG 24 hr tablet Take 1 tablet (30 mg total) by mouth at bedtime. 90 tablet 3  . metFORMIN (GLUCOPHAGE) 500 MG tablet Take 1 tablet (500 mg total) by mouth daily with breakfast. 90 tablet 3  . methocarbamol (ROBAXIN) 500 MG tablet Take 1 tablet (500 mg total) by mouth every 8 (eight) hours as needed for muscle spasms. 15 tablet 0  . pseudoephedrine (SUDAFED) 30 MG tablet Take 30 mg by mouth every 4 (four) hours as needed for congestion.    . sacubitril-valsartan (ENTRESTO) 97-103 MG Take 1 tablet by mouth 2 (two) times daily. 60 tablet 6  . sildenafil (VIAGRA) 50 MG tablet TAKE 1 TABLET BY MOUTH EVERY DAIY AS NEEDED FOR ERECTILE DYSFUNCTION (Patient taking differently: Take 50 mg by mouth daily as needed for erectile dysfunction. ) 10 tablet 2  . spironolactone (ALDACTONE) 25 MG tablet Take 0.5 tablets (12.5 mg total) by mouth daily. 15 tablet 11  . Tafamidis (VYNDAMAX) 61 MG CAPS Take 61 mg by mouth daily. 30 capsule 11  . warfarin (COUMADIN) 5 MG tablet Take 1 to 2 tablets by mouth daily as directed 90 tablet 0   No current facility-administered medications for this encounter.    Allergies  Allergen Reactions  . Peanut-Containing Drug Products Swelling  .  Penicillins Other (See Comments)    CONVULSIONS Did it involve swelling of the face/tongue/throat, SOB, or low BP? No Did it involve sudden or severe rash/hives, skin peeling, or any reaction on the inside of your mouth or nose? No Did you need to seek medical attention at a hospital or doctor's office? Yes When did it last happen?25 years If all above answers are "NO", may proceed with cephalosporin use.   . Decadron [Dexamethasone] Itching      Social History   Socioeconomic History  . Marital status: Legally Separated    Spouse name: Not on file  . Number of children: 3  .  Years of education: Not on file  . Highest education level: Not on file  Occupational History  . Not on file  Tobacco Use  . Smoking status: Former Smoker    Packs/day: 1.50    Years: 8.00    Pack years: 12.00    Quit date: 03/15/2003    Years since quitting: 16.9  . Smokeless tobacco: Never Used  Substance and Sexual Activity  . Alcohol use: No    Comment: none since 2004  . Drug use: No    Comment: former  none since 2004  . Sexual activity: Not Currently  Other Topics Concern  . Not on file  Social History Narrative  . Not on file   Social Determinants of Health   Financial Resource Strain:   . Difficulty of Paying Living Expenses:   Food Insecurity:   . Worried About Charity fundraiser in the Last Year:   . Arboriculturist in the Last Year:   Transportation Needs:   . Film/video editor (Medical):   Marland Kitchen Lack of Transportation (Non-Medical):   Physical Activity:   . Days of Exercise per Week:   . Minutes of Exercise per Session:   Stress:   . Feeling of Stress :   Social Connections:   . Frequency of Communication with Friends and Family:   . Frequency of Social Gatherings with Friends and Family:   . Attends Religious Services:   . Active Member of Clubs or Organizations:   . Attends Archivist Meetings:   Marland Kitchen Marital Status:   Intimate Partner Violence:   .  Fear of Current or Ex-Partner:   . Emotionally Abused:   Marland Kitchen Physically Abused:   . Sexually Abused:       Family History  Problem Relation Age of Onset  . Diabetes Mother   . Diabetes Sister     Vitals:   02/09/20 0914  BP: 132/86  Pulse: 71  SpO2: 96%  Weight: 76 kg (167 lb 9.6 oz)    Wt Readings from Last 3 Encounters:  02/09/20 76 kg (167 lb 9.6 oz)  11/16/19 80.3 kg (177 lb)  11/04/19 80 kg (176 lb 6.4 oz)    PHYSICAL EXAM: General:  Well appearing. No resp difficulty HEENT: normal Neck: supple. no JVD. Carotids 2+ bilat; no bruits. No lymphadenopathy or thryomegaly appreciated. Cor: PMI nondisplaced. Regular rate & rhythm. No rubs, gallops or murmurs. Lungs: clear Abdomen: soft, nontender, nondistended. No hepatosplenomegaly. No bruits or masses. Good bowel sounds. Extremities: no cyanosis, clubbing, rash, edema Neuro: alert & orientedx3, cranial nerves grossly intact. moves all 4 extremities w/o difficulty. Affect pleasant   ASSESSMENT & PLAN: 1. Chronic Systolic Heart Failure, [facility].  - ECHO EF dropped from 50-55%-->04/2018 25-30%  - Cath 6/17 normal cors - cMRI 7/19 EF 28% inability to blank myocardium c/w amyloid  - PYP and cMRI testing strongly suggestive of TTR amyloid. No M-spike, normal IFE on myeloma panel.  - Repeat PRY 2/21. 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  - Continue lasix 40 mg daily  - Continue carvedilol 25 mg twice a day.  - Continue Entresto 97-103 mg twice a day   - Continue spiro 12.5 mg daily. - On hydralazine 50 tid and imdur 30 - Add Farxiga 10  - ICD  interrogated personally today in Clinic. No VT/AF. Volume slightly up. Activity level ~1hr/day - Check labs today  2.DMII - Stable On metformin - Add Farxiga 10   3. HTN - Blood pressure well controlled. Continue current  regimen.  4. PE - CTA 04/2018 with large bilateral PE - Was on Eliquis. Now back on warfarin due to acute LLE DVT in 11/20 - CT chest 12/20 no PE - Will get VQ to exclude chronic PE - Needs lifelong AC .   [name], MD  9:53 AM

## 2020-02-09 NOTE — Patient Instructions (Addendum)
START Farxiga 10mg  (1 tab) daily  Your physician has requested that you have an echocardiogram. Echocardiography is a painless test that uses sound waves to create images of your heart. It provides your doctor with information about the size and shape of your heart and how well your heart's chambers and valves are working. This procedure takes approximately one hour. There are no restrictions for this procedure.  ECHO Appointment: Monday May 17th, 2021 at 8:15am  You have been ordered for a VQ scan. You will get a call to schedule this test  Labs today We will only contact you if something comes back abnormal or we need to make some changes. Otherwise no news is good news!  Your physician wants you to follow-up in: 6 months. You will receive a reminder letter in the mail two months in advance. If you don't receive a letter, please call our office to schedule the follow-up appointment.  Please call office at [phone] option [phone] if you have any questions or concerns.   At the [facility], you and your health needs are our priority. As part of our continuing mission to provide you with exceptional heart care, we have created designated Provider Care Teams. These Care Teams include your primary Cardiologist (physician) and Advanced Practice Providers (APPs- Physician Assistants and Nurse Practitioners) who all work together to provide you with the care you need, when you need it.   You may see any of the following providers on your designated Care Team at your next follow up: Marland Kitchen Dr [name] . Dr Loralie Champagne . Darrick Grinder, NP . Lyda Jester, PA . Audry Riles, PharmD   Please be sure to bring in all your medications bottles to every appointment.

## 2020-02-10 ENCOUNTER — Telehealth: Payer: Self-pay

## 2020-02-10 NOTE — Telephone Encounter (Signed)
Caller is from patient apartment complex.  Patient was with caller on speaker phone.  He verified identity and authorized me to speak about his medical background in presence of caller.  Caller inquiring if we have a support group for patients with ICDs.  Advised we did hav eone prior to COVID, however due to social distancing guideline we hav enot been able to run one during [place].  Noted that patient is overdue for an appointment with MD.  Recommended he have f/u with MD or APP if he feels he needs support at this time.

## 2020-02-14 ENCOUNTER — Other Ambulatory Visit: Payer: Self-pay

## 2020-02-14 ENCOUNTER — Ambulatory Visit (HOSPITAL_COMMUNITY)
Admission: RE | Admit: 2020-02-14 | Discharge: 2020-02-14 | Disposition: A | Discharge status: Home / Self Care | Payer: Self-pay | Source: Ambulatory Visit | Attending: Internal Medicine | Admitting: Internal Medicine

## 2020-02-14 ENCOUNTER — Ambulatory Visit (INDEPENDENT_AMBULATORY_CARE_PROVIDER_SITE_OTHER): Payer: Self-pay | Admitting: Pharmacist Clinician (PhC)/ Clinical Pharmacy Specialist

## 2020-02-14 DIAGNOSIS — I824Y9 Acute embolism and thrombosis of unspecified deep veins of unspecified proximal lower extremity: Secondary | ICD-10-CM

## 2020-02-14 DIAGNOSIS — I2699 Other pulmonary embolism without acute cor pulmonale: Secondary | ICD-10-CM

## 2020-02-14 DIAGNOSIS — E119 Type 2 diabetes mellitus without complications: Secondary | ICD-10-CM | POA: Insufficient documentation

## 2020-02-14 DIAGNOSIS — I509 Heart failure, unspecified: Secondary | ICD-10-CM | POA: Insufficient documentation

## 2020-02-14 DIAGNOSIS — J849 Interstitial pulmonary disease, unspecified: Secondary | ICD-10-CM | POA: Insufficient documentation

## 2020-02-14 DIAGNOSIS — Z8616 Personal history of COVID-19: Secondary | ICD-10-CM | POA: Insufficient documentation

## 2020-02-14 DIAGNOSIS — I110 Hypertensive heart disease with heart failure: Secondary | ICD-10-CM | POA: Insufficient documentation

## 2020-02-14 DIAGNOSIS — Z7901 Long term (current) use of anticoagulants: Secondary | ICD-10-CM

## 2020-02-14 LAB — POCT INR: INR: 2.9 (ref 2.0–3.0)

## 2020-02-14 LAB — ECHOCARDIOGRAM COMPLETE

## 2020-02-14 NOTE — Progress Notes (Signed)
Echocardiogram 2D Echocardiogram has been performed.  Oneal Deputy Eluzer Howdeshell 02/14/2020, 8:47 AM

## 2020-02-14 NOTE — Progress Notes (Signed)
Electrophysiology Office Note Date: 02/15/2020  ID:  Jonathan Dickson, Jonathan Dickson August 28, 1956, MRN [id number]  PCP: Ladell Pier, MD Primary Cardiologist: Quay Burow, MD Electrophysiologist: [name], MD   CC: Routine ICD follow-up  Jonathan Dickson is a 64 y.o. male seen today for Will Meredith Leeds, MD for routine electrophysiology followup.  Since last being seen in our clinic the patient reports doing very well overall. Mild SOB with activity, but does his job without difficulty. Tolerating tafamadis and followed by CHF team. He denies chest pain, palpitations, PND, orthopnea, nausea, vomiting, dizziness, syncope, edema, weight gain, or early satiety. He has not had ICD shocks.   Device History: St. Jude Single Chamber ICD implanted 05/2018 for cardiomyopathy History of appropriate therapy: No History of AAD therapy: No   Past Medical History:  Diagnosis Date  . Abnormal TSH 02/2016  . Anxiety   . Cervical disc disease   . Chronic combined systolic and diastolic CHF (congestive heart failure) ([place])    a. 01/2016 Echo: EF 25%, inf AK, diffuse sev HK, Gr1 DD, mild MR.  . Depression   . History of hiatal hernia   . Hyperglycemia   . Hypertension   . Hypertensive heart disease   . Myocardial infarction [facility])    ??  maybe  . NICM (nonischemic cardiomyopathy) ([place])    a. 01/2016 Echo: EF 25%, inf AK, diffuse sev HK, Gr1 DD, mild MR; b. 01/2016 MV: EF 22%, no isch/infarct;  c. 02/2016 Cath: Nl cors, EF 35-45%.  . Pneumonia   . Pre-diabetes   . Seizures ([place])    with penicillin   Past Surgical History:  Procedure Laterality Date  . ANTERIOR CERVICAL DECOMP/DISCECTOMY FUSION N/A 07/10/2016   Procedure: ANTERIOR CERVICAL DECOMPRESSION FUSION CERVICAL 4-5, CERVICAL 5-6, CERVICAL 6-7 WITH INSTRUMENTATION AND ALLOGRAFT;  Surgeon: [name], MD;  Location: [facility];  Service: Orthopedics;  Laterality: N/A;  . CARDIAC CATHETERIZATION N/A 03/11/2016   Procedure: Left Heart Cath  and Coronary Angiography;  Surgeon: [name], MD;  Location: [facility];  Service: Cardiovascular;  Laterality: N/A;  . ICD IMPLANT N/A 06/17/2018   Procedure: ICD IMPLANT;  Surgeon: [name], MD;  Location: [place] [facility];  Service: Cardiovascular;  Laterality: N/A;  . Thumb surgery Right   . VENTRAL HERNIA REPAIR N/A 10/25/2019   Procedure: PRIMARY VENTRAL HERNIA REPAIR;  Surgeon: [name], MD;  Location: [facility];  Service: General;  Laterality: N/A;    Current Outpatient Medications  Medication Sig Dispense Refill  . acetaminophen (TYLENOL) 500 MG tablet Take 1,000 mg by mouth every 6 (six) hours as needed for moderate pain or headache.    . albuterol (VENTOLIN HFA) 108 (90 Base) MCG/ACT inhaler Inhale 2 puffs into the lungs every 6 (six) hours as needed for wheezing or shortness of breath. 8 g 2  . amitriptyline (ELAVIL) 25 MG tablet TAKE 1 TABLET(25 MG) BY MOUTH AT BEDTIME AS NEEDED FOR SLEEP 30 tablet 0  . Aromatic Inhalants (VICKS VAPOINHALER) INHA Inhale 1 Dose into the lungs daily as needed (congestion).    Marland Kitchen atorvastatin (LIPITOR) 40 MG tablet Take 1 tablet (40 mg total) by mouth daily. 90 tablet 3  . carvedilol (COREG) 25 MG tablet TAKE 1 TABLET(25 MG) BY MOUTH TWICE DAILY WITH A MEAL 180 tablet 2  . Chlorphen-Phenyleph-ASA (ALKA-SELTZER PLUS COLD PO) Take 1-2 tablets by mouth 2 (two) times daily as needed (cold symptoms).     Marland Kitchen  Cholecalciferol (VITAMIN D) 50 MCG (2000 UT) tablet Take 2,000 Units by mouth daily.    . dapagliflozin propanediol (FARXIGA) 10 MG TABS tablet Take 10 mg by mouth daily before breakfast. 30 tablet 5  . diclofenac Sodium (VOLTAREN) 1 % GEL Apply 2 g topically 4 (four) times daily. 100 g 1  . hydrALAZINE (APRESOLINE) 50 MG tablet Take 1 tablet (50 mg total) by mouth 3 (three) times daily. 270 tablet 0  . isosorbide mononitrate (IMDUR) 30 MG 24 hr tablet Take 1 tablet (30 mg total) by mouth at bedtime. 90 tablet 3  .  metFORMIN (GLUCOPHAGE) 500 MG tablet Take 1 tablet (500 mg total) by mouth daily with breakfast. 90 tablet 3  . methocarbamol (ROBAXIN) 500 MG tablet Take 1 tablet (500 mg total) by mouth every 8 (eight) hours as needed for muscle spasms. 15 tablet 0  . pseudoephedrine (SUDAFED) 30 MG tablet Take 30 mg by mouth every 4 (four) hours as needed for congestion.    . sacubitril-valsartan (ENTRESTO) 97-103 MG Take 1 tablet by mouth 2 (two) times daily. 60 tablet 6  . sildenafil (VIAGRA) 50 MG tablet TAKE 1 TABLET BY MOUTH EVERY DAIY AS NEEDED FOR ERECTILE DYSFUNCTION 10 tablet 2  . spironolactone (ALDACTONE) 25 MG tablet Take 0.5 tablets (12.5 mg total) by mouth daily. 15 tablet 11  . Tafamidis (VYNDAMAX) 61 MG CAPS Take 61 mg by mouth daily. 30 capsule 11  . warfarin (COUMADIN) 5 MG tablet Take 1 to 2 tablets by mouth daily as directed 90 tablet 0  . furosemide (LASIX) 40 MG tablet Take 1 tablet (40 mg total) by mouth daily as needed for fluid (if weight gain more than 3 punds). 30 tablet 0   No current facility-administered medications for this visit.    Allergies:   Peanut-containing drug products, Penicillins, and Decadron [dexamethasone]   Social History: Social History   Socioeconomic History  . Marital status: Legally Separated    Spouse name: Not on file  . Number of children: 3  . Years of education: Not on file  . Highest education level: Not on file  Occupational History  . Not on file  Tobacco Use  . Smoking status: Former Smoker    Packs/day: 1.50    Years: 8.00    Pack years: 12.00    Quit date: 03/15/2003    Years since quitting: 16.9  . Smokeless tobacco: Never Used  Substance and Sexual Activity  . Alcohol use: No    Comment: none since 2004  . Drug use: No    Comment: former  none since 2004  . Sexual activity: Not Currently  Other Topics Concern  . Not on file  Social History Narrative  . Not on file   Social Determinants of Health   Financial Resource  Strain:   . Difficulty of Paying Living Expenses:   Food Insecurity:   . Worried About Charity fundraiser in the Last Year:   . Arboriculturist in the Last Year:   Transportation Needs:   . Film/video editor (Medical):   Marland Kitchen Lack of Transportation (Non-Medical):   Physical Activity:   . Days of Exercise per Week:   . Minutes of Exercise per Session:   Stress:   . Feeling of Stress :   Social Connections:   . Frequency of Communication with Friends and Family:   . Frequency of Social Gatherings with Friends and Family:   . Attends Religious Services:   .  Active Member of Clubs or Organizations:   . Attends Archivist Meetings:   Marland Kitchen Marital Status:   Intimate Partner Violence:   . Fear of Current or Ex-Partner:   . Emotionally Abused:   Marland Kitchen Physically Abused:   . Sexually Abused:     Family History: Family History  Problem Relation Age of Onset  . Diabetes Mother   . Diabetes Sister     Review of Systems: All other systems reviewed and are otherwise negative except as noted above.   Physical Exam: Vitals:   02/15/20 0758  BP: (!) 92/58  Pulse: 81  SpO2: 98%  Weight: 164 lb 6.4 oz (74.6 kg)  Height: 5\' 6"  (1.676 m)     GEN- The patient is well appearing, alert and oriented x 3 today.   HEENT: normocephalic, atraumatic; sclera clear, conjunctiva pink; hearing intact; oropharynx clear; neck supple, no JVP Lymph- no cervical lymphadenopathy Lungs- Clear to ausculation bilaterally, normal work of breathing.  No wheezes, rales, rhonchi Heart- Regular rate and rhythm, no murmurs, rubs or gallops, PMI not laterally displaced GI- soft, non-tender, non-distended, bowel sounds present, no hepatosplenomegaly Extremities- no clubbing, cyanosis, or edema; DP/PT/radial pulses 2+ bilaterally MS- no significant deformity or atrophy Skin- warm and dry, no rash or lesion; ICD pocket well healed Psych- euthymic mood, full affect Neuro- strength and sensation are  intact  ICD interrogation- reviewed in detail today,  See PACEART report  EKG:  EKG is not ordered today. The ekg ordered 10/11/2019 shows sinus tachycardia at 101 bpm, PR interval 144 ms, QRS 100 ms  Recent Labs: 09/25/2019: Magnesium 2.1 11/04/2019: ALT 25 02/09/2020: B Natriuretic Peptide 315.9; BUN 9; Creatinine, Ser 0.92; Hemoglobin 13.4; Platelets 212; Potassium 4.3; Sodium 137   Wt Readings from Last 3 Encounters:  02/15/20 164 lb 6.4 oz (74.6 kg)  02/09/20 167 lb 9.6 oz (76 kg)  11/16/19 177 lb (80.3 kg)     Other studies Reviewed: Additional studies/ records that were reviewed today include: Previous remotes, Previous EP office notes, most recent labwork, Previous HF clinic notes.   Assessment and Plan:  1.  Chronic systolic dysfunction s/p St. Jude single chamber ICD  euvolemic today Stable on an appropriate medical regimen followed by HF team NYHA II-III symptoms PYP and cMRI testing strongly suggestive of TTR amyloid. No M-spike, normal IFE on myeloma panel Normal ICD function See Pace Art report No changes today Echo 5/17 with EF 35-40%. Slightly improved from 08/2019.  2. DMII Now on farxiga  3. HTN Continue current regimen  4. PE CTA 04/2018 with large bilateral PE Failed Eliquis with acute LLE DVT 11/20 Will need lifelong [place].  VQ scan pending to look for chronic PE.  Current medicines are reviewed at length with the patient today.   The patient does not have concerns regarding his medicines.  The following changes were made today:  none  Labs/ tests ordered today include:  Orders Placed This Encounter  Procedures  . CUP PACEART INCLINIC DEVICE CHECK   Disposition:   Follow up with Dr. Curt Bears in 9 months for continued ICD follow up.   Jacalyn Lefevre, PA-C  02/15/2020 8:23 AM  [address] [address] [phone] (office) [phone] (fax) [place]

## 2020-02-15 ENCOUNTER — Encounter: Payer: Self-pay | Admitting: Student

## 2020-02-15 ENCOUNTER — Ambulatory Visit (INDEPENDENT_AMBULATORY_CARE_PROVIDER_SITE_OTHER): Payer: Self-pay | Admitting: Student

## 2020-02-15 VITALS — BP 92/58 | HR 81 | Ht 66.0 in | Wt 164.4 lb

## 2020-02-15 DIAGNOSIS — E854 Organ-limited amyloidosis: Secondary | ICD-10-CM

## 2020-02-15 DIAGNOSIS — I43 Cardiomyopathy in diseases classified elsewhere: Secondary | ICD-10-CM

## 2020-02-15 DIAGNOSIS — I5022 Chronic systolic (congestive) heart failure: Secondary | ICD-10-CM

## 2020-02-15 DIAGNOSIS — Z7901 Long term (current) use of anticoagulants: Secondary | ICD-10-CM

## 2020-02-15 DIAGNOSIS — I824Y9 Acute embolism and thrombosis of unspecified deep veins of unspecified proximal lower extremity: Secondary | ICD-10-CM

## 2020-02-15 DIAGNOSIS — I2699 Other pulmonary embolism without acute cor pulmonale: Secondary | ICD-10-CM

## 2020-02-15 LAB — CUP PACEART INCLINIC DEVICE CHECK
Battery Remaining Longevity: 81 mo
Brady Statistic RV Percent Paced: 0 %
Date Time Interrogation Session: 20210518082050
HighPow Impedance: 67.5 Ohm
Implantable Lead Implant Date: 20190918
Implantable Lead Location: 753860
Implantable Lead Model: 7122
Implantable Pulse Generator Implant Date: 20190918
Lead Channel Impedance Value: 450 Ohm
Lead Channel Pacing Threshold Amplitude: 0.5 V
Lead Channel Pacing Threshold Amplitude: 0.5 V
Lead Channel Pacing Threshold Pulse Width: 0.5 ms
Lead Channel Pacing Threshold Pulse Width: 0.5 ms
Lead Channel Sensing Intrinsic Amplitude: 12 mV
Lead Channel Setting Pacing Amplitude: 2.5 V
Lead Channel Setting Pacing Pulse Width: 0.5 ms
Lead Channel Setting Sensing Sensitivity: 0.5 mV
Pulse Gen Serial Number: 9796641

## 2020-02-15 NOTE — Patient Instructions (Addendum)
Medication Instructions:  NONE *If you need a refill on your cardiac medications before your next appointment, please call your pharmacy*   Lab Work: NONE If you have labs (blood work) drawn today and your tests are completely normal, you will receive your results only by: Marland Kitchen MyChart Message (if you have MyChart) OR . A paper copy in the mail If you have any lab test that is abnormal or we need to change your treatment, we will call you to review the results.   Testing/Procedures: NONE   Follow-Up: At [facility], you and your health needs are our priority.  As part of our continuing mission to provide you with exceptional heart care, we have created designated Provider Care Teams.  These Care Teams include your primary Cardiologist (physician) and Advanced Practice Providers (APPs -  Physician Assistants and Nurse Practitioners) who all work together to provide you with the care you need, when you need it.   Your next appointment:   9 months  The format for your next appointment:   Either In Person or Virtual  Provider:   Dr Curt Bears   Other Instructions Remote monitoring is used to monitor your ICD from home. This monitoring reduces the number of office visits required to check your device to one time per year. It allows [place] to keep an eye on the functioning of your device to ensure it is working properly. You are scheduled for a device check from home on 03/28/20. You may send your transmission at any time that day. If you have a wireless device, the transmission will be sent automatically. After your physician reviews your transmission, you will receive a postcard with your next transmission date.

## 2020-02-16 MED FILL — VYNDAMAX 61 MG CAPS: 61 | 30 days supply | Qty: 30 | Fill #4

## 2020-03-08 ENCOUNTER — Encounter: Payer: Self-pay | Admitting: Podiatry

## 2020-03-08 ENCOUNTER — Other Ambulatory Visit: Payer: Self-pay

## 2020-03-08 ENCOUNTER — Ambulatory Visit (INDEPENDENT_AMBULATORY_CARE_PROVIDER_SITE_OTHER): Payer: Medicare Other | Admitting: Podiatry

## 2020-03-08 DIAGNOSIS — M79675 Pain in left toe(s): Secondary | ICD-10-CM

## 2020-03-08 DIAGNOSIS — D689 Coagulation defect, unspecified: Secondary | ICD-10-CM

## 2020-03-08 DIAGNOSIS — B351 Tinea unguium: Secondary | ICD-10-CM

## 2020-03-08 DIAGNOSIS — M79674 Pain in right toe(s): Secondary | ICD-10-CM

## 2020-03-08 DIAGNOSIS — E119 Type 2 diabetes mellitus without complications: Secondary | ICD-10-CM

## 2020-03-08 DIAGNOSIS — I739 Peripheral vascular disease, unspecified: Secondary | ICD-10-CM | POA: Diagnosis not present

## 2020-03-08 NOTE — Progress Notes (Signed)
This patient returns to my office for at risk foot care.  This patient requires this care by a professional since this patient will be at risk due to havingcoagulation defect, diabetes and peripheral claudication.  Patient is taking coumadin.  This patient is unable to cut nails himself since the patient cannot reach his nails.These nails are painful walking and wearing shoes.  This patient presents for at risk foot care today.  General Appearance  Alert, conversant and in no acute stress.  Vascular  Dorsalis pedis and posterior tibial  pulses are palpable  bilaterally.  Capillary return is within normal limits  bilaterally. Temperature is within normal limits  bilaterally.  Neurologic  Senn-Weinstein monofilament wire test within normal limits  bilaterally. Muscle power within normal limits bilaterally.  Nails Thick disfigured discolored nails with subungual debris  from hallux to fifth toes bilaterally. No evidence of bacterial infection or drainage bilaterally.  Orthopedic  No limitations of motion  feet .  No crepitus or effusions noted.  No bony pathology or digital deformities noted.  Skin  normotropic skin with no porokeratosis noted bilaterally.  No signs of infections or ulcers noted.     Onychomycosis  Pain in right toes  Pain in left toes  Consent was obtained for treatment procedures.   Mechanical debridement of nails 1-5  bilaterally performed with a nail nipper.  Filed with dremel without incident.    Return office visit   4 months                   Told patient to return for periodic foot care and evaluation due to potential at risk complications.   Landin Tallon DPM  

## 2020-03-14 ENCOUNTER — Telehealth (HOSPITAL_COMMUNITY): Payer: Self-pay | Admitting: Pharmacy Technician

## 2020-03-14 NOTE — Telephone Encounter (Signed)
Advanced Heart Failure Patient Advocate Encounter  Prior Authorization for Delene Loll has been approved. Patient recently switched from Medicaid to Medicare as of 06/01.  PA# [id number] Effective dates: 03/13/20 through 09/29/20  Patients co-pay is $0.00  Charlann Boxer, CPhT

## 2020-03-19 ENCOUNTER — Other Ambulatory Visit: Payer: Self-pay | Admitting: Cardiovascular Disease

## 2020-03-20 MED FILL — VYNDAMAX 61 MG CAPS: 61 | 30 days supply | Qty: 30 | Fill #5

## 2020-03-27 ENCOUNTER — Ambulatory Visit (INDEPENDENT_AMBULATORY_CARE_PROVIDER_SITE_OTHER): Payer: Medicare Other | Admitting: Pharmacist

## 2020-03-27 ENCOUNTER — Other Ambulatory Visit: Payer: Self-pay

## 2020-03-27 DIAGNOSIS — I2699 Other pulmonary embolism without acute cor pulmonale: Secondary | ICD-10-CM

## 2020-03-27 DIAGNOSIS — Z7901 Long term (current) use of anticoagulants: Secondary | ICD-10-CM

## 2020-03-27 LAB — POCT INR: INR: 3.6 — AB (ref 2.0–3.0)

## 2020-03-28 ENCOUNTER — Ambulatory Visit (INDEPENDENT_AMBULATORY_CARE_PROVIDER_SITE_OTHER): Payer: Medicare Other | Admitting: *Deleted

## 2020-03-28 DIAGNOSIS — I428 Other cardiomyopathies: Secondary | ICD-10-CM | POA: Diagnosis not present

## 2020-03-28 LAB — CUP PACEART REMOTE DEVICE CHECK
Battery Remaining Longevity: 82 mo
Battery Remaining Percentage: 80 %
Battery Voltage: 2.99 V
Brady Statistic RV Percent Paced: 1 %
Date Time Interrogation Session: 20210629023200
HighPow Impedance: 68 Ohm
HighPow Impedance: 68 Ohm
Implantable Lead Implant Date: 20190918
Implantable Lead Location: 753860
Implantable Lead Model: 7122
Implantable Pulse Generator Implant Date: 20190918
Lead Channel Impedance Value: 460 Ohm
Lead Channel Pacing Threshold Amplitude: 0.5 V
Lead Channel Pacing Threshold Pulse Width: 0.5 ms
Lead Channel Sensing Intrinsic Amplitude: 12 mV
Lead Channel Setting Pacing Amplitude: 2.5 V
Lead Channel Setting Pacing Pulse Width: 0.5 ms
Lead Channel Setting Sensing Sensitivity: 0.5 mV
Pulse Gen Serial Number: 9796641

## 2020-03-30 NOTE — Progress Notes (Signed)
Remote ICD transmission.   

## 2020-04-16 ENCOUNTER — Encounter (HOSPITAL_COMMUNITY): Payer: Self-pay | Admitting: *Deleted

## 2020-04-16 ENCOUNTER — Other Ambulatory Visit: Payer: Self-pay

## 2020-04-16 ENCOUNTER — Emergency Department (HOSPITAL_COMMUNITY)
Admission: EM | Admit: 2020-04-16 | Discharge: 2020-04-17 | Disposition: A | Discharge status: Home / Self Care | Payer: Medicare Other | Attending: Emergency Medicine | Admitting: Emergency Medicine

## 2020-04-16 DIAGNOSIS — R202 Paresthesia of skin: Secondary | ICD-10-CM | POA: Diagnosis not present

## 2020-04-16 DIAGNOSIS — Z5321 Procedure and treatment not carried out due to patient leaving prior to being seen by health care provider: Secondary | ICD-10-CM | POA: Diagnosis not present

## 2020-04-16 DIAGNOSIS — M542 Cervicalgia: Secondary | ICD-10-CM | POA: Diagnosis not present

## 2020-04-16 DIAGNOSIS — M79629 Pain in unspecified upper arm: Secondary | ICD-10-CM | POA: Diagnosis not present

## 2020-04-16 DIAGNOSIS — M25519 Pain in unspecified shoulder: Secondary | ICD-10-CM | POA: Diagnosis not present

## 2020-04-16 NOTE — ED Triage Notes (Signed)
The pt is c/o neck lt shoulder and arm pain and numbness for 3-4 days feels like something pressing on it

## 2020-04-17 ENCOUNTER — Other Ambulatory Visit: Payer: Self-pay

## 2020-04-17 ENCOUNTER — Telehealth (HOSPITAL_COMMUNITY): Payer: Self-pay | Admitting: Vascular Surgery

## 2020-04-17 ENCOUNTER — Encounter (HOSPITAL_COMMUNITY): Payer: Self-pay | Admitting: Emergency Medicine

## 2020-04-17 ENCOUNTER — Emergency Department (HOSPITAL_COMMUNITY): Payer: Medicare Other

## 2020-04-17 ENCOUNTER — Emergency Department (HOSPITAL_COMMUNITY)
Admission: EM | Admit: 2020-04-17 | Discharge: 2020-04-17 | Disposition: A | Discharge status: Home / Self Care | Payer: Medicare Other | Source: Home / Self Care | Attending: Emergency Medicine | Admitting: Emergency Medicine

## 2020-04-17 DIAGNOSIS — Z87891 Personal history of nicotine dependence: Secondary | ICD-10-CM | POA: Insufficient documentation

## 2020-04-17 DIAGNOSIS — Z91010 Allergy to peanuts: Secondary | ICD-10-CM | POA: Insufficient documentation

## 2020-04-17 DIAGNOSIS — E119 Type 2 diabetes mellitus without complications: Secondary | ICD-10-CM | POA: Insufficient documentation

## 2020-04-17 DIAGNOSIS — I110 Hypertensive heart disease with heart failure: Secondary | ICD-10-CM | POA: Insufficient documentation

## 2020-04-17 DIAGNOSIS — Z79899 Other long term (current) drug therapy: Secondary | ICD-10-CM | POA: Insufficient documentation

## 2020-04-17 DIAGNOSIS — R0789 Other chest pain: Secondary | ICD-10-CM | POA: Insufficient documentation

## 2020-04-17 DIAGNOSIS — B029 Zoster without complications: Secondary | ICD-10-CM

## 2020-04-17 DIAGNOSIS — I5042 Chronic combined systolic (congestive) and diastolic (congestive) heart failure: Secondary | ICD-10-CM | POA: Insufficient documentation

## 2020-04-17 LAB — CBC
HCT: 42.9 % (ref 39.0–52.0)
Hemoglobin: 13.4 g/dL (ref 13.0–17.0)
MCH: 26 pg (ref 26.0–34.0)
MCHC: 31.2 g/dL (ref 30.0–36.0)
MCV: 83.3 fL (ref 80.0–100.0)
Platelets: 225 10*3/uL (ref 150–400)
RBC: 5.15 MIL/uL (ref 4.22–5.81)
RDW: 17.6 % — ABNORMAL HIGH (ref 11.5–15.5)
WBC: 7 10*3/uL (ref 4.0–10.5)
nRBC: 0 % (ref 0.0–0.2)

## 2020-04-17 LAB — BASIC METABOLIC PANEL
Anion gap: 11 (ref 5–15)
BUN: 17 mg/dL (ref 8–23)
CO2: 21 mmol/L — ABNORMAL LOW (ref 22–32)
Calcium: 9.6 mg/dL (ref 8.9–10.3)
Chloride: 106 mmol/L (ref 98–111)
Creatinine, Ser: 1.23 mg/dL (ref 0.61–1.24)
GFR calc Af Amer: >60 mL/min (ref >60)
GFR calc non Af Amer: >60 mL/min (ref >60)
Glucose, Bld: 103 mg/dL — ABNORMAL HIGH (ref 70–99)
Potassium: 4.6 mmol/L (ref 3.5–5.1)
Sodium: 138 mmol/L (ref 135–145)

## 2020-04-17 LAB — TROPONIN I (HIGH SENSITIVITY)
Troponin I (High Sensitivity): 14 ng/L (ref <18)
Troponin I (High Sensitivity): 13 ng/L (ref <18)

## 2020-04-17 MED ORDER — GABAPENTIN 100 MG PO CAPS
100.0000 mg | ORAL_CAPSULE | Freq: Once | ORAL | Status: AC
  Administered 2020-04-17: 100 mg via ORAL
  Filled 2020-04-17: qty 1

## 2020-04-17 MED ORDER — SODIUM CHLORIDE 0.9% FLUSH: 3.0000 mL | Freq: Once | INTRAVENOUS | Status: DC

## 2020-04-17 MED ORDER — PREDNISONE 20 MG PO TABS
60.0000 mg | ORAL_TABLET | Freq: Once | ORAL | Status: AC
  Administered 2020-04-17: 60 mg via ORAL
  Filled 2020-04-17: qty 3

## 2020-04-17 MED ORDER — PREDNISONE 20 MG PO TABS
ORAL_TABLET | ORAL | 0 refills | Status: DC
Start: 2020-04-17 — End: 2020-05-16

## 2020-04-17 MED ORDER — GABAPENTIN 100 MG PO CAPS
100.0000 mg | ORAL_CAPSULE | Freq: Three times a day (TID) | ORAL | 0 refills | Status: DC
Start: 2020-04-17 — End: 2020-08-16

## 2020-04-17 MED FILL — VYNDAMAX 61 MG CAPS: 61 | 30 days supply | Qty: 30 | Fill #6

## 2020-04-17 NOTE — ED Provider Notes (Addendum)
[facility] EMERGENCY DEPARTMENT Provider Note   CSN: [id number] Arrival date & time: 04/17/20  1355     History Chief Complaint  Patient presents with  . Chest Pain    Jonathan Dickson is a 64 y.o. male.  Patient describes 3 to 4 days of a sharp pain in his left neck left upper chest radiates down into his left lateral arm.  Patient states that it is atraumatic.  No recent infections either.  States he never had any this before it comes and goes and it feels like a lightning strike.  He has a defibrillator and was worried that maybe his defibrillator is firing.  Has no palpitations, lower chest pain, nausea, vomiting, dyspnea or lightheadedness.  Cannot recreate the pain by moving his neck in any certain way.  Initially thought maybe he slept on it wrong however it has persisted too long for it to be that.  He has no weakness in his hands no problems on the right side.   Chest Pain      Past Medical History:  Diagnosis Date  . Abnormal TSH 02/2016  . Anxiety   . Cervical disc disease   . Chronic combined systolic and diastolic CHF (congestive heart failure) ([place])    a. 01/2016 Echo: EF 25%, inf AK, diffuse sev HK, Gr1 DD, mild MR.  . Depression   . History of hiatal hernia   . Hyperglycemia   . Hypertension   . Hypertensive heart disease   . Myocardial infarction [facility])    ??  maybe  . NICM (nonischemic cardiomyopathy) ([place])    a. 01/2016 Echo: EF 25%, inf AK, diffuse sev HK, Gr1 DD, mild MR; b. 01/2016 MV: EF 22%, no isch/infarct;  c. 02/2016 Cath: Nl cors, EF 35-45%.  . Pneumonia   . Pre-diabetes   . Seizures ([place])    with penicillin    Patient Active Problem List   Diagnosis Date Noted  . Blood clotting disorder ([place]) 03/08/2020  . Long term (current) use of anticoagulants 08/06/2019  . DVT (deep venous thrombosis) ([place]) 08/04/2019  . Hypertensive retinopathy 06/30/2019  . Pain due to onychomycosis of toenails of both feet 05/28/2019  . Hyperlipidemia  associated with type 2 diabetes mellitus ([place]) 05/15/2019  . Type 2 diabetes mellitus without complication, without long-term current use of insulin ([place]) 05/13/2019  . 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 Chronic systolic heart failure ([place]) 06/17/2018  . Bilateral pulmonary embolism ([place]) 05/12/2018  . Heart failure ([place]) 05/11/2018  . Claudication in peripheral vascular disease ([place]) 12/31/2017  . Decreased hearing of both ears 11/10/2017  . Cervical disc disease with myelopathy 07/10/2016  . NICM (nonischemic cardiomyopathy) ([place]) 03/12/2016  . Congestive dilated cardiomyopathy ([place]) 03/11/2016  . Cervical radiculitis 04/19/2015  . Essential hypertension 09/27/2014    Past Surgical History:  Procedure Laterality Date  . ANTERIOR CERVICAL DECOMP/DISCECTOMY FUSION N/A 07/10/2016   Procedure: ANTERIOR CERVICAL DECOMPRESSION FUSION CERVICAL 4-5, CERVICAL 5-6, CERVICAL 6-7 WITH INSTRUMENTATION AND ALLOGRAFT;  Surgeon: [name], MD;  Location: [facility];  Service: Orthopedics;  Laterality: N/A;  . CARDIAC CATHETERIZATION N/A 03/11/2016   Procedure: Left Heart Cath and Coronary Angiography;  Surgeon: [name], MD;  Location: [facility];  Service: Cardiovascular;  Laterality: N/A;  . ICD IMPLANT N/A 06/17/2018   Procedure: ICD IMPLANT;  Surgeon: [name], MD;  Location: [facility];  Service: Cardiovascular;  Laterality: N/A;  . Thumb surgery Right   .  VENTRAL HERNIA REPAIR N/A 10/25/2019   Procedure: PRIMARY VENTRAL HERNIA REPAIR;  Surgeon: [name], MD;  Location: [facility];  Service: General;  Laterality: N/A;       Family History  Problem Relation Age of Onset  . Diabetes Mother   . Diabetes Sister     Social History   Tobacco Use  . Smoking status: Former Smoker    Packs/day: 1.50    Years: 8.00    Pack years: 12.00    Quit date: 03/15/2003    Years since quitting: 17.1  . Smokeless tobacco: Never Used  Vaping Use  . Vaping Use:  Never used  Substance Use Topics  . Alcohol use: No    Comment: none since 2004  . Drug use: No    Comment: former  none since 2004    Home Medications Prior to Admission medications   Medication Sig Start Date End Date Taking? Authorizing Provider  acetaminophen (TYLENOL) 500 MG tablet Take 1,000 mg by mouth every 6 (six) hours as needed for moderate pain or headache.    [provider]  albuterol (VENTOLIN HFA) 108 (90 Base) MCG/ACT inhaler Inhale 2 puffs into the lungs every 6 (six) hours as needed for wheezing or shortness of breath. 11/04/19   Ladell Pier, MD  amitriptyline (ELAVIL) 25 MG tablet TAKE 1 TABLET(25 MG) BY MOUTH AT BEDTIME AS NEEDED FOR SLEEP 10/15/19   Ladell Pier, MD  Aromatic Inhalants (VICKS VAPOINHALER) INHA Inhale 1 Dose into the lungs daily as needed (congestion).    [provider]  atorvastatin (LIPITOR) 40 MG tablet Take 1 tablet (40 mg total) by mouth daily. 11/16/19   Lorretta Harp, MD  carvedilol (COREG) 25 MG tablet TAKE 1 TABLET(25 MG) BY MOUTH TWICE DAILY WITH A MEAL 02/02/20   Lorretta Harp, MD  Chlorphen-Phenyleph-ASA (ALKA-SELTZER PLUS COLD PO) Take 1-2 tablets by mouth 2 (two) times daily as needed (cold symptoms).     [provider]  Cholecalciferol (VITAMIN D) 50 MCG (2000 UT) tablet Take 2,000 Units by mouth daily.    [provider]  dapagliflozin propanediol (FARXIGA) 10 MG TABS tablet Take 10 mg by mouth daily before breakfast. 02/09/20   Bensimhon, Shaune Pascal, MD  diclofenac Sodium (VOLTAREN) 1 % GEL Apply 2 g topically 4 (four) times daily. 02/04/20   Ladell Pier, MD  furosemide (LASIX) 40 MG tablet Take 1 tablet (40 mg total) by mouth daily as needed for fluid (if weight gain more than 3 punds). 05/14/18 02/09/20  Ghimire, Henreitta Leber, MD  gabapentin (NEURONTIN) 100 MG capsule Take 1 capsule (100 mg total) by mouth 3 (three) times daily. 04/17/20 05/17/20  Fenton Candee, Corene Cornea, MD  hydrALAZINE (APRESOLINE)  50 MG tablet Take 1 tablet (50 mg total) by mouth 3 (three) times daily. 01/27/20   Lorretta Harp, MD  isosorbide mononitrate (IMDUR) 30 MG 24 hr tablet Take 1 tablet (30 mg total) by mouth at bedtime. 10/11/19   Bensimhon, Shaune Pascal, MD  metFORMIN (GLUCOPHAGE) 500 MG tablet Take 1 tablet (500 mg total) by mouth daily with breakfast. 05/13/19   Ladell Pier, MD  methocarbamol (ROBAXIN) 500 MG tablet Take 1 tablet (500 mg total) by mouth every 8 (eight) hours as needed for muscle spasms. 09/17/19   Sherwood Gambler, MD  predniSONE (DELTASONE) 20 MG tablet 3 tabs po daily x 3 days, then 2 tabs x 3 days, then 1.5 tabs x 3 days, then 1 tab x  3 days, then 0.5 tabs x 3 days 04/17/20   Shashana Fullington, Corene Cornea, MD  pseudoephedrine (SUDAFED) 30 MG tablet Take 30 mg by mouth every 4 (four) hours as needed for congestion.    [provider]  sacubitril-valsartan (ENTRESTO) 97-103 MG Take 1 tablet by mouth 2 (two) times daily. 11/10/19   Bensimhon, Shaune Pascal, MD  sildenafil (VIAGRA) 50 MG tablet TAKE 1 TABLET BY MOUTH EVERY DAIY AS NEEDED FOR ERECTILE DYSFUNCTION 03/25/19   Argentina Donovan, PA-C  spironolactone (ALDACTONE) 25 MG tablet Take 0.5 tablets (12.5 mg total) by mouth daily. 06/03/19   Bensimhon, Shaune Pascal, MD  Tafamidis (VYNDAMAX) 61 MG CAPS Take 61 mg by mouth daily. 10/26/19   Bensimhon, Shaune Pascal, MD  warfarin (COUMADIN) 5 MG tablet TAKE 1 TO 2 TABLETS BY MOUTH DAILY AS DIRECTED 03/20/20   Lorretta Harp, MD    Allergies    Peanut-containing drug products, Penicillins, and Decadron [dexamethasone]  Review of Systems   Review of Systems  Cardiovascular: Positive for chest pain.  All other systems reviewed and are negative.   Physical Exam Updated Vital Signs BP (!) 149/80 (BP Location: Left Arm)   Pulse 85   Temp 98.1 F (36.7 C) (Oral)   Resp 16   Ht 5\' 6"  (1.676 m)   Wt 75.8 kg   SpO2 95%   BMI 26.95 kg/m   Physical Exam Vitals and nursing note reviewed.  Constitutional:       Appearance: He is well-developed.  HENT:     Head: Normocephalic and atraumatic.  Cardiovascular:     Rate and Rhythm: Normal rate.  Pulmonary:     Effort: Pulmonary effort is normal. No respiratory distress.  Abdominal:     General: There is no distension.  Musculoskeletal:        General: Normal range of motion.     Cervical back: Normal range of motion.  Skin:    General: Skin is warm and dry.     Findings: Rash (vesicular rash to upper back and chest on left side, see picture) present.  Neurological:     General: No focal deficit present.     Mental Status: He is alert.     Cranial Nerves: No cranial nerve deficit.     Motor: No weakness.     Comments: Patient states he feels tingling in his left upper chest and back when I touch it but can still feel me touching similar to the other side.  He has no obvious motor weakness and shoulder shrug, biceps, triceps, wrist flexion extension.       ED Results / Procedures / Treatments   Labs (all labs ordered are listed, but only abnormal results are displayed) Labs Reviewed  BASIC METABOLIC PANEL - Abnormal; Notable for the following components:      Result Value   CO2 21 (*)    Glucose, Bld 103 (*)    All other components within normal limits  CBC - Abnormal; Notable for the following components:   RDW 17.6 (*)    All other components within normal limits  TROPONIN I (HIGH SENSITIVITY)  TROPONIN I (HIGH SENSITIVITY)    EKG EKG Interpretation  Date/Time:  Monday April 17 2020 23:23:26 EDT Ventricular Rate:  76 PR Interval:    QRS Duration: 102 QT Interval:  390 QTC Calculation: 439 R Axis:   38 Text Interpretation: Sinus rhythm No significant change since last tracing Confirmed by Merrily Pew [phone]) on 04/17/2020 11:27:10 PM  Radiology DG Chest 2 View  Result Date: 04/17/2020 CLINICAL DATA:  Pt c/o left side chest pain described as numbness radiating to his neck and left arm since yesterday. EXAM: CHEST - 2  VIEW COMPARISON:  Chest radiograph 11/05/2019 FINDINGS: Stable cardiomediastinal contours left chest pacer remains in place. Minimal left basilar atelectasis. Right lung is clear. No pneumothorax or pleural effusion. No acute finding in the visualized skeleton. IMPRESSION: Minimal left basilar atelectasis.  No other acute finding. Electronically Signed   By: Audie Pinto M.D.   On: 04/17/2020 14:50    Procedures Procedures (including critical care time)  Medications Ordered in ED Medications  sodium chloride flush (NS) 0.9 % injection 3 mL (has no administration in time range)  predniSONE (DELTASONE) tablet 60 mg (has no administration in time range)  gabapentin (NEURONTIN) capsule 100 mg (has no administration in time range)    ED Course  I have reviewed the triage vital signs and the nursing notes.  Pertinent labs & imaging results that were available during my care of the patient were reviewed by me and considered in my medical decision making (see chart for details).    MDM Rules/Calculators/A&P                          Patient's rash is slightly difficult to see however does appear vesicular nature deftly stops at midline and is only in the areas where he feels a sharp pain making it consistent with likely shingles.  Does not change with any neck motions of the is unlikely to be central spinal or spinal nerve issue.  He has a normal upper extremity neuro exam making that unlikely as well.  We will start steroids and Neurontin and have him follow-up with his PCP.  Likely outside the window for antivirals to be helpful.  Final Clinical Impression(s) / ED Diagnoses Final diagnoses:  Herpes zoster without complication    Rx / DC Orders ED Discharge Orders         Ordered    predniSONE (DELTASONE) 20 MG tablet     Discontinue  Reprint     04/17/20 2330    gabapentin (NEURONTIN) 100 MG capsule  3 times daily     Discontinue  Reprint     04/17/20 2330           Donnell Wion,  Corene Cornea, MD 04/17/20 0375    Merrily Pew, MD 04/17/20 2338

## 2020-04-17 NOTE — ED Notes (Signed)
Pt states he is leaving °

## 2020-04-17 NOTE — ED Notes (Signed)
Patient verbalizes understanding of discharge instructions. Opportunity for questioning and answers were provided. Armband removed by staff, pt discharged from ED. Pt. ambulatory and discharged home.  

## 2020-04-17 NOTE — ED Triage Notes (Signed)
Pt c/o left side cp describe as numbness radiating to his neck and left arm since yesterday.

## 2020-04-17 NOTE — Telephone Encounter (Signed)
Pt left message on scheduling line to make appt w/ db, pt stated he was having numbness in arm and across his chest, I spoke to Triage and pt was advised to go to ED ASAP , PT stated he was there last night for over 3 hours and did not want to go back, but I still Advised pt to go to ED ASAP.

## 2020-04-24 ENCOUNTER — Ambulatory Visit (INDEPENDENT_AMBULATORY_CARE_PROVIDER_SITE_OTHER): Payer: Medicare Other | Admitting: Pharmacist Clinician (PhC)/ Clinical Pharmacy Specialist

## 2020-04-24 ENCOUNTER — Other Ambulatory Visit: Payer: Self-pay

## 2020-04-24 DIAGNOSIS — Z7901 Long term (current) use of anticoagulants: Secondary | ICD-10-CM | POA: Diagnosis not present

## 2020-04-24 DIAGNOSIS — I2699 Other pulmonary embolism without acute cor pulmonale: Secondary | ICD-10-CM | POA: Diagnosis not present

## 2020-04-24 LAB — POCT INR: INR: 5.6 — AB (ref 2.0–3.0)

## 2020-05-01 ENCOUNTER — Other Ambulatory Visit: Payer: Self-pay | Admitting: Cardiovascular Disease

## 2020-05-02 ENCOUNTER — Other Ambulatory Visit: Payer: Self-pay | Admitting: Cardiovascular Disease

## 2020-05-03 ENCOUNTER — Other Ambulatory Visit (HOSPITAL_COMMUNITY): Payer: Self-pay | Admitting: Internal Medicine

## 2020-05-03 ENCOUNTER — Ambulatory Visit (INDEPENDENT_AMBULATORY_CARE_PROVIDER_SITE_OTHER): Payer: Medicare Other

## 2020-05-03 ENCOUNTER — Other Ambulatory Visit: Payer: Self-pay

## 2020-05-03 DIAGNOSIS — I2699 Other pulmonary embolism without acute cor pulmonale: Secondary | ICD-10-CM

## 2020-05-03 DIAGNOSIS — Z7901 Long term (current) use of anticoagulants: Secondary | ICD-10-CM | POA: Diagnosis not present

## 2020-05-03 LAB — POCT INR: INR: 2.5 (ref 2.0–3.0)

## 2020-05-03 NOTE — Patient Instructions (Signed)
Continue taking 1.5 tables daily.  Repeat INR in 4 weeks

## 2020-05-15 MED FILL — VYNDAMAX 61 MG CAPS: 61 | 30 days supply | Qty: 30 | Fill #7

## 2020-05-16 ENCOUNTER — Ambulatory Visit (INDEPENDENT_AMBULATORY_CARE_PROVIDER_SITE_OTHER): Payer: Medicare Other | Admitting: Cardiovascular Disease

## 2020-05-16 ENCOUNTER — Encounter: Payer: Self-pay | Admitting: Cardiovascular Disease

## 2020-05-16 ENCOUNTER — Other Ambulatory Visit: Payer: Self-pay

## 2020-05-16 VITALS — BP 84/54 | HR 89 | Ht 66.0 in | Wt 165.8 lb

## 2020-05-16 DIAGNOSIS — E854 Organ-limited amyloidosis: Secondary | ICD-10-CM

## 2020-05-16 DIAGNOSIS — E1169 Type 2 diabetes mellitus with other specified complication: Secondary | ICD-10-CM | POA: Diagnosis not present

## 2020-05-16 DIAGNOSIS — Z95810 Presence of automatic (implantable) cardiac defibrillator: Secondary | ICD-10-CM | POA: Insufficient documentation

## 2020-05-16 DIAGNOSIS — I43 Cardiomyopathy in diseases classified elsewhere: Secondary | ICD-10-CM

## 2020-05-16 DIAGNOSIS — E785 Hyperlipidemia, unspecified: Secondary | ICD-10-CM | POA: Diagnosis not present

## 2020-05-16 DIAGNOSIS — I10 Essential (primary) hypertension: Secondary | ICD-10-CM | POA: Diagnosis not present

## 2020-05-16 DIAGNOSIS — I824Y9 Acute embolism and thrombosis of unspecified deep veins of unspecified proximal lower extremity: Secondary | ICD-10-CM

## 2020-05-16 LAB — HEPATIC FUNCTION PANEL
ALT: 17 IU/L (ref 0–44)
AST: 17 IU/L (ref 0–40)
Albumin: 4.2 g/dL (ref 3.8–4.8)
Alkaline Phosphatase: 91 IU/L (ref 48–121)
Bilirubin Total: 0.2 mg/dL (ref 0.0–1.2)
Bilirubin, Direct: 0.08 mg/dL (ref 0.00–0.40)
Total Protein: 6.6 g/dL (ref 6.0–8.5)

## 2020-05-16 LAB — LIPID PANEL
Chol/HDL Ratio: 3.7 ratio (ref 0.0–5.0)
Cholesterol, Total: 153 mg/dL (ref 100–199)
HDL: 41 mg/dL (ref >39)
LDL Chol Calc (NIH): 82 mg/dL (ref 0–99)
Triglycerides: 175 mg/dL — ABNORMAL HIGH (ref 0–149)
VLDL Cholesterol Cal: 30 mg/dL (ref 5–40)

## 2020-05-16 NOTE — Progress Notes (Signed)
05/16/2020 [name]   October 24, 1955  [id number]  Primary Physician Ladell Pier, MD Primary Cardiologist: Lorretta Harp MD Garret Reddish, [place], [place]  HPI:  Jonathan Dickson is a 64 y.o.  Marland Kitchenfit appearing widowed African-American male father of 62, grandfather to 3 grandchildren.  I last saw him in the office 12/04/2019.  He was scheduled to have cervical laminectomy today but this was put off because of need for cardiac clearance.His only risk factor is hypertension. Smoking 13 years ago. He's never had a heart attack or stroke. He denies chest pain or shortness of breath. I performed 2-D echocardiography which revealed unexpectedly ejection fraction of 25% with inferior wall akinesis and severe hypokinesia otherwise. A Myoview stress test confirmed the deep diminished ejection fraction but did not show any areas of ischemia or infarction. 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 He underwent right left heart cath by Dr. Ellyn Hack 03/11/16 confirming this. Ultimately, he is begun on carvedilol and lisinopril. His ejection fraction improved up to 35-40% by 2-D echocardiogram performed 06/21/16. He is completely symptomatic. Based on this I cleared him forfor hiselective cervical laminectomy and mildly elevated risk.  Because of her current symptoms of shortness of breath a repeat echo was performed 01/05/2018 revealing EF of 30%. I did refer him to [facility].His heart failure medications were optimized. He did have a cardiac MRI performed 04/01/2018 that showed an EF of 20% with changes consistent with cardiac amyloidosis and he was begun on medications for this. He also had an ICD implanted by Dr. Curt Bears prophylactically 06/07/2018 and has had no discharges. He feels clinically improved on his current medications. He has had bilateral pulmonary emboli as well on Eliquis oral anticoagulation.  He complained of some left calf pain and had a venous ultrasound done at [facility] remarkable for a left popliteal DVT which was acute. He was hospitalized and deemed in Eliquis failure. Lovenox was begun as was Coumadin oral anticoagulation. Unfortunately, he left the hospital AMA . He is apparently been compliant with his other medications. He denies symptoms of heart failure. He is not seen Dr. Haroldine Laws back since January.  Since I saw him 6 months ago he continues to do well.  He is somewhat hypertensive today but is asymptomatic.  He has seen Dr. Haroldine Laws in the office 02/09/2020.  He is on appropriate guideline directed medical therapy for his nonischemic cardiomyopathy.  He does get short of breath on occasion but is the most part fairly stable.   Current Meds  Medication Sig  . acetaminophen (TYLENOL) 500 MG tablet Take 1,000 mg by mouth every 6 (six) hours as needed for moderate pain or headache.  . albuterol (VENTOLIN HFA) 108 (90 Base) MCG/ACT inhaler Inhale 2 puffs into the lungs every 6 (six) hours as needed for wheezing or shortness of breath.  Marland Kitchen amitriptyline (ELAVIL) 25 MG tablet TAKE 1 TABLET(25 MG) BY MOUTH AT BEDTIME AS NEEDED FOR SLEEP  . Aromatic Inhalants (VICKS VAPOINHALER) INHA Inhale 1 Dose into the lungs daily as needed (congestion).  Marland Kitchen atorvastatin (LIPITOR) 40 MG tablet Take 1 tablet (40 mg total) by mouth daily.  . carvedilol (COREG) 25 MG tablet TAKE 1 TABLET(25 MG) BY MOUTH TWICE DAILY WITH A MEAL  . Chlorphen-Phenyleph-ASA (ALKA-SELTZER PLUS COLD PO) Take 1-2 tablets by mouth 2 (two) times daily as needed (cold symptoms).   . Cholecalciferol (VITAMIN D) 50 MCG (2000 UT) tablet Take 2,000 Units by mouth daily.  Marland Kitchen  dapagliflozin propanediol (FARXIGA) 10 MG TABS tablet Take 10 mg by mouth daily before breakfast.  . diclofenac Sodium (VOLTAREN) 1 % GEL Apply 2 g topically 4 (four) times daily.  . furosemide (LASIX) 40 MG tablet Take 1 tablet (40 mg total) by mouth daily as needed for fluid (if weight gain more than 3 punds).  .  gabapentin (NEURONTIN) 100 MG capsule Take 1 capsule (100 mg total) by mouth 3 (three) times daily.  . hydrALAZINE (APRESOLINE) 50 MG tablet TAKE 1 TABLET(50 MG) BY MOUTH THREE TIMES DAILY  . isosorbide mononitrate (IMDUR) 30 MG 24 hr tablet Take 1 tablet (30 mg total) by mouth at bedtime.  . metFORMIN (GLUCOPHAGE) 500 MG tablet Take 1 tablet (500 mg total) by mouth daily with breakfast.  . methocarbamol (ROBAXIN) 500 MG tablet Take 1 tablet (500 mg total) by mouth every 8 (eight) hours as needed for muscle spasms.  . pseudoephedrine (SUDAFED) 30 MG tablet Take 30 mg by mouth every 4 (four) hours as needed for congestion.  . sacubitril-valsartan (ENTRESTO) 97-103 MG Take 1 tablet by mouth 2 (two) times daily.  . sildenafil (VIAGRA) 50 MG tablet TAKE 1 TABLET BY MOUTH EVERY DAIY AS NEEDED FOR ERECTILE DYSFUNCTION  . spironolactone (ALDACTONE) 25 MG tablet TAKE 1/2 TABLET(12.5 MG) BY MOUTH DAILY  . Tafamidis (VYNDAMAX) 61 MG CAPS Take 61 mg by mouth daily.  Marland Kitchen warfarin (COUMADIN) 5 MG tablet TAKE 1 TO 2 TABLETS BY MOUTH DAILY AS DIRECTED     Allergies  Allergen Reactions  . Peanut-Containing Drug Products Swelling  . Penicillins Other (See Comments)    CONVULSIONS Did it involve swelling of the face/tongue/throat, SOB, or low BP? No Did it involve sudden or severe rash/hives, skin peeling, or any reaction on the inside of your mouth or nose? No Did you need to seek medical attention at a hospital or doctor's office? Yes When did it last happen?25 years If all above answers are "NO", may proceed with cephalosporin use.   . Decadron [Dexamethasone] Itching    Social History   Socioeconomic History  . Marital status: Legally Separated    Spouse name: Not on file  . Number of children: 3  . Years of education: Not on file  . Highest education level: Not on file  Occupational History  . Not on file  Tobacco Use  . Smoking status: Former Smoker    Packs/day: 1.50    Years: 8.00      Pack years: 12.00    Quit date: 03/15/2003    Years since quitting: 17.1  . Smokeless tobacco: Never Used  Vaping Use  . Vaping Use: Never used  Substance and Sexual Activity  . Alcohol use: No    Comment: none since 2004  . Drug use: No    Comment: former  none since 2004  . Sexual activity: Not Currently  Other Topics Concern  . Not on file  Social History Narrative  . Not on file   Social Determinants of Health   Financial Resource Strain:   . Difficulty of Paying Living Expenses:   Food Insecurity:   . Worried About Charity fundraiser in the Last Year:   . Arboriculturist in the Last Year:   Transportation Needs:   . Film/video editor (Medical):   Marland Kitchen Lack of Transportation (Non-Medical):   Physical Activity:   . Days of Exercise per Week:   . Minutes of Exercise per Session:   Stress:   .  Feeling of Stress :   Social Connections:   . Frequency of Communication with Friends and Family:   . Frequency of Social Gatherings with Friends and Family:   . Attends Religious Services:   . Active Member of Clubs or Organizations:   . Attends Archivist Meetings:   Marland Kitchen Marital Status:   Intimate Partner Violence:   . Fear of Current or Ex-Partner:   . Emotionally Abused:   Marland Kitchen Physically Abused:   . Sexually Abused:      Review of Systems: General: negative for chills, fever, night sweats or weight changes.  Cardiovascular: negative for chest pain, dyspnea on exertion, edema, orthopnea, palpitations, paroxysmal nocturnal dyspnea or shortness of breath Dermatological: negative for rash Respiratory: negative for cough or wheezing Urologic: negative for hematuria Abdominal: negative for nausea, vomiting, diarrhea, bright red blood per rectum, melena, or hematemesis Neurologic: negative for visual changes, syncope, or dizziness All other systems reviewed and are otherwise negative except as noted above.    Blood pressure (!) 84/54, pulse 89, height 5\' 6"   (1.676 m), weight 165 lb 12.8 oz (75.2 kg), SpO2 97 %.  General appearance: alert and no distress Neck: no adenopathy, no carotid bruit, no JVD, supple, symmetrical, trachea midline and thyroid not enlarged, symmetric, no tenderness/mass/nodules Lungs: clear to auscultation bilaterally Heart: regular rate and rhythm, S1, S2 normal, no murmur, click, rub or gallop Extremities: extremities normal, atraumatic, no cyanosis or edema Pulses: 2+ and symmetric Skin: Skin color, texture, turgor normal. No rashes or lesions Neurologic: Alert and oriented X 3, normal strength and tone. Normal symmetric reflexes. Normal coordination and gait  EKG not performed today  ASSESSMENT AND PLAN:   Essential hypertension History of essential hypertension with blood pressure measured of 84/54.  He is on Entresto and carvedilol, as well as hydralazine.  Congestive dilated cardiomyopathy [facility]) She has nonischemic cardiomyopathy demonstrated by cardiac catheterization performed by Dr. Ellyn Hack 03/11/2016.  He does have changes consistent with cardiac amyloidosis and is on appropriate medications, followed by Dr. Haroldine Laws.  He appears euvolemic today.  Cardiac amyloidosis ([place]) Followed by Dr. Haroldine Laws on tafamidis.  Hyperlipidemia associated with type 2 diabetes mellitus ([place]) History of hyperlipidemia on atorvastatin 40 mg a day with lipid profile performed 05/13/2019 revealing total cholesterol 230, LDL 131 HDL 39.  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  ICD (implantable cardioverter-defibrillator) in place History of ICD implantation by Dr. Curt Bears 06/07/2018 followed quarterly by [facility].  He is not seen Dr. Curt Bears since 12/19.  Not had any discharges.  We will arrange outpatient follow-up.      Lorretta Harp MD FACP,FACC,FAHA, [facility] 05/16/2020 8:07 AM

## 2020-05-16 NOTE — Assessment & Plan Note (Signed)
History of ICD implantation by Dr. Curt Bears 06/07/2018 followed quarterly by [facility].  He is not seen Dr. Curt Bears since 12/19.  Not had any discharges.  We will arrange outpatient follow-up.

## 2020-05-16 NOTE — Assessment & Plan Note (Signed)
History of hyperlipidemia on atorvastatin 40 mg a day with lipid profile performed 05/13/2019 revealing total cholesterol 230, LDL 131 HDL 39.

## 2020-05-16 NOTE — Assessment & Plan Note (Signed)
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

## 2020-05-16 NOTE — Assessment & Plan Note (Signed)
History of essential hypertension with blood pressure measured of 84/54.  He is on Entresto and carvedilol, as well as hydralazine.

## 2020-05-16 NOTE — Patient Instructions (Signed)
Medication Instructions:  The current medical regimen is effective;  continue present plan and medications.  *If you need a refill on your cardiac medications before your next appointment, please call your pharmacy*   Lab Work: LIPID/LIVER lab today   If you have labs (blood work) drawn today and your tests are completely normal, you will receive your results only by: Marland Kitchen MyChart Message (if you have MyChart) OR . A paper copy in the mail If you have any lab test that is abnormal or we need to change your treatment, we will call you to review the results.   Follow-Up: At [facility], you and your health needs are our priority.  As part of our continuing mission to provide you with exceptional heart care, we have created designated Provider Care Teams.  These Care Teams include your primary Cardiologist (physician) and Advanced Practice Providers (APPs -  Physician Assistants and Nurse Practitioners) who all work together to provide you with the care you need, when you need it.  We recommend signing up for the patient portal called "MyChart".  Sign up information is provided on this After Visit Summary.  MyChart is used to connect with patients for Virtual Visits (Telemedicine).  Patients are able to view lab/test results, encounter notes, upcoming appointments, etc.  Non-urgent messages can be sent to your provider as well.   To learn more about what you can do with MyChart, go to [email or web address].    Your next appointment:   12 month(s)  The format for your next appointment:   In Person  Provider:   Quay Burow, MD   Other Instructions Please call to make appointment with [facility], you are overdue.

## 2020-05-16 NOTE — Assessment & Plan Note (Signed)
Followed by Dr. Haroldine Laws on tafamidis.

## 2020-05-16 NOTE — Assessment & Plan Note (Signed)
She has nonischemic cardiomyopathy demonstrated by cardiac catheterization performed by Dr. Ellyn Hack 03/11/2016.  He does have changes consistent with cardiac amyloidosis and is on appropriate medications, followed by Dr. Haroldine Laws.  He appears euvolemic today.

## 2020-05-18 ENCOUNTER — Other Ambulatory Visit: Payer: Self-pay

## 2020-05-18 DIAGNOSIS — E1169 Type 2 diabetes mellitus with other specified complication: Secondary | ICD-10-CM

## 2020-05-18 DIAGNOSIS — Z79899 Other long term (current) drug therapy: Secondary | ICD-10-CM

## 2020-05-18 DIAGNOSIS — E785 Hyperlipidemia, unspecified: Secondary | ICD-10-CM

## 2020-05-18 MED ORDER — ATORVASTATIN CALCIUM 80 MG PO TABS: 80.0000 mg | ORAL_TABLET | Freq: Every day | ORAL | 6 refills | Status: DC

## 2020-05-31 ENCOUNTER — Other Ambulatory Visit (HOSPITAL_COMMUNITY): Payer: Self-pay | Admitting: Internal Medicine

## 2020-05-31 ENCOUNTER — Other Ambulatory Visit: Payer: Self-pay

## 2020-05-31 ENCOUNTER — Ambulatory Visit (INDEPENDENT_AMBULATORY_CARE_PROVIDER_SITE_OTHER): Payer: Medicare Other

## 2020-05-31 DIAGNOSIS — Z7901 Long term (current) use of anticoagulants: Secondary | ICD-10-CM

## 2020-05-31 DIAGNOSIS — I2699 Other pulmonary embolism without acute cor pulmonale: Secondary | ICD-10-CM

## 2020-05-31 LAB — POCT INR: INR: 2.6 (ref 2.0–3.0)

## 2020-05-31 NOTE — Patient Instructions (Signed)
Continue taking 1.5 tables daily.  Repeat INR in 6 weeks  

## 2020-06-04 ENCOUNTER — Other Ambulatory Visit (HOSPITAL_COMMUNITY): Payer: Self-pay | Admitting: Internal Medicine

## 2020-06-12 MED FILL — VYNDAMAX 61 MG CAPS: 61 | 30 days supply | Qty: 30 | Fill #8

## 2020-06-14 ENCOUNTER — Other Ambulatory Visit: Payer: Self-pay | Admitting: Cardiovascular Disease

## 2020-06-15 ENCOUNTER — Other Ambulatory Visit: Payer: Self-pay | Admitting: Internal Medicine

## 2020-06-15 DIAGNOSIS — G4700 Insomnia, unspecified: Secondary | ICD-10-CM

## 2020-06-15 LAB — HM DIABETES EYE EXAM

## 2020-06-22 ENCOUNTER — Other Ambulatory Visit: Payer: Self-pay | Admitting: Internal Medicine

## 2020-06-22 DIAGNOSIS — E119 Type 2 diabetes mellitus without complications: Secondary | ICD-10-CM

## 2020-06-27 ENCOUNTER — Ambulatory Visit (INDEPENDENT_AMBULATORY_CARE_PROVIDER_SITE_OTHER): Payer: Medicare Other | Admitting: Emergency Medicine

## 2020-06-27 DIAGNOSIS — I428 Other cardiomyopathies: Secondary | ICD-10-CM | POA: Diagnosis not present

## 2020-06-27 LAB — CUP PACEART REMOTE DEVICE CHECK
Battery Remaining Longevity: 79 mo
Battery Remaining Percentage: 78 %
Battery Voltage: 2.99 V
Brady Statistic RV Percent Paced: 1 %
Date Time Interrogation Session: 20210928020015
HighPow Impedance: 62 Ohm
HighPow Impedance: 62 Ohm
Implantable Lead Implant Date: 20190918
Implantable Lead Location: 753860
Implantable Lead Model: 7122
Implantable Pulse Generator Implant Date: 20190918
Lead Channel Impedance Value: 450 Ohm
Lead Channel Pacing Threshold Amplitude: 0.5 V
Lead Channel Pacing Threshold Pulse Width: 0.5 ms
Lead Channel Sensing Intrinsic Amplitude: 12 mV
Lead Channel Setting Pacing Amplitude: 2.5 V
Lead Channel Setting Pacing Pulse Width: 0.5 ms
Lead Channel Setting Sensing Sensitivity: 0.5 mV
Pulse Gen Serial Number: 9796641

## 2020-06-30 NOTE — Progress Notes (Signed)
Remote ICD transmission.   

## 2020-07-03 ENCOUNTER — Telehealth: Payer: Self-pay

## 2020-07-03 ENCOUNTER — Other Ambulatory Visit: Payer: Self-pay | Admitting: Orthopedic Surgery

## 2020-07-03 DIAGNOSIS — M5412 Radiculopathy, cervical region: Secondary | ICD-10-CM

## 2020-07-03 NOTE — Telephone Encounter (Signed)
Phone call to patient to verify medication list and allergies for myelogram procedure. Pt instructed to hold Elavil for 48hrs prior to myelogram appointment time and 24 hours after appointment. Pt also instructed to have a driver the day of the procedure, the procedure would take around 2 hours, and discharge instructions discussed. Pt verbalized understanding.

## 2020-07-06 MED FILL — VYNDAMAX 61 MG CAPS: 61 | 30 days supply | Qty: 30 | Fill #9

## 2020-07-11 ENCOUNTER — Other Ambulatory Visit: Payer: Self-pay | Admitting: Orthopedic Surgery

## 2020-07-11 ENCOUNTER — Ambulatory Visit
Admission: RE | Admit: 2020-07-11 | Discharge: 2020-07-11 | Disposition: A | Discharge status: Home / Self Care | Payer: Medicare Other | Source: Ambulatory Visit | Attending: Orthopedic Surgery | Admitting: Orthopedic Surgery

## 2020-07-11 ENCOUNTER — Other Ambulatory Visit: Payer: Self-pay

## 2020-07-11 ENCOUNTER — Ambulatory Visit
Admission: RE | Admit: 2020-07-11 | Discharge: 2020-07-11 | Disposition: A | Discharge status: Home / Self Care | Payer: Self-pay | Source: Ambulatory Visit | Attending: Orthopedic Surgery | Admitting: Orthopedic Surgery

## 2020-07-11 DIAGNOSIS — M5412 Radiculopathy, cervical region: Secondary | ICD-10-CM

## 2020-07-11 DIAGNOSIS — R52 Pain, unspecified: Secondary | ICD-10-CM

## 2020-07-11 MED ORDER — DIAZEPAM 5 MG PO TABS: 5.0000 mg | ORAL_TABLET | Freq: Once | ORAL | Status: DC

## 2020-07-11 MED ORDER — MEPERIDINE HCL 50 MG/ML IJ SOLN
50.0000 mg | Freq: Once | INTRAMUSCULAR | Status: AC
  Administered 2020-07-11: 50 mg via INTRAMUSCULAR

## 2020-07-11 MED ORDER — ONDANSETRON HCL 4 MG/2ML IJ SOLN
4.0000 mg | Freq: Once | INTRAMUSCULAR | Status: AC
  Administered 2020-07-11: 4 mg via INTRAMUSCULAR

## 2020-07-11 MED ORDER — IOPAMIDOL (ISOVUE-M 200) INJECTION 41%
10.0000 mL | Freq: Once | INTRAMUSCULAR | Status: AC
  Administered 2020-07-11: 10 mL via INTRATHECAL

## 2020-07-11 NOTE — Progress Notes (Signed)
Pt reports he has been off of his Elavil for at least 48 hours.

## 2020-07-11 NOTE — Discharge Instructions (Signed)

## 2020-07-14 ENCOUNTER — Other Ambulatory Visit: Payer: Self-pay

## 2020-07-14 ENCOUNTER — Ambulatory Visit (INDEPENDENT_AMBULATORY_CARE_PROVIDER_SITE_OTHER): Payer: Medicare Other

## 2020-07-14 DIAGNOSIS — I2699 Other pulmonary embolism without acute cor pulmonale: Secondary | ICD-10-CM

## 2020-07-14 DIAGNOSIS — Z7901 Long term (current) use of anticoagulants: Secondary | ICD-10-CM | POA: Diagnosis not present

## 2020-07-14 LAB — POCT INR: INR: 3.7 — AB (ref 2.0–3.0)

## 2020-07-14 NOTE — Patient Instructions (Signed)
Hold tomorrow and then Continue taking 1.5 tables daily.  Repeat INR in 4 weeks

## 2020-07-19 ENCOUNTER — Encounter: Payer: Self-pay | Admitting: Podiatry

## 2020-07-19 ENCOUNTER — Other Ambulatory Visit: Payer: Self-pay

## 2020-07-19 ENCOUNTER — Ambulatory Visit (INDEPENDENT_AMBULATORY_CARE_PROVIDER_SITE_OTHER): Payer: Medicare Other | Admitting: Podiatry

## 2020-07-19 DIAGNOSIS — B351 Tinea unguium: Secondary | ICD-10-CM | POA: Diagnosis not present

## 2020-07-19 DIAGNOSIS — M79674 Pain in right toe(s): Secondary | ICD-10-CM

## 2020-07-19 DIAGNOSIS — D689 Coagulation defect, unspecified: Secondary | ICD-10-CM | POA: Diagnosis not present

## 2020-07-19 DIAGNOSIS — E119 Type 2 diabetes mellitus without complications: Secondary | ICD-10-CM

## 2020-07-19 DIAGNOSIS — M79675 Pain in left toe(s): Secondary | ICD-10-CM

## 2020-07-19 NOTE — Progress Notes (Signed)
This patient returns to my office for at risk foot care.  This patient requires this care by a professional since this patient will be at risk due to havingcoagulation defect, diabetes and peripheral claudication.  Patient is taking coumadin.  This patient is unable to cut nails himself since the patient cannot reach his nails.These nails are painful walking and wearing shoes.  This patient presents for at risk foot care today.  General Appearance  Alert, conversant and in no acute stress.  Vascular  Dorsalis pedis and posterior tibial  pulses are palpable  bilaterally.  Capillary return is within normal limits  bilaterally. Temperature is within normal limits  bilaterally.  Neurologic  Senn-Weinstein monofilament wire test within normal limits  bilaterally. Muscle power within normal limits bilaterally.  Nails Thick disfigured discolored nails with subungual debris  from hallux to fifth toes bilaterally. No evidence of bacterial infection or drainage bilaterally.  Orthopedic  No limitations of motion  feet .  No crepitus or effusions noted.  No bony pathology or digital deformities noted.  Skin  normotropic skin with no porokeratosis noted bilaterally.  No signs of infections or ulcers noted.     Onychomycosis  Pain in right toes  Pain in left toes  Consent was obtained for treatment procedures.   Mechanical debridement of nails 1-5  bilaterally performed with a nail nipper.  Filed with dremel without incident.    Return office visit   4 months                   Told patient to return for periodic foot care and evaluation due to potential at risk complications.   Gardiner Barefoot DPM

## 2020-07-20 ENCOUNTER — Telehealth: Payer: Self-pay | Admitting: Cardiovascular Disease

## 2020-07-20 ENCOUNTER — Other Ambulatory Visit: Payer: Self-pay | Admitting: Internal Medicine

## 2020-07-20 DIAGNOSIS — R062 Wheezing: Secondary | ICD-10-CM

## 2020-07-20 NOTE — Telephone Encounter (Signed)
Normal remote transmission received 07/20/20. No episodes. Noted.

## 2020-07-20 NOTE — Telephone Encounter (Signed)
Spoke with the pt per Dr. Oval Linsey the [name] and she advises the pt needs to be seen... pt to see Almyra Deforest PA 07/21/20 11:45 am... pt will have someone take him to the ED if his symptoms reoccur, persist, or worsen and not drive himself.   Pt sent a remote transmission and will be sure no arrhythmia on his ICD.

## 2020-07-20 NOTE — Telephone Encounter (Signed)
Pt c/o Shortness Of Breath: STAT if SOB developed within the last 24 hours or pt is noticeably SOB on the phone  1. Are you currently SOB (can you hear that pt is SOB on the phone)? Yes, cannot hear over phone  2. How long have you been experiencing SOB? Since December when he had Covid   3. Are you SOB when sitting or when up moving around? Moving around and bending over   4. Are you currently experiencing any other symptoms? Blacked out this morning, and slight CP  Pt c/o of Chest Pain: STAT if CP now or developed within 24 hours  1. Are you having CP right now? No   2. Are you experiencing any other symptoms (ex. SOB, nausea, vomiting, sweating)? SOB   3. How long have you been experiencing CP? Occurred for the first time today   4. Is your CP continuous or coming and going? Continuous, when occurred   5. Have you taken Nitroglycerin? No  ? Is taking 6 capsules of gabapentin (NEURONTIN) 300 MG capsule a day. Blacked out this morning due to SOB and CP occurred after.

## 2020-07-20 NOTE — Telephone Encounter (Signed)
Pt called from [facility] and he was with [place]... the service coordinators office... he was doing some exertional work this morning that required him to bend down to pick up items repetitively;y and he got light headed and everything went " black: and he had is not sure if he fully lossed consciousness or not but he fell to the ground without injury.   He has been having increased SOB the last few days and after the episode his chest felt very tight. He did not hit his chest.   Pt says he is okay now but still having some SOB.   He ate breakfast this morning around 6 am and took his meds and the episode was between 10-11 am.    Pt checked his BP 132/84 and HR 75.   Pt to send remote transmission of his ICD.   Will talk with Dr. Oval Linsey the [name].

## 2020-07-21 ENCOUNTER — Other Ambulatory Visit: Payer: Self-pay

## 2020-07-21 ENCOUNTER — Encounter: Payer: Self-pay | Admitting: Physician Assistant

## 2020-07-21 ENCOUNTER — Ambulatory Visit (INDEPENDENT_AMBULATORY_CARE_PROVIDER_SITE_OTHER): Payer: Medicare Other | Admitting: Physician Assistant

## 2020-07-21 VITALS — BP 124/80 | HR 81 | Ht 66.0 in | Wt 176.4 lb

## 2020-07-21 DIAGNOSIS — I428 Other cardiomyopathies: Secondary | ICD-10-CM

## 2020-07-21 DIAGNOSIS — I43 Cardiomyopathy in diseases classified elsewhere: Secondary | ICD-10-CM

## 2020-07-21 DIAGNOSIS — I10 Essential (primary) hypertension: Secondary | ICD-10-CM

## 2020-07-21 DIAGNOSIS — R079 Chest pain, unspecified: Secondary | ICD-10-CM

## 2020-07-21 DIAGNOSIS — Z95810 Presence of automatic (implantable) cardiac defibrillator: Secondary | ICD-10-CM

## 2020-07-21 DIAGNOSIS — R55 Syncope and collapse: Secondary | ICD-10-CM | POA: Diagnosis not present

## 2020-07-21 DIAGNOSIS — I2699 Other pulmonary embolism without acute cor pulmonale: Secondary | ICD-10-CM

## 2020-07-21 DIAGNOSIS — E854 Organ-limited amyloidosis: Secondary | ICD-10-CM

## 2020-07-21 DIAGNOSIS — I5022 Chronic systolic (congestive) heart failure: Secondary | ICD-10-CM | POA: Diagnosis not present

## 2020-07-21 MED ORDER — HYDRALAZINE HCL 25 MG PO TABS: 25.0000 mg | ORAL_TABLET | Freq: Three times a day (TID) | ORAL | 0 refills | Status: DC

## 2020-07-21 NOTE — Patient Instructions (Signed)
Medication Instructions:  Start Hydralazine 25mg  (1 Tablet Three (3) Times Daily *If you need a refill on your cardiac medications before your next appointment, please call your pharmacy*   Lab Work: CBC,Cmp,BNP,Pt/INR If you have labs (blood work) drawn today and your tests are completely normal, you will receive your results only by: Marland Kitchen MyChart Message (if you have MyChart) OR . A paper copy in the mail If you have any lab test that is abnormal or we need to change your treatment, we will call you to review the results.   Testing/Procedures: None Ordered   Follow-Up: At [facility], you and your health needs are our priority.  As part of our continuing mission to provide you with exceptional heart care, we have created designated Provider Care Teams.  These Care Teams include your primary Cardiologist (physician) and Advanced Practice Providers (APPs -  Physician Assistants and Nurse Practitioners) who all work together to provide you with the care you need, when you need it.  Your next appointment:   3 week(s)  The format for your next appointment:   In Person  Provider:  Almyra Deforest PA-C

## 2020-07-21 NOTE — Progress Notes (Signed)
Cardiology Office Note:    Date:  07/23/2020   ID:  Jonathan Dickson, DOB 1956-09-19, MRN [id number]  PCP:  Patient, No Pcp Per  [facility] Cardiologist:  Quay Burow, MD  [facility] Electrophysiologist:  Will Meredith Leeds, MD   Referring MD: Ladell Pier, MD   Chief Complaint  Patient presents with  . Follow-up    seen for Dr. [name]    History of Present Illness:    Jonathan Dickson is a 64 y.o. male with a hx of hypertension, prediabetes, bilateral PE/DVT failed Eliquis, cardiac amyloidosis and history of nonischemic cardiomyopathy s/p ICD.  He was diagnosed with nonischemic cardiomyopathy in May 2017.  Echocardiogram obtained as part of cardiac clearance came back on 02/05/2016 with unexpected result of EF 25%, grade 1 DD, mild MR.  Myoview showed EF 22%, overall high risk study without ischemia or infarction.  Patient ultimately underwent cardiac catheterization on 03/11/2016 which showed angiographically normal coronary arteries with codominant system, mild to moderately elevated LVEDP.  He was placed on carvedilol and lisinopril.  Repeat echocardiogram by September 2017 showed EF has improved to 35 to 40%.  He was referred to heart failure service in 2019, heart failure medication were optimized.  Cardiac MRI performed on 04/01/2018 showed EF 20% with changes consistent with cardiac amyloidosis and he was started on medication for this.  ICD was implanted by Dr. Curt Bears prophylactically on 06/07/2018.  He had bilateral PE and was placed on Eliquis, however he ended up developing a left popliteal DVT while on the Eliquis.  Eliquis was eventually switched to Coumadin due to failure.  He was last seen by Dr. [name] on 0/86/7619, systolic blood pressure was in the 80s at the time.  He was on carvedilol and Entresto.  Patient presents today for evaluation of syncope.  This occurred yesterday.  He was doing repetitive work to pick up produce off of the floor.  After bending over and  getting up multiple times, he started having some dizzy spell.  Symptom eventually worsened to the degree that he decided to get back to the room to sit down.  After he sat down he felt the entire room was spinning.  He subsequently lost consciousness for roughly 30 seconds.  He was helped by other people living at the independent facility.  After he woke up he had roughly an hour of chest discomfort.  He also mentions to me that he has been having some increased dyspnea on exertion for the past 8 months.  On exam however he appears to be euvolemic.  Recent lab work shows supratherapeutic INR.  I recommended lab work including CMP, CBC, PT/INR and BNP.  I discussed the case with Dr. Nechama Guard [name], we will hold off on repeat echocardiogram at this point since the recent echo was reassuring.  His chest pain was atypical and given history of normal coronary artery on the previous cardiac catheterization, we decided to hold off on ischemic evaluation.  Orthostatic vital sign today reveals the following blood pressure.  I suspect his blood pressure is chronically low and further exacerbated by repetitive bending over and standing up.  I reduced his hydralazine to 25 mg 3 times daily.  We may further decide to increase Entresto on the next follow-up if blood pressure remain low.  Past Medical History:  Diagnosis Date  . Abnormal TSH 02/2016  . Anxiety   . Cervical disc disease   . Chronic combined systolic and diastolic CHF (congestive heart  failure) ([place])    a. 01/2016 Echo: EF 25%, inf AK, diffuse sev HK, Gr1 DD, mild MR.  . Depression   . History of hiatal hernia   . Hyperglycemia   . Hypertension   . Hypertensive heart disease   . Myocardial infarction [facility])    ??  maybe  . NICM (nonischemic cardiomyopathy) ([place])    a. 01/2016 Echo: EF 25%, inf AK, diffuse sev HK, Gr1 DD, mild MR; b. 01/2016 MV: EF 22%, no isch/infarct;  c. 02/2016 Cath: Nl cors, EF 35-45%.  . Pneumonia   . Pre-diabetes   . Seizures ([place])     with penicillin    Past Surgical History:  Procedure Laterality Date  . ANTERIOR CERVICAL DECOMP/DISCECTOMY FUSION N/A 07/10/2016   Procedure: ANTERIOR CERVICAL DECOMPRESSION FUSION CERVICAL 4-5, CERVICAL 5-6, CERVICAL 6-7 WITH INSTRUMENTATION AND ALLOGRAFT;  Surgeon: [name], MD;  Location: [facility];  Service: Orthopedics;  Laterality: N/A;  . CARDIAC CATHETERIZATION N/A 03/11/2016   Procedure: Left Heart Cath and Coronary Angiography;  Surgeon: [name], MD;  Location: [facility];  Service: Cardiovascular;  Laterality: N/A;  . ICD IMPLANT N/A 06/17/2018   Procedure: ICD IMPLANT;  Surgeon: [name], MD;  Location: [facility];  Service: Cardiovascular;  Laterality: N/A;  . Thumb surgery Right   . VENTRAL HERNIA REPAIR N/A 10/25/2019   Procedure: PRIMARY VENTRAL HERNIA REPAIR;  Surgeon: [name], MD;  Location: [facility];  Service: General;  Laterality: N/A;    Current Medications: Current Meds  Medication Sig  . acetaminophen (TYLENOL) 500 MG tablet Take 1,000 mg by mouth every 6 (six) hours as needed for moderate pain or headache.  . albuterol (VENTOLIN HFA) 108 (90 Base) MCG/ACT inhaler INHALE 2 PUFFS INTO THE LUNGS EVERY 6 HOURS AS NEEDED FOR WHEEZING OR SHORTNESS OF BREATH  . amitriptyline (ELAVIL) 25 MG tablet TAKE 1 TABLET(25 MG) BY MOUTH AT BEDTIME AS NEEDED FOR SLEEP  . Aromatic Inhalants (VICKS VAPOINHALER) INHA Inhale 1 Dose into the lungs daily as needed (congestion).  Marland Kitchen atorvastatin (LIPITOR) 80 MG tablet Take 1 tablet (80 mg total) by mouth daily.  . carvedilol (COREG) 25 MG tablet TAKE 1 TABLET(25 MG) BY MOUTH TWICE DAILY WITH A MEAL  . Chlorphen-Phenyleph-ASA (ALKA-SELTZER PLUS COLD PO) Take 1-2 tablets by mouth 2 (two) times daily as needed (cold symptoms).   . Cholecalciferol (VITAMIN D) 50 MCG (2000 UT) tablet Take 2,000 Units by mouth daily.  . dapagliflozin propanediol (FARXIGA) 10 MG TABS tablet Take 10 mg by mouth daily  before breakfast.  . diclofenac Sodium (VOLTAREN) 1 % GEL Apply 2 g topically 4 (four) times daily.  Marland Kitchen ENTRESTO 97-103 MG TAKE 1 TABLET BY MOUTH TWICE DAILY  . gabapentin (NEURONTIN) 300 MG capsule Take by mouth.  Marland Kitchen HYDROcodone-acetaminophen (NORCO/VICODIN) 5-325 MG tablet Take 1 tablet by mouth 2 (two) times daily as needed.  . isosorbide mononitrate (IMDUR) 30 MG 24 hr tablet Take 1 tablet (30 mg total) by mouth at bedtime.  . metFORMIN (GLUCOPHAGE) 500 MG tablet TAKE 1 TABLET(500 MG) BY MOUTH DAILY WITH BREAKFAST  . methocarbamol (ROBAXIN) 500 MG tablet Take 1 tablet (500 mg total) by mouth every 8 (eight) hours as needed for muscle spasms.  . pseudoephedrine (SUDAFED) 30 MG tablet Take 30 mg by mouth every 4 (four) hours as needed for congestion.  . sildenafil (VIAGRA) 50 MG tablet TAKE 1 TABLET BY MOUTH EVERY DAIY AS NEEDED FOR ERECTILE DYSFUNCTION  .  spironolactone (ALDACTONE) 25 MG tablet TAKE 1/2 TABLET(12.5 MG) BY MOUTH DAILY  . Tafamidis (VYNDAMAX) 61 MG CAPS Take 61 mg by mouth daily.  Marland Kitchen warfarin (COUMADIN) 5 MG tablet TAKE 1 TO 2 TABLETS BY MOUTH DAILY AS DIRECTED  . [DISCONTINUED] hydrALAZINE (APRESOLINE) 50 MG tablet TAKE 1 TABLET(50 MG) BY MOUTH THREE TIMES DAILY     Allergies:   Peanut-containing drug products, Penicillins, and Decadron [dexamethasone]   Social History   Socioeconomic History  . Marital status: Legally Separated    Spouse name: Not on file  . Number of children: 3  . Years of education: Not on file  . Highest education level: Not on file  Occupational History  . Not on file  Tobacco Use  . Smoking status: Former Smoker    Packs/day: 1.50    Years: 8.00    Pack years: 12.00    Quit date: 03/15/2003    Years since quitting: 17.3  . Smokeless tobacco: Never Used  Vaping Use  . Vaping Use: Never used  Substance and Sexual Activity  . Alcohol use: No    Comment: none since 2004  . Drug use: No    Comment: former  none since 2004  . Sexual  activity: Not Currently  Other Topics Concern  . Not on file  Social History Narrative  . Not on file   Social Determinants of Health   Financial Resource Strain:   . Difficulty of Paying Living Expenses: Not on file  Food Insecurity:   . Worried About Charity fundraiser in the Last Year: Not on file  . Ran Out of Food in the Last Year: Not on file  Transportation Needs:   . Lack of Transportation (Medical): Not on file  . Lack of Transportation (Non-Medical): Not on file  Physical Activity:   . Days of Exercise per Week: Not on file  . Minutes of Exercise per Session: Not on file  Stress:   . Feeling of Stress : Not on file  Social Connections:   . Frequency of Communication with Friends and Family: Not on file  . Frequency of Social Gatherings with Friends and Family: Not on file  . Attends Religious Services: Not on file  . Active Member of Clubs or Organizations: Not on file  . Attends Archivist Meetings: Not on file  . Marital Status: Not on file     Family History: The patient's family history includes Diabetes in his mother and sister.  ROS:   Please see the history of present illness.     All other systems reviewed and are negative.  EKGs/Labs/Other Studies Reviewed:    The following studies were reviewed today:  Echo 02/14/2020 1. Diffuse hypokinesis worse in the septum and inferior base EF similar  to TTE done November 2020. Left ventricular ejection fraction, by  estimation, is 35 to 40%. The left ventricle has moderately decreased  function. The left ventricle demonstrates  global hypokinesis. Left ventricular diastolic parameters were normal.  2. Pacing wires in RA/RV. Right ventricular systolic function is normal.  The right ventricular size is normal. There is normal pulmonary artery  systolic pressure.  3. The mitral valve is normal in structure. Trivial mitral valve  regurgitation. No evidence of mitral stenosis.  4. The aortic valve  is normal in structure. Aortic valve regurgitation is  not visualized. No aortic stenosis is present.  5. The inferior vena cava is normal in size with greater than 50%  respiratory  variability, suggesting right atrial pressure of 3 mmHg.   EKG:  EKG is ordered today.  The ekg ordered today demonstrates normal sinus rhythm, no significant ST-T wave changes  Recent Labs: 09/25/2019: Magnesium 2.1 02/09/2020: B Natriuretic Peptide 315.9 07/21/2020: ALT 18; BUN 22; Creatinine, Ser 1.37; Hemoglobin 12.7; NT-Pro BNP 64; Platelets 190; Potassium 5.0; Sodium 133  Recent Lipid Panel    Component Value Date/Time   CHOL 153 05/16/2020 0840   TRIG 175 (H) 05/16/2020 0840   HDL 41 05/16/2020 0840   CHOLHDL 3.7 05/16/2020 0840   CHOLHDL 2.9 10/04/2014 1508   VLDL 17 10/04/2014 1508   LDLCALC 82 05/16/2020 0840     Risk Assessment/Calculations:       Physical Exam:    VS:  BP 124/80   Pulse 81   Ht 5\' 6"  (1.676 m)   Wt 176 lb 6.4 oz (80 kg)   SpO2 98%   BMI 28.47 kg/m     Wt Readings from Last 3 Encounters:  07/21/20 176 lb 6.4 oz (80 kg)  05/16/20 165 lb 12.8 oz (75.2 kg)  04/17/20 167 lb (75.8 kg)     GEN:  Well nourished, well developed in no acute distress HEENT: Normal NECK: No JVD; No carotid bruits LYMPHATICS: No lymphadenopathy CARDIAC: RRR, no murmurs, rubs, gallops RESPIRATORY:  Clear to auscultation without rales, wheezing or rhonchi  ABDOMEN: Soft, non-tender, non-distended MUSCULOSKELETAL:  No edema; No deformity  SKIN: Warm and dry NEUROLOGIC:  Alert and oriented x 3 PSYCHIATRIC:  Normal affect   ASSESSMENT:    1. Syncope and collapse   2. Chronic systolic heart failure (HCC)   3. Pulmonary embolism, bilateral ([place])   4. Essential hypertension   5. Chest pain of uncertain etiology   6. Cardiac amyloidosis ([place])   7. NICM (nonischemic cardiomyopathy) ([place])   8. ICD (implantable cardioverter-defibrillator) in place    PLAN:    In order of problems  listed above:  1. History of syncope: Likely from recurrent bending over and then standing up resulting repeated orthostatic hypotension.  He has baseline blood pressure has been low in the 80s.  I discussed case with [name] Dr. Nechama Guard, we will reduce his hydralazine to 25 mg 3 times a day.  I plan to bring the patient back in a few weeks for follow-up, and may consider further reduce his medication during the next office visit.  Obtain lab work.  2. Chronic systolic heart failure: Euvolemic on exam  3. History of PE: Compliant with Coumadin therapy  4. Hypertension: He has been hypotensive recently due to the fact he is on multiple heart failure medications.  5. History of cardiac amyloidosis: on tafamidis  6. Nonischemic cardiomyopathy: Previous cardiac catheterization in 2017 showed normal coronary arteries  7. History of ICD: For nonischemic cardiomyopathy.  Followed by Dr. Curt Bears   Medication Adjustments/Labs and Tests Ordered: Current medicines are reviewed at length with the patient today.  Concerns regarding medicines are outlined above.  Orders Placed This Encounter  Procedures  . Comprehensive metabolic panel  . CBC  . Protime-INR  . Pro b natriuretic peptide (BNP)[id number])  . EKG 12-Lead   Meds ordered this encounter  Medications  . hydrALAZINE (APRESOLINE) 25 MG tablet    Sig: Take 1 tablet (25 mg total) by mouth 3 (three) times daily.    Dispense:  90 tablet    Refill:  0    Patient Instructions  Medication Instructions:  Start Hydralazine 25mg  (1  Tablet Three (3) Times Daily *If you need a refill on your cardiac medications before your next appointment, please call your pharmacy*   Lab Work: CBC,Cmp,BNP,Pt/INR If you have labs (blood work) drawn today and your tests are completely normal, you will receive your results only by: Marland Kitchen MyChart Message (if you have MyChart) OR . A paper copy in the mail If you have any lab test that is  abnormal or we need to change your treatment, we will call you to review the results.   Testing/Procedures: None Ordered   Follow-Up: At [facility], you and your health needs are our priority.  As part of our continuing mission to provide you with exceptional heart care, we have created designated Provider Care Teams.  These Care Teams include your primary Cardiologist (physician) and Advanced Practice Providers (APPs -  Physician Assistants and Nurse Practitioners) who all work together to provide you with the care you need, when you need it.  Your next appointment:   3 week(s)  The format for your next appointment:   In Person  Provider:  Almyra Deforest PA-C       Signed, Almyra Deforest, [place]  07/23/2020 11:02 PM    [place]

## 2020-07-22 LAB — COMPREHENSIVE METABOLIC PANEL
ALT: 18 IU/L (ref 0–44)
AST: 23 IU/L (ref 0–40)
Albumin/Globulin Ratio: 1.7 (ref 1.2–2.2)
Albumin: 4.5 g/dL (ref 3.8–4.8)
Alkaline Phosphatase: 75 IU/L (ref 44–121)
BUN/Creatinine Ratio: 16 (ref 10–24)
BUN: 22 mg/dL (ref 8–27)
Bilirubin Total: <0.2 mg/dL (ref 0.0–1.2)
CO2: 15 mmol/L — ABNORMAL LOW (ref 20–29)
Calcium: 9.2 mg/dL (ref 8.6–10.2)
Chloride: 104 mmol/L (ref 96–106)
Creatinine, Ser: 1.37 mg/dL — ABNORMAL HIGH (ref 0.76–1.27)
GFR calc Af Amer: 62 mL/min/{1.73_m2} (ref >59)
GFR calc non Af Amer: 54 mL/min/{1.73_m2} — ABNORMAL LOW (ref >59)
Globulin, Total: 2.6 g/dL (ref 1.5–4.5)
Glucose: 95 mg/dL (ref 65–99)
Potassium: 5 mmol/L (ref 3.5–5.2)
Sodium: 133 mmol/L — ABNORMAL LOW (ref 134–144)
Total Protein: 7.1 g/dL (ref 6.0–8.5)

## 2020-07-22 LAB — CBC
Hematocrit: 39.3 % (ref 37.5–51.0)
Hemoglobin: 12.7 g/dL — ABNORMAL LOW (ref 13.0–17.7)
MCH: 26.5 pg — ABNORMAL LOW (ref 26.6–33.0)
MCHC: 32.3 g/dL (ref 31.5–35.7)
MCV: 82 fL (ref 79–97)
Platelets: 190 10*3/uL (ref 150–450)
RBC: 4.8 x10E6/uL (ref 4.14–5.80)
RDW: 15.8 % — ABNORMAL HIGH (ref 11.6–15.4)
WBC: 6.2 10*3/uL (ref 3.4–10.8)

## 2020-07-22 LAB — PROTIME-INR
INR: 2.7 — ABNORMAL HIGH (ref 0.9–1.2)
Prothrombin Time: 27 s — ABNORMAL HIGH (ref 9.1–12.0)

## 2020-07-22 LAB — PRO B NATRIURETIC PEPTIDE: NT-Pro BNP: 64 pg/mL (ref 0–376)

## 2020-07-23 ENCOUNTER — Encounter: Payer: Self-pay | Admitting: Physician Assistant

## 2020-07-25 ENCOUNTER — Other Ambulatory Visit (HOSPITAL_COMMUNITY): Payer: Self-pay | Admitting: Internal Medicine

## 2020-07-26 ENCOUNTER — Telehealth: Payer: Self-pay

## 2020-07-26 NOTE — Telephone Encounter (Signed)
Called and left VM to call back and ask for pre-op pool

## 2020-07-26 NOTE — Telephone Encounter (Signed)
Pharmacy, please comment on coumadin. Thanks!

## 2020-07-26 NOTE — Telephone Encounter (Signed)
LVM to call back.

## 2020-07-26 NOTE — Telephone Encounter (Signed)
Pt returning calling to [place],    Best number [phone]

## 2020-07-26 NOTE — Telephone Encounter (Signed)
   [facility] Pre-operative Risk Assessment    HEARTCARE STAFF: - Please ensure there is not already an duplicate clearance open for this procedure. - Under Visit Info/Reason for Call, type in Other and utilize the format Clearance MM/DD/YY or Clearance TBD. Do not use dashes or single digits. - If request is for dental extraction, please clarify the # of teeth to be extracted.  Request for surgical clearance:  1. What type of surgery is being performed? C7-T1 Cervical Interlaminar Epidural Steroid Injection   2. When is this surgery scheduled? TBD   3. What type of clearance is required (medical clearance vs. Pharmacy clearance to hold med vs. Both)? Both  4. Are there any medications that need to be held prior to surgery and how long?Coumadin   5. Practice name and name of physician performing surgery? [facility]. Normajean Glasgow, MD  6. What is the office phone number? [phone]   7.   What is the office fax number? [phone]  8.   Anesthesia type (None, local, MAC, general) ? Local   Monia Pouch 07/26/2020, 11:13 AM  _________________________________________________________________   (provider comments below)

## 2020-07-26 NOTE — Telephone Encounter (Signed)
Patient with diagnosis of DVT/PE on warfarin for anticoagulation.    Procedure: C7-T1 Cervical Interlaminar Epidural Steroid Injection  Date of procedure: TBD    Patient has hx of multiple PE/DVT  CrCl 53 ml/min Platelet count 190  Per office protocol, patient can hold warfarin for 5 days prior to procedure.   Patient WILL need bridging with Lovenox (enoxaparin) around procedure.  Please have patient call the [place] coumadin clinic as soon as he knows the date of the procedure. Will need about 7 day notice to coordinate bridge.

## 2020-07-27 ENCOUNTER — Telehealth: Payer: Self-pay

## 2020-07-27 NOTE — Telephone Encounter (Signed)
   Primary Cardiologist: Quay Burow, MD  Chart reviewed as part of pre-operative protocol coverage. Because of Amerigo P Torpey's past medical history and recent syncope, he will require a follow-up visit in order to better assess preoperative cardiovascular risk.  Pre-op covering staff: -Please add "pre-op clearance" to the appointment notes for upcoming visit with Almyra Deforest, PA-C 08/16/20 so provider is aware. - Please contact requesting surgeon's office via preferred method (i.e, phone, fax) to inform them of need for appointment prior to surgery.  Recommendations for holding coumadin are documented below. He will require a lovenox bridge which will be coordinated by the coumadin clinic.   Abigail Butts, PA-C  07/27/2020, 9:34 AM

## 2020-07-27 NOTE — Telephone Encounter (Signed)
Pre op Clearance notes added to appt notes. Will send clearance notes to PA for upcoming appt. Will send FYI to requesting office pt has appt 08/16/20. Will remove from the pre op call back pool.

## 2020-07-27 NOTE — Telephone Encounter (Signed)
I called [facility] as both the fax # and phone # on the clearance entered are for our [place] office. I have obtained the correct ph# and fax# for [facility] and will fax clearance.   [place] Ph# [phone]; fax# [phone]

## 2020-07-27 NOTE — Telephone Encounter (Signed)
Patient is returning call.  °

## 2020-07-27 NOTE — Telephone Encounter (Signed)
Called [employer] spoke with [place]. Advised patient scheduled for appointment in our office for clearance on August 16, 2020

## 2020-07-29 ENCOUNTER — Other Ambulatory Visit: Payer: Self-pay | Admitting: Cardiovascular Disease

## 2020-08-01 NOTE — Telephone Encounter (Signed)
Will address clearance on follow up and remove from the preop pool for now

## 2020-08-07 MED FILL — VYNDAMAX 61 MG CAPS: 61 | 30 days supply | Qty: 30 | Fill #10

## 2020-08-11 ENCOUNTER — Ambulatory Visit (INDEPENDENT_AMBULATORY_CARE_PROVIDER_SITE_OTHER): Payer: Medicare Other

## 2020-08-11 ENCOUNTER — Other Ambulatory Visit: Payer: Self-pay

## 2020-08-11 DIAGNOSIS — I2699 Other pulmonary embolism without acute cor pulmonale: Secondary | ICD-10-CM | POA: Diagnosis not present

## 2020-08-11 DIAGNOSIS — Z7901 Long term (current) use of anticoagulants: Secondary | ICD-10-CM | POA: Diagnosis not present

## 2020-08-11 LAB — POCT INR: INR: 1.9 — AB (ref 2.0–3.0)

## 2020-08-11 NOTE — Patient Instructions (Signed)
Take an extra 1/2 tablet tonight only and then  Continue taking 1.5 tables daily.  Repeat INR in 4 weeks

## 2020-08-13 ENCOUNTER — Other Ambulatory Visit: Payer: Self-pay | Admitting: Internal Medicine

## 2020-08-13 DIAGNOSIS — G4700 Insomnia, unspecified: Secondary | ICD-10-CM

## 2020-08-16 ENCOUNTER — Other Ambulatory Visit: Payer: Self-pay

## 2020-08-16 ENCOUNTER — Ambulatory Visit (INDEPENDENT_AMBULATORY_CARE_PROVIDER_SITE_OTHER): Payer: Medicare Other | Admitting: Physician Assistant

## 2020-08-16 VITALS — BP 116/74 | HR 84 | Ht 66.0 in | Wt 177.0 lb

## 2020-08-16 DIAGNOSIS — I824Y9 Acute embolism and thrombosis of unspecified deep veins of unspecified proximal lower extremity: Secondary | ICD-10-CM | POA: Diagnosis not present

## 2020-08-16 DIAGNOSIS — Z95810 Presence of automatic (implantable) cardiac defibrillator: Secondary | ICD-10-CM

## 2020-08-16 DIAGNOSIS — I428 Other cardiomyopathies: Secondary | ICD-10-CM

## 2020-08-16 DIAGNOSIS — I10 Essential (primary) hypertension: Secondary | ICD-10-CM

## 2020-08-16 DIAGNOSIS — E854 Organ-limited amyloidosis: Secondary | ICD-10-CM

## 2020-08-16 DIAGNOSIS — Z01810 Encounter for preprocedural cardiovascular examination: Secondary | ICD-10-CM | POA: Diagnosis not present

## 2020-08-16 DIAGNOSIS — I43 Cardiomyopathy in diseases classified elsewhere: Secondary | ICD-10-CM

## 2020-08-16 DIAGNOSIS — R079 Chest pain, unspecified: Secondary | ICD-10-CM

## 2020-08-16 DIAGNOSIS — R55 Syncope and collapse: Secondary | ICD-10-CM

## 2020-08-16 NOTE — Patient Instructions (Signed)
Medication Instructions:  Call our office for lovenox bridge after your provider notifies you of your injection procedure date *If you need a refill on your cardiac medications before your next appointment, please call your pharmacy*   Lab Work: None ordered If you have labs (blood work) drawn today and your tests are completely normal, you will receive your results only by: Marland Kitchen MyChart Message (if you have MyChart) OR . A paper copy in the mail If you have any lab test that is abnormal or we need to change your treatment, we will call you to review the results.   Testing/Procedures: None   Follow-Up: At [facility], you and your health needs are our priority.  As part of our continuing mission to provide you with exceptional heart care, we have created designated Provider Care Teams.  These Care Teams include your primary Cardiologist (physician) and Advanced Practice Providers (APPs -  Physician Assistants and Nurse Practitioners) who all work together to provide you with the care you need, when you need it.  We recommend signing up for the patient portal called "MyChart".  Sign up information is provided on this After Visit Summary.  MyChart is used to connect with patients for Virtual Visits (Telemedicine).  Patients are able to view lab/test results, encounter notes, upcoming appointments, etc.  Non-urgent messages can be sent to your provider as well.   To learn more about what you can do with MyChart, go to [email or web address].    Your next appointment:   6 month(s)  The format for your next appointment:   In Person  Provider:   Quay Burow, MD   Other Instructions None

## 2020-08-16 NOTE — Progress Notes (Signed)
Cardiology Office Note:    Date:  08/17/2020   ID:  Jonathan Dickson, DOB 1956/02/19, MRN [id number]  PCP:  Patient, No Pcp Per  [facility] Cardiologist:  Quay Burow, MD  [place] Electrophysiologist:  Will Meredith Leeds, MD   Referring MD: No ref. provider found   Chief Complaint  Patient presents with   Pre-op Exam    upcoming epidural injection    History of Present Illness:    Jonathan Dickson is a 64 y.o. male with a hx of hypertension, prediabetes, bilateral PE/DVT failed Eliquis, cardiac amyloidosis and history of nonischemic cardiomyopathy s/p ICD.  He was diagnosed with nonischemic cardiomyopathy in May 2017.  Echocardiogram obtained as part of cardiac clearance came back on 02/05/2016 with unexpected result of EF 25%, grade 1 DD, mild MR.  Myoview showed EF 22%, overall high risk study without ischemia or infarction.  Patient ultimately underwent cardiac catheterization on 03/11/2016 which showed angiographically normal coronary arteries with codominant system, mild to moderately elevated LVEDP.  He was placed on carvedilol and lisinopril.  Repeat echocardiogram by September 2017 showed EF has improved to 35 to 40%.  He was referred to heart failure service in 2019, heart failure medication were optimized.  Cardiac MRI performed on 04/01/2018 showed EF 20% with changes consistent with cardiac amyloidosis and he was started on medication for this.  ICD was implanted by Dr. Curt Bears prophylactically on 06/07/2018.  He had bilateral PE and was placed on Eliquis, however he ended up developing a left popliteal DVT while on the Eliquis.  Eliquis was eventually switched to Coumadin due to failure.  He was last seen by Dr. [name] on 7/82/9562, systolic blood pressure was in the 80s at the time.  He was on carvedilol and Entresto.  I last saw the patient on 07/21/2020 for evaluation of syncope.  This occurred after he was doing repetitive work of taking up produce off of the floor then  bending over multiple times.  It started with some dizzy spell and eventually he passed out.  On arrival, his blood pressure was very low.  I reduced his hydralazine to 25 mg 3 times a day.  I discussed with [name] and we decided to hold off on repeating echocardiogram at that time.  He has upcoming epidural steroid injection in his neck.  Our clinical pharmacist recommended Lovenox bridging around the time of procedure.  Since the last visit, he has not had any further dizzy spell.  Blood pressure has also improved.  He denies any chest pain or shortness of breath.  He is cleared to proceed with upcoming neck injection.  He will need Lovenox bridging once he confirmed the date of the procedure.  Overall, he is quite stable and can follow-up in 4 to 6 months.   Past Medical History:  Diagnosis Date   Abnormal TSH 02/2016   Anxiety    Cervical disc disease    Chronic combined systolic and diastolic CHF (congestive heart failure) ([place])    a. 01/2016 Echo: EF 25%, inf AK, diffuse sev HK, Gr1 DD, mild MR.   Depression    History of hiatal hernia    Hyperglycemia    Hypertension    Hypertensive heart disease    Myocardial infarction [employer])    ??  maybe   NICM (nonischemic cardiomyopathy) ([place])    a. 01/2016 Echo: EF 25%, inf AK, diffuse sev HK, Gr1 DD, mild MR; b. 01/2016 MV: EF 22%, no isch/infarct;  c. 02/2016  Cath: Nl cors, EF 35-45%.   Pneumonia    Pre-diabetes    Seizures ([place])    with penicillin    Past Surgical History:  Procedure Laterality Date   ANTERIOR CERVICAL DECOMP/DISCECTOMY FUSION N/A 07/10/2016   Procedure: ANTERIOR CERVICAL DECOMPRESSION FUSION CERVICAL 4-5, CERVICAL 5-6, CERVICAL 6-7 WITH INSTRUMENTATION AND ALLOGRAFT;  Surgeon: [name], MD;  Location: [place];  Service: Orthopedics;  Laterality: N/A;   CARDIAC CATHETERIZATION N/A 03/11/2016   Procedure: Left Heart Cath and Coronary Angiography;  Surgeon: [name], MD;  Location: [place] [facility];   Service: Cardiovascular;  Laterality: N/A;   ICD IMPLANT N/A 06/17/2018   Procedure: ICD IMPLANT;  Surgeon: [name], MD;  Location: [place] [facility];  Service: Cardiovascular;  Laterality: N/A;   Thumb surgery Right    VENTRAL HERNIA REPAIR N/A 10/25/2019   Procedure: PRIMARY VENTRAL HERNIA REPAIR;  Surgeon: [name], MD;  Location: [facility];  Service: General;  Laterality: N/A;    Current Medications: Current Meds  Medication Sig   acetaminophen (TYLENOL) 500 MG tablet Take 1,000 mg by mouth every 6 (six) hours as needed for moderate pain or headache.   albuterol (VENTOLIN HFA) 108 (90 Base) MCG/ACT inhaler INHALE 2 PUFFS INTO THE LUNGS EVERY 6 HOURS AS NEEDED FOR WHEEZING OR SHORTNESS OF BREATH   amitriptyline (ELAVIL) 25 MG tablet TAKE 1 TABLET(25 MG) BY MOUTH AT BEDTIME AS NEEDED FOR SLEEP   Aromatic Inhalants (VICKS VAPOINHALER) INHA Inhale 1 Dose into the lungs daily as needed (congestion).   atorvastatin (LIPITOR) 80 MG tablet Take 1 tablet (80 mg total) by mouth daily.   carvedilol (COREG) 25 MG tablet TAKE 1 TABLET(25 MG) BY MOUTH TWICE DAILY WITH A MEAL   Chlorphen-Phenyleph-ASA (ALKA-SELTZER PLUS COLD PO) Take 1-2 tablets by mouth 2 (two) times daily as needed (cold symptoms).    Cholecalciferol (VITAMIN D) 50 MCG (2000 UT) tablet Take 2,000 Units by mouth daily.   diclofenac Sodium (VOLTAREN) 1 % GEL Apply 2 g topically 4 (four) times daily.   ENTRESTO 97-103 MG TAKE 1 TABLET BY MOUTH TWICE DAILY   FARXIGA 10 MG TABS tablet TAKE 1 TABLET BY MOUTH DAILY BEFORE BREAKFAST   furosemide (LASIX) 40 MG tablet Take 1 tablet (40 mg total) by mouth daily as needed for fluid (if weight gain more than 3 punds).   hydrALAZINE (APRESOLINE) 25 MG tablet Take 1 tablet (25 mg total) by mouth 3 (three) times daily.   HYDROcodone-acetaminophen (NORCO/VICODIN) 5-325 MG tablet Take 1 tablet by mouth 2 (two) times daily as needed.   isosorbide mononitrate (IMDUR)  30 MG 24 hr tablet Take 1 tablet (30 mg total) by mouth at bedtime.   metFORMIN (GLUCOPHAGE) 500 MG tablet TAKE 1 TABLET(500 MG) BY MOUTH DAILY WITH BREAKFAST   methocarbamol (ROBAXIN) 500 MG tablet Take 1 tablet (500 mg total) by mouth every 8 (eight) hours as needed for muscle spasms.   pseudoephedrine (SUDAFED) 30 MG tablet Take 30 mg by mouth every 4 (four) hours as needed for congestion.   sildenafil (VIAGRA) 50 MG tablet TAKE 1 TABLET BY MOUTH EVERY DAIY AS NEEDED FOR ERECTILE DYSFUNCTION   spironolactone (ALDACTONE) 25 MG tablet TAKE 1/2 TABLET(12.5 MG) BY MOUTH DAILY   Tafamidis (VYNDAMAX) 61 MG CAPS Take 61 mg by mouth daily.   warfarin (COUMADIN) 5 MG tablet TAKE 1 TO 2 TABLETS BY MOUTH DAILY AS DIRECTED     Allergies:   Peanut-containing drug products, Penicillins,  and Decadron [dexamethasone]   Social History   Socioeconomic History   Marital status: Legally Separated    Spouse name: Not on file   Number of children: 3   Years of education: Not on file   Highest education level: Not on file  Occupational History   Not on file  Tobacco Use   Smoking status: Former Smoker    Packs/day: 1.50    Years: 8.00    Pack years: 12.00    Quit date: 03/15/2003    Years since quitting: 17.4   Smokeless tobacco: Never Used  Vaping Use   Vaping Use: Never used  Substance and Sexual Activity   Alcohol use: No    Comment: none since 2004   Drug use: No    Comment: former  none since 2004   Sexual activity: Not Currently  Other Topics Concern   Not on file  Social History Narrative   Not on file   Social Determinants of Health   Financial Resource Strain:    Difficulty of Paying Living Expenses: Not on file  Food Insecurity:    Worried About Charity fundraiser in the Last Year: Not on file   [employer] of Food in the Last Year: Not on file  Transportation Needs:    Lack of Transportation (Medical): Not on file   Lack of Transportation (Non-Medical):  Not on file  Physical Activity:    Days of Exercise per Week: Not on file   Minutes of Exercise per Session: Not on file  Stress:    Feeling of Stress : Not on file  Social Connections:    Frequency of Communication with Friends and Family: Not on file   Frequency of Social Gatherings with Friends and Family: Not on file   Attends Religious Services: Not on file   Active Member of Clubs or Organizations: Not on file   Attends Archivist Meetings: Not on file   Marital Status: Not on file     Family History: The patient's family history includes Diabetes in his mother and sister.  ROS:   Please see the history of present illness.     All other systems reviewed and are negative.  EKGs/Labs/Other Studies Reviewed:    The following studies were reviewed today:  Echo 02/14/2020 1. Diffuse hypokinesis worse in the septum and inferior base EF similar  to TTE done November 2020. Left ventricular ejection fraction, by  estimation, is 35 to 40%. The left ventricle has moderately decreased  function. The left ventricle demonstrates  global hypokinesis. Left ventricular diastolic parameters were normal.  2. Pacing wires in RA/RV. Right ventricular systolic function is normal.  The right ventricular size is normal. There is normal pulmonary artery  systolic pressure.  3. The mitral valve is normal in structure. Trivial mitral valve  regurgitation. No evidence of mitral stenosis.  4. The aortic valve is normal in structure. Aortic valve regurgitation is  not visualized. No aortic stenosis is present.  5. The inferior vena cava is normal in size with greater than 50%  respiratory variability, suggesting right atrial pressure of 3 mmHg.   EKG:  EKG is ordered today.  The ekg ordered today demonstrates normal sinus rhythm, no significant ST-T wave changes.  Recent Labs: 09/25/2019: Magnesium 2.1 02/09/2020: B Natriuretic Peptide 315.9 07/21/2020: ALT 18; BUN 22;  Creatinine, Ser 1.37; Hemoglobin 12.7; NT-Pro BNP 64; Platelets 190; Potassium 5.0; Sodium 133  Recent Lipid Panel    Component Value Date/Time  CHOL 153 05/16/2020 0840   TRIG 175 (H) 05/16/2020 0840   HDL 41 05/16/2020 0840   CHOLHDL 3.7 05/16/2020 0840   CHOLHDL 2.9 10/04/2014 1508   VLDL 17 10/04/2014 1508   LDLCALC 82 05/16/2020 0840     Risk Assessment/Calculations:       Physical Exam:    VS:  BP 116/74 (BP Location: Left Arm, Patient Position: Sitting, Cuff Size: Normal)    Pulse 84    Ht 5\' 6"  (1.676 m)    Wt 177 lb (80.3 kg)    BMI 28.57 kg/m     Wt Readings from Last 3 Encounters:  08/16/20 177 lb (80.3 kg)  07/21/20 176 lb 6.4 oz (80 kg)  05/16/20 165 lb 12.8 oz (75.2 kg)     GEN:  Well nourished, well developed in no acute distress HEENT: Normal NECK: No JVD; No carotid bruits LYMPHATICS: No lymphadenopathy CARDIAC: RRR, no murmurs, rubs, gallops RESPIRATORY:  Clear to auscultation without rales, wheezing or rhonchi  ABDOMEN: Soft, non-tender, non-distended MUSCULOSKELETAL:  No edema; No deformity  SKIN: Warm and dry NEUROLOGIC:  Alert and oriented x 3 PSYCHIATRIC:  Normal affect   ASSESSMENT:    1. Preop cardiovascular exam   2. Essential hypertension   3. Deep vein thrombosis (DVT) of proximal lower extremity, unspecified chronicity, unspecified laterality (HCC)   4. Cardiac amyloidosis ([place])   5. NICM (nonischemic cardiomyopathy) ([place])   6. ICD (implantable cardioverter-defibrillator) in place    PLAN:    In order of problems listed above:  1. Preoperative clearance: Upcoming epidural steroid injection in his neck.  As mentioned by our clinical pharmacist, he will need Lovenox bridging due to history of recurrent PE and a DVT.  He is cleared from cardiac perspective to proceed with neck injection.  Once the date of the procedure is set, he will need to contact our Coumadin clinic to arrange the Lovenox bridging.  2. Hypertension: He was  hypotensive during the previous visit.  I reduced his hydralazine, blood pressure has improved.  3. 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 Cardiac amyloidosis: on tafamidis  5. Nonischemic cardiomyopathy: Ejection fraction is stable on the last echocardiogram in May 2021.  He appears to be euvolemic on exam  6. History of ICD: Followed by electrophysiology service    Shared Decision Making/Informed Consent        Medication Adjustments/Labs and Tests Ordered: Current medicines are reviewed at length with the patient today.  Concerns regarding medicines are outlined above.  Orders Placed This Encounter  Procedures   EKG 12-Lead   No orders of the defined types were placed in this encounter.   Patient Instructions  Medication Instructions:  Call our office for lovenox bridge after your provider notifies you of your injection procedure date *If you need a refill on your cardiac medications before your next appointment, please call your pharmacy*   Lab Work: None ordered If you have labs (blood work) drawn today and your tests are completely normal, you will receive your results only by:  [place] (if you have MyChart) OR  A paper copy in the mail If you have any lab test that is abnormal or we need to change your treatment, we will call you to review the results.   Testing/Procedures: None   Follow-Up: At [facility], you and your health needs are our priority.  As part of our continuing mission to provide you with exceptional heart care, we have created designated  Provider Care Teams.  These Care Teams include your primary Cardiologist (physician) and Advanced Practice Providers (APPs -  Physician Assistants and Nurse Practitioners) who all work together to provide you with the care you need, when you need it.  We recommend signing up for the patient portal called "MyChart".  Sign up information is provided on this After Visit Summary.  MyChart  is used to connect with patients for Virtual Visits (Telemedicine).  Patients are able to view lab/test results, encounter notes, upcoming appointments, etc.  Non-urgent messages can be sent to your provider as well.   To learn more about what you can do with MyChart, go to [email or web address].    Your next appointment:   6 month(s)  The format for your next appointment:   In Person  Provider:   Quay Burow, MD   Other Instructions None     Signed, Almyra Deforest, [place]  08/17/2020 11:39 PM     [facility]

## 2020-08-17 ENCOUNTER — Other Ambulatory Visit: Payer: Self-pay | Admitting: Internal Medicine

## 2020-08-17 ENCOUNTER — Encounter: Payer: Self-pay | Admitting: Physician Assistant

## 2020-08-17 DIAGNOSIS — R062 Wheezing: Secondary | ICD-10-CM

## 2020-08-17 NOTE — Telephone Encounter (Signed)
Requested medications are due for refill today yes  Requested medications are on the active medication list yes  Last refill 10/21   Notes to clinic Is this pt associated with your practice, there is no PCP listed, however, Dr. Wynetta Emery wrote Rx.

## 2020-08-31 ENCOUNTER — Telehealth: Payer: Self-pay | Admitting: Cardiovascular Disease

## 2020-08-31 NOTE — Telephone Encounter (Signed)
    I went in pt's chart to see who called him. 

## 2020-08-31 NOTE — Telephone Encounter (Signed)
Patient is following up regarding pre-op medication instructions. He states he never received advisement regarding whether or not he needs to hold any medications. Please return call to discuss.

## 2020-09-01 NOTE — Telephone Encounter (Signed)
Pt notified he will wait until the 10th for further direction

## 2020-09-07 MED FILL — VYNDAMAX 61 MG CAPS: 61 | 30 days supply | Qty: 30 | Fill #11

## 2020-09-08 ENCOUNTER — Other Ambulatory Visit: Payer: Self-pay

## 2020-09-08 ENCOUNTER — Ambulatory Visit (INDEPENDENT_AMBULATORY_CARE_PROVIDER_SITE_OTHER): Payer: Medicare Other

## 2020-09-08 DIAGNOSIS — I2699 Other pulmonary embolism without acute cor pulmonale: Secondary | ICD-10-CM

## 2020-09-08 DIAGNOSIS — Z7901 Long term (current) use of anticoagulants: Secondary | ICD-10-CM

## 2020-09-08 LAB — POCT INR: INR: 2.5 (ref 2.0–3.0)

## 2020-09-08 NOTE — Patient Instructions (Signed)
Continue taking 1.5 tables daily.  Repeat INR in 6 weeks

## 2020-09-11 ENCOUNTER — Other Ambulatory Visit: Payer: Self-pay | Admitting: Cardiovascular Disease

## 2020-09-11 ENCOUNTER — Telehealth: Payer: Self-pay | Admitting: Cardiovascular Disease

## 2020-09-11 NOTE — Telephone Encounter (Signed)
Jocelyn Lamer called back again with another fax number Faxed to Butch Penny at [phone] and called her at [phone] to follow up  Advised to call back if no fax received

## 2020-09-11 NOTE — Telephone Encounter (Signed)
Vickie calling back.

## 2020-09-11 NOTE — Telephone Encounter (Signed)
Vickie is a Theme park manager who is with the patient, they called regarding surgical clearance, we have not yet received the request from [facility], patiens says they have sent the request two times. Please advise.

## 2020-09-11 NOTE — Telephone Encounter (Signed)
Spoke with patient and sent to last office note to Dr Iva Lento office  Will send message to coumadin clinic to reach out regarding Lovenox bridging

## 2020-09-13 ENCOUNTER — Ambulatory Visit: Payer: Medicare Other | Admitting: Physician Assistant

## 2020-09-13 NOTE — Telephone Encounter (Signed)
Spoke with Jonathan Dickson and patient is scheduled for procedure 10/11/2020 Dr Mina Marble needs 2 thing prior to procedure  1) Needs patient not to have Lovenox 24 hours prior  2) Needs INR day before procedure   Will forward to anticoag clinic to reach out to patient

## 2020-09-13 NOTE — Telephone Encounter (Signed)
Left message for Jonathan Dickson to call back

## 2020-09-13 NOTE — Telephone Encounter (Signed)
Yes same clearance, patient has been waiting on getting scheduled

## 2020-09-13 NOTE — Telephone Encounter (Signed)
On from [employer] was calling to talk to [place] about the lovenox bridge

## 2020-09-13 NOTE — Telephone Encounter (Signed)
Is this the clearance from 10/27?

## 2020-09-14 NOTE — Telephone Encounter (Signed)
Jonathan Dickson is already working with this patient. He will call Jonathan Dickson on Friday to go over bridging details, procedure scheduled for 10/11/20

## 2020-09-18 ENCOUNTER — Telehealth (HOSPITAL_COMMUNITY): Payer: Self-pay | Admitting: Pharmacy Technician

## 2020-09-18 ENCOUNTER — Ambulatory Visit (INDEPENDENT_AMBULATORY_CARE_PROVIDER_SITE_OTHER): Payer: Medicare Other

## 2020-09-18 ENCOUNTER — Encounter: Payer: Self-pay | Admitting: Physician Assistant

## 2020-09-18 ENCOUNTER — Other Ambulatory Visit: Payer: Self-pay

## 2020-09-18 ENCOUNTER — Encounter: Payer: Medicare Other | Admitting: Physician Assistant

## 2020-09-18 DIAGNOSIS — Z7901 Long term (current) use of anticoagulants: Secondary | ICD-10-CM | POA: Diagnosis not present

## 2020-09-18 DIAGNOSIS — I2699 Other pulmonary embolism without acute cor pulmonale: Secondary | ICD-10-CM

## 2020-09-18 LAB — POCT INR: INR: 2.9 (ref 2.0–3.0)

## 2020-09-18 MED ORDER — ENOXAPARIN SODIUM 80 MG/0.8ML ~~LOC~~ SOLN: 80.0000 mg | Freq: Two times a day (BID) | SUBCUTANEOUS | 1 refills | Status: DC

## 2020-09-18 NOTE — Progress Notes (Signed)
This encounter was created in error - please disregard.

## 2020-09-18 NOTE — Telephone Encounter (Signed)
Patient Advocate Encounter   Received notification from [employer] that prior authorization for Vyndamax is required.   PA submitted on CoverMyMeds Key  [id number] Status is pending   Will continue to follow.

## 2020-09-18 NOTE — Patient Instructions (Signed)
Continue taking 1.5 tables daily.  Repeat INR in 2 weeks   10/05/20: Last dose of warfarin.  10/06/20: No warfarin or enoxaparin (Lovenox).  10/07/20: Inject enoxaparin 80mg  in the fatty abdominal tissue at least 2 inches from the belly button twice a day about 12 hours apart, 8am and 8pm rotate sites. No warfarin.  10/08/20: Inject enoxaparin in the fatty tissue every 12 hours, 8am and 8pm. No warfarin.  10/09/20: Inject enoxaparin in the fatty tissue every 12 hours, 8am and 8pm. No warfarin.  10/10/20: No enoxaparin and No warfarin.  Check INR in office.  10/11/20: Procedure Day - No enoxaparin - Resume warfarin in the evening or as directed by doctor  10/12/20: Resume enoxaparin inject in the fatty tissue every 12 hours and take warfarin  10/13/20: Inject enoxaparin in the fatty tissue every 12 hours and take warfarin  10/14/20: Inject enoxaparin in the fatty tissue every 12 hours and take warfarin  10/15/20: Inject enoxaparin in the fatty tissue every 12 hours and take warfarin  10/16/20: Inject enoxaparin in the fatty tissue every 12 hours and take warfarin  10/17/20: warfarin appt to check INR.

## 2020-09-23 ENCOUNTER — Other Ambulatory Visit: Payer: Self-pay | Admitting: Internal Medicine

## 2020-09-23 DIAGNOSIS — E119 Type 2 diabetes mellitus without complications: Secondary | ICD-10-CM

## 2020-09-24 NOTE — Telephone Encounter (Signed)
Requested medication (s) are due for refill today: yes  Requested medication (s) are on the active medication list: yes  Last refill:  06/22/20  Future visit scheduled: no  Notes to clinic:  over due labs    Requested Prescriptions  Pending Prescriptions Disp Refills   metFORMIN (GLUCOPHAGE) 500 MG tablet [Pharmacy Med Name: METFORMIN 500MG TABLETS] 90 tablet 0    Sig: TAKE 1 TABLET(500 MG) BY MOUTH DAILY WITH BREAKFAST      Endocrinology:  Diabetes - Biguanides Failed - 09/23/2020  3:35 AM      Failed - Cr in normal range and within 360 days    Creat  Date Value Ref Range Status  03/21/2016 0.98 0.70 - 1.25 mg/dL Final    Comment:      For patients > or = 65 years of age: The upper reference limit for Creatinine is approximately 13% higher for people identified as African-American.      Creatinine, Ser  Date Value Ref Range Status  07/21/2020 1.37 (H) 0.76 - 1.27 mg/dL Final          Failed - HBA1C is between 0 and 7.9 and within 180 days    HbA1c, POC (controlled diabetic range)  Date Value Ref Range Status  05/13/2019 6.6 0.0 - 7.0 % Final   Hgb A1c MFr Bld  Date Value Ref Range Status  09/24/2019 7.1 (H) 4.8 - 5.6 % Final    Comment:    (NOTE) Pre diabetes:          5.7%-6.4% Diabetes:              >6.4% Glycemic control for   <7.0% adults with diabetes           Failed - Valid encounter within last 6 months    Recent Outpatient Visits           7 months ago Type 2 diabetes mellitus with other circulatory complication, without long-term current use of insulin ([place])   [place], Neoma Laming B, MD   10 months ago Type 2 diabetes mellitus without complication, without long-term current use of insulin ([place])   [place], Neoma Laming B, MD   1 year ago Pain of left calf   [place], [place], [place]   1 year ago Type 2 diabetes mellitus without  complication, without long-term current use of insulin ([place])   [place], Deborah B, MD   1 year ago Type 2 diabetes mellitus with hyperglycemia, without long-term current use of insulin [facility])   [place], Levada Dy M, [place]                Passed - AA eGFR in normal range and within 360 days    GFR, Est African American  Date Value Ref Range Status  10/04/2015 >89 >=60 mL/min Final   GFR calc Af Amer  Date Value Ref Range Status  07/21/2020 62 >59 mL/min/1.73 Final    Comment:    **In accordance with recommendations from the NKF-ASN Task force,**   [facility] is in the process of updating its eGFR calculation to the   2021 CKD-EPI creatinine equation that estimates kidney function   without a race variable.    GFR, Est Non African American  Date Value Ref Range Status  10/04/2015 >89 >=60 mL/min Final    Comment:  The estimated GFR is a calculation valid for adults (>=37 years old) that uses the CKD-EPI algorithm to adjust for age and sex. It is   not to be used for children, pregnant women, hospitalized patients,    patients on dialysis, or with rapidly changing kidney function. According to the NKDEP, eGFR >89 is normal, 60-89 shows mild impairment, 30-59 shows moderate impairment, 15-29 shows severe impairment and <15 is ESRD.      GFR calc non Af Amer  Date Value Ref Range Status  07/21/2020 54 (L) >59 mL/min/1.73 Final

## 2020-09-26 ENCOUNTER — Ambulatory Visit (INDEPENDENT_AMBULATORY_CARE_PROVIDER_SITE_OTHER): Payer: Medicare Other

## 2020-09-26 DIAGNOSIS — I428 Other cardiomyopathies: Secondary | ICD-10-CM

## 2020-09-26 LAB — CUP PACEART REMOTE DEVICE CHECK
Battery Remaining Longevity: 78 mo
Battery Remaining Percentage: 76 %
Battery Voltage: 2.98 V
Brady Statistic RV Percent Paced: 1 %
Date Time Interrogation Session: 20211228024337
HighPow Impedance: 64 Ohm
HighPow Impedance: 64 Ohm
Implantable Lead Implant Date: 20190918
Implantable Lead Location: 753860
Implantable Lead Model: 7122
Implantable Pulse Generator Implant Date: 20190918
Lead Channel Impedance Value: 450 Ohm
Lead Channel Pacing Threshold Amplitude: 0.5 V
Lead Channel Pacing Threshold Pulse Width: 0.5 ms
Lead Channel Sensing Intrinsic Amplitude: 12 mV
Lead Channel Setting Pacing Amplitude: 2.5 V
Lead Channel Setting Pacing Pulse Width: 0.5 ms
Lead Channel Setting Sensing Sensitivity: 0.5 mV
Pulse Gen Serial Number: 9796641

## 2020-10-02 ENCOUNTER — Telehealth: Payer: Self-pay | Admitting: Internal Medicine

## 2020-10-02 NOTE — Telephone Encounter (Signed)
PT is Medication:  metFORMIN (GLUCOPHAGE) 500 MG tablet [[id number]]   Has the patient contacted their pharmacy? Yes   (Agent: If yes, when and what did the pharmacy advise?) to call the office   Preferred Pharmacy (with phone number or street name):  [id number] [place], [place] - [address] AT [facility] OF [address] [place] [address]  Phone:  [phone] Fax:  [phone]  Agent: Please be advised that RX refills may take up to 3 business days. We ask that you follow-up with your pharmacy.

## 2020-10-04 ENCOUNTER — Other Ambulatory Visit (HOSPITAL_COMMUNITY): Payer: Self-pay | Admitting: Internal Medicine

## 2020-10-04 ENCOUNTER — Other Ambulatory Visit: Payer: Self-pay

## 2020-10-04 DIAGNOSIS — E119 Type 2 diabetes mellitus without complications: Secondary | ICD-10-CM

## 2020-10-04 MED ORDER — METFORMIN HCL 500 MG PO TABS: ORAL_TABLET | ORAL | 0 refills | Status: DC

## 2020-10-04 NOTE — Telephone Encounter (Signed)
I have refilled rx for 30 days please contact pt and schedule an appt

## 2020-10-04 NOTE — Telephone Encounter (Signed)
Pls FU with Pt at [phone] Pt very irate , med not refilled, told pt would need appt last seen Feb 2021 in office, States he has called many times and always told Dr Shela Commons has no app....t but yet does not want to make an appt. Extremely upset.

## 2020-10-06 NOTE — Telephone Encounter (Signed)
Called patient let him know a 30 day refill was sent and scheduled him an appt for [name] 31st.

## 2020-10-09 MED FILL — VYNDAMAX 61 MG CAPS: 61 | 30 days supply | Qty: 30 | Fill #0

## 2020-10-10 ENCOUNTER — Other Ambulatory Visit: Payer: Self-pay

## 2020-10-10 ENCOUNTER — Ambulatory Visit (INDEPENDENT_AMBULATORY_CARE_PROVIDER_SITE_OTHER): Payer: Medicare Other | Admitting: Pharmacist Clinician (PhC)/ Clinical Pharmacy Specialist

## 2020-10-10 DIAGNOSIS — Z7901 Long term (current) use of anticoagulants: Secondary | ICD-10-CM

## 2020-10-10 DIAGNOSIS — I2699 Other pulmonary embolism without acute cor pulmonale: Secondary | ICD-10-CM

## 2020-10-10 LAB — POCT INR: INR: 1 — AB (ref 2.0–3.0)

## 2020-10-10 NOTE — Progress Notes (Signed)
Remote ICD transmission.   

## 2020-10-11 DIAGNOSIS — M5412 Radiculopathy, cervical region: Secondary | ICD-10-CM | POA: Diagnosis not present

## 2020-10-23 ENCOUNTER — Other Ambulatory Visit: Payer: Self-pay

## 2020-10-23 ENCOUNTER — Ambulatory Visit (INDEPENDENT_AMBULATORY_CARE_PROVIDER_SITE_OTHER): Payer: Medicare Other

## 2020-10-23 DIAGNOSIS — I2699 Other pulmonary embolism without acute cor pulmonale: Secondary | ICD-10-CM | POA: Diagnosis not present

## 2020-10-23 DIAGNOSIS — Z7901 Long term (current) use of anticoagulants: Secondary | ICD-10-CM | POA: Diagnosis not present

## 2020-10-23 LAB — POCT INR: INR: 1.6 — AB (ref 2.0–3.0)

## 2020-10-23 NOTE — Patient Instructions (Signed)
Take 2.5 tablets today only and then continue taking 1.5 tablets daily.  Repeat 4 weeks

## 2020-10-25 ENCOUNTER — Other Ambulatory Visit: Payer: Self-pay | Admitting: Cardiovascular Disease

## 2020-10-25 DIAGNOSIS — I5022 Chronic systolic (congestive) heart failure: Secondary | ICD-10-CM

## 2020-10-25 DIAGNOSIS — I10 Essential (primary) hypertension: Secondary | ICD-10-CM

## 2020-10-25 DIAGNOSIS — I428 Other cardiomyopathies: Secondary | ICD-10-CM

## 2020-10-26 ENCOUNTER — Other Ambulatory Visit: Payer: Self-pay | Admitting: Cardiovascular Disease

## 2020-10-30 ENCOUNTER — Encounter: Payer: Self-pay | Admitting: Internal Medicine

## 2020-10-30 ENCOUNTER — Other Ambulatory Visit: Payer: Self-pay

## 2020-10-30 ENCOUNTER — Ambulatory Visit: Payer: Medicare Other | Attending: Internal Medicine | Admitting: Internal Medicine

## 2020-10-30 VITALS — BP 100/63 | HR 83 | Temp 97.8°F | Resp 16 | Wt 173.0 lb

## 2020-10-30 DIAGNOSIS — R0600 Dyspnea, unspecified: Secondary | ICD-10-CM | POA: Diagnosis not present

## 2020-10-30 DIAGNOSIS — Z9229 Personal history of other drug therapy: Secondary | ICD-10-CM | POA: Diagnosis not present

## 2020-10-30 DIAGNOSIS — N521 Erectile dysfunction due to diseases classified elsewhere: Secondary | ICD-10-CM | POA: Diagnosis not present

## 2020-10-30 DIAGNOSIS — I10 Essential (primary) hypertension: Secondary | ICD-10-CM | POA: Diagnosis not present

## 2020-10-30 DIAGNOSIS — E1159 Type 2 diabetes mellitus with other circulatory complications: Secondary | ICD-10-CM | POA: Diagnosis not present

## 2020-10-30 DIAGNOSIS — E119 Type 2 diabetes mellitus without complications: Secondary | ICD-10-CM

## 2020-10-30 DIAGNOSIS — R0609 Other forms of dyspnea: Secondary | ICD-10-CM

## 2020-10-30 DIAGNOSIS — Z23 Encounter for immunization: Secondary | ICD-10-CM | POA: Diagnosis not present

## 2020-10-30 DIAGNOSIS — I428 Other cardiomyopathies: Secondary | ICD-10-CM | POA: Diagnosis not present

## 2020-10-30 LAB — POCT GLYCOSYLATED HEMOGLOBIN (HGB A1C): HbA1c, POC (controlled diabetic range): 6.4 % (ref 0.0–7.0)

## 2020-10-30 LAB — GLUCOSE, POCT (MANUAL RESULT ENTRY): POC Glucose: 109 mg/dl — AB (ref 70–99)

## 2020-10-30 MED ORDER — ALBUTEROL SULFATE HFA 108 (90 BASE) MCG/ACT IN AERS: INHALATION_SPRAY | RESPIRATORY_TRACT | 4 refills | Status: DC

## 2020-10-30 NOTE — Patient Instructions (Signed)
Influenza Virus Vaccine injection (Fluarix) What is this medicine? INFLUENZA VIRUS VACCINE (in floo EN zuh VAHY ruhs vak SEEN) helps to reduce the risk of getting influenza also known as the flu. This medicine may be used for other purposes; ask your health care provider or pharmacist if you have questions. COMMON BRAND NAME(S): Fluarix, Fluzone What should I tell my health care provider before I take this medicine? They need to know if you have any of these conditions:  bleeding disorder like hemophilia  fever or infection  Guillain-Barre syndrome or other neurological problems  immune system problems  infection with the human immunodeficiency virus (HIV) or AIDS  low blood platelet counts  multiple sclerosis  an unusual or allergic reaction to influenza virus vaccine, eggs, chicken proteins, latex, gentamicin, other medicines, foods, dyes or preservatives  pregnant or trying to get pregnant  breast-feeding How should I use this medicine? This vaccine is for injection into a muscle. It is given by a health care professional. A copy of Vaccine Information Statements will be given before each vaccination. Read this sheet carefully each time. The sheet may change frequently. Talk to your pediatrician regarding the use of this medicine in children. Special care may be needed. Overdosage: If you think you have taken too much of this medicine contact a poison control center or emergency room at once. NOTE: This medicine is only for you. Do not share this medicine with others. What if I miss a dose? This does not apply. What may interact with this medicine?  chemotherapy or radiation therapy  medicines that lower your immune system like etanercept, anakinra, infliximab, and adalimumab  medicines that treat or prevent blood clots like warfarin  phenytoin  steroid medicines like prednisone or cortisone  theophylline  vaccines This list may not describe all possible  interactions. Give your health care provider a list of all the medicines, herbs, non-prescription drugs, or dietary supplements you use. Also tell them if you smoke, drink alcohol, or use illegal drugs. Some items may interact with your medicine. What should I watch for while using this medicine? Report any side effects that do not go away within 3 days to your doctor or health care professional. Call your health care provider if any unusual symptoms occur within 6 weeks of receiving this vaccine. You may still catch the flu, but the illness is not usually as bad. You cannot get the flu from the vaccine. The vaccine will not protect against colds or other illnesses that may cause fever. The vaccine is needed every year. What side effects may I notice from receiving this medicine? Side effects that you should report to your doctor or health care professional as soon as possible:  allergic reactions like skin rash, itching or hives, swelling of the face, lips, or tongue Side effects that usually do not require medical attention (report to your doctor or health care professional if they continue or are bothersome):  fever  headache  muscle aches and pains  pain, tenderness, redness, or swelling at site where injected  weak or tired This list may not describe all possible side effects. Call your doctor for medical advice about side effects. You may report side effects to FDA at 1-800-FDA-1088. Where should I keep my medicine? This vaccine is only given in a clinic, pharmacy, doctor's office, or other health care setting and will not be stored at home. NOTE: This sheet is a summary. It may not cover all possible information. If you have questions   about this medicine, talk to your doctor, pharmacist, or health care provider.  2021 Elsevier/Gold Standard (2008-04-13 09:30:40)  

## 2020-10-30 NOTE — Progress Notes (Signed)
Patient ID: Jonathan Dickson, male    DOB: [birth year]  MRN: [id number]  CC: Medication Refill, Diabetes, and Hypertension   Subjective: Jonathan Dickson is a 65 y.o. male who presents for chronic ds management His concerns today include:  Pt with hx of NICM, chronic systolic CHF with recent EF of 35-40%, ICD,,cardiac amyloidosisHTN, DM,BL PE/DVT LLE(plan for lifelong anticoagulation, hx of recurrent), insomnia, hx of COVID with DVT  C/o SOB/DOE when he walks up stairs in his apartment building for over 1 yr.  He lives on 2nd floor.  Also gets SOB when he walks about 1 block.   "Feels like I run a marathon." Thinks he has COPD.  Quit smoking 17 yrs ago.   No chronic cough. Little wheezing when he exerts himself.  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  Had cardiopulmonary exercise test back in July 2019 and based on the results it looks like most of his dyspnea was related to his cardiomyopathy.  EF on echo done May of last year was 35 to 40% slightly improved from previous.  N ICM/cardiac amyloid/history of recurrent DVT:  NO LE edema/PND/orthopnea Compliant with heart meds.  Limits salt in foods No bruising or bleeding on Coumadin.  Levels check through cardiologist  C/o problems and maintaining erection x 2 yrs. Prescribed Sildenafil in 2020 by our PA. Found it helpful.  Of note he is on isosorbide.  DIABETES TYPE 2 Last A1C:   Results for orders placed or performed in visit on 10/30/20  POCT glucose (manual entry)  Result Value Ref Range   POC Glucose 109 (A) 70 - 99 mg/dl  POCT glycosylated hemoglobin (Hb A1C)  Result Value Ref Range   Hemoglobin A1C     HbA1c POC (<> result, manual entry)     HbA1c, POC (prediabetic range)     HbA1c, POC (controlled diabetic range) 6.4 0.0 - 7.0 %    Med Adherence:  [x]  Yes  -on Metformin and Farxiga Medication side effects:  []  Yes    [x]  No Home Monitoring?  [x]   Yes -occasionally    []  No Home glucose results range: Diet Adherence: [x]  Yes   Exercise: [x]  Yes    []  No Hypoglycemic episodes?: []  Yes    []  No Numbness of the feet? []  Yes    [x]  No Retinopathy hx? []  Yes    [x]  No Last eye exam:  Comments:   HM: Due for flu shot and Prevnar 13.  He received the flu shot today.  He defers on getting Prevnar 13 until next visit.  Completed COVID vaccine and booster with Moderna but does not have his card with him..    Patient Active Problem List   Diagnosis Date Noted  . Influenza vaccine needed 10/30/2020  . ICD (implantable cardioverter-defibrillator) in place 05/16/2020  . Blood clotting disorder ([place]) 03/08/2020  . Long term (current) use of anticoagulants 08/06/2019  . DVT (deep venous thrombosis) ([place]) 08/04/2019  . Hypertensive retinopathy 06/30/2019  . Pain due to onychomycosis of toenails of both feet 05/28/2019  . Hyperlipidemia associated with type 2 diabetes mellitus ([place]) 05/15/2019  . Type 2 diabetes mellitus without complication, without long-term current use of insulin ([place]) 05/13/2019  . 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 Chronic systolic heart failure ([place]) 06/17/2018  . Bilateral pulmonary embolism ([place]) 05/12/2018  . Heart failure ([place]) 05/11/2018  . Claudication in peripheral vascular disease ([place])  12/31/2017  . Decreased hearing of both ears 11/10/2017  . Cervical disc disease with myelopathy 07/10/2016  . NICM (nonischemic cardiomyopathy) ([place]) 03/12/2016  . Congestive dilated cardiomyopathy ([place]) 03/11/2016  . Cervical radiculitis 04/19/2015  . Essential hypertension 09/27/2014     Current Outpatient Medications on File Prior to Visit  Medication Sig Dispense Refill  . warfarin (COUMADIN) 5 MG tablet TAKE 1 TO 2 TABLETS BY MOUTH DAILY AS DIRECTED 90 tablet 0  . acetaminophen (TYLENOL) 500 MG tablet Take 1,000 mg by mouth every 6 (six) hours as needed for moderate pain or headache.    . albuterol (VENTOLIN HFA)  108 (90 Base) MCG/ACT inhaler INHALE 2 PUFFS INTO THE LUNGS EVERY 6 HOURS AS NEEDED FOR WHEEZING OR SHORTNESS OF BREATH 6.7 g 0  . amitriptyline (ELAVIL) 25 MG tablet TAKE 1 TABLET(25 MG) BY MOUTH AT BEDTIME AS NEEDED FOR SLEEP 60 tablet 0  . Aromatic Inhalants (VICKS VAPOINHALER) INHA Inhale 1 Dose into the lungs daily as needed (congestion).    Marland Kitchen atorvastatin (LIPITOR) 80 MG tablet Take 1 tablet (80 mg total) by mouth daily. 30 tablet 6  . carvedilol (COREG) 25 MG tablet TAKE 1 TABLET(25 MG) BY MOUTH TWICE DAILY WITH A MEAL 180 tablet 2  . Chlorphen-Phenyleph-ASA (ALKA-SELTZER PLUS COLD PO) Take 1-2 tablets by mouth 2 (two) times daily as needed (cold symptoms).     . Cholecalciferol (VITAMIN D) 50 MCG (2000 UT) tablet Take 2,000 Units by mouth daily.    . diclofenac Sodium (VOLTAREN) 1 % GEL Apply 2 g topically 4 (four) times daily. 100 g 1  . enoxaparin (LOVENOX) 80 MG/0.8ML injection Inject 0.8 mLs (80 mg total) into the skin every 12 (twelve) hours. 16 mL 1  . ENTRESTO 97-103 MG TAKE 1 TABLET BY MOUTH TWICE DAILY 60 tablet 11  . FARXIGA 10 MG TABS tablet TAKE 1 TABLET BY MOUTH DAILY BEFORE BREAKFAST 30 tablet 5  . furosemide (LASIX) 40 MG tablet Take 1 tablet (40 mg total) by mouth daily as needed for fluid (if weight gain more than 3 punds). 30 tablet 0  . hydrALAZINE (APRESOLINE) 25 MG tablet Take 1 tablet (25 mg total) by mouth 3 (three) times daily. 90 tablet 0  . HYDROcodone-acetaminophen (NORCO/VICODIN) 5-325 MG tablet Take 1 tablet by mouth 2 (two) times daily as needed.    . isosorbide mononitrate (IMDUR) 30 MG 24 hr tablet Take 1 tablet (30 mg total) by mouth at bedtime. 90 tablet 3  . metFORMIN (GLUCOPHAGE) 500 MG tablet TAKE 1 TABLET(500 MG) BY MOUTH DAILY WITH BREAKFAST 30 tablet 0  . methocarbamol (ROBAXIN) 500 MG tablet Take 1 tablet (500 mg total) by mouth every 8 (eight) hours as needed for muscle spasms. 15 tablet 0  . pseudoephedrine (SUDAFED) 30 MG tablet Take 30 mg by mouth  every 4 (four) hours as needed for congestion. (Patient not taking: Reported on 10/30/2020)    . sildenafil (VIAGRA) 50 MG tablet TAKE 1 TABLET BY MOUTH EVERY DAIY AS NEEDED FOR ERECTILE DYSFUNCTION 10 tablet 2  . spironolactone (ALDACTONE) 25 MG tablet TAKE 1/2 TABLET(12.5 MG) BY MOUTH DAILY 15 tablet 11  . VYNDAMAX 61 MG CAPS TAKE 1 CAPSULE (61 MG) BY MOUTH DAILY. PLEASE DISCONTINUE VYNDAQEL. 30 capsule 11   No current facility-administered medications on file prior to visit.    Allergies  Allergen Reactions  . Peanut-Containing Drug Products Swelling  . Penicillins Other (See Comments)    CONVULSIONS Did it involve swelling of the  face/tongue/throat, SOB, or low BP? No Did it involve sudden or severe rash/hives, skin peeling, or any reaction on the inside of your mouth or nose? No Did you need to seek medical attention at a hospital or doctor's office? Yes When did it last happen?25 years If all above answers are "NO", may proceed with cephalosporin use.   . Decadron [Dexamethasone] Itching    Social History   Socioeconomic History  . Marital status: Legally Separated    Spouse name: Not on file  . Number of children: 3  . Years of education: Not on file  . Highest education level: Not on file  Occupational History  . Not on file  Tobacco Use  . Smoking status: Former Smoker    Packs/day: 1.50    Years: 8.00    Pack years: 12.00    Quit date: 03/15/2003    Years since quitting: 17.6  . Smokeless tobacco: Never Used  Vaping Use  . Vaping Use: Never used  Substance and Sexual Activity  . Alcohol use: No    Comment: none since 2004  . Drug use: No    Comment: former  none since 2004  . Sexual activity: Not Currently  Other Topics Concern  . Not on file  Social History Narrative  . Not on file   Social Determinants of Health   Financial Resource Strain: Not on file  Food Insecurity: Not on file  Transportation Needs: Not on file  Physical Activity: Not on  file  Stress: Not on file  Social Connections: Not on file  Intimate Partner Violence: Not on file    Family History  Problem Relation Age of Onset  . Diabetes Mother   . Diabetes Sister     Past Surgical History:  Procedure Laterality Date  . ANTERIOR CERVICAL DECOMP/DISCECTOMY FUSION N/A 07/10/2016   Procedure: ANTERIOR CERVICAL DECOMPRESSION FUSION CERVICAL 4-5, CERVICAL 5-6, CERVICAL 6-7 WITH INSTRUMENTATION AND ALLOGRAFT;  Surgeon: [name], MD;  Location: [facility];  Service: Orthopedics;  Laterality: N/A;  . CARDIAC CATHETERIZATION N/A 03/11/2016   Procedure: Left Heart Cath and Coronary Angiography;  Surgeon: [name], MD;  Location: [facility];  Service: Cardiovascular;  Laterality: N/A;  . ICD IMPLANT N/A 06/17/2018   Procedure: ICD IMPLANT;  Surgeon: [name], MD;  Location: [facility];  Service: Cardiovascular;  Laterality: N/A;  . Thumb surgery Right   . VENTRAL HERNIA REPAIR N/A 10/25/2019   Procedure: PRIMARY VENTRAL HERNIA REPAIR;  Surgeon: [name], MD;  Location: [facility];  Service: General;  Laterality: N/A;    ROS: Review of Systems Negative except as stated above  PHYSICAL EXAM: BP 100/63   Pulse 83   Temp 97.8 F (36.6 C)   Resp 16   Wt 173 lb (78.5 kg)   SpO2 97%   BMI 25.55 kg/m   Wt Readings from Last 3 Encounters:  10/30/20 173 lb (78.5 kg)  09/18/20 179 lb (81.2 kg)  08/16/20 177 lb (80.3 kg)    Physical Exam  General appearance - alert, well appearing, and in no distress Mental status - normal mood, behavior, speech, dress, motor activity, and thought processes Neck - supple, no significant adenopathy Chest -breath sounds slightly decreased bilaterally but without wheezes, crackles or rhonchi's Heart - normal rate, regular rhythm, normal S1, S2, no murmurs, rubs, clicks or gallops Extremities -no lower extremity edema. Diabetic Foot Exam - Simple   Simple Foot Form Visual Inspection See  comments: Yes Sensation Testing Intact to touch and monofilament testing bilaterally: Yes Pulse Check Posterior Tibialis and Dorsalis pulse intact bilaterally: Yes Comments Toe nails slightly overgrown.  Hammer toe 2nd toe BL     CMP Latest Ref Rng & Units 07/21/2020 05/16/2020 04/17/2020  Glucose 65 - 99 mg/dL 95 - 103(H)  BUN 8 - 27 mg/dL 22 - 17  Creatinine 0.76 - 1.27 mg/dL 1.37(H) - 1.23  Sodium 134 - 144 mmol/L 133(L) - 138  Potassium 3.5 - 5.2 mmol/L 5.0 - 4.6  Chloride 96 - 106 mmol/L 104 - 106  CO2 20 - 29 mmol/L 15(L) - 21(L)  Calcium 8.6 - 10.2 mg/dL 9.2 - 9.6  Total Protein 6.0 - 8.5 g/dL 7.1 6.6 -  Total Bilirubin 0.0 - 1.2 mg/dL <0.2 0.2 -  Alkaline Phos 44 - 121 IU/L 75 91 -  AST 0 - 40 IU/L 23 17 -  ALT 0 - 44 IU/L 18 17 -   Lipid Panel     Component Value Date/Time   CHOL 153 05/16/2020 0840   TRIG 175 (H) 05/16/2020 0840   HDL 41 05/16/2020 0840   CHOLHDL 3.7 05/16/2020 0840   CHOLHDL 2.9 10/04/2014 1508   VLDL 17 10/04/2014 1508   LDLCALC 82 05/16/2020 0840    CBC    Component Value Date/Time   WBC 6.2 07/21/2020 1235   WBC 7.0 04/17/2020 1405   RBC 4.80 07/21/2020 1235   RBC 5.15 04/17/2020 1405   HGB 12.7 (L) 07/21/2020 1235   HCT 39.3 07/21/2020 1235   PLT 190 07/21/2020 1235   MCV 82 07/21/2020 1235   MCH 26.5 (L) 07/21/2020 1235   MCH 26.0 04/17/2020 1405   MCHC 32.3 07/21/2020 1235   MCHC 31.2 04/17/2020 1405   RDW 15.8 (H) 07/21/2020 1235   LYMPHSABS 1.5 09/25/2019 0331   LYMPHSABS 2.3 02/19/2017 1207   MONOABS 0.4 09/25/2019 0331   EOSABS 0.1 09/25/2019 0331   EOSABS 0.2 02/19/2017 1207   BASOSABS 0.0 09/25/2019 0331   BASOSABS 0.0 02/19/2017 1207    ASSESSMENT AND PLAN: 1. 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  Continue healthy eating habits.  Activity level as tolerated. I will need to touch base with his cardiologist to see if it is okay to prescribe sildenafil as he is on  isosorbide.  I told him that it is contraindicated and recommended referral to urologist in the meantime - POCT glucose (manual entry) - POCT glycosylated hemoglobin (Hb A1C) - Microalbumin / creatinine urine ratio - Ambulatory referral to Urology  2. Dyspnea on exertion I think this is more related to his cardiomyopathy than any underlying obstructive lung disease.  However I will give him a trial of the albuterol again and will have him see Dr. Joya Gaskins pulmonologist. - albuterol (VENTOLIN HFA) 108 (90 Base) MCG/ACT inhaler; INHALE 2 PUFFS INTO THE LUNGS EVERY 6 HOURS AS NEEDED FOR WHEEZING OR SHORTNESS OF BREATH  Dispense: 6.7 g; Refill: 4 - CBC  3. NICM (nonischemic cardiomyopathy) ([place]) Followed by cardiology.  Continue current medications as prescribed by them including carvedilol, Entresto, isosorbide, hydralazine, furosemide,  4. Essential hypertension At goal.  5. Influenza vaccine needed 7. Need for immunization against influenza Given today. - Flu Vaccine QUAD 36+ mos IM  8. COVID-19 vaccine series completed Patient to bring his card with him on next visit for [place] to update his record.    Patient was given the opportunity to ask questions.  Patient verbalized understanding of the plan and was able to repeat key elements of the plan.   Orders Placed This Encounter  Procedures  . Flu Vaccine QUAD 36+ mos IM  . Microalbumin / creatinine urine ratio  . POCT glucose (manual entry)  . POCT glycosylated hemoglobin (Hb A1C)     Requested Prescriptions    No prescriptions requested or ordered in this encounter    No follow-ups on file.  Karle Plumber, MD, FACP

## 2020-10-31 LAB — CBC
Hematocrit: 43 % (ref 37.5–51.0)
Hemoglobin: 13.7 g/dL (ref 13.0–17.7)
MCH: 25.5 pg — ABNORMAL LOW (ref 26.6–33.0)
MCHC: 31.9 g/dL (ref 31.5–35.7)
MCV: 80 fL (ref 79–97)
Platelets: 250 10*3/uL (ref 150–450)
RBC: 5.38 x10E6/uL (ref 4.14–5.80)
RDW: 15.5 % — ABNORMAL HIGH (ref 11.6–15.4)
WBC: 7.1 10*3/uL (ref 3.4–10.8)

## 2020-10-31 LAB — MICROALBUMIN / CREATININE URINE RATIO
Creatinine, Urine: 162.8 mg/dL
Microalb/Creat Ratio: 18 mg/g creat (ref 0–29)
Microalbumin, Urine: 29 ug/mL

## 2020-11-01 ENCOUNTER — Telehealth: Payer: Self-pay

## 2020-11-01 ENCOUNTER — Telehealth: Payer: Self-pay | Admitting: Internal Medicine

## 2020-11-01 NOTE — Telephone Encounter (Signed)
-----   Message from [place], [place] sent at 10/31/2020 10:46 AM EST ----- Hi: Dr. Wynetta Emery. Yes, we do not recommend taking viagra within 48 hours of taking nitrates. I think urology referral is the best choice in this case.   Thank you Isaac Laud  ----- Message ----- From: Ladell Pier, MD Sent: 10/30/2020  12:54 PM EST To: Almyra Deforest, PA  I am the PCP for this patient.  He is having issues with erectile dysfunction.  He wants to be tried on Viagra but he is on isosorbide.  As far as I know this is still contraindicated to prescribe Viagra with nitrates correct?  I have referred him to urology to be considered for the options.

## 2020-11-01 NOTE — Telephone Encounter (Signed)
Phone call placed to patient today.  I informed him that his cardiology team advised against using the Viagra with him being on the isosorbide.  We recommend referral to urology to be considered further options.  Referral was already submitted.  Patient expressed understanding and had no further questions.

## 2020-11-01 NOTE — Telephone Encounter (Signed)
Contacted pt to go over lab results pt is aware and doesn't have any questions or concerns 

## 2020-11-02 ENCOUNTER — Other Ambulatory Visit: Payer: Self-pay | Admitting: Internal Medicine

## 2020-11-02 DIAGNOSIS — E119 Type 2 diabetes mellitus without complications: Secondary | ICD-10-CM

## 2020-11-02 MED FILL — VYNDAMAX 61 MG CAPS: 61 | 30 days supply | Qty: 30 | Fill #1

## 2020-11-06 DIAGNOSIS — M5412 Radiculopathy, cervical region: Secondary | ICD-10-CM | POA: Diagnosis not present

## 2020-11-11 ENCOUNTER — Other Ambulatory Visit (HOSPITAL_COMMUNITY): Payer: Self-pay | Admitting: Internal Medicine

## 2020-11-13 NOTE — Telephone Encounter (Signed)
Advanced Heart Failure Patient Advocate Encounter  Prior Authorization for Jonathan Dickson has been approved.    PA# [id number] Effective dates: 09/18/20 through 09/29/21  Patients co-pay is $0

## 2020-11-20 ENCOUNTER — Ambulatory Visit (INDEPENDENT_AMBULATORY_CARE_PROVIDER_SITE_OTHER): Payer: Medicare Other

## 2020-11-20 ENCOUNTER — Other Ambulatory Visit: Payer: Self-pay

## 2020-11-20 DIAGNOSIS — Z7901 Long term (current) use of anticoagulants: Secondary | ICD-10-CM

## 2020-11-20 DIAGNOSIS — I2699 Other pulmonary embolism without acute cor pulmonale: Secondary | ICD-10-CM

## 2020-11-20 LAB — POCT INR: INR: 2.7 (ref 2.0–3.0)

## 2020-11-20 NOTE — Patient Instructions (Signed)
continue taking 1.5 tablets daily.  Repeat 6 weeks  

## 2020-11-22 ENCOUNTER — Encounter: Payer: Self-pay | Admitting: Podiatry

## 2020-11-22 ENCOUNTER — Other Ambulatory Visit: Payer: Self-pay

## 2020-11-22 ENCOUNTER — Ambulatory Visit (INDEPENDENT_AMBULATORY_CARE_PROVIDER_SITE_OTHER): Payer: Medicare Other | Admitting: Podiatry

## 2020-11-22 DIAGNOSIS — I739 Peripheral vascular disease, unspecified: Secondary | ICD-10-CM

## 2020-11-22 DIAGNOSIS — M79675 Pain in left toe(s): Secondary | ICD-10-CM

## 2020-11-22 DIAGNOSIS — B351 Tinea unguium: Secondary | ICD-10-CM | POA: Diagnosis not present

## 2020-11-22 DIAGNOSIS — M79674 Pain in right toe(s): Secondary | ICD-10-CM

## 2020-11-22 DIAGNOSIS — E119 Type 2 diabetes mellitus without complications: Secondary | ICD-10-CM | POA: Diagnosis not present

## 2020-11-22 DIAGNOSIS — D689 Coagulation defect, unspecified: Secondary | ICD-10-CM | POA: Diagnosis not present

## 2020-11-22 NOTE — Progress Notes (Addendum)
This patient returns to my office for at risk foot care.  This patient requires this care by a professional since this patient will be at risk due to having coagulation defect, diabetes and peripheral claudication.  Patient is taking coumadin.  This patient is unable to cut nails himself since the patient cannot reach his nails.These nails are painful walking and wearing shoes.  This patient presents for at risk foot care today.  General Appearance  Alert, conversant and in no acute stress.  Vascular  Dorsalis pedis and posterior tibial  pulses are weakly palpable  bilaterally.  Capillary return is within normal limits  bilaterally. Cold feet bilaterally. Absent digital hair.  Neurologic  Senn-Weinstein monofilament wire test within normal limits  bilaterally. Muscle power within normal limits bilaterally.  Nails Thick disfigured discolored nails with subungual debris  from hallux to fifth toes bilaterally. No evidence of bacterial infection or drainage bilaterally.  Orthopedic  No limitations of motion  feet .  No crepitus or effusions noted.  No bony pathology or digital deformities noted.  Skin  normotropic skin with no porokeratosis noted bilaterally.  No signs of infections or ulcers noted.     Onychomycosis  Pain in right toes  Pain in left toes  Consent was obtained for treatment procedures.   Mechanical debridement of nails 1-5  bilaterally performed with a nail nipper.  Filed with dremel without incident.    Return office visit   4 months                   Told patient to return for periodic foot care and evaluation due to potential at risk complications.   Gardiner Barefoot DPM

## 2020-11-29 MED FILL — VYNDAMAX 61 MG CAPS: 61 | 30 days supply | Qty: 30 | Fill #2

## 2020-12-10 NOTE — Progress Notes (Signed)
Subjective:    Patient ID: Jonathan Dickson, male    DOB: [birth year], 65 y.o.   MRN: [id number] Virtual Visit via Telephone Note  I connected with Jonathan Dickson on 12/11/20 at  9:00 AM EDT by telephone and verified that I am speaking with the correct person using two identifiers.   Consent:  I discussed the limitations, risks, security and privacy concerns of performing an evaluation and management service by telephone and the availability of in person appointments. I also discussed with the patient that there may be a patient responsible charge related to this service. The patient expressed understanding and agreed to proceed.  Location of patient: Patient's at home  Location of provider: I am in my office at home  Persons participating in the televisit with the patient.   No one else on the call    History of Present Illness:  65 y.o.M with NICM, HTN, cardiac amyloidosis, PE, DVT, T2DM Note I had to convert this to a virtual visit.  The patient does not have technology for video so this was a phone visit This is a 65 year old male who is seen on referral by his primary care Dr. Wynetta Emery for dyspnea and wheezing.  Patient has a history of nonischemic cardiomyopathy congestive dilated cardiomyopathy in the past with imaging studies consistent with cardiac amyloidosis.  Patient's managed by the heart failure team.  Patient also history of severe hypertension as well as pulmonary emboli and deep venous thrombosis.  Patient is on chronic warfarin therapy now.  Patient states despite being on optimal dosing of his heart failure medications which include: Coreg 25 mg twice daily, Entresto 97-1 03 twice daily, isosorbide 30 mg daily, spironolactone 25 mg daily,Furosemide 40 mg daily as needed for edema, hydralazine 25 mg 3 times daily, the patient notes his dyspnea is still present.  He has spells of wheezing and cough productive of thick mucus.  See shortness of breath assessment below.  Patient  does have a 12-pack-year smoking history quitting smoking in 2004.  He does not drink alcohol at this time.  He is due up a Prevnar 13 pneumonia vaccine which is to receive this.  He is up-to-date on all his COVID vaccines and his other vaccinations.  Patient does have type 2 diabetes on Metformin and Farxiga and is controlled.  Note the patient also addition to being on warfarin is on Lovenox 80 mg twice daily.  Note on the CT chest in December 2020 when the patient was seen in the ER for Covid infection there was paraseptal emphysema seen on the CT of the chest  Note at the visit in December 2020 there was no pulmonary emboli seen The patient is yet to have pulmonary function studies and is not on supplemental oxygen.  Shortness of Breath This is a chronic problem. The current episode started more than 1 year ago (worse x 1 yr). The problem occurs daily (daily  w;ak one block and sob, ok at rest). The problem has been gradually worsening. Associated symptoms include headaches, leg pain, orthopnea, PND, rhinorrhea, a sore throat, sputum production and wheezing. Pertinent negatives include no chest pain, claudication, fever, hemoptysis, leg swelling, rash, swollen glands or vomiting. The symptoms are aggravated by weather changes, odors, fumes, URIs, any activity, lying flat, exercise and pollens. Associated symptoms comments: Chest tightness, mucus is yelow, frontal ha dvt left knee. Risk factors include smoking. He has tried beta agonist inhalers (short lasting) for the symptoms. The treatment provided moderate relief.  His past medical history is significant for a heart failure. There is no history of asthma, CAD or COPD.     Past Medical History:  Diagnosis Date  . Abnormal TSH 02/2016  . Anxiety   . Cervical disc disease   . Chronic combined systolic and diastolic CHF (congestive heart failure) ([place])    a. 01/2016 Echo: EF 25%, inf AK, diffuse sev HK, Gr1 DD, mild MR.  . Depression   . History  of hiatal hernia   . Hyperglycemia   . Hypertension   . Hypertensive heart disease   . Myocardial infarction [facility])    ??  maybe  . NICM (nonischemic cardiomyopathy) ([place])    a. 01/2016 Echo: EF 25%, inf AK, diffuse sev HK, Gr1 DD, mild MR; b. 01/2016 MV: EF 22%, no isch/infarct;  c. 02/2016 Cath: Nl cors, EF 35-45%.  . Pneumonia   . Pre-diabetes   . Seizures ([place])    with penicillin     Family History  Problem Relation Age of Onset  . Diabetes Mother   . Diabetes Sister      Social History   Socioeconomic History  . Marital status: Legally Separated    Spouse name: Not on file  . Number of children: 3  . Years of education: Not on file  . Highest education level: Not on file  Occupational History  . Not on file  Tobacco Use  . Smoking status: Former Smoker    Packs/day: 1.50    Years: 8.00    Pack years: 12.00    Quit date: 03/15/2003    Years since quitting: 17.7  . Smokeless tobacco: Never Used  Vaping Use  . Vaping Use: Never used  Substance and Sexual Activity  . Alcohol use: No    Comment: none since 2004  . Drug use: No    Comment: former  none since 2004  . Sexual activity: Not Currently  Other Topics Concern  . Not on file  Social History Narrative  . Not on file   Social Determinants of Health   Financial Resource Strain: Not on file  Food Insecurity: Not on file  Transportation Needs: Not on file  Physical Activity: Not on file  Stress: Not on file  Social Connections: Not on file  Intimate Partner Violence: Not on file     Allergies  Allergen Reactions  . Peanut-Containing Drug Products Swelling  . Penicillins Other (See Comments)    CONVULSIONS Did it involve swelling of the face/tongue/throat, SOB, or low BP? No Did it involve sudden or severe rash/hives, skin peeling, or any reaction on the inside of your mouth or nose? No Did you need to seek medical attention at a hospital or Dickson's office? Yes When did it last happen?25  years If all above answers are "NO", may proceed with cephalosporin use.   . Decadron [Dexamethasone] Itching     Outpatient Medications Prior to Visit  Medication Sig Dispense Refill  . acetaminophen (TYLENOL) 500 MG tablet Take 1,000 mg by mouth every 6 (six) hours as needed for moderate pain or headache.    . albuterol (VENTOLIN HFA) 108 (90 Base) MCG/ACT inhaler INHALE 2 PUFFS INTO THE LUNGS EVERY 6 HOURS AS NEEDED FOR WHEEZING OR SHORTNESS OF BREATH 6.7 g 4  . amitriptyline (ELAVIL) 25 MG tablet TAKE 1 TABLET(25 MG) BY MOUTH AT BEDTIME AS NEEDED FOR SLEEP 60 tablet 0  . Aromatic Inhalants (VICKS VAPOINHALER) INHA Inhale 1 Dose into the lungs daily as  needed (congestion).    Marland Kitchen atorvastatin (LIPITOR) 80 MG tablet Take 1 tablet (80 mg total) by mouth daily. Schedule ov for further refill requests . 30 tablet 0  . carvedilol (COREG) 25 MG tablet TAKE 1 TABLET(25 MG) BY MOUTH TWICE DAILY WITH A MEAL 180 tablet 2  . Chlorphen-Phenyleph-ASA (ALKA-SELTZER PLUS COLD PO) Take 1-2 tablets by mouth 2 (two) times daily as needed (cold symptoms).     . Cholecalciferol (VITAMIN D) 50 MCG (2000 UT) tablet Take 2,000 Units by mouth daily.    . diclofenac Sodium (VOLTAREN) 1 % GEL Apply 2 g topically 4 (four) times daily. 100 g 1  . enoxaparin (LOVENOX) 80 MG/0.8ML injection Inject 0.8 mLs (80 mg total) into the skin every 12 (twelve) hours. 16 mL 1  . ENTRESTO 97-103 MG TAKE 1 TABLET BY MOUTH TWICE DAILY 60 tablet 11  . FARXIGA 10 MG TABS tablet TAKE 1 TABLET BY MOUTH DAILY BEFORE BREAKFAST 30 tablet 5  . gabapentin (NEURONTIN) 300 MG capsule Take by mouth.    Marland Kitchen HYDROcodone-acetaminophen (NORCO/VICODIN) 5-325 MG tablet Take 1 tablet by mouth 2 (two) times daily as needed.    . isosorbide mononitrate (IMDUR) 30 MG 24 hr tablet Take 1 tablet (30 mg total) by mouth at bedtime. Needs appointment for further refill. 90 tablet 0  . metFORMIN (GLUCOPHAGE) 500 MG tablet TAKE 1 TABLET BY MOUTH DAILY WITH BREAKFAST  30 tablet 5  . methocarbamol (ROBAXIN) 500 MG tablet Take 1 tablet (500 mg total) by mouth every 8 (eight) hours as needed for muscle spasms. 15 tablet 0  . spironolactone (ALDACTONE) 25 MG tablet TAKE 1/2 TABLET(12.5 MG) BY MOUTH DAILY 15 tablet 11  . VYNDAMAX 61 MG CAPS TAKE 1 CAPSULE (61 MG) BY MOUTH DAILY. PLEASE DISCONTINUE VYNDAQEL. 30 capsule 11  . warfarin (COUMADIN) 5 MG tablet TAKE 1 TO 2 TABLETS BY MOUTH DAILY AS DIRECTED 90 tablet 0  . hydrALAZINE (APRESOLINE) 50 MG tablet Take 50 mg by mouth 3 (three) times daily.    . furosemide (LASIX) 40 MG tablet Take 1 tablet (40 mg total) by mouth daily as needed for fluid (if weight gain more than 3 punds). 30 tablet 0  . hydrALAZINE (APRESOLINE) 25 MG tablet Take 1 tablet (25 mg total) by mouth 3 (three) times daily. 90 tablet 0   No facility-administered medications prior to visit.     Review of Systems  Constitutional: Negative for fever.  HENT: Positive for rhinorrhea and sore throat.   Respiratory: Positive for sputum production, shortness of breath and wheezing. Negative for hemoptysis.   Cardiovascular: Positive for orthopnea and PND. Negative for chest pain, claudication and leg swelling.  Gastrointestinal: Negative for vomiting.  Skin: Negative for rash.  Neurological: Positive for headaches.       Objective:   Physical Exam No exam this is a phone note       Assessment & Plan:  I personally reviewed all images and lab data in the [facility] system as well as any outside material available during this office visit and agree with the  radiology impressions.   Stage 2 moderate chronic obstructive pulmonary disease by Global Initiative for Chronic Obstructive Lung Disease classification ([place]) History and CT scan imaging most consistent with COPD and a mild stage  Plan is to begin Breo 1 inhalation daily 100 mcg strength  We will obtain full set of pulmonary function studies  We will see this patient back in return follow-up  face-to-face within the  next month    Jonathan Dickson was seen today for shortness of breath.  Diagnoses and all orders for this visit:  Stage 2 moderate chronic obstructive pulmonary disease by Global Initiative for Chronic Obstructive Lung Disease classification ([place]) -     Pulmonary Function Test; Future  Encounter for preprocedure screening laboratory testing for COVID-19 -     Novel Coronavirus, NAA ([facility])  Other orders -     fluticasone furoate-vilanterol (BREO ELLIPTA) 100-25 MCG/INH AEPB; Inhale 1 puff into the lungs daily.   Follow Up Instructions: Patient knows to come in the office for a face-to-face exam within the month and his inhaler was sent to his pharmacy   I discussed the assessment and treatment plan with the patient. The patient was provided an opportunity to ask questions and all were answered. The patient agreed with the plan and demonstrated an understanding of the instructions.   The patient was advised to call back or seek an in-person evaluation if the symptoms worsen or if the condition fails to improve as anticipated.  I provided 30 minutes of non-face-to-face time during this encounter  including  median intraservice time , review of notes, labs, imaging, medications  and explaining diagnosis and management to the patient .    Asencion Noble, MD

## 2020-12-11 ENCOUNTER — Ambulatory Visit: Payer: Medicare Other | Attending: Critical Care Medicine | Admitting: Critical Care Medicine

## 2020-12-11 ENCOUNTER — Other Ambulatory Visit: Payer: Self-pay | Admitting: Cardiovascular Disease

## 2020-12-11 ENCOUNTER — Other Ambulatory Visit: Payer: Self-pay

## 2020-12-11 ENCOUNTER — Encounter: Payer: Self-pay | Admitting: Critical Care Medicine

## 2020-12-11 DIAGNOSIS — Z01812 Encounter for preprocedural laboratory examination: Secondary | ICD-10-CM | POA: Diagnosis not present

## 2020-12-11 DIAGNOSIS — J449 Chronic obstructive pulmonary disease, unspecified: Secondary | ICD-10-CM

## 2020-12-11 DIAGNOSIS — Z20822 Contact with and (suspected) exposure to covid-19: Secondary | ICD-10-CM

## 2020-12-11 MED ORDER — BREO ELLIPTA 100-25 MCG/INH IN AEPB: 1.0000 | INHALATION_SPRAY | Freq: Every day | RESPIRATORY_TRACT | 1 refills | Status: DC

## 2020-12-11 NOTE — Assessment & Plan Note (Signed)
History and CT scan imaging most consistent with COPD and a mild stage  Plan is to begin Breo 1 inhalation daily 100 mcg strength  We will obtain full set of pulmonary function studies  We will see this patient back in return follow-up face-to-face within the next month

## 2020-12-11 NOTE — Progress Notes (Signed)
Pt get out of breath when he walks up and down stairs and while doing activities.

## 2020-12-25 ENCOUNTER — Encounter: Payer: Medicare Other | Admitting: Pharmacist

## 2020-12-26 ENCOUNTER — Other Ambulatory Visit (HOSPITAL_COMMUNITY): Payer: Self-pay

## 2020-12-26 ENCOUNTER — Ambulatory Visit (INDEPENDENT_AMBULATORY_CARE_PROVIDER_SITE_OTHER): Payer: Medicare Other

## 2020-12-26 DIAGNOSIS — I428 Other cardiomyopathies: Secondary | ICD-10-CM

## 2020-12-26 LAB — CUP PACEART REMOTE DEVICE CHECK
Battery Remaining Longevity: 76 mo
Battery Remaining Percentage: 74 %
Battery Voltage: 2.98 V
Brady Statistic RV Percent Paced: 1 %
Date Time Interrogation Session: 20220329021754
HighPow Impedance: 61 Ohm
HighPow Impedance: 61 Ohm
Implantable Lead Implant Date: 20190918
Implantable Lead Location: 753860
Implantable Lead Model: 7122
Implantable Pulse Generator Implant Date: 20190918
Lead Channel Impedance Value: 430 Ohm
Lead Channel Pacing Threshold Amplitude: 0.5 V
Lead Channel Pacing Threshold Pulse Width: 0.5 ms
Lead Channel Sensing Intrinsic Amplitude: 12 mV
Lead Channel Setting Pacing Amplitude: 2.5 V
Lead Channel Setting Pacing Pulse Width: 0.5 ms
Lead Channel Setting Sensing Sensitivity: 0.5 mV
Pulse Gen Serial Number: 9796641

## 2021-01-07 ENCOUNTER — Other Ambulatory Visit: Payer: Self-pay | Admitting: Cardiovascular Disease

## 2021-01-08 ENCOUNTER — Ambulatory Visit (INDEPENDENT_AMBULATORY_CARE_PROVIDER_SITE_OTHER): Payer: Medicare Other

## 2021-01-08 ENCOUNTER — Other Ambulatory Visit: Payer: Self-pay

## 2021-01-08 DIAGNOSIS — I2699 Other pulmonary embolism without acute cor pulmonale: Secondary | ICD-10-CM | POA: Diagnosis not present

## 2021-01-08 DIAGNOSIS — Z7901 Long term (current) use of anticoagulants: Secondary | ICD-10-CM | POA: Diagnosis not present

## 2021-01-08 LAB — POCT INR: INR: 2.6 (ref 2.0–3.0)

## 2021-01-08 NOTE — Patient Instructions (Signed)
continue taking 1.5 tablets daily.  Repeat 6 weeks

## 2021-01-09 NOTE — Progress Notes (Signed)
Remote ICD transmission.   

## 2021-01-15 ENCOUNTER — Other Ambulatory Visit (HOSPITAL_COMMUNITY): Payer: Self-pay

## 2021-01-15 MED FILL — Tafamidis Cap 61 MG: ORAL | 30 days supply | Qty: 30 | Fill #0 | Status: AC

## 2021-01-15 NOTE — Progress Notes (Signed)
Subjective:    Patient ID: Jonathan Dickson, male    DOB: 1955-12-14, 65 y.o.   MRN: [id number]    History of Present Illness:  65 y.o.M with NICM, HTN, cardiac amyloidosis, PE, DVT, T2DM Note I had to convert this to a virtual visit.  The patient does not have technology for video so this was a phone visit This is a 65 year old male who is seen on referral by his primary care Dr. Wynetta Emery for dyspnea and wheezing.  Patient has a history of nonischemic cardiomyopathy congestive dilated cardiomyopathy in the past with imaging studies consistent with cardiac amyloidosis.  Patient's managed by the heart failure team.  Patient also history of severe hypertension as well as pulmonary emboli and deep venous thrombosis.  Patient is on chronic warfarin therapy now.  Patient states despite being on optimal dosing of his heart failure medications which include: Coreg 25 mg twice daily, Entresto 97-1 03 twice daily, isosorbide 30 mg daily, spironolactone 25 mg daily,Furosemide 40 mg daily as needed for edema, hydralazine 25 mg 3 times daily, the patient notes his dyspnea is still present.  He has spells of wheezing and cough productive of thick mucus.  See shortness of breath assessment below.  Patient does have a 12-pack-year smoking history quitting smoking in 2004.  He does not drink alcohol at this time.  He is due up a Prevnar 13 pneumonia vaccine which is to receive this.  He is up-to-date on all his COVID vaccines and his other vaccinations.  Patient does have type 2 diabetes on Metformin and Farxiga and is controlled.  Note the patient also addition to being on warfarin is on Lovenox 80 mg twice daily.  Note on the CT chest in December 2020 when the patient was seen in the ER for Covid infection there was paraseptal emphysema seen on the CT of the chest  Note at the visit in December 2020 there was no pulmonary emboli seen The patient is yet to have pulmonary function studies and is not on supplemental  oxygen.  01/16/21 Patient is seen in return follow-up for COPD stage II.  Patient had CT imaging compatible with COPD history compatible as well.  Patient is now on the [facility] inhaler with some improvement yet he has yet to receive his pulmonary function studies.  Review shortness of breath assessment below   Shortness of Breath This is a chronic problem. The current episode started more than 1 year ago (worse x 1 yr). The problem occurs daily (daily  w;ak one block and sob, ok at rest). The problem has been rapidly improving. Pertinent negatives include no chest pain, claudication, fever, headaches, hemoptysis, leg pain, leg swelling, orthopnea, PND, rash, rhinorrhea, sore throat, sputum production, swollen glands, syncope, vomiting or wheezing. The symptoms are aggravated by weather changes, odors, fumes, URIs, any activity, lying flat, exercise and pollens. Associated symptoms comments: Chest tightness, mucus is yelow, frontal ha dvt left knee. Risk factors include smoking. He has tried beta agonist inhalers (short lasting) for the symptoms. The treatment provided moderate relief. His past medical history is significant for a heart failure. There is no history of asthma, CAD or COPD.     Past Medical History:  Diagnosis Date  . Abnormal TSH 02/2016  . Anxiety   . Cervical disc disease   . Chronic combined systolic and diastolic CHF (congestive heart failure) ([place])    a. 01/2016 Echo: EF 25%, inf AK, diffuse sev HK, Gr1 DD, mild MR.  Marland Kitchen  Depression   . History of hiatal hernia   . Hyperglycemia   . Hypertension   . Hypertensive heart disease   . Myocardial infarction [facility])    ??  maybe  . NICM (nonischemic cardiomyopathy) ([place])    a. 01/2016 Echo: EF 25%, inf AK, diffuse sev HK, Gr1 DD, mild MR; b. 01/2016 MV: EF 22%, no isch/infarct;  c. 02/2016 Cath: Nl cors, EF 35-45%.  . Pneumonia   . Pre-diabetes   . Seizures ([place])    with penicillin     Family History  Problem Relation Age of Onset   . Diabetes Mother   . Diabetes Sister      Social History   Socioeconomic History  . Marital status: Legally Separated    Spouse name: Not on file  . Number of children: 3  . Years of education: Not on file  . Highest education level: Not on file  Occupational History  . Not on file  Tobacco Use  . Smoking status: Former Smoker    Packs/day: 1.50    Years: 8.00    Pack years: 12.00    Quit date: 03/15/2003    Years since quitting: 17.8  . Smokeless tobacco: Never Used  Vaping Use  . Vaping Use: Never used  Substance and Sexual Activity  . Alcohol use: No    Comment: none since 2004  . Drug use: No    Comment: former  none since 2004  . Sexual activity: Not Currently  Other Topics Concern  . Not on file  Social History Narrative  . Not on file   Social Determinants of Health   Financial Resource Strain: Not on file  Food Insecurity: Not on file  Transportation Needs: Not on file  Physical Activity: Not on file  Stress: Not on file  Social Connections: Not on file  Intimate Partner Violence: Not on file     Allergies  Allergen Reactions  . Peanut-Containing Drug Products Swelling  . Penicillins Other (See Comments)    CONVULSIONS Did it involve swelling of the face/tongue/throat, SOB, or low BP? No Did it involve sudden or severe rash/hives, skin peeling, or any reaction on the inside of your mouth or nose? No Did you need to seek medical attention at a hospital or doctor's office? Yes When did it last happen?25 years If all above answers are "NO", may proceed with cephalosporin use.   . Decadron [Dexamethasone] Itching     Outpatient Medications Prior to Visit  Medication Sig Dispense Refill  . acetaminophen (TYLENOL) 500 MG tablet Take 1,000 mg by mouth every 6 (six) hours as needed for moderate pain or headache.    . albuterol (VENTOLIN HFA) 108 (90 Base) MCG/ACT inhaler INHALE 2 PUFFS INTO THE LUNGS EVERY 6 HOURS AS NEEDED FOR WHEEZING OR  SHORTNESS OF BREATH 6.7 g 4  . amitriptyline (ELAVIL) 25 MG tablet TAKE 1 TABLET(25 MG) BY MOUTH AT BEDTIME AS NEEDED FOR SLEEP 60 tablet 0  . Aromatic Inhalants (VICKS VAPOINHALER) INHA Inhale 1 Dose into the lungs daily as needed (congestion).    Marland Kitchen atorvastatin (LIPITOR) 80 MG tablet TAKE 1 TABLET BY MOUTH EVERY DAY**SCHEDULE OFFICE VISIT 30 tablet 8  . carvedilol (COREG) 25 MG tablet TAKE 1 TABLET(25 MG) BY MOUTH TWICE DAILY WITH A MEAL 180 tablet 2  . Chlorphen-Phenyleph-ASA (ALKA-SELTZER PLUS COLD PO) Take 1-2 tablets by mouth 2 (two) times daily as needed (cold symptoms).     . Cholecalciferol (VITAMIN D) 50 MCG (2000  UT) tablet Take 2,000 Units by mouth daily.    . diclofenac Sodium (VOLTAREN) 1 % GEL Apply 2 g topically 4 (four) times daily. 100 g 1  . ENTRESTO 97-103 MG TAKE 1 TABLET BY MOUTH TWICE DAILY 60 tablet 11  . FARXIGA 10 MG TABS tablet TAKE 1 TABLET BY MOUTH DAILY BEFORE BREAKFAST 30 tablet 5  . fluticasone furoate-vilanterol (BREO ELLIPTA) 100-25 MCG/INH AEPB Inhale 1 puff into the lungs daily. 60 each 1  . gabapentin (NEURONTIN) 300 MG capsule Take by mouth.    Marland Kitchen HYDROcodone-acetaminophen (NORCO/VICODIN) 5-325 MG tablet Take 1 tablet by mouth 2 (two) times daily as needed.    . isosorbide mononitrate (IMDUR) 30 MG 24 hr tablet Take 1 tablet (30 mg total) by mouth at bedtime. Needs appointment for further refill. 90 tablet 0  . metFORMIN (GLUCOPHAGE) 500 MG tablet TAKE 1 TABLET BY MOUTH DAILY WITH BREAKFAST 30 tablet 5  . methocarbamol (ROBAXIN) 500 MG tablet Take 1 tablet (500 mg total) by mouth every 8 (eight) hours as needed for muscle spasms. 15 tablet 0  . spironolactone (ALDACTONE) 25 MG tablet TAKE 1/2 TABLET(12.5 MG) BY MOUTH DAILY 15 tablet 11  . Tafamidis 61 MG CAPS TAKE 1 CAPSULE (61 MG) BY MOUTH DAILY. PLEASE DISCONTINUE VYNDAQEL. 30 capsule 11  . warfarin (COUMADIN) 5 MG tablet TAKE 1 TO 2 TABLETS BY MOUTH DAILY AS DIRECTED 90 tablet 0  . enoxaparin (LOVENOX) 80  MG/0.8ML injection Inject 0.8 mLs (80 mg total) into the skin every 12 (twelve) hours. (Patient not taking: Reported on 01/16/2021) 16 mL 1  . furosemide (LASIX) 40 MG tablet Take 1 tablet (40 mg total) by mouth daily as needed for fluid (if weight gain more than 3 punds). 30 tablet 0  . hydrALAZINE (APRESOLINE) 25 MG tablet Take 1 tablet (25 mg total) by mouth 3 (three) times daily. 90 tablet 0   No facility-administered medications prior to visit.     Review of Systems  Constitutional: Negative for fever.  HENT: Negative for rhinorrhea and sore throat.   Respiratory: Positive for shortness of breath. Negative for hemoptysis, sputum production and wheezing.   Cardiovascular: Negative for chest pain, orthopnea, claudication, leg swelling, syncope and PND.  Gastrointestinal: Negative for vomiting.  Skin: Negative for rash.  Neurological: Negative for headaches.       Objective:   Physical Exam Vitals:   01/16/21 1459  BP: 105/69  Pulse: 77  Resp: 18  SpO2: 96%  Weight: 172 lb (78 kg)  Height: 5\' 6"  (1.676 m)    Gen: Pleasant, well-nourished, in no distress,  normal affect  ENT: No lesions,  mouth clear,  oropharynx clear, no postnasal drip  Neck: No JVD, no TMG, no carotid bruits  Lungs: No use of accessory muscles, no dullness to percussion, distant breath sounds without wheezes   cardiovascular: RRR, heart sounds normal, no murmur or gallops, no peripheral edema  Abdomen: soft and NT, no HSM,  BS normal  Musculoskeletal: No deformities, no cyanosis or clubbing  Neuro: alert, non focal  Skin: Warm, no lesions or rashes  No results found.        Assessment & Plan:  I personally reviewed all images and lab data in the [facility] system as well as any outside material available during this office visit and agree with the  radiology impressions.   Stage 2 moderate chronic obstructive pulmonary disease by Global Initiative for Chronic Obstructive Lung Disease  classification (HCC) Continue Breo inhaler for  now Administer Prevnar 13 Pneumovax Obtain full set of pulmonary function studies return after pulmonary functions obtain   Cassidy was seen today for shortness of breath.  Diagnoses and all orders for this visit:  Stage 2 moderate chronic obstructive pulmonary disease by Global Initiative for Chronic Obstructive Lung Disease classification ([place])  Other orders -     Pneumococcal conjugate vaccine 13-valent

## 2021-01-16 ENCOUNTER — Encounter: Payer: Self-pay | Admitting: Critical Care Medicine

## 2021-01-16 ENCOUNTER — Ambulatory Visit: Payer: Medicare Other | Attending: Critical Care Medicine | Admitting: Critical Care Medicine

## 2021-01-16 ENCOUNTER — Other Ambulatory Visit: Payer: Self-pay

## 2021-01-16 DIAGNOSIS — J449 Chronic obstructive pulmonary disease, unspecified: Secondary | ICD-10-CM

## 2021-01-16 DIAGNOSIS — Z23 Encounter for immunization: Secondary | ICD-10-CM

## 2021-01-16 NOTE — Patient Instructions (Signed)
Stay on Breo inhaler one puff daily  A lung function test will be ordered  A prevnar 13 vaccine for pneumonia was given  Return to Dr Joya Gaskins in 6 weeks , this can be virtual visit

## 2021-01-16 NOTE — Assessment & Plan Note (Signed)
Continue Breo inhaler for now Administer Prevnar 13 Pneumovax Obtain full set of pulmonary function studies return after pulmonary functions obtain

## 2021-01-19 ENCOUNTER — Other Ambulatory Visit: Payer: Self-pay

## 2021-01-19 DIAGNOSIS — J449 Chronic obstructive pulmonary disease, unspecified: Secondary | ICD-10-CM

## 2021-01-28 ENCOUNTER — Other Ambulatory Visit (HOSPITAL_COMMUNITY): Payer: Self-pay | Admitting: Internal Medicine

## 2021-02-02 ENCOUNTER — Telehealth: Payer: Self-pay | Admitting: Internal Medicine

## 2021-02-02 DIAGNOSIS — J449 Chronic obstructive pulmonary disease, unspecified: Secondary | ICD-10-CM

## 2021-02-02 DIAGNOSIS — I2699 Other pulmonary embolism without acute cor pulmonale: Secondary | ICD-10-CM

## 2021-02-02 NOTE — Telephone Encounter (Signed)
Error

## 2021-02-02 NOTE — Telephone Encounter (Signed)
Copied from [place] [id number]. Topic: General - Other >> Feb 02, 2021  8:17 AM Celene Kras wrote: Reason for CRM: Pt called stating that he was supposed to have a breathing treatment. He states that at his last appt it was discussed with Dr. Joya Gaskins. Please advise.

## 2021-02-04 ENCOUNTER — Other Ambulatory Visit: Payer: Self-pay | Admitting: Critical Care Medicine

## 2021-02-04 NOTE — Telephone Encounter (Signed)
Requested Prescriptions  Pending Prescriptions Disp Refills  . 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 by Global Initiative for Chronic Obstructive Lung Disease classification [facility])   [place] Elsie Stain, [place]   1 month ago Stage 2 moderate chronic obstructive pulmonary disease by Global Initiative for Chronic Obstructive Lung Disease classification [facility])   [place] Elsie Stain, MD   3 months ago 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

## 2021-02-05 NOTE — Telephone Encounter (Signed)
Patient name and DOB verified.  Informed that he could call [phone], to have PFT done.  Patient verbalized understanding.   Will route to referrals department to see if he can be referred to Pulmonology.

## 2021-02-05 NOTE — Telephone Encounter (Signed)
Patient came by to get more information concerning a breathing treatment. Please advise Pt.

## 2021-02-06 NOTE — Telephone Encounter (Signed)
Provider need to place a Pulmonary referral .  Thank you

## 2021-02-06 NOTE — Telephone Encounter (Signed)
Ref to [place] pulm made

## 2021-02-06 NOTE — Telephone Encounter (Signed)
Dr. Joya Gaskins would you be able to place pulmonary referral..   Pt has PFT appt scheduled

## 2021-02-07 ENCOUNTER — Other Ambulatory Visit (HOSPITAL_COMMUNITY): Payer: Self-pay

## 2021-02-07 MED FILL — Tafamidis Cap 61 MG: ORAL | 30 days supply | Qty: 30 | Fill #1 | Status: AC

## 2021-02-08 ENCOUNTER — Other Ambulatory Visit (HOSPITAL_COMMUNITY): Payer: Self-pay | Admitting: Internal Medicine

## 2021-02-09 ENCOUNTER — Other Ambulatory Visit (HOSPITAL_COMMUNITY): Payer: Self-pay | Admitting: *Deleted

## 2021-02-09 DIAGNOSIS — I5022 Chronic systolic (congestive) heart failure: Secondary | ICD-10-CM

## 2021-02-12 ENCOUNTER — Other Ambulatory Visit (HOSPITAL_COMMUNITY)
Admission: RE | Admit: 2021-02-12 | Discharge: 2021-02-12 | Disposition: A | Discharge status: Home / Self Care | Payer: Medicare Other | Source: Ambulatory Visit | Attending: Critical Care Medicine | Admitting: Critical Care Medicine

## 2021-02-12 DIAGNOSIS — Z01812 Encounter for preprocedural laboratory examination: Secondary | ICD-10-CM | POA: Diagnosis not present

## 2021-02-12 DIAGNOSIS — Z20822 Contact with and (suspected) exposure to covid-19: Secondary | ICD-10-CM | POA: Insufficient documentation

## 2021-02-12 LAB — SARS CORONAVIRUS 2 (TAT 6-24 HRS): SARS Coronavirus 2: NEGATIVE

## 2021-02-13 ENCOUNTER — Other Ambulatory Visit (HOSPITAL_COMMUNITY): Payer: Medicare Other

## 2021-02-15 ENCOUNTER — Ambulatory Visit (HOSPITAL_COMMUNITY)
Admission: RE | Admit: 2021-02-15 | Discharge: 2021-02-15 | Disposition: A | Discharge status: Home / Self Care | Payer: Medicare Other | Source: Ambulatory Visit | Attending: Critical Care Medicine | Admitting: Critical Care Medicine

## 2021-02-15 ENCOUNTER — Other Ambulatory Visit: Payer: Self-pay

## 2021-02-15 DIAGNOSIS — J449 Chronic obstructive pulmonary disease, unspecified: Secondary | ICD-10-CM | POA: Diagnosis not present

## 2021-02-15 LAB — PULMONARY FUNCTION TEST
DL/VA % pred: 86 %
DL/VA: 3.62 ml/min/mmHg/L
DLCO unc % pred: 48 %
DLCO unc: 11.48 ml/min/mmHg
FEF 25-75 Pre: 2.27 L/sec
FEF2575-%Pred-Pre: 97 %
FEV1-%Pred-Pre: 73 %
FEV1-Pre: 1.86 L
FEV1FVC-%Pred-Pre: 108 %
FEV6-%Pred-Pre: 69 %
FEV6-Pre: 2.23 L
FEV6FVC-%Pred-Pre: 104 %
FVC-%Pred-Pre: 66 %
FVC-Pre: 2.23 L
Pre FEV1/FVC ratio: 84 %
Pre FEV6/FVC Ratio: 100 %
RV % pred: 72 %
RV: 1.55 L
TLC % pred: 64 %
TLC: 4.01 L

## 2021-02-15 MED ORDER — ALBUTEROL SULFATE (2.5 MG/3ML) 0.083% IN NEBU: 2.5000 mg | INHALATION_SOLUTION | Freq: Once | RESPIRATORY_TRACT | Status: DC

## 2021-02-19 ENCOUNTER — Other Ambulatory Visit: Payer: Self-pay

## 2021-02-19 ENCOUNTER — Ambulatory Visit (INDEPENDENT_AMBULATORY_CARE_PROVIDER_SITE_OTHER): Payer: Medicare Other

## 2021-02-19 ENCOUNTER — Other Ambulatory Visit: Payer: Self-pay | Admitting: Internal Medicine

## 2021-02-19 DIAGNOSIS — R0600 Dyspnea, unspecified: Secondary | ICD-10-CM

## 2021-02-19 DIAGNOSIS — Z7901 Long term (current) use of anticoagulants: Secondary | ICD-10-CM | POA: Diagnosis not present

## 2021-02-19 DIAGNOSIS — I2699 Other pulmonary embolism without acute cor pulmonale: Secondary | ICD-10-CM | POA: Diagnosis not present

## 2021-02-19 DIAGNOSIS — R0609 Other forms of dyspnea: Secondary | ICD-10-CM

## 2021-02-19 LAB — POCT INR: INR: 2.8 (ref 2.0–3.0)

## 2021-02-19 NOTE — Patient Instructions (Signed)
continue taking 1.5 tablets daily.  Repeat 6 weeks  

## 2021-02-21 ENCOUNTER — Telehealth: Payer: Self-pay

## 2021-02-21 NOTE — Telephone Encounter (Signed)
Patient name and DOB has been verified Patient was informed of lab results. Patient had no questions.  

## 2021-02-21 NOTE — Telephone Encounter (Signed)
-----   Message from Elsie Stain, MD sent at 02/19/2021 12:47 PM EDT ----- Let pt know breathing test was normal  will review with him at his upcoming ov on 5/31

## 2021-02-26 NOTE — Progress Notes (Signed)
Subjective:    Patient ID: Jonathan Dickson, male    DOB: 01/17/1956, 65 y.o.   MRN: [id number]    History of Present Illness:  65 y.o.M with NICM, HTN, cardiac amyloidosis, PE, DVT, T2DM Note I had to convert this to a virtual visit.  The patient does not have technology for video so this was a phone visit This is a 65 year old male who is seen on referral by his primary care Dr. Wynetta Emery for dyspnea and wheezing.  Patient has a history of nonischemic cardiomyopathy congestive dilated cardiomyopathy in the past with imaging studies consistent with cardiac amyloidosis.  Patient's managed by the heart failure team.  Patient also history of severe hypertension as well as pulmonary emboli and deep venous thrombosis.  Patient is on chronic warfarin therapy now.  Patient states despite being on optimal dosing of his heart failure medications which include: Coreg 25 mg twice daily, Entresto 97-1 03 twice daily, isosorbide 30 mg daily, spironolactone 25 mg daily,Furosemide 40 mg daily as needed for edema, hydralazine 25 mg 3 times daily, the patient notes his dyspnea is still present.  He has spells of wheezing and cough productive of thick mucus.  See shortness of breath assessment below.  Patient does have a 12-pack-year smoking history quitting smoking in 2004.  He does not drink alcohol at this time.  He is due up a Prevnar 13 pneumonia vaccine which is to receive this.  He is up-to-date on all his COVID vaccines and his other vaccinations.  Patient does have type 2 diabetes on Metformin and Farxiga and is controlled.  Note the patient also addition to being on warfarin is on Lovenox 80 mg twice daily.  Note on the CT chest in December 2020 when the patient was seen in the ER for Covid infection there was paraseptal emphysema seen on the CT of the chest  Note at the visit in December 2020 there was no pulmonary emboli seen The patient is yet to have pulmonary function studies and is not on supplemental  oxygen.  01/16/21 Patient is seen in return follow-up for COPD stage II.  Patient had CT imaging compatible with COPD history compatible as well.  Patient is now on the [facility] inhaler with some improvement yet he has yet to receive his pulmonary function studies.  Review shortness of breath assessment below  5/31 This patient is seen in return follow-up and was presumed to have chronic airway obstruction and he was on Breo inhaler however he has not been in any use to him.  Pulmonary functions have not been performed and shows more of a restrictive defect.  There is reduction in diffusion capacity residual volume and total lung capacity.  There is no evidence of obstruction on spirometry.  The patient was not able to tolerate having a postbronchodilator phase of the study performed.  Upon further review the patient had COVID-pneumonia in December 2020 and had bilateral infiltrates seen at that time.  His chest x-ray showed atelectasis of the left lung in July 21.  Patient does have still cough and dyspnea with exertion.  He has no real chest pain.  He denies any wheezing.  Shortness of breath is unchanged.  Patient does have a cardiomyopathy which is thought to be due to amyloid deposits  Patient has a follow-up visit with cardiology scheduled  Patient would benefit from repeat imaging of the chest with CT imaging   Shortness of Breath This is a chronic problem. The current episode started  more than 1 year ago (worse x 1 yr). The problem occurs daily (daily  w;ak one block and sob, ok at rest). The problem has been unchanged. Pertinent negatives include no chest pain, claudication, fever, headaches, hemoptysis, leg pain, leg swelling, orthopnea, PND, rash, rhinorrhea, sore throat, sputum production, swollen glands, syncope, vomiting or wheezing. The symptoms are aggravated by weather changes, odors, fumes, URIs, any activity, lying flat, exercise and pollens. Associated symptoms comments: Chest  tightness, mucus is yelow, frontal ha dvt left knee. Risk factors include smoking. He has tried beta agonist inhalers (short lasting) for the symptoms. The treatment provided moderate relief. His past medical history is significant for a heart failure. There is no history of asthma, CAD or COPD.     Past Medical History:  Diagnosis Date  . Abnormal TSH 02/2016  . Anxiety   . Cervical disc disease   . Chronic combined systolic and diastolic CHF (congestive heart failure) ([place])    a. 01/2016 Echo: EF 25%, inf AK, diffuse sev HK, Gr1 DD, mild MR.  . Depression   . History of hiatal hernia   . Hyperglycemia   . Hypertension   . Hypertensive heart disease   . Myocardial infarction [facility])    ??  maybe  . NICM (nonischemic cardiomyopathy) ([place])    a. 01/2016 Echo: EF 25%, inf AK, diffuse sev HK, Gr1 DD, mild MR; b. 01/2016 MV: EF 22%, no isch/infarct;  c. 02/2016 Cath: Nl cors, EF 35-45%.  . Pneumonia   . Pre-diabetes   . Seizures ([place])    with penicillin     Family History  Problem Relation Age of Onset  . Diabetes Mother   . Diabetes Sister      Social History   Socioeconomic History  . Marital status: Legally Separated    Spouse name: Not on file  . Number of children: 3  . Years of education: Not on file  . Highest education level: Not on file  Occupational History  . Not on file  Tobacco Use  . Smoking status: Former Smoker    Packs/day: 1.50    Years: 8.00    Pack years: 12.00    Quit date: 03/15/2003    Years since quitting: 17.9  . Smokeless tobacco: Never Used  Vaping Use  . Vaping Use: Never used  Substance and Sexual Activity  . Alcohol use: No    Comment: none since 2004  . Drug use: No    Comment: former  none since 2004  . Sexual activity: Not Currently  Other Topics Concern  . Not on file  Social History Narrative  . Not on file   Social Determinants of Health   Financial Resource Strain: Not on file  Food Insecurity: Not on file  Transportation  Needs: Not on file  Physical Activity: Not on file  Stress: Not on file  Social Connections: Not on file  Intimate Partner Violence: Not on file     Allergies  Allergen Reactions  . Peanut-Containing Drug Products Swelling  . Penicillins Other (See Comments)    CONVULSIONS Did it involve swelling of the face/tongue/throat, SOB, or low BP? No Did it involve sudden or severe rash/hives, skin peeling, or any reaction on the inside of your mouth or nose? No Did you need to seek medical attention at a hospital or doctor's office? Yes When did it last happen?25 years If all above answers are "NO", may proceed with cephalosporin use.   . Decadron [Dexamethasone] Itching  Outpatient Medications Prior to Visit  Medication Sig Dispense Refill  . amitriptyline (ELAVIL) 25 MG tablet TAKE 1 TABLET(25 MG) BY MOUTH AT BEDTIME AS NEEDED FOR SLEEP 60 tablet 0  . atorvastatin (LIPITOR) 80 MG tablet TAKE 1 TABLET BY MOUTH EVERY DAY**SCHEDULE OFFICE VISIT 30 tablet 8  . carvedilol (COREG) 25 MG tablet TAKE 1 TABLET(25 MG) BY MOUTH TWICE DAILY WITH A MEAL 180 tablet 2  . Chlorphen-Phenyleph-ASA (ALKA-SELTZER PLUS COLD PO) Take 1-2 tablets by mouth 2 (two) times daily as needed (cold symptoms).     . Cholecalciferol (VITAMIN D) 50 MCG (2000 UT) tablet Take 2,000 Units by mouth daily.    . dapagliflozin propanediol (FARXIGA) 10 MG TABS tablet Take 1 tablet (10 mg total) by mouth daily before breakfast. Needs appt for further refills 30 tablet 2  . diclofenac Sodium (VOLTAREN) 1 % GEL Apply 2 g topically 4 (four) times daily. 100 g 1  . ENTRESTO 97-103 MG TAKE 1 TABLET BY MOUTH TWICE DAILY 60 tablet 11  . furosemide (LASIX) 40 MG tablet Take 1 tablet (40 mg total) by mouth daily as needed for fluid (if weight gain more than 3 punds). 30 tablet 0  . isosorbide mononitrate (IMDUR) 30 MG 24 hr tablet TAKE 1 TABLET(30 MG) BY MOUTH AT BEDTIME 90 tablet 0  . metFORMIN (GLUCOPHAGE) 500 MG tablet TAKE 1  TABLET BY MOUTH DAILY WITH BREAKFAST 30 tablet 5  . methocarbamol (ROBAXIN) 500 MG tablet Take 1 tablet (500 mg total) by mouth every 8 (eight) hours as needed for muscle spasms. 15 tablet 0  . spironolactone (ALDACTONE) 25 MG tablet TAKE 1/2 TABLET(12.5 MG) BY MOUTH DAILY 15 tablet 11  . Tafamidis 61 MG CAPS TAKE 1 CAPSULE (61 MG) BY MOUTH DAILY. PLEASE DISCONTINUE VYNDAQEL. 30 capsule 11  . warfarin (COUMADIN) 5 MG tablet TAKE 1 TO 2 TABLETS BY MOUTH DAILY AS DIRECTED 90 tablet 0  . albuterol (VENTOLIN HFA) 108 (90 Base) MCG/ACT inhaler INHALE 2 PUFFS INTO LUNG EVERY 6 HRS AS NEEDED SHORTNESS OF BREATH OR WHEEZING 6.7 g 0  . BREO ELLIPTA 100-25 MCG/INH AEPB INHALE 1 PUFF INTO THE LUNGS DAILY 60 each 1  . acetaminophen (TYLENOL) 500 MG tablet Take 1,000 mg by mouth every 6 (six) hours as needed for moderate pain or headache.    . Aromatic Inhalants (VICKS VAPOINHALER) INHA Inhale 1 Dose into the lungs daily as needed (congestion).    . gabapentin (NEURONTIN) 300 MG capsule Take by mouth. (Patient not taking: Reported on 02/27/2021)    . hydrALAZINE (APRESOLINE) 25 MG tablet Take 1 tablet (25 mg total) by mouth 3 (three) times daily. 90 tablet 0  . HYDROcodone-acetaminophen (NORCO/VICODIN) 5-325 MG tablet Take 1 tablet by mouth 2 (two) times daily as needed. (Patient not taking: Reported on 02/27/2021)    . enoxaparin (LOVENOX) 80 MG/0.8ML injection Inject 0.8 mLs (80 mg total) into the skin every 12 (twelve) hours. (Patient not taking: No sig reported) 16 mL 1   No facility-administered medications prior to visit.     Review of Systems  Constitutional: Negative for fever.  HENT: Negative for rhinorrhea and sore throat.   Respiratory: Positive for shortness of breath. Negative for hemoptysis, sputum production and wheezing.   Cardiovascular: Negative for chest pain, orthopnea, claudication, leg swelling, syncope and PND.  Gastrointestinal: Negative for vomiting.  Skin: Negative for rash.   Neurological: Negative for headaches.       Objective:   Physical Exam Vitals:  02/27/21 1353  BP: 102/64  Pulse: 90  Resp: (!) 24  SpO2: 97%  Weight: 164 lb (74.4 kg)    Gen: Pleasant, well-nourished, in no distress,  normal affect  ENT: No lesions,  mouth clear,  oropharynx clear, no postnasal drip  Neck: No JVD, no TMG, no carotid bruits  Lungs: No use of accessory muscles, no dullness to percussion, no wheezes  cardiovascular: RRR, heart sounds normal, no murmur or gallops, no peripheral edema  Abdomen: soft and NT, no HSM,  BS normal  Musculoskeletal: No deformities, no cyanosis or clubbing  Neuro: alert, non focal  Skin: Warm, no lesions or rashes  No results found.        Assessment & Plan:  I personally reviewed all images and lab data in the [facility] system as well as any outside material available during this office visit and agree with the  radiology impressions.   Interstitial pulmonary fibrosis ([place]) Pulmonary function show a restrictive defect  Working diagnosis is that of interstitial lung disease secondary to [place] is to obtain CT of the chest without contrast for further definition of lung architecture  Plan is to discontinue Breo and Ventolin as he does not have an obstructive defect  May ultimately need referral to pulmonary medicine for further treatment planning if he does bear out to have interstitial lung disease  Patient doing continue his follow-up with cardiology regarding his amyloid heart   Jonathan Dickson was seen today for follow-up.  Diagnoses and all orders for this visit:  Interstitial pulmonary fibrosis ([place]) -     CT Chest Wo Contrast; Future  Cardiac amyloidosis ([place])

## 2021-02-27 ENCOUNTER — Other Ambulatory Visit: Payer: Self-pay

## 2021-02-27 ENCOUNTER — Ambulatory Visit: Payer: Medicare Other | Admitting: Internal Medicine

## 2021-02-27 ENCOUNTER — Ambulatory Visit: Payer: Medicare Other | Attending: Critical Care Medicine | Admitting: Critical Care Medicine

## 2021-02-27 ENCOUNTER — Encounter: Payer: Self-pay | Admitting: Critical Care Medicine

## 2021-02-27 VITALS — BP 102/64 | HR 90 | Resp 24 | Wt 164.0 lb

## 2021-02-27 DIAGNOSIS — E854 Organ-limited amyloidosis: Secondary | ICD-10-CM | POA: Diagnosis not present

## 2021-02-27 DIAGNOSIS — J8410 Pulmonary fibrosis, unspecified: Secondary | ICD-10-CM | POA: Insufficient documentation

## 2021-02-27 DIAGNOSIS — I43 Cardiomyopathy in diseases classified elsewhere: Secondary | ICD-10-CM | POA: Diagnosis not present

## 2021-02-27 DIAGNOSIS — M5412 Radiculopathy, cervical region: Secondary | ICD-10-CM | POA: Diagnosis not present

## 2021-02-27 NOTE — Patient Instructions (Addendum)
Discontinue Breo and Ventolin  A CT of the chest without contrast will be obtained  Keep your upcoming appointment with cardiology  We will call you with results of the CT of the chest, if this shows significant scarring in the lungs we will likely refer you to the pulmonary clinic for further evaluation

## 2021-02-27 NOTE — Progress Notes (Signed)
F/u with pulmonary function States he had black spells during the procedure

## 2021-02-27 NOTE — Assessment & Plan Note (Addendum)
Pulmonary function show a restrictive defect  Working diagnosis is that of interstitial lung disease secondary to [place] is to obtain CT of the chest without contrast for further definition of lung architecture  Plan is to discontinue Breo and Ventolin as he does not have an obstructive defect  May ultimately need referral to pulmonary medicine for further treatment planning if he does bear out to have interstitial lung disease  Patient doing continue his follow-up with cardiology regarding his amyloid heart

## 2021-03-05 ENCOUNTER — Other Ambulatory Visit (HOSPITAL_COMMUNITY): Payer: Self-pay

## 2021-03-05 MED FILL — Tafamidis Cap 61 MG: ORAL | 30 days supply | Qty: 30 | Fill #2 | Status: AC

## 2021-03-06 ENCOUNTER — Ambulatory Visit: Payer: Medicare Other | Attending: Internal Medicine | Admitting: Internal Medicine

## 2021-03-06 ENCOUNTER — Encounter: Payer: Self-pay | Admitting: Internal Medicine

## 2021-03-06 ENCOUNTER — Ambulatory Visit: Payer: Medicare Other | Admitting: Internal Medicine

## 2021-03-06 ENCOUNTER — Other Ambulatory Visit: Payer: Self-pay

## 2021-03-06 VITALS — BP 108/64 | HR 94 | Resp 16 | Ht 64.0 in | Wt 174.4 lb

## 2021-03-06 DIAGNOSIS — Z23 Encounter for immunization: Secondary | ICD-10-CM

## 2021-03-06 DIAGNOSIS — Z0000 Encounter for general adult medical examination without abnormal findings: Secondary | ICD-10-CM | POA: Diagnosis not present

## 2021-03-06 DIAGNOSIS — E663 Overweight: Secondary | ICD-10-CM | POA: Diagnosis not present

## 2021-03-06 NOTE — Progress Notes (Signed)
Subjective:   Jonathan Dickson is a 65 y.o. male who presents for a Welcome to Medicare exam.  Pt with hx of NICM, chronic systolic CHF with recent EF of35-40%, ICD,,cardiac amyloidosisHTN, DM,BL PE/DVT LLE(plan for lifelong anticoagulation, hx of recurrent), insomnia, hx of COVID with DVT  Review of Systems: Wants chiropractor but not sure if they [facility]       Objective:    Today's Vitals   03/06/21 1520  BP: 108/64  Pulse: 94  Resp: 16  SpO2: 96%  Weight: 174 lb 6.4 oz (79.1 kg)  Height: 5\' 4"  (1.626 m)   Body mass index is 29.94 kg/m. Constitutional: Appears well-developed and well-nourished. No distress. Head: Normocephalic. Atraumatic Neck: Neck supple.  No tracheal deviation. No thyromegaly. No cervical LN CVS: RRR, S1/S2 +, no murmurs, no gallops, no carotid bruit. No JVD Pulmonary: Effort and breath sounds normal, no stridor, rhonchi, wheezes, rales.  Psychiatric: Normal mood and affect. Behavior, judgment, thought content normal.     Medications Outpatient Encounter Medications as of 03/06/2021  Medication Sig  . acetaminophen (TYLENOL) 500 MG tablet Take 1,000 mg by mouth every 6 (six) hours as needed for moderate pain or headache.  Marland Kitchen amitriptyline (ELAVIL) 25 MG tablet TAKE 1 TABLET(25 MG) BY MOUTH AT BEDTIME AS NEEDED FOR SLEEP  . Aromatic Inhalants (VICKS VAPOINHALER) INHA Inhale 1 Dose into the lungs daily as needed (congestion).  Marland Kitchen atorvastatin (LIPITOR) 80 MG tablet TAKE 1 TABLET BY MOUTH EVERY DAY**SCHEDULE OFFICE VISIT  . carvedilol (COREG) 25 MG tablet TAKE 1 TABLET(25 MG) BY MOUTH TWICE DAILY WITH A MEAL  . Chlorphen-Phenyleph-ASA (ALKA-SELTZER PLUS COLD PO) Take 1-2 tablets by mouth 2 (two) times daily as needed (cold symptoms).   . Cholecalciferol (VITAMIN D) 50 MCG (2000 UT) tablet Take 2,000 Units by mouth daily.  . dapagliflozin propanediol (FARXIGA) 10 MG TABS tablet Take 1 tablet (10 mg total) by mouth daily before breakfast. Needs appt  for further refills  . diclofenac Sodium (VOLTAREN) 1 % GEL Apply 2 g topically 4 (four) times daily.  Marland Kitchen ENTRESTO 97-103 MG TAKE 1 TABLET BY MOUTH TWICE DAILY  . furosemide (LASIX) 40 MG tablet Take 1 tablet (40 mg total) by mouth daily as needed for fluid (if weight gain more than 3 punds).  . gabapentin (NEURONTIN) 300 MG capsule Take by mouth. (Patient not taking: Reported on 02/27/2021)  . hydrALAZINE (APRESOLINE) 25 MG tablet Take 1 tablet (25 mg total) by mouth 3 (three) times daily.  Marland Kitchen HYDROcodone-acetaminophen (NORCO/VICODIN) 5-325 MG tablet Take 1 tablet by mouth 2 (two) times daily as needed. (Patient not taking: Reported on 02/27/2021)  . isosorbide mononitrate (IMDUR) 30 MG 24 hr tablet TAKE 1 TABLET(30 MG) BY MOUTH AT BEDTIME  . metFORMIN (GLUCOPHAGE) 500 MG tablet TAKE 1 TABLET BY MOUTH DAILY WITH BREAKFAST  . methocarbamol (ROBAXIN) 500 MG tablet Take 1 tablet (500 mg total) by mouth every 8 (eight) hours as needed for muscle spasms.  Marland Kitchen spironolactone (ALDACTONE) 25 MG tablet TAKE 1/2 TABLET(12.5 MG) BY MOUTH DAILY  . Tafamidis 61 MG CAPS TAKE 1 CAPSULE (61 MG) BY MOUTH DAILY. PLEASE DISCONTINUE VYNDAQEL.  Marland Kitchen warfarin (COUMADIN) 5 MG tablet TAKE 1 TO 2 TABLETS BY MOUTH DAILY AS DIRECTED   No facility-administered encounter medications on file as of 03/06/2021.     History: Past Medical History:  Diagnosis Date  . Abnormal TSH 02/2016  . Anxiety   . Cervical disc disease   . Chronic  combined systolic and diastolic CHF (congestive heart failure) ([place])    a. 01/2016 Echo: EF 25%, inf AK, diffuse sev HK, Gr1 DD, mild MR.  . Depression   . History of hiatal hernia   . Hyperglycemia   . Hypertension   . Hypertensive heart disease   . Myocardial infarction [facility])    ??  maybe  . NICM (nonischemic cardiomyopathy) ([place])    a. 01/2016 Echo: EF 25%, inf AK, diffuse sev HK, Gr1 DD, mild MR; b. 01/2016 MV: EF 22%, no isch/infarct;  c. 02/2016 Cath: Nl cors, EF 35-45%.  . Pneumonia   .  Pre-diabetes   . Seizures ([place])    with penicillin   Past Surgical History:  Procedure Laterality Date  . ANTERIOR CERVICAL DECOMP/DISCECTOMY FUSION N/A 07/10/2016   Procedure: ANTERIOR CERVICAL DECOMPRESSION FUSION CERVICAL 4-5, CERVICAL 5-6, CERVICAL 6-7 WITH INSTRUMENTATION AND ALLOGRAFT;  Surgeon: [name], MD;  Location: [place];  Service: Orthopedics;  Laterality: N/A;  . CARDIAC CATHETERIZATION N/A 03/11/2016   Procedure: Left Heart Cath and Coronary Angiography;  Surgeon: [name], MD;  Location: [place] CV [facility];  Service: Cardiovascular;  Laterality: N/A;  . ICD IMPLANT N/A 06/17/2018   Procedure: ICD IMPLANT;  Surgeon: [name], MD;  Location: [place] [facility];  Service: Cardiovascular;  Laterality: N/A;  . Thumb surgery Right   . VENTRAL HERNIA REPAIR N/A 10/25/2019   Procedure: PRIMARY VENTRAL HERNIA REPAIR;  Surgeon: [name], MD;  Location: [facility];  Service: General;  Laterality: N/A;    Family History  Problem Relation Age of Onset  . Diabetes Mother   . Diabetes Sister    Social History   Occupational History  . Not on file  Tobacco Use  . Smoking status: Former Smoker    Packs/day: 1.50    Years: 8.00    Pack years: 12.00    Quit date: 03/15/2003    Years since quitting: 17.9  . Smokeless tobacco: Never Used  Vaping Use  . Vaping Use: Never used  Substance and Sexual Activity  . Alcohol use: No    Comment: none since 2004  . Drug use: No    Comment: former  none since 2004  . Sexual activity: Not Currently   Tobacco Counseling -Patient quit smoking over 18 years ago.  Immunizations and Health Maintenance Immunization History  Administered Date(s) Administered  . Influenza,inj,Quad PF,6+ Mos 05/21/2016, 06/21/2019, 10/30/2020  . Moderna Sars-Covid-2 Vaccination 11/25/2019, 12/23/2019, 07/12/2020  . Pneumococcal Conjugate-13 01/16/2021  . Pneumococcal Polysaccharide-23 05/21/2016  . Tdap 11/26/2016, 02/15/2019    Health Maintenance Due  Topic Date Due  . Pneumococcal Vaccine 32-7 Years old (1 of 4 - PCV13) Never done  . Zoster Vaccines- Shingrix (1 of 2) Never done    Activities of Daily Living In your present state of health, do you have any difficulty performing the following activities: 03/06/2021  Hearing? N  Vision? N  Difficulty concentrating or making decisions? N  Walking or climbing stairs? N  Dressing or bathing? N  Doing errands, shopping? N  Preparing Food and eating ? N  Using the Toilet? N  In the past six months, have you accidently leaked urine? N  Do you have problems with loss of bowel control? N  Managing your Medications? N  Managing your Finances? N  Housekeeping or managing your Housekeeping? N  Some recent data might be hidden    Advanced Directives: Does Patient Have a Medical Advance Directive?: Yes  Type of Advance Directive: [place] will [place] in Chart?: No - copy requested    Assessment:    This is a routine wellness  examination for this patient .  Vision/Hearing screen Whisper test is normal.  Hearing Screening   125Hz  250Hz  500Hz  1000Hz  2000Hz  3000Hz  4000Hz  6000Hz  8000Hz   Right ear:           Left ear:             Visual Acuity Screening   Right eye Left eye Both eyes  Without correction:     With correction: 20 20 20 20 20 20     Dietary issues and exercise activities discussed:  Small portions.  Drinks water and regular Coke four 16 oz bottles a day Goes to [employer] 2 days a wk.  Works out on [employer] and does some [employer]   Goals   None    Wants to get breathing under control  Depression Screen PHQ 2/9 Scores 03/06/2021 01/16/2021 10/30/2020 11/04/2019  PHQ - 2 Score 0 0 0 0  PHQ- 9 Score 0 0 - -     Fall Risk Fall Risk  03/06/2021  Falls in the past year? 0  Number falls in past yr: 0  Injury with Fall? 0  Risk for fall due to : No Fall Risks    Cognitive  Function MMSE - Mini Mental State Exam 03/06/2021  Orientation to time 5  Orientation to Place 5  Registration 3  Attention/ Calculation 5  Recall 3  Language- name 2 objects 2  Language- repeat 1  Language- follow 3 step command 3  Language- read & follow direction 1  Write a sentence 1  Copy design 0  Total score 29        Patient Care Team: Ladell Pier, MD as PCP - General (Internal Medicine) Lorretta Harp, MD as PCP - Cardiology (Cardiology) [name], MD as PCP - Electrophysiology (Cardiology) Bensimhon, Shaune Pascal, MD as PCP - Advanced Heart Failure (Cardiology) Brunetta Genera, MD as Consulting Physician (Hematology)     Plan:  1. Encounter for Medicare annual wellness exam Patient will bring a copy of his living will for our records.  2. Need for shingles vaccine Given today by CMA Pollick.  3. Over weight Dietary counseling given. We did the math on how much calories he is consuming when he drinks 4 16 ounce bottles of Coca-Cola a day.  I have encouraged him to try to eliminate sodas from his diet completely.  If he needs to do a slow taper, I recommend that he change to diet Coke and no more than 2 a day.    I have personally reviewed and noted the following in the patient's chart:   . Medical and social history . Use of alcohol, tobacco or illicit drugs  . Current medications and supplements . Functional ability and status . Nutritional status . Physical activity . Advanced directives . List of other physicians . Hospitalizations, surgeries, and ER visits in previous 12 months . Vitals . Screenings to include cognitive, depression, and falls . Referrals and appointments  In addition, I have reviewed and discussed with patient certain preventive protocols, quality metrics, and best practice recommendations. A written personalized care plan for preventive services as well as general preventive health recommendations were provided  to patient.    Karle Plumber, MD 03/06/2021

## 2021-03-06 NOTE — Patient Instructions (Signed)
Try to eliminate the sodas from your diet.  If you need to do it gradually, try to cut back to no more than Coke a day.  If you can do the diet Coke, please do so.  I will check with Dr. Joya Gaskins CMA to make sure that the CAT scan of the chest is scheduled for you.

## 2021-03-07 ENCOUNTER — Ambulatory Visit (INDEPENDENT_AMBULATORY_CARE_PROVIDER_SITE_OTHER): Payer: Medicare Other | Admitting: Internal Medicine

## 2021-03-07 ENCOUNTER — Other Ambulatory Visit (HOSPITAL_COMMUNITY): Payer: Self-pay

## 2021-03-07 ENCOUNTER — Telehealth: Payer: Self-pay

## 2021-03-07 ENCOUNTER — Encounter: Payer: Self-pay | Admitting: Internal Medicine

## 2021-03-07 VITALS — BP 104/60 | HR 78 | Ht 64.0 in | Wt 171.0 lb

## 2021-03-07 DIAGNOSIS — U071 COVID-19: Secondary | ICD-10-CM | POA: Diagnosis not present

## 2021-03-07 DIAGNOSIS — Z87891 Personal history of nicotine dependence: Secondary | ICD-10-CM

## 2021-03-07 DIAGNOSIS — J849 Interstitial pulmonary disease, unspecified: Secondary | ICD-10-CM

## 2021-03-07 NOTE — Patient Instructions (Signed)
The patient should have follow up scheduled with myself in 3 months.   Prior to next visit patient should have: CT scan - ordered today. We will contact you with the results and next steps.

## 2021-03-07 NOTE — Telephone Encounter (Signed)
Patient was seen by Pulmonology and CT order was placed today.

## 2021-03-07 NOTE — Addendum Note (Signed)
Addended by: Boykin Reaper R on: 03/07/2021 11:18 AM   Modules accepted: Orders

## 2021-03-07 NOTE — Telephone Encounter (Signed)
-----   Message from Elsie Stain, MD sent at 03/07/2021  6:22 AM EDT ----- Yes he needs this study   I ordered it with his 5/31 OV ----- Message ----- From: Ladell Pier, MD Sent: 03/06/2021   5:42 PM EDT To: Elsie Stain, MD, Doloris Hall V, CMA  Dr. Joya Gaskins has ordered a CAT scan of the chest.  Patient states it has not been scheduled for him as yet.  Please schedule.

## 2021-03-07 NOTE — Progress Notes (Signed)
Jonathan Dickson    [id number]    [birth year]  Primary Care Physician:Johnson, Dalbert Batman, MD  Referring Physician: Elsie Stain, MD [address],  [place] [address] Reason for Consultation: shortness of breath Date of Consultation: 03/07/2021  Chief complaint:   Chief Complaint  Patient presents with  . Consult    Bilateral pulmonary embolism     HPI: Jonathan Dickson is a 65 y.o. gentleman who is here for dyspnea evaluation and possible ILD. Past medical history of CAD s/p ICD placement in 2019. EF 35%.  He had covid in 2020 and since then has had profound dyspnea. Has to stop and rest after climbing three flights of stairs. Has associated wheezing and coughing, no mucus production. He takes vicks intra-nasal and was given a trial of maintenance Breo inhaler which did not help. He thinks the albuterol inhaler prn helps a little bit. 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 He was on eliquis and completed 6 months of anticoagulation and then had a DVT. He is now on warfarin. No family history of blood clots. No  Prolonged immobility at the time of events. Felt to be unprovoked.   No weight loss, fevers, chills, night sweats. Appetite is ok.   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 Up to date on cancer screening - colonoscopy, prostate.   No bleeding issues on warfarin. No missed doses.    Social history:  Occupation: retired from having a catering business.  Exposures: lives at home alone, sometimes gets sob with ADLs. Has a pet bearded dragon.  Smoking history: former smoking.   Social History   Occupational History  . Not on file  Tobacco Use  . Smoking status: Former Smoker    Packs/day: 1.50    Years: 18.00    Pack years: 27.00    Quit date: 03/15/2003    Years since quitting: 17.9  . Smokeless tobacco: Never Used  Vaping Use  . Vaping Use: Never used  Substance and Sexual Activity   . Alcohol use: No    Comment: none since 2004  . Drug use: No    Comment: former  none since 2004  . Sexual activity: Not Currently    Relevant family history: Family History  Problem Relation Age of Onset  . Diabetes Mother   . Diabetes Sister   . Clotting disorder Neg Hx   . Lung disease Neg Hx     Past Medical History:  Diagnosis Date  . Abnormal TSH 02/2016  . Anxiety   . Cervical disc disease   . Chronic combined systolic and diastolic CHF (congestive heart failure) ([place])    a. 01/2016 Echo: EF 25%, inf AK, diffuse sev HK, Gr1 DD, mild MR.  . Depression   . History of hiatal hernia   . Hyperglycemia   . Hypertension   . Hypertensive heart disease   . Myocardial infarction [facility])    ??  maybe  . NICM (nonischemic cardiomyopathy) ([place])    a. 01/2016 Echo: EF 25%, inf AK, diffuse sev HK, Gr1 DD, mild MR; b. 01/2016 MV: EF 22%, no isch/infarct;  c. 02/2016 Cath: Nl cors, EF 35-45%.  . Pneumonia   . Pre-diabetes   . Seizures ([place])    with penicillin    Past Surgical History:  Procedure Laterality Date  . ANTERIOR CERVICAL DECOMP/DISCECTOMY FUSION  N/A 07/10/2016   Procedure: ANTERIOR CERVICAL DECOMPRESSION FUSION CERVICAL 4-5, CERVICAL 5-6, CERVICAL 6-7 WITH INSTRUMENTATION AND ALLOGRAFT;  Surgeon: [name], MD;  Location: [place];  Service: Orthopedics;  Laterality: N/A;  . CARDIAC CATHETERIZATION N/A 03/11/2016   Procedure: Left Heart Cath and Coronary Angiography;  Surgeon: [name], MD;  Location: [place] [facility];  Service: Cardiovascular;  Laterality: N/A;  . ICD IMPLANT N/A 06/17/2018   Procedure: ICD IMPLANT;  Surgeon: [name], MD;  Location: [place] [facility];  Service: Cardiovascular;  Laterality: N/A;  . Thumb surgery Right   . VENTRAL HERNIA REPAIR N/A 10/25/2019   Procedure: PRIMARY VENTRAL HERNIA REPAIR;  Surgeon: [name], MD;  [facility];  Service: General;  Laterality: N/A;    Physical Exam: Blood pressure 104/60,  pulse 78, height 5\' 4"  (1.626 m), weight 171 lb (77.6 kg), SpO2 99 %. Gen:      No acute distress ENT:  no nasal polyps, mucus membranes moist Lungs:    No increased respiratory effort, symmetric chest wall excursion, clear to auscultation bilaterally, no wheezes or crackles CV:         Regular rate and rhythm; no murmurs, rubs, or gallops.  No pedal edema Abd:      + bowel sounds; soft, non-tender; no distension MSK: no acute synovitis of DIP or PIP joints, no mechanics hands.  Skin:      Warm and dry; no rashes Neuro: normal speech, no focal facial asymmetry Psych: alert and oriented x3, normal mood and affect   Data Reviewed/Medical Decision Making:  Independent interpretation of tests: Imaging: . Review of patient's CTPE study 2020 images revealed no acute PE, mild bilateral GGOs, no fibrosis. The patient's images have been independently reviewed by me.    PFTs: I have personally reviewed the patient's PFTs and show no airflow limitation but show mild restriction to ventilation with reduced diffusion capacity. PFT Results Latest Ref Rng & Units 01/19/2021  FVC-Pre L 2.23  FVC-Predicted Pre % 66  Pre FEV1/FVC % % 84  FEV1-Pre L 1.86  FEV1-Predicted Pre % 73  DLCO uncorrected ml/min/mmHg 11.48  DLCO UNC% % 48  DLVA Predicted % 86  TLC L 4.01  TLC % Predicted % 64  RV % Predicted % 72    Labs:  Lab Results  Component Value Date   WBC 7.1 10/30/2020   HGB 13.7 10/30/2020   HCT 43.0 10/30/2020   MCV 80 10/30/2020   PLT 250 10/30/2020   Lab Results  Component Value Date   NA 133 (L) 07/21/2020   K 5.0 07/21/2020   CL 104 07/21/2020   CO2 15 (L) 07/21/2020     Immunization status:  Immunization History  Administered Date(s) Administered  . Influenza,inj,Quad PF,6+ Mos 05/21/2016, 06/21/2019, 10/30/2020  . Moderna Sars-Covid-2 Vaccination 11/25/2019, 12/23/2019, 07/12/2020  . Pneumococcal Conjugate-13 01/16/2021  . Pneumococcal Polysaccharide-23 05/21/2016  .  Tdap 11/26/2016, 02/15/2019  . Zoster Recombinat (Shingrix) 03/06/2021    . I reviewed prior external note(s) from ED visit, hospital stay, Dr. Joya Gaskins. . I reviewed the result(s) of the labs and imaging as noted above.  . I have ordered CT Chest  Discussion of management or test interpretation with another colleague.  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 Lungs sound fairly clear. Concern for ILD or  covid fibrosis given his symptoms. Will proceed with CT scan of the chest. Ordered today.  We will get this scheduled for him. I will contact him with results and next follow up.  Continue warfarin   We discussed disease management and progression at length today.    Return to Care: No follow-ups on file.  Lenice Llamas, MD Pulmonary and [place]  CC: Elsie Stain, MD

## 2021-03-12 ENCOUNTER — Other Ambulatory Visit: Payer: Medicare Other

## 2021-03-14 ENCOUNTER — Other Ambulatory Visit: Payer: Self-pay | Admitting: Internal Medicine

## 2021-03-14 ENCOUNTER — Ambulatory Visit (INDEPENDENT_AMBULATORY_CARE_PROVIDER_SITE_OTHER)
Admission: RE | Admit: 2021-03-14 | Discharge: 2021-03-14 | Disposition: A | Discharge status: Home / Self Care | Payer: Medicare Other | Source: Ambulatory Visit | Attending: Internal Medicine | Admitting: Internal Medicine

## 2021-03-14 ENCOUNTER — Other Ambulatory Visit: Payer: Self-pay

## 2021-03-14 DIAGNOSIS — R0609 Other forms of dyspnea: Secondary | ICD-10-CM

## 2021-03-14 DIAGNOSIS — I2510 Atherosclerotic heart disease of native coronary artery without angina pectoris: Secondary | ICD-10-CM | POA: Diagnosis not present

## 2021-03-14 DIAGNOSIS — J849 Interstitial pulmonary disease, unspecified: Secondary | ICD-10-CM

## 2021-03-14 DIAGNOSIS — R0600 Dyspnea, unspecified: Secondary | ICD-10-CM | POA: Diagnosis not present

## 2021-03-14 DIAGNOSIS — J479 Bronchiectasis, uncomplicated: Secondary | ICD-10-CM | POA: Diagnosis not present

## 2021-03-14 DIAGNOSIS — J8410 Pulmonary fibrosis, unspecified: Secondary | ICD-10-CM | POA: Diagnosis not present

## 2021-03-16 DIAGNOSIS — M5412 Radiculopathy, cervical region: Secondary | ICD-10-CM | POA: Diagnosis not present

## 2021-03-19 ENCOUNTER — Other Ambulatory Visit (HOSPITAL_COMMUNITY): Payer: Self-pay | Admitting: Unknown Physician Specialty

## 2021-03-19 ENCOUNTER — Other Ambulatory Visit: Payer: Self-pay

## 2021-03-19 MED ORDER — SPIRONOLACTONE 25 MG PO TABS: ORAL_TABLET | ORAL | 11 refills | Status: DC

## 2021-03-20 ENCOUNTER — Telehealth: Payer: Self-pay | Admitting: Internal Medicine

## 2021-03-20 NOTE — Telephone Encounter (Signed)
Atc patient unable to reach left message to call back   Placed recall for 3 months with Dr. Shearon Stalls

## 2021-03-20 NOTE — Telephone Encounter (Signed)
CT Chest does not show any evidence of scarring in the lungs. Much less likely that his symptoms are related to pulmonary issue. However, I'd like to see him in follow up in 3 months to see how his symptoms are doing.

## 2021-03-21 NOTE — Telephone Encounter (Signed)
Called and spoke with the pt and notified of results/recs per Dr Shearon Stalls. Recall was already placed. Nothing further needed

## 2021-03-27 ENCOUNTER — Ambulatory Visit (INDEPENDENT_AMBULATORY_CARE_PROVIDER_SITE_OTHER): Payer: Medicare Other

## 2021-03-27 DIAGNOSIS — I428 Other cardiomyopathies: Secondary | ICD-10-CM | POA: Diagnosis not present

## 2021-03-27 LAB — CUP PACEART REMOTE DEVICE CHECK
Battery Remaining Longevity: 74 mo
Battery Remaining Percentage: 73 %
Battery Voltage: 2.98 V
Brady Statistic RV Percent Paced: 1 %
Date Time Interrogation Session: 20220628020020
HighPow Impedance: 66 Ohm
HighPow Impedance: 66 Ohm
Implantable Lead Implant Date: 20190918
Implantable Lead Location: 753860
Implantable Lead Model: 7122
Implantable Pulse Generator Implant Date: 20190918
Lead Channel Impedance Value: 460 Ohm
Lead Channel Pacing Threshold Amplitude: 0.5 V
Lead Channel Pacing Threshold Pulse Width: 0.5 ms
Lead Channel Sensing Intrinsic Amplitude: 12 mV
Lead Channel Setting Pacing Amplitude: 2.5 V
Lead Channel Setting Pacing Pulse Width: 0.5 ms
Lead Channel Setting Sensing Sensitivity: 0.5 mV
Pulse Gen Serial Number: 9796641

## 2021-03-28 ENCOUNTER — Encounter: Payer: Self-pay | Admitting: Podiatry

## 2021-03-28 ENCOUNTER — Other Ambulatory Visit: Payer: Self-pay

## 2021-03-28 ENCOUNTER — Ambulatory Visit (INDEPENDENT_AMBULATORY_CARE_PROVIDER_SITE_OTHER): Payer: Medicare Other | Admitting: Podiatry

## 2021-03-28 DIAGNOSIS — M79674 Pain in right toe(s): Secondary | ICD-10-CM

## 2021-03-28 DIAGNOSIS — E119 Type 2 diabetes mellitus without complications: Secondary | ICD-10-CM | POA: Diagnosis not present

## 2021-03-28 DIAGNOSIS — B351 Tinea unguium: Secondary | ICD-10-CM

## 2021-03-28 DIAGNOSIS — D689 Coagulation defect, unspecified: Secondary | ICD-10-CM

## 2021-03-28 DIAGNOSIS — M79675 Pain in left toe(s): Secondary | ICD-10-CM | POA: Diagnosis not present

## 2021-03-28 DIAGNOSIS — I739 Peripheral vascular disease, unspecified: Secondary | ICD-10-CM | POA: Diagnosis not present

## 2021-03-28 NOTE — Progress Notes (Signed)
This patient returns to my office for at risk foot care.  This patient requires this care by a professional since this patient will be at risk due to having coagulation defect, diabetes and peripheral claudication.  Patient is taking coumadin.  This patient is unable to cut nails himself since the patient cannot reach his nails.These nails are painful walking and wearing shoes.  This patient presents for at risk foot care today.  General Appearance  Alert, conversant and in no acute stress.  Vascular  Dorsalis pedis and posterior tibial  pulses are weakly palpable  bilaterally.  Capillary return is within normal limits  bilaterally. Cold feet bilaterally. Absent digital hair.  Neurologic  Senn-Weinstein monofilament wire test within normal limits  bilaterally. Muscle power within normal limits bilaterally.  Nails Thick disfigured discolored nails with subungual debris  from hallux to fifth toes bilaterally. No evidence of bacterial infection or drainage bilaterally.  Orthopedic  No limitations of motion  feet .  No crepitus or effusions noted.  No bony pathology or digital deformities noted.  Skin  normotropic skin with no porokeratosis noted bilaterally.  No signs of infections or ulcers noted.     Onychomycosis  Pain in right toes  Pain in left toes  Consent was obtained for treatment procedures.   Mechanical debridement of nails 1-5  bilaterally performed with a nail nipper.  Filed with dremel without incident.    Return office visit   4 months                   Told patient to return for periodic foot care and evaluation due to potential at risk complications.   Gardiner Barefoot DPM

## 2021-03-29 ENCOUNTER — Other Ambulatory Visit (HOSPITAL_COMMUNITY): Payer: Self-pay

## 2021-03-29 ENCOUNTER — Other Ambulatory Visit: Payer: Self-pay | Admitting: Cardiovascular Disease

## 2021-03-29 NOTE — Telephone Encounter (Signed)
Rx(s) sent to pharmacy electronically.  

## 2021-04-01 ENCOUNTER — Emergency Department (HOSPITAL_COMMUNITY)
Admission: EM | Admit: 2021-04-01 | Discharge: 2021-04-01 | Disposition: A | Discharge status: Home / Self Care | Payer: Medicare Other | Attending: Emergency Medicine | Admitting: Emergency Medicine

## 2021-04-01 ENCOUNTER — Emergency Department (HOSPITAL_COMMUNITY): Payer: Medicare Other

## 2021-04-01 ENCOUNTER — Other Ambulatory Visit: Payer: Self-pay

## 2021-04-01 ENCOUNTER — Other Ambulatory Visit: Payer: Self-pay | Admitting: Internal Medicine

## 2021-04-01 DIAGNOSIS — I5042 Chronic combined systolic (congestive) and diastolic (congestive) heart failure: Secondary | ICD-10-CM | POA: Diagnosis not present

## 2021-04-01 DIAGNOSIS — Z87891 Personal history of nicotine dependence: Secondary | ICD-10-CM | POA: Diagnosis not present

## 2021-04-01 DIAGNOSIS — Z91010 Allergy to peanuts: Secondary | ICD-10-CM | POA: Insufficient documentation

## 2021-04-01 DIAGNOSIS — Z7901 Long term (current) use of anticoagulants: Secondary | ICD-10-CM | POA: Insufficient documentation

## 2021-04-01 DIAGNOSIS — I110 Hypertensive heart disease with heart failure: Secondary | ICD-10-CM | POA: Diagnosis not present

## 2021-04-01 DIAGNOSIS — R000 Tachycardia, unspecified: Secondary | ICD-10-CM | POA: Diagnosis not present

## 2021-04-01 DIAGNOSIS — R0602 Shortness of breath: Secondary | ICD-10-CM | POA: Diagnosis not present

## 2021-04-01 DIAGNOSIS — Z7984 Long term (current) use of oral hypoglycemic drugs: Secondary | ICD-10-CM | POA: Insufficient documentation

## 2021-04-01 DIAGNOSIS — R61 Generalized hyperhidrosis: Secondary | ICD-10-CM | POA: Insufficient documentation

## 2021-04-01 DIAGNOSIS — Z79899 Other long term (current) drug therapy: Secondary | ICD-10-CM | POA: Diagnosis not present

## 2021-04-01 DIAGNOSIS — I517 Cardiomegaly: Secondary | ICD-10-CM | POA: Diagnosis not present

## 2021-04-01 DIAGNOSIS — R42 Dizziness and giddiness: Secondary | ICD-10-CM | POA: Diagnosis present

## 2021-04-01 DIAGNOSIS — I959 Hypotension, unspecified: Secondary | ICD-10-CM | POA: Diagnosis not present

## 2021-04-01 DIAGNOSIS — J811 Chronic pulmonary edema: Secondary | ICD-10-CM | POA: Diagnosis not present

## 2021-04-01 LAB — CBC WITH DIFFERENTIAL/PLATELET
Abs Immature Granulocytes: 0.03 10*3/uL (ref 0.00–0.07)
Basophils Absolute: 0 10*3/uL (ref 0.0–0.1)
Basophils Relative: 1 %
Eosinophils Absolute: 0.2 10*3/uL (ref 0.0–0.5)
Eosinophils Relative: 3 %
HCT: 45.3 % (ref 39.0–52.0)
Hemoglobin: 13.8 g/dL (ref 13.0–17.0)
Immature Granulocytes: 1 %
Lymphocytes Relative: 33 %
Lymphs Abs: 2.1 10*3/uL (ref 0.7–4.0)
MCH: 24.6 pg — ABNORMAL LOW (ref 26.0–34.0)
MCHC: 30.5 g/dL (ref 30.0–36.0)
MCV: 80.7 fL (ref 80.0–100.0)
Monocytes Absolute: 0.5 10*3/uL (ref 0.1–1.0)
Monocytes Relative: 7 %
Neutro Abs: 3.6 10*3/uL (ref 1.7–7.7)
Neutrophils Relative %: 55 %
Platelets: 268 10*3/uL (ref 150–400)
RBC: 5.61 MIL/uL (ref 4.22–5.81)
RDW: 17.9 % — ABNORMAL HIGH (ref 11.5–15.5)
WBC: 6.4 10*3/uL (ref 4.0–10.5)
nRBC: 0 % (ref 0.0–0.2)

## 2021-04-01 LAB — COMPREHENSIVE METABOLIC PANEL
ALT: 20 U/L (ref 0–44)
AST: 35 U/L (ref 15–41)
Albumin: 3.6 g/dL (ref 3.5–5.0)
Alkaline Phosphatase: 74 U/L (ref 38–126)
Anion gap: 8 (ref 5–15)
BUN: 27 mg/dL — ABNORMAL HIGH (ref 8–23)
CO2: 19 mmol/L — ABNORMAL LOW (ref 22–32)
Calcium: 9.3 mg/dL (ref 8.9–10.3)
Chloride: 107 mmol/L (ref 98–111)
Creatinine, Ser: 1.77 mg/dL — ABNORMAL HIGH (ref 0.61–1.24)
GFR, Estimated: 42 mL/min — ABNORMAL LOW (ref >60)
Glucose, Bld: 182 mg/dL — ABNORMAL HIGH (ref 70–99)
Potassium: 4.8 mmol/L (ref 3.5–5.1)
Sodium: 134 mmol/L — ABNORMAL LOW (ref 135–145)
Total Bilirubin: 1 mg/dL (ref 0.3–1.2)
Total Protein: 6.9 g/dL (ref 6.5–8.1)

## 2021-04-01 LAB — I-STAT CHEM 8, ED
BUN: 41 mg/dL — ABNORMAL HIGH (ref 8–23)
Calcium, Ion: 1.17 mmol/L (ref 1.15–1.40)
Chloride: 108 mmol/L (ref 98–111)
Creatinine, Ser: 1.7 mg/dL — ABNORMAL HIGH (ref 0.61–1.24)
Glucose, Bld: 177 mg/dL — ABNORMAL HIGH (ref 70–99)
HCT: 47 % (ref 39.0–52.0)
Hemoglobin: 16 g/dL (ref 13.0–17.0)
Potassium: 5 mmol/L (ref 3.5–5.1)
Sodium: 137 mmol/L (ref 135–145)
TCO2: 21 mmol/L — ABNORMAL LOW (ref 22–32)

## 2021-04-01 LAB — LACTIC ACID, PLASMA
Lactic Acid, Venous: 2.6 mmol/L (ref 0.5–1.9)
Lactic Acid, Venous: 2.5 mmol/L (ref 0.5–1.9)

## 2021-04-01 LAB — PROTIME-INR
INR: 2.7 — ABNORMAL HIGH (ref 0.8–1.2)
Prothrombin Time: 28.4 seconds — ABNORMAL HIGH (ref 11.4–15.2)

## 2021-04-01 LAB — TROPONIN I (HIGH SENSITIVITY)
Troponin I (High Sensitivity): 10 ng/L (ref <18)
Troponin I (High Sensitivity): 9 ng/L (ref <18)

## 2021-04-01 LAB — BRAIN NATRIURETIC PEPTIDE: B Natriuretic Peptide: 11.8 pg/mL (ref 0.0–100.0)

## 2021-04-01 MED ORDER — LACTATED RINGERS IV BOLUS
1000.0000 mL | Freq: Once | INTRAVENOUS | Status: AC
  Administered 2021-04-01: 1000 mL via INTRAVENOUS

## 2021-04-01 NOTE — Telephone Encounter (Signed)
dc'd 02/27/21 Dr Joya Gaskins

## 2021-04-01 NOTE — Discharge Instructions (Addendum)
Hold your blood pressure medications until you have a chance to talk to your doctor later this week. Return to the ER if you continue to feel poorly or have any more drops in your blood pressure at home.

## 2021-04-01 NOTE — ED Triage Notes (Signed)
Patient complains of weakness since yesterday. States that he took his VS yesterday and BP 70s. This am took his BP meds and on arrival severe fatigue with diaphoresis. Patient denies pain

## 2021-04-01 NOTE — ED Notes (Signed)
Pt was able to stand and walk with no complaints of dizziness or lightheadedness and states that he feels "100%" normal.

## 2021-04-01 NOTE — ED Notes (Signed)
Pt reports he checked his BP this morning and SBP 80's and he took his BP meds afterwards. Pt reporting that his shob and labored breathing is baseline for him ever since covid in 2020.

## 2021-04-01 NOTE — ED Provider Notes (Signed)
[facility] Provider Note  CSN: [id number] Arrival date & time: 04/01/21 0915    History No chief complaint on file.   Jonathan Dickson is a 65 y.o. 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 He felt like he was about to pass out so he pulled over to rest. He was eventually able to get home but continued to feel dizzy and weak through the evening and overnight. He woke up today with a slight headache. He checked his BP at home this morning and it was low, however he then went ahead and took all of his usual morning medications. He has not had any fevers. No change in chronic SOB. He denies any chest pain, nausea, vomiting, abdominal pain, diarrhea, melena, hematochezia or trouble urinating. He has not had any falls.    Past Medical History:  Diagnosis Date   Abnormal TSH 02/2016   Anxiety    Cervical disc disease    Chronic combined systolic and diastolic CHF (congestive heart failure) ([place])    a. 01/2016 Echo: EF 25%, inf AK, diffuse sev HK, Gr1 DD, mild MR.   Depression    History of hiatal hernia    Hyperglycemia    Hypertension    Hypertensive heart disease    Myocardial infarction [facility])    ??  maybe   NICM (nonischemic cardiomyopathy) ([place])    a. 01/2016 Echo: EF 25%, inf AK, diffuse sev HK, Gr1 DD, mild MR; b. 01/2016 MV: EF 22%, no isch/infarct;  c. 02/2016 Cath: Nl cors, EF 35-45%.   Pneumonia    Pre-diabetes    Seizures ([place])    with penicillin    Past Surgical History:  Procedure Laterality Date   ANTERIOR CERVICAL DECOMP/DISCECTOMY FUSION N/A 07/10/2016   Procedure: ANTERIOR CERVICAL DECOMPRESSION FUSION CERVICAL 4-5, CERVICAL 5-6, CERVICAL 6-7 WITH INSTRUMENTATION AND ALLOGRAFT;  Surgeon: [name], MD;  Location: [place];  Service: Orthopedics;  Laterality: N/A;   CARDIAC CATHETERIZATION N/A 03/11/2016   Procedure:  Left Heart Cath and Coronary Angiography;  Surgeon: [name], MD;  Location: [place] [facility];  Service: Cardiovascular;  Laterality: N/A;   ICD IMPLANT N/A 06/17/2018   Procedure: ICD IMPLANT;  Surgeon: [name], MD;  Location: [place] [facility];  Service: Cardiovascular;  Laterality: N/A;   Thumb surgery Right    VENTRAL HERNIA REPAIR N/A 10/25/2019   Procedure: PRIMARY VENTRAL HERNIA REPAIR;  Surgeon: [name], MD;  Location: [facility];  Service: General;  Laterality: N/A;    Family History  Problem Relation Age of Onset   Diabetes Mother    Diabetes Sister    Clotting disorder Neg Hx    Lung disease Neg Hx     Social History   Tobacco Use   Smoking status: Former    Packs/day: 1.50    Years: 18.00    Pack years: 27.00    Types: Cigarettes    Quit date: 03/15/2003    Years since quitting: 18.0   Smokeless tobacco: Never  Vaping Use   Vaping Use: Never used  Substance Use Topics   Alcohol use: No    Comment: none since 2004   Drug use: No    Comment: former  none since 2004     Home Medications Prior to Admission medications   Medication Sig Start Date End  Date Taking? Authorizing Provider  acetaminophen (TYLENOL) 500 MG tablet Take 1,000 mg by mouth every 6 (six) hours as needed for moderate pain or headache.   Yes [provider]  amitriptyline (ELAVIL) 25 MG tablet TAKE 1 TABLET(25 MG) BY MOUTH AT BEDTIME AS NEEDED FOR SLEEP Patient taking differently: Take 25 mg by mouth at bedtime as needed for sleep. TAKE 1 TABLET(25 MG) BY MOUTH AT BEDTIME AS NEEDED FOR SLEEP 06/15/20  Yes Ladell Pier, MD  Aromatic Inhalants (VICKS VAPOINHALER) INHA Inhale 1 Dose into the lungs daily as needed (congestion).   Yes [provider]  atorvastatin (LIPITOR) 80 MG tablet TAKE 1 TABLET BY MOUTH EVERY DAY**SCHEDULE OFFICE VISIT Patient taking differently: Take 80 mg by mouth daily. 01/08/21  Yes Lorretta Harp, MD  carvedilol (COREG) 25  MG tablet TAKE 1 TABLET(25 MG) BY MOUTH TWICE DAILY WITH A MEAL Patient taking differently: Take 25 mg by mouth 2 (two) times daily with a meal. 10/25/20  Yes Lorretta Harp, MD  Chlorphen-Phenyleph-ASA (ALKA-SELTZER PLUS COLD PO) Take 1-2 tablets by mouth 2 (two) times daily as needed (cold symptoms).    Yes [provider]  Cholecalciferol (VITAMIN D) 50 MCG (2000 UT) tablet Take 2,000 Units by mouth daily.   Yes [provider]  dapagliflozin propanediol (FARXIGA) 10 MG TABS tablet Take 1 tablet (10 mg total) by mouth daily before breakfast. Needs appt for further refills 02/01/21  Yes Bensimhon, Shaune Pascal, MD  diclofenac Sodium (VOLTAREN) 1 % GEL Apply 2 g topically 4 (four) times daily. Patient taking differently: Apply 2 g topically 4 (four) times daily as needed (pain). 02/04/20  Yes Ladell Pier, MD  ENTRESTO 97-103 MG TAKE 1 TABLET BY MOUTH TWICE DAILY 06/01/20  Yes Bensimhon, Shaune Pascal, MD  furosemide (LASIX) 40 MG tablet Take 1 tablet (40 mg total) by mouth daily as needed for fluid (if weight gain more than 3 punds). 05/14/18 04/01/21 Yes Ghimire, Henreitta Leber, MD  gabapentin (NEURONTIN) 300 MG capsule Take 300 mg by mouth daily as needed (pain). 08/26/20  Yes [provider]  hydrALAZINE (APRESOLINE) 25 MG tablet Take 1 tablet (25 mg total) by mouth 3 (three) times daily. 07/21/20 04/01/21 Yes Almyra Deforest, PA  isosorbide mononitrate (IMDUR) 30 MG 24 hr tablet TAKE 1 TABLET(30 MG) BY MOUTH AT BEDTIME Patient taking differently: Take 30 mg by mouth at bedtime. 02/09/21  Yes Bensimhon, Shaune Pascal, MD  metFORMIN (GLUCOPHAGE) 500 MG tablet TAKE 1 TABLET BY MOUTH DAILY WITH BREAKFAST Patient taking differently: Take 500 mg by mouth daily with breakfast. 11/02/20  Yes Ladell Pier, MD  spironolactone (ALDACTONE) 25 MG tablet TAKE 1/2 TABLET(12.5 MG) BY MOUTH DAILY Patient taking differently: Take 12.5 mg by mouth daily. 03/19/21  Yes Bensimhon, Shaune Pascal, MD  Tafamidis 61 MG  CAPS TAKE 1 CAPSULE (61 MG) BY MOUTH DAILY. PLEASE DISCONTINUE VYNDAQEL. Patient taking differently: Take 61 mg by mouth daily. 10/04/20 10/04/21 Yes Bensimhon, Shaune Pascal, MD  warfarin (COUMADIN) 5 MG tablet TAKE 1 TO 2 TABLETS BY MOUTH DAILY AS DIRECTED Patient taking differently: Take 7.5 mg by mouth daily at 4 PM. Taking 1 & 1/2 tablets = 7.5mg  daily 12/11/20  Yes Lorretta Harp, MD  hydrALAZINE (APRESOLINE) 50 MG tablet TAKE 1 TABLET(50 MG) BY MOUTH THREE TIMES DAILY Patient not taking: No sig reported 03/29/21   Lorretta Harp, MD     Allergies    Peanut-containing drug products, Penicillins, and Decadron [dexamethasone]  Review of Systems   Review of Systems A comprehensive review of systems was completed and negative except as noted in HPI.    Physical Exam BP 117/73 (BP Location: Right Arm)   Pulse 80   Temp 97.7 F (36.5 C) (Oral)   Resp 18   Ht 5\' 4"  (1.626 m)   Wt 78.9 kg   SpO2 99%   BMI 29.87 kg/m   Physical Exam Vitals and nursing note reviewed.  Constitutional:      Appearance: Normal appearance. He is diaphoretic.  HENT:     Head: Normocephalic and atraumatic.     Nose: Nose normal.     Mouth/Throat:     Mouth: Mucous membranes are moist.  Eyes:     Extraocular Movements: Extraocular movements intact.     Conjunctiva/sclera: Conjunctivae normal.  Cardiovascular:     Rate and Rhythm: Regular rhythm. Tachycardia present.  Pulmonary:     Effort: Pulmonary effort is normal.     Breath sounds: Normal breath sounds.  Abdominal:     General: Abdomen is flat.     Palpations: Abdomen is soft.     Tenderness: There is no abdominal tenderness.  Musculoskeletal:        General: No swelling. Normal range of motion.     Cervical back: Neck supple.  Skin:    General: Skin is warm.  Neurological:     General: No focal deficit present.     Mental Status: He is alert.  Psychiatric:        Mood and Affect: Mood normal.     ED Results / Procedures /  Treatments   Labs (all labs ordered are listed, but only abnormal results are displayed) Labs Reviewed  COMPREHENSIVE METABOLIC PANEL - Abnormal; Notable for the following components:      Result Value   Sodium 134 (*)    CO2 19 (*)    Glucose, Bld 182 (*)    BUN 27 (*)    Creatinine, Ser 1.77 (*)    GFR, Estimated 42 (*)    All other components within normal limits  CBC WITH DIFFERENTIAL/PLATELET - Abnormal; Notable for the following components:   MCH 24.6 (*)    RDW 17.9 (*)    All other components within normal limits  LACTIC ACID, PLASMA - Abnormal; Notable for the following components:   Lactic Acid, Venous 2.6 (*)    All other components within normal limits  LACTIC ACID, PLASMA - Abnormal; Notable for the following components:   Lactic Acid, Venous 2.5 (*)    All other components within normal limits  PROTIME-INR - Abnormal; Notable for the following components:   Prothrombin Time 28.4 (*)    INR 2.7 (*)    All other components within normal limits  I-STAT CHEM 8, ED - Abnormal; Notable for the following components:   BUN 41 (*)    Creatinine, Ser 1.70 (*)    Glucose, Bld 177 (*)    TCO2 21 (*)    All other components within normal limits  BRAIN NATRIURETIC PEPTIDE  TROPONIN I (HIGH SENSITIVITY)  TROPONIN I (HIGH SENSITIVITY)    EKG EKG Interpretation  Date/Time:  Sunday April 01 2021 09:49:06 EDT Ventricular Rate:  109 PR Interval:  152 QRS Duration: 98 QT Interval:  332 QTC Calculation: 447 R Axis:   36 Text Interpretation: Sinus tachycardia Otherwise normal ECG Since last tracing Rate faster Otherwise no significant change Confirmed by Calvert Cantor [id number]) on 04/01/2021 10:12:50 AM  Radiology DG Chest Port 1 View  Result Date: 04/01/2021 CLINICAL DATA:  Short of breath, diaphoretic and hypotensive EXAM: PORTABLE CHEST 1 VIEW COMPARISON:  Prior chest x-ray 04/17/2020 FINDINGS: Left subclavian approach cardiac rhythm maintenance device. The ICD lead  projects over the right ventricle in unchanged position. Stable mild cardiomegaly. Pulmonary vascular congestion is present without overt edema. Chronic atelectasis versus scarring in the lingula without interval change. No new airspace opacity, pleural effusion or pneumothorax. Incompletely imaged anterior cervical stabilization hardware. No acute osseous abnormality. IMPRESSION: Stable chest x-ray without evidence of acute cardiopulmonary process. Electronically Signed   By: Jacqulynn Cadet M.D.   On: 04/01/2021 10:38    Procedures Procedures  Medications Ordered in the ED Medications  lactated ringers bolus 1,000 mL (0 mLs Intravenous Stopped 04/01/21 1133)  lactated ringers bolus 1,000 mL (1,000 mLs Intravenous New Bag/Given 04/01/21 1159)     MDM Rules/Calculators/A&P MDM  Patient noted to be diaphoretic and hypotensive on arrival. He is not febrile, does not have any infectious symptoms. He has not had any bleeding that he has seen but he is on coumadin, so hemorrhage is a possibility. Cardiogenic is also in the differential but not having any chest pain and EKG is not ischemic. Will give LR bolus while checking labs. Continue to monitor.  ED Course  I have reviewed the triage vital signs and the nursing notes.  Pertinent labs & imaging results that were available during my care of the patient were reviewed by me and considered in my medical decision making (see chart for details).  Clinical Course as of 04/01/21 1443  Sun Apr 01, 2021  1103 INR is therapeutic.  [CS]  1112 I-Stat does not show anemia, so bleeding unlikely cause.  [CS]  6803 CBC is normal, no leukocytosis to suggest sepsis. CMP with mild AKI otherwise unremarkable. Patient is feeling better, HR has improved, BP still borderline low. Will give additional LR bolus and reassess.  [CS]  1328 Lactic acid not significant changed but BP is much improved. Trop is neg x 2 [CS]  2122 Patient states he is feeling much better, back  to baseline and would like to go home. Will have tech do an ambulatory trial and if he tolerates well, plan discharge. He was advised not to take his home BP meds for the next 2 days and discuss dose adjustments with PCP on Tuesday. Continue to rest and drink plenty of fluids. RTED for any worsening or other concerns.  [CS]    Clinical Course User Index [CS] Truddie Hidden, MD    Final Clinical Impression(s) / ED Diagnoses Final diagnoses:  Transient hypotension    Rx / DC Orders ED Discharge Orders     None        Truddie Hidden, MD 04/01/21 1443

## 2021-04-03 ENCOUNTER — Other Ambulatory Visit (HOSPITAL_COMMUNITY): Payer: Self-pay

## 2021-04-03 ENCOUNTER — Telehealth: Payer: Self-pay | Admitting: Internal Medicine

## 2021-04-03 DIAGNOSIS — N289 Disorder of kidney and ureter, unspecified: Secondary | ICD-10-CM

## 2021-04-03 MED FILL — Tafamidis Cap 61 MG: ORAL | 30 days supply | Qty: 30 | Fill #3 | Status: AC

## 2021-04-03 NOTE — Telephone Encounter (Signed)
Phone call placed to patient this evening.  Patient seen in the emergency room 04/01/2021 with dizziness that started the day before.  Patient found to be hypotensive and dehydrated.  Creatinine was 1.77 increased from when it was last checked in October 2021 when it was 1.37. I note that patient was on hydralazine 25 mg 3 times a day.  However prescription was refilled on 03/29/2021 by Dr. [name] for 50 mg TID.  Pt states he did not noticed the dose increased on the bottle until the 2 nd day of taking the 50 mg which was the day he started feeling dizzy.  He has not taken the Hydralazine since he was in the ER on 04/01/2021.  He continues to take his other medications including carvedilol, Entresto and spironolactone.  He takes furosemide as needed and has not taken it in a while.  He has a blood pressure device at home and has been checking his blood pressure.  Blood pressure today was 112/65.  He is scheduled to have surgery on his neck on the 21st of this month. Advised patient to continue to hold the hydralazine for now. He will come to the lab later this week to have repeat chemistry to check kidney function.  He will need to see Dr. [name] prior to his surgery.  Message sent to Dr. [name].

## 2021-04-03 NOTE — Telephone Encounter (Signed)
Pt is calling to report on Saturday his bp was 70/40, Sunday- 80/58 Pt went to the hospital after has slight headache and sweating. Pt was told that he was dehydydrated. Pt would like to know does hydrALAZINE (APRESOLINE) 50 MG tablet [[id number]] need to be adjusted?  Pt was told by ED doctor to stop taking the medication until after he has spoken to Dr. Wynetta Emery   Please advise Cb- [phone]

## 2021-04-04 ENCOUNTER — Telehealth: Payer: Self-pay | Admitting: Internal Medicine

## 2021-04-04 ENCOUNTER — Ambulatory Visit: Payer: Medicare Other | Attending: Internal Medicine

## 2021-04-04 ENCOUNTER — Other Ambulatory Visit: Payer: Self-pay

## 2021-04-04 DIAGNOSIS — N289 Disorder of kidney and ureter, unspecified: Secondary | ICD-10-CM

## 2021-04-04 NOTE — Telephone Encounter (Signed)
Phone call placed to patient this morning.  Patient advised that I did hear back from Dr. [name].  He recommended that the patient go back on hydralazine 25 mg 3 times a day.  He will try to get the patient in prior to his surgery but reports he will be out of the office next week.  If he is unable to see him he will have one of his advanced practitioners see him.  Patient reports his blood pressure this morning was 121/75.  Patient is nervous about getting back on hydralazine.  I told him in that case he can hold off until he sees the cardiologist provided that his blood pressure remains at goal.

## 2021-04-05 ENCOUNTER — Telehealth: Payer: Self-pay | Admitting: *Deleted

## 2021-04-05 LAB — BASIC METABOLIC PANEL
BUN/Creatinine Ratio: 17 (ref 10–24)
BUN: 21 mg/dL (ref 8–27)
CO2: 16 mmol/L — ABNORMAL LOW (ref 20–29)
Calcium: 9.5 mg/dL (ref 8.6–10.2)
Chloride: 107 mmol/L — ABNORMAL HIGH (ref 96–106)
Creatinine, Ser: 1.21 mg/dL (ref 0.76–1.27)
Glucose: 111 mg/dL — ABNORMAL HIGH (ref 65–99)
Potassium: 4.3 mmol/L (ref 3.5–5.2)
Sodium: 139 mmol/L (ref 134–144)
eGFR: 66 mL/min/{1.73_m2} (ref >59)

## 2021-04-05 NOTE — Telephone Encounter (Signed)
-----   Message from Lorretta Harp, MD sent at 04/04/2021  6:17 AM EDT ----- You can decrease his hydralazine dose to 25 mg TID. I'm out of the office next week so I'm not sure I'll be able to get him in to see me by the time of his surgery but I'll have my nurse try and if not me an APP.  [place] Plan ----- Message ----- From: Ladell Pier, MD Sent: 04/03/2021  10:00 PM EDT To: Lorretta Harp, MD  Hi Dr. [name]:  I am the PCP for Mr. Osment.  He was seen in ER 04/01/2021 with dizziness, low BP and dehydration.  He is on carvedilol, hydralazine, spironolactone and Entresto.  He was previously on hydralazine 25 mg 3 times a day.  Looks like you refilled his hydralazine on 03/29/2021 for dose of 50 mg TID.  I am not sure whether you intended to increase the dose or not.  I told him to hold the hydralazine for now.  He has been checking his blood pressure at home since the ER visit and he is running in the low normal range.  He is scheduled to have surgery on his neck on the 21st of this month and would like to see you before then.  I told him I will reach out to you to let you know.  Thanks.

## 2021-04-05 NOTE — Telephone Encounter (Signed)
Spoke with pt, he has not taken any hydralazine for about a week now and his bp is running 122/64 and  112/60. He is aware he will need to contact the surgeon to fax [place] clearance as there is nothing related to that in his chart. Follow up scheduled and he will bring his list of bp and his cuff to that appointment.

## 2021-04-05 NOTE — Progress Notes (Signed)
Cardiology Office Note:    Date:  04/09/2021   ID:  Jonathan Dickson, DOB 1955-12-13, MRN [id number]  PCP:  Ladell Pier, MD  Cardiologist:  Quay Burow, MD  Electrophysiologist:  [name], MD   Referring MD: Ladell Pier, MD   Chief Complaint: pre-op evaluation and follow-up of low BP  History of Present Illness:    Jonathan Dickson is a 65 y.o. 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 [name] and Dr. Haroldine Laws who presents today for pre-op evaluation for upcoming cervical neck procedure.  Patient has a history of non-ischemic cardiomyopathy with EF as low as 25% in 2017. Cardiac catheterization at that time showed normal coronaries. He was started on GDMT with mild improvement to 35-40% on Echo in 2019. Cardiac MRI and PYP scan in 2019 were strongly suggestive of TTR amyloidosis. He was started on Tafamadis. Patient underwent ICD placement in 05/2018 with Dr. Curt Bears for primary prevention. Patient also has a history of bilateral PE in 2019. He was placed on Eliquis but then developed an acute left lower extremity DVT in 2020 while on Eliquis so was switched to Coumadin. Last Echo in 01/2020 showed LVEF of 35-40% with global hypokinesis and normal diastolic function. RV normal. Patient has not been seen by Dr. Haroldine Laws since 01/2020. He was seen by Almyra Deforest, PA-C, in 06/2020 for syncope in the setting of hypotension. Hydralazine was decreased. At follow-up visit in 07/2020, he was doing much better without any recurrent dizziness of syncope. He also denied any chest pain or shortness of breath.   On 03/31/2021, patient stated he was driving when he  started to feel dizzy and vision went white. He felt like he was going to pass out but denies any syncope. When he got home, he checked his BP and systolic BP was in the 40X. Home Hydralazine had recently been refilled and new prescription had him taken a high dose (50mg  three times daily rather than 25mg ). Unclear whether this was an intentional change or not. He drank some fluids. The next day his BP was still low and he had a slightly headache so he went to the ED. Work-up in the ED remarkable for mild AKI with creatinine of 1.7 (last baseline 1.3) and elevated lactic acid of 2.6. WBC normal. High-sensitivity troponin negative x2. EKG unchanged. Felt to be secondary to low BP. He was given 2 L of IV fluids and felt much better. Creatinine improved to baseline on repeat check on 04/04/2021. PCP instructed patient to hold Hydralazine but resumed other CHF medications.   Patient presents to the office today for pre-op evaluation and follow-up of low BP. Since IV fluids in the ED and holding Hydralazine, patient has had no recurrent symptoms. No chest pain or new or worsening shortness of breath. He states he has some trouble breathing a night since COVID but this is stable. He is able to lay on a flat surface. He sometimes wakes up feeling short of breath but Vicks inhaler helps with this. No lower extremity edema or weight gain. No palpitations or recurrent lightheadedness, dizziness, near syncope, syncope. No abnormal bleeding on Coumadin. Patient has good functional status and is able to complete >4.0 METS.  Past Medical  History:  Diagnosis Date   Abnormal TSH 02/2016   Anxiety    Cervical disc disease    Chronic combined systolic and diastolic CHF (congestive heart failure) ([place])    a. 01/2016 Echo: EF 25%, inf AK, diffuse sev HK, Gr1 DD, mild MR.   Depression    History of hiatal hernia    Hyperglycemia    Hypertension    Hypertensive heart disease    Myocardial infarction [facility])    ??  maybe    NICM (nonischemic cardiomyopathy) ([place])    a. 01/2016 Echo: EF 25%, inf AK, diffuse sev HK, Gr1 DD, mild MR; b. 01/2016 MV: EF 22%, no isch/infarct;  c. 02/2016 Cath: Nl cors, EF 35-45%.   Pneumonia    Pre-diabetes    Seizures ([place])    with penicillin    Past Surgical History:  Procedure Laterality Date   ANTERIOR CERVICAL DECOMP/DISCECTOMY FUSION N/A 07/10/2016   Procedure: ANTERIOR CERVICAL DECOMPRESSION FUSION CERVICAL 4-5, CERVICAL 5-6, CERVICAL 6-7 WITH INSTRUMENTATION AND ALLOGRAFT;  Surgeon: [name], MD;  Location: [place] [facility];  Service: Orthopedics;  Laterality: N/A;   CARDIAC CATHETERIZATION N/A 03/11/2016   Procedure: Left Heart Cath and Coronary Angiography;  Surgeon: [name], MD;  Location: [place] [facility];  Service: Cardiovascular;  Laterality: N/A;   ICD IMPLANT N/A 06/17/2018   Procedure: ICD IMPLANT;  Surgeon: [name], MD;  Location: [place] [facility];  Service: Cardiovascular;  Laterality: N/A;   Thumb surgery Right    VENTRAL HERNIA REPAIR N/A 10/25/2019   Procedure: PRIMARY VENTRAL HERNIA REPAIR;  Surgeon: [name], MD;  Location: [facility];  Service: General;  Laterality: N/A;    Current Medications: Current Meds  Medication Sig   acetaminophen (TYLENOL) 500 MG tablet Take 1,000 mg by mouth every 6 (six) hours as needed for moderate pain or headache.   amitriptyline (ELAVIL) 25 MG tablet TAKE 1 TABLET(25 MG) BY MOUTH AT BEDTIME AS NEEDED FOR SLEEP (Patient taking differently: Take 25 mg by mouth at bedtime as needed for sleep. TAKE 1 TABLET(25 MG) BY MOUTH AT BEDTIME AS NEEDED FOR SLEEP)   Aromatic Inhalants (VICKS VAPOINHALER) INHA Inhale 1 Dose into the lungs daily as needed (congestion).   atorvastatin (LIPITOR) 80 MG tablet TAKE 1 TABLET BY MOUTH EVERY DAY**SCHEDULE OFFICE VISIT (Patient taking differently: Take 80 mg by mouth daily.)   carvedilol (COREG) 25 MG tablet TAKE 1 TABLET(25 MG) BY MOUTH TWICE DAILY WITH A MEAL (Patient taking  differently: Take 25 mg by mouth 2 (two) times daily with a meal.)   Chlorphen-Phenyleph-ASA (ALKA-SELTZER PLUS COLD PO) Take 1-2 tablets by mouth 2 (two) times daily as needed (cold symptoms).    Cholecalciferol (VITAMIN D) 50 MCG (2000 UT) tablet Take 2,000 Units by mouth daily.   dapagliflozin propanediol (FARXIGA) 10 MG TABS tablet Take 1 tablet (10 mg total) by mouth daily before breakfast. Needs appt for further refills   diclofenac Sodium (VOLTAREN) 1 % GEL Apply 2 g topically 4 (four) times daily. (Patient taking differently: Apply 2 g topically 4 (four) times daily as needed (pain).)   ENTRESTO 97-103 MG TAKE 1 TABLET BY MOUTH TWICE DAILY   isosorbide mononitrate (IMDUR) 30 MG 24 hr tablet TAKE 1 TABLET(30 MG) BY MOUTH AT BEDTIME (Patient taking differently: Take 30 mg by mouth at bedtime.)   metFORMIN (GLUCOPHAGE) 500 MG tablet TAKE 1 TABLET BY MOUTH DAILY WITH BREAKFAST (Patient taking differently: Take 500 mg by mouth daily with breakfast.)  spironolactone (ALDACTONE) 25 MG tablet TAKE 1/2 TABLET(12.5 MG) BY MOUTH DAILY (Patient taking differently: Take 12.5 mg by mouth daily.)   Tafamidis 61 MG CAPS TAKE 1 CAPSULE (61 MG) BY MOUTH DAILY. PLEASE DISCONTINUE VYNDAQEL. (Patient taking differently: Take 61 mg by mouth daily.)   warfarin (COUMADIN) 5 MG tablet TAKE 1 TO 2 TABLETS BY MOUTH DAILY AS DIRECTED (Patient taking differently: Take 7.5 mg by mouth daily at 4 PM. Taking 1 & 1/2 tablets = 7.5mg  daily)     Allergies:   Peanut-containing drug products, Penicillins, and Decadron [dexamethasone]   Social History   Socioeconomic History   Marital status: Legally Separated    Spouse name: Not on file   Number of children: 3   Years of education: Not on file   Highest education level: Not on file  Occupational History   Not on file  Tobacco Use   Smoking status: Former    Packs/day: 1.50    Years: 18.00    Pack years: 27.00    Types: Cigarettes    Quit date: 03/15/2003    Years  since quitting: 18.0   Smokeless tobacco: Never  Vaping Use   Vaping Use: Never used  Substance and Sexual Activity   Alcohol use: No    Comment: none since 2004   Drug use: No    Comment: former  none since 2004   Sexual activity: Not Currently  Other Topics Concern   Not on file  Social History Narrative   Not on file   Social Determinants of Health   Financial Resource Strain: Not on file  Food Insecurity: Not on file  Transportation Needs: Not on file  Physical Activity: Not on file  Stress: Not on file  Social Connections: Not on file     Family History: The patient's family history includes Diabetes in his mother and sister. There is no history of Clotting disorder or Lung disease.  ROS:   Please see the history of present illness.     EKGs/Labs/Other Studies Reviewed:    The following studies were reviewed today:  Cardiac Catheterization 03/11/2016: There is mild to moderate left ventricular systolic dysfunction. Angiographically normal coronary arteries with a codominant system Mild-moderately elevated LVEDP   Nonischemic Cardiomyopathy Suspect chest pain is related to increased LV filling pressures. Probably still could tolerate some diuresis.   Plan: Return to nursing floor for ongoing care and TR band removal. Continue current management by primary service. _______________  PYP Scan 5/29/20219: Impression: Visual and quantitative assessment (grade 2, H/CLL equal 1.57) are strongly suggestive of transthyretin amyloidosis. _______________  Cardiac MRI 04/01/2018: Impressions: 1. Mild LVE with mild LVH septal thickness 13 mm. Diffuse hypokinesis worse in the septum apex and lateral walls EF 28%   2. Abnormal post gadolinium inversion recovery sequences with failure to null and uptake in the mid/subepicardial septum, inferior wall and lateral walls with some apical sparing   Findings consistent with amyloidosis or infiltrative  cardiomyopathy _______________  Echocardiogram 02/14/2020: Impressions: 1. Diffuse hypokinesis worse in the septum and inferior base EF similar  to TTE done November 2020. Left ventricular ejection fraction, by  estimation, is 35 to 40%. The left ventricle has moderately decreased  function. The left ventricle demonstrates  global hypokinesis. Left ventricular diastolic parameters were normal.   2. Pacing wires in RA/RV. Right ventricular systolic function is normal.  The right ventricular size is normal. There is normal pulmonary artery  systolic pressure.   3. The mitral valve is  normal in structure. Trivial mitral valve  regurgitation. No evidence of mitral stenosis.   4. The aortic valve is normal in structure. Aortic valve regurgitation is  not visualized. No aortic stenosis is present.   5. The inferior vena cava is normal in size with greater than 50%  respiratory variability, suggesting right atrial pressure of 3 mmHg.   EKG:  EKG ordered today. EKG personally reviewed and demonstrates normal sinus rhythm, rate 90 bpm, with isolated T wave inversions in aVL. Normal axis. Normal PR and QRS intervals. QTc 428 ms. No significant changes from prior tracings.  Recent Labs: 07/21/2020: NT-Pro BNP 64 04/01/2021: ALT 20; B Natriuretic Peptide 11.8; Hemoglobin 16.0; Platelets 268 04/04/2021: BUN 21; Creatinine, Ser 1.21; Potassium 4.3; Sodium 139  Recent Lipid Panel    Component Value Date/Time   CHOL 153 05/16/2020 0840   TRIG 175 (H) 05/16/2020 0840   HDL 41 05/16/2020 0840   CHOLHDL 3.7 05/16/2020 0840   CHOLHDL 2.9 10/04/2014 1508   VLDL 17 10/04/2014 1508   LDLCALC 82 05/16/2020 0840    Physical Exam:    Vital Signs: BP 94/60 (BP Location: Right Arm, Patient Position: Sitting, Cuff Size: Normal)   Pulse 90   Ht 5\' 6"  (1.676 m)   Wt 170 lb 6.4 oz (77.3 kg)   SpO2 97%   BMI 27.50 kg/m     Wt Readings from Last 3 Encounters:  04/09/21 170 lb 6.4 oz (77.3 kg)  04/01/21  174 lb (78.9 kg)  03/07/21 171 lb (77.6 kg)     General: 65 y.o. African-American male in no acute distress. HEENT: Normocephalic and atraumatic. Sclera clear.  Neck: Supple. No carotid bruits.  Heart: RRR. Distinct S1 and S2. No murmurs, gallops, or rubs. Radial pulses 2+ and equal bilaterally. Lungs: No increased work of breathing. Clear to ausculation bilaterally. No wheezes, rhonchi, or rales.  Abdomen: Soft, non-distended, and non-tender to palpation.  Extremities: No lower extremity edema.    Skin: Warm and dry. Neuro: Alert and oriented x3. No focal deficits. Psych: Normal affect. Responds appropriately.  Assessment:    1. Pre-op evaluation   2. Non-ischemic cardiomyopathy ([place])   3. Chronic combined systolic and diastolic CHF (congestive heart failure) ([place])   4. Cardiac amyloidosis ([place])   5. S/P ICD (internal cardiac defibrillator) procedure   6. Primary hypertension   7. Type 2 diabetes mellitus with complication, without long-term current use of insulin ([place])   8. History of pulmonary embolus (PE)   9. History of DVT (deep vein thrombosis)     Plan:    Pre-Op Evaluation  - Patient scheduled for anterior cervical decompression/fusion on 04/19/2021.  - Patient doing well from cardiac standpoint. No angina or acute CHF symptoms. No syncope. - Per Revised Cardiac Risk Index, considered low risk with 0.9% chance of adverse cardiac events perioperatively. Patient able to complete >4.0 METS. Therefore, based on ACC/AHA guidelines, patient would be at acceptable risk for the planned procedure without further cardiovascular testing. Per Pharmacy, patient can hold Coumadin for 5 days prior to admission. However, he WILL need bridging with Lovenox around procedure. Our Coumadin Clinic will help arrange this. I will route this recommendation to the requesting party via Epic fax function.   Non-Ischemic Cardiomyopathy Chronic Combined CHF Cardiac Amyloidosis - Initial diagnosed in  2017. EF as low as 20-25%. Cardiac cath at time of initial diagnosis showed normal coronaries. PYP scan and cardiac MRI in 2019 consistent with TTR amyloidosis.  - Most recent  Echo in 01/2020 showed LVEF of 35-40% with global hypokinesis and normal diastolic function. RV normal. No significant valvular disease.  - Appears euvolemic on exam. - Continue Lasix 40mg  daily PRN for weight gain. - Continue Entresto 97-103mg  twice daily. - Continue Coreg 25mg  twice daily. - Continue Spironolactone 12.5mg  daily. - Continue Imdur 30mg  daily. - Continue Farxiga 10mg  daily. - Hydralazine stopped due to hypotension. - Continue Tafamadis for cardiac amyloidosis.  - Discussed importance of daily weights and sodium/fluid restrictions.  S/p ICD - Abbott ICD placed in 05/2018 for primary prevention.  - Followed by Dr. Curt Bears.  Hypertension - History of hypertension but has struggled with hypotension recently. Seen in the ED earlier this month and required IV fluids. BP still soft at home with systolic BP sometimes in the 90s but symptoms resolved since Hydralazine was stopped. BP 94/60 in the office today. Asymptomatic with this. - Would remain off Hydralazine. Otherwise, continue medications for CHF as above.  Type 2 Diabetes Mellitus - Hemoglobin A1c 6.4 in 09/2020 (per [facility]). - Management per PCP.  History of Bilateral PE/DVT - Patient diagnosed with bilateral PE in 2019 and placed on Eliquis. Then diagnosed with acute DVT while on Eliquis so was switched to Coumadin. - Continue lifelong anticoagulation with Coumadin. Followed in our Coumadin Clinic.   Disposition: Follow up in 6 months with Dr. [name].   Medication Adjustments/Labs and Tests Ordered: Current medicines are reviewed at length with the patient today.  Concerns regarding medicines are outlined above.  Orders Placed This Encounter  Procedures   EKG 12-Lead    No orders of the defined types were placed in this encounter.   Patient  Instructions  Medication Instructions:  No Changes *If you need a refill on your cardiac medications before your next appointment, please call your pharmacy*   Lab Work: No Labs If you have labs (blood work) drawn today and your tests are completely normal, you will receive your results only by: [place] (if you have MyChart) OR A paper copy in the mail If you have any lab test that is abnormal or we need to change your treatment, we will call you to review the results.   Testing/Procedures: No Testing   Follow-Up: At [facility], you and your health needs are our priority.  As part of our continuing mission to provide you with exceptional heart care, we have created designated Provider Care Teams.  These Care Teams include your primary Cardiologist (physician) and Advanced Practice Providers (APPs -  Physician Assistants and Nurse Practitioners) who all work together to provide you with the care you need, when you need it.  We recommend signing up for the patient portal called "MyChart".  Sign up information is provided on this After Visit Summary.  MyChart is used to connect with patients for Virtual Visits (Telemedicine).  Patients are able to view lab/test results, encounter notes, upcoming appointments, etc.  Non-urgent messages can be sent to your provider as well.   To learn more about what you can do with MyChart, go to [email or web address].    Your next appointment:   6 month(s)  The format for your next appointment:   In Person  Provider:   Quay Burow, MD   Other Instructions Can Hold coumadin for 5 Days Prior to Procedure.   Signed, Darreld Mclean, PA-C  04/09/2021 10:22 AM    [facility]

## 2021-04-06 ENCOUNTER — Ambulatory Visit (INDEPENDENT_AMBULATORY_CARE_PROVIDER_SITE_OTHER): Payer: Medicare Other | Admitting: Pharmacist Clinician (PhC)/ Clinical Pharmacy Specialist

## 2021-04-06 ENCOUNTER — Other Ambulatory Visit: Payer: Self-pay

## 2021-04-06 ENCOUNTER — Telehealth: Payer: Self-pay

## 2021-04-06 DIAGNOSIS — I2699 Other pulmonary embolism without acute cor pulmonale: Secondary | ICD-10-CM | POA: Diagnosis not present

## 2021-04-06 DIAGNOSIS — Z7901 Long term (current) use of anticoagulants: Secondary | ICD-10-CM

## 2021-04-06 DIAGNOSIS — I824Y9 Acute embolism and thrombosis of unspecified deep veins of unspecified proximal lower extremity: Secondary | ICD-10-CM

## 2021-04-06 LAB — POCT INR: INR: 2.8 (ref 2.0–3.0)

## 2021-04-06 NOTE — Telephone Encounter (Signed)
Patient with diagnosis of PE/DVT on warfarin for anticoagulation.    Procedure:  Anterior cervical decompression fusion with instrumentation and allograft-Cervical 3-4 Date of procedure: 04/19/21  He had bilateral PE and was placed on Eliquis, however he ended up developing a left popliteal DVT while on the Eliquis.  Eliquis was eventually switched to Coumadin due to failure.  CrCl 56.9 ml/min Platelet count 268  Per office protocol, patient can hold warfarin for 5 days prior to procedure.   Patient WILL need bridging with Lovenox (enoxaparin) around procedure.  [facility] coumadin clinic will coordinate bridge

## 2021-04-06 NOTE — Telephone Encounter (Signed)
   Patient Name: Jonathan Dickson  DOB: 1956-03-29 MRN: [id number]  Primary Cardiologist: Quay Burow, MD  Chart reviewed as part of pre-operative protocol coverage for upcoming anterior cervical decompression fusion with instrumentation and allograft of C3-C4 on 04/19/21.  He has an upcoming visit scheduled with Sande Rives, PA-C for 04/09/21. He was seen by Almyra Deforest 08/16/2020 and was felt to be doing well at that time with no further dizziness after his previous syncopal episode. He has complex history of HTN, pre-DM, bilateral PE/DVT failed Eliquis (now on Warfarin). He has history of cardiac amyloidosis and history of HFrEF / NICM with cMRI 2019 showing EF 20% now s/p 05/2018 ICD by Dr. Curt Bears. [place] 6/20217 with nl cors and codominant system, as well as mild to moderately elevated LVEDP.   Per office protocol, the provider should assess clearance at time of office visit and should forward their finalized clearance decision to requesting party below. I will route this message to Sande Rives, PA-C as an FYI for her appointment.   Callie: Please route your completed preoperative cardiac evaluation to the below surgeon's office.  As he is currently taking Warfarin, I will route this message to the pre-operative pharmacy pool for recommendations regarding his anticoagulation / Warfarin and briding before his upcoming surgery.  I will remove this message from the pre-op box.  Arvil Chaco, PA-C 04/06/2021, 11:21 AM

## 2021-04-06 NOTE — Telephone Encounter (Deleted)
Call placed to [facility] coordinator at [phone].  Left detailed message requesting call back to confirm what type of anesthesia will be used for procedure.  Per surgical clearance request states "If patient is taking warfarin please provide protocol".  No specific request received regarding warfarin.  Will forward to preop pool.

## 2021-04-06 NOTE — Telephone Encounter (Addendum)
   Patient Name: ABIR CRAINE  DOB: 1956-06-19 MRN: [id number]  Primary Cardiologist: Quay Burow, MD  Chart reviewed as part of pre-operative protocol coverage. Information will now be available for APP to reference at time of OV on Monday for cardiac clearance visit. Will route as FYI to surgeon's office to let them know appointment is pending for surgical clearance and that patient will require bridging with anticoagulation. Sent message to our pharmacy team to ensure they will help coordinate this. Will remove from preop APP box.  Addendum: Melissa, pharmD, confirms she spoke with Erasmo Downer, pharmD who indicated she would take care of setting up bridging appt.  Charlie Pitter, PA-C 04/06/2021, 2:18 PM

## 2021-04-06 NOTE — Telephone Encounter (Addendum)
   [facility] Pre-operative Risk Assessment    Patient Name: Jonathan Dickson  DOB: May 15, 1956 MRN: [id number]   Request for surgical clearance:  What type of surgery is being performed? Anterior cervical decompression fusion with instrumentation and allograft-Cervical 3-4  When is this surgery scheduled? April 19, 2021  What type of clearance is required (medical clearance vs. Pharmacy clearance to hold med vs. Both)? both  Are there any medications that need to be held prior to surgery and how long? Warfarin- "If patient is taking warfarin please provide protocol"  Practice name and name of physician performing surgery? [facility]- [name] MD  What is the office phone number? [phone]   7.   What is the office fax number? [phone]  8.   Anesthesia type (None, local, MAC, general) ? general   [name] 04/06/2021, 10:55 AM  _________________________________________________________________   (provider comments below)

## 2021-04-09 ENCOUNTER — Other Ambulatory Visit: Payer: Self-pay

## 2021-04-09 ENCOUNTER — Ambulatory Visit (INDEPENDENT_AMBULATORY_CARE_PROVIDER_SITE_OTHER): Payer: Medicare Other | Admitting: Student

## 2021-04-09 ENCOUNTER — Encounter: Payer: Self-pay | Admitting: Student

## 2021-04-09 VITALS — BP 94/60 | HR 90 | Ht 66.0 in | Wt 170.4 lb

## 2021-04-09 DIAGNOSIS — I5042 Chronic combined systolic (congestive) and diastolic (congestive) heart failure: Secondary | ICD-10-CM

## 2021-04-09 DIAGNOSIS — E854 Organ-limited amyloidosis: Secondary | ICD-10-CM

## 2021-04-09 DIAGNOSIS — I10 Essential (primary) hypertension: Secondary | ICD-10-CM

## 2021-04-09 DIAGNOSIS — E118 Type 2 diabetes mellitus with unspecified complications: Secondary | ICD-10-CM | POA: Diagnosis not present

## 2021-04-09 DIAGNOSIS — Z95810 Presence of automatic (implantable) cardiac defibrillator: Secondary | ICD-10-CM

## 2021-04-09 DIAGNOSIS — Z86718 Personal history of other venous thrombosis and embolism: Secondary | ICD-10-CM

## 2021-04-09 DIAGNOSIS — Z01818 Encounter for other preprocedural examination: Secondary | ICD-10-CM

## 2021-04-09 DIAGNOSIS — Z86711 Personal history of pulmonary embolism: Secondary | ICD-10-CM | POA: Diagnosis not present

## 2021-04-09 DIAGNOSIS — I428 Other cardiomyopathies: Secondary | ICD-10-CM

## 2021-04-09 DIAGNOSIS — I43 Cardiomyopathy in diseases classified elsewhere: Secondary | ICD-10-CM

## 2021-04-09 NOTE — Patient Instructions (Signed)
Medication Instructions:  No Changes *If you need a refill on your cardiac medications before your next appointment, please call your pharmacy*   Lab Work: No Labs If you have labs (blood work) drawn today and your tests are completely normal, you will receive your results only by: [place] (if you have MyChart) OR A paper copy in the mail If you have any lab test that is abnormal or we need to change your treatment, we will call you to review the results.   Testing/Procedures: No Testing   Follow-Up: At [facility], you and your health needs are our priority.  As part of our continuing mission to provide you with exceptional heart care, we have created designated Provider Care Teams.  These Care Teams include your primary Cardiologist (physician) and Advanced Practice Providers (APPs -  Physician Assistants and Nurse Practitioners) who all work together to provide you with the care you need, when you need it.  We recommend signing up for the patient portal called "MyChart".  Sign up information is provided on this After Visit Summary.  MyChart is used to connect with patients for Virtual Visits (Telemedicine).  Patients are able to view lab/test results, encounter notes, upcoming appointments, etc.  Non-urgent messages can be sent to your provider as well.   To learn more about what you can do with MyChart, go to [email or web address].    Your next appointment:   6 month(s)  The format for your next appointment:   In Person  Provider:   Quay Burow, MD   Other Instructions Can Hold coumadin for 5 Days Prior to Procedure.

## 2021-04-09 NOTE — Addendum Note (Signed)
Addended by: Angelena Form on: 04/09/2021 11:20 AM   Modules accepted: Orders

## 2021-04-09 NOTE — Pre-Procedure Instructions (Signed)
Surgical Instructions    Your procedure is scheduled on 04/19/21.  Report to [facility] Main Entrance "A" at 11:40 A.M., then check in with the Admitting office.  Call this number if you have problems the morning of surgery:  [phone]   If you have any questions prior to your surgery date call [phone]: Open Monday-Friday 8am-4pm    Remember:  Do not eat after midnight the night before your surgery  You may drink clear liquids until 10:30 the morning of your surgery.   Clear liquids allowed are: Water, Non-Citrus Juices (without pulp), Carbonated Beverages, Clear Tea, Black Coffee Only, and Gatorade   Patient Instructions  The night before surgery:  No food after midnight. ONLY clear liquids after midnight  The day of surgery (if you have diabetes): Drink ONE (1) 12 oz G2 given to you in your pre admission testing appointment by 1030 the morning of surgery. Drink in one sitting. Do not sip.  This drink was given to you during your hospital  pre-op appointment visit.  Nothing else to drink after completing the  12 oz bottle of G2.         If you have questions, please contact your surgeon's office.   Take these medicines the morning of surgery with A SIP OF WATER  acetaminophen (TYLENOL) 500 MG tablet if needed atorvastatin (LIPITOR) 80 MG tablet carvedilol (COREG) 25 MG tablet Tafamidis 61 MG CAPS  Please follow your surgeons instructions for warfarin (COUMADIN) 5 MG tablet. If you have no received instructions, please contact your surgeon's office.       As of today, STOP taking any Aspirin (unless otherwise instructed by your surgeon) Aleve, Naproxen, Ibuprofen, Motrin, Advil, Goody's, BC's, all herbal medications, fish oil, and all vitamins.  WHAT DO I DO ABOUT MY DIABETES MEDICATION?  Do not take oral diabetes medicines (pills) the morning of surgery. dapagliflozin propanediol (FARXIGA) 10 MG TABS tablet - DO NOT take morning of surgery OR day before  surgery. metFORMIN (GLUCOPHAGE) 500 MG tablet - DO NOT take morning of surgery.  HOW TO MANAGE YOUR DIABETES BEFORE AND AFTER SURGERY  Why is it important to control my blood sugar before and after surgery? Improving blood sugar levels before and after surgery helps healing and can limit problems. A way of improving blood sugar control is eating a healthy diet by:  Eating less sugar and carbohydrates  Increasing activity/exercise  Talking with your doctor about reaching your blood sugar goals High blood sugars (greater than 180 mg/dL) can raise your risk of infections and slow your recovery, so you will need to focus on controlling your diabetes during the weeks before surgery. Make sure that the doctor who takes care of your diabetes knows about your planned surgery including the date and location.  How do I manage my blood sugar before surgery? Check your blood sugar at least 4 times a day, starting 2 days before surgery, to make sure that the level is not too high or low.  Check your blood sugar the morning of your surgery when you wake up and every 2 hours until you get to the Short Stay unit.  If your blood sugar is less than 70 mg/dL, you will need to treat for low blood sugar: Do not take insulin. Treat a low blood sugar (less than 70 mg/dL) with  cup of clear juice (cranberry or apple), 4 glucose tablets, OR glucose gel. Recheck blood sugar in 15 minutes after treatment (to make sure it  is greater than 70 mg/dL). If your blood sugar is not greater than 70 mg/dL on recheck, call [phone] for further instructions. Report your blood sugar to the short stay nurse when you get to Short Stay.  If you are admitted to the hospital after surgery: Your blood sugar will be checked by the staff and you will probably be given insulin after surgery (instead of oral diabetes medicines) to make sure you have good blood sugar levels. The goal for blood sugar control after surgery is 80-180  mg/dL.  Do not wear jewelry or makeup Do not wear lotions, powders, perfumes/colognes, or deodorant. Do not shave 48 hours prior to surgery.  Men may shave face and neck. Do not bring valuables to the hospital. DO Not wear nail polish, gel polish, artificial nails, or any other type of covering on  natural nails including finger and toenails. If patients have artificial nails, gel coating, etc. that need to be removed by a nail salon please have this removed prior to surgery or surgery may need to be canceled/delayed if the surgeon/ anesthesia feels like the patient is unable to be adequately monitored.             [facility] is not responsible for any belongings or valuables.  Do NOT Smoke (Tobacco/Vaping) or drink Alcohol 24 hours prior to your procedure If you use a CPAP at night, you may bring all equipment for your overnight stay.   Contacts, glasses, dentures or bridgework may not be worn into surgery, please bring cases for these belongings   For patients admitted to the hospital, discharge time will be determined by your treatment team.   Patients discharged the day of surgery will not be allowed to drive home, and someone needs to stay with them for 24 hours.  ONLY 1 SUPPORT PERSON MAY BE PRESENT WHILE YOU ARE IN SURGERY. IF YOU ARE TO BE ADMITTED ONCE YOU ARE IN YOUR ROOM YOU WILL BE ALLOWED TWO (2) VISITORS.  Minor children may have two parents present. Special consideration for safety and communication needs will be reviewed on a case by case basis.  Special instructions:    Oral Hygiene is also important to reduce your risk of infection.  Remember - BRUSH YOUR TEETH THE MORNING OF SURGERY WITH YOUR REGULAR TOOTHPASTE   [place]- Preparing For Surgery  Before surgery, you can play an important role. Because skin is not sterile, your skin needs to be as free of germs as possible. You can reduce the number of germs on your skin by washing with CHG (chlorahexidine gluconate)  Soap before surgery.  CHG is an antiseptic cleaner which kills germs and bonds with the skin to continue killing germs even after washing.     Please do not use if you have an allergy to CHG or antibacterial soaps. If your skin becomes reddened/irritated stop using the CHG.  Do not shave (including legs and underarms) for at least 48 hours prior to first CHG shower. It is OK to shave your face.  Please follow these instructions carefully.     Shower the NIGHT BEFORE SURGERY and the MORNING OF SURGERY with CHG Soap.   If you chose to wash your hair, wash your hair first as usual with your normal shampoo. After you shampoo, rinse your hair and body thoroughly to remove the shampoo.  Then [employer] and genitals (private parts) with your normal soap and rinse thoroughly to remove soap.  After that Use CHG Soap  as you would any other liquid soap. You can apply CHG directly to the skin and wash gently with a scrungie or a clean washcloth.   Apply the CHG Soap to your body ONLY FROM THE NECK DOWN.  Do not use on open wounds or open sores. Avoid contact with your eyes, ears, mouth and genitals (private parts). Wash Face and genitals (private parts)  with your normal soap.   Wash thoroughly, paying special attention to the area where your surgery will be performed.  Thoroughly rinse your body with warm water from the neck down.  DO NOT shower/wash with your normal soap after using and rinsing off the CHG Soap.  Pat yourself dry with a CLEAN TOWEL.  Wear CLEAN PAJAMAS to bed the night before surgery  Place CLEAN SHEETS on your bed the night before your surgery  DO NOT SLEEP WITH PETS.   Day of Surgery:  Take a shower with CHG soap. Wear Clean/Comfortable clothing the morning of surgery Do not apply any deodorants/lotions.   Remember to brush your teeth WITH YOUR REGULAR TOOTHPASTE.   Please read over the following fact sheets that you were given.

## 2021-04-10 ENCOUNTER — Other Ambulatory Visit: Payer: Self-pay

## 2021-04-10 ENCOUNTER — Encounter (HOSPITAL_COMMUNITY)
Admission: RE | Admit: 2021-04-10 | Discharge: 2021-04-10 | Disposition: A | Discharge status: Home / Self Care | Payer: Medicare Other | Source: Ambulatory Visit | Attending: Orthopedic Surgery | Admitting: Orthopedic Surgery

## 2021-04-10 ENCOUNTER — Encounter (HOSPITAL_COMMUNITY): Payer: Self-pay

## 2021-04-10 ENCOUNTER — Encounter: Payer: Self-pay | Admitting: Cardiology

## 2021-04-10 DIAGNOSIS — M5412 Radiculopathy, cervical region: Secondary | ICD-10-CM | POA: Diagnosis not present

## 2021-04-10 DIAGNOSIS — Z01812 Encounter for preprocedural laboratory examination: Secondary | ICD-10-CM | POA: Insufficient documentation

## 2021-04-10 HISTORY — DX: Type 2 diabetes mellitus without complications: E11.9

## 2021-04-10 HISTORY — DX: Presence of automatic (implantable) cardiac defibrillator: Z95.810

## 2021-04-10 HISTORY — DX: Dyspnea, unspecified: R06.00

## 2021-04-10 HISTORY — DX: Cardiac arrhythmia, unspecified: I49.9

## 2021-04-10 LAB — URINALYSIS, ROUTINE W REFLEX MICROSCOPIC
Bacteria, UA: NONE SEEN
Bilirubin Urine: NEGATIVE
Glucose, UA: >=500 mg/dL — AB
Hgb urine dipstick: NEGATIVE
Ketones, ur: NEGATIVE mg/dL
Leukocytes,Ua: NEGATIVE
Nitrite: NEGATIVE
Protein, ur: NEGATIVE mg/dL
Specific Gravity, Urine: 1.026 (ref 1.005–1.030)
pH: 5 (ref 5.0–8.0)

## 2021-04-10 LAB — COMPREHENSIVE METABOLIC PANEL
ALT: 23 U/L (ref 0–44)
AST: 35 U/L (ref 15–41)
Albumin: 3.7 g/dL (ref 3.5–5.0)
Alkaline Phosphatase: 71 U/L (ref 38–126)
Anion gap: 11 (ref 5–15)
BUN: 19 mg/dL (ref 8–23)
CO2: 15 mmol/L — ABNORMAL LOW (ref 22–32)
Calcium: 9.2 mg/dL (ref 8.9–10.3)
Chloride: 110 mmol/L (ref 98–111)
Creatinine, Ser: 1.24 mg/dL (ref 0.61–1.24)
GFR, Estimated: >60 mL/min (ref >60)
Glucose, Bld: 100 mg/dL — ABNORMAL HIGH (ref 70–99)
Potassium: 4.4 mmol/L (ref 3.5–5.1)
Sodium: 136 mmol/L (ref 135–145)
Total Bilirubin: 0.5 mg/dL (ref 0.3–1.2)
Total Protein: 7.1 g/dL (ref 6.5–8.1)

## 2021-04-10 LAB — PROTIME-INR
INR: 2.4 — ABNORMAL HIGH (ref 0.8–1.2)
Prothrombin Time: 26.4 seconds — ABNORMAL HIGH (ref 11.4–15.2)

## 2021-04-10 LAB — CBC WITH DIFFERENTIAL/PLATELET
Abs Immature Granulocytes: 0.04 10*3/uL (ref 0.00–0.07)
Basophils Absolute: 0 10*3/uL (ref 0.0–0.1)
Basophils Relative: 1 %
Eosinophils Absolute: 0.1 10*3/uL (ref 0.0–0.5)
Eosinophils Relative: 2 %
HCT: 46 % (ref 39.0–52.0)
Hemoglobin: 14.2 g/dL (ref 13.0–17.0)
Immature Granulocytes: 1 %
Lymphocytes Relative: 32 %
Lymphs Abs: 2.1 10*3/uL (ref 0.7–4.0)
MCH: 24.9 pg — ABNORMAL LOW (ref 26.0–34.0)
MCHC: 30.9 g/dL (ref 30.0–36.0)
MCV: 80.7 fL (ref 80.0–100.0)
Monocytes Absolute: 0.6 10*3/uL (ref 0.1–1.0)
Monocytes Relative: 9 %
Neutro Abs: 3.7 10*3/uL (ref 1.7–7.7)
Neutrophils Relative %: 55 %
Platelets: 208 10*3/uL (ref 150–400)
RBC: 5.7 MIL/uL (ref 4.22–5.81)
RDW: 18.7 % — ABNORMAL HIGH (ref 11.5–15.5)
WBC: 6.5 10*3/uL (ref 4.0–10.5)
nRBC: 0 % (ref 0.0–0.2)

## 2021-04-10 LAB — TYPE AND SCREEN
ABO/RH(D): A POS
Antibody Screen: NEGATIVE

## 2021-04-10 LAB — APTT: aPTT: 38 seconds — ABNORMAL HIGH (ref 24–36)

## 2021-04-10 LAB — HEMOGLOBIN A1C
Hgb A1c MFr Bld: 7.4 % — ABNORMAL HIGH (ref 4.8–5.6)
Mean Plasma Glucose: 165.68 mg/dL

## 2021-04-10 LAB — SURGICAL PCR SCREEN
MRSA, PCR: NEGATIVE
Staphylococcus aureus: NEGATIVE

## 2021-04-10 LAB — GLUCOSE, CAPILLARY: Glucose-Capillary: 103 mg/dL — ABNORMAL HIGH (ref 70–99)

## 2021-04-10 NOTE — Progress Notes (Signed)
PCP - Karle Plumber, MD w/ [facility] Cardiologist - Quay Burow, MD EP- Will Meredith Leeds, MD  PPM/ICD - [employer] ICD Device Orders - Request for device orders emailed 04/10/21 Rep Notified - [place]le Guard Emailed 04/10/21  Chest x-ray - 04/01/21 EKG - 04/03/21 Stress Test - 04/03/18 ECHO - 02/14/20 Cardiac Cath - 03/11/16  Sleep Study - Denies   Fasting Blood Sugar - 80-100 Checks Blood Sugar x2 weekly  Blood Thinner Instructions: Per Cardiologist, stop Coumadin 5 days prior to sx. (See Cardiology Note 04/09/21) Aspirin Instructions: N/A  ERAS Protcol - Yes PRE-SURGERY Ensure or G2- G2 given  COVID TEST- N/A; Surgery posted as Ambulatory   Anesthesia review: Yes, Cardiac hx  Patient denies shortness of breath, fever, cough and chest pain at PAT appointment   All instructions explained to the patient, with a verbal understanding of the material. Patient agrees to go over the instructions while at home for a better understanding. The opportunity to ask questions was provided.

## 2021-04-10 NOTE — Pre-Procedure Instructions (Signed)
Surgical Instructions    Your procedure is scheduled on Thursday July 21st.  Report to [facility] Main Entrance "A" at 10:40 A.M., then check in with the Admitting office.  Call this number if you have problems the morning of surgery:  [phone]   If you have any questions prior to your surgery date call [phone]: Open Monday-Friday 8am-4pm    Remember:  Do not eat after midnight the night before your surgery.  You may drink clear liquids until 10:30 AM the morning of your surgery.   Clear liquids allowed are: Water, Non-Citrus Juices (without pulp), Carbonated Beverages, Clear Tea, Black Coffee Only, and Gatorade.    The day of surgery (if you have diabetes): Drink ONE (1) Gatorade G2 given to you in your pre admission testing appointment by 10:30 AM the morning of surgery. Drink in one sitting. Do not sip.  This drink was given to you during your hospital pre-op appointment visit.  Nothing else to drink after completing the 12 oz bottle of G2.         If you have questions, please contact your surgeon's office.    Take these medicines the morning of surgery with A SIP OF WATER:  acetaminophen (TYLENOL)  atorvastatin (LIPITOR)  carvedilol (COREG)  Tafamidis  Please follow your surgeons instructions for warfarin (COUMADIN). If you have no received instructions, please contact your surgeon's office.      As of today, STOP taking any Aspirin (unless otherwise instructed by your surgeon), diclofenac Sodium (VOLTAREN) GEL Aleve, Naproxen, Ibuprofen, Motrin, Advil, Goody's, BC's, all herbal medications, fish oil, and all vitamins.  WHAT DO I DO ABOUT MY DIABETES MEDICATION?  Do not take oral diabetes medicines (pills) the morning of surgery. dapagliflozin propanediol (FARXIGA) - DO NOT take morning of surgery OR day before surgery. metFORMIN (GLUCOPHAGE) - DO NOT take morning of surgery.    HOW TO MANAGE YOUR DIABETES BEFORE AND AFTER SURGERY  Why is it important to  control my blood sugar before and after surgery? Improving blood sugar levels before and after surgery helps healing and can limit problems. A way of improving blood sugar control is eating a healthy diet by:  Eating less sugar and carbohydrates  Increasing activity/exercise  Talking with your doctor about reaching your blood sugar goals High blood sugars (greater than 180 mg/dL) can raise your risk of infections and slow your recovery, so you will need to focus on controlling your diabetes during the weeks before surgery. Make sure that the doctor who takes care of your diabetes knows about your planned surgery including the date and location.  How do I manage my blood sugar before surgery? Check your blood sugar at least 4 times a day, starting 2 days before surgery, to make sure that the level is not too high or low.  Check your blood sugar the morning of your surgery when you wake up and every 2 hours until you get to the Short Stay unit.  If your blood sugar is less than 70 mg/dL, you will need to treat for low blood sugar: Do not take insulin. Treat a low blood sugar (less than 70 mg/dL) with  cup of clear juice (cranberry or apple), 4 glucose tablets, OR glucose gel. Recheck blood sugar in 15 minutes after treatment (to make sure it is greater than 70 mg/dL). If your blood sugar is not greater than 70 mg/dL on recheck, call [phone] for further instructions. Report your blood sugar to the short stay nurse  when you get to Short Stay.  If you are admitted to the hospital after surgery: Your blood sugar will be checked by the staff and you will probably be given insulin after surgery (instead of oral diabetes medicines) to make sure you have good blood sugar levels. The goal for blood sugar control after surgery is 80-180 mg/dL.    Do not wear jewelry Do not wear lotions, powders, colognes, or deodorant. Men may shave face and neck. Do not bring valuables to the hospital.               [facility] is not responsible for any belongings or valuables.  Do NOT Smoke (Tobacco/Vaping) or drink Alcohol 24 hours prior to your procedure If you use a CPAP at night, you may bring all equipment for your overnight stay.   Contacts, glasses, dentures or bridgework may not be worn into surgery, please bring cases for these belongings   For patients admitted to the hospital, discharge time will be determined by your treatment team.   Patients discharged the day of surgery will not be allowed to drive home, and someone needs to stay with them for 24 hours.  ONLY 1 SUPPORT PERSON MAY BE PRESENT WHILE YOU ARE IN SURGERY. IF YOU ARE TO BE ADMITTED ONCE YOU ARE IN YOUR ROOM YOU WILL BE ALLOWED TWO (2) VISITORS.  Minor children may have two parents present. Special consideration for safety and communication needs will be reviewed on a case by case basis.  Special instructions:    Oral Hygiene is also important to reduce your risk of infection.  Remember - BRUSH YOUR TEETH THE MORNING OF SURGERY WITH YOUR REGULAR TOOTHPASTE   [place]- Preparing For Surgery  Before surgery, you can play an important role. Because skin is not sterile, your skin needs to be as free of germs as possible. You can reduce the number of germs on your skin by washing with CHG (chlorahexidine gluconate) Soap before surgery.  CHG is an antiseptic cleaner which kills germs and bonds with the skin to continue killing germs even after washing.     Please do not use if you have an allergy to CHG or antibacterial soaps. If your skin becomes reddened/irritated stop using the CHG.  Do not shave (including legs and underarms) for at least 48 hours prior to first CHG shower. It is OK to shave your face.  Please follow these instructions carefully.     Shower the NIGHT BEFORE SURGERY and the MORNING OF SURGERY with CHG Soap.   If you chose to wash your hair, wash your hair first as usual with your normal shampoo.  After you shampoo, rinse your hair and body thoroughly to remove the shampoo.  Then [employer] and genitals (private parts) with your normal soap and rinse thoroughly to remove soap.  After that Use CHG Soap as you would any other liquid soap. You can apply CHG directly to the skin and wash gently with a scrungie or a clean washcloth.   Apply the CHG Soap to your body ONLY FROM THE NECK DOWN.  Do not use on open wounds or open sores. Avoid contact with your eyes, ears, mouth and genitals (private parts). Wash Face and genitals (private parts)  with your normal soap.   Wash thoroughly, paying special attention to the area where your surgery will be performed.  Thoroughly rinse your body with warm water from the neck down.  DO NOT shower/wash with your normal soap  after using and rinsing off the CHG Soap.  Pat yourself dry with a CLEAN TOWEL.  Wear CLEAN PAJAMAS to bed the night before surgery  Place CLEAN SHEETS on your bed the night before your surgery  DO NOT SLEEP WITH PETS.   Day of Surgery:  Take a shower with CHG soap. Wear Clean/Comfortable clothing the morning of surgery Do not apply any deodorants/lotions.   Remember to brush your teeth WITH YOUR REGULAR TOOTHPASTE.   Please read over the following fact sheets that you were given.

## 2021-04-10 NOTE — Progress Notes (Signed)
[place] DEVICE PROGRAMMING  Patient Information: Name:  GLENARD KEESLING  DOB:  28-Mar-1956  MRN:  [id number]    Planned Procedure:  Anterior Cervical Decompression Fusion C3-C4 with instrumentation and allograft.  Surgeon:  Dr. [name]  Date of Procedure:  04/19/21  Cautery will be used.  Position during surgery:  Supine   Please send documentation back to:  [facility] (Fax # [phone])  Device Information:  Clinic EP Physician:  Allegra Lai, MD   Device Type:  Defibrillator Manufacturer and Phone #:  [employer] Jude/[employer]: [phone] Pacemaker Dependent?:  Unknown (overdue in-clinic check) Date of Last Device Check:  03/27/21 (remote) Normal Device Function?:  Yes.    Electrophysiologist's Recommendations:  Have magnet available. Provide continuous ECG monitoring when magnet is used or reprogramming is to be performed.  Patient is overdue in-clinic and will need industry present for testing prior to procedure.   Per Device Clinic Standing Orders, Simone Curia, RN  10:57 AM 04/10/2021

## 2021-04-11 NOTE — Telephone Encounter (Signed)
Can you get him an appointment for Friday to set up lovenox bridge - procedure on the 21st.  thx

## 2021-04-11 NOTE — Progress Notes (Signed)
Windle Guard, [employer] device rep notified of device orders.
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
## 2021-04-12 NOTE — Anesthesia Preprocedure Evaluation (Addendum)
Anesthesia Evaluation  Patient identified by MRN, date of birth, ID band Patient awake    Reviewed: Allergy & Precautions, NPO status , Patient's Chart, lab work & pertinent test results  Airway Mallampati: II  TM Distance: >3 FB Neck ROM: Full    Dental no notable dental hx. (+) Edentulous Upper, Edentulous Lower,    Pulmonary neg pulmonary ROS, former smoker,    Pulmonary exam normal breath sounds clear to auscultation       Cardiovascular hypertension, + Past MI and +CHF  Normal cardiovascular exam+ dysrhythmias + Cardiac Defibrillator  Rhythm:Regular Rate:Normal     Neuro/Psych Seizures -, Well Controlled,  PSYCHIATRIC DISORDERS Anxiety Depression  Neuromuscular disease    GI/Hepatic Neg liver ROS, hiatal hernia,   Endo/Other  diabetes, Type 2, Oral Hypoglycemic Agents  Renal/GU negative Renal ROS  negative genitourinary   Musculoskeletal negative musculoskeletal ROS (+)   Abdominal   Peds negative pediatric ROS (+)  Hematology negative hematology ROS (+)   Anesthesia Other Findings   Reproductive/Obstetrics negative OB ROS                           Anesthesia Physical Anesthesia Plan  ASA: 3  Anesthesia Plan: General   Post-op Pain Management:    Induction: Intravenous  PONV Risk Score and Plan: 2 and Ondansetron and Treatment may vary due to age or medical condition  Airway Management Planned: Oral ETT  Additional Equipment: None  Intra-op Plan:   Post-operative Plan: Extubation in OR  Informed Consent:     Dental advisory given  Plan Discussed with: Anesthesiologist and CRNA  Anesthesia Plan Comments: (PAT note written 04/12/2021 by Myra Gianotti, PA-C.   DISCUSSION: Patient is a 65 year old male scheduled for the above procedure. Procedure was posted as ambulatory.  History includes former smoker (12 pack years, quit 03/15/03), non-ischemic cardiomyopathy  (normal coronaries 2017;, amyloidosis or infiltrative CM 2019 cMRI, on Tafamidis), combined systolic and diastolic CHF, Abbott/St Jude ICD (implant 06/17/18), PE/DVT (large bilateral PE 05/12/18, Eliquis started but developed acute LLE DVT 08/04/19 and changed to warfarin), HTN, DM2, hiatal hernia, dyspnea, ventral hernia (s/p repair 10/25/19), neck surgery (C4-7 ACDF 07/10/16),previous polysubstance abuse - including heroin (quit 2004),+ COVID-19 09/24/19.  Last cardiology evaluation 04/09/21 by Sande Rives, PA-C for preoperative evaluation and follow-up hypotension. Syncope 06/2020 and presyncope 03/31/21 in the setting of hypotension. Hydralazine held starting 04/01/21, and no recurrent symptoms. BP 94/60, but asymptomatic, so HF medications continued. In regards to preoperative evaluation, she wrote: "Pre-Op Evaluation - Patient scheduled for anterior cervical decompression/fusion on 04/19/2021. -Patient doing well from cardiac standpoint. No angina or acute CHF symptoms. No syncope. - Per Revised Cardiac Risk Index, considered low risk with 0.9% chance of adverse cardiac events perioperatively. Patient able to complete >4.0 METS.Therefore, based on ACC/AHA guidelines, patient would be at acceptable risk for the planned procedure without further cardiovascular testing.Per Pharmacy, patient can hold Coumadinfor 5 days prior to admission. However, he WILL need bridging with Lovenox around procedure. Our Coumadin Clinic will help arrange this."  Electrophysiologist's Recommendations: Have magnet available. Provide continuous ECG monitoring when magnet is used or reprogramming is to be performed. Patient is overdue in-clinic and will need industry present for testing prior to procedure. CPX 04/03/18: CPX: Exercise testing with gas exchange demonstrates a mildly reduced peak VO2 of 23.1 ml/kg/min (76% of the age/gender/weight matched sedentary norms). The RER of 0.93 indicates a submaximal effort.  When adjusted to the  patient's ideal body weight of 158.1 lb (71.7 kg) the peak VO2 is 24.0 ml/kg (ibw)/min (79% of the ibw-adjusted predicted). The VE/VCO2 slope is significantly elevated and indicates excessive dead space ventilation. The oxygen uptake efficiency slope (OUES) is within normal range. The VO2 at the ventilatory threshold was normal at 56% of the predicted peak VO2 and 73% measured PVO2. At peak exercise, the ventilation reached 88% of the measured MVV indicating ventilatory reserve was nearly depleted. The O2pulse (a surrogate for stroke volume) increased with incremental exercise, reaching peak at 14 ml/beat (100% predicted).  - Conclusion: The interpretation of this test is limited due to submaximal effort during the exercise. Based on available data, exercise testing with gas exchange demonstrates mild to moderate HF limitation with elevated VE/VCO2 slope (41) indicating poor short-term prognosis.    MR Cardiac 04/01/18: IMPRESSION: 1. Mild LVE with mild LVH septal thickness 13 mm. Diffuse hypokinesis worse in the septum apex and lateral walls EF 28% 2. Abnormal post gadolinium inversion recovery sequences with failure to null and uptake in the mid/subepicardial septum, inferior wall and lateral walls with some apical sparing - Findings consistent with amyloidosis or infiltrative cardiomyopathy   Cardiac Cath 03/11/16: There is mild to moderate left ventricular systolic dysfunction. Angiographically normal coronary arteries with a codominant system Mild-moderately elevated LVEDP Nonischemic Cardiomyopathy Suspect chest pain is related to increased LV filling pressures. Probably still could tolerate some diuresis.)       Anesthesia Quick Evaluation

## 2021-04-13 ENCOUNTER — Ambulatory Visit (INDEPENDENT_AMBULATORY_CARE_PROVIDER_SITE_OTHER): Payer: Medicare Other

## 2021-04-13 ENCOUNTER — Other Ambulatory Visit: Payer: Self-pay

## 2021-04-13 DIAGNOSIS — I2699 Other pulmonary embolism without acute cor pulmonale: Secondary | ICD-10-CM | POA: Diagnosis not present

## 2021-04-13 DIAGNOSIS — Z7901 Long term (current) use of anticoagulants: Secondary | ICD-10-CM | POA: Diagnosis not present

## 2021-04-13 LAB — POCT INR: INR: 1.1 — AB (ref 2.0–3.0)

## 2021-04-13 MED ORDER — ENOXAPARIN SODIUM 80 MG/0.8ML IJ SOSY: 80.0000 mg | PREFILLED_SYRINGE | Freq: Two times a day (BID) | INTRAMUSCULAR | 1 refills | Status: DC

## 2021-04-13 NOTE — Patient Instructions (Signed)
Take 2.5 tablets tonight only and then continue taking 1.5 tablets daily.  Repeat INR on 7/27 post - bridge; Surgery 7/21   7/15: Last dose of warfarin.  7/16: No warfarin or enoxaparin (Lovenox).  7/17: Inject enoxaparin 80mg  in the fatty abdominal tissue at least 2 inches from the belly button twice a day about 12 hours apart, 8am and 8pm rotate sites. No warfarin.  7/18: Inject enoxaparin in the fatty tissue every 12 hours, 8am and 8pm. No warfarin.  7/19: Inject enoxaparin in the fatty tissue every 12 hours, 8am and 8pm. No warfarin.  7/20: Inject enoxaparin in the fatty tissue in the morning at 8 am (No PM dose). No warfarin.  7/21: Procedure Day - No enoxaparin - Resume warfarin in the evening or as directed by doctor (take an extra half tablet with usual dose for 2 days then resume normal dose).  7/22: Resume enoxaparin inject in the fatty tissue every 12 hours and take warfarin  7/23: Inject enoxaparin in the fatty tissue every 12 hours and take warfarin  7/24: Inject enoxaparin in the fatty tissue every 12 hours and take warfarin  7/25: Inject enoxaparin in the fatty tissue every 12 hours and take warfarin  7/26: Inject enoxaparin in the fatty tissue every 12 hours and take warfarin  7/27: warfarin appt to check INR.

## 2021-04-16 NOTE — Addendum Note (Signed)
Addended by: Sande Rives on: 04/16/2021 07:20 AM   Modules accepted: Orders

## 2021-04-17 ENCOUNTER — Telehealth: Payer: Self-pay | Admitting: Cardiovascular Disease

## 2021-04-17 NOTE — Telephone Encounter (Signed)
Received a call back from Jonathan Dickson stating she received my voicemail. I informed her that I have faxed over the office notes and if she does not receive them to let me know and I will be more than glad to refax them. She voiced understanding.

## 2021-04-17 NOTE — Telephone Encounter (Signed)
Butch Penny returning call. She states the numbers in the voicemail are not correct.

## 2021-04-17 NOTE — Telephone Encounter (Signed)
Attempted to reach Butch Penny; let her a message informing her that we do not complete and fax back forms. I informed her that I would be more than glad to refax over our clearance note from when the patient was last see. I advised a call back if she has any questions or does not receive the fax.   Office note refaxed.

## 2021-04-17 NOTE — Telephone Encounter (Signed)
[facility] calling in to check the status of clearance.. Form that was faxed over has not been sent back and the practice is needing it as soon as possible, please advise

## 2021-04-17 NOTE — Progress Notes (Signed)
Remote ICD transmission.   

## 2021-04-17 NOTE — Telephone Encounter (Signed)
Sande Rives, PA-C completed the patient's preoperative cardiac evaluation during her last office visit.  Please refax her note from her last office visit to the requesting office.  Thank you.  Jossie Ng. Quinita Kostelecky NP-C    04/17/2021, 11:40 AM [place] [address] [place] [address] [phone] Fax [phone]

## 2021-04-19 ENCOUNTER — Ambulatory Visit (HOSPITAL_COMMUNITY)
Admission: RE | Disposition: A | Discharge status: Home / Self Care | Payer: Self-pay | Source: Home / Self Care | Attending: Orthopedic Surgery

## 2021-04-19 ENCOUNTER — Encounter (HOSPITAL_COMMUNITY): Payer: Self-pay | Admitting: Orthopedic Surgery

## 2021-04-19 ENCOUNTER — Ambulatory Visit (HOSPITAL_COMMUNITY): Payer: Medicare Other | Admitting: Vascular Surgery

## 2021-04-19 ENCOUNTER — Ambulatory Visit (HOSPITAL_COMMUNITY)
Admission: RE | Admit: 2021-04-19 | Discharge: 2021-04-19 | Disposition: A | Discharge status: Home / Self Care | Payer: Medicare Other | Attending: Orthopedic Surgery | Admitting: Orthopedic Surgery

## 2021-04-19 ENCOUNTER — Ambulatory Visit (HOSPITAL_COMMUNITY): Payer: Medicare Other

## 2021-04-19 DIAGNOSIS — I5042 Chronic combined systolic (congestive) and diastolic (congestive) heart failure: Secondary | ICD-10-CM | POA: Insufficient documentation

## 2021-04-19 DIAGNOSIS — Z981 Arthrodesis status: Secondary | ICD-10-CM | POA: Insufficient documentation

## 2021-04-19 DIAGNOSIS — I252 Old myocardial infarction: Secondary | ICD-10-CM | POA: Insufficient documentation

## 2021-04-19 DIAGNOSIS — Z833 Family history of diabetes mellitus: Secondary | ICD-10-CM | POA: Diagnosis not present

## 2021-04-19 DIAGNOSIS — Z888 Allergy status to other drugs, medicaments and biological substances status: Secondary | ICD-10-CM | POA: Diagnosis not present

## 2021-04-19 DIAGNOSIS — Z419 Encounter for procedure for purposes other than remedying health state, unspecified: Secondary | ICD-10-CM

## 2021-04-19 DIAGNOSIS — Z7901 Long term (current) use of anticoagulants: Secondary | ICD-10-CM | POA: Diagnosis not present

## 2021-04-19 DIAGNOSIS — M5000 Cervical disc disorder with myelopathy, unspecified cervical region: Secondary | ICD-10-CM | POA: Diagnosis not present

## 2021-04-19 DIAGNOSIS — Z880 Allergy status to penicillin: Secondary | ICD-10-CM | POA: Insufficient documentation

## 2021-04-19 DIAGNOSIS — I110 Hypertensive heart disease with heart failure: Secondary | ICD-10-CM | POA: Insufficient documentation

## 2021-04-19 DIAGNOSIS — M9961 Osseous and subluxation stenosis of intervertebral foramina of cervical region: Secondary | ICD-10-CM | POA: Diagnosis not present

## 2021-04-19 DIAGNOSIS — M4322 Fusion of spine, cervical region: Secondary | ICD-10-CM | POA: Diagnosis not present

## 2021-04-19 DIAGNOSIS — M5412 Radiculopathy, cervical region: Secondary | ICD-10-CM | POA: Diagnosis not present

## 2021-04-19 DIAGNOSIS — Z7984 Long term (current) use of oral hypoglycemic drugs: Secondary | ICD-10-CM | POA: Insufficient documentation

## 2021-04-19 DIAGNOSIS — M5010 Cervical disc disorder with radiculopathy, unspecified cervical region: Secondary | ICD-10-CM | POA: Diagnosis not present

## 2021-04-19 DIAGNOSIS — Z87891 Personal history of nicotine dependence: Secondary | ICD-10-CM | POA: Diagnosis not present

## 2021-04-19 DIAGNOSIS — I428 Other cardiomyopathies: Secondary | ICD-10-CM | POA: Diagnosis not present

## 2021-04-19 DIAGNOSIS — Z95810 Presence of automatic (implantable) cardiac defibrillator: Secondary | ICD-10-CM | POA: Insufficient documentation

## 2021-04-19 DIAGNOSIS — E119 Type 2 diabetes mellitus without complications: Secondary | ICD-10-CM | POA: Diagnosis not present

## 2021-04-19 DIAGNOSIS — M4802 Spinal stenosis, cervical region: Secondary | ICD-10-CM | POA: Diagnosis not present

## 2021-04-19 DIAGNOSIS — Z79899 Other long term (current) drug therapy: Secondary | ICD-10-CM | POA: Diagnosis not present

## 2021-04-19 HISTORY — PX: ANTERIOR CERVICAL DECOMP/DISCECTOMY FUSION: SHX1161

## 2021-04-19 LAB — GLUCOSE, CAPILLARY
Glucose-Capillary: 138 mg/dL — ABNORMAL HIGH (ref 70–99)
Glucose-Capillary: 115 mg/dL — ABNORMAL HIGH (ref 70–99)

## 2021-04-19 LAB — PROTIME-INR
INR: 1 (ref 0.8–1.2)
Prothrombin Time: 12.6 seconds (ref 11.4–15.2)

## 2021-04-19 LAB — APTT: aPTT: 24 seconds (ref 24–36)

## 2021-04-19 SURGERY — ANTERIOR CERVICAL DECOMPRESSION/DISCECTOMY FUSION 1 LEVEL
Anesthesia: General

## 2021-04-19 MED ORDER — METHOCARBAMOL 500 MG PO TABS: 500.0000 mg | ORAL_TABLET | Freq: Four times a day (QID) | ORAL | 0 refills | Status: DC | PRN

## 2021-04-19 MED ORDER — LACTATED RINGERS IV SOLN: INTRAVENOUS | Status: DC

## 2021-04-19 MED ORDER — FENTANYL CITRATE (PF) 250 MCG/5ML IJ SOLN
INTRAMUSCULAR | Status: AC
  Filled 2021-04-19: qty 5

## 2021-04-19 MED ORDER — BUPIVACAINE-EPINEPHRINE 0.25% -1:200000 IJ SOLN
INTRAMUSCULAR | Status: DC | PRN
  Administered 2021-04-19: 7 mL

## 2021-04-19 MED ORDER — ONDANSETRON HCL 4 MG/2ML IJ SOLN: 4.0000 mg | Freq: Once | INTRAMUSCULAR | Status: DC | PRN

## 2021-04-19 MED ORDER — FENTANYL CITRATE (PF) 100 MCG/2ML IJ SOLN
INTRAMUSCULAR | Status: AC
  Filled 2021-04-19: qty 2

## 2021-04-19 MED ORDER — CHLORHEXIDINE GLUCONATE 0.12 % MT SOLN
15.0000 mL | Freq: Once | OROMUCOSAL | Status: AC
  Administered 2021-04-19: 15 mL via OROMUCOSAL
  Filled 2021-04-19: qty 15

## 2021-04-19 MED ORDER — HEMOSTATIC AGENTS (NO CHARGE) OPTIME
TOPICAL | Status: DC | PRN
  Administered 2021-04-19: 1 via TOPICAL

## 2021-04-19 MED ORDER — MIDAZOLAM HCL 2 MG/2ML IJ SOLN
INTRAMUSCULAR | Status: AC
  Filled 2021-04-19: qty 2

## 2021-04-19 MED ORDER — SUGAMMADEX SODIUM 200 MG/2ML IV SOLN
INTRAVENOUS | Status: DC | PRN
Start: 2021-04-19 — End: 2021-04-19
  Administered 2021-04-19: 200 mg via INTRAVENOUS

## 2021-04-19 MED ORDER — ROCURONIUM BROMIDE 10 MG/ML (PF) SYRINGE
PREFILLED_SYRINGE | INTRAVENOUS | Status: DC | PRN
Start: 2021-04-19 — End: 2021-04-19
  Administered 2021-04-19: 20 mg via INTRAVENOUS
  Administered 2021-04-19: 60 mg via INTRAVENOUS

## 2021-04-19 MED ORDER — HYDROCODONE-ACETAMINOPHEN 5-325 MG PO TABS: 1.0000 | ORAL_TABLET | Freq: Four times a day (QID) | ORAL | 0 refills | Status: DC | PRN

## 2021-04-19 MED ORDER — MIDAZOLAM HCL 2 MG/2ML IJ SOLN
INTRAMUSCULAR | Status: DC | PRN
  Administered 2021-04-19: 2 mg via INTRAVENOUS

## 2021-04-19 MED ORDER — VANCOMYCIN HCL IN DEXTROSE 1-5 GM/200ML-% IV SOLN
1000.0000 mg | INTRAVENOUS | Status: AC
  Administered 2021-04-19: 1000 mg via INTRAVENOUS
  Filled 2021-04-19: qty 200

## 2021-04-19 MED ORDER — ACETAMINOPHEN 325 MG PO TABS: 325.0000 mg | ORAL_TABLET | ORAL | Status: DC | PRN

## 2021-04-19 MED ORDER — BUPIVACAINE-EPINEPHRINE (PF) 0.25% -1:200000 IJ SOLN
INTRAMUSCULAR | Status: AC
  Filled 2021-04-19: qty 30

## 2021-04-19 MED ORDER — LIDOCAINE 2% (20 MG/ML) 5 ML SYRINGE
INTRAMUSCULAR | Status: AC
  Filled 2021-04-19: qty 5

## 2021-04-19 MED ORDER — POVIDONE-IODINE 7.5 % EX SOLN: Freq: Once | CUTANEOUS | Status: DC

## 2021-04-19 MED ORDER — LIDOCAINE 2% (20 MG/ML) 5 ML SYRINGE
INTRAMUSCULAR | Status: DC | PRN
  Administered 2021-04-19: 100 mg via INTRAVENOUS

## 2021-04-19 MED ORDER — PROPOFOL 10 MG/ML IV BOLUS
INTRAVENOUS | Status: AC
  Filled 2021-04-19: qty 20

## 2021-04-19 MED ORDER — PROPOFOL 10 MG/ML IV BOLUS
INTRAVENOUS | Status: DC | PRN
  Administered 2021-04-19: 110 mg via INTRAVENOUS

## 2021-04-19 MED ORDER — OXYCODONE HCL 5 MG PO TABS: 5.0000 mg | ORAL_TABLET | Freq: Once | ORAL | Status: DC | PRN

## 2021-04-19 MED ORDER — MEPERIDINE HCL 25 MG/ML IJ SOLN: 6.2500 mg | INTRAMUSCULAR | Status: DC | PRN

## 2021-04-19 MED ORDER — THROMBIN (RECOMBINANT) 20000 UNITS EX SOLR
CUTANEOUS | Status: AC
  Filled 2021-04-19: qty 20000

## 2021-04-19 MED ORDER — ORAL CARE MOUTH RINSE: 15.0000 mL | Freq: Once | OROMUCOSAL | Status: AC

## 2021-04-19 MED ORDER — THROMBIN 20000 UNITS EX SOLR
CUTANEOUS | Status: DC | PRN
  Administered 2021-04-19: 20000 [IU] via TOPICAL

## 2021-04-19 MED ORDER — ONDANSETRON HCL 4 MG/2ML IJ SOLN
INTRAMUSCULAR | Status: AC
  Filled 2021-04-19: qty 2

## 2021-04-19 MED ORDER — FENTANYL CITRATE (PF) 100 MCG/2ML IJ SOLN
25.0000 ug | INTRAMUSCULAR | Status: DC | PRN
  Administered 2021-04-19: 50 ug via INTRAVENOUS
  Administered 2021-04-19: 25 ug via INTRAVENOUS

## 2021-04-19 MED ORDER — 0.9 % SODIUM CHLORIDE (POUR BTL) OPTIME
TOPICAL | Status: DC | PRN
  Administered 2021-04-19: 1000 mL

## 2021-04-19 MED ORDER — FENTANYL CITRATE (PF) 250 MCG/5ML IJ SOLN
INTRAMUSCULAR | Status: DC | PRN
  Administered 2021-04-19 (×2): 50 ug via INTRAVENOUS

## 2021-04-19 MED ORDER — PHENYLEPHRINE HCL-NACL 10-0.9 MG/250ML-% IV SOLN
INTRAVENOUS | Status: DC | PRN
  Administered 2021-04-19: 80 ug/min via INTRAVENOUS

## 2021-04-19 MED ORDER — OXYCODONE HCL 5 MG/5ML PO SOLN: 5.0000 mg | Freq: Once | ORAL | Status: DC | PRN

## 2021-04-19 MED ORDER — ONDANSETRON HCL 4 MG/2ML IJ SOLN
INTRAMUSCULAR | Status: DC | PRN
  Administered 2021-04-19: 4 mg via INTRAVENOUS

## 2021-04-19 MED ORDER — ACETAMINOPHEN 160 MG/5ML PO SOLN: 325.0000 mg | ORAL | Status: DC | PRN

## 2021-04-19 SURGICAL SUPPLY — 64 items
BAG COUNTER SPONGE SURGICOUNT (BAG) ×2 IMPLANT
BENZOIN TINCTURE PRP APPL 2/3 (GAUZE/BANDAGES/DRESSINGS) ×2 IMPLANT
BIT DRILL NEURO 2X3.1 SFT TUCH (MISCELLANEOUS) ×1 IMPLANT
BIT DRILL SKYLINE 12MM (BIT) ×1 IMPLANT
BLADE CLIPPER SURG (BLADE) ×2 IMPLANT
BLADE SURG 15 STRL LF DISP TIS (BLADE) ×1 IMPLANT
BLADE SURG 15 STRL SS (BLADE) ×1
BONE VIVIGEN FORMABLE 1.3CC (Bone Implant) ×2 IMPLANT
CARTRIDGE OIL MAESTRO DRILL (MISCELLANEOUS) ×1 IMPLANT
CLSR STERI-STRIP ANTIMIC 1/2X4 (GAUZE/BANDAGES/DRESSINGS) ×2 IMPLANT
CORD BIPOLAR FORCEPS 12FT (ELECTRODE) ×2 IMPLANT
COVER SURGICAL LIGHT HANDLE (MISCELLANEOUS) ×2 IMPLANT
DIFFUSER DRILL AIR PNEUMATIC (MISCELLANEOUS) ×2 IMPLANT
DRAPE C-ARM 42X72 X-RAY (DRAPES) ×2 IMPLANT
DRAPE POUCH INSTRU U-SHP 10X18 (DRAPES) ×2 IMPLANT
DRAPE SURG 17X23 STRL (DRAPES) ×6 IMPLANT
DRILL BIT SKYLINE 12MM (BIT) ×2
DRILL NEURO 2X3.1 SOFT TOUCH (MISCELLANEOUS) ×2
DURAPREP 26ML APPLICATOR (WOUND CARE) ×2 IMPLANT
ELECT COATED BLADE 2.86 ST (ELECTRODE) ×2 IMPLANT
ELECT REM PT RETURN 9FT ADLT (ELECTROSURGICAL) ×2
ELECTRODE REM PT RTRN 9FT ADLT (ELECTROSURGICAL) ×1 IMPLANT
GAUZE 4X4 16PLY ~~LOC~~+RFID DBL (SPONGE) ×2 IMPLANT
GAUZE SPONGE 4X4 12PLY STRL (GAUZE/BANDAGES/DRESSINGS) ×2 IMPLANT
GLOVE SRG 8 PF TXTR STRL LF DI (GLOVE) ×1 IMPLANT
GLOVE SURG ENC MOIS LTX SZ7 (GLOVE) ×2 IMPLANT
GLOVE SURG ENC MOIS LTX SZ8 (GLOVE) ×2 IMPLANT
GLOVE SURG UNDER POLY LF SZ7 (GLOVE) ×4 IMPLANT
GLOVE SURG UNDER POLY LF SZ8 (GLOVE) ×2
GOWN STRL REUS W/ TWL LRG LVL3 (GOWN DISPOSABLE) ×1 IMPLANT
GOWN STRL REUS W/ TWL XL LVL3 (GOWN DISPOSABLE) ×1 IMPLANT
GOWN STRL REUS W/TWL LRG LVL3 (GOWN DISPOSABLE) ×2
GOWN STRL REUS W/TWL XL LVL3 (GOWN DISPOSABLE) ×2
INTERLOCK LRDTC CRVCL VBR 6MM (Bone Implant) ×1 IMPLANT
IV CATH 14GX2 1/4 (CATHETERS) ×2 IMPLANT
KIT BASIN OR (CUSTOM PROCEDURE TRAY) ×2 IMPLANT
KIT TURNOVER KIT B (KITS) ×2 IMPLANT
LORDOTIC CERVICAL VBR 6MM SM (Bone Implant) ×2 IMPLANT
NEEDLE PRECISIONGLIDE 27X1.5 (NEEDLE) ×2 IMPLANT
NEEDLE SPNL 20GX3.5 QUINCKE YW (NEEDLE) ×2 IMPLANT
NS IRRIG 1000ML POUR BTL (IV SOLUTION) ×2 IMPLANT
OIL CARTRIDGE MAESTRO DRILL (MISCELLANEOUS) ×2
PACK ORTHO CERVICAL (CUSTOM PROCEDURE TRAY) ×2 IMPLANT
PAD ARMBOARD 7.5X6 YLW CONV (MISCELLANEOUS) ×4 IMPLANT
PATTIES SURGICAL .5 X.5 (GAUZE/BANDAGES/DRESSINGS) ×2 IMPLANT
PIN DISTRACTION 14 (PIN) ×4 IMPLANT
PLATE SKYLINE 12MM (Plate) ×2 IMPLANT
POSITIONER HEAD DONUT 9IN (MISCELLANEOUS) ×2 IMPLANT
RASP 3.0MM (RASP) ×2 IMPLANT
SCREW SKYLINE VARIABLE LG (Screw) ×8 IMPLANT
SPONGE INTESTINAL PEANUT (DISPOSABLE) ×2 IMPLANT
SPONGE SURGIFOAM ABS GEL 100 (HEMOSTASIS) ×2 IMPLANT
STRIP CLOSURE SKIN 1/2X4 (GAUZE/BANDAGES/DRESSINGS) ×2 IMPLANT
SUT MNCRL AB 4-0 PS2 18 (SUTURE) ×2 IMPLANT
SUT VIC AB 2-0 CT2 18 VCP726D (SUTURE) ×2 IMPLANT
SYR BULB IRRIG 60ML STRL (SYRINGE) ×2 IMPLANT
SYR CONTROL 10ML LL (SYRINGE) ×4 IMPLANT
TAPE CLOTH 4X10 WHT NS (GAUZE/BANDAGES/DRESSINGS) ×2 IMPLANT
TAPE CLOTH SURG 4X10 WHT LF (GAUZE/BANDAGES/DRESSINGS) ×2 IMPLANT
TAPE UMBILICAL COTTON 1/8X30 (MISCELLANEOUS) ×2 IMPLANT
TOWEL GREEN STERILE (TOWEL DISPOSABLE) ×2 IMPLANT
TOWEL GREEN STERILE FF (TOWEL DISPOSABLE) ×2 IMPLANT
WATER STERILE IRR 1000ML POUR (IV SOLUTION) ×2 IMPLANT
YANKAUER SUCT BULB TIP NO VENT (SUCTIONS) ×2 IMPLANT

## 2021-04-19 NOTE — Op Note (Signed)
PATIENT NAME: Jonathan Dickson   MEDICAL RECORD NO.:   [id number]    DATE OF BIRTH: January 11, 1956   DATE OF PROCEDURE: 04/19/2021                               OPERATIVE REPORT     PREOPERATIVE DIAGNOSES: 1. Left-sided cervical radiculopathy. 2. 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 Status post a previous C4-C7 cervical fusion with anterior instrumentation   POSTOPERATIVE DIAGNOSES: 1. Left-sided cervical radiculopathy. 2. 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 Status post a previous C4-C7 cervical fusion with anterior instrumentation   PROCEDURE: 1. Anterior cervical decompression and fusion C3/4 2. Placement of anterior instrumentation, C3/4 3. Insertion of interbody device x1 (33mm Titan intervertebral spacer). 4. Intraoperative use of fluoroscopy. 5. Use of morselized allograft - ViviGen. 6. Complex removal of upper aspect of previous anterior instrumentation spanning the C4-5 segment   SURGEON:  [name], MD   ASSISTANT:  Pricilla Holm, PA-C.   ANESTHESIA:  General endotracheal anesthesia.   COMPLICATIONS:  None.   DISPOSITION:  Stable.   ESTIMATED BLOOD LOSS:  Minimal.   INDICATIONS FOR SURGERY:  Briefly, Jonathan Dickson is a pleasant 65 -year- old male, who did present to me with severe pain in his neck and left shoulder.   His symptoms were consistent with the findings noted above on his cervical MRI. Given the ongoing rather debilitating pain and lack of improvement with appropriate treatment measures, we did discuss proceeding with the procedure noted above.  The patient was fully aware of the risks and limitations of surgery as outlined in my preoperative note.   OPERATIVE DETAILS:  On 04/19/2021, the patient was brought to surgery and general endotracheal anesthesia was administered.  The patient was placed supine on the hospital bed. The neck was gently extended.  All bony prominences were meticulously padded.  The neck was prepped and draped in the usual sterile fashion.  At this point, I  did make a left-sided transverse incision.  The platysma was incised.  A Smith-Robinson approach was used and the anterior spine was identified. A self-retaining retractor was placed.  I then subperiosteally exposed the vertebral bodies from C3-C5.  The previously placed hardware was noted. I did not feel that there was adequate space above the current hardware to place a new anterior cervical plate above it.  Therefore, I did use a high-speed bur to amputate the upper aspect of the cervical plate, at the level of the C4-5 intervertebral space.  This was a very meticulous portion of the procedure, as I did need to ensure safe retraction of the esophagus, and the carotid artery immediately adjacent to the plate.  I was however able to bur through the anterior plate, after which point the upper aspect of the plate and the screws secured into C4 were then removed.  I then turned my attention to the C3-4 intervertebral space.  Caspar pins were placed into the C3 and C4 vertebral bodies and distraction was then applied.  Thorough and complete C3-4 intervertebral discectomy was performed, as well as a thorough left-sided neuroforaminal decompression.  This was done using a series of Kerrison punches as well as a high-speed 2 mm bur. The endplates were then prepared and the appropriate-sized intervertebral spacer was then packed with ViviGen and tamped into position in the usual fashion. The Caspar pins  then were removed and bone wax was placed in their place.  The appropriate-sized anterior cervical plate  was placed over the anterior spine. 12 mm variable angle screws were placed, 2 in each vertebral body from C3-C4 for a total of 4 vertebral body screws.  The screws were then locked to the plate using the Cam locking mechanism.  I was very pleased with the final fluoroscopic images.  The wound was then irrigated.  The wound was then explored for any undue bleeding and there was no bleeding noted. The  wound was then closed in layers using 2-0 Vicryl, followed by 4-0 Monocryl.  Benzoin and Steri-Strips were applied, followed by sterile dressing.  All instrument counts were correct at the termination of the procedure.   Of note, Pricilla Holm, PA-C, was my assistant throughout surgery, and did aid in retraction, placement of the hardware, suctioning, and closure from start to finish.         [name], MD

## 2021-04-19 NOTE — H&P (Signed)
PREOPERATIVE H&P  Chief Complaint: Left shoulder pain  HPI: Jonathan Dickson is a 65 y.o. male who presents with ongoing pain in the left shoulder  MRI reveals stenosis at C3/4, the level above the patient's previous C4-C7 fusion  Patient has failed multiple forms of conservative care and continues to have pain (see office notes for additional details regarding the patient's full course of treatment)  Past Medical History:  Diagnosis Date   Abnormal TSH 02/2016   AICD (automatic cardioverter/defibrillator) present    Anxiety    Cervical disc disease    Chronic combined systolic and diastolic CHF (congestive heart failure) ([place])    a. 01/2016 Echo: EF 25%, inf AK, diffuse sev HK, Gr1 DD, mild MR.   Depression    Diabetes mellitus without complication ([place])    Dyspnea    Dysrhythmia    History of hiatal hernia    Hyperglycemia    Hypertension    Hypertensive heart disease    Myocardial infarction [facility])    ??  maybe   NICM (nonischemic cardiomyopathy) ([place])    a. 01/2016 Echo: EF 25%, inf AK, diffuse sev HK, Gr1 DD, mild MR; b. 01/2016 MV: EF 22%, no isch/infarct;  c. 02/2016 Cath: Nl cors, EF 35-45%.   Pneumonia    Pre-diabetes    Seizures ([place])    with penicillin   Past Surgical History:  Procedure Laterality Date   ANTERIOR CERVICAL DECOMP/DISCECTOMY FUSION N/A 07/10/2016   Procedure: ANTERIOR CERVICAL DECOMPRESSION FUSION CERVICAL 4-5, CERVICAL 5-6, CERVICAL 6-7 WITH INSTRUMENTATION AND ALLOGRAFT;  Surgeon: [name], MD;  Location: [place];  Service: Orthopedics;  Laterality: N/A;   CARDIAC CATHETERIZATION N/A 03/11/2016   Procedure: Left Heart Cath and Coronary Angiography;  Surgeon: [name], MD;  Location: [place] [facility];  Service: Cardiovascular;  Laterality: N/A;   ICD IMPLANT N/A 06/17/2018   Procedure: ICD IMPLANT;  Surgeon: [name], MD;  Location: [place] [facility];  Service: Cardiovascular;  Laterality: N/A;   Thumb surgery Right     VENTRAL HERNIA REPAIR N/A 10/25/2019   Procedure: PRIMARY VENTRAL HERNIA REPAIR;  Surgeon: [name], MD;  Location: [facility];  Service: General;  Laterality: N/A;   Social History   Socioeconomic History   Marital status: Legally Separated    Spouse name: Not on file   Number of children: 3   Years of education: Not on file   Highest education level: Not on file  Occupational History   Not on file  Tobacco Use   Smoking status: Former    Packs/day: 1.50    Years: 18.00    Pack years: 27.00    Types: Cigarettes    Quit date: 03/15/2003    Years since quitting: 18.1   Smokeless tobacco: Never  Vaping Use   Vaping Use: Never used  Substance and Sexual Activity   Alcohol use: No    Comment: none since 2004   Drug use: No    Comment: former  none since 2004   Sexual activity: Not Currently  Other Topics Concern   Not on file  Social History Narrative   Not on file   Social Determinants of Health   Financial Resource Strain: Not on file  Food Insecurity: Not on file  Transportation Needs: Not on file  Physical Activity: Not on file  Stress: Not on file  Social Connections: Not on file   Family History  Problem Relation Age of Onset  Diabetes Mother    Diabetes Sister    Clotting disorder Neg Hx    Lung disease Neg Hx    Allergies  Allergen Reactions   Peanut-Containing Drug Products Swelling   Penicillins Other (See Comments)    CONVULSIONS Did it involve swelling of the face/tongue/throat, SOB, or low BP? No Did it involve sudden or severe rash/hives, skin peeling, or any reaction on the inside of your mouth or nose? No Did you need to seek medical attention at a hospital or doctor's office? Yes When did it last happen?      25 years If all above answers are "NO", may proceed with cephalosporin use.    Decadron [Dexamethasone] Itching   Prior to Admission medications   Medication Sig Start Date End Date Taking? Authorizing Provider  acetaminophen  (TYLENOL) 500 MG tablet Take 1,000 mg by mouth every 6 (six) hours as needed for moderate pain or headache.    [provider]  amitriptyline (ELAVIL) 25 MG tablet TAKE 1 TABLET(25 MG) BY MOUTH AT BEDTIME AS NEEDED FOR SLEEP Patient taking differently: Take 25 mg by mouth at bedtime as needed for sleep. TAKE 1 TABLET(25 MG) BY MOUTH AT BEDTIME AS NEEDED FOR SLEEP 06/15/20   Ladell Pier, MD  Aromatic Inhalants (VICKS VAPOINHALER) INHA Inhale 1 Dose into the lungs daily as needed (congestion).    [provider]  atorvastatin (LIPITOR) 80 MG tablet TAKE 1 TABLET BY MOUTH EVERY DAY**SCHEDULE OFFICE VISIT Patient taking differently: Take 80 mg by mouth daily. 01/08/21   Lorretta Harp, MD  carvedilol (COREG) 25 MG tablet TAKE 1 TABLET(25 MG) BY MOUTH TWICE DAILY WITH A MEAL Patient taking differently: Take 25 mg by mouth 2 (two) times daily with a meal. 10/25/20   Lorretta Harp, MD  Chlorphen-Phenyleph-ASA (ALKA-SELTZER PLUS COLD PO) Take 1-2 tablets by mouth 2 (two) times daily as needed (cold symptoms).     [provider]  Cholecalciferol (VITAMIN D) 50 MCG (2000 UT) tablet Take 2,000 Units by mouth daily.    [provider]  dapagliflozin propanediol (FARXIGA) 10 MG TABS tablet Take 1 tablet (10 mg total) by mouth daily before breakfast. Needs appt for further refills 02/01/21   Bensimhon, Shaune Pascal, MD  diclofenac Sodium (VOLTAREN) 1 % GEL Apply 2 g topically 4 (four) times daily. Patient taking differently: Apply 2 g topically 4 (four) times daily as needed (pain). 02/04/20   Ladell Pier, MD  enoxaparin (LOVENOX) 80 MG/0.8ML injection Inject 0.8 mLs (80 mg total) into the skin every 12 (twelve) hours. 04/13/21   Lorretta Harp, MD  ENTRESTO 97-103 MG TAKE 1 TABLET BY MOUTH TWICE DAILY 06/01/20   Bensimhon, Shaune Pascal, MD  furosemide (LASIX) 40 MG tablet Take 40 mg by mouth daily as needed. For fluid (if weight gain more than 3 lbs in 1 day)    [provider]  isosorbide mononitrate (IMDUR) 30 MG 24 hr tablet TAKE 1 TABLET(30 MG) BY MOUTH AT BEDTIME Patient taking differently: Take 30 mg by mouth at bedtime. 02/09/21   Bensimhon, Shaune Pascal, MD  metFORMIN (GLUCOPHAGE) 500 MG tablet TAKE 1 TABLET BY MOUTH DAILY WITH BREAKFAST Patient taking differently: Take 500 mg by mouth daily with breakfast. 11/02/20   Ladell Pier, MD  spironolactone (ALDACTONE) 25 MG tablet TAKE 1/2 TABLET(12.5 MG) BY MOUTH DAILY Patient taking differently: Take 12.5 mg by mouth daily. 03/19/21   Bensimhon, Shaune Pascal, MD  Tafamidis 61 MG CAPS  TAKE 1 CAPSULE (61 MG) BY MOUTH DAILY. PLEASE DISCONTINUE VYNDAQEL. Patient taking differently: Take 61 mg by mouth daily. 10/04/20 10/04/21  Bensimhon, Shaune Pascal, MD  warfarin (COUMADIN) 5 MG tablet TAKE 1 TO 2 TABLETS BY MOUTH DAILY AS DIRECTED Patient taking differently: Take 7.5 mg by mouth daily at 4 PM. Taking 1 & 1/2 tablets = 7.5mg  daily 12/11/20   Lorretta Harp, MD     All other systems have been reviewed and were otherwise negative with the exception of those mentioned in the HPI and as above.  Physical Exam: There were no vitals filed for this visit.  There is no height or weight on file to calculate BMI.  General: Alert, no acute distress Cardiovascular: No pedal edema Respiratory: No cyanosis, no use of accessory musculature Skin: No lesions in the area of chief complaint Neurologic: Sensation intact distally Psychiatric: Patient is competent for consent with normal mood and affect Lymphatic: No axillary or cervical lymphadenopathy   Assessment/Plan: LEFT CERVICAL 4 RADICULOPATHY SECONDARY TO ADJACENT SEGMENT DEGENERATION AT THE LEVEL ABOVE THE PATIENT'S REVIOUS FUSION, RESULTING IN SEVERE LEFT-SIDED NEUROFORAMINAL STENOSIS Plan for Procedure(s): ANTERIOR CERVICAL DECOMPRESSION FUSION CERVICAL 3- CERVICAL 4 WITH INSTRUMENTATION AND ALLOGRAFT   Norva Karvonen, MD 04/19/2021 7:47 AM

## 2021-04-19 NOTE — Anesthesia Postprocedure Evaluation (Signed)
Anesthesia Post Note  Patient: Jonathan Dickson  Procedure(s) Performed: ANTERIOR CERVICAL DECOMPRESSION FUSION CERVICAL 3- CERVICAL 4 WITH INSTRUMENTATION AND ALLOGRAFT     Patient location during evaluation: PACU Anesthesia Type: General Level of consciousness: awake and alert Pain management: pain level controlled Vital Signs Assessment: post-procedure vital signs reviewed and stable Respiratory status: spontaneous breathing, nonlabored ventilation, respiratory function stable and patient connected to nasal cannula oxygen Cardiovascular status: blood pressure returned to baseline and stable Postop Assessment: no apparent nausea or vomiting Anesthetic complications: no   No notable events documented.  Last Vitals:  Vitals:   04/19/21 1433 04/19/21 1448  BP: (!) 133/92 130/82  Pulse: 69 70  Resp: 16 20  Temp: 36.7 C   SpO2: 93% 94%    Last Pain:  Vitals:   04/19/21 1433  TempSrc:   PainSc: 0-No pain                 [name]

## 2021-04-19 NOTE — Transfer of Care (Signed)
Immediate Anesthesia Transfer of Care Note  Patient: Jonathan Dickson  Procedure(s) Performed: ANTERIOR CERVICAL DECOMPRESSION FUSION CERVICAL 3- CERVICAL 4 WITH INSTRUMENTATION AND ALLOGRAFT  Patient Location: PACU  Anesthesia Type:General  Level of Consciousness: drowsy, patient cooperative and responds to stimulation  Airway & Oxygen Therapy: Patient Spontanous Breathing and Patient connected to face mask oxygen  Post-op Assessment: Report given to RN, Post -op Vital signs reviewed and stable and Patient moving all extremities X 4  Post vital signs: Reviewed and stable  Last Vitals:  Vitals Value Taken Time  BP 153/91 04/19/21 1348  Temp    Pulse 69 04/19/21 1351  Resp 26 04/19/21 1351  SpO2 94 % 04/19/21 1351  Vitals shown include unvalidated device data.  Last Pain:  Vitals:   04/19/21 1018  TempSrc: Oral  PainSc:       Patients Stated Pain Goal: 0 ([id number])  Complications: No notable events documented.

## 2021-04-19 NOTE — Anesthesia Procedure Notes (Signed)
Procedure Name: Intubation Date/Time: 04/19/2021 12:35 PM Performed by: Michele Rockers, CRNA Pre-anesthesia Checklist: Patient identified, Patient being monitored, Timeout performed, Emergency Drugs available and Suction available Patient Re-evaluated:Patient Re-evaluated prior to induction Oxygen Delivery Method: Circle System Utilized Preoxygenation: Pre-oxygenation with 100% oxygen Induction Type: IV induction Ventilation: Mask ventilation without difficulty Laryngoscope Size: Miller and 2 Grade View: Grade I Tube type: Oral Tube size: 8.0 mm Number of attempts: 1 Airway Equipment and Method: Stylet Placement Confirmation: ETT inserted through vocal cords under direct vision, positive ETCO2 and breath sounds checked- equal and bilateral Secured at: 22 cm Tube secured with: Tape Dental Injury: Teeth and Oropharynx as per pre-operative assessment

## 2021-04-20 ENCOUNTER — Other Ambulatory Visit (HOSPITAL_COMMUNITY): Payer: Self-pay

## 2021-04-20 ENCOUNTER — Encounter (HOSPITAL_COMMUNITY): Payer: Self-pay | Admitting: Orthopedic Surgery

## 2021-04-20 ENCOUNTER — Telehealth: Payer: Self-pay

## 2021-04-20 DIAGNOSIS — M4322 Fusion of spine, cervical region: Secondary | ICD-10-CM | POA: Diagnosis not present

## 2021-04-20 DIAGNOSIS — Z4789 Encounter for other orthopedic aftercare: Secondary | ICD-10-CM | POA: Diagnosis not present

## 2021-04-20 DIAGNOSIS — R2681 Unsteadiness on feet: Secondary | ICD-10-CM | POA: Diagnosis not present

## 2021-04-20 DIAGNOSIS — E119 Type 2 diabetes mellitus without complications: Secondary | ICD-10-CM | POA: Diagnosis not present

## 2021-04-20 DIAGNOSIS — Z7984 Long term (current) use of oral hypoglycemic drugs: Secondary | ICD-10-CM | POA: Diagnosis not present

## 2021-04-20 MED FILL — Thrombin (Recombinant) For Soln 20000 Unit: CUTANEOUS | Qty: 1 | Status: AC

## 2021-04-20 MED FILL — Tafamidis Cap 61 MG: ORAL | 30 days supply | Qty: 30 | Fill #4 | Status: AC

## 2021-04-20 NOTE — Telephone Encounter (Signed)
I spoke to the patient and clarified his Lovenox/Warfarin restart after his procedure.  He verbalized understanding.

## 2021-04-20 NOTE — Telephone Encounter (Signed)
New Message:      Pt says he need to talk to Legrand Como or somebody concerning his medicine please.

## 2021-04-24 ENCOUNTER — Other Ambulatory Visit (HOSPITAL_COMMUNITY): Payer: Self-pay

## 2021-04-25 ENCOUNTER — Ambulatory Visit (INDEPENDENT_AMBULATORY_CARE_PROVIDER_SITE_OTHER): Payer: Medicare Other

## 2021-04-25 ENCOUNTER — Other Ambulatory Visit (HOSPITAL_COMMUNITY): Payer: Self-pay | Admitting: Internal Medicine

## 2021-04-25 ENCOUNTER — Other Ambulatory Visit: Payer: Self-pay

## 2021-04-25 DIAGNOSIS — I2699 Other pulmonary embolism without acute cor pulmonale: Secondary | ICD-10-CM

## 2021-04-25 DIAGNOSIS — Z7901 Long term (current) use of anticoagulants: Secondary | ICD-10-CM | POA: Diagnosis not present

## 2021-04-25 LAB — POCT INR: INR: 2.1 (ref 2.0–3.0)

## 2021-04-25 NOTE — Patient Instructions (Signed)
continue taking 1.5 tablets daily.  INR 6 weeks

## 2021-04-26 ENCOUNTER — Other Ambulatory Visit: Payer: Self-pay | Admitting: Internal Medicine

## 2021-04-26 DIAGNOSIS — E119 Type 2 diabetes mellitus without complications: Secondary | ICD-10-CM

## 2021-04-26 NOTE — Telephone Encounter (Signed)
Requested Prescriptions  Pending Prescriptions Disp Refills  . metFORMIN (GLUCOPHAGE) 500 MG tablet [Pharmacy Med Name: METFORMIN 500MG TABLETS] 30 tablet 5    Sig: TAKE 1 TABLET BY MOUTH DAILY WITH BREAKFAST     Endocrinology:  Diabetes - Biguanides Passed - 04/26/2021  6:17 AM      Passed - Cr in normal range and within 360 days    Creat  Date Value Ref Range Status  03/21/2016 0.98 0.70 - 1.25 mg/dL Final    Comment:      For patients > or = 66 years of age: The upper reference limit for Creatinine is approximately 13% higher for people identified as African-American.      Creatinine, Ser  Date Value Ref Range Status  04/10/2021 1.24 0.61 - 1.24 mg/dL Final         Passed - HBA1C is between 0 and 7.9 and within 180 days    HbA1c, POC (controlled diabetic range)  Date Value Ref Range Status  10/30/2020 6.4 0.0 - 7.0 % Final   Hgb A1c MFr Bld  Date Value Ref Range Status  04/10/2021 7.4 (H) 4.8 - 5.6 % Final    Comment:    (NOTE) Pre diabetes:          5.7%-6.4%  Diabetes:              >6.4%  Glycemic control for   <7.0% adults with diabetes          Passed - AA eGFR in normal range and within 360 days    GFR, Est African American  Date Value Ref Range Status  10/04/2015 >89 >=60 mL/min Final   GFR calc Af Amer  Date Value Ref Range Status  07/21/2020 62 >59 mL/min/1.73 Final    Comment:    **In accordance with recommendations from the NKF-ASN Task force,**   [facility] is in the process of updating its eGFR calculation to the   2021 CKD-EPI creatinine equation that estimates kidney function   without a race variable.    GFR, Est Non African American  Date Value Ref Range Status  10/04/2015 >89 >=60 mL/min Final    Comment:      The estimated GFR is a calculation valid for adults (>=24 years old) that uses the CKD-EPI algorithm to adjust for age and sex. It is   not to be used for children, pregnant women, hospitalized patients,    patients on dialysis,  or with rapidly changing kidney function. According to the NKDEP, eGFR >89 is normal, 60-89 shows mild impairment, 30-59 shows moderate impairment, 15-29 shows severe impairment and <15 is ESRD.      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

## 2021-04-27 ENCOUNTER — Emergency Department (HOSPITAL_COMMUNITY)
Admission: EM | Admit: 2021-04-27 | Discharge: 2021-04-27 | Disposition: A | Discharge status: Home / Self Care | Payer: Medicare Other | Attending: Emergency Medicine | Admitting: Emergency Medicine

## 2021-04-27 ENCOUNTER — Other Ambulatory Visit: Payer: Self-pay

## 2021-04-27 DIAGNOSIS — M4322 Fusion of spine, cervical region: Secondary | ICD-10-CM | POA: Insufficient documentation

## 2021-04-27 DIAGNOSIS — T782XXA Anaphylactic shock, unspecified, initial encounter: Secondary | ICD-10-CM | POA: Diagnosis not present

## 2021-04-27 DIAGNOSIS — R221 Localized swelling, mass and lump, neck: Secondary | ICD-10-CM | POA: Diagnosis not present

## 2021-04-27 DIAGNOSIS — Z5321 Procedure and treatment not carried out due to patient leaving prior to being seen by health care provider: Secondary | ICD-10-CM | POA: Diagnosis not present

## 2021-04-27 DIAGNOSIS — R609 Edema, unspecified: Secondary | ICD-10-CM | POA: Diagnosis not present

## 2021-04-27 NOTE — ED Triage Notes (Signed)
Pt brough to ED with c/o of swelling to front of neck after having bone fusion to cervical spine 1 week ago.Reports swelling is beginning to negatively impact breathing.

## 2021-05-02 DIAGNOSIS — M5412 Radiculopathy, cervical region: Secondary | ICD-10-CM | POA: Diagnosis not present

## 2021-05-03 ENCOUNTER — Other Ambulatory Visit (HOSPITAL_COMMUNITY): Payer: Self-pay

## 2021-05-04 ENCOUNTER — Other Ambulatory Visit (HOSPITAL_COMMUNITY): Payer: Self-pay

## 2021-05-08 ENCOUNTER — Ambulatory Visit (HOSPITAL_BASED_OUTPATIENT_CLINIC_OR_DEPARTMENT_OTHER): Payer: Medicare Other | Admitting: Internal Medicine

## 2021-05-08 ENCOUNTER — Other Ambulatory Visit: Payer: Self-pay

## 2021-05-08 DIAGNOSIS — Z5321 Procedure and treatment not carried out due to patient leaving prior to being seen by health care provider: Secondary | ICD-10-CM

## 2021-05-09 ENCOUNTER — Ambulatory Visit (HOSPITAL_BASED_OUTPATIENT_CLINIC_OR_DEPARTMENT_OTHER)
Admission: RE | Admit: 2021-05-09 | Discharge: 2021-05-09 | Disposition: A | Discharge status: Home / Self Care | Payer: Medicare Other | Source: Ambulatory Visit | Attending: Internal Medicine | Admitting: Internal Medicine

## 2021-05-09 ENCOUNTER — Ambulatory Visit (HOSPITAL_COMMUNITY)
Admission: RE | Admit: 2021-05-09 | Discharge: 2021-05-09 | Disposition: A | Discharge status: Home / Self Care | Payer: Medicare Other | Source: Ambulatory Visit | Attending: Family Medicine | Admitting: Family Medicine

## 2021-05-09 ENCOUNTER — Encounter (HOSPITAL_COMMUNITY): Payer: Self-pay | Admitting: Internal Medicine

## 2021-05-09 VITALS — BP 92/62 | HR 80 | Wt 166.4 lb

## 2021-05-09 DIAGNOSIS — I5022 Chronic systolic (congestive) heart failure: Secondary | ICD-10-CM

## 2021-05-09 DIAGNOSIS — E119 Type 2 diabetes mellitus without complications: Secondary | ICD-10-CM | POA: Insufficient documentation

## 2021-05-09 DIAGNOSIS — R0602 Shortness of breath: Secondary | ICD-10-CM | POA: Insufficient documentation

## 2021-05-09 DIAGNOSIS — E785 Hyperlipidemia, unspecified: Secondary | ICD-10-CM | POA: Insufficient documentation

## 2021-05-09 DIAGNOSIS — I43 Cardiomyopathy in diseases classified elsewhere: Secondary | ICD-10-CM | POA: Diagnosis not present

## 2021-05-09 DIAGNOSIS — I5040 Unspecified combined systolic (congestive) and diastolic (congestive) heart failure: Secondary | ICD-10-CM | POA: Diagnosis not present

## 2021-05-09 DIAGNOSIS — E854 Organ-limited amyloidosis: Secondary | ICD-10-CM | POA: Diagnosis not present

## 2021-05-09 DIAGNOSIS — I2699 Other pulmonary embolism without acute cor pulmonale: Secondary | ICD-10-CM

## 2021-05-09 DIAGNOSIS — I110 Hypertensive heart disease with heart failure: Secondary | ICD-10-CM | POA: Insufficient documentation

## 2021-05-09 DIAGNOSIS — I429 Cardiomyopathy, unspecified: Secondary | ICD-10-CM | POA: Insufficient documentation

## 2021-05-09 DIAGNOSIS — R0600 Dyspnea, unspecified: Secondary | ICD-10-CM | POA: Insufficient documentation

## 2021-05-09 DIAGNOSIS — Z95810 Presence of automatic (implantable) cardiac defibrillator: Secondary | ICD-10-CM

## 2021-05-09 LAB — BASIC METABOLIC PANEL
Anion gap: 7 (ref 5–15)
BUN: 21 mg/dL (ref 8–23)
CO2: 21 mmol/L — ABNORMAL LOW (ref 22–32)
Calcium: 9.5 mg/dL (ref 8.9–10.3)
Chloride: 105 mmol/L (ref 98–111)
Creatinine, Ser: 1.44 mg/dL — ABNORMAL HIGH (ref 0.61–1.24)
GFR, Estimated: 54 mL/min — ABNORMAL LOW (ref >60)
Glucose, Bld: 114 mg/dL — ABNORMAL HIGH (ref 70–99)
Potassium: 5.4 mmol/L — ABNORMAL HIGH (ref 3.5–5.1)
Sodium: 133 mmol/L — ABNORMAL LOW (ref 135–145)

## 2021-05-09 LAB — ECHOCARDIOGRAM COMPLETE
Area-P 1/2: 3.17 cm2
Calc EF: 28.8 %
S' Lateral: 3.3 cm
Single Plane A2C EF: 32.6 %
Single Plane A4C EF: 33.8 %

## 2021-05-09 LAB — BRAIN NATRIURETIC PEPTIDE: B Natriuretic Peptide: 11.7 pg/mL (ref 0.0–100.0)

## 2021-05-09 NOTE — Progress Notes (Signed)
I called pt 05/09/21.  Pt states he left and rescheduled his appt because he was in pain from recent neck surgery and needed to get home to take a pain pill.

## 2021-05-09 NOTE — Progress Notes (Signed)
[facility]ANCED [facility] CLINIC Referring Physician: Dr [name]  Primary Care: Dr Wynetta Emery at [facility]  Primary Cardiologist: Dr [name] EP: Dr Curt Bears HF: Dr Haroldine Laws  HPI: Jonathan Dickson is a 65 y.o. 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 Subsequently underwent cath 6/17. EF 40-45%. Normal coronaries   Admitted to [facility] 8/19 with increased dyspnea. Diuresed with IV lasix. CTA performed and showed bilateral PE. Had AKI so diuretics held.  Placed on eliquis for PE.    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  Had u/s in 11/20 with acute LLE DVT. Switched from Eliquis to coumadin. Got COVID PNA over Christmas 12/20. CT chest showed no PE.  Today he returns for HF follow up. Doing fair since last visit a year ago. Recovering from esophageal surgery last month (bone fusion to cervical spine). Breathing has gotten worse over the last year. Can walk about 1/2 mile before getting SOB. Denies CP, dizziness, edema, or PND/Orthopnea. Appetite ok. Weight at home 166 pounds. Taking all medications. 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  ICD: No VT/VF. Impedance ok, had some fluid on him end of July around his surgery. Activity level ~1 hr/day. Personally reviewed  Cardiac Testing CTA 05/12/2018 --.large bilateral  PE   CPX 04/03/18 FVC 2.77 (83%)      FEV1 2.37 (91%)        FEV1/FVC 86 (109%)        MVV 76 (61%)  Resting HR: 83 Peak HR: 125   (80% age predicted max HR) BP rest: 144/84 BP peak: 168/72 Peak VO2: 23.1 (76% predicted peak VO2) VE/VCO2 slope:  41 OUES: 2.18 Peak RER: 0.93 VE/MVV:  88% O2pulse:  14   (100% predicted O2pulse)  PYP scan obtained which was positive (Visual grade 2. Quantitative 1.57).  cMRI 04/01/18 1. Mild LVE with mild LVH septal thickness 13 mm. Diffuse hypokinesis worse in the septum apex and lateral walls EF 28%  2.  Abnormal post gadolinium inversion recovery sequences with failure to null and uptake in the mid/subepicardial septum, inferior wall and lateral walls with some apical sparing  Findings consistent with amyloidosis or infiltrative cardiomyopathy  Echo 05/11/18 LVEF ~30% (continues to drop in setting of TTR amyloid). RV normal without evidence of acute strain. LE u/s without DVT.  ECHO 12/2017   Left ventricle: EF 30% to 35% (I felt closer to 40-45%). Mild LVH Diffuse hypokinesis. Doppler   parameters are consistent with abnormal left ventricular   relaxation (grade 1 diastolic dysfunction).  ECHO EF 08/2017  Left ventricle: EF 50% to 55%.   Review of systems complete and found to be negative unless listed in HPI.   Past Medical History:  Diagnosis Date   Abnormal TSH 02/2016   AICD (automatic cardioverter/defibrillator) present    Anxiety    Cervical disc disease    Chronic combined systolic and diastolic CHF (congestive heart failure) ([place])    a. 01/2016 Echo: EF 25%, inf AK, diffuse sev HK, Gr1 DD, mild MR.   Depression    Diabetes mellitus without complication ([place])    Dyspnea    Dysrhythmia    History of hiatal hernia    Hyperglycemia    Hypertension    Hypertensive heart disease    Myocardial infarction [facility])    ??  maybe   NICM (nonischemic cardiomyopathy) ([place])  a. 01/2016 Echo: EF 25%, inf AK, diffuse sev HK, Gr1 DD, mild MR; b. 01/2016 MV: EF 22%, no isch/infarct;  c. 02/2016 Cath: Nl cors, EF 35-45%.   Pneumonia    Pre-diabetes    Seizures (HCC)    with penicillin    Current Outpatient Medications  Medication Sig Dispense Refill   acetaminophen (TYLENOL) 500 MG tablet Take 1,000 mg by mouth every 6 (six) hours as needed for moderate pain or headache.     amitriptyline (ELAVIL) 25 MG tablet TAKE 1 TABLET(25 MG) BY MOUTH AT BEDTIME AS NEEDED FOR SLEEP 60 tablet 0   Aromatic Inhalants (VICKS VAPOINHALER) INHA Inhale 1 Dose into the lungs daily as needed (congestion).      atorvastatin (LIPITOR) 80 MG tablet TAKE 1 TABLET BY MOUTH EVERY DAY**SCHEDULE OFFICE VISIT 30 tablet 8   carvedilol (COREG) 25 MG tablet TAKE 1 TABLET(25 MG) BY MOUTH TWICE DAILY WITH A MEAL 180 tablet 2   Chlorphen-Phenyleph-ASA (ALKA-SELTZER PLUS COLD PO) Take 1-2 tablets by mouth 2 (two) times daily as needed (cold symptoms).      Cholecalciferol (VITAMIN D) 50 MCG (2000 UT) tablet Take 2,000 Units by mouth daily.     dapagliflozin propanediol (FARXIGA) 10 MG TABS tablet Take 1 tablet (10 mg total) by mouth daily. Must keep further appointments for refills 30 tablet 0   diclofenac Sodium (VOLTAREN) 1 % GEL Apply topically as needed.     ENTRESTO 97-103 MG TAKE 1 TABLET BY MOUTH TWICE DAILY 60 tablet 11   furosemide (LASIX) 40 MG tablet Take 40 mg by mouth daily as needed. For fluid (if weight gain more than 3 lbs in 1 day)     HYDROcodone-acetaminophen (NORCO/VICODIN) 5-325 MG tablet Take 1 tablet by mouth every 6 (six) hours as needed for moderate pain or severe pain. 20 tablet 0   isosorbide mononitrate (IMDUR) 30 MG 24 hr tablet TAKE 1 TABLET(30 MG) BY MOUTH AT BEDTIME 90 tablet 0   metFORMIN (GLUCOPHAGE) 500 MG tablet TAKE 1 TABLET BY MOUTH DAILY WITH BREAKFAST 30 tablet 5   methocarbamol (ROBAXIN) 500 MG tablet Take 1 tablet (500 mg total) by mouth every 6 (six) hours as needed for muscle spasms. 60 tablet 0   spironolactone (ALDACTONE) 25 MG tablet TAKE 1/2 TABLET(12.5 MG) BY MOUTH DAILY 15 tablet 11   Tafamidis 61 MG CAPS TAKE 1 CAPSULE (61 MG) BY MOUTH DAILY. PLEASE DISCONTINUE VYNDAQEL. 30 capsule 11   No current facility-administered medications for this encounter.   Allergies  Allergen Reactions   Peanut-Containing Drug Products Swelling   Penicillins Other (See Comments)    CONVULSIONS Did it involve swelling of the face/tongue/throat, SOB, or low BP? No Did it involve sudden or severe rash/hives, skin peeling, or any reaction on the inside of your mouth or nose? No Did  you need to seek medical attention at a hospital or doctor's office? Yes When did it last happen?      25 years If all above answers are "NO", may proceed with cephalosporin use.    Decadron [Dexamethasone] Itching   Social History   Socioeconomic History   Marital status: Legally Separated    Spouse name: Not on file   Number of children: 3   Years of education: Not on file   Highest education level: Not on file  Occupational History   Not on file  Tobacco Use   Smoking status: Former    Packs/day: 1.50    Years: 18.00  Pack years: 27.00    Types: Cigarettes    Quit date: 03/15/2003    Years since quitting: 18.1   Smokeless tobacco: Never  Vaping Use   Vaping Use: Never used  Substance and Sexual Activity   Alcohol use: No    Comment: none since 2004   Drug use: No    Comment: former  none since 2004   Sexual activity: Not Currently  Other Topics Concern   Not on file  Social History Narrative   Not on file   Social Determinants of Health   Financial Resource Strain: Not on file  Food Insecurity: Not on file  Transportation Needs: Not on file  Physical Activity: Not on file  Stress: Not on file  Social Connections: Not on file  Intimate Partner Violence: Not on file    Family History  Problem Relation Age of Onset   Diabetes Mother    Diabetes Sister    Clotting disorder Neg Hx    Lung disease Neg Hx    BP 92/62   Pulse 80   Wt 75.5 kg (166 lb 6.4 oz)   SpO2 99%   BMI 26.86 kg/m   Wt Readings from Last 3 Encounters:  05/09/21 75.5 kg (166 lb 6.4 oz)  04/27/21 77.1 kg (170 lb)  04/19/21 77.1 kg (170 lb)   PHYSICAL EXAM: General:  NAD. No resp difficulty HEENT: Normal, hoarse Neck: Supple. No JVD. Carotids 2+ bilat; no bruits. No lymphadenopathy or thryomegaly appreciated. Cor: PMI nondisplaced. Regular rate & rhythm. No rubs, gallops or murmurs. Lungs: Clear Abdomen: Soft, nontender, nondistended. No hepatosplenomegaly. No bruits or masses.  Good bowel sounds. Extremities: No cyanosis, clubbing, rash, edema Neuro: Alert & oriented x 3, cranial nerves grossly intact. Moves all 4 extremities w/o difficulty. Affect pleasant.  ASSESSMENT & PLAN: 1. Chronic Systolic Heart Failure, NIMC.  - ECHO EF dropped from 50-55%-->04/2018 25-30%  - Cath 6/17 normal cors. 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 No M-spike, normal IFE on myeloma panel.  - Repeat PRY 2/21. 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 - Now on tafamadis. - Worsening NYHA III-early IIIb symptoms. Volume status looks good today.  - Continue lasix 40 mg daily PRN. Has not needed any recently. - Continue Farxiga 10 mg daily. - Continue carvedilol 25 mg bid.  - Continue Entresto 97-103 mg bid.  - Continue spiro 12.5 mg daily. - Stop Imdur. - ICD interrogated personally today in Clinic. No VT/AF. Volume ok. Activity level ~1hr/day. - Could consider Barostim as he recovers from his neck surgery. - Check labs today  2.DMII - Stable. On metformin - On SGLT2i.  3. HTN - Blood pressure well controlled. He runs on low side, asymptomatic. - Continue current regimen.  4. PE - CTA 04/2018 with large bilateral PE. - Was on Eliquis. Now back on warfarin due to acute LLE DVT in 11/20. - INRs at [facility]. - CT chest 12/20 no PE. - Will get VQ to exclude chronic PE. - Needs lifelong AC.  Rafael Bihari, FNP  05/09/21  Patient seen and examined with the above-signed Advanced Practice Provider and/or Housestaff. I personally reviewed laboratory data, imaging studies and relevant notes. I independently examined the patient and formulated the important aspects of the plan. I have edited  the note to reflect any of my changes or salient points. I have personally discussed the plan with the patient  and/or family.  Functional status a bit worse today in setting of recent surgery. Volume status ok on exam and by Optivol interrogation. Echo reviewed personally and EF 30-35% range though windows suboptimal. Hydralazine recently stopped due to low BP. Will also stop Imdur.   General:  Weak appearing. No resp difficulty Hoarse voice HEENT: normal Neck: supple. no JVD. Carotids 2+ bilat; no bruits. No lymphadenopathy or thryomegaly appreciated. Cor: PMI nondisplaced. Regular rate & rhythm. No rubs, gallops or murmurs. Lungs: clear Abdomen: soft, nontender, nondistended. No hepatosplenomegaly. No bruits or masses. Good bowel sounds. Extremities: no cyanosis, clubbing, rash, edema Neuro: alert & orientedx3, cranial nerves grossly intact. moves all 4 extremities w/o difficulty. Affect pleasant  NYHA III-IIIB in setting of recent surgery. EF 30-35% on echo. Volume status ok. BP too soft to titrate meds. ICD interrogated personally in clinic as above. Stop Imdur. Continue current regimen otherwise. Labs today. May be candidate for vutrsiran when it is available.   [name], MD  11:27 AM

## 2021-05-09 NOTE — Progress Notes (Signed)
  Echocardiogram 2D Echocardiogram has been performed.  Jonathan Dickson R 05/09/2021, 10:00 AM

## 2021-05-09 NOTE — Patient Instructions (Signed)
Stop Imdur  Labs done today, your results will be available in MyChart, we will contact you for abnormal readings.  Please call our office in January 2023 to schedule your follow-up appointment  If you have any questions or concerns before your next appointment please send [place] a message through [place] or call our office at [phone].    TO LEAVE A MESSAGE FOR THE NURSE SELECT OPTION 2, PLEASE LEAVE A MESSAGE INCLUDING: YOUR NAME DATE OF BIRTH CALL BACK NUMBER REASON FOR CALL**this is important as we prioritize the call backs  YOU WILL RECEIVE A CALL BACK THE SAME DAY AS LONG AS YOU CALL BEFORE 4:00 PM  milAt the [facility], you and your health needs are our priority. As part of our continuing mission to provide you with exceptional heart care, we have created designated Provider Care Teams. These Care Teams include your primary Cardiologist (physician) and Advanced Practice Providers (APPs- Physician Assistants and Nurse Practitioners) who all work together to provide you with the care you need, when you need it.   You may see any of the following providers on your designated Care Team at your next follow up: Dr [name] Dr Loralie Champagne Dr Patrice Paradise, NP Lyda Jester, [place] Ginnie Smart Audry Riles, PharmD   Please be sure to bring in all your medications bottles to every appointment.

## 2021-05-10 ENCOUNTER — Other Ambulatory Visit (HOSPITAL_COMMUNITY): Payer: Self-pay | Admitting: Internal Medicine

## 2021-05-10 ENCOUNTER — Other Ambulatory Visit (HOSPITAL_COMMUNITY): Payer: Self-pay

## 2021-05-10 DIAGNOSIS — I5022 Chronic systolic (congestive) heart failure: Secondary | ICD-10-CM

## 2021-05-17 ENCOUNTER — Other Ambulatory Visit: Payer: Self-pay

## 2021-05-17 ENCOUNTER — Ambulatory Visit (HOSPITAL_COMMUNITY)
Admission: RE | Admit: 2021-05-17 | Discharge: 2021-05-17 | Disposition: A | Discharge status: Home / Self Care | Payer: Medicare Other | Source: Ambulatory Visit | Attending: Internal Medicine | Admitting: Internal Medicine

## 2021-05-17 DIAGNOSIS — I5022 Chronic systolic (congestive) heart failure: Secondary | ICD-10-CM | POA: Insufficient documentation

## 2021-05-17 LAB — BASIC METABOLIC PANEL
Anion gap: 7 (ref 5–15)
BUN: 20 mg/dL (ref 8–23)
CO2: 23 mmol/L (ref 22–32)
Calcium: 9.2 mg/dL (ref 8.9–10.3)
Chloride: 107 mmol/L (ref 98–111)
Creatinine, Ser: 1.31 mg/dL — ABNORMAL HIGH (ref 0.61–1.24)
GFR, Estimated: >60 mL/min (ref >60)
Glucose, Bld: 85 mg/dL (ref 70–99)
Potassium: 4.9 mmol/L (ref 3.5–5.1)
Sodium: 137 mmol/L (ref 135–145)

## 2021-05-17 LAB — BRAIN NATRIURETIC PEPTIDE: B Natriuretic Peptide: 18 pg/mL (ref 0.0–100.0)

## 2021-05-18 ENCOUNTER — Encounter (HOSPITAL_COMMUNITY): Payer: Self-pay

## 2021-05-18 ENCOUNTER — Other Ambulatory Visit: Payer: Self-pay

## 2021-05-18 ENCOUNTER — Ambulatory Visit (HOSPITAL_COMMUNITY)
Admission: EM | Admit: 2021-05-18 | Discharge: 2021-05-18 | Disposition: A | Discharge status: Home / Self Care | Payer: Medicare Other

## 2021-05-18 DIAGNOSIS — E860 Dehydration: Secondary | ICD-10-CM | POA: Diagnosis not present

## 2021-05-18 DIAGNOSIS — I959 Hypotension, unspecified: Secondary | ICD-10-CM

## 2021-05-18 NOTE — ED Triage Notes (Signed)
Pt in with c/o lightheadedness, headache and hypotension  Pt states his bp was 85/55 when he checked this morning

## 2021-05-18 NOTE — ED Provider Notes (Signed)
[place]    CSN: [id number] Arrival date & time: 05/18/21  [id number]      History   Chief Complaint Chief Complaint  Patient presents with   Headache   Hypotension    HPI Jonathan Dickson is a 65 y.o. male.   Patient here for evaluation of lightheadedness, headache, and low blood pressure.  Reports blood pressure was 85/55 at home.  BP 94/54 in office.  Reports mild headache and lightheadedness but denies any dizziness.  Patient with history of hypertension and was recently taken off hydralazine.  Patient did take Coreg dose this morning prior to checking blood pressure.  Reports checking blood pressures every morning and they are typically 100s/70s.  Patient also with history of congestive heart failure.  Denies any trauma, injury, or other precipitating event.  Denies any specific alleviating or aggravating factors.  Denies any fevers, chest pain, shortness of breath, N/V/D, numbness, tingling, weakness, abdominal pain.     The history is provided by the patient.  Headache  Past Medical History:  Diagnosis Date   Abnormal TSH 02/2016   AICD (automatic cardioverter/defibrillator) present    Anxiety    Cervical disc disease    Chronic combined systolic and diastolic CHF (congestive heart failure) ([place])    a. 01/2016 Echo: EF 25%, inf AK, diffuse sev HK, Gr1 DD, mild MR.   Depression    Diabetes mellitus without complication ([place])    Dyspnea    Dysrhythmia    History of hiatal hernia    Hyperglycemia    Hypertension    Hypertensive heart disease    Myocardial infarction [facility])    ??  maybe   NICM (nonischemic cardiomyopathy) ([place])    a. 01/2016 Echo: EF 25%, inf AK, diffuse sev HK, Gr1 DD, mild MR; b. 01/2016 MV: EF 22%, no isch/infarct;  c. 02/2016 Cath: Nl cors, EF 35-45%.   Pneumonia    Pre-diabetes    Seizures ([place])    with penicillin    Patient Active Problem List   Diagnosis Date Noted   Interstitial pulmonary fibrosis ([place]) 02/27/2021   Influenza vaccine  needed 10/30/2020   COVID-19 vaccine series completed 10/30/2020   Dyspnea on exertion 10/30/2020   ICD (implantable cardioverter-defibrillator) in place 05/16/2020   Blood clotting disorder ([place]) 03/08/2020   Long term (current) use of anticoagulants 08/06/2019   DVT (deep venous thrombosis) ([place]) 08/04/2019   Hypertensive retinopathy 06/30/2019   Pain due to onychomycosis of toenails of both feet 05/28/2019   Hyperlipidemia associated with type 2 diabetes mellitus ([place]) 05/15/2019   Type 2 diabetes mellitus without complication, without long-term current use of insulin ([place]) 05/13/2019   Cardiac amyloidosis ([place]) [id number]   Chronic systolic heart failure ([place]) 06/17/2018   Heart failure ([place]) 05/11/2018   Claudication in peripheral vascular disease ([place]) 12/31/2017   Sensorineural hearing loss (SNHL) of both ears 11/25/2017   Decreased hearing of both ears 11/10/2017   Cervical disc disease with myelopathy 07/10/2016   NICM (nonischemic cardiomyopathy) ([place]) 03/12/2016   Congestive dilated cardiomyopathy ([place]) 03/11/2016   Cervical radiculitis 04/19/2015   Essential hypertension 09/27/2014    Past Surgical History:  Procedure Laterality Date   ANTERIOR CERVICAL DECOMP/DISCECTOMY FUSION N/A 07/10/2016   Procedure: ANTERIOR CERVICAL DECOMPRESSION FUSION CERVICAL 4-5, CERVICAL 5-6, CERVICAL 6-7 WITH INSTRUMENTATION AND ALLOGRAFT;  Surgeon: [name], MD;  Location: [facility];  Service: Orthopedics;  Laterality: N/A;   ANTERIOR CERVICAL DECOMP/DISCECTOMY FUSION N/A 04/19/2021   Procedure: ANTERIOR CERVICAL DECOMPRESSION FUSION  CERVICAL 3- CERVICAL 4 WITH INSTRUMENTATION AND ALLOGRAFT;  Surgeon: [name], MD;  Location: [facility];  Service: Orthopedics;  Laterality: N/A;   CARDIAC CATHETERIZATION N/A 03/11/2016   Procedure: Left Heart Cath and Coronary Angiography;  Surgeon: [name], MD;  Location: [facility];  Service: Cardiovascular;  Laterality: N/A;   ICD IMPLANT  N/A 06/17/2018   Procedure: ICD IMPLANT;  Surgeon: [name], MD;  Location: [facility];  Service: Cardiovascular;  Laterality: N/A;   Thumb surgery Right    VENTRAL HERNIA REPAIR N/A 10/25/2019   Procedure: PRIMARY VENTRAL HERNIA REPAIR;  Surgeon: [name], MD;  Location: [facility];  Service: General;  Laterality: N/A;       Home Medications    Prior to Admission medications   Medication Sig Start Date End Date Taking? Authorizing Provider  acetaminophen (TYLENOL) 500 MG tablet Take 1,000 mg by mouth every 6 (six) hours as needed for moderate pain or headache.    [provider]  amitriptyline (ELAVIL) 25 MG tablet TAKE 1 TABLET(25 MG) BY MOUTH AT BEDTIME AS NEEDED FOR SLEEP 06/15/20   Ladell Pier, MD  Aromatic Inhalants (VICKS VAPOINHALER) INHA Inhale 1 Dose into the lungs daily as needed (congestion).    [provider]  atorvastatin (LIPITOR) 80 MG tablet TAKE 1 TABLET BY MOUTH EVERY DAY**SCHEDULE OFFICE VISIT 01/08/21   Lorretta Harp, MD  carvedilol (COREG) 25 MG tablet TAKE 1 TABLET(25 MG) BY MOUTH TWICE DAILY WITH A MEAL 10/25/20   Lorretta Harp, MD  Chlorphen-Phenyleph-ASA (ALKA-SELTZER PLUS COLD PO) Take 1-2 tablets by mouth 2 (two) times daily as needed (cold symptoms).     [provider]  Cholecalciferol (VITAMIN D) 50 MCG (2000 UT) tablet Take 2,000 Units by mouth daily.    [provider]  dapagliflozin propanediol (FARXIGA) 10 MG TABS tablet Take 1 tablet (10 mg total) by mouth daily. Must keep further appointments for refills 04/25/21   Bensimhon, Shaune Pascal, MD  diclofenac Sodium (VOLTAREN) 1 % GEL Apply topically as needed.    [provider]  ENTRESTO 97-103 MG TAKE 1 TABLET BY MOUTH TWICE DAILY 06/01/20   Bensimhon, Shaune Pascal, MD  furosemide (LASIX) 40 MG tablet Take 40 mg by mouth daily as needed. For fluid (if weight gain more than 3 lbs in 1 day)    [provider]   HYDROcodone-acetaminophen (NORCO/VICODIN) 5-325 MG tablet Take 1 tablet by mouth every 6 (six) hours as needed for moderate pain or severe pain. 04/19/21 04/19/22  McKenzie, Lennie Muckle, PA-C  metFORMIN (GLUCOPHAGE) 500 MG tablet TAKE 1 TABLET BY MOUTH DAILY WITH BREAKFAST 04/26/21   Ladell Pier, MD  methocarbamol (ROBAXIN) 500 MG tablet Take 1 tablet (500 mg total) by mouth every 6 (six) hours as needed for muscle spasms. 04/19/21   McKenzie, Lennie Muckle, PA-C  spironolactone (ALDACTONE) 25 MG tablet TAKE 1/2 TABLET(12.5 MG) BY MOUTH DAILY 03/19/21   Bensimhon, Shaune Pascal, MD  Tafamidis 61 MG CAPS TAKE 1 CAPSULE (61 MG) BY MOUTH DAILY. PLEASE DISCONTINUE VYNDAQEL. 10/04/20 10/04/21  Bensimhon, Shaune Pascal, MD    Family History Family History  Problem Relation Age of Onset   Diabetes Mother    Diabetes Sister    Clotting disorder Neg Hx    Lung disease Neg Hx     Social History Social History   Tobacco Use   Smoking status: Former    Packs/day: 1.50    Years:  18.00    Pack years: 27.00    Types: Cigarettes    Quit date: 03/15/2003    Years since quitting: 18.1   Smokeless tobacco: Never  Vaping Use   Vaping Use: Never used  Substance Use Topics   Alcohol use: No    Comment: none since 2004   Drug use: No    Comment: former  none since 2004     Allergies   Peanut-containing drug products, Penicillins, and Decadron [dexamethasone]   Review of Systems Review of Systems  Neurological:  Positive for light-headedness and headaches.  All other systems reviewed and are negative.   Physical Exam Triage Vital Signs ED Triage Vitals  Enc Vitals Group     BP 05/18/21 1053 (!) 94/54     Pulse Rate 05/18/21 1053 86     Resp 05/18/21 1053 16     Temp 05/18/21 1053 98 F (36.7 C)     Temp Source 05/18/21 1053 Oral     SpO2 05/18/21 1053 99 %     Weight --      Height --      Head Circumference --      Peak Flow --      Pain Score 05/18/21 1054 7     Pain Loc --      Pain Edu? --       Excl. in [place]? --    No data found.  Updated Vital Signs BP (!) 94/54 (BP Location: Right Arm)   Pulse 86   Temp 98 F (36.7 C) (Oral)   Resp 16   SpO2 99%   Visual Acuity Right Eye Distance:   Left Eye Distance:   Bilateral Distance:    Right Eye Near:   Left Eye Near:    Bilateral Near:     Physical Exam Vitals and nursing note reviewed.  Constitutional:      General: He is not in acute distress.    Appearance: Normal appearance. He is not ill-appearing, toxic-appearing or diaphoretic.  HENT:     Head: Normocephalic and atraumatic.  Eyes:     Extraocular Movements: Extraocular movements intact.     Conjunctiva/sclera: Conjunctivae normal.     Pupils: Pupils are equal, round, and reactive to light.  Cardiovascular:     Rate and Rhythm: Normal rate.     Pulses: Normal pulses.     Heart sounds: Normal heart sounds.  Pulmonary:     Effort: Pulmonary effort is normal.     Breath sounds: Normal breath sounds.  Abdominal:     General: Abdomen is flat.  Musculoskeletal:        General: Normal range of motion.     Cervical back: Normal range of motion.  Skin:    General: Skin is warm and dry.  Neurological:     General: No focal deficit present.     Mental Status: He is alert and oriented to person, place, and time.     GCS: GCS eye subscore is 4. GCS verbal subscore is 5. GCS motor subscore is 6.     Cranial Nerves: Cranial nerves are intact. No cranial nerve deficit, dysarthria or facial asymmetry.     Sensory: Sensation is intact.     Motor: Motor function is intact.     Coordination: Coordination is intact. Romberg sign negative.     Gait: Gait is intact.  Psychiatric:        Mood and Affect: Mood normal.     UC  Treatments / Results  Labs (all labs ordered are listed, but only abnormal results are displayed) Labs Reviewed - No data to display  EKG   Radiology No results found.  Procedures Procedures (including critical care time)  Medications  Ordered in UC Medications - No data to display  Initial Impression / Assessment and Plan / UC Course  I have reviewed the triage vital signs and the nursing notes.  Pertinent labs & imaging results that were available during my care of the patient were reviewed by me and considered in my medical decision making (see chart for details).    Assessment negative for red flags or concerns, including CVA or intracranial abnormality.  Hypotension.  After review of labs yesterday, like possible dehydration based on elevated creatinine of 1.31.  Encouraged fluid intake within fluid restriction.  Recommend holding carvedilol this evening and checking BP prior to taking medications tomorrow am.  Follow up with cardiology or primary care for re-evaluation of hypotension.   Final Clinical Impressions(s) / UC Diagnoses   Final diagnoses:  Hypotension, unspecified hypotension type  Dehydration     Discharge Instructions      Do not take your coreg dose tonight.   Check your BP tomorrow morning before you take your medications. If your BP is less than 100/60, do not your morning dose of coreg.   Make sure you are drinking plenty of fluids, especially water, as you are able.  Do not drink too many fluids, stick with the fluid restrictions you have been given (drink the largest amount you can based on fluid restrictions).    Follow up with your primary care or cardiologist as soon as possible for re-evaluation.       ED Prescriptions   None    PDMP not reviewed this encounter.   Pearson Forster, NP 05/18/21 1134

## 2021-05-18 NOTE — Discharge Instructions (Addendum)
Do not take your coreg dose tonight.   Check your BP tomorrow morning before you take your medications. If your BP is less than 100/60, do not your morning dose of coreg.   Make sure you are drinking plenty of fluids, especially water, as you are able.  Do not drink too many fluids, stick with the fluid restrictions you have been given (drink the largest amount you can based on fluid restrictions).    Follow up with your primary care or cardiologist as soon as possible for re-evaluation.

## 2021-05-21 ENCOUNTER — Telehealth (HOSPITAL_COMMUNITY): Payer: Self-pay | Admitting: *Deleted

## 2021-05-21 NOTE — Telephone Encounter (Signed)
Received VM from pt's CSW that pt is having a lot of trouble with hypotension, some meds have been stopped but pt is not feeling well, she request we call pt  I called and spoke w/pt, he states he has not been feeling well for over a week, he went to urgent care on Fri, they told him to hold carvedilol if BP less than 100/60 so he has held several doses of that, they also advised to drink more fluids as they felt pt was dehydrated. Pt states when he went to urgent care his BP was in the 80s and he felt horrible since then he has felt a little better but still with BP readings 100s and weak, dizzy. He called PCP and was told he could not get an appt until 9/28. Pt has taken all meds today. Advised pt to only take Entresto 1/2 tab BID tomorrow instead of whole tab and appt sch for Wed 8/24, he is agreeable and thankful for call.

## 2021-05-22 ENCOUNTER — Other Ambulatory Visit (HOSPITAL_COMMUNITY): Payer: Self-pay | Admitting: Internal Medicine

## 2021-05-23 ENCOUNTER — Other Ambulatory Visit: Payer: Self-pay

## 2021-05-23 ENCOUNTER — Encounter (HOSPITAL_COMMUNITY): Payer: Self-pay

## 2021-05-23 ENCOUNTER — Ambulatory Visit (HOSPITAL_COMMUNITY)
Admission: RE | Admit: 2021-05-23 | Discharge: 2021-05-23 | Disposition: A | Discharge status: Home / Self Care | Payer: Medicare Other | Source: Ambulatory Visit | Attending: Family Medicine | Admitting: Family Medicine

## 2021-05-23 VITALS — BP 128/78 | HR 80 | Wt 167.4 lb

## 2021-05-23 DIAGNOSIS — E118 Type 2 diabetes mellitus with unspecified complications: Secondary | ICD-10-CM

## 2021-05-23 DIAGNOSIS — Z8616 Personal history of COVID-19: Secondary | ICD-10-CM | POA: Diagnosis not present

## 2021-05-23 DIAGNOSIS — I959 Hypotension, unspecified: Secondary | ICD-10-CM

## 2021-05-23 DIAGNOSIS — I428 Other cardiomyopathies: Secondary | ICD-10-CM

## 2021-05-23 DIAGNOSIS — Z79899 Other long term (current) drug therapy: Secondary | ICD-10-CM | POA: Diagnosis not present

## 2021-05-23 DIAGNOSIS — I5022 Chronic systolic (congestive) heart failure: Secondary | ICD-10-CM

## 2021-05-23 DIAGNOSIS — Z87891 Personal history of nicotine dependence: Secondary | ICD-10-CM | POA: Insufficient documentation

## 2021-05-23 DIAGNOSIS — I10 Essential (primary) hypertension: Secondary | ICD-10-CM | POA: Diagnosis not present

## 2021-05-23 DIAGNOSIS — Z880 Allergy status to penicillin: Secondary | ICD-10-CM | POA: Diagnosis not present

## 2021-05-23 DIAGNOSIS — E854 Organ-limited amyloidosis: Secondary | ICD-10-CM | POA: Diagnosis not present

## 2021-05-23 DIAGNOSIS — I252 Old myocardial infarction: Secondary | ICD-10-CM | POA: Insufficient documentation

## 2021-05-23 DIAGNOSIS — I2699 Other pulmonary embolism without acute cor pulmonale: Secondary | ICD-10-CM

## 2021-05-23 DIAGNOSIS — I5042 Chronic combined systolic (congestive) and diastolic (congestive) heart failure: Secondary | ICD-10-CM | POA: Diagnosis not present

## 2021-05-23 DIAGNOSIS — Z7901 Long term (current) use of anticoagulants: Secondary | ICD-10-CM | POA: Diagnosis not present

## 2021-05-23 DIAGNOSIS — Z7984 Long term (current) use of oral hypoglycemic drugs: Secondary | ICD-10-CM | POA: Insufficient documentation

## 2021-05-23 DIAGNOSIS — I110 Hypertensive heart disease with heart failure: Secondary | ICD-10-CM | POA: Diagnosis not present

## 2021-05-23 DIAGNOSIS — Z86711 Personal history of pulmonary embolism: Secondary | ICD-10-CM | POA: Insufficient documentation

## 2021-05-23 DIAGNOSIS — Z833 Family history of diabetes mellitus: Secondary | ICD-10-CM | POA: Diagnosis not present

## 2021-05-23 DIAGNOSIS — Z95810 Presence of automatic (implantable) cardiac defibrillator: Secondary | ICD-10-CM

## 2021-05-23 DIAGNOSIS — E119 Type 2 diabetes mellitus without complications: Secondary | ICD-10-CM | POA: Diagnosis not present

## 2021-05-23 DIAGNOSIS — E8589 Other amyloidosis: Secondary | ICD-10-CM | POA: Diagnosis not present

## 2021-05-23 DIAGNOSIS — I43 Cardiomyopathy in diseases classified elsewhere: Secondary | ICD-10-CM

## 2021-05-23 DIAGNOSIS — Z86718 Personal history of other venous thrombosis and embolism: Secondary | ICD-10-CM | POA: Diagnosis not present

## 2021-05-23 LAB — CBC
HCT: 42.8 % (ref 39.0–52.0)
Hemoglobin: 12.9 g/dL — ABNORMAL LOW (ref 13.0–17.0)
MCH: 24.5 pg — ABNORMAL LOW (ref 26.0–34.0)
MCHC: 30.1 g/dL (ref 30.0–36.0)
MCV: 81.4 fL (ref 80.0–100.0)
Platelets: 208 10*3/uL (ref 150–400)
RBC: 5.26 MIL/uL (ref 4.22–5.81)
RDW: 17.9 % — ABNORMAL HIGH (ref 11.5–15.5)
WBC: 5.8 10*3/uL (ref 4.0–10.5)
nRBC: 0 % (ref 0.0–0.2)

## 2021-05-23 MED ORDER — ENTRESTO 49-51 MG PO TABS: 1.0000 | ORAL_TABLET | Freq: Two times a day (BID) | ORAL | 6 refills | Status: DC

## 2021-05-23 MED ORDER — CARVEDILOL 12.5 MG PO TABS
18.7500 mg | ORAL_TABLET | Freq: Two times a day (BID) | ORAL | 3 refills | Status: DC
Start: 2021-05-23 — End: 2021-08-20

## 2021-05-23 NOTE — Patient Instructions (Signed)
DECREASE Entresto to 49/51 mg, one tab twice a day  DECREASE Coreg to 18.75 mg (one and one half tab) twice a day  Labs today We will only contact you if something comes back abnormal or we need to make some changes. Otherwise no news is good news!   Your physician has requested that you regularly monitor and record your blood pressure readings at home. Please use the same machine at the same time of day to check your readings and record them to bring to your follow-up visit. -if your blood pressure continues to run low, please give our office a call  Keep follow up plans as scheduled to follow up with Dr Haroldine Laws in one year.   Do the following things EVERYDAY: Weigh yourself in the morning before breakfast. Write it down and keep it in a log. Take your medicines as prescribed Eat low salt foods--Limit salt (sodium) to 2000 mg per day.  Stay as active as you can everyday Limit all fluids for the day to less than 2 liters  At the [facility], you and your health needs are our priority. As part of our continuing mission to provide you with exceptional heart care, we have created designated Provider Care Teams. These Care Teams include your primary Cardiologist (physician) and Advanced Practice Providers (APPs- Physician Assistants and Nurse Practitioners) who all work together to provide you with the care you need, when you need it.   You may see any of the following providers on your designated Care Team at your next follow up: Dr [name] Dr Loralie Champagne Dr Patrice Paradise, NP Lyda Jester, [place] Ginnie Smart Audry Riles, PharmD   Please be sure to bring in all your medications bottles to every appointment.   If you have any questions or concerns before your next appointment please send [place] a message through [place] or call our office at [phone].    TO LEAVE A MESSAGE FOR THE NURSE SELECT OPTION 2, PLEASE LEAVE A MESSAGE  INCLUDING: YOUR NAME DATE OF BIRTH CALL BACK NUMBER REASON FOR CALL**this is important as we prioritize the call backs  YOU WILL RECEIVE A CALL BACK THE SAME DAY AS LONG AS YOU CALL BEFORE 4:00 PM

## 2021-05-23 NOTE — Progress Notes (Signed)
ADVANCED HF CLINIC   Primary Care: Dr Wynetta Emery at [facility]  Primary Cardiologist: Dr [name] EP: Dr Curt Bears HF: Dr Haroldine Laws  HPI: Jonathan Dickson is a 65 y.o. 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   Had Myoview in 5/17 with EF 22%. Subsequently underwent cath 6/17. EF 40-45%. Normal coronaries   Admitted to [facility] 8/19 with increased dyspnea. Diuresed with IV lasix. CTA performed and showed bilateral PE. Had AKI so diuretics held.  Placed on eliquis for PE.    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  Had u/s in 11/20 with acute LLE DVT. Switched from Eliquis to coumadin. Got COVID PNA over Christmas 12/20. CT chest showed no PE.  Follow up with Dr. Haroldine Laws 05/09/21, he was recovering from bone fusion to cervical spine. Worsening dyspnea the past year and having low BPs. Imdur + hydralazine recently stopped.  Today he returns for acute visit for low blood pressure. Has had dizziness and low BP at home, 80s/50s. Went to UC and told to hold carvedilol if BP<100/60. He has been taking 1/2 tablet of his Entresto. He says low BP has been an issue since his neck surgery. Not taking any narcotics prescribed from surgery. Breathing about the same. Denies CP, edema, or PND/Orthopnea. Appetite ok. No fever or chills. Weight at home 165 pounds. Urinating per usual, clear/yellow in color.  Cardiac Testing CTA 05/12/2018 --.large bilateral  PE   CPX 04/03/18 FVC 2.77 (83%)      FEV1 2.37 (91%)        FEV1/FVC 86 (109%)        MVV 76 (61%)  Resting HR: 83 Peak HR: 125   (80% age predicted max HR) BP rest: 144/84 BP peak: 168/72 Peak VO2: 23.1 (76% predicted peak VO2) VE/VCO2 slope:  41 OUES: 2.18 Peak RER: 0.93 VE/MVV:  88% O2pulse:  14   (100% predicted O2pulse)  PYP scan obtained which was positive (Visual grade 2. Quantitative 1.57).  cMRI 04/01/18 1. Mild LVE with mild LVH  septal thickness 13 mm. Diffuse hypokinesis worse in the septum apex and lateral walls EF 28%  2. Abnormal post gadolinium inversion recovery sequences with failure to null and uptake in the mid/subepicardial septum, inferior wall and lateral walls with some apical sparing  Findings consistent with amyloidosis or infiltrative cardiomyopathy  Echo 05/11/18 LVEF ~30% (continues to drop in setting of TTR amyloid). RV normal without evidence of acute strain. LE u/s without DVT.  ECHO 12/2017   Left ventricle: EF 30% to 35% (I felt closer to 40-45%). Mild LVH Diffuse hypokinesis. Doppler   parameters are consistent with abnormal left ventricular   relaxation (grade 1 diastolic dysfunction).  ECHO EF 08/2017  Left ventricle: EF 50% to 55%.   Review of systems complete and found to be negative unless listed in HPI.   Past Medical History:  Diagnosis Date   Abnormal TSH 02/2016   AICD (automatic cardioverter/defibrillator) present    Anxiety    Cervical disc disease    Chronic combined systolic and diastolic CHF (congestive heart failure) ([place])    a. 01/2016 Echo: EF 25%, inf AK, diffuse sev HK, Gr1 DD, mild MR.   Depression    Diabetes mellitus without complication ([place])    Dyspnea    Dysrhythmia    History of hiatal hernia    Hyperglycemia    Hypertension  Hypertensive heart disease    Myocardial infarction [facility])    ??  maybe   NICM (nonischemic cardiomyopathy) ([place])    a. 01/2016 Echo: EF 25%, inf AK, diffuse sev HK, Gr1 DD, mild MR; b. 01/2016 MV: EF 22%, no isch/infarct;  c. 02/2016 Cath: Nl cors, EF 35-45%.   Pneumonia    Pre-diabetes    Seizures (HCC)    with penicillin   Current Outpatient Medications  Medication Sig Dispense Refill   acetaminophen (TYLENOL) 500 MG tablet Take 1,000 mg by mouth every 6 (six) hours as needed for moderate pain or headache.     amitriptyline (ELAVIL) 25 MG tablet TAKE 1 TABLET(25 MG) BY MOUTH AT BEDTIME AS NEEDED FOR SLEEP 60 tablet 0    Aromatic Inhalants (VICKS VAPOINHALER) INHA Inhale 1 Dose into the lungs daily as needed (congestion).     atorvastatin (LIPITOR) 80 MG tablet TAKE 1 TABLET BY MOUTH EVERY DAY**SCHEDULE OFFICE VISIT 30 tablet 8   carvedilol (COREG) 25 MG tablet TAKE 1 TABLET(25 MG) BY MOUTH TWICE DAILY WITH A MEAL (Patient taking differently: Patient is taking it only if his blood pressure is above 100/60 as needed.) 180 tablet 2   Chlorphen-Phenyleph-ASA (ALKA-SELTZER PLUS COLD PO) Take 1-2 tablets by mouth 2 (two) times daily as needed (cold symptoms).      Cholecalciferol (VITAMIN D) 50 MCG (2000 UT) tablet Take 2,000 Units by mouth daily.     diclofenac Sodium (VOLTAREN) 1 % GEL Apply topically as needed.     FARXIGA 10 MG TABS tablet TAKE 1 TABLET(10 MG) BY MOUTH DAILY 90 tablet 3   furosemide (LASIX) 40 MG tablet Take 40 mg by mouth daily as needed. For fluid (if weight gain more than 3 lbs in 1 day)     HYDROcodone-acetaminophen (NORCO/VICODIN) 5-325 MG tablet Take 1 tablet by mouth every 6 (six) hours as needed for moderate pain or severe pain. 20 tablet 0   metFORMIN (GLUCOPHAGE) 500 MG tablet TAKE 1 TABLET BY MOUTH DAILY WITH BREAKFAST 30 tablet 5   methocarbamol (ROBAXIN) 500 MG tablet Take 1 tablet (500 mg total) by mouth every 6 (six) hours as needed for muscle spasms. 60 tablet 0   sacubitril-valsartan (ENTRESTO) 97-103 MG Take 1 tablet by mouth 2 (two) times daily.     spironolactone (ALDACTONE) 25 MG tablet TAKE 1/2 TABLET(12.5 MG) BY MOUTH DAILY 15 tablet 11   Tafamidis 61 MG CAPS TAKE 1 CAPSULE (61 MG) BY MOUTH DAILY. PLEASE DISCONTINUE VYNDAQEL. 30 capsule 11   No current facility-administered medications for this encounter.   Allergies  Allergen Reactions   Peanut-Containing Drug Products Swelling   Penicillins Other (See Comments)    CONVULSIONS Did it involve swelling of the face/tongue/throat, SOB, or low BP? No Did it involve sudden or severe rash/hives, skin peeling, or any reaction on  the inside of your mouth or nose? No Did you need to seek medical attention at a hospital or doctor's office? Yes When did it last happen?      25 years If all above answers are "NO", may proceed with cephalosporin use.    Decadron [Dexamethasone] Itching   Social History   Socioeconomic History   Marital status: Legally Separated    Spouse name: Not on file   Number of children: 3   Years of education: Not on file   Highest education level: Not on file  Occupational History   Not on file  Tobacco Use   Smoking status: Former  Packs/day: 1.50    Years: 18.00    Pack years: 27.00    Types: Cigarettes    Quit date: 03/15/2003    Years since quitting: 18.2   Smokeless tobacco: Never  Vaping Use   Vaping Use: Never used  Substance and Sexual Activity   Alcohol use: No    Comment: none since 2004   Drug use: No    Comment: former  none since 2004   Sexual activity: Not Currently  Other Topics Concern   Not on file  Social History Narrative   Not on file   Social Determinants of Health   Financial Resource Strain: Not on file  Food Insecurity: Not on file  Transportation Needs: Not on file  Physical Activity: Not on file  Stress: Not on file  Social Connections: Not on file  Intimate Partner Violence: Not on file    Family History  Problem Relation Age of Onset   Diabetes Mother    Diabetes Sister    Clotting disorder Neg Hx    Lung disease Neg Hx    BP 128/78   Pulse 80   Wt 75.9 kg (167 lb 6.4 oz)   SpO2 98%   BMI 27.02 kg/m   Wt Readings from Last 3 Encounters:  05/23/21 75.9 kg (167 lb 6.4 oz)  05/09/21 75.5 kg (166 lb 6.4 oz)  04/27/21 77.1 kg (170 lb)   PHYSICAL EXAM: General:  NAD. No resp difficulty HEENT: Hoarse voice Neck: Supple. No JVD. Carotids 2+ bilat; no bruits. No lymphadenopathy or thryomegaly appreciated. Cor: PMI nondisplaced. Regular rate & rhythm. No rubs, gallops or murmurs. Lungs: Clear Abdomen: Soft, nontender,  nondistended. No hepatosplenomegaly. No bruits or masses. Good bowel sounds. Extremities: No cyanosis, clubbing, rash, edema Neuro: Alert & oriented x 3, cranial nerves grossly intact. Moves all 4 extremities w/o difficulty. Affect pleasant.  ECG: SR 72 bpm (personally reviewed).  ICD interrogation: Unable to interrogate device in clinic today as network was down.  ASSESSMENT & PLAN: 1.  Hypotension: - Likely due to current medications. Does not appear to be dehydrated, no palpitations, abnormal bleeding, no ETOH/drugs. - Decrease Entresto to 49/51 mg bid. (Had been taking 1/2 tablet of 97/103 dose). - Decrease carvedilol to 18.75 mg bid. - Continue to check BP at home, if SBP<100 and dizzy, notify office. - Will ask device RN to send transmission. - ? If Barostim would be appropriate soon. - Labs last week OK, will check CBC today.  2. Chronic Systolic Heart Failure, [facility].  - ECHO EF dropped from 50-55%-->04/2018 25-30%  - Cath 6/17 normal cors. 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 No M-spike, normal IFE on myeloma panel.  - Repeat PRY 2/21. 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 - NYHA III-early IIIb symptoms. Volume status looks good today.  - Continue lasix 40 mg daily PRN. Has not needed any recently. - Continue Farxiga 10 mg daily. - Decrease carvedilol to 18.75 mg bid. (Hold if BP<100/60) - Decrease Entresto to 49/51 mg bid.  - Continue spiro 12.5 mg daily. - Device transmission as above. - ? Barostim soon.  3. DMII - Stable. On metformin - On SGLT2i.  4. HTN - Now low, see above med changes.  5. PE - CTA  04/2018 with large bilateral PE. - Was on Eliquis. Now back on warfarin due to acute LLE DVT in 11/20. - [facility]. - CT chest 12/20 no PE. - VQ ordered  to exclude chronic PE. - Needs lifelong AC.  Follow up with Dr. Haroldine Laws as scheduled.  [place], FNP  05/23/21

## 2021-05-24 ENCOUNTER — Other Ambulatory Visit (HOSPITAL_COMMUNITY): Payer: Self-pay

## 2021-05-24 MED FILL — Tafamidis Cap 61 MG: ORAL | 30 days supply | Qty: 30 | Fill #5 | Status: AC

## 2021-05-25 ENCOUNTER — Other Ambulatory Visit (HOSPITAL_COMMUNITY): Payer: Self-pay | Admitting: Family Medicine

## 2021-05-25 ENCOUNTER — Telehealth: Payer: Self-pay

## 2021-05-25 ENCOUNTER — Ambulatory Visit (INDEPENDENT_AMBULATORY_CARE_PROVIDER_SITE_OTHER): Payer: Medicare Other

## 2021-05-25 DIAGNOSIS — Z95810 Presence of automatic (implantable) cardiac defibrillator: Secondary | ICD-10-CM

## 2021-05-25 DIAGNOSIS — I5022 Chronic systolic (congestive) heart failure: Secondary | ICD-10-CM

## 2021-05-25 MED ORDER — FUROSEMIDE 40 MG PO TABS: 40.0000 mg | ORAL_TABLET | Freq: Every day | ORAL | 1 refills | Status: DC | PRN

## 2021-05-25 NOTE — Telephone Encounter (Signed)
16 minutes ago (3:16 PM)   Thank you Margarita Grizzle. No med changes at this time. I will send over new Rx for PRN lasix should he need some in future. Thank you!

## 2021-05-25 NOTE — Progress Notes (Signed)
Call to patient.  Advised no changes at this time but Furosemide refill prescription was sent by Janett Billow to the pharmacy.  Advised to pick up the medication to have on hand.  Reviewed fluid symptoms and advised to take as needed per prescription if fluid symptoms develop.

## 2021-05-25 NOTE — Telephone Encounter (Signed)
Spoke with patient.  Assisted sending remote transmission for fluid level review.  Pt is feeling well and runs catering business.    Has been experiencing low BP but was addressed at 8/24 HF clinic OV.    Pt has PRN Furosemide 40 mg tablets prescribed but does not have any on hand at this time.  He will need a prescription sent to preferred pharmacy listed if he needs to take Furosemide.   CorVue thoracic impedance suggesting possible fluid accumulation starting 8/14 with exception 8/18 & 8/24 at baseline.       Copy sent to Allena Katz, NP for review and recommendations if needed.

## 2021-05-25 NOTE — Telephone Encounter (Signed)
Attempted call to patient and no answer or voice mail option.  Remote transmission requested Per Allena Katz, NP at HF clinic to review fluid levels today, 05/25/2021.  Jonathan Dickson

## 2021-05-25 NOTE — Progress Notes (Signed)
16 minutes ago (3:16 PM)    Thank you Margarita Grizzle. No med changes at this time. I will send over new Rx for PRN lasix should he need some in future. Thank you!

## 2021-05-25 NOTE — Telephone Encounter (Signed)
Call to patient.  Advised no changes at this time but Furosemide refill prescription was sent by Janett Billow to the pharmacy.  Advised to pick up the medication to have on hand.  Reviewed fluid symptoms and advised to take as needed per prescription if fluid symptoms develop.

## 2021-05-25 NOTE — Progress Notes (Signed)
EPIC Encounter for ICM Monitoring  Patient Name: Jonathan Dickson is a 65 y.o. male Date: 05/25/2021 Primary Care Physican: Ladell Pier, MD Primary Cardiologist: [place] Electrophysiologist: [place]        1 time ICM check for HF clinic.  Spoke with patient and he is feeling.  Pt asymptomatic for fluid accumulation.  He has been experiencing low BP but was addressed at 8/24 HF clinic OV.  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   CorVue thoracic impedance suggesting possible fluid accumulation starting 8/14 with exception of 2 days at baseline.   Prescribed:  Furosemide 40 mg mg Take 40 mg by mouth daily as needed. For fluid (if weight gain more than 3 lbs in 1 day)  Recommendations: Copy sent to Allena Katz, NP at HF clinic in phone note for review and recommendations.  Follow-up plan:  91 day device clinic remote transmission 06/26/2021.    EP/Cardiology Office Visits:  None scheduled  Copy of ICM check sent to Dr Curt Bears.   3 month ICM trend: 05/25/2021.    1 Year ICM trend:       Rosalene Billings, RN 05/25/2021 2:35 PM

## 2021-05-29 ENCOUNTER — Encounter (HOSPITAL_COMMUNITY): Payer: Self-pay | Admitting: Cardiology

## 2021-05-31 ENCOUNTER — Other Ambulatory Visit (HOSPITAL_COMMUNITY): Payer: Self-pay

## 2021-06-01 DIAGNOSIS — Z9889 Other specified postprocedural states: Secondary | ICD-10-CM | POA: Diagnosis not present

## 2021-06-05 ENCOUNTER — Telehealth (HOSPITAL_COMMUNITY): Payer: Self-pay | Admitting: *Deleted

## 2021-06-05 NOTE — Telephone Encounter (Signed)
Pt called to report his bp 83/53 this morning after taking meds. Pt denies any dizziness, lightheadedness, or fatigue. Pt wants to know if he needs to adjust any medications.   Routed to [employer] for advice

## 2021-06-05 NOTE — Telephone Encounter (Signed)
Called pt no answer will try again later.                                              

## 2021-06-07 ENCOUNTER — Ambulatory Visit (INDEPENDENT_AMBULATORY_CARE_PROVIDER_SITE_OTHER): Payer: Medicare Other

## 2021-06-07 ENCOUNTER — Other Ambulatory Visit: Payer: Self-pay

## 2021-06-07 ENCOUNTER — Other Ambulatory Visit: Payer: Self-pay | Admitting: Orthopedic Surgery

## 2021-06-07 DIAGNOSIS — M25512 Pain in left shoulder: Secondary | ICD-10-CM

## 2021-06-07 DIAGNOSIS — Z7901 Long term (current) use of anticoagulants: Secondary | ICD-10-CM

## 2021-06-07 DIAGNOSIS — I2699 Other pulmonary embolism without acute cor pulmonale: Secondary | ICD-10-CM

## 2021-06-07 DIAGNOSIS — I824Y9 Acute embolism and thrombosis of unspecified deep veins of unspecified proximal lower extremity: Secondary | ICD-10-CM

## 2021-06-07 LAB — POCT INR: INR: 1.4 — AB (ref 2.0–3.0)

## 2021-06-07 NOTE — Telephone Encounter (Signed)
2nd attempt to reach pt by phone.

## 2021-06-07 NOTE — Patient Instructions (Signed)
-   take extra 1/2 tablet warfarin tonight and tomorrow, then - continue taking 1.5 tablets daily.   - Recheck INR 3 weeks

## 2021-06-08 ENCOUNTER — Other Ambulatory Visit (HOSPITAL_COMMUNITY): Payer: Self-pay | Admitting: Orthopedic Surgery

## 2021-06-08 DIAGNOSIS — M25512 Pain in left shoulder: Secondary | ICD-10-CM

## 2021-06-18 DIAGNOSIS — H25813 Combined forms of age-related cataract, bilateral: Secondary | ICD-10-CM | POA: Diagnosis not present

## 2021-06-18 DIAGNOSIS — H35033 Hypertensive retinopathy, bilateral: Secondary | ICD-10-CM | POA: Diagnosis not present

## 2021-06-18 DIAGNOSIS — H40013 Open angle with borderline findings, low risk, bilateral: Secondary | ICD-10-CM | POA: Diagnosis not present

## 2021-06-18 DIAGNOSIS — E119 Type 2 diabetes mellitus without complications: Secondary | ICD-10-CM | POA: Diagnosis not present

## 2021-06-18 LAB — HM DIABETES EYE EXAM

## 2021-06-26 ENCOUNTER — Ambulatory Visit (INDEPENDENT_AMBULATORY_CARE_PROVIDER_SITE_OTHER): Payer: Medicare Other

## 2021-06-26 ENCOUNTER — Other Ambulatory Visit (HOSPITAL_COMMUNITY): Payer: Self-pay

## 2021-06-26 DIAGNOSIS — I428 Other cardiomyopathies: Secondary | ICD-10-CM

## 2021-06-27 ENCOUNTER — Ambulatory Visit: Payer: Medicare Other | Attending: Internal Medicine | Admitting: Physician Assistant

## 2021-06-27 ENCOUNTER — Encounter: Payer: Self-pay | Admitting: Physician Assistant

## 2021-06-27 ENCOUNTER — Other Ambulatory Visit: Payer: Self-pay

## 2021-06-27 VITALS — BP 113/76 | HR 94 | Resp 16 | Wt 170.2 lb

## 2021-06-27 DIAGNOSIS — E119 Type 2 diabetes mellitus without complications: Secondary | ICD-10-CM

## 2021-06-27 DIAGNOSIS — Z23 Encounter for immunization: Secondary | ICD-10-CM | POA: Diagnosis not present

## 2021-06-27 DIAGNOSIS — Z09 Encounter for follow-up examination after completed treatment for conditions other than malignant neoplasm: Secondary | ICD-10-CM

## 2021-06-27 DIAGNOSIS — I959 Hypotension, unspecified: Secondary | ICD-10-CM

## 2021-06-27 LAB — CUP PACEART REMOTE DEVICE CHECK
Battery Remaining Longevity: 72 mo
Battery Remaining Percentage: 70 %
Battery Voltage: 2.98 V
Brady Statistic RV Percent Paced: 1 %
Date Time Interrogation Session: 20220927020016
HighPow Impedance: 63 Ohm
HighPow Impedance: 63 Ohm
Implantable Lead Implant Date: 20190918
Implantable Lead Location: 753860
Implantable Lead Model: 7122
Implantable Pulse Generator Implant Date: 20190918
Lead Channel Impedance Value: 460 Ohm
Lead Channel Pacing Threshold Amplitude: 0.5 V
Lead Channel Pacing Threshold Pulse Width: 0.5 ms
Lead Channel Sensing Intrinsic Amplitude: 12 mV
Lead Channel Setting Pacing Amplitude: 2.5 V
Lead Channel Setting Pacing Pulse Width: 0.5 ms
Lead Channel Setting Sensing Sensitivity: 0.5 mV
Pulse Gen Serial Number: 9796641

## 2021-06-27 LAB — GLUCOSE, POCT (MANUAL RESULT ENTRY): POC Glucose: 139 mg/dl — AB (ref 70–99)

## 2021-06-27 NOTE — Progress Notes (Signed)
Patient ID: Jonathan Dickson, male   DOB: 23-Apr-1956, 65 y.o.   MRN: [id number]     Jonathan Dickson, is a 65 y.o. male  NGE:[id number]  MWN:[id number]  DOB - 1955/10/31  Chief Complaint  Patient presents with   Diabetes       Subjective:   Jonathan Dickson is a 65 y.o. male here today for a follow up visit After UC visit for hypotension 8/19 then f/up with cardiology 05/24/2021.  He is doing much better.  He has only had to take lasix about 3 times over the last month.  No SOB.  No CP.  Weights stable.  Blood sugars running under 150 at home.   From cardiology A/P: ASSESSMENT & PLAN: 1.  Hypotension: - Likely due to current medications. Does not appear to be dehydrated, no palpitations, abnormal bleeding, no ETOH/drugs. - Decrease Entresto to 49/51 mg bid. (Had been taking 1/2 tablet of 97/103 dose). - Decrease carvedilol to 18.75 mg bid. - Continue to check BP at home, if SBP<100 and dizzy, notify office. - Will ask device RN to send transmission. - ? If Barostim would be appropriate soon. - Labs last week OK, will check CBC today.   2. Chronic Systolic Heart Failure, [facility].  - ECHO EF dropped from 50-55%-->04/2018 25-30%  - Cath 6/17 normal cors. 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 No M-spike, normal IFE on myeloma panel.  - Repeat PRY 2/21. 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 - NYHA III-early IIIb symptoms. Volume status looks good today.  - Continue lasix 40 mg daily PRN. Has not needed any recently. - Continue Farxiga 10 mg daily. - Decrease carvedilol to 18.75 mg bid. (Hold if BP<100/60) - Decrease Entresto to 49/51 mg bid.  - Continue spiro 12.5 mg daily. - Device transmission as above. - ? Barostim soon.   3. DMII - Stable. On metformin - On SGLT2i.   4. HTN - Now low, see  above med changes.   5. PE - CTA 04/2018 with large bilateral PE. - Was on Eliquis. Now back on warfarin due to acute LLE DVT in 11/20. - [facility] - CT chest 12/20 no PE. - VQ ordered to exclude chronic PE. - Needs lifelong AC. No problems updated.  ALLERGIES: Allergies  Allergen Reactions   Peanut-Containing Drug Products Swelling   Penicillins Other (See Comments)    CONVULSIONS Did it involve swelling of the face/tongue/throat, SOB, or low BP? No Did it involve sudden or severe rash/hives, skin peeling, or any reaction on the inside of your mouth or nose? No Did you need to seek medical attention at a hospital or doctor's office? Yes When did it last happen?      25 years If all above answers are "NO", may proceed with cephalosporin use.    Decadron [Dexamethasone] Itching    PAST MEDICAL HISTORY: Past Medical History:  Diagnosis Date   Abnormal TSH 02/2016   AICD (automatic cardioverter/defibrillator) present    Anxiety    Cervical disc disease    Chronic combined systolic and diastolic CHF (congestive heart failure) ([place])    a. 01/2016 Echo: EF 25%, inf AK, diffuse sev HK, Gr1 DD, mild MR.   Depression    Diabetes mellitus without  complication ([place])    Dyspnea    Dysrhythmia    History of hiatal hernia    Hyperglycemia    Hypertension    Hypertensive heart disease    Myocardial infarction [facility])    ??  maybe   NICM (nonischemic cardiomyopathy) ([place])    a. 01/2016 Echo: EF 25%, inf AK, diffuse sev HK, Gr1 DD, mild MR; b. 01/2016 MV: EF 22%, no isch/infarct;  c. 02/2016 Cath: Nl cors, EF 35-45%.   Pneumonia    Pre-diabetes    Seizures ([place])    with penicillin    MEDICATIONS AT HOME: Prior to Admission medications   Medication Sig Start Date End Date Taking? Authorizing Provider  acetaminophen (TYLENOL) 500 MG tablet Take 1,000 mg by mouth every 6 (six) hours as needed for moderate pain or headache.    [provider]  amitriptyline  (ELAVIL) 25 MG tablet TAKE 1 TABLET(25 MG) BY MOUTH AT BEDTIME AS NEEDED FOR SLEEP 06/15/20   Ladell Pier, MD  Aromatic Inhalants (VICKS VAPOINHALER) INHA Inhale 1 Dose into the lungs daily as needed (congestion).    [provider]  atorvastatin (LIPITOR) 80 MG tablet TAKE 1 TABLET BY MOUTH EVERY DAY**SCHEDULE OFFICE VISIT 01/08/21   Lorretta Harp, MD  carvedilol (COREG) 12.5 MG tablet Take 1.5 tablets (18.75 mg total) by mouth 2 (two) times daily with a meal. 05/23/21   [place]ford, Maricela Bo, FNP  Chlorphen-Phenyleph-ASA (ALKA-SELTZER PLUS COLD PO) Take 1-2 tablets by mouth 2 (two) times daily as needed (cold symptoms).     [provider]  Cholecalciferol (VITAMIN D) 50 MCG (2000 UT) tablet Take 2,000 Units by mouth daily.    [provider]  diclofenac Sodium (VOLTAREN) 1 % GEL Apply topically as needed.    [provider]  FARXIGA 10 MG TABS tablet TAKE 1 TABLET(10 MG) BY MOUTH DAILY 05/22/21   Bensimhon, Shaune Pascal, MD  furosemide (LASIX) 40 MG tablet Take 1 tablet (40 mg total) by mouth daily as needed. For fluid (if weight gain more than 3 lbs in 1 day) 05/25/21   [place], Maricela Bo, FNP  HYDROcodone-acetaminophen (NORCO/VICODIN) 5-325 MG tablet Take 1 tablet by mouth every 6 (six) hours as needed for moderate pain or severe pain. Patient not taking: Reported on 06/27/2021 04/19/21 04/19/22  Justice Britain, PA-C  metFORMIN (GLUCOPHAGE) 500 MG tablet TAKE 1 TABLET BY MOUTH DAILY WITH BREAKFAST 04/26/21   Ladell Pier, MD  methocarbamol (ROBAXIN) 500 MG tablet Take 1 tablet (500 mg total) by mouth every 6 (six) hours as needed for muscle spasms. 04/19/21   McKenzie, Lennie Muckle, PA-C  sacubitril-valsartan (ENTRESTO) 49-51 MG Take 1 tablet by mouth 2 (two) times daily. 05/23/21   Rafael Bihari, FNP  spironolactone (ALDACTONE) 25 MG tablet TAKE 1/2 TABLET(12.5 MG) BY MOUTH DAILY 03/19/21   Bensimhon, Shaune Pascal, MD  Tafamidis 61 MG CAPS TAKE 1 CAPSULE (61 MG)  BY MOUTH DAILY. PLEASE DISCONTINUE VYNDAQEL. 10/04/20 10/04/21  Bensimhon, Shaune Pascal, MD    ROS: Neg HEENT Neg resp Neg cardiac Neg GI Neg GU Neg MS Neg psych Neg neuro  Objective:   Vitals:   06/27/21 0843  BP: 113/76  Pulse: 94  Resp: 16  SpO2: 97%  Weight: 170 lb 3.2 oz (77.2 kg)   Exam General appearance : Awake, alert, not in any distress. Speech Clear. Not toxic looking HEENT: Atraumatic and Normocephalic Neck: Supple, no JVD. No cervical lymphadenopathy.  Chest: Good air entry bilaterally,  CTAB.  No rales/rhonchi/wheezing CVS: S1 S2 regular, no murmurs.  Extremities: B/L Lower Ext shows no edema, both legs are warm to touch Neurology: Awake alert, and oriented X 3, CN II-XII intact, Non focal Skin: No Rash  Data Review Lab Results  Component Value Date   HGBA1C 7.4 (H) 04/10/2021   HGBA1C 6.4 10/30/2020   HGBA1C 7.1 (H) 09/24/2019    Assessment & Plan   1. Type 2 diabetes mellitus without complication, without long-term current use of insulin (HCC) Improving.  Blood sugars at home under 150 - Glucose (CBG)  2. Need for immunization against influenza - Flu Vaccine QUAD 51mo+IM (Fluarix, Fluzone & Alfiuria Quad PF)  3. Encounter for examination following treatment at hospital Doing well.  LABS were all trending toward normal or normal at last check  4. Hypotension, unspecified hypotension type Resolved since cardiology visit.    PLEASE rf ALL MEDS UNTIL NEXT APPT IN December/January.    Patient have been counseled extensively about nutrition and exercise. Other issues discussed during this visit include: low cholesterol diet, weight control and daily exercise, foot care, annual eye examinations at Ophthalmology, importance of adherence with medications and regular follow-up. We also discussed long term complications of uncontrolled diabetes and hypertension.   Return for appt with DR Wynetta Emery in December please.  The patient was given clear instructions to  go to ER or return to medical center if symptoms don't improve, worsen or new problems develop. The patient verbalized understanding. The patient was told to call to get lab results if they haven't heard anything in the next week.      Freeman Caldron, PA-C [facility] and [facility], [place]   06/27/2021, 9:07 AM

## 2021-06-29 ENCOUNTER — Ambulatory Visit (INDEPENDENT_AMBULATORY_CARE_PROVIDER_SITE_OTHER): Payer: Medicare Other

## 2021-06-29 ENCOUNTER — Other Ambulatory Visit: Payer: Self-pay

## 2021-06-29 DIAGNOSIS — I2699 Other pulmonary embolism without acute cor pulmonale: Secondary | ICD-10-CM

## 2021-06-29 DIAGNOSIS — Z7901 Long term (current) use of anticoagulants: Secondary | ICD-10-CM

## 2021-06-29 LAB — POCT INR: INR: 1 — AB (ref 2.0–3.0)

## 2021-06-29 NOTE — Patient Instructions (Signed)
-   Take 3 tablets today - Increase to 1.5 tablets daily, except 2 tablets Wednesday  - Recheck INR 1 week

## 2021-06-30 DIAGNOSIS — M75120 Complete rotator cuff tear or rupture of unspecified shoulder, not specified as traumatic: Secondary | ICD-10-CM

## 2021-06-30 HISTORY — DX: Complete rotator cuff tear or rupture of unspecified shoulder, not specified as traumatic: M75.120

## 2021-07-02 ENCOUNTER — Other Ambulatory Visit (HOSPITAL_COMMUNITY): Payer: Self-pay

## 2021-07-02 ENCOUNTER — Telehealth: Payer: Self-pay | Admitting: Internal Medicine

## 2021-07-02 ENCOUNTER — Other Ambulatory Visit (HOSPITAL_COMMUNITY): Payer: Self-pay | Admitting: Unknown Physician Specialty

## 2021-07-02 MED ORDER — ENTRESTO 49-51 MG PO TABS: 1.0000 | ORAL_TABLET | Freq: Two times a day (BID) | ORAL | 11 refills | Status: DC

## 2021-07-02 MED FILL — Tafamidis Cap 61 MG: ORAL | 30 days supply | Qty: 30 | Fill #6 | Status: AC

## 2021-07-02 NOTE — Telephone Encounter (Signed)
Patient called and advised he should have enough medication because it was sent to the pharmacy on 05/23/21 #60/6 refills. He says he didn't pick that one up and the one they gave him was the higher dosage 97-103 mg that cause his BP to go up, so the doctor lowered the dosage to 49-51 mg. He says he has one day left and will be out and the pharmacy said the insurance will not refill until 07/05/21. I called [place] and spoke to [place], [facility] who verified receipt of the Entresto on 05/23/21, he says the patient picked up on 06/12/21 and insurance will not allow pick up until 07/05/21. I advised the patient of the above and advised that with taking it twice a day, he should have enough to last until 07/12/21. He says he is not taking it like that and he's going to be out of the medication before 07/05/21. I advised to call the cardiology office and let them know how he's taking, since that's who prescribed and according to his profile, it's to be taken BID, not as he says he's taking. He verbalized understanding.

## 2021-07-02 NOTE — Telephone Encounter (Signed)
Medication Refill - Medication: Jonathan Dickson   Has the patient contacted their pharmacy? Yes.   Pt states that he only has one more day worth. He states that he will be out by tomorrow morning and that the pharmacy cannot fill due to insurance until 07/05/21. Please advise.  (Agent: If no, request that the patient contact the pharmacy for the refill.) (Agent: If yes, when and what did the pharmacy advise?)  Preferred Pharmacy (with phone number or street name):  [facility] [address], [address]  [place] [address]  Phone: [phone] Fax: [phone]  Hours: Open 24 hours   Has the patient been seen for an appointment in the last year OR does the patient have an upcoming appointment? Yes.    Agent: Please be advised that RX refills may take up to 3 business days. We ask that you follow-up with your pharmacy.

## 2021-07-02 NOTE — Telephone Encounter (Signed)
Pt called the heart failure clinic stating that he is taking 1.5 pills twice a day of his entresto 49-51. Pt ran out of his medication before it was time to refill. I explained to the pt he should only be taking 1 pill twice a day. D/w Allena Katz, NP.  New script sent to [facility] for the pt.   Tanda Rockers RN, BSN VAD Coordinator 24/7 Pager [phone]

## 2021-07-04 ENCOUNTER — Ambulatory Visit (INDEPENDENT_AMBULATORY_CARE_PROVIDER_SITE_OTHER): Payer: Medicare Other

## 2021-07-04 ENCOUNTER — Other Ambulatory Visit: Payer: Self-pay

## 2021-07-04 DIAGNOSIS — I2699 Other pulmonary embolism without acute cor pulmonale: Secondary | ICD-10-CM | POA: Diagnosis not present

## 2021-07-04 DIAGNOSIS — Z7901 Long term (current) use of anticoagulants: Secondary | ICD-10-CM | POA: Diagnosis not present

## 2021-07-04 LAB — POCT INR: INR: 0.9 — AB (ref 2.0–3.0)

## 2021-07-04 NOTE — Patient Instructions (Signed)
-   Take 3 tablets today and 2 tablets tomorrow - Increase to 1.5 tablets daily, except 2 tablets Monday, Wednesday and Friday - Recheck INR 1 week

## 2021-07-04 NOTE — Progress Notes (Signed)
Remote ICD transmission.   

## 2021-07-09 ENCOUNTER — Other Ambulatory Visit: Payer: Self-pay

## 2021-07-09 ENCOUNTER — Ambulatory Visit (INDEPENDENT_AMBULATORY_CARE_PROVIDER_SITE_OTHER): Payer: Medicare Other

## 2021-07-09 DIAGNOSIS — Z7901 Long term (current) use of anticoagulants: Secondary | ICD-10-CM

## 2021-07-09 DIAGNOSIS — I2699 Other pulmonary embolism without acute cor pulmonale: Secondary | ICD-10-CM | POA: Diagnosis not present

## 2021-07-09 LAB — POCT INR: INR: 1 — AB (ref 2.0–3.0)

## 2021-07-09 NOTE — Patient Instructions (Signed)
-   Take another 1.5 tablets today  - Increase to 2 tablets daily, except 1.5 tablets Monday and Friday - Recheck INR 2 weeks

## 2021-07-10 DIAGNOSIS — Z9889 Other specified postprocedural states: Secondary | ICD-10-CM | POA: Diagnosis not present

## 2021-07-12 ENCOUNTER — Ambulatory Visit: Payer: Medicare Other | Admitting: Internal Medicine

## 2021-07-16 ENCOUNTER — Encounter: Payer: Self-pay | Admitting: Podiatry

## 2021-07-16 ENCOUNTER — Other Ambulatory Visit: Payer: Self-pay

## 2021-07-16 ENCOUNTER — Ambulatory Visit (INDEPENDENT_AMBULATORY_CARE_PROVIDER_SITE_OTHER): Payer: Medicare Other | Admitting: Podiatry

## 2021-07-16 ENCOUNTER — Ambulatory Visit (HOSPITAL_COMMUNITY)
Admission: RE | Admit: 2021-07-16 | Discharge: 2021-07-16 | Disposition: A | Discharge status: Home / Self Care | Payer: Medicare Other | Source: Ambulatory Visit | Attending: Orthopedic Surgery | Admitting: Orthopedic Surgery

## 2021-07-16 DIAGNOSIS — M75122 Complete rotator cuff tear or rupture of left shoulder, not specified as traumatic: Secondary | ICD-10-CM | POA: Diagnosis not present

## 2021-07-16 DIAGNOSIS — M79674 Pain in right toe(s): Secondary | ICD-10-CM

## 2021-07-16 DIAGNOSIS — M79675 Pain in left toe(s): Secondary | ICD-10-CM

## 2021-07-16 DIAGNOSIS — B351 Tinea unguium: Secondary | ICD-10-CM | POA: Diagnosis not present

## 2021-07-16 DIAGNOSIS — E119 Type 2 diabetes mellitus without complications: Secondary | ICD-10-CM

## 2021-07-16 DIAGNOSIS — I739 Peripheral vascular disease, unspecified: Secondary | ICD-10-CM

## 2021-07-16 DIAGNOSIS — M25512 Pain in left shoulder: Secondary | ICD-10-CM | POA: Diagnosis not present

## 2021-07-16 DIAGNOSIS — D689 Coagulation defect, unspecified: Secondary | ICD-10-CM

## 2021-07-16 DIAGNOSIS — M25412 Effusion, left shoulder: Secondary | ICD-10-CM | POA: Diagnosis not present

## 2021-07-16 NOTE — Progress Notes (Signed)
Informed of MRI for today.   Device system confirmed to be MRI conditional, with implant date > 6 weeks ago, and no evidence of abandoned or epicardial leads in review of most recent CXR Interrogation from today reviewed, pt is currently VS with <1% VP Change device settings for MRI to  OVO    Tachy-therapies to off if applicable.  Program device back to pre-MRI settings after completion of exam.  Shirley Friar, PA-C  07/16/2021 1:07 PM

## 2021-07-16 NOTE — Progress Notes (Signed)
[facility] today at [facility] for [facility] left shoulder wo contrast. Patient has St. Jude ICD. Transmission sent to [facility]. Orders received for pacing off, tachy therapies disabled. Will re-program once scan is completed.

## 2021-07-16 NOTE — Progress Notes (Signed)
This patient returns to my office for at risk foot care.  This patient requires this care by a professional since this patient will be at risk due to having coagulation defect, diabetes and peripheral claudication.  Patient is taking coumadin.  This patient is unable to cut nails himself since the patient cannot reach his nails.These nails are painful walking and wearing shoes.  This patient presents for at risk foot care today.  General Appearance  Alert, conversant and in no acute stress.  Vascular  Dorsalis pedis and posterior tibial  pulses are weakly palpable  bilaterally.  Capillary return is within normal limits  bilaterally. Cold feet bilaterally. Absent digital hair.  Neurologic  Senn-Weinstein monofilament wire test within normal limits  bilaterally. Muscle power within normal limits bilaterally.  Nails Thick disfigured discolored nails with subungual debris  from hallux to fifth toes bilaterally. No evidence of bacterial infection or drainage bilaterally.  Orthopedic  No limitations of motion  feet .  No crepitus or effusions noted.  No bony pathology or digital deformities noted.  Skin  normotropic skin with no porokeratosis noted bilaterally.  No signs of infections or ulcers noted.     Onychomycosis  Pain in right toes  Pain in left toes  Consent was obtained for treatment procedures.   Mechanical debridement of nails 1-5  bilaterally performed with a nail nipper.  Filed with dremel without incident.    Return office visit   4 months                   Told patient to return for periodic foot care and evaluation due to potential at risk complications.   Gardiner Barefoot DPM

## 2021-07-18 DIAGNOSIS — M75122 Complete rotator cuff tear or rupture of left shoulder, not specified as traumatic: Secondary | ICD-10-CM | POA: Diagnosis not present

## 2021-07-21 ENCOUNTER — Other Ambulatory Visit: Payer: Self-pay | Admitting: Cardiovascular Disease

## 2021-07-21 DIAGNOSIS — I10 Essential (primary) hypertension: Secondary | ICD-10-CM

## 2021-07-21 DIAGNOSIS — I428 Other cardiomyopathies: Secondary | ICD-10-CM

## 2021-07-21 DIAGNOSIS — I5022 Chronic systolic (congestive) heart failure: Secondary | ICD-10-CM

## 2021-07-23 ENCOUNTER — Other Ambulatory Visit: Payer: Self-pay

## 2021-07-23 ENCOUNTER — Ambulatory Visit (INDEPENDENT_AMBULATORY_CARE_PROVIDER_SITE_OTHER): Payer: Medicare Other | Admitting: *Deleted

## 2021-07-23 ENCOUNTER — Other Ambulatory Visit: Payer: Self-pay | Admitting: Cardiovascular Disease

## 2021-07-23 DIAGNOSIS — I2699 Other pulmonary embolism without acute cor pulmonale: Secondary | ICD-10-CM

## 2021-07-23 DIAGNOSIS — Z7901 Long term (current) use of anticoagulants: Secondary | ICD-10-CM

## 2021-07-23 LAB — POCT INR
INR: 1 — AB (ref 2.0–3.0)
INR: 1 — AB (ref 2.0–3.0)

## 2021-07-23 MED ORDER — WARFARIN SODIUM 5 MG PO TABS: ORAL_TABLET | ORAL | 1 refills | Status: DC

## 2021-07-23 NOTE — Patient Instructions (Signed)
Description   Take 2.5 tablets (12.5mg ) of warfarin today and tomorrow then start taking warfarin 2 tablets (10mg ) daily. Recheck INR 1 week. [facility] [phone]

## 2021-07-24 ENCOUNTER — Other Ambulatory Visit (HOSPITAL_COMMUNITY): Payer: Self-pay

## 2021-07-30 ENCOUNTER — Other Ambulatory Visit (HOSPITAL_COMMUNITY): Payer: Self-pay

## 2021-07-30 ENCOUNTER — Ambulatory Visit (INDEPENDENT_AMBULATORY_CARE_PROVIDER_SITE_OTHER): Payer: Medicare Other

## 2021-07-30 ENCOUNTER — Other Ambulatory Visit: Payer: Self-pay

## 2021-07-30 DIAGNOSIS — I2699 Other pulmonary embolism without acute cor pulmonale: Secondary | ICD-10-CM | POA: Diagnosis not present

## 2021-07-30 DIAGNOSIS — Z7901 Long term (current) use of anticoagulants: Secondary | ICD-10-CM

## 2021-07-30 LAB — POCT INR: INR: 2.4 (ref 2.0–3.0)

## 2021-07-30 MED ORDER — FUROSEMIDE 40 MG PO TABS: 40.0000 mg | ORAL_TABLET | Freq: Every day | ORAL | 0 refills | Status: DC | PRN

## 2021-07-30 NOTE — Patient Instructions (Signed)
Continue warfarin 2 tablets (10mg ) daily. Recheck INR 3 weeks. [facility] [phone]

## 2021-08-01 ENCOUNTER — Other Ambulatory Visit (HOSPITAL_COMMUNITY): Payer: Self-pay

## 2021-08-01 MED FILL — Tafamidis Cap 61 MG: ORAL | 30 days supply | Qty: 30 | Fill #7 | Status: AC

## 2021-08-17 ENCOUNTER — Emergency Department (HOSPITAL_COMMUNITY): Payer: Medicare Other

## 2021-08-17 ENCOUNTER — Other Ambulatory Visit: Payer: Self-pay

## 2021-08-17 ENCOUNTER — Encounter (HOSPITAL_COMMUNITY): Payer: Self-pay | Admitting: Emergency Medicine

## 2021-08-17 ENCOUNTER — Emergency Department (HOSPITAL_COMMUNITY)
Admission: EM | Admit: 2021-08-17 | Discharge: 2021-08-18 | Disposition: A | Discharge status: Home / Self Care | Payer: Medicare Other | Attending: Emergency Medicine | Admitting: Emergency Medicine

## 2021-08-17 DIAGNOSIS — Z87891 Personal history of nicotine dependence: Secondary | ICD-10-CM | POA: Diagnosis not present

## 2021-08-17 DIAGNOSIS — D689 Coagulation defect, unspecified: Secondary | ICD-10-CM | POA: Diagnosis not present

## 2021-08-17 DIAGNOSIS — I5022 Chronic systolic (congestive) heart failure: Secondary | ICD-10-CM | POA: Diagnosis not present

## 2021-08-17 DIAGNOSIS — I252 Old myocardial infarction: Secondary | ICD-10-CM | POA: Diagnosis not present

## 2021-08-17 DIAGNOSIS — M47816 Spondylosis without myelopathy or radiculopathy, lumbar region: Secondary | ICD-10-CM | POA: Diagnosis not present

## 2021-08-17 DIAGNOSIS — R1030 Lower abdominal pain, unspecified: Secondary | ICD-10-CM | POA: Diagnosis not present

## 2021-08-17 DIAGNOSIS — Z91010 Allergy to peanuts: Secondary | ICD-10-CM | POA: Diagnosis not present

## 2021-08-17 DIAGNOSIS — R319 Hematuria, unspecified: Secondary | ICD-10-CM | POA: Insufficient documentation

## 2021-08-17 DIAGNOSIS — Z79899 Other long term (current) drug therapy: Secondary | ICD-10-CM | POA: Diagnosis not present

## 2021-08-17 DIAGNOSIS — Z7984 Long term (current) use of oral hypoglycemic drugs: Secondary | ICD-10-CM | POA: Diagnosis not present

## 2021-08-17 DIAGNOSIS — Z7901 Long term (current) use of anticoagulants: Secondary | ICD-10-CM | POA: Diagnosis not present

## 2021-08-17 DIAGNOSIS — E119 Type 2 diabetes mellitus without complications: Secondary | ICD-10-CM | POA: Diagnosis not present

## 2021-08-17 DIAGNOSIS — I110 Hypertensive heart disease with heart failure: Secondary | ICD-10-CM | POA: Insufficient documentation

## 2021-08-17 DIAGNOSIS — R791 Abnormal coagulation profile: Secondary | ICD-10-CM

## 2021-08-17 DIAGNOSIS — K5790 Diverticulosis of intestine, part unspecified, without perforation or abscess without bleeding: Secondary | ICD-10-CM | POA: Diagnosis not present

## 2021-08-17 DIAGNOSIS — N3289 Other specified disorders of bladder: Secondary | ICD-10-CM | POA: Diagnosis not present

## 2021-08-17 LAB — URINALYSIS, ROUTINE W REFLEX MICROSCOPIC
Bilirubin Urine: NEGATIVE
Glucose, UA: >=500 mg/dL — AB
Ketones, ur: NEGATIVE mg/dL
Leukocytes,Ua: NEGATIVE
Nitrite: NEGATIVE
Protein, ur: NEGATIVE mg/dL
Specific Gravity, Urine: 1.02 (ref 1.005–1.030)
pH: 6 (ref 5.0–8.0)

## 2021-08-17 LAB — CBC WITH DIFFERENTIAL/PLATELET
Abs Immature Granulocytes: 0.02 10*3/uL (ref 0.00–0.07)
Basophils Absolute: 0.1 10*3/uL (ref 0.0–0.1)
Basophils Relative: 1 %
Eosinophils Absolute: 0.2 10*3/uL (ref 0.0–0.5)
Eosinophils Relative: 4 %
HCT: 45.4 % (ref 39.0–52.0)
Hemoglobin: 14.2 g/dL (ref 13.0–17.0)
Immature Granulocytes: 0 %
Lymphocytes Relative: 42 %
Lymphs Abs: 2.8 10*3/uL (ref 0.7–4.0)
MCH: 25.2 pg — ABNORMAL LOW (ref 26.0–34.0)
MCHC: 31.3 g/dL (ref 30.0–36.0)
MCV: 80.6 fL (ref 80.0–100.0)
Monocytes Absolute: 0.5 10*3/uL (ref 0.1–1.0)
Monocytes Relative: 8 %
Neutro Abs: 3.1 10*3/uL (ref 1.7–7.7)
Neutrophils Relative %: 45 %
Platelets: 242 10*3/uL (ref 150–400)
RBC: 5.63 MIL/uL (ref 4.22–5.81)
RDW: 16.9 % — ABNORMAL HIGH (ref 11.5–15.5)
WBC: 6.8 10*3/uL (ref 4.0–10.5)
nRBC: 0 % (ref 0.0–0.2)

## 2021-08-17 LAB — COMPREHENSIVE METABOLIC PANEL
ALT: 17 U/L (ref 0–44)
AST: 34 U/L (ref 15–41)
Albumin: 3.7 g/dL (ref 3.5–5.0)
Alkaline Phosphatase: 88 U/L (ref 38–126)
Anion gap: 8 (ref 5–15)
BUN: 15 mg/dL (ref 8–23)
CO2: 19 mmol/L — ABNORMAL LOW (ref 22–32)
Calcium: 9.3 mg/dL (ref 8.9–10.3)
Chloride: 108 mmol/L (ref 98–111)
Creatinine, Ser: 1.25 mg/dL — ABNORMAL HIGH (ref 0.61–1.24)
GFR, Estimated: >60 mL/min (ref >60)
Glucose, Bld: 94 mg/dL (ref 70–99)
Potassium: 4.5 mmol/L (ref 3.5–5.1)
Sodium: 135 mmol/L (ref 135–145)
Total Bilirubin: 0.9 mg/dL (ref 0.3–1.2)
Total Protein: 7 g/dL (ref 6.5–8.1)

## 2021-08-17 LAB — PROTIME-INR
INR: 4.8 (ref 0.8–1.2)
Prothrombin Time: 45.2 seconds — ABNORMAL HIGH (ref 11.4–15.2)

## 2021-08-17 LAB — URINALYSIS, MICROSCOPIC (REFLEX)
Bacteria, UA: NONE SEEN
RBC / HPF: >50 RBC/hpf (ref 0–5)
Squamous Epithelial / HPF: NONE SEEN (ref 0–5)

## 2021-08-17 LAB — APTT: aPTT: 50 seconds — ABNORMAL HIGH (ref 24–36)

## 2021-08-17 NOTE — ED Provider Notes (Signed)
Emergency Medicine Provider Triage Evaluation Note  Jonathan Dickson , a 65 y.o. male  was evaluated in triage.  Pt complains of dysuria and hematuria.  Patient reports that symptoms started today at approximately noon.  Patient reports that he is seeing large blood clots as well as blood mixed into his urine.  Patient endorses taking Coumadin.  Denies any trauma.  Review of Systems  Positive: Hematuria, dysuria Negative: Urinary frequency, urinary urgency, swelling or tenderness to genitals, abdominal pain, nausea, vomiting flank pain, back pain  Physical Exam  BP 136/87 (BP Location: Right Arm)   Pulse 83   Temp 97.7 F (36.5 C) (Oral)   Resp (!) 22   Ht 5\' 6"  (1.676 m)   Wt 70.3 kg   SpO2 100%   BMI 25.02 kg/m  Gen:   Awake, no distress   Resp:  Normal effort  MSK:   Moves extremities without difficulty  Other:  Abdomen soft, nondistended, nontender.  No guarding or rebound tenderness.  Negative CVA tenderness  Medical Decision Making  Medically screening exam initiated at 6:23 PM.  Appropriate orders placed.  Jonathan Dickson was informed that the remainder of the evaluation will be completed by another provider, this initial triage assessment does not replace that evaluation, and the importance of remaining in the ED until their evaluation is complete.     Loni Beckwith, PA-C 08/17/21 Saddie Benders, MD 08/17/21 2210

## 2021-08-17 NOTE — ED Provider Notes (Signed)
[facility] EMERGENCY DEPARTMENT Provider Note   CSN: [id number] Arrival date & time: 08/17/21  1727     History Chief Complaint  Patient presents with   Hematuria    Jonathan Dickson is a 65 y.o. male with a hx of CHF, AICD, DM, hypertension, pulmonary fibrosis and prior DVT on coumadin who presents to the ED with complaints of hematuria that began this afternoon. Patient reports gross hematuria beginning around noon, has had blood tinged urine with some clot passage with each episode of urination since onset. Reports associated intermittent dysuria & suprapubic pain. No alleviating/aggravating factors. Denies fever, chills, N/V, flank pain, urinary retention, frequency, hematochezia, melena, lightheadedness, dizziness, or recent trauma. He had a nose bleed yesterday, none today. He takes coumadin, currently taking 10 mg daily, recent increase from 7.5 mg daily about 1.5 months ago due to subtherapeutic INRs, scheduled for recheck 08/20/21.   HPI     Past Medical History:  Diagnosis Date   Abnormal TSH 02/2016   AICD (automatic cardioverter/defibrillator) present    Anxiety    Cervical disc disease    Chronic combined systolic and diastolic CHF (congestive heart failure) ([place])    a. 01/2016 Echo: EF 25%, inf AK, diffuse sev HK, Gr1 DD, mild MR.   Depression    Diabetes mellitus without complication ([place])    Dyspnea    Dysrhythmia    History of hiatal hernia    Hyperglycemia    Hypertension    Hypertensive heart disease    Myocardial infarction [facility])    ??  maybe   NICM (nonischemic cardiomyopathy) ([place])    a. 01/2016 Echo: EF 25%, inf AK, diffuse sev HK, Gr1 DD, mild MR; b. 01/2016 MV: EF 22%, no isch/infarct;  c. 02/2016 Cath: Nl cors, EF 35-45%.   Pneumonia    Pre-diabetes    Seizures ([place])    with penicillin    Patient Active Problem List   Diagnosis Date Noted   Interstitial pulmonary fibrosis ([place]) 02/27/2021   Influenza vaccine needed 10/30/2020    COVID-19 vaccine series completed 10/30/2020   Dyspnea on exertion 10/30/2020   ICD (implantable cardioverter-defibrillator) in place 05/16/2020   Blood clotting disorder ([place]) 03/08/2020   Long term (current) use of anticoagulants 08/06/2019   DVT (deep venous thrombosis) ([place]) 08/04/2019   Hypertensive retinopathy 06/30/2019   Pain due to onychomycosis of toenails of both feet 05/28/2019   Hyperlipidemia associated with type 2 diabetes mellitus ([place]) 05/15/2019   Type 2 diabetes mellitus without complication, without long-term current use of insulin ([place]) 05/13/2019   Cardiac amyloidosis ([place]) 22/10/5425   Chronic systolic heart failure ([place]) 06/17/2018   Heart failure ([place]) 05/11/2018   Claudication in peripheral vascular disease ([place]) 12/31/2017   Sensorineural hearing loss (SNHL) of both ears 11/25/2017   Decreased hearing of both ears 11/10/2017   Cervical disc disease with myelopathy 07/10/2016   NICM (nonischemic cardiomyopathy) ([place]) 03/12/2016   Congestive dilated cardiomyopathy ([place]) 03/11/2016   Cervical radiculitis 04/19/2015   Essential hypertension 09/27/2014    Past Surgical History:  Procedure Laterality Date   ANTERIOR CERVICAL DECOMP/DISCECTOMY FUSION N/A 07/10/2016   Procedure: ANTERIOR CERVICAL DECOMPRESSION FUSION CERVICAL 4-5, CERVICAL 5-6, CERVICAL 6-7 WITH INSTRUMENTATION AND ALLOGRAFT;  Surgeon: [name], MD;  Location: [facility];  Service: Orthopedics;  Laterality: N/A;   ANTERIOR CERVICAL DECOMP/DISCECTOMY FUSION N/A 04/19/2021   Procedure: ANTERIOR CERVICAL DECOMPRESSION FUSION CERVICAL 3- CERVICAL 4 WITH INSTRUMENTATION AND ALLOGRAFT;  Surgeon: [name], MD;  Location:  [facility];  Service: Orthopedics;  Laterality: N/A;   CARDIAC CATHETERIZATION N/A 03/11/2016   Procedure: Left Heart Cath and Coronary Angiography;  Surgeon: [name], MD;  Location: [facility];  Service: Cardiovascular;  Laterality: N/A;   ICD IMPLANT N/A 06/17/2018    Procedure: ICD IMPLANT;  Surgeon: [name], MD;  Location: [facility];  Service: Cardiovascular;  Laterality: N/A;   Thumb surgery Right    VENTRAL HERNIA REPAIR N/A 10/25/2019   Procedure: PRIMARY VENTRAL HERNIA REPAIR;  Surgeon: [name], MD;  Location: [facility];  Service: General;  Laterality: N/A;       Family History  Problem Relation Age of Onset   Diabetes Mother    Diabetes Sister    Clotting disorder Neg Hx    Lung disease Neg Hx     Social History   Tobacco Use   Smoking status: Former    Packs/day: 1.50    Years: 18.00    Pack years: 27.00    Types: Cigarettes    Quit date: 03/15/2003    Years since quitting: 18.4   Smokeless tobacco: Never  Vaping Use   Vaping Use: Never used  Substance Use Topics   Alcohol use: No    Comment: none since 2004   Drug use: No    Comment: former  none since 2004    Home Medications Prior to Admission medications   Medication Sig Start Date End Date Taking? Authorizing Provider  acetaminophen (TYLENOL) 500 MG tablet Take 1,000 mg by mouth every 6 (six) hours as needed for moderate pain or headache.    [provider]  amitriptyline (ELAVIL) 25 MG tablet TAKE 1 TABLET(25 MG) BY MOUTH AT BEDTIME AS NEEDED FOR SLEEP 06/15/20   Ladell Pier, MD  Aromatic Inhalants (VICKS VAPOINHALER) INHA Inhale 1 Dose into the lungs daily as needed (congestion).    [provider]  atorvastatin (LIPITOR) 80 MG tablet TAKE 1 TABLET BY MOUTH EVERY DAY**SCHEDULE OFFICE VISIT 01/08/21   Lorretta Harp, MD  carvedilol (COREG) 12.5 MG tablet Take 1.5 tablets (18.75 mg total) by mouth 2 (two) times daily with a meal. 05/23/21   [place], Maricela Bo, FNP  Chlorphen-Phenyleph-ASA (ALKA-SELTZER PLUS COLD PO) Take 1-2 tablets by mouth 2 (two) times daily as needed (cold symptoms).     [provider]  Cholecalciferol (VITAMIN D) 50 MCG (2000 UT) tablet Take 2,000 Units by mouth daily.    [provider]  diclofenac Sodium (VOLTAREN) 1 % GEL Apply topically as needed.    [provider]  FARXIGA 10 MG TABS tablet TAKE 1 TABLET(10 MG) BY MOUTH DAILY 05/22/21   Bensimhon, Shaune Pascal, MD  furosemide (LASIX) 40 MG tablet Take 1 tablet (40 mg total) by mouth daily as needed. For fluid (if weight gain more than 3 lbs in 1 day) 07/30/21   [place], Maricela Bo, FNP  HYDROcodone-acetaminophen (NORCO/VICODIN) 5-325 MG tablet Take 1 tablet by mouth every 6 (six) hours as needed for moderate pain or severe pain. Patient not taking: Reported on 06/27/2021 04/19/21 04/19/22  Justice Britain, PA-C  metFORMIN (GLUCOPHAGE) 500 MG tablet TAKE 1 TABLET BY MOUTH DAILY WITH BREAKFAST 04/26/21   Ladell Pier, MD  methocarbamol (ROBAXIN) 500 MG tablet Take 1 tablet (500 mg total) by mouth every 6 (six) hours as needed for muscle spasms. 04/19/21   McKenzie, Lennie Muckle, PA-C  sacubitril-valsartan (ENTRESTO) 49-51 MG Take 1 tablet by mouth  2 (two) times daily. 07/02/21   Rafael Bihari, FNP  spironolactone (ALDACTONE) 25 MG tablet TAKE 1/2 TABLET(12.5 MG) BY MOUTH DAILY 03/19/21   Bensimhon, Shaune Pascal, MD  Tafamidis 61 MG CAPS TAKE 1 CAPSULE (61 MG) BY MOUTH DAILY. PLEASE DISCONTINUE VYNDAQEL. 10/04/20 10/04/21  Bensimhon, Shaune Pascal, MD  warfarin (COUMADIN) 5 MG tablet Take 2 tablets by mouth daily as directed by the coumadin clinic 07/23/21   Lorretta Harp, MD    Allergies    Peanut-containing drug products, Penicillins, and Decadron [dexamethasone]  Review of Systems   Review of Systems  Constitutional:  Negative for chills and fever.  Respiratory:  Negative for shortness of breath.   Cardiovascular:  Negative for chest pain.  Gastrointestinal:  Positive for abdominal pain. Negative for blood in stool, diarrhea, nausea and vomiting.  Genitourinary:  Positive for dysuria and hematuria. Negative for flank pain and testicular pain.  Neurological:  Negative for dizziness, syncope and  light-headedness.  All other systems reviewed and are negative.  Physical Exam Updated Vital Signs BP 103/82 (BP Location: Right Arm)   Pulse 82   Temp 97.7 F (36.5 C) (Oral)   Resp 18   Ht 5\' 6"  (1.676 m)   Wt 70.3 kg   SpO2 100%   BMI 25.02 kg/m   Physical Exam Vitals and nursing note reviewed.  Constitutional:      General: He is not in acute distress.    Appearance: He is well-developed. He is not toxic-appearing.  HENT:     Head: Normocephalic and atraumatic.  Eyes:     General:        Right eye: No discharge.        Left eye: No discharge.     Conjunctiva/sclera: Conjunctivae normal.  Cardiovascular:     Rate and Rhythm: Normal rate and regular rhythm.  Pulmonary:     Effort: Pulmonary effort is normal. No respiratory distress.     Breath sounds: Normal breath sounds. No wheezing, rhonchi or rales.  Abdominal:     General: There is no distension.     Palpations: Abdomen is soft.     Tenderness: There is abdominal tenderness (mild suprapubic). There is no right CVA tenderness, left CVA tenderness, guarding or rebound.  Musculoskeletal:     Cervical back: Neck supple.  Skin:    General: Skin is warm and dry.     Findings: No rash.  Neurological:     Mental Status: He is alert.     Comments: Clear speech.   Psychiatric:        Behavior: Behavior normal.    ED Results / Procedures / Treatments   Labs (all labs ordered are listed, but only abnormal results are displayed) Labs Reviewed  URINALYSIS, ROUTINE W REFLEX MICROSCOPIC - Abnormal; Notable for the following components:      Result Value   Color, Urine RED (*)    APPearance CLOUDY (*)    Glucose, UA >=500 (*)    Hgb urine dipstick LARGE (*)    All other components within normal limits  APTT - Abnormal; Notable for the following components:   aPTT 50 (*)    All other components within normal limits  PROTIME-INR - Abnormal; Notable for the following components:   Prothrombin Time 45.2 (*)    INR  4.8 (*)    All other components within normal limits  CBC WITH DIFFERENTIAL/PLATELET - Abnormal; Notable for the following components:   MCH 25.2 (*)  RDW 16.9 (*)    All other components within normal limits  COMPREHENSIVE METABOLIC PANEL - Abnormal; Notable for the following components:   CO2 19 (*)    Creatinine, Ser 1.25 (*)    All other components within normal limits  URINE CULTURE  URINALYSIS, MICROSCOPIC (REFLEX)    EKG None  Radiology CT Renal Stone Study  Result Date: 08/17/2021 CLINICAL DATA:  Flank pain and hematuria EXAM: CT ABDOMEN AND PELVIS WITHOUT CONTRAST TECHNIQUE: Multidetector CT imaging of the abdomen and pelvis was performed following the standard protocol without IV contrast. COMPARISON:  09/17/2019 FINDINGS: Lower chest: Lung bases demonstrate mild scarring in the left base. No sizable effusion is seen. Hepatobiliary: No focal liver abnormality is seen. No gallstones, gallbladder wall thickening, or biliary dilatation. Pancreas: Unremarkable. No pancreatic ductal dilatation or surrounding inflammatory changes. Spleen: Normal in size without focal abnormality. Adrenals/Urinary Tract: Adrenal glands are within normal limits. Kidneys are well visualized without renal calculi or obstructive changes. Ureters are within normal limits. The bladder is well distended. Stomach/Bowel: Scattered diverticular change of the colon is noted without evidence of diverticulitis. No obstructive or inflammatory changes are seen. The appendix is not well visualized and may have been surgically removed. No inflammatory changes to suggest appendicitis are seen. Small bowel and stomach appear within normal limits. Vascular/Lymphatic: Aortic atherosclerosis. No enlarged abdominal or pelvic lymph nodes. Reproductive: Prostate is unremarkable. Other: No abdominal wall hernia or abnormality. No abdominopelvic ascites. Musculoskeletal: Degenerative changes of lumbar spine are noted. IMPRESSION:  Diverticulosis without diverticulitis. No other focal abnormality is seen. Electronically Signed   By: Inez Catalina M.D.   On: 08/17/2021 23:25    Procedures Procedures   Medications Ordered in ED Medications - No data to display  ED Course  I have reviewed the triage vital signs and the nursing notes.  Pertinent labs & imaging results that were available during my care of the patient were reviewed by me and considered in my medical decision making (see chart for details).    MDM Rules/Calculators/A&P                           Patient presents to the ED with complaints of hematuria.  Nontoxic, vitals unremarkable.  Exam w/ mild supapubic abdominal tenderness without peritoneal signs.   Additional history obtained:  Additional history obtained from chart review & nursing note review.   Lab Tests:  I reviewed and interpreted labs, which included:  CBC: Hgb/hct WNL.  CMP: Renal function similar to prior.  APTT: Mildly elevated PT/INR: Supratherapeutic with INR 4.8 UA: Hematuria, no significant UTI, sent for culture Imaging Studies ordered:  I ordered imaging studies which included CT renal stone study, I independently reviewed, formal radiology impression shows:  Diverticulosis without diverticulitis. No other focal abnormality is seen.  ED Course:  Patient with hematuria at home and on urinalysis likely secondary to supratherapeutic INR. UA without obvious infection, sent for culture due to patient's sxs. CT renal stone study without obstructive uropathy or other acute significant abnormality. Renal function similar to prior. No additional areas of bleeding currently. Hgb/hct WNL. Patient overall well appearing. Plan to discharge home and have the patient hold his coumadin until his follow up Monday for repeat INR with very strict return precautions. I discussed results, treatment plan, need for follow-up, and return precautions with the patient. Provided opportunity for questions,  patient confirmed understanding and is in agreement with plan.    This is  a shared visit with supervising physician Dr. Vanita Panda who has independently evaluated patient & provided guidance in evaluation/management/disposition, in agreement with care   Portions of this note were generated with Dragon dictation software. Dictation errors may occur despite best attempts at proofreading.  Final Clinical Impression(s) / ED Diagnoses Final diagnoses:  Hematuria, unspecified type  Supratherapeutic INR    Rx / DC Orders ED Discharge Orders     None        Amaryllis Dyke, PA-C 08/18/21 Blenda Nicely, MD 08/18/21 [phone]

## 2021-08-17 NOTE — ED Triage Notes (Signed)
Patient reports hematuria starting today. Takes coumadin. Denies any pain or trauma.

## 2021-08-17 NOTE — ED Notes (Signed)
Patient transported to CT 

## 2021-08-18 NOTE — Discharge Instructions (Addendum)
You were seen in the ER today for blood in your urine.  Your CT scan showed diverticulosis- please see attached handout. Your Urine did have blood in it, there were not findings of infection, this was sent for culture and we will call you if this is abnormal.  Your INR which is checked with you being on coumadin was elevated at 4.5- we suspect this is contributing/causing your bleeding, this puts you at higher risk for bleeding in general.  Please DO NOT take your coumadin until you have your level rechecked Monday 08/20/21. Please be sure to follow up for this as scheduled.   The rest of your labs looked similar to prior.   Follow up as discussed above.  Return to the ER for new or worsening symptoms including but not limited to new or worsening pain, worsening bleeding, inability to urinate, not feeling like you are emptying your bladder, fever, vomiting, dizziness, lightheadedness, passing out, blood in vomit, blood in stool, head injury, traumatic injury, or any other concerns.

## 2021-08-18 NOTE — ED Notes (Signed)
E-signature pad unavailable at time of pt discharge. This RN discussed discharge materials with pt and answered all pt questions. Pt stated understanding of discharge material. ? ?

## 2021-08-19 LAB — URINE CULTURE: Culture: <10000 — AB

## 2021-08-20 ENCOUNTER — Other Ambulatory Visit (HOSPITAL_COMMUNITY): Payer: Self-pay

## 2021-08-20 ENCOUNTER — Other Ambulatory Visit: Payer: Self-pay

## 2021-08-20 ENCOUNTER — Ambulatory Visit (INDEPENDENT_AMBULATORY_CARE_PROVIDER_SITE_OTHER): Payer: Medicare Other

## 2021-08-20 DIAGNOSIS — I428 Other cardiomyopathies: Secondary | ICD-10-CM

## 2021-08-20 DIAGNOSIS — Z7901 Long term (current) use of anticoagulants: Secondary | ICD-10-CM | POA: Diagnosis not present

## 2021-08-20 DIAGNOSIS — I2699 Other pulmonary embolism without acute cor pulmonale: Secondary | ICD-10-CM | POA: Diagnosis not present

## 2021-08-20 DIAGNOSIS — I10 Essential (primary) hypertension: Secondary | ICD-10-CM

## 2021-08-20 DIAGNOSIS — I5022 Chronic systolic (congestive) heart failure: Secondary | ICD-10-CM

## 2021-08-20 LAB — POCT INR: INR: 1.7 — AB (ref 2.0–3.0)

## 2021-08-20 MED ORDER — CARVEDILOL 12.5 MG PO TABS: 12.5000 mg | ORAL_TABLET | Freq: Two times a day (BID) | ORAL | 0 refills | Status: DC

## 2021-08-20 NOTE — Patient Instructions (Signed)
Take 1 tablet today only and then decrease to 2 tablets (10mg ) daily, except Monday and Friday 1 tablet. Recheck INR 2 weeks. Coumadin Clinic [phone]

## 2021-08-27 ENCOUNTER — Other Ambulatory Visit (HOSPITAL_COMMUNITY): Payer: Self-pay

## 2021-08-27 MED ORDER — FUROSEMIDE 40 MG PO TABS: 40.0000 mg | ORAL_TABLET | Freq: Every day | ORAL | 6 refills | Status: DC | PRN

## 2021-08-30 ENCOUNTER — Other Ambulatory Visit (HOSPITAL_COMMUNITY): Payer: Self-pay

## 2021-08-30 MED FILL — Tafamidis Cap 61 MG: ORAL | 30 days supply | Qty: 30 | Fill #8 | Status: AC

## 2021-09-03 ENCOUNTER — Other Ambulatory Visit (HOSPITAL_COMMUNITY): Payer: Self-pay

## 2021-09-03 ENCOUNTER — Ambulatory Visit (INDEPENDENT_AMBULATORY_CARE_PROVIDER_SITE_OTHER): Payer: Medicare Other

## 2021-09-03 ENCOUNTER — Other Ambulatory Visit: Payer: Self-pay

## 2021-09-03 DIAGNOSIS — I2699 Other pulmonary embolism without acute cor pulmonale: Secondary | ICD-10-CM | POA: Diagnosis not present

## 2021-09-03 DIAGNOSIS — Z7901 Long term (current) use of anticoagulants: Secondary | ICD-10-CM

## 2021-09-03 LAB — POCT INR: INR: 4.2 — AB (ref 2.0–3.0)

## 2021-09-03 NOTE — Patient Instructions (Signed)
HOLD tomorrow  only and then decrease to 2 tablets (10mg ) daily, except Monday, Wednesday and Friday 1 tablet. Recheck INR 2 weeks. Coumadin Clinic [phone]

## 2021-09-11 ENCOUNTER — Other Ambulatory Visit: Payer: Self-pay

## 2021-09-11 ENCOUNTER — Encounter (HOSPITAL_COMMUNITY): Payer: Self-pay | Admitting: Emergency Medicine

## 2021-09-11 ENCOUNTER — Emergency Department (HOSPITAL_COMMUNITY)
Admission: EM | Admit: 2021-09-11 | Discharge: 2021-09-11 | Disposition: A | Discharge status: Home / Self Care | Payer: Medicare Other | Attending: Emergency Medicine | Admitting: Emergency Medicine

## 2021-09-11 DIAGNOSIS — Z91010 Allergy to peanuts: Secondary | ICD-10-CM | POA: Diagnosis not present

## 2021-09-11 DIAGNOSIS — R0981 Nasal congestion: Secondary | ICD-10-CM | POA: Diagnosis not present

## 2021-09-11 DIAGNOSIS — Z87891 Personal history of nicotine dependence: Secondary | ICD-10-CM | POA: Diagnosis not present

## 2021-09-11 DIAGNOSIS — I5042 Chronic combined systolic (congestive) and diastolic (congestive) heart failure: Secondary | ICD-10-CM | POA: Diagnosis not present

## 2021-09-11 DIAGNOSIS — R059 Cough, unspecified: Secondary | ICD-10-CM | POA: Insufficient documentation

## 2021-09-11 DIAGNOSIS — H66002 Acute suppurative otitis media without spontaneous rupture of ear drum, left ear: Secondary | ICD-10-CM | POA: Insufficient documentation

## 2021-09-11 DIAGNOSIS — Z7984 Long term (current) use of oral hypoglycemic drugs: Secondary | ICD-10-CM | POA: Insufficient documentation

## 2021-09-11 DIAGNOSIS — Z7901 Long term (current) use of anticoagulants: Secondary | ICD-10-CM | POA: Insufficient documentation

## 2021-09-11 DIAGNOSIS — Z20822 Contact with and (suspected) exposure to covid-19: Secondary | ICD-10-CM | POA: Diagnosis not present

## 2021-09-11 DIAGNOSIS — E119 Type 2 diabetes mellitus without complications: Secondary | ICD-10-CM | POA: Diagnosis not present

## 2021-09-11 DIAGNOSIS — Z79899 Other long term (current) drug therapy: Secondary | ICD-10-CM | POA: Insufficient documentation

## 2021-09-11 DIAGNOSIS — H9202 Otalgia, left ear: Secondary | ICD-10-CM | POA: Diagnosis present

## 2021-09-11 DIAGNOSIS — I110 Hypertensive heart disease with heart failure: Secondary | ICD-10-CM | POA: Insufficient documentation

## 2021-09-11 LAB — RESP PANEL BY RT-PCR (FLU A&B, COVID) ARPGX2
Influenza A by PCR: NEGATIVE
Influenza B by PCR: NEGATIVE
SARS Coronavirus 2 by RT PCR: NEGATIVE

## 2021-09-11 MED ORDER — CEFDINIR 300 MG PO CAPS: 300.0000 mg | ORAL_CAPSULE | Freq: Two times a day (BID) | ORAL | 0 refills | Status: AC

## 2021-09-11 MED ORDER — CEFDINIR 300 MG PO CAPS
300.0000 mg | ORAL_CAPSULE | Freq: Two times a day (BID) | ORAL | Status: DC
  Administered 2021-09-11: 300 mg via ORAL
  Filled 2021-09-11: qty 1

## 2021-09-11 NOTE — Discharge Instructions (Signed)
Take antibiotics as prescribed.  Take entire course, even if your symptoms improve. Continue taking home medications as prescribed. Follow-up with your primary care doctor if your pain is not improving. Use Tylenol as needed for pain control. Do not put or stick anything in your ear, including a Q-tip, as this will likely cause recurrent bleeding. Return to the emergency room with any new, worsening, or concerning symptoms.

## 2021-09-11 NOTE — ED Triage Notes (Signed)
C/o L ear pain since coughing last night.

## 2021-09-11 NOTE — ED Provider Notes (Signed)
Emergency Medicine Provider Triage Evaluation Note  SUSANO CLECKLER , a 65 y.o. male  was evaluated in triage.  Pt complains of left ear pain following coughing spell last night.  Denies fever, sinus congestion, or other complaints.  Review of Systems  Positive: Left ear pain Negative: Fever, drainage from left ear, sinus congestion  Physical Exam  BP (!) 117/100 (BP Location: Left Arm)    Pulse 88    Temp 98.4 F (36.9 C) (Oral)    Resp 18    SpO2 100%  Gen:   Awake, no distress   Resp:  Normal effort  MSK:   Moves extremities without difficulty  Other:  Visible blood on left TM.  Without mastoid tenderness.   Medical Decision Making  Medically screening exam initiated at 3:01 PM.  Appropriate orders placed.  JOSIMAR CORNING was informed that the remainder of the evaluation will be completed by another provider, this initial triage assessment does not replace that evaluation, and the importance of remaining in the ED until their evaluation is complete.     Evlyn Courier, PA-C 09/11/21 1502    Daleen Bo, MD 09/11/21 1536

## 2021-09-11 NOTE — ED Notes (Signed)
DC instructions reviewed with pt. PT verbalized understanding. PT DC °

## 2021-09-11 NOTE — ED Provider Notes (Signed)
[facility] EMERGENCY DEPARTMENT Provider Note   CSN: [id number] Arrival date & time: 09/11/21  1326     History Chief Complaint  Patient presents with   Ear Pain    Jonathan Dickson is a 65 y.o. male presenting for evaluation of left ear pain.  Patient states he has had mild sinus congestion and a cough for few days.  Last night he was coughing when he developed pain in his left ear.  He went to the drugstore and got an ear irrigation kit, which increased his pain.  He has not done or taken anything else for symptoms.  No fevers or chills.  No chest pain or shortness of breath.  Nothing makes his pain better or worse. He is on warfarin  HPI     Past Medical History:  Diagnosis Date   Abnormal TSH 02/2016   AICD (automatic cardioverter/defibrillator) present    Anxiety    Cervical disc disease    Chronic combined systolic and diastolic CHF (congestive heart failure) ([place])    a. 01/2016 Echo: EF 25%, inf AK, diffuse sev HK, Gr1 DD, mild MR.   Depression    Diabetes mellitus without complication ([place])    Dyspnea    Dysrhythmia    History of hiatal hernia    Hyperglycemia    Hypertension    Hypertensive heart disease    Myocardial infarction [facility])    ??  maybe   NICM (nonischemic cardiomyopathy) ([place])    a. 01/2016 Echo: EF 25%, inf AK, diffuse sev HK, Gr1 DD, mild MR; b. 01/2016 MV: EF 22%, no isch/infarct;  c. 02/2016 Cath: Nl cors, EF 35-45%.   Pneumonia    Pre-diabetes    Seizures ([place])    with penicillin    Patient Active Problem List   Diagnosis Date Noted   Interstitial pulmonary fibrosis ([place]) 02/27/2021   Influenza vaccine needed 10/30/2020   COVID-19 vaccine series completed 10/30/2020   Dyspnea on exertion 10/30/2020   ICD (implantable cardioverter-defibrillator) in place 05/16/2020   Blood clotting disorder ([place]) 03/08/2020   Long term (current) use of anticoagulants 08/06/2019   DVT (deep venous thrombosis) ([place]) 08/04/2019    Hypertensive retinopathy 06/30/2019   Pain due to onychomycosis of toenails of both feet 05/28/2019   Hyperlipidemia associated with type 2 diabetes mellitus ([place]) 05/15/2019   Type 2 diabetes mellitus without complication, without long-term current use of insulin ([place]) 05/13/2019   Cardiac amyloidosis ([place]) 44/09/270   Chronic systolic heart failure ([place]) 06/17/2018   Heart failure ([place]) 05/11/2018   Claudication in peripheral vascular disease ([place]) 12/31/2017   Sensorineural hearing loss (SNHL) of both ears 11/25/2017   Decreased hearing of both ears 11/10/2017   Cervical disc disease with myelopathy 07/10/2016   NICM (nonischemic cardiomyopathy) ([place]) 03/12/2016   Congestive dilated cardiomyopathy ([place]) 03/11/2016   Cervical radiculitis 04/19/2015   Essential hypertension 09/27/2014    Past Surgical History:  Procedure Laterality Date   ANTERIOR CERVICAL DECOMP/DISCECTOMY FUSION N/A 07/10/2016   Procedure: ANTERIOR CERVICAL DECOMPRESSION FUSION CERVICAL 4-5, CERVICAL 5-6, CERVICAL 6-7 WITH INSTRUMENTATION AND ALLOGRAFT;  Surgeon: [name], MD;  Location: [place];  Service: Orthopedics;  Laterality: N/A;   ANTERIOR CERVICAL DECOMP/DISCECTOMY FUSION N/A 04/19/2021   Procedure: ANTERIOR CERVICAL DECOMPRESSION FUSION CERVICAL 3- CERVICAL 4 WITH INSTRUMENTATION AND ALLOGRAFT;  Surgeon: [name], MD;  Location: [place];  Service: Orthopedics;  Laterality: N/A;   CARDIAC CATHETERIZATION N/A 03/11/2016   Procedure: Left Heart Cath and Coronary Angiography;  Surgeon: [name], MD;  Location: [place] CV LAB;  Service: Cardiovascular;  Laterality: N/A;   ICD IMPLANT N/A 06/17/2018   Procedure: ICD IMPLANT;  Surgeon: [name], MD;  Location: [place] CV LAB;  Service: Cardiovascular;  Laterality: N/A;   Thumb surgery Right    VENTRAL HERNIA REPAIR N/A 10/25/2019   Procedure: PRIMARY VENTRAL HERNIA REPAIR;  Surgeon: [name], MD;  Location: [facility];  Service:  General;  Laterality: N/A;       Family History  Problem Relation Age of Onset   Diabetes Mother    Diabetes Sister    Clotting disorder Neg Hx    Lung disease Neg Hx     Social History   Tobacco Use   Smoking status: Former    Packs/day: 1.50    Years: 18.00    Pack years: 27.00    Types: Cigarettes    Quit date: 03/15/2003    Years since quitting: 18.5   Smokeless tobacco: Never  Vaping Use   Vaping Use: Never used  Substance Use Topics   Alcohol use: No    Comment: none since 2004   Drug use: No    Comment: former  none since 2004    Home Medications Prior to Admission medications   Medication Sig Start Date End Date Taking? Authorizing Provider  cefdinir (OMNICEF) 300 MG capsule Take 1 capsule (300 mg total) by mouth 2 (two) times daily for 7 days. 09/11/21 09/18/21 Yes Elic Vencill, PA-C  acetaminophen (TYLENOL) 500 MG tablet Take 1,000 mg by mouth every 6 (six) hours as needed for moderate pain or headache.    [provider]  amitriptyline (ELAVIL) 25 MG tablet TAKE 1 TABLET(25 MG) BY MOUTH AT BEDTIME AS NEEDED FOR SLEEP 06/15/20   Ladell Pier, MD  Aromatic Inhalants (VICKS VAPOINHALER) INHA Inhale 1 Dose into the lungs daily as needed (congestion).    [provider]  atorvastatin (LIPITOR) 80 MG tablet TAKE 1 TABLET BY MOUTH EVERY DAY**SCHEDULE OFFICE VISIT 01/08/21   Lorretta Harp, MD  carvedilol (COREG) 12.5 MG tablet Take 1 tablet (12.5 mg total) by mouth 2 (two) times daily with a meal. 08/20/21   [place], Maricela Bo, FNP  Chlorphen-Phenyleph-ASA (ALKA-SELTZER PLUS COLD PO) Take 1-2 tablets by mouth 2 (two) times daily as needed (cold symptoms).     [provider]  Cholecalciferol (VITAMIN D) 50 MCG (2000 UT) tablet Take 2,000 Units by mouth daily.    [provider]  diclofenac Sodium (VOLTAREN) 1 % GEL Apply topically as needed.    [provider]  FARXIGA 10 MG TABS tablet TAKE 1 TABLET(10 MG) BY  MOUTH DAILY 05/22/21   Bensimhon, Shaune Pascal, MD  furosemide (LASIX) 40 MG tablet Take 1 tablet (40 mg total) by mouth daily as needed. For fluid (if weight gain more than 3 lbs in 1 day) 08/27/21   [place], Maricela Bo, FNP  HYDROcodone-acetaminophen (NORCO/VICODIN) 5-325 MG tablet Take 1 tablet by mouth every 6 (six) hours as needed for moderate pain or severe pain. Patient not taking: Reported on 06/27/2021 04/19/21 04/19/22  Justice Britain, PA-C  metFORMIN (GLUCOPHAGE) 500 MG tablet TAKE 1 TABLET BY MOUTH DAILY WITH BREAKFAST 04/26/21   Ladell Pier, MD  methocarbamol (ROBAXIN) 500 MG tablet Take 1 tablet (500 mg total) by mouth every 6 (six) hours as needed for muscle spasms. 04/19/21   McKenzie, Lennie Muckle, PA-C  sacubitril-valsartan (ENTRESTO) 49-51 MG Take 1  tablet by mouth 2 (two) times daily. 07/02/21   Rafael Bihari, FNP  spironolactone (ALDACTONE) 25 MG tablet TAKE 1/2 TABLET(12.5 MG) BY MOUTH DAILY 03/19/21   Bensimhon, Shaune Pascal, MD  Tafamidis 61 MG CAPS TAKE 1 CAPSULE (61 MG) BY MOUTH DAILY. PLEASE DISCONTINUE VYNDAQEL. 10/04/20 10/04/21  Bensimhon, Shaune Pascal, MD  warfarin (COUMADIN) 5 MG tablet Take 2 tablets by mouth daily as directed by the coumadin clinic 07/23/21   Lorretta Harp, MD    Allergies    Peanut-containing drug products, Penicillins, and Decadron [dexamethasone]  Review of Systems   Review of Systems  HENT:  Positive for congestion and ear pain.   Respiratory:  Positive for cough.    Physical Exam Updated Vital Signs BP (!) 117/100 (BP Location: Left Arm)    Pulse 88    Temp 98.4 F (36.9 C) (Oral)    Resp 18    SpO2 100%   Physical Exam Vitals and nursing note reviewed.  Constitutional:      General: He is not in acute distress.    Appearance: He is well-developed.     Comments: nontoxic  HENT:     Head: Normocephalic and atraumatic.     Left Ear: Tympanic membrane is injected, erythematous and bulging.     Ears:     Comments: Left TM erythematous and  bulging consistent with AOM.  There is a small area of skin irritation of the left ear canal that is not actively bleeding.  No obvious perforation. Right ear canal and TM normal.    Nose: Mucosal edema and congestion present.     Mouth/Throat:     Pharynx: Uvula midline. No oropharyngeal exudate or posterior oropharyngeal erythema.  Eyes:     Extraocular Movements: Extraocular movements intact.  Cardiovascular:     Rate and Rhythm: Normal rate.  Pulmonary:     Effort: Pulmonary effort is normal.  Abdominal:     General: There is no distension.  Musculoskeletal:        General: Normal range of motion.     Cervical back: Normal range of motion.  Skin:    General: Skin is warm.     Findings: No rash.  Neurological:     Mental Status: He is alert and oriented to person, place, and time.    ED Results / Procedures / Treatments   Labs (all labs ordered are listed, but only abnormal results are displayed) Labs Reviewed  RESP PANEL BY RT-PCR (FLU A&B, COVID) [id number]    EKG None  Radiology No results found.  Procedures Procedures   Medications Ordered in ED Medications  cefdinir (OMNICEF) capsule 300 mg (has no administration in time range)    ED Course  I have reviewed the triage vital signs and the nursing notes.  Pertinent labs & imaging results that were available during my care of the patient were reviewed by me and considered in my medical decision making (see chart for details).    MDM Rules/Calculators/A&P                           Patient presenting for evaluation of left ear pain.  On exam, patient appears nontoxic.  Exam is consistent with acute left AOM.  He does have a small area of irritation of the skin of the ear canal which looks like it might of blood, but is no longer bleeding.  This is likely due to him being on  warfarin.  Discussed with patient.  Discussed that he is not to use anything in his ears including a Q-tip.  Will treat AOM with antibiotics.   While he does have a reported allergy to penicillins of convulsions, discussed with pharmacy.  Recommend cefdinir, is safe to use with warfarin and has low risk for concurrent allergic reaction.  First dose given in the ER, patient observed without reaction.  At this time, patient appears safe for discharge.  Return precautions given.  Patient states he understands and agrees to plan.  Final Clinical Impression(s) / ED Diagnoses Final diagnoses:  Acute suppurative otitis media of left ear without spontaneous rupture of tympanic membrane, recurrence not specified    Rx / DC Orders ED Discharge Orders          Ordered    cefdinir (OMNICEF) 300 MG capsule  2 times daily        09/11/21 1737             Franchot Heidelberg, PA-C 09/11/21 1744    Truddie Hidden, MD 09/11/21 1819

## 2021-09-17 ENCOUNTER — Ambulatory Visit (INDEPENDENT_AMBULATORY_CARE_PROVIDER_SITE_OTHER): Payer: Medicare Other

## 2021-09-17 ENCOUNTER — Other Ambulatory Visit: Payer: Self-pay

## 2021-09-17 DIAGNOSIS — I2699 Other pulmonary embolism without acute cor pulmonale: Secondary | ICD-10-CM | POA: Diagnosis not present

## 2021-09-17 DIAGNOSIS — Z7901 Long term (current) use of anticoagulants: Secondary | ICD-10-CM | POA: Diagnosis not present

## 2021-09-17 LAB — POCT INR: INR: 2.6 (ref 2.0–3.0)

## 2021-09-17 NOTE — Patient Instructions (Signed)
Continue taking 2 tablets (10mg ) daily, except Monday, Wednesday and Friday 1 tablet. Recheck INR 6 weeks. Coumadin Clinic [phone]

## 2021-09-20 ENCOUNTER — Other Ambulatory Visit: Payer: Self-pay | Admitting: Cardiovascular Disease

## 2021-09-21 ENCOUNTER — Other Ambulatory Visit (HOSPITAL_COMMUNITY): Payer: Self-pay

## 2021-09-21 ENCOUNTER — Telehealth (HOSPITAL_COMMUNITY): Payer: Self-pay | Admitting: Pharmacist

## 2021-09-21 MED ORDER — TAFAMIDIS 61 MG PO CAPS
1.0000 | ORAL_CAPSULE | Freq: Every day | ORAL | 11 refills | Status: DC
  Filled 2021-09-21: qty 30, fill #0
  Filled 2021-09-26: qty 30, 30d supply, fill #0
  Filled 2021-10-29: qty 30, 30d supply, fill #1
  Filled 2021-12-03: qty 30, 30d supply, fill #2
  Filled 2021-12-27: qty 30, 30d supply, fill #3
  Filled 2022-01-29: qty 30, 30d supply, fill #4
  Filled 2022-02-21: qty 30, 30d supply, fill #5
  Filled 2022-03-25: qty 30, 30d supply, fill #6
  Filled 2022-04-26: qty 30, 30d supply, fill #7
  Filled 2022-05-23: qty 30, 30d supply, fill #8
  Filled 2022-06-26: qty 30, 30d supply, fill #9
  Filled 2022-07-22: qty 30, 30d supply, fill #10
  Filled 2022-08-21: qty 30, 30d supply, fill #11

## 2021-09-21 NOTE — Telephone Encounter (Signed)
Patient Advocate Encounter   Received notification from [employer] that prior authorization for Vyndamax is required.   PA submitted on CoverMyMeds [place] [facility] Status is pending   Will continue to follow.   Audry Riles, PharmD, BCPS, BCCP, CPP Heart Failure Clinic Pharmacist [phone]

## 2021-09-21 NOTE — Telephone Encounter (Signed)
Advanced Heart Failure Patient Advocate Encounter  Prior Authorization for Jonathan Dickson has been approved.    PA# [id number] Effective dates: 09/21/21 through 09/29/22  Audry Riles, PharmD, BCPS, BCCP, CPP Heart Failure Clinic Pharmacist [phone]

## 2021-09-25 ENCOUNTER — Ambulatory Visit (INDEPENDENT_AMBULATORY_CARE_PROVIDER_SITE_OTHER): Payer: Medicare Other

## 2021-09-25 DIAGNOSIS — I428 Other cardiomyopathies: Secondary | ICD-10-CM | POA: Diagnosis not present

## 2021-09-25 LAB — CUP PACEART REMOTE DEVICE CHECK
Battery Remaining Longevity: 71 mo
Battery Remaining Percentage: 69 %
Battery Voltage: 2.98 V
Brady Statistic RV Percent Paced: 1 %
Date Time Interrogation Session: 20221227030833
HighPow Impedance: 68 Ohm
HighPow Impedance: 68 Ohm
Implantable Lead Implant Date: 20190918
Implantable Lead Location: 753860
Implantable Lead Model: 7122
Implantable Pulse Generator Implant Date: 20190918
Lead Channel Impedance Value: 440 Ohm
Lead Channel Pacing Threshold Amplitude: 0.5 V
Lead Channel Pacing Threshold Pulse Width: 0.5 ms
Lead Channel Sensing Intrinsic Amplitude: 12 mV
Lead Channel Setting Pacing Amplitude: 2.5 V
Lead Channel Setting Pacing Pulse Width: 0.5 ms
Lead Channel Setting Sensing Sensitivity: 0.5 mV
Pulse Gen Serial Number: 9796641

## 2021-09-26 ENCOUNTER — Other Ambulatory Visit (HOSPITAL_COMMUNITY): Payer: Self-pay

## 2021-09-26 ENCOUNTER — Other Ambulatory Visit: Payer: Self-pay | Admitting: Cardiovascular Disease

## 2021-09-26 ENCOUNTER — Other Ambulatory Visit: Payer: Self-pay | Admitting: Internal Medicine

## 2021-09-26 NOTE — Telephone Encounter (Signed)
Prescription refill request received for warfarin Lov: 06/07/21 [facility])  Next INR check: 10/29/21 Warfarin tablet strength: 5mg   Appropriate dose and refill sent to requested pharmacy.

## 2021-09-26 NOTE — Telephone Encounter (Signed)
Prescription refill request received for warfarin Lov: 06/07/21 [facility])  Next INR check: 10/29/21 Warfarin tablet strength: 5mg  Appropriate dose and refill sent to requested pharmacy.

## 2021-10-02 ENCOUNTER — Other Ambulatory Visit: Payer: Self-pay | Admitting: Cardiovascular Disease

## 2021-10-04 ENCOUNTER — Other Ambulatory Visit (HOSPITAL_COMMUNITY): Payer: Self-pay

## 2021-10-04 NOTE — Progress Notes (Signed)
Remote ICD transmission.   

## 2021-10-16 ENCOUNTER — Other Ambulatory Visit: Payer: Self-pay

## 2021-10-16 ENCOUNTER — Encounter: Payer: Self-pay | Admitting: Podiatry

## 2021-10-16 ENCOUNTER — Ambulatory Visit (INDEPENDENT_AMBULATORY_CARE_PROVIDER_SITE_OTHER): Payer: Medicare Other | Admitting: Podiatry

## 2021-10-16 DIAGNOSIS — M79675 Pain in left toe(s): Secondary | ICD-10-CM

## 2021-10-16 DIAGNOSIS — B351 Tinea unguium: Secondary | ICD-10-CM

## 2021-10-16 DIAGNOSIS — D689 Coagulation defect, unspecified: Secondary | ICD-10-CM

## 2021-10-16 DIAGNOSIS — M79674 Pain in right toe(s): Secondary | ICD-10-CM | POA: Diagnosis not present

## 2021-10-16 DIAGNOSIS — I739 Peripheral vascular disease, unspecified: Secondary | ICD-10-CM

## 2021-10-16 DIAGNOSIS — E119 Type 2 diabetes mellitus without complications: Secondary | ICD-10-CM

## 2021-10-16 NOTE — Progress Notes (Signed)
This patient returns to my office for at risk foot care.  This patient requires this care by a professional since this patient will be at risk due to having coagulation defect, diabetes and peripheral claudication.  Patient is taking coumadin.  This patient is unable to cut nails himself since the patient cannot reach his nails.These nails are painful walking and wearing shoes.  This patient presents for at risk foot care today.  General Appearance  Alert, conversant and in no acute stress.  Vascular  Dorsalis pedis and posterior tibial  pulses are weakly palpable  bilaterally.  Capillary return is within normal limits  bilaterally. Cold feet bilaterally. Absent digital hair.  Neurologic  Senn-Weinstein monofilament wire test within normal limits  bilaterally. Muscle power within normal limits bilaterally.  Nails Thick disfigured discolored nails with subungual debris  from hallux to fifth toes bilaterally. No evidence of bacterial infection or drainage bilaterally.  Orthopedic  No limitations of motion  feet .  No crepitus or effusions noted.  No bony pathology or digital deformities noted.  Skin  normotropic skin with no porokeratosis noted bilaterally.  No signs of infections or ulcers noted.     Onychomycosis  Pain in right toes  Pain in left toes  Consent was obtained for treatment procedures.   Mechanical debridement of nails 1-5  bilaterally performed with a nail nipper.  Filed with dremel without incident.    Return office visit   3  months                   Told patient to return for periodic foot care and evaluation due to potential at risk complications.   Kealii Thueson DPM  

## 2021-10-21 ENCOUNTER — Other Ambulatory Visit: Payer: Self-pay | Admitting: Internal Medicine

## 2021-10-21 DIAGNOSIS — E119 Type 2 diabetes mellitus without complications: Secondary | ICD-10-CM

## 2021-10-21 NOTE — Telephone Encounter (Signed)
Requested medication (s) are due for refill today: yes  Requested medication (s) are on the active medication list: yes  Last refill:  04/26/21 #30 5 RF  Future visit scheduled: yes  Notes to clinic:  overdue lab work   Requested Prescriptions  Pending Prescriptions Disp Refills   metFORMIN (GLUCOPHAGE) 500 MG tablet [Pharmacy Med Name: METFORMIN 500MG TABLETS] 30 tablet 5    Sig: TAKE 1 TABLET BY [place]     Endocrinology:  Diabetes - Biguanides Failed - 10/21/2021  3:37 AM      Failed - Cr in normal range and within 360 days    Creat  Date Value Ref Range Status  03/21/2016 0.98 0.70 - 1.25 mg/dL Final    Comment:      For patients > or = 67 years of age: The upper reference limit for Creatinine is approximately 13% higher for people identified as African-American.      Creatinine, Ser  Date Value Ref Range Status  08/17/2021 1.25 (H) 0.61 - 1.24 mg/dL Final          Failed - HBA1C is between 0 and 7.9 and within 180 days    HbA1c, POC (controlled diabetic range)  Date Value Ref Range Status  10/30/2020 6.4 0.0 - 7.0 % Final   Hgb A1c MFr Bld  Date Value Ref Range Status  04/10/2021 7.4 (H) 4.8 - 5.6 % Final    Comment:    (NOTE) Pre diabetes:          5.7%-6.4%  Diabetes:              >6.4%  Glycemic control for   <7.0% adults with diabetes           Failed - AA eGFR in normal range and within 360 days    GFR, Est African American  Date Value Ref Range Status  10/04/2015 >89 >=60 mL/min Final   GFR calc Af Amer  Date Value Ref Range Status  07/21/2020 62 >59 mL/min/1.73 Final    Comment:    **In accordance with recommendations from the NKF-ASN Task force,**   [facility] is in the process of updating its eGFR calculation to the   2021 CKD-EPI creatinine equation that estimates kidney function   without a race variable.    GFR, Est Non African American  Date Value Ref Range Status  10/04/2015 >89 >=60 mL/min Final    Comment:       The estimated GFR is a calculation valid for adults (>=74 years old) that uses the CKD-EPI algorithm to adjust for age and sex. It is   not to be used for children, pregnant women, hospitalized patients,    patients on dialysis, or with rapidly changing kidney function. According to the NKDEP, eGFR >89 is normal, 60-89 shows mild impairment, 30-59 shows moderate impairment, 15-29 shows severe impairment and <15 is ESRD.      GFR, Estimated  Date Value Ref Range Status  08/17/2021 >60 >60 mL/min Final    Comment:    (NOTE) Calculated using the CKD-EPI Creatinine Equation (2021)    eGFR  Date Value Ref Range Status  04/04/2021 66 >59 mL/min/1.73 Final          Passed - Valid encounter within last 6 months    Recent Outpatient Visits           3 months ago Type 2 diabetes mellitus without complication, without long-term current use of insulin ([place])   [facility]  [place], [place], [place]   5 months ago Patient left before evaluation by [place] Ladell Pier, MD   7 months ago Encounter for [employer] annual wellness exam   [place] Ladell Pier, MD   7 months ago Interstitial pulmonary fibrosis [facility])   [place] Elsie Stain, MD   9 months ago 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

## 2021-10-24 ENCOUNTER — Other Ambulatory Visit: Payer: Self-pay | Admitting: Internal Medicine

## 2021-10-24 DIAGNOSIS — E119 Type 2 diabetes mellitus without complications: Secondary | ICD-10-CM

## 2021-10-24 NOTE — Telephone Encounter (Signed)
Requested medication (s) are due for refill today:   Prescribed today by Dr. Wynetta Emery  Requested medication (s) are on the active medication list:   Yes  Future visit scheduled:   Yes in 2 wks with Levada Dy   Last ordered: Today 10/24/2021  Returned because a 90 day supply is being requested.   Requested Prescriptions  Pending Prescriptions Disp Refills   metFORMIN (GLUCOPHAGE) 500 MG tablet [Pharmacy Med Name: METFORMIN 500MG TABLETS] 90 tablet     Sig: TAKE 1 TABLET BY MOUTH DAILY WITH BREAKFAST     Endocrinology:  Diabetes - Biguanides Failed - 10/24/2021  9:42 AM      Failed - Cr in normal range and within 360 days    Creat  Date Value Ref Range Status  03/21/2016 0.98 0.70 - 1.25 mg/dL Final    Comment:      For patients > or = 67 years of age: The upper reference limit for Creatinine is approximately 13% higher for people identified as African-American.      Creatinine, Ser  Date Value Ref Range Status  08/17/2021 1.25 (H) 0.61 - 1.24 mg/dL Final          Failed - HBA1C is between 0 and 7.9 and within 180 days    HbA1c, POC (controlled diabetic range)  Date Value Ref Range Status  10/30/2020 6.4 0.0 - 7.0 % Final   Hgb A1c MFr Bld  Date Value Ref Range Status  04/10/2021 7.4 (H) 4.8 - 5.6 % Final    Comment:    (NOTE) Pre diabetes:          5.7%-6.4%  Diabetes:              >6.4%  Glycemic control for   <7.0% adults with diabetes           Failed - AA eGFR in normal range and within 360 days    GFR, Est African American  Date Value Ref Range Status  10/04/2015 >89 >=60 mL/min Final   GFR calc Af Amer  Date Value Ref Range Status  07/21/2020 62 >59 mL/min/1.73 Final    Comment:    **In accordance with recommendations from the NKF-ASN Task force,**   [facility] is in the process of updating its eGFR calculation to the   2021 CKD-EPI creatinine equation that estimates kidney function   without a race variable.    GFR, Est Non African American  Date  Value Ref Range Status  10/04/2015 >89 >=60 mL/min Final    Comment:      The estimated GFR is a calculation valid for adults (>=41 years old) that uses the CKD-EPI algorithm to adjust for age and sex. It is   not to be used for children, pregnant women, hospitalized patients,    patients on dialysis, or with rapidly changing kidney function. According to the NKDEP, eGFR >89 is normal, 60-89 shows mild impairment, 30-59 shows moderate impairment, 15-29 shows severe impairment and <15 is ESRD.      GFR, Estimated  Date Value Ref Range Status  08/17/2021 >60 >60 mL/min Final    Comment:    (NOTE) Calculated using the CKD-EPI Creatinine Equation (2021)    eGFR  Date Value Ref Range Status  04/04/2021 66 >59 mL/min/1.73 Final          Passed - Valid encounter within last 6 months    Recent Outpatient Visits           3 months ago Type  2 diabetes mellitus without complication, without long-term current use of insulin [facility])   [place], [place], [place]   5 months ago Patient left before evaluation by [place] Ladell Pier, MD   7 months ago Encounter for [employer] annual wellness exam   [place] Ladell Pier, MD   7 months ago Interstitial pulmonary fibrosis [facility])   [place] Elsie Stain, MD   9 months ago 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

## 2021-10-29 ENCOUNTER — Ambulatory Visit (INDEPENDENT_AMBULATORY_CARE_PROVIDER_SITE_OTHER): Payer: Medicare Other

## 2021-10-29 ENCOUNTER — Other Ambulatory Visit: Payer: Self-pay

## 2021-10-29 ENCOUNTER — Other Ambulatory Visit (HOSPITAL_COMMUNITY): Payer: Self-pay

## 2021-10-29 DIAGNOSIS — Z7901 Long term (current) use of anticoagulants: Secondary | ICD-10-CM | POA: Diagnosis not present

## 2021-10-29 DIAGNOSIS — I2699 Other pulmonary embolism without acute cor pulmonale: Secondary | ICD-10-CM | POA: Diagnosis not present

## 2021-10-29 LAB — POCT INR: INR: 3.9 — AB (ref 2.0–3.0)

## 2021-10-29 NOTE — Patient Instructions (Signed)
HOLD Tuesday ONLY and then Continue taking 2 tablets (10mg ) daily, except Monday, Wednesday and Friday 1 tablet. Recheck INR 3 weeks. Coumadin Clinic [phone]

## 2021-11-02 ENCOUNTER — Other Ambulatory Visit (HOSPITAL_COMMUNITY): Payer: Self-pay

## 2021-11-06 ENCOUNTER — Emergency Department (HOSPITAL_COMMUNITY)
Admission: EM | Admit: 2021-11-06 | Discharge: 2021-11-06 | Disposition: A | Discharge status: Home / Self Care | Payer: Medicare Other | Attending: Emergency Medicine | Admitting: Emergency Medicine

## 2021-11-06 ENCOUNTER — Other Ambulatory Visit: Payer: Self-pay

## 2021-11-06 ENCOUNTER — Other Ambulatory Visit (HOSPITAL_COMMUNITY): Payer: Self-pay

## 2021-11-06 ENCOUNTER — Emergency Department (HOSPITAL_COMMUNITY): Payer: Medicare Other

## 2021-11-06 DIAGNOSIS — M25551 Pain in right hip: Secondary | ICD-10-CM | POA: Diagnosis not present

## 2021-11-06 DIAGNOSIS — M1990 Unspecified osteoarthritis, unspecified site: Secondary | ICD-10-CM | POA: Insufficient documentation

## 2021-11-06 DIAGNOSIS — E119 Type 2 diabetes mellitus without complications: Secondary | ICD-10-CM | POA: Insufficient documentation

## 2021-11-06 DIAGNOSIS — Z7984 Long term (current) use of oral hypoglycemic drugs: Secondary | ICD-10-CM | POA: Insufficient documentation

## 2021-11-06 DIAGNOSIS — M1611 Unilateral primary osteoarthritis, right hip: Secondary | ICD-10-CM | POA: Diagnosis not present

## 2021-11-06 DIAGNOSIS — Z91010 Allergy to peanuts: Secondary | ICD-10-CM | POA: Insufficient documentation

## 2021-11-06 MED ORDER — OXYCODONE-ACETAMINOPHEN 5-325 MG PO TABS
1.0000 | ORAL_TABLET | Freq: Once | ORAL | Status: AC
  Administered 2021-11-06: 1 via ORAL
  Filled 2021-11-06: qty 1

## 2021-11-06 MED ORDER — OXYCODONE-ACETAMINOPHEN 5-325 MG PO TABS: 1.0000 | ORAL_TABLET | Freq: Four times a day (QID) | ORAL | 0 refills | Status: DC | PRN

## 2021-11-06 NOTE — Discharge Instructions (Addendum)
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 I have sent you in a few tablets for pain relief, but you can also continue taking Tylenol as needed as well since you are unable to take NSAIDs such as ibuprofen. You may also need a joint injection in that hip to help with inflammation.

## 2021-11-06 NOTE — ED Triage Notes (Signed)
Pt reports ongoing right hip pain x 2 days. Pt reports then right knee gave up. Pt denies any injury.

## 2021-11-06 NOTE — ED Provider Notes (Signed)
[facility] EMERGENCY DEPARTMENT Provider Note   CSN: [id number] Arrival date & time: 11/06/21  0600     History  Chief Complaint  Patient presents with   Hip Pain    Jonathan Dickson is a 66 y.o. male with history of AICD, diabetes, cervical disc disease who presents to the ED for right-sided hip pain that started a few days ago.  Patient states pain radiates down from the hip throughout the entire foot.  He states his knee gave out earlier due to pain of the hip, but he denies any trauma or injury.  No saddle paresthesia, bladder or bowel incontinence.  No numbness or tingling.  He has been using muscle relaxers, heating pad and Tylenol arthritis without relief.  Patient is concerned that he has arthritis but is also afraid that he may have COVID so we will   Hip Pain Pertinent negatives include no abdominal pain, no headaches and no shortness of breath.      Home Medications Prior to Admission medications   Medication Sig Start Date End Date Taking? Authorizing Provider  oxyCODONE-acetaminophen (PERCOCET/ROXICET) 5-325 MG tablet Take 1 tablet by mouth every 6 (six) hours as needed for severe pain. 11/06/21  Yes Kathe Becton R, PA-C  warfarin (COUMADIN) 5 MG tablet TAKE 2 TABLETS BY MOUTH DAILY AS DIRECTED BY COUMADIN CLINIC Patient taking differently: Take 5-10 mg by mouth See admin instructions. 5mg  once daily on Monday's, Wednesday's, and Friday's, and 10mg  once daily all other days of the week 09/26/21   Pixie Casino, MD  acetaminophen (TYLENOL) 500 MG tablet Take 1,000 mg by mouth every 6 (six) hours as needed for moderate pain or headache.    [provider]  amitriptyline (ELAVIL) 25 MG tablet TAKE 1 TABLET(25 MG) BY MOUTH AT BEDTIME AS NEEDED FOR SLEEP Patient taking differently: Take 25 mg by mouth at bedtime as needed for sleep. 06/15/20   Ladell Pier, MD  Aromatic Inhalants (VICKS VAPOINHALER) INHA Inhale 1 Dose into the lungs daily as  needed (congestion).    [provider]  atorvastatin (LIPITOR) 80 MG tablet TAKE 1 TABLET BY MOUTH EVERY DAY Patient taking differently: Take 80 mg by mouth daily. 10/02/21   Lorretta Harp, MD  carvedilol (COREG) 12.5 MG tablet Take 1 tablet (12.5 mg total) by mouth 2 (two) times daily with a meal. 08/20/21   Milford, Maricela Bo, FNP  Chlorphen-Phenyleph-ASA (ALKA-SELTZER PLUS COLD PO) Take 1-2 tablets by mouth 2 (two) times daily as needed (cold symptoms).     [provider]  Cholecalciferol (VITAMIN D) 50 MCG (2000 UT) tablet Take 2,000 Units by mouth daily.    [provider]  diclofenac Sodium (VOLTAREN) 1 % GEL Apply 2 g topically 4 (four) times daily as needed (pain).    [provider]  FARXIGA 10 MG TABS tablet TAKE 1 TABLET(10 MG) BY MOUTH DAILY Patient taking differently: Take 10 mg by mouth daily. 05/22/21   Bensimhon, Shaune Pascal, MD  furosemide (LASIX) 40 MG tablet Take 1 tablet (40 mg total) by mouth daily as needed. For fluid (if weight gain more than 3 lbs in 1 day) Patient taking differently: Take 40 mg by mouth daily as needed for fluid (weight gain of more than 3lbs in 1 day). 08/27/21   Milford, Maricela Bo, FNP  HYDROcodone-acetaminophen (NORCO/VICODIN) 5-325 MG tablet Take 1 tablet by mouth every 6 (six) hours as needed for moderate pain or severe pain. Patient not taking:  Reported on 06/27/2021 04/19/21 04/19/22  Justice Britain, PA-C  metFORMIN (GLUCOPHAGE) 500 MG tablet TAKE 1 TABLET BY MOUTH DAILY WITH BREAKFAST Patient taking differently: Take 500 mg by mouth daily with breakfast. 10/24/21   Ladell Pier, MD  methocarbamol (ROBAXIN) 500 MG tablet Take 1 tablet (500 mg total) by mouth every 6 (six) hours as needed for muscle spasms. 04/19/21   McKenzie, Lennie Muckle, PA-C  sacubitril-valsartan (ENTRESTO) 49-51 MG Take 1 tablet by mouth 2 (two) times daily. 07/02/21   Milford, Maricela Bo, FNP  spironolactone (ALDACTONE) 25 MG tablet TAKE 1/2  TABLET(12.5 MG) BY MOUTH DAILY Patient taking differently: Take 12.5 mg by mouth daily. 03/19/21   Bensimhon, Shaune Pascal, MD  Tafamidis 61 MG CAPS Take 1 capsule by mouth daily. Patient taking differently: Take 61 mg by mouth daily. 09/21/21   Bensimhon, Shaune Pascal, MD      Allergies    Peanut-containing drug products, Penicillins, and Decadron [dexamethasone]    Review of Systems   Review of Systems  Constitutional:  Negative for fever.  HENT: Negative.    Eyes: Negative.   Respiratory:  Negative for shortness of breath.   Cardiovascular: Negative.   Gastrointestinal:  Negative for abdominal pain and vomiting.  Endocrine: Negative.   Genitourinary: Negative.   Musculoskeletal:  Positive for arthralgias and gait problem.  Skin:  Negative for rash.  Neurological:  Negative for headaches.  All other systems reviewed and are negative.  Physical Exam Updated Vital Signs BP (!) 155/88 (BP Location: Right Arm)    Pulse 79    Temp 97.8 F (36.6 C) (Oral)    Resp 19    SpO2 99%  Physical Exam Vitals and nursing note reviewed.  Constitutional:      General: He is not in acute distress.    Appearance: He is not ill-appearing.  HENT:     Head: Atraumatic.  Eyes:     Conjunctiva/sclera: Conjunctivae normal.  Cardiovascular:     Rate and Rhythm: Normal rate and regular rhythm.     Pulses: Normal pulses.     Heart sounds: No murmur heard. Pulmonary:     Effort: Pulmonary effort is normal. No respiratory distress.     Breath sounds: Normal breath sounds.  Abdominal:     General: Abdomen is flat. There is no distension.     Palpations: Abdomen is soft.     Tenderness: There is no abdominal tenderness.  Musculoskeletal:        General: Normal range of motion.     Cervical back: Normal range of motion.     Comments: T-spine and L-spine nontender to palpation at midline. Patient has 2+ DP pulses bilaterally.  No swelling or erythema of either knee.  Full range of motion of both knees.  He  does have significant pain during passive hip flexion causing cramping of his leg. All other joints supple and easily movable, no erythema, swelling or palpable deformity, all compartments soft.   Skin:    General: Skin is warm and dry.     Capillary Refill: Capillary refill takes less than 2 seconds.  Neurological:     General: No focal deficit present.     Mental Status: He is alert.  Psychiatric:        Mood and Affect: Mood normal.    ED Results / Procedures / Treatments   Labs (all labs ordered are listed, but only abnormal results are displayed) Labs Reviewed - No data to display  EKG None  Radiology DG Hip Unilat W or Wo Pelvis 2-3 Views Right  Result Date: 11/06/2021 CLINICAL DATA:  Right hip pain for 2 days EXAM: DG HIP (WITH OR WITHOUT PELVIS) 2-3V RIGHT COMPARISON:  None. FINDINGS: Frontal view of the pelvis as well as frontal and frogleg lateral views of the right hip are obtained. No fracture, subluxation, or dislocation. Mild symmetrical bilateral hip osteoarthritis. The sacroiliac joints are unremarkable. Soft tissues are normal. IMPRESSION: 1. Mild symmetrical bilateral hip osteoarthritis. No acute fracture. Electronically Signed   By: Randa Ngo M.D.   On: 11/06/2021 20:11    Procedures Procedures    Medications Ordered in ED Medications  oxyCODONE-acetaminophen (PERCOCET/ROXICET) 5-325 MG per tablet 1 tablet (1 tablet Oral Given 11/06/21 0636)  oxyCODONE-acetaminophen (PERCOCET/ROXICET) 5-325 MG per tablet 1 tablet (1 tablet Oral Given 11/06/21 1929)  oxyCODONE-acetaminophen (PERCOCET/ROXICET) 5-325 MG per tablet 1 tablet (1 tablet Oral Given 11/06/21 2207)    ED Course/ Medical Decision Making/ A&P                           Medical Decision Making  History:  Per HPI  Initial impression:  This patient presents to the ED for concern of Right hip pain, this involves an extensive number of treatment options, and is a complaint that carries with it a high  risk of complications and morbidity.   Differentials include fracture, labrum tear, bursitis, arthritis, avascular necrosis, septic joint  ED Course: Patient is nontoxic appearing, no acute distress.  He is ambulatory with a cane.  X-ray returned with moderate arthritis of the hip joints bilaterally.  No obvious trauma or palpable deformities. He did have significant pain and muscle cramping upon hip flexion.  He was given Percocet with symptomatic improvement.  Vitals and exam are otherwise reassuring.  Imaging Studies ordered:  I ordered imaging studies including bilateral hip x-ray I independently visualized and interpreted imaging and I agree with the radiologist interpretation.    Medicines ordered and prescription drug management:  I ordered medication including: Percocet for pain Reevaluation of the patient after these medicines showed that the patient improved I have reviewed the patients home medicines and have made adjustments as needed  Disposition:  After consideration of the diagnostic results, physical exam, history and the patients response to treatment feel that the patent would benefit from discharge with outpatient follow-up.   Arthritis: Advised patient to control his pain with Tylenol.  I informed him that NSAIDs were better, however he states he is unable to take this due to some other medication interactions.  I advised him to follow-up with his primary care doctor who may recommend that he get a joint injection or may require an MRI to further evaluate the tendons and soft tissue.  I have given him a few tablets of pain medication until he can see his PCP.  All questions asked and answered.  Discharged home in good condition.    Final Clinical Impression(s) / ED Diagnoses Final diagnoses:  Right hip pain  Arthritis    Rx / DC Orders ED Discharge Orders          Ordered    oxyCODONE-acetaminophen (PERCOCET/ROXICET) 5-325 MG tablet  Every 6 hours PRN         11/06/21 2200              Tonye Pearson, PA-C 11/06/21 2252    Drenda Freeze, MD 11/08/21 1328

## 2021-11-06 NOTE — ED Provider Triage Note (Signed)
Emergency Medicine Provider Triage Evaluation Note  Jonathan Dickson , a 66 y.o. male  was evaluated in triage.  Pt complains of right leg pain.  States starts in low back and radiates down to the buttock and into right leg.  States knee gave out earlier today.  No head injury or LOC.  Denies any specific injuries/falls prior to pain starting.  No focal numbness or weakness of the legs.  No bowel or bladder incontinence.  Has been using muscle relaxers, heating pad, and BC arthritis without relief..  Review of Systems  Positive: Back pain, leg pain Negative: fever  Physical Exam  BP (!) 150/98 (BP Location: Right Arm)    Pulse 82    Temp (!) 97.5 F (36.4 C) (Oral)    Resp 17    SpO2 98%   Gen:   Awake, no distress   Resp:  Normal effort  MSK:   Moves extremities without difficulty  Other:  Tender along right SI joint, no deformity noted, ambulatory with cane in triage  Medical Decision Making  Medically screening exam initiated at 6:22 AM.  Appropriate orders placed.  [name] was informed that the remainder of the evaluation will be completed by another provider, this initial triage assessment does not replace that evaluation, and the importance of remaining in the ED until their evaluation is complete.  Right back and leg pain.  No focal deficits currently in triage.  Suspect radiculopathy/sciatica.  Given percocet for pain.   [name], PA-C 11/06/21 0630

## 2021-11-07 NOTE — Progress Notes (Signed)
Patient ID: [name], male   DOB: [birth year], 66 y.o.   MRN: [id number]   Jonathan Dickson, is a 66 y.o. male  AYO:[id number]  ELT:[id number]  DOB - 11-24-1955  Chief Complaint  Patient presents with   Diabetes       Subjective:   Jonathan Dickson is a 66 y.o. male here today for a follow up visit Seen at the ED 11/06/2021 for B hip pain.  Needs referral to ortho.  No other issues or concerns  From ED note: Jonathan Dickson is a 66 y.o. male with history of AICD, diabetes, cervical disc disease who presents to the ED for right-sided hip pain that started a few days ago.  Patient states pain radiates down from the hip throughout the entire foot.  He states his knee gave out earlier due to pain of the hip, but he denies any trauma or injury.  No saddle paresthesia, bladder or bowel incontinence.  No numbness or tingling.  He has been using muscle relaxers, heating pad and Tylenol arthritis without relief.  Patient is concerned that he has arthritis but is also afraid that he may have COVI  Initial impression:   This patient presents to the ED for concern of Right hip pain, this involves an extensive number of treatment options, and is a complaint that carries with it a high risk of complications and morbidity.   Differentials include fracture, labrum tear, bursitis, arthritis, avascular necrosis, septic joint   ED Course: Patient is nontoxic appearing, no acute distress.  He is ambulatory with a cane.  X-ray returned with moderate arthritis of the hip joints bilaterally.  No obvious trauma or palpable deformities. He did have significant pain and muscle cramping upon hip flexion.  He was given Percocet with symptomatic improvement.  Vitals and exam are otherwise reassuring.   Imaging Studies ordered:   I ordered imaging studies including bilateral hip x-ray I independently visualized and interpreted imaging and I agree with the radiologist interpretation  Medicines ordered and prescription drug  management:   I ordered medication including: Percocet for pain Reevaluation of the patient after these medicines showed that the patient improved I have reviewed the patients home medicines and have made adjustments as needed   Disposition:   After consideration of the diagnostic results, physical exam, history and the patients response to treatment feel that the patent would benefit from discharge with outpatient follow-up.   Arthritis: Advised patient to control his pain with Tylenol.  I informed him that NSAIDs were better, however he states he is unable to take this due to some other medication interactions.  I advised him to follow-up with his primary care doctor who may recommend that he get a joint injection or may require an MRI to further evaluate the tendons and soft tissue.  I have given him a few tablets of pain medication until he can see his PCP.  All questions asked and answered.  Discharged home in good condition.   Patient has No headache, No chest pain, No abdominal pain - No Nausea, No new weakness tingling or numbness, No Cough - SOB.  No problems updated.  ALLERGIES: Allergies  Allergen Reactions   Peanut-Containing Drug Products Swelling   Penicillins Other (See Comments)    CONVULSIONS Did it involve swelling of the face/tongue/throat, SOB, or low BP? No Did it involve sudden or severe rash/hives, skin peeling, or any reaction on the inside of your mouth or nose? No Did you need to seek  medical attention at a hospital or doctor's office? Yes When did it last happen?      25 years If all above answers are NO, may proceed with cephalosporin use.    Decadron [Dexamethasone] Itching    PAST MEDICAL HISTORY: Past Medical History:  Diagnosis Date   Abnormal TSH 02/2016   AICD (automatic cardioverter/defibrillator) present    Anxiety    Cervical disc disease    Chronic combined systolic and diastolic CHF (congestive heart failure) ([place])    a. 01/2016 Echo: EF  25%, inf AK, diffuse sev HK, Gr1 DD, mild MR.   Depression    Diabetes mellitus without complication ([place])    Dyspnea    Dysrhythmia    History of hiatal hernia    Hyperglycemia    Hypertension    Hypertensive heart disease    Myocardial infarction [facility])    ??  maybe   NICM (nonischemic cardiomyopathy) ([place])    a. 01/2016 Echo: EF 25%, inf AK, diffuse sev HK, Gr1 DD, mild MR; b. 01/2016 MV: EF 22%, no isch/infarct;  c. 02/2016 Cath: Nl cors, EF 35-45%.   Pneumonia    Pre-diabetes    Seizures ([place])    with penicillin    MEDICATIONS AT HOME: Prior to Admission medications   Medication Sig Start Date End Date Taking? Authorizing Provider  acetaminophen (TYLENOL) 500 MG tablet Take 1,000 mg by mouth every 6 (six) hours as needed for moderate pain or headache.   Yes [provider]  amitriptyline (ELAVIL) 25 MG tablet TAKE 1 TABLET(25 MG) BY MOUTH AT BEDTIME AS NEEDED FOR SLEEP Patient taking differently: Take 25 mg by mouth at bedtime as needed for sleep. 06/15/20  Yes Ladell Pier, MD  Aromatic Inhalants (VICKS VAPOINHALER) INHA Inhale 1 Dose into the lungs daily as needed (congestion).   Yes [provider]  atorvastatin (LIPITOR) 80 MG tablet TAKE 1 TABLET BY MOUTH EVERY DAY Patient taking differently: Take 80 mg by mouth daily. 10/02/21  Yes Lorretta Harp, MD  carvedilol (COREG) 12.5 MG tablet Take 1 tablet (12.5 mg total) by mouth 2 (two) times daily with a meal. 08/20/21  Yes Milford, Jessica M, FNP  Chlorphen-Phenyleph-ASA (ALKA-SELTZER PLUS COLD PO) Take 1-2 tablets by mouth 2 (two) times daily as needed (cold symptoms).    Yes [provider]  Cholecalciferol (VITAMIN D) 50 MCG (2000 UT) tablet Take 2,000 Units by mouth daily.   Yes [provider]  cyclobenzaprine (FLEXERIL) 5 MG tablet Take 1 tablet (5 mg total) by mouth 3 (three) times daily as needed for muscle spasms. 11/08/21  Yes Argentina Donovan, PA-C  diclofenac Sodium (VOLTAREN) 1  % GEL Apply 2 g topically 4 (four) times daily as needed (pain).   Yes [provider]  FARXIGA 10 MG TABS tablet TAKE 1 TABLET(10 MG) BY MOUTH DAILY Patient taking differently: Take 10 mg by mouth daily. 05/22/21  Yes Bensimhon, Shaune Pascal, MD  furosemide (LASIX) 40 MG tablet Take 1 tablet (40 mg total) by mouth daily as needed. For fluid (if weight gain more than 3 lbs in 1 day) Patient taking differently: Take 40 mg by mouth daily as needed for fluid (weight gain of more than 3lbs in 1 day). 08/27/21  Yes Milford, Maricela Bo, FNP  HYDROcodone-acetaminophen (NORCO/VICODIN) 5-325 MG tablet Take 1 tablet by mouth every 6 (six) hours as needed for moderate pain or severe pain. 04/19/21 04/19/22 Yes McKenzie, Lennie Muckle, PA-C  methocarbamol (ROBAXIN) 500 MG  tablet Take 1 tablet (500 mg total) by mouth every 6 (six) hours as needed for muscle spasms. 04/19/21  Yes McKenzie, Lennie Muckle, PA-C  oxyCODONE-acetaminophen (PERCOCET/ROXICET) 5-325 MG tablet Take 1 tablet by mouth every 6 (six) hours as needed for severe pain. 11/06/21  Yes Conklin, Erica R, PA-C  sacubitril-valsartan (ENTRESTO) 49-51 MG Take 1 tablet by mouth 2 (two) times daily. 07/02/21  Yes Milford, Jessica M, FNP  spironolactone (ALDACTONE) 25 MG tablet TAKE 1/2 TABLET(12.5 MG) BY MOUTH DAILY Patient taking differently: Take 12.5 mg by mouth daily. 03/19/21  Yes Bensimhon, Shaune Pascal, MD  Tafamidis 61 MG CAPS Take 1 capsule by mouth daily. Patient taking differently: Take 61 mg by mouth daily. 09/21/21  Yes Bensimhon, Shaune Pascal, MD  warfarin (COUMADIN) 5 MG tablet TAKE 2 TABLETS BY MOUTH DAILY AS DIRECTED BY COUMADIN CLINIC Patient taking differently: Take 5-10 mg by mouth See admin instructions. 5mg  once daily on Monday's, Wednesday's, and Friday's, and 10mg  once daily all other days of the week 09/26/21  Yes Hilty, Nadean Corwin, MD  metFORMIN (GLUCOPHAGE) 500 MG tablet Take 1 tablet (500 mg total) by mouth 2 (two) times daily with a meal. 11/08/21    Talita Recht, Dionne Bucy, PA-C    ROS: Neg HEENT Neg resp Neg cardiac Neg GI Neg GU Neg psych Neg neuro  Objective:   Vitals:   11/08/21 1014  BP: 101/70  Pulse: 81  SpO2: 100%  Weight: 170 lb (77.1 kg)  Height: 5\' 6"  (1.676 m)   Exam General appearance : Awake, alert, not in any distress. Speech Clear. Not toxic looking HEENT: Atraumatic and Normocephalic Neck: Supple, no JVD. No cervical lymphadenopathy.  Chest: Good air entry bilaterally, CTAB.  No rales/rhonchi/wheezing CVS: S1 S2 regular, no murmurs.  Extremities: B/L Lower Ext shows no edema, both legs are warm to touch Neurology: Awake alert, and oriented X 3, CN II-XII intact, Non focal Skin: No Rash  Data Review Lab Results  Component Value Date   HGBA1C 6.8 (A) 11/08/2021   HGBA1C 7.4 (H) 04/10/2021   HGBA1C 6.4 10/30/2020    Assessment & Plan   1. Type 2 diabetes mellitus without complication, without long-term current use of insulin (HCC) Improved but would like A1C to be a little lower.  Increase metformin to bid - Glucose (CBG) - HgB A1c - metFORMIN (GLUCOPHAGE) 500 MG tablet; Take 1 tablet (500 mg total) by mouth 2 (two) times daily with a meal.  Dispense: 60 tablet; Refill: 2  2. Bilateral hip pain Tylenol per pkg - Ambulatory referral to Orthopedic Surgery - cyclobenzaprine (FLEXERIL) 5 MG tablet; Take 1 tablet (5 mg total) by mouth 3 (three) times daily as needed for muscle spasms.  Dispense: 30 tablet; Refill: 1  3. Encounter for examination following treatment at hospital    Patient have been counseled extensively about nutrition and exercise. Other issues discussed during this visit include: low cholesterol diet, weight control and daily exercise, foot care, annual eye examinations at Ophthalmology, importance of adherence with medications and regular follow-up. We also discussed long term complications of uncontrolled diabetes and hypertension.   Return in about 3 months (around 02/05/2022) for  PCP for chronic conditions.  The patient was given clear instructions to go to ER or return to medical center if symptoms don't improve, worsen or new problems develop. The patient verbalized understanding. The patient was told to call to get lab results if they haven't heard anything in the next week.  Freeman Caldron, PA-C [facility] [place], [place]   11/08/2021, 10:30 AM

## 2021-11-08 ENCOUNTER — Encounter: Payer: Self-pay | Admitting: Physician Assistant

## 2021-11-08 ENCOUNTER — Ambulatory Visit: Payer: Medicare Other | Attending: Physician Assistant | Admitting: Physician Assistant

## 2021-11-08 VITALS — BP 101/70 | HR 81 | Ht 66.0 in | Wt 170.0 lb

## 2021-11-08 DIAGNOSIS — M25552 Pain in left hip: Secondary | ICD-10-CM | POA: Diagnosis not present

## 2021-11-08 DIAGNOSIS — M25551 Pain in right hip: Secondary | ICD-10-CM | POA: Diagnosis not present

## 2021-11-08 DIAGNOSIS — E119 Type 2 diabetes mellitus without complications: Secondary | ICD-10-CM

## 2021-11-08 DIAGNOSIS — Z09 Encounter for follow-up examination after completed treatment for conditions other than malignant neoplasm: Secondary | ICD-10-CM

## 2021-11-08 LAB — POCT GLYCOSYLATED HEMOGLOBIN (HGB A1C): Hemoglobin A1C: 6.8 % — AB (ref 4.0–5.6)

## 2021-11-08 LAB — GLUCOSE, POCT (MANUAL RESULT ENTRY): POC Glucose: 102 mg/dl — AB (ref 70–99)

## 2021-11-08 MED ORDER — CYCLOBENZAPRINE HCL 5 MG PO TABS: 5.0000 mg | ORAL_TABLET | Freq: Three times a day (TID) | ORAL | 1 refills | Status: DC | PRN

## 2021-11-08 MED ORDER — METFORMIN HCL 500 MG PO TABS: 500.0000 mg | ORAL_TABLET | Freq: Two times a day (BID) | ORAL | 2 refills | Status: DC

## 2021-11-09 DIAGNOSIS — M25551 Pain in right hip: Secondary | ICD-10-CM | POA: Diagnosis not present

## 2021-11-13 ENCOUNTER — Other Ambulatory Visit (HOSPITAL_COMMUNITY): Payer: Self-pay | Admitting: Family Medicine

## 2021-11-13 DIAGNOSIS — M25559 Pain in unspecified hip: Secondary | ICD-10-CM

## 2021-11-16 DIAGNOSIS — M545 Low back pain, unspecified: Secondary | ICD-10-CM | POA: Diagnosis not present

## 2021-11-16 DIAGNOSIS — M25561 Pain in right knee: Secondary | ICD-10-CM | POA: Diagnosis not present

## 2021-11-19 ENCOUNTER — Telehealth (HOSPITAL_COMMUNITY): Payer: Self-pay

## 2021-11-19 ENCOUNTER — Ambulatory Visit (INDEPENDENT_AMBULATORY_CARE_PROVIDER_SITE_OTHER): Payer: Medicare Other

## 2021-11-19 ENCOUNTER — Telehealth (HOSPITAL_COMMUNITY): Payer: Self-pay | Admitting: Internal Medicine

## 2021-11-19 ENCOUNTER — Other Ambulatory Visit: Payer: Self-pay

## 2021-11-19 DIAGNOSIS — Z7901 Long term (current) use of anticoagulants: Secondary | ICD-10-CM

## 2021-11-19 DIAGNOSIS — I2699 Other pulmonary embolism without acute cor pulmonale: Secondary | ICD-10-CM | POA: Diagnosis not present

## 2021-11-19 LAB — POCT INR: INR: 6.4 — AB (ref 2.0–3.0)

## 2021-11-19 NOTE — Telephone Encounter (Signed)
[facility] is faxing a form to request a change of pharmacy, please advise

## 2021-11-19 NOTE — Patient Instructions (Signed)
HOLD Tuesday and Wednesday ONLY and then Continue taking 2 tablets (10mg ) daily, except Monday, Wednesday and Friday 1 tablet. Recheck INR 1 week. Coumadin Clinic [phone]

## 2021-11-19 NOTE — Telephone Encounter (Signed)
Left message for patient to contact the heart failure clinic back regarding his pharmacy details.

## 2021-11-21 ENCOUNTER — Other Ambulatory Visit (HOSPITAL_COMMUNITY): Payer: Self-pay | Admitting: *Deleted

## 2021-11-21 DIAGNOSIS — I428 Other cardiomyopathies: Secondary | ICD-10-CM

## 2021-11-21 DIAGNOSIS — I10 Essential (primary) hypertension: Secondary | ICD-10-CM

## 2021-11-21 DIAGNOSIS — I5022 Chronic systolic (congestive) heart failure: Secondary | ICD-10-CM

## 2021-11-21 MED ORDER — CARVEDILOL 12.5 MG PO TABS: 12.5000 mg | ORAL_TABLET | Freq: Two times a day (BID) | ORAL | 0 refills | Status: DC

## 2021-11-22 ENCOUNTER — Other Ambulatory Visit: Payer: Self-pay

## 2021-11-22 MED ORDER — ATORVASTATIN CALCIUM 80 MG PO TABS: 80.0000 mg | ORAL_TABLET | Freq: Every day | ORAL | 3 refills | Status: DC

## 2021-11-26 ENCOUNTER — Other Ambulatory Visit: Payer: Self-pay

## 2021-11-26 ENCOUNTER — Ambulatory Visit (INDEPENDENT_AMBULATORY_CARE_PROVIDER_SITE_OTHER): Payer: Medicare Other

## 2021-11-26 DIAGNOSIS — Z7901 Long term (current) use of anticoagulants: Secondary | ICD-10-CM

## 2021-11-26 DIAGNOSIS — I2699 Other pulmonary embolism without acute cor pulmonale: Secondary | ICD-10-CM | POA: Diagnosis not present

## 2021-11-26 LAB — POCT INR: INR: 2.9 (ref 2.0–3.0)

## 2021-11-26 MED ORDER — WARFARIN SODIUM 5 MG PO TABS: ORAL_TABLET | ORAL | 0 refills | Status: DC

## 2021-11-26 NOTE — Patient Instructions (Signed)
Continue taking 2 tablets (10mg ) daily, except Monday, Wednesday and Friday 1 tablet. Recheck INR 4 weeks. Coumadin Clinic [phone]

## 2021-11-29 ENCOUNTER — Other Ambulatory Visit (HOSPITAL_COMMUNITY): Payer: Self-pay

## 2021-11-30 ENCOUNTER — Other Ambulatory Visit (HOSPITAL_COMMUNITY): Payer: Self-pay

## 2021-12-03 ENCOUNTER — Other Ambulatory Visit (HOSPITAL_COMMUNITY): Payer: Self-pay

## 2021-12-06 ENCOUNTER — Other Ambulatory Visit (HOSPITAL_COMMUNITY): Payer: Self-pay

## 2021-12-07 ENCOUNTER — Other Ambulatory Visit (HOSPITAL_COMMUNITY): Payer: Self-pay

## 2021-12-12 ENCOUNTER — Other Ambulatory Visit (HOSPITAL_COMMUNITY): Payer: Self-pay

## 2021-12-13 ENCOUNTER — Other Ambulatory Visit: Payer: Self-pay

## 2021-12-18 ENCOUNTER — Other Ambulatory Visit (HOSPITAL_COMMUNITY): Payer: Self-pay

## 2021-12-23 ENCOUNTER — Other Ambulatory Visit: Payer: Self-pay | Admitting: Internal Medicine

## 2021-12-24 ENCOUNTER — Other Ambulatory Visit: Payer: Self-pay

## 2021-12-24 ENCOUNTER — Ambulatory Visit (INDEPENDENT_AMBULATORY_CARE_PROVIDER_SITE_OTHER): Payer: Medicare Other

## 2021-12-24 DIAGNOSIS — I2699 Other pulmonary embolism without acute cor pulmonale: Secondary | ICD-10-CM | POA: Diagnosis not present

## 2021-12-24 DIAGNOSIS — Z7901 Long term (current) use of anticoagulants: Secondary | ICD-10-CM

## 2021-12-24 LAB — POCT INR: INR: 5.4 — AB (ref 2.0–3.0)

## 2021-12-24 NOTE — Patient Instructions (Signed)
HOLD Tuesday AND Wednesday and then Decrease to 1 tablet ('5mg'$ ) daily, except Monday, Wednesday and Friday 2 tablets. Recheck INR 2 weeks. Coumadin Clinic [phone] ?

## 2021-12-25 ENCOUNTER — Ambulatory Visit (INDEPENDENT_AMBULATORY_CARE_PROVIDER_SITE_OTHER): Payer: Medicare Other

## 2021-12-25 ENCOUNTER — Other Ambulatory Visit (HOSPITAL_COMMUNITY): Payer: Self-pay

## 2021-12-25 DIAGNOSIS — I428 Other cardiomyopathies: Secondary | ICD-10-CM

## 2021-12-25 LAB — CUP PACEART REMOTE DEVICE CHECK
Battery Remaining Longevity: 68 mo
Battery Remaining Percentage: 66 %
Battery Voltage: 2.96 V
Brady Statistic RV Percent Paced: 1 %
Date Time Interrogation Session: 20230328020015
HighPow Impedance: 73 Ohm
HighPow Impedance: 73 Ohm
Implantable Lead Implant Date: 20190918
Implantable Lead Location: 753860
Implantable Lead Model: 7122
Implantable Pulse Generator Implant Date: 20190918
Lead Channel Impedance Value: 440 Ohm
Lead Channel Pacing Threshold Amplitude: 0.5 V
Lead Channel Pacing Threshold Pulse Width: 0.5 ms
Lead Channel Sensing Intrinsic Amplitude: 12 mV
Lead Channel Setting Pacing Amplitude: 2.5 V
Lead Channel Setting Pacing Pulse Width: 0.5 ms
Lead Channel Setting Sensing Sensitivity: 0.5 mV
Pulse Gen Serial Number: 9796641

## 2021-12-27 ENCOUNTER — Other Ambulatory Visit (HOSPITAL_COMMUNITY): Payer: Self-pay

## 2021-12-31 ENCOUNTER — Encounter: Payer: Self-pay | Admitting: Gastroenterology

## 2022-01-03 ENCOUNTER — Other Ambulatory Visit (HOSPITAL_COMMUNITY): Payer: Self-pay

## 2022-01-04 ENCOUNTER — Ambulatory Visit (HOSPITAL_COMMUNITY)
Admission: RE | Admit: 2022-01-04 | Discharge: 2022-01-04 | Disposition: A | Discharge status: Home / Self Care | Payer: Medicare Other | Source: Ambulatory Visit | Attending: Family Medicine | Admitting: Family Medicine

## 2022-01-04 DIAGNOSIS — M25559 Pain in unspecified hip: Secondary | ICD-10-CM | POA: Diagnosis present

## 2022-01-04 NOTE — Progress Notes (Signed)
Informed of MRI for today.  ? ?Device system confirmed to be MRI conditional, with implant date > 6 weeks ago, and no evidence of abandoned or epicardial leads in review of most recent CXR ?Interrogation from today reviewed, pt with minimal pacing.  ? ?No need to change pacing settings per industry. ? ?Tachy-therapies to off. ? ?Program device back to pre-MRI settings after completion of exam. ? ?Shirley Friar, PA-C  ?01/04/2022 12:16 PM   ?

## 2022-01-04 NOTE — Progress Notes (Signed)
Per order, No need to change pacing settings per industry.  ? ?Tachy-therapies to off.  ? ?Will program device back to pre-MRI settings after completion of exam ?

## 2022-01-07 ENCOUNTER — Encounter: Payer: Self-pay | Admitting: Nurse Practitioner

## 2022-01-07 ENCOUNTER — Ambulatory Visit (INDEPENDENT_AMBULATORY_CARE_PROVIDER_SITE_OTHER): Payer: Medicare Other | Admitting: Nurse Practitioner

## 2022-01-07 ENCOUNTER — Ambulatory Visit (INDEPENDENT_AMBULATORY_CARE_PROVIDER_SITE_OTHER): Payer: Medicare Other

## 2022-01-07 VITALS — BP 108/80 | HR 78 | Temp 98.0°F | Ht 66.0 in | Wt 167.2 lb

## 2022-01-07 DIAGNOSIS — J479 Bronchiectasis, uncomplicated: Secondary | ICD-10-CM | POA: Diagnosis not present

## 2022-01-07 DIAGNOSIS — R0609 Other forms of dyspnea: Secondary | ICD-10-CM

## 2022-01-07 DIAGNOSIS — Z7901 Long term (current) use of anticoagulants: Secondary | ICD-10-CM | POA: Diagnosis not present

## 2022-01-07 DIAGNOSIS — R058 Other specified cough: Secondary | ICD-10-CM | POA: Diagnosis not present

## 2022-01-07 DIAGNOSIS — J310 Chronic rhinitis: Secondary | ICD-10-CM

## 2022-01-07 DIAGNOSIS — I2699 Other pulmonary embolism without acute cor pulmonale: Secondary | ICD-10-CM | POA: Diagnosis not present

## 2022-01-07 LAB — POCT INR: INR: 2.7 (ref 2.0–3.0)

## 2022-01-07 MED ORDER — FLUTICASONE PROPIONATE 50 MCG/ACT NA SUSP: 1.0000 | Freq: Every day | NASAL | 3 refills | Status: DC

## 2022-01-07 MED ORDER — ALBUTEROL SULFATE HFA 108 (90 BASE) MCG/ACT IN AERS: 2.0000 | INHALATION_SPRAY | Freq: Four times a day (QID) | RESPIRATORY_TRACT | 2 refills | Status: DC | PRN

## 2022-01-07 MED ORDER — LORATADINE 10 MG PO TABS: 10.0000 mg | ORAL_TABLET | Freq: Every day | ORAL | 5 refills | Status: DC

## 2022-01-07 NOTE — Progress Notes (Signed)
? ?'@Patient'$  ID: Jonathan Dickson, male    DOB: 01/27/1956, 66 y.o.   MRN: [id number] ? ?Chief Complaint  ?Patient presents with  ? Follow-up  ? ? ?Referring provider: ?Ladell Pier, MD ? ?HPI: ?66 year old male, former smoker (27 pack years) followed for DOE. He is a patient of Dr. Mauricio Po and last seen in office for consul on 03/07/2021. 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  ? ?TEST/EVENTS:  ?01/19/2021 PFTs: FVC 66, FEV1 73, ratio 84, TLC 64, DLCOunc 48%. 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 Moderate sized hiatal hernia. Some linear scarring in the LLL and inferior segment of lingular. Isolated, mild cylindrical BTX LLL. Likely post infectious scarring. Small calcified granuloma in the RUL. No evidence of ILD.  ? ?03/07/2021: OV with Dr. Shearon Stalls for consultation of DOE and concern for possible ILD. Pt had COVID in 2020 and has had profound DOE since; some associated wheezing and coughing (non-productive). Previous trial with Breo without any relief in symptoms. Felt albuterol helped some. Restriction and reduced DLCO on recent PFTs. HRCT chest ordered for further evaluation.  ? ?01/07/2022: Today - follow up ?Patient presents today for follow up after undergoing workup for DOE. He reports that his breathing is relatively stable. He does still have some shortness of breath with exertion and occasional dry cough. He denies any orthopnea, PND, wheezing or lower extremity swelling. He does have some nasal drainage at times, which is clear. He doesn't feel like he has any significant allergy type symptoms. He has not required use of his albuterol inhaler and no longer has one available to him. He is followed by cardiology for HF and cardiomyopathy. He is on life long anticoagulation d/t DVT and PE in 2019 with coumadin.  ? ?Allergies  ?Allergen  Reactions  ? Peanut-Containing Drug Products Swelling  ? Penicillins Other (See Comments)  ?  CONVULSIONS ?Did it involve swelling of the face/tongue/throat, SOB, or low BP? No ?Did it involve sudden or severe rash/hives, skin peeling, or any reaction on the inside of your mouth or nose? No ?Did you need to seek medical attention at a hospital or [facility]on's office? Yes ?When did it last happen?      25 years ?If all above answers are ?NO?, may proceed with cephalosporin use. ?  ? Decadron [Dexamethasone] Itching  ? ? ?Immunization History  ?Administered Date(s) Administered  ? Influenza,inj,Quad PF,6+ Mos 05/21/2016, 06/21/2019, 10/30/2020, 06/27/2021  ? Moderna Sars-Covid-2 Vaccination 11/25/2019, 12/23/2019, 07/12/2020  ? Pneumococcal Conjugate-13 01/16/2021  ? Pneumococcal Polysaccharide-23 05/21/2016  ? Tdap 11/26/2016, 02/15/2019  ? Zoster Recombinat (Shingrix) 03/06/2021  ? ? ?Past Medical History:  ?Diagnosis Date  ? Abnormal TSH 02/2016  ? AICD (automatic cardioverter/defibrillator) present   ? Anxiety   ? Cervical disc disease   ? Chronic combined systolic and diastolic CHF (congestive heart failure) ([place])   ? a. 01/2016 Echo: EF 25%, inf AK, diffuse sev HK, Gr1 DD, mild MR.  ? Depression   ? Diabetes mellitus without complication ([place])   ? Dyspnea   ? Dysrhythmia   ? History of hiatal hernia   ? Hyperglycemia   ? Hypertension   ? Hypertensive heart disease   ? Myocardial infarction [facility])   ? ??  maybe  ? NICM (nonischemic cardiomyopathy) ([place])   ? a. 01/2016 Echo: EF 25%, inf AK, diffuse  sev HK, Gr1 DD, mild MR; b. 01/2016 MV: EF 22%, no isch/infarct;  c. 02/2016 Cath: Nl cors, EF 35-45%.  ? Pneumonia   ? Pre-diabetes   ? Seizures ([place])   ? with penicillin  ? ? ?Tobacco History: ?Social History  ? ?Tobacco Use  ?Smoking Status Former  ? Packs/day: 1.50  ? Years: 18.00  ? Pack years: 27.00  ? Types: Cigarettes  ? Quit date: 03/15/2003  ? Years since quitting: 18.8  ?Smokeless Tobacco Never  ? ?Counseling given:  Not Answered ? ? ?Outpatient Medications Prior to Visit  ?Medication Sig Dispense Refill  ? acetaminophen (TYLENOL) 500 MG tablet Take 1,000 mg by mouth every 6 (six) hours as needed for moderate pain or headache.    ? amitriptyline (ELAVIL) 25 MG tablet TAKE 1 TABLET(25 MG) BY MOUTH AT BEDTIME AS NEEDED FOR SLEEP (Patient taking differently: Take 25 mg by mouth at bedtime as needed for sleep.) 60 tablet 0  ? Aromatic Inhalants (VICKS VAPOINHALER) INHA Inhale 1 Dose into the lungs daily as needed (congestion).    ? atorvastatin (LIPITOR) 80 MG tablet Take 1 tablet (80 mg total) by mouth daily. 90 tablet 3  ? carvedilol (COREG) 12.5 MG tablet Take 1 tablet (12.5 mg total) by mouth 2 (two) times daily with a meal. 180 tablet 0  ? Chlorphen-Phenyleph-ASA (ALKA-SELTZER PLUS COLD PO) Take 1-2 tablets by mouth 2 (two) times daily as needed (cold symptoms).     ? Cholecalciferol (VITAMIN D) 50 MCG (2000 UT) tablet Take 2,000 Units by mouth daily.    ? cyclobenzaprine (FLEXERIL) 5 MG tablet Take 1 tablet (5 mg total) by mouth 3 (three) times daily as needed for muscle spasms. 30 tablet 1  ? diclofenac Sodium (VOLTAREN) 1 % GEL Apply 2 g topically 4 (four) times daily as needed (pain).    ? FARXIGA 10 MG TABS tablet TAKE 1 TABLET(10 MG) BY MOUTH DAILY (Patient taking differently: Take 10 mg by mouth daily.) 90 tablet 3  ? furosemide (LASIX) 40 MG tablet Take 1 tablet (40 mg total) by mouth daily as needed. For fluid (if weight gain more than 3 lbs in 1 day) (Patient taking differently: Take 40 mg by mouth daily as needed for fluid (weight gain of more than 3lbs in 1 day).) 30 tablet 6  ? metFORMIN (GLUCOPHAGE) 500 MG tablet Take 1 tablet (500 mg total) by mouth 2 (two) times daily with a meal. 60 tablet 2  ? methocarbamol (ROBAXIN) 500 MG tablet Take 1 tablet (500 mg total) by mouth every 6 (six) hours as needed for muscle spasms. 60 tablet 0  ? sacubitril-valsartan (ENTRESTO) 49-51 MG Take 1 tablet by mouth 2 (two) times  daily. 60 tablet 11  ? spironolactone (ALDACTONE) 25 MG tablet TAKE 1/2 TABLET(12.5 MG) BY MOUTH DAILY (Patient taking differently: Take 12.5 mg by mouth daily.) 15 tablet 11  ? Tafamidis 61 MG CAPS Take 1 capsule by mouth daily. (Patient taking differently: Take 61 mg by mouth daily.) 30 capsule 11  ? warfarin (COUMADIN) 5 MG tablet TAKE 2 TABLETS BY MOUTH DAILY AS DIRECTED BY COUMADIN CLINIC 180 tablet 0  ? HYDROcodone-acetaminophen (NORCO/VICODIN) 5-325 MG tablet Take 1 tablet by mouth every 6 (six) hours as needed for moderate pain or severe pain. (Patient not taking: Reported on 01/07/2022) 20 tablet 0  ? oxyCODONE-acetaminophen (PERCOCET/ROXICET) 5-325 MG tablet Take 1 tablet by mouth every 6 (six) hours as needed for severe pain. (Patient not taking: Reported on 01/07/2022)  6 tablet 0  ? ?No facility-administered medications prior to visit.  ? ? ? ?Review of Systems:  ? ?Constitutional: No weight loss or gain, night sweats, fevers, chills, fatigue, or lassitude. ?HEENT: No headaches, difficulty swallowing, tooth/dental problems, or sore throat. No sneezing, itching, ear ache. +occasional nasal congestion/drainage ?CV:  No chest pain, orthopnea, PND, swelling in lower extremities, anasarca, dizziness, palpitations, syncope ?Resp: +shortness of breath with exertion (stable); occasional dry cough. No excess mucus or change in color of mucus. No hemoptysis. No wheezing.  No chest wall deformity ?GI:  No heartburn, indigestion, abdominal pain, nausea, vomiting, diarrhea, change in bowel habits, loss of appetite, bloody stools.  ?Skin: No rash, lesions, ulcerations ?MSK:  No joint pain or swelling.  No decreased range of motion.  No back pain. ?Neuro: No dizziness or lightheadedness.  ?Psych: No depression or anxiety. Mood stable.  ? ? ? ?Physical Exam: ? ?BP 108/80 (BP Location: Right Arm, Patient Position: Sitting, Cuff Size: Normal)   Pulse 78   Temp 98 ?F (36.7 ?C) (Oral)   Ht '5\' 6"'$  (1.676 m)   Wt 167 lb 3.2  oz (75.8 kg)   SpO2 99%   BMI 26.99 kg/m?  ? ?GEN: Pleasant, interactive, well-appearing; in no acute distress. ?HEENT:  Normocephalic and atraumatic. PERRLA. Sclera white. Nasal turbinates boggy, moist and

## 2022-01-07 NOTE — Assessment & Plan Note (Signed)
See above plan. 

## 2022-01-07 NOTE — Assessment & Plan Note (Addendum)
Mild LLL with scarring, like post infectious. Initiate therapies for mucociliary clearance.  ?

## 2022-01-07 NOTE — Progress Notes (Signed)
Remote ICD transmission.   

## 2022-01-07 NOTE — Patient Instructions (Signed)
Continue 1 tablet ('5mg'$ ) daily, except Monday, Wednesday and Friday 2 tablets. Recheck INR 6 weeks. [facility] [phone] ?

## 2022-01-07 NOTE — Assessment & Plan Note (Addendum)
Breathing stable overall. PFTs with restriction and diffusion defect. No evidence of ILD on HRCT chest. He did have some mild BTX in LLL and scarring, likely post COVID. Could be contributing factor to cough. Suspect his DOE primarily r/t to HFrEF. Exercise encouraged, as tolerated. Consider cardiopulm rehab. ? ?Patient Instructions  ?-Albuterol inhaler 2 puffs every 6 hours as needed for shortness of breath or wheezing. Notify if symptoms persist despite rescue inhaler/neb use. ?-Mucinex 600 mg Twice daily for congestion/cough ?-Flutter valve 2-3 times per day  ?-Flonase nasal spray 2 sprays each nostril daily ?-Saline nasal spray 2-3 times a day  ?-Claritin (loratidine) 10 mg daily for allergies/postnasal drainage  ? ?Frequent sips of water. Avoid throat clearing. Sugar free hard candies throughout the day  ? ?Follow up in 3 months with Dr. Shearon Stalls. If symptoms do not improve or worsen, please contact office for sooner follow up or seek emergency care. ? ? ?

## 2022-01-07 NOTE — Patient Instructions (Addendum)
-  Albuterol inhaler 2 puffs every 6 hours as needed for shortness of breath or wheezing. Notify if symptoms persist despite rescue inhaler/neb use. ?-Mucinex 600 mg Twice daily for congestion/cough ?-Flutter valve 2-3 times per day  ?-Flonase nasal spray 2 sprays each nostril daily ?-Saline nasal spray 2-3 times a day  ?-Claritin (loratidine) 10 mg daily for allergies/postnasal drainage  ? ?Frequent sips of water. Avoid throat clearing. Sugar free hard candies throughout the day  ? ?Follow up in 3 months with Jonathan Dickson. If symptoms do not improve or worsen, please contact office for sooner follow up or seek emergency care. ?

## 2022-01-07 NOTE — Assessment & Plan Note (Addendum)
Evidence of postnasal drainage and rhinitis on exam. Trigger prevention with antihistamine. Target postnasal drainage control.  ?

## 2022-01-15 ENCOUNTER — Ambulatory Visit (INDEPENDENT_AMBULATORY_CARE_PROVIDER_SITE_OTHER): Payer: Medicare Other | Admitting: Podiatry

## 2022-01-15 ENCOUNTER — Encounter: Payer: Self-pay | Admitting: Podiatry

## 2022-01-15 DIAGNOSIS — D689 Coagulation defect, unspecified: Secondary | ICD-10-CM

## 2022-01-15 DIAGNOSIS — I739 Peripheral vascular disease, unspecified: Secondary | ICD-10-CM

## 2022-01-15 DIAGNOSIS — E119 Type 2 diabetes mellitus without complications: Secondary | ICD-10-CM | POA: Diagnosis not present

## 2022-01-15 DIAGNOSIS — M79674 Pain in right toe(s): Secondary | ICD-10-CM | POA: Diagnosis not present

## 2022-01-15 DIAGNOSIS — B351 Tinea unguium: Secondary | ICD-10-CM

## 2022-01-15 DIAGNOSIS — M79675 Pain in left toe(s): Secondary | ICD-10-CM

## 2022-01-15 NOTE — Progress Notes (Signed)
This patient returns to my office for at risk foot care.  This patient requires this care by a professional since this patient will be at risk due to having coagulation defect, diabetes and peripheral claudication.  Patient is taking coumadin.  This patient is unable to cut nails himself since the patient cannot reach his nails.These nails are painful walking and wearing shoes.  This patient presents for at risk foot care today.  General Appearance  Alert, conversant and in no acute stress.  Vascular  Dorsalis pedis and posterior tibial  pulses are weakly palpable  bilaterally.  Capillary return is within normal limits  bilaterally. Cold feet bilaterally. Absent digital hair.  Neurologic  Senn-Weinstein monofilament wire test within normal limits  bilaterally. Muscle power within normal limits bilaterally.  Nails Thick disfigured discolored nails with subungual debris  from hallux to fifth toes bilaterally. No evidence of bacterial infection or drainage bilaterally.  Orthopedic  No limitations of motion  feet .  No crepitus or effusions noted.  No bony pathology or digital deformities noted.  Skin  normotropic skin with no porokeratosis noted bilaterally.  No signs of infections or ulcers noted.     Onychomycosis  Pain in right toes  Pain in left toes  Consent was obtained for treatment procedures.   Mechanical debridement of nails 1-5  bilaterally performed with a nail nipper.  Filed with dremel without incident.    Return office visit   3  months                   Told patient to return for periodic foot care and evaluation due to potential at risk complications.   Perrie Ragin DPM  

## 2022-01-18 ENCOUNTER — Telehealth: Payer: Self-pay | Admitting: Internal Medicine

## 2022-01-18 NOTE — Telephone Encounter (Signed)
Received request for appointment from PCP.  ?

## 2022-01-18 NOTE — Progress Notes (Incomplete)
?[facility] Note ? ?01/21/2022 ? ?  ? ?CHIEF COMPLAINT ?Patient presents for No chief complaint on file. ? ? ?HISTORY OF PRESENT ILLNESS: ?Jonathan Dickson is a 66 y.o. male who presents to the clinic today for:  ? ? ? ?Referring physician: ?Antony Blackbird MD ?[address] ?[place], [place] [address] ? ? ?HISTORICAL INFORMATION:  ? ?Selected notes from the [facility] ?Referred by Dr. Antony Blackbird for diabetic retinal evaluation ?LEE:  ?Ocular Hx- ?PMH- DM, HTN, high cholesterol; last a1c was 6.8 on 02.09.23 ?  ? ?CURRENT MEDICATIONS: ?No current outpatient medications on file. (Ophthalmic Drugs)  ? ?No current facility-administered medications for this visit. (Ophthalmic Drugs)  ? ?Current Outpatient Medications (Other)  ?Medication Sig  ? acetaminophen (TYLENOL) 500 MG tablet Take 1,000 mg by mouth every 6 (six) hours as needed for moderate pain or headache.  ? albuterol (VENTOLIN HFA) 108 (90 Base) MCG/ACT inhaler Inhale 2 puffs into the lungs every 6 (six) hours as needed for wheezing or shortness of breath.  ? amitriptyline (ELAVIL) 25 MG tablet TAKE 1 TABLET(25 MG) BY MOUTH AT BEDTIME AS NEEDED FOR SLEEP (Patient taking differently: Take 25 mg by mouth at bedtime as needed for sleep.)  ? Aromatic Inhalants (VICKS VAPOINHALER) INHA Inhale 1 Dose into the lungs daily as needed (congestion).  ? atorvastatin (LIPITOR) 80 MG tablet Take 1 tablet (80 mg total) by mouth daily.  ? carvedilol (COREG) 12.5 MG tablet Take 1 tablet (12.5 mg total) by mouth 2 (two) times daily with a meal.  ? Chlorphen-Phenyleph-ASA (ALKA-SELTZER PLUS COLD PO) Take 1-2 tablets by mouth 2 (two) times daily as needed (cold symptoms).   ? Cholecalciferol (VITAMIN D) 50 MCG (2000 UT) tablet Take 2,000 Units by mouth daily.  ? cyclobenzaprine (FLEXERIL) 5 MG tablet Take 1 tablet (5 mg total) by mouth 3 (three) times daily as needed for muscle spasms.  ? diclofenac Sodium (VOLTAREN) 1 % GEL Apply 2 g  topically 4 (four) times daily as needed (pain).  ? FARXIGA 10 MG TABS tablet TAKE 1 TABLET(10 MG) BY MOUTH DAILY (Patient taking differently: Take 10 mg by mouth daily.)  ? fluticasone (FLONASE) 50 MCG/ACT nasal spray Place 1 spray into both nostrils daily.  ? furosemide (LASIX) 40 MG tablet Take 1 tablet (40 mg total) by mouth daily as needed. For fluid (if weight gain more than 3 lbs in 1 day) (Patient taking differently: Take 40 mg by mouth daily as needed for fluid (weight gain of more than 3lbs in 1 day).)  ? HYDROcodone-acetaminophen (NORCO/VICODIN) 5-325 MG tablet Take 1 tablet by mouth every 6 (six) hours as needed for moderate pain or severe pain. (Patient not taking: Reported on 01/07/2022)  ? loratadine (CLARITIN) 10 MG tablet Take 1 tablet (10 mg total) by mouth daily.  ? metFORMIN (GLUCOPHAGE) 500 MG tablet Take 1 tablet (500 mg total) by mouth 2 (two) times daily with a meal.  ? methocarbamol (ROBAXIN) 500 MG tablet Take 1 tablet (500 mg total) by mouth every 6 (six) hours as needed for muscle spasms.  ? oxyCODONE-acetaminophen (PERCOCET/ROXICET) 5-325 MG tablet Take 1 tablet by mouth every 6 (six) hours as needed for severe pain. (Patient not taking: Reported on 01/07/2022)  ? sacubitril-valsartan (ENTRESTO) 49-51 MG Take 1 tablet by mouth 2 (two) times daily.  ? spironolactone (ALDACTONE) 25 MG tablet TAKE 1/2 TABLET(12.5 MG) BY MOUTH DAILY (Patient taking differently: Take 12.5 mg by mouth daily.)  ?  Tafamidis 61 MG CAPS Take 1 capsule by mouth daily. (Patient taking differently: Take 61 mg by mouth daily.)  ? warfarin (COUMADIN) 5 MG tablet TAKE 2 TABLETS BY MOUTH DAILY AS DIRECTED BY COUMADIN CLINIC  ? ?No current facility-administered medications for this visit. (Other)  ? ? ? ? ?REVIEW OF SYSTEMS: ? ? ? ?ALLERGIES ?Allergies  ?Allergen Reactions  ? Peanut-Containing Drug Products Swelling  ? Penicillins Other (See Comments)  ?  CONVULSIONS ?Did it involve swelling of the face/tongue/throat, SOB,  or low BP? No ?Did it involve sudden or severe rash/hives, skin peeling, or any reaction on the inside of your mouth or nose? No ?Did you need to seek medical attention at a hospital or doctor's office? Yes ?When did it last happen?      25 years ?If all above answers are ?NO?, may proceed with cephalosporin use. ?  ? Decadron [Dexamethasone] Itching  ? ? ?PAST MEDICAL HISTORY ?Past Medical History:  ?Diagnosis Date  ? Abnormal TSH 02/2016  ? AICD (automatic cardioverter/defibrillator) present   ? Anxiety   ? Cervical disc disease   ? Chronic combined systolic and diastolic CHF (congestive heart failure) ([place])   ? a. 01/2016 Echo: EF 25%, inf AK, diffuse sev HK, Gr1 DD, mild MR.  ? Depression   ? Diabetes mellitus without complication ([place])   ? Dyspnea   ? Dysrhythmia   ? History of hiatal hernia   ? Hyperglycemia   ? Hypertension   ? Hypertensive heart disease   ? Myocardial infarction [facility])   ? ??  maybe  ? NICM (nonischemic cardiomyopathy) ([place])   ? a. 01/2016 Echo: EF 25%, inf AK, diffuse sev HK, Gr1 DD, mild MR; b. 01/2016 MV: EF 22%, no isch/infarct;  c. 02/2016 Cath: Nl cors, EF 35-45%.  ? Pneumonia   ? Pre-diabetes   ? Seizures ([place])   ? with penicillin  ? ?Past Surgical History:  ?Procedure Laterality Date  ? ANTERIOR CERVICAL DECOMP/DISCECTOMY FUSION N/A 07/10/2016  ? Procedure: ANTERIOR CERVICAL DECOMPRESSION FUSION CERVICAL 4-5, CERVICAL 5-6, CERVICAL 6-7 WITH INSTRUMENTATION AND ALLOGRAFT;  Surgeon: [name], MD;  Location: [facility];  Service: Orthopedics;  Laterality: N/A;  ? ANTERIOR CERVICAL DECOMP/DISCECTOMY FUSION N/A 04/19/2021  ? Procedure: ANTERIOR CERVICAL DECOMPRESSION FUSION CERVICAL 3- CERVICAL 4 WITH INSTRUMENTATION AND ALLOGRAFT;  Surgeon: [name], MD;  Location: [facility];  Service: Orthopedics;  Laterality: N/A;  ? CARDIAC CATHETERIZATION N/A 03/11/2016  ? Procedure: Left Heart Cath and Coronary Angiography;  Surgeon: [name], MD;  Location: [facility];  Service:  Cardiovascular;  Laterality: N/A;  ? ICD IMPLANT N/A 06/17/2018  ? Procedure: ICD IMPLANT;  Surgeon: [name], MD;  Location: [facility];  Service: Cardiovascular;  Laterality: N/A;  ? Thumb surgery Right   ? VENTRAL HERNIA REPAIR N/A 10/25/2019  ? Procedure: PRIMARY VENTRAL HERNIA REPAIR;  Surgeon: [name], MD;  Location: [facility];  Service: General;  Laterality: N/A;  ? ? ?FAMILY HISTORY ?Family History  ?Problem Relation Age of Onset  ? Diabetes Mother   ? Diabetes Sister   ? Clotting disorder Neg Hx   ? Lung disease Neg Hx   ? ? ?SOCIAL HISTORY ?Social History  ? ?Tobacco Use  ? Smoking status: Former  ?  Packs/day: 1.50  ?  Years: 18.00  ?  Pack years: 27.00  ?  Types: Cigarettes  ?  Quit date: 03/15/2003  ?  Years since quitting: 18.8  ?  Smokeless tobacco: Never  ?Vaping Use  ? Vaping Use: Never used  ?Substance Use Topics  ? Alcohol use: No  ?  Comment: none since 2004  ? Drug use: No  ?  Comment: former  none since 2004  ? ?  ? ?  ? ?OPHTHALMIC EXAM: ? ?Not recorded ?  ? ? ?IMAGING AND PROCEDURES  ?Imaging and Procedures for 01/21/2022 ? ? ? ?  ?  ? ?  ?ASSESSMENT/PLAN: ? ?No diagnosis found. ? ?1. ? ?2. ? ?3. ? ?Ophthalmic Meds Ordered this visit:  ?No orders of the defined types were placed in this encounter. ? ? ?  ? ?No follow-ups on file. ? ?There are no Patient Instructions on file for this visit. ? ? ?Explained the diagnoses, plan, and follow up with the patient and they expressed understanding.  Patient expressed understanding of the importance of proper follow up care.  ? ?This document serves as a record of services personally performed by Gardiner Sleeper, MD, PhD. It was created on their behalf by Roselee Nova, COMT. The creation of this record is the provider's dictation and/or activities during the visit. ? ?Electronically signed by: Roselee Nova, COMT 01/18/22 8:35 AM ? ? ? ?Gardiner Sleeper, M.D., Ph.D. ?Diseases & Surgery of the Retina and Vitreous ?[place] ?'@TODAY'$ @ ? ? ? ? ?Abbreviations: ?M myopia (nearsighted); A astigmatism; H hyperopia (farsighted); P presbyopia; Mrx spectacle prescription;  CTL contact lenses; OD right eye; OS left eye; OU both

## 2022-01-21 ENCOUNTER — Ambulatory Visit (INDEPENDENT_AMBULATORY_CARE_PROVIDER_SITE_OTHER): Payer: Medicare Other | Admitting: Ophthalmology

## 2022-01-21 ENCOUNTER — Encounter (INDEPENDENT_AMBULATORY_CARE_PROVIDER_SITE_OTHER): Payer: Self-pay | Admitting: Ophthalmology

## 2022-01-21 DIAGNOSIS — H25813 Combined forms of age-related cataract, bilateral: Secondary | ICD-10-CM | POA: Diagnosis not present

## 2022-01-21 DIAGNOSIS — I10 Essential (primary) hypertension: Secondary | ICD-10-CM

## 2022-01-21 DIAGNOSIS — H35033 Hypertensive retinopathy, bilateral: Secondary | ICD-10-CM | POA: Diagnosis not present

## 2022-01-21 DIAGNOSIS — E119 Type 2 diabetes mellitus without complications: Secondary | ICD-10-CM

## 2022-01-21 NOTE — Progress Notes (Signed)
?[facility] Note ? ?01/21/2022 ? ?  ? ?CHIEF COMPLAINT ?Patient presents for Diabetic Eye Exam ? ? ?HISTORY OF PRESENT ILLNESS: ?Jonathan Dickson is a 66 y.o. male who presents to the clinic today for:  ? ?HPI   ? ? Diabetic Eye Exam   ?Vision is stable.  Diabetes characteristics include Type 2.  This started 3 years ago.  Blood sugar level is controlled.  Last A1C 8.  I, the attending physician,  performed the HPI with the patient and updated documentation appropriately. ? ?  ?  ? ? Comments   ?Retina eval per Dr. Chapman Fitch for diabetic eye exam.  ?Patients last eye exam was last year with Dr. Katy Fitch.  ? ?  ?  ?Last edited by Bernarda Caffey, MD on 01/22/2022  8:34 AM.  ?  ?Pt is here on the referral of Dr. Chapman Fitch for a DM exam, pt states he also sees Dr. Katy Fitch for his routine eye exams, pt states he was not aware he had this appt until yesterday, his last  A1c was 6.6 on 03.24.23 and pt states his diabetes is well controlled ? ?Referring physician: ?Antony Blackbird, MD ?[address], [address] ?[place],  [place] [address] ? ?HISTORICAL INFORMATION:  ? ?Selected notes from the [facility] ?Referred by Dr. Chapman Fitch for diabetic exam ?LEE:  ?Ocular Hx- ?PMH- ?  ? ?CURRENT MEDICATIONS: ?No current outpatient medications on file. (Ophthalmic Drugs)  ? ?No current facility-administered medications for this visit. (Ophthalmic Drugs)  ? ?Current Outpatient Medications (Other)  ?Medication Sig  ? acetaminophen (TYLENOL) 500 MG tablet Take 1,000 mg by mouth every 6 (six) hours as needed for moderate pain or headache.  ? albuterol (VENTOLIN HFA) 108 (90 Base) MCG/ACT inhaler Inhale 2 puffs into the lungs every 6 (six) hours as needed for wheezing or shortness of breath.  ? amitriptyline (ELAVIL) 25 MG tablet TAKE 1 TABLET(25 MG) BY MOUTH AT BEDTIME AS NEEDED FOR SLEEP (Patient taking differently: Take 25 mg by mouth at bedtime as needed for sleep.)  ? Aromatic Inhalants (VICKS VAPOINHALER) INHA Inhale 1  Dose into the lungs daily as needed (congestion).  ? atorvastatin (LIPITOR) 80 MG tablet Take 1 tablet (80 mg total) by mouth daily.  ? carvedilol (COREG) 12.5 MG tablet Take 1 tablet (12.5 mg total) by mouth 2 (two) times daily with a meal.  ? Chlorphen-Phenyleph-ASA (ALKA-SELTZER PLUS COLD PO) Take 1-2 tablets by mouth 2 (two) times daily as needed (cold symptoms).   ? Cholecalciferol (VITAMIN D) 50 MCG (2000 UT) tablet Take 2,000 Units by mouth daily.  ? cyclobenzaprine (FLEXERIL) 5 MG tablet Take 1 tablet (5 mg total) by mouth 3 (three) times daily as needed for muscle spasms.  ? diclofenac Sodium (VOLTAREN) 1 % GEL Apply 2 g topically 4 (four) times daily as needed (pain).  ? FARXIGA 10 MG TABS tablet TAKE 1 TABLET(10 MG) BY MOUTH DAILY (Patient taking differently: Take 10 mg by mouth daily.)  ? fluticasone (FLONASE) 50 MCG/ACT nasal spray Place 1 spray into both nostrils daily.  ? furosemide (LASIX) 40 MG tablet Take 1 tablet (40 mg total) by mouth daily as needed. For fluid (if weight gain more than 3 lbs in 1 day) (Patient taking differently: Take 40 mg by mouth daily as needed for fluid (weight gain of more than 3lbs in 1 day).)  ? loratadine (CLARITIN) 10 MG tablet Take 1 tablet (10 mg total) by mouth daily.  ?  metFORMIN (GLUCOPHAGE) 500 MG tablet Take 1 tablet (500 mg total) by mouth 2 (two) times daily with a meal.  ? methocarbamol (ROBAXIN) 500 MG tablet Take 1 tablet (500 mg total) by mouth every 6 (six) hours as needed for muscle spasms.  ? sacubitril-valsartan (ENTRESTO) 49-51 MG Take 1 tablet by mouth 2 (two) times daily.  ? spironolactone (ALDACTONE) 25 MG tablet TAKE 1/2 TABLET(12.5 MG) BY MOUTH DAILY (Patient taking differently: Take 12.5 mg by mouth daily.)  ? Tafamidis 61 MG CAPS Take 1 capsule by mouth daily. (Patient taking differently: Take 61 mg by mouth daily.)  ? warfarin (COUMADIN) 5 MG tablet TAKE 2 TABLETS BY MOUTH DAILY AS DIRECTED BY COUMADIN CLINIC  ? HYDROcodone-acetaminophen  (NORCO/VICODIN) 5-325 MG tablet Take 1 tablet by mouth every 6 (six) hours as needed for moderate pain or severe pain. (Patient not taking: Reported on 01/07/2022)  ? oxyCODONE-acetaminophen (PERCOCET/ROXICET) 5-325 MG tablet Take 1 tablet by mouth every 6 (six) hours as needed for severe pain. (Patient not taking: Reported on 01/07/2022)  ? ?No current facility-administered medications for this visit. (Other)  ? ?REVIEW OF SYSTEMS: ?ROS   ?Positive for: Endocrine, Cardiovascular, Eyes ?Negative for: Constitutional, Gastrointestinal, Neurological, Skin, Genitourinary, Musculoskeletal, HENT, Respiratory, Psychiatric, Allergic/Imm, Heme/Lymph ?Last edited by Leonie Douglas, [place] on 01/21/2022  1:13 PM.  ?  ? ?ALLERGIES ?Allergies  ?Allergen Reactions  ? Peanut-Containing Drug Products Swelling  ? Penicillins Other (See Comments)  ?  CONVULSIONS ?Did it involve swelling of the face/tongue/throat, SOB, or low BP? No ?Did it involve sudden or severe rash/hives, skin peeling, or any reaction on the inside of your mouth or nose? No ?Did you need to seek medical attention at a hospital or doctor's office? Yes ?When did it last happen?      25 years ?If all above answers are ?NO?, may proceed with cephalosporin use. ?  ? Decadron [Dexamethasone] Itching  ? ? ?PAST MEDICAL HISTORY ?Past Medical History:  ?Diagnosis Date  ? Abnormal TSH 02/2016  ? AICD (automatic cardioverter/defibrillator) present   ? Anxiety   ? Cervical disc disease   ? Chronic combined systolic and diastolic CHF (congestive heart failure) ([place])   ? a. 01/2016 Echo: EF 25%, inf AK, diffuse sev HK, Gr1 DD, mild MR.  ? Depression   ? Diabetes mellitus without complication ([place])   ? Dyspnea   ? Dysrhythmia   ? History of hiatal hernia   ? Hyperglycemia   ? Hypertension   ? Hypertensive heart disease   ? Myocardial infarction [facility])   ? ??  maybe  ? NICM (nonischemic cardiomyopathy) ([place])   ? a. 01/2016 Echo: EF 25%, inf AK, diffuse sev HK, Gr1 DD, mild MR; b. 01/2016  MV: EF 22%, no isch/infarct;  c. 02/2016 Cath: Nl cors, EF 35-45%.  ? Pneumonia   ? Pre-diabetes   ? Seizures ([place])   ? with penicillin  ? ?Past Surgical History:  ?Procedure Laterality Date  ? ANTERIOR CERVICAL DECOMP/DISCECTOMY FUSION N/A 07/10/2016  ? Procedure: ANTERIOR CERVICAL DECOMPRESSION FUSION CERVICAL 4-5, CERVICAL 5-6, CERVICAL 6-7 WITH INSTRUMENTATION AND ALLOGRAFT;  Surgeon: [name], MD;  Location: [place];  Service: Orthopedics;  Laterality: N/A;  ? ANTERIOR CERVICAL DECOMP/DISCECTOMY FUSION N/A 04/19/2021  ? Procedure: ANTERIOR CERVICAL DECOMPRESSION FUSION CERVICAL 3- CERVICAL 4 WITH INSTRUMENTATION AND ALLOGRAFT;  Surgeon: [name], MD;  Location: [facility];  Service: Orthopedics;  Laterality: N/A;  ? CARDIAC CATHETERIZATION N/A 03/11/2016  ? Procedure: Left Heart Cath and Coronary Angiography;  Surgeon: [name], MD;  Location: [facility];  Service: Cardiovascular;  Laterality: N/A;  ? ICD IMPLANT N/A 06/17/2018  ? Procedure: ICD IMPLANT;  Surgeon: [name], MD;  Location: [facility];  Service: Cardiovascular;  Laterality: N/A;  ? Thumb surgery Right   ? VENTRAL HERNIA REPAIR N/A 10/25/2019  ? Procedure: PRIMARY VENTRAL HERNIA REPAIR;  Surgeon: [name], MD;  Location: [facility];  Service: General;  Laterality: N/A;  ? ?FAMILY HISTORY ?Family History  ?Problem Relation Age of Onset  ? Diabetes Mother   ? Diabetes Sister   ? Clotting disorder Neg Hx   ? Lung disease Neg Hx   ? ?SOCIAL HISTORY ?Social History  ? ?Tobacco Use  ? Smoking status: Former  ?  Packs/day: 1.50  ?  Years: 18.00  ?  Pack years: 27.00  ?  Types: Cigarettes  ?  Quit date: 03/15/2003  ?  Years since quitting: 18.8  ? Smokeless tobacco: Never  ?Vaping Use  ? Vaping Use: Never used  ?Substance Use Topics  ? Alcohol use: No  ?  Comment: none since 2004  ? Drug use: No  ?  Comment: former  none since 2004  ?  ? ?  ?OPHTHALMIC EXAM: ? ?Base Eye Exam   ? ? Visual Acuity (Snellen - Linear)    ? ?   Right Left  ? Dist cc 20/20 20/25  ? Dist ph cc  NI  ? ? Correction: Glasses  ? ?  ?  ? ? Tonometry (Tonopen, 1:24 PM)   ? ?   Right Left  ? Pressure 14 13  ? ?  ?  ? ? Pupils   ? ?   Dark Light Shape Re

## 2022-01-22 ENCOUNTER — Encounter (INDEPENDENT_AMBULATORY_CARE_PROVIDER_SITE_OTHER): Payer: Self-pay | Admitting: Ophthalmology

## 2022-01-29 ENCOUNTER — Other Ambulatory Visit (HOSPITAL_COMMUNITY): Payer: Self-pay

## 2022-02-03 ENCOUNTER — Other Ambulatory Visit: Payer: Self-pay | Admitting: Physician Assistant

## 2022-02-03 DIAGNOSIS — E119 Type 2 diabetes mellitus without complications: Secondary | ICD-10-CM

## 2022-02-04 ENCOUNTER — Other Ambulatory Visit (HOSPITAL_COMMUNITY): Payer: Self-pay

## 2022-02-04 ENCOUNTER — Other Ambulatory Visit: Payer: Self-pay | Admitting: Family Medicine

## 2022-02-04 DIAGNOSIS — E119 Type 2 diabetes mellitus without complications: Secondary | ICD-10-CM

## 2022-02-05 NOTE — Telephone Encounter (Signed)
refilled 02/04/22  ? ?Requested Prescriptions  ?Refused Prescriptions Disp Refills  ?? metFORMIN (GLUCOPHAGE) 500 MG tablet [Pharmacy Med Name: METFORMIN 500MG TABLETS] 180 tablet   ?  Sig: TAKE 1 TABLET(500 MG) BY MOUTH TWICE DAILY WITH A MEAL  ?  ? Endocrinology:  Diabetes - Biguanides Failed - 02/04/2022 12:28 PM  ?  ?  Failed - Cr in normal range and within 360 days  ?  Creat  ?Date Value Ref Range Status  ?03/21/2016 0.98 0.70 - 1.25 mg/dL Final  ?  Comment:  ?    ?For patients > or = 67 years of age: The upper reference limit for ?Creatinine is approximately 13% higher for people identified as ?African-American. ?  ?  ? ?Creatinine, Ser  ?Date Value Ref Range Status  ?08/17/2021 1.25 (H) 0.61 - 1.24 mg/dL Final  ?   ?  ?  Failed - B12 Level in normal range and within 720 days  ?  No results found for: VITAMINB12   ?  ?  Passed - HBA1C is between 0 and 7.9 and within 180 days  ?  Hemoglobin A1C  ?Date Value Ref Range Status  ?11/08/2021 6.8 (A) 4.0 - 5.6 % Final  ? ?HbA1c, POC (controlled diabetic range)  ?Date Value Ref Range Status  ?10/30/2020 6.4 0.0 - 7.0 % Final  ? ?Hgb A1c MFr Bld  ?Date Value Ref Range Status  ?04/10/2021 7.4 (H) 4.8 - 5.6 % Final  ?  Comment:  ?  (NOTE) ?Pre diabetes:          5.7%-6.4% ? ?Diabetes:              >6.4% ? ?Glycemic control for   <7.0% ?adults with diabetes ?  ?   ?  ?  Passed - eGFR in normal range and within 360 days  ?  GFR, Est African American  ?Date Value Ref Range Status  ?10/04/2015 >89 >=60 mL/min Final  ? ?GFR calc Af Amer  ?Date Value Ref Range Status  ?07/21/2020 62 >59 mL/min/1.73 Final  ?  Comment:  ?  **In accordance with recommendations from the NKF-ASN Task force,** ?  [facility] is in the process of updating its eGFR calculation to the ?  2021 CKD-EPI creatinine equation that estimates kidney function ?  without a race variable. ?  ? ?GFR, Est Non African American  ?Date Value Ref Range Status  ?10/04/2015 >89 >=60 mL/min Final  ?  Comment:  ?    ?The estimated  GFR is a calculation valid for adults (>=75 years old) ?that uses the CKD-EPI algorithm to adjust for age and sex. It is   ?not to be used for children, pregnant women, hospitalized patients,    ?patients on dialysis, or with rapidly changing kidney function. ?According to the NKDEP, eGFR >89 is normal, 60-89 shows mild ?impairment, 30-59 shows moderate impairment, 15-29 shows severe ?impairment and <15 is ESRD. ?  ?  ? ?GFR, Estimated  ?Date Value Ref Range Status  ?08/17/2021 >60 >60 mL/min Final  ?  Comment:  ?  (NOTE) ?Calculated using the CKD-EPI Creatinine Equation (2021) ?  ? ?eGFR  ?Date Value Ref Range Status  ?04/04/2021 66 >59 mL/min/1.73 Final  ?   ?  ?  Passed - Valid encounter within last 6 months  ?  Recent Outpatient Visits   ?      ? 2 months ago Type 2 diabetes mellitus without complication, without long-term current use of insulin ([place])  ?  [place], [place], [place]  ? 7 months ago Type 2 diabetes mellitus without complication, without long-term current use of insulin ([place])  ? [place], [place], [place]  ? 9 months ago Patient left before evaluation by physician  ? [place] Ladell Pier, MD  ? 11 months ago Encounter for [employer] annual wellness exam  ? [place], [place]  ? 11 months ago Interstitial pulmonary fibrosis ([place])  ? [place] Elsie Stain, MD  ?  ?  ?Future Appointments   ?        ? In 2 months Spero Geralds, MD [facility]  ?  ? ?  ?  ?  Passed - CBC within normal limits and completed in the last 12 months  ?  WBC  ?Date Value Ref Range Status  ?08/17/2021 6.8 4.0 - 10.5 K/uL Final  ? ?RBC  ?Date Value Ref Range Status  ?08/17/2021 5.63 4.22 - 5.81 MIL/uL Final  ? ?Hemoglobin  ?Date Value Ref Range Status  ?08/17/2021 14.2 13.0 - 17.0 g/dL Final  ?10/30/2020 13.7 13.0 - 17.7  g/dL Final  ? ?HCT  ?Date Value Ref Range Status  ?08/17/2021 45.4 39.0 - 52.0 % Final  ? ?Hematocrit  ?Date Value Ref Range Status  ?10/30/2020 43.0 37.5 - 51.0 % Final  ? ?MCHC  ?Date Value Ref Range Status  ?08/17/2021 31.3 30.0 - 36.0 g/dL Final  ? ?MCH  ?Date Value Ref Range Status  ?08/17/2021 25.2 (L) 26.0 - 34.0 pg Final  ? ?MCV  ?Date Value Ref Range Status  ?08/17/2021 80.6 80.0 - 100.0 fL Final  ?10/30/2020 80 79 - 97 fL Final  ? ?No results found for: PLTCOUNTKUC, LABPLAT, [place] ?RDW  ?Date Value Ref Range Status  ?08/17/2021 16.9 (H) 11.5 - 15.5 % Final  ?10/30/2020 15.5 (H) 11.6 - 15.4 % Final  ? ?  ?  ?  ? ? ?

## 2022-02-12 ENCOUNTER — Ambulatory Visit: Payer: Medicare Other | Admitting: Gastroenterology

## 2022-02-15 ENCOUNTER — Encounter: Payer: Self-pay | Admitting: Gastroenterology

## 2022-02-18 ENCOUNTER — Ambulatory Visit (INDEPENDENT_AMBULATORY_CARE_PROVIDER_SITE_OTHER): Payer: Medicare Other

## 2022-02-18 DIAGNOSIS — Z7901 Long term (current) use of anticoagulants: Secondary | ICD-10-CM | POA: Diagnosis not present

## 2022-02-18 DIAGNOSIS — I2699 Other pulmonary embolism without acute cor pulmonale: Secondary | ICD-10-CM | POA: Diagnosis not present

## 2022-02-18 LAB — POCT INR: INR: 4.1 — AB (ref 2.0–3.0)

## 2022-02-18 NOTE — Patient Instructions (Signed)
HOLD Tuesday ONLY and then Continue 1 tablet ('5mg'$ ) daily, except Monday, Wednesday and Friday 2 tablets. Recheck INR 3 weeks. [facility]madin Clinic [phone]

## 2022-02-20 ENCOUNTER — Other Ambulatory Visit (HOSPITAL_COMMUNITY): Payer: Self-pay

## 2022-02-20 DIAGNOSIS — I5022 Chronic systolic (congestive) heart failure: Secondary | ICD-10-CM

## 2022-02-20 DIAGNOSIS — I10 Essential (primary) hypertension: Secondary | ICD-10-CM

## 2022-02-20 DIAGNOSIS — I428 Other cardiomyopathies: Secondary | ICD-10-CM

## 2022-02-20 MED ORDER — CARVEDILOL 12.5 MG PO TABS: 12.5000 mg | ORAL_TABLET | Freq: Two times a day (BID) | ORAL | 0 refills | Status: DC

## 2022-02-21 ENCOUNTER — Other Ambulatory Visit (HOSPITAL_COMMUNITY): Payer: Self-pay

## 2022-02-26 ENCOUNTER — Other Ambulatory Visit (HOSPITAL_COMMUNITY): Payer: Self-pay

## 2022-02-27 ENCOUNTER — Other Ambulatory Visit (HOSPITAL_COMMUNITY): Payer: Self-pay

## 2022-03-01 ENCOUNTER — Telehealth: Payer: Self-pay | Admitting: *Deleted

## 2022-03-01 NOTE — Telephone Encounter (Signed)
Caryl Pina,  Please call this patient verify if on Coumadin (Warfarin) and make office visit to see Dr.Armbruster or PA before colonoscopy can be done. He is on a blood thinner-Coumadin per chart.  Thank you, Amazin Pincock pv

## 2022-03-04 NOTE — Telephone Encounter (Signed)
Spoke with the patient. He confirms he is taking blood thinner-Coumadin. Made OV with PA, pre-visit cancelled. Pt aware.

## 2022-03-05 ENCOUNTER — Other Ambulatory Visit: Payer: Self-pay | Admitting: Family Medicine

## 2022-03-05 DIAGNOSIS — E119 Type 2 diabetes mellitus without complications: Secondary | ICD-10-CM

## 2022-03-05 NOTE — Telephone Encounter (Signed)
Pt. States he has a new PCP.

## 2022-03-11 ENCOUNTER — Ambulatory Visit (INDEPENDENT_AMBULATORY_CARE_PROVIDER_SITE_OTHER): Payer: Medicare Other

## 2022-03-11 DIAGNOSIS — Z7901 Long term (current) use of anticoagulants: Secondary | ICD-10-CM | POA: Diagnosis not present

## 2022-03-11 DIAGNOSIS — I2699 Other pulmonary embolism without acute cor pulmonale: Secondary | ICD-10-CM

## 2022-03-11 LAB — POCT INR: INR: 2.7 (ref 2.0–3.0)

## 2022-03-11 NOTE — Patient Instructions (Signed)
Description   Continue 1 tablet ('5mg'$ ) daily, except 2 tablets ('10mg'$ ) on Monday, Wednesday and Friday.  Recheck INR 4 weeks.  Coumadin Clinic [phone]

## 2022-03-23 ENCOUNTER — Other Ambulatory Visit: Payer: Self-pay | Admitting: Internal Medicine

## 2022-03-23 ENCOUNTER — Other Ambulatory Visit (HOSPITAL_COMMUNITY): Payer: Self-pay | Admitting: Internal Medicine

## 2022-03-25 ENCOUNTER — Other Ambulatory Visit (HOSPITAL_COMMUNITY): Payer: Self-pay

## 2022-03-26 ENCOUNTER — Ambulatory Visit (INDEPENDENT_AMBULATORY_CARE_PROVIDER_SITE_OTHER): Payer: Medicare Other

## 2022-03-26 DIAGNOSIS — I428 Other cardiomyopathies: Secondary | ICD-10-CM | POA: Diagnosis not present

## 2022-03-26 LAB — CUP PACEART REMOTE DEVICE CHECK
Battery Remaining Longevity: 66 mo
Battery Remaining Percentage: 65 %
Battery Voltage: 2.96 V
Brady Statistic RV Percent Paced: 1 %
Date Time Interrogation Session: 20230627020023
HighPow Impedance: 66 Ohm
HighPow Impedance: 66 Ohm
Implantable Lead Implant Date: 20190918
Implantable Lead Location: 753860
Implantable Lead Model: 7122
Implantable Pulse Generator Implant Date: 20190918
Lead Channel Impedance Value: 430 Ohm
Lead Channel Pacing Threshold Amplitude: 0.5 V
Lead Channel Pacing Threshold Pulse Width: 0.5 ms
Lead Channel Sensing Intrinsic Amplitude: 12 mV
Lead Channel Setting Pacing Amplitude: 2.5 V
Lead Channel Setting Pacing Pulse Width: 0.5 ms
Lead Channel Setting Sensing Sensitivity: 0.5 mV
Pulse Gen Serial Number: 9796641

## 2022-03-28 ENCOUNTER — Ambulatory Visit (INDEPENDENT_AMBULATORY_CARE_PROVIDER_SITE_OTHER): Payer: Medicare Other | Admitting: Physician Assistant

## 2022-03-28 ENCOUNTER — Telehealth: Payer: Self-pay | Admitting: *Deleted

## 2022-03-28 ENCOUNTER — Encounter: Payer: Self-pay | Admitting: Physician Assistant

## 2022-03-28 ENCOUNTER — Telehealth: Payer: Self-pay

## 2022-03-28 VITALS — BP 94/60 | HR 82 | Ht 66.0 in | Wt 164.0 lb

## 2022-03-28 DIAGNOSIS — D689 Coagulation defect, unspecified: Secondary | ICD-10-CM

## 2022-03-28 DIAGNOSIS — I5022 Chronic systolic (congestive) heart failure: Secondary | ICD-10-CM

## 2022-03-28 DIAGNOSIS — Z7901 Long term (current) use of anticoagulants: Secondary | ICD-10-CM

## 2022-03-28 DIAGNOSIS — Z86010 Personal history of colonic polyps: Secondary | ICD-10-CM | POA: Diagnosis not present

## 2022-03-28 NOTE — Telephone Encounter (Signed)
[facility] Pre-operative Risk Assessment     Request for surgical clearance:     Endoscopy Procedure  What type of surgery is being performed?     Colonoscopy   When is this surgery scheduled?     TBD  What type of clearance is required ?   Pharmacy  Are there any medications that need to be held prior to surgery and how long? Coumadin 5 days   Practice name and name of physician performing surgery?      [facility]  Dr Havery Moros   What is your office phone and fax number?      Phone- [phone]  [phone]  Anesthesia type (None, local, MAC, general) ?       MAC

## 2022-03-28 NOTE — Telephone Encounter (Signed)
-----   Message from Levin Erp, [place] sent at 03/28/2022  1:59 PM EDT ----- Regarding: Please call Please call and tell the patient to Dr. Havery Moros due to his case, he is actually not due until next year for repeat colonoscopy given a change in guidelines recently.  Please put him in recall for that timeframe.  Thanks, JLL ----- Message ----- From: Yetta Flock, MD Sent: 03/28/2022  12:58 PM EDT To: Levin Erp, PA     ----- Message ----- From: Levin Erp, [place] Sent: 03/28/2022  11:50 AM EDT To: Yetta Flock, MD

## 2022-03-28 NOTE — Progress Notes (Signed)
Chief Complaint: History of adenomatous polyps  HPI:    Jonathan Dickson is a 66 year old African-American male with a past medical history as listed below including a blood clotting disorder on Coumadin and combined systolic and diastolic CHF with his last echo showing decreased EF at 30-35% in 2022, known to Dr. Havery Moros, who was referred to me by Antony Blackbird, MD for history of adenomatous polyps.    05/09/2016 colonoscopy with one 8 mm polyp in the ascending colon, diverticulosis in the entire colon and otherwise normal.  Pathology showed tubular adenoma and repeat recommended in 5 years.    Today, patient presents to clinic and tells me he has no problems at all.  His last colonoscopy had to be done in the hospital and he was off of his Coumadin and had to use Lovenox shots.    Denies fever, chills, weight loss, change in bowel habits, abdominal pain, heartburn or reflux.  Past Medical History:  Diagnosis Date   Abnormal TSH 02/2016   AICD (automatic cardioverter/defibrillator) present    Anxiety    Blood clotting disorder (HCC)    Bronchiectasis (HCC)    left lower lung   Cardiac amyloidosis (HCC)    Cervical disc disease    Chronic combined systolic and diastolic CHF (congestive heart failure) ([place])    a. 01/2016 Echo: EF 25%, inf AK, diffuse sev HK, Gr1 DD, mild MR.   Coronary atherosclerosis    Depression    Diabetes mellitus without complication ([place])    Dyspnea    Dysrhythmia    Full thickness rotator cuff tear 06/2021   MRI   History of DVT (deep vein thrombosis) 08/04/2019   venous doppler US   History of hiatal hernia    Hx of adenomatous polyp of colon    Hyperglycemia    Hyperlipidemia    Hypertension    Hypertensive heart disease    Myocardial infarction [facility])    ??  maybe   NICM (nonischemic cardiomyopathy) ([place])    a. 01/2016 Echo: EF 25%, inf AK, diffuse sev HK, Gr1 DD, mild MR; b. 01/2016 MV: EF 22%, no isch/infarct;  c. 02/2016 Cath: Nl cors, EF 35-45%.    Osteoarthritis of hips, bilateral    Paraseptal emphysema (HCC)    mild   Peanut allergy    Pneumonia    Pre-diabetes    Presence of implantable cardioverter-defibrillator (ICD)    S/P repair of ventral hernia    Seizures (HCC)    with penicillin   Sensorineural hearing loss     Past Surgical History:  Procedure Laterality Date   ANTERIOR CERVICAL DECOMP/DISCECTOMY FUSION N/A 07/10/2016   Procedure: ANTERIOR CERVICAL DECOMPRESSION FUSION CERVICAL 4-5, CERVICAL 5-6, CERVICAL 6-7 WITH INSTRUMENTATION AND ALLOGRAFT;  Surgeon: [name], MD;  Location: [place];  Service: Orthopedics;  Laterality: N/A;   ANTERIOR CERVICAL DECOMP/DISCECTOMY FUSION N/A 04/19/2021   Procedure: ANTERIOR CERVICAL DECOMPRESSION FUSION CERVICAL 3- CERVICAL 4 WITH INSTRUMENTATION AND ALLOGRAFT;  Surgeon: [name], MD;  Location: [place];  Service: Orthopedics;  Laterality: N/A;   CARDIAC CATHETERIZATION N/A 03/11/2016   Procedure: Left Heart Cath and Coronary Angiography;  Surgeon: [name], MD;  Location: [place] [facility];  Service: Cardiovascular;  Laterality: N/A;   COLONOSCOPY     ICD IMPLANT N/A 06/17/2018   Procedure: ICD IMPLANT;  Surgeon: [name], MD;  Location: [place] [facility];  Service: Cardiovascular;  Laterality: N/A;   Thumb surgery Right    VENTRAL HERNIA  REPAIR N/A 10/25/2019   Procedure: PRIMARY VENTRAL HERNIA REPAIR;  Surgeon: [name], MD;  Location: [facility];  Service: General;  Laterality: N/A;    Current Outpatient Medications  Medication Sig Dispense Refill   acetaminophen (TYLENOL) 500 MG tablet Take 1,000 mg by mouth every 6 (six) hours as needed for moderate pain or headache.     albuterol (VENTOLIN HFA) 108 (90 Base) MCG/ACT inhaler Inhale 2 puffs into the lungs every 6 (six) hours as needed for wheezing or shortness of breath. 8 g 2   Aromatic Inhalants (VICKS VAPOINHALER) INHA Inhale 1 Dose into the lungs daily as needed (congestion).      atorvastatin (LIPITOR) 80 MG tablet Take 1 tablet (80 mg total) by mouth daily. 90 tablet 3   carvedilol (COREG) 12.5 MG tablet Take 1 tablet (12.5 mg total) by mouth 2 (two) times daily with a meal. 180 tablet 0   Chlorphen-Phenyleph-ASA (ALKA-SELTZER PLUS COLD PO) Take 1-2 tablets by mouth 2 (two) times daily as needed (cold symptoms).      Cholecalciferol (VITAMIN D) 50 MCG (2000 UT) tablet Take 2,000 Units by mouth daily.     cyclobenzaprine (FLEXERIL) 5 MG tablet Take 1 tablet (5 mg total) by mouth 3 (three) times daily as needed for muscle spasms. 30 tablet 1   dapagliflozin propanediol (FARXIGA) 10 MG TABS tablet Take 1 tablet (10 mg total) by mouth daily. NEEDS FOLLOW UP APPOINTMENT FOR ANYMORE REFILLS 90 tablet 0   diclofenac Sodium (VOLTAREN) 1 % GEL Apply 2 g topically 4 (four) times daily as needed (pain).     fluticasone (FLONASE) 50 MCG/ACT nasal spray Place 1 spray into both nostrils daily. 18.2 mL 3   furosemide (LASIX) 40 MG tablet Take 1 tablet (40 mg total) by mouth daily as needed. For fluid (if weight gain more than 3 lbs in 1 day) (Patient taking differently: Take 40 mg by mouth daily as needed for fluid (weight gain of more than 3lbs in 1 day).) 30 tablet 6   loratadine (CLARITIN) 10 MG tablet Take 1 tablet (10 mg total) by mouth daily. 30 tablet 5   metFORMIN (GLUCOPHAGE) 500 MG tablet TAKE 1 TABLET(500 MG) BY MOUTH TWICE DAILY WITH A MEAL 60 tablet 0   methocarbamol (ROBAXIN) 500 MG tablet Take 1 tablet (500 mg total) by mouth every 6 (six) hours as needed for muscle spasms. 60 tablet 0   sacubitril-valsartan (ENTRESTO) 49-51 MG Take 1 tablet by mouth 2 (two) times daily. 60 tablet 11   spironolactone (ALDACTONE) 25 MG tablet TAKE 1/2 TABLET(12.5 MG) BY MOUTH DAILY (Patient taking differently: Take 12.5 mg by mouth daily.) 15 tablet 11   Tafamidis 61 MG CAPS Take 1 capsule by mouth daily. (Patient taking differently: Take 61 mg by mouth daily.) 30 capsule 11   warfarin  (COUMADIN) 5 MG tablet TAKE 2 TABLETS BY MOUTH DAILY AS DIRECTED BY COUMADIN CLINIC 180 tablet 0   No current facility-administered medications for this visit.    Allergies as of 03/28/2022 - Review Complete 03/28/2022  Allergen Reaction Noted   Peanut-containing drug products Swelling 09/27/2014   Penicillins Other (See Comments) 10/22/2012   Decadron [dexamethasone] Itching 07/12/2016   Ivp dye [iodinated contrast media]  01/29/2022    Family History  Problem Relation Age of Onset   Diabetes Mother    Diabetes Sister    Clotting disorder Neg Hx    Lung disease Neg Hx    Colon cancer Neg Hx  Esophageal cancer Neg Hx    Stomach cancer Neg Hx    Pancreatic cancer Neg Hx     Social History   Socioeconomic History   Marital status: Legally Separated    Spouse name: Not on file   Number of children: 3   Years of education: Not on file   Highest education level: Not on file  Occupational History   Not on file  Tobacco Use   Smoking status: Former    Packs/day: 1.50    Years: 18.00    Total pack years: 27.00    Types: Cigarettes    Quit date: 03/15/2003    Years since quitting: 19.0   Smokeless tobacco: Never  Vaping Use   Vaping Use: Never used  Substance and Sexual Activity   Alcohol use: No    Comment: none since 2004   Drug use: No    Comment: former  none since 2004   Sexual activity: Not Currently  Other Topics Concern   Not on file  Social History Narrative   Not on file   Social Determinants of Health   Financial Resource Strain: Not on file  Food Insecurity: Not on file  Transportation Needs: Not on file  Physical Activity: Not on file  Stress: Not on file  Social Connections: Not on file  Intimate Partner Violence: Not on file    Review of Systems:    Constitutional: No weight loss, fever or chills Cardiovascular: No chest pain Respiratory: No SOB Gastrointestinal: See HPI and otherwise negative   Physical Exam:  Vital signs: BP 94/60    Pulse 82   Ht '5\' 6"'$  (1.676 m)   Wt 164 lb (74.4 kg)   BMI 26.47 kg/m    Constitutional:   Very Pleasant AA male appears to be in NAD, Well developed, Well nourished, alert and cooperative Respiratory: Respirations even and unlabored. Lungs clear to auscultation bilaterally.   No wheezes, crackles, or rhonchi.  Cardiovascular: Normal S1, S2. No MRG. Regular rate and rhythm. No peripheral edema, cyanosis or pallor.  Gastrointestinal:  Soft, nondistended, nontender. No rebound or guarding. Normal bowel sounds. No appreciable masses or hepatomegaly. Rectal:  Not performed.  Psychiatric: Oriented to person, place and time. Demonstrates good judgement and reason without abnormal affect or behaviors.  No recent labs or imaging.  Assessment: 1.  History of adenomatous polyps: Last colonoscopy in August 2017 with recommendations to repeat in 5 years 2.  Clotting disorder: On Coumadin 3.  CHF with EF less than 35%  Plan: 1.  Patient needs a repeat colonoscopy for surveillance of adenomatous polyps.  This will need to take place in the hospital given his decreased ejection fraction.  He has been placed on a wait list with Dr. Havery Moros.  He verbalized understanding.  He is not currently having any symptoms, so this is not urgent.  He will call if anything develops. 2.  Patient was advised that at time of colonoscopy he will need to hold his Coumadin for 5 days prior to time procedure.  We will go ahead and communicate with his prescribing physician to ensure this is acceptable for him. 3.  Patient will get a phone call from our clinic when we are able to schedule him.  Ellouise Newer, PA-C [facility] 03/28/2022, 11:33 AM  Cc: Antony Blackbird, MD

## 2022-03-28 NOTE — Progress Notes (Signed)
Thanks for seeing this patient Jonathan Dickson.  Upon reviewing his colonoscopy, and per updated national guidelines for surveillance of polyps, his recall can actually be extended to 7 years from his last exam, so would be next due in August 2024.  His recall should have been updated reflecting this change, not sure why it was not yet.  Can you let him know I think okay to wait till August of next year for his colonoscopy.  We want to minimize the amount of times he needs to be sedated with anesthesia given his low EF, and minimize amount of times he needs to hold his Coumadin.  I am comfortable waiting till next year if he is.  Can you let me know if he thinks otherwise?  Thanks

## 2022-03-28 NOTE — Patient Instructions (Signed)
You have been scheduled for a colonoscopy. Please follow written instructions given to you at your visit today.  Please pick up your prep supplies at the pharmacy within the next 1-3 days. If you use inhalers (even only as needed), please bring them with you on the day of your procedure.   ( You have been put on a wait list with Dr Havery Moros for a hospital case) We will contact you when an opening becomes available  You will be contacted by our office prior to your procedure for directions on holding your Coumadin.  If you do not hear from our office 1 week prior to your scheduled procedure, please call [phone] to discuss.( We will go ahead and get approval for you to come off your coumadin)   Follow up per recommendations after procedure  If you are age 27 or older, your body mass index should be between 23-30. Your Body mass index is 26.47 kg/m. If this is out of the aforementioned range listed, please consider follow up with your Primary Care Provider.  If you are age 59 or younger, your body mass index should be between 19-25. Your Body mass index is 26.47 kg/m. If this is out of the aformentioned range listed, please consider follow up with your Primary Care Provider.   ________________________________________________________  The [facility] providers would like to encourage you to use [facility] to communicate with providers for non-urgent requests or questions.  Due to long hold times on the telephone, sending your provider a message by [facility] may be a faster and more efficient way to get a response.  Please allow 48 business hours for a response.  Please remember that this is for non-urgent requests.  _______________________________________________________   Due to recent changes in healthcare laws, you may see the results of your imaging and laboratory studies on MyChart before your provider has had a chance to review them.  We understand that in some cases there may be results that  are confusing or concerning to you. Not all laboratory results come back in the same time frame and the provider may be waiting for multiple results in order to interpret others.  Please give [place] 48 hours in order for your provider to thoroughly review all the results before contacting the office for clarification of your results.    I appreciate the  opportunity to care for you  Thank You   Lovett Calender

## 2022-03-28 NOTE — Telephone Encounter (Signed)
[place] colonoscopy appt cancelled.  Colonoscopy recall updated in epic for 05/2023  Called patient and notified him of cancellation. I informed him that based on the updated guidelines his colonoscopy will not be due until 05/2023. Pt is aware that he will receive a letter when it is time to schedule. Pt verbalized understanding and had no concerns at the end of the call.

## 2022-03-29 NOTE — Addendum Note (Signed)
Addended by: Marcelle Overlie D on: 03/29/2022 04:44 PM   Modules accepted: Orders

## 2022-03-29 NOTE — Telephone Encounter (Signed)
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    Procedure: colonoscopy Date of procedure: TBD  CrCl ? Platelet count ?  Patient will need BMP and CBC for Lovenox dosing. Please have patient get labs done at [place] office. Orders placed. Patient will need to let coumadin clinic know when procedure is scheduled so they can coordinate bridge.  Per office protocol, patient can hold warfarin for 5 days prior to procedure.   Patient WILL need bridging with Lovenox (enoxaparin) around procedure.  **This guidance is not considered finalized until pre-operative APP has relayed final recommendations.**

## 2022-04-01 ENCOUNTER — Encounter: Payer: Medicare Other | Admitting: Student

## 2022-04-03 ENCOUNTER — Other Ambulatory Visit (HOSPITAL_COMMUNITY): Payer: Self-pay | Admitting: Family Medicine

## 2022-04-03 ENCOUNTER — Other Ambulatory Visit (HOSPITAL_COMMUNITY): Payer: Self-pay

## 2022-04-03 ENCOUNTER — Other Ambulatory Visit: Payer: Self-pay | Admitting: Internal Medicine

## 2022-04-03 DIAGNOSIS — E119 Type 2 diabetes mellitus without complications: Secondary | ICD-10-CM

## 2022-04-04 NOTE — Telephone Encounter (Signed)
Requested medication (s) are due for refill today- yes  Requested medication (s) are on the active medication list -yes  Future visit scheduled -no  Last refill: 02/04/22 #60  Notes to clinic: Call to patient to schedule appointment- he states he no longer comes to this office.  Requested Prescriptions  Pending Prescriptions Disp Refills   metFORMIN (GLUCOPHAGE) 500 MG tablet [Pharmacy Med Name: METFORMIN 500MG TABLETS] 30 tablet     Sig: TAKE 1 TABLET BY MOUTH DAILY WITH BREAKFAST     Endocrinology:  Diabetes - Biguanides Failed - 04/03/2022  5:20 PM      Failed - Cr in normal range and within 360 days    Creat  Date Value Ref Range Status  03/21/2016 0.98 0.70 - 1.25 mg/dL Final    Comment:      For patients > or = 67 years of age: The upper reference limit for Creatinine is approximately 13% higher for people identified as African-American.      Creatinine, Ser  Date Value Ref Range Status  08/17/2021 1.25 (H) 0.61 - 1.24 mg/dL Final         Failed - B12 Level in normal range and within 720 days    No results found for: "VITAMINB12"       Passed - HBA1C is between 0 and 7.9 and within 180 days    Hemoglobin A1C  Date Value Ref Range Status  11/08/2021 6.8 (A) 4.0 - 5.6 % Final   HbA1c, POC (controlled diabetic range)  Date Value Ref Range Status  10/30/2020 6.4 0.0 - 7.0 % Final   Hgb A1c MFr Bld  Date Value Ref Range Status  04/10/2021 7.4 (H) 4.8 - 5.6 % Final    Comment:    (NOTE) Pre diabetes:          5.7%-6.4%  Diabetes:              >6.4%  Glycemic control for   <7.0% adults with diabetes          Passed - eGFR in normal range and within 360 days    GFR, Est African American  Date Value Ref Range Status  10/04/2015 >89 >=60 mL/min Final   GFR calc Af Amer  Date Value Ref Range Status  07/21/2020 62 >59 mL/min/1.73 Final    Comment:    **In accordance with recommendations from the NKF-ASN Task force,**   [facility] is in the process of updating  its eGFR calculation to the   2021 CKD-EPI creatinine equation that estimates kidney function   without a race variable.    GFR, Est Non African American  Date Value Ref Range Status  10/04/2015 >89 >=60 mL/min Final    Comment:      The estimated GFR is a calculation valid for adults (>=81 years old) that uses the CKD-EPI algorithm to adjust for age and sex. It is   not to be used for children, pregnant women, hospitalized patients,    patients on dialysis, or with rapidly changing kidney function. According to the NKDEP, eGFR >89 is normal, 60-89 shows mild impairment, 30-59 shows moderate impairment, 15-29 shows severe impairment and <15 is ESRD.      GFR, Estimated  Date Value Ref Range Status  08/17/2021 >60 >60 mL/min Final    Comment:    (NOTE) Calculated using the CKD-EPI Creatinine Equation (2021)    eGFR  Date Value Ref Range Status  04/04/2021 66 >59 mL/min/1.73 Final  Passed - Valid encounter within last 6 months    Recent Outpatient Visits           4 months ago Type 2 diabetes mellitus without complication, without long-term current use of insulin [place])   [place], [place], [place]   9 months ago Type 2 diabetes mellitus without complication, without long-term current use of insulin [facility])   [place], [place], [place]   11 months ago Patient left before evaluation by physician   [facility] Ladell Pier, MD   1 year ago Encounter for Medicare annual wellness exam   [place] Ladell Pier, MD   1 year ago Interstitial pulmonary fibrosis [facility])   [place] Elsie Stain, MD       Future Appointments             In 5 days Spero Geralds, MD [facility]            Passed - CBC within normal limits and completed in the last 12 months    WBC  Date  Value Ref Range Status  08/17/2021 6.8 4.0 - 10.5 K/uL Final   RBC  Date Value Ref Range Status  08/17/2021 5.63 4.22 - 5.81 MIL/uL Final   Hemoglobin  Date Value Ref Range Status  08/17/2021 14.2 13.0 - 17.0 g/dL Final  10/30/2020 13.7 13.0 - 17.7 g/dL Final   HCT  Date Value Ref Range Status  08/17/2021 45.4 39.0 - 52.0 % Final   Hematocrit  Date Value Ref Range Status  10/30/2020 43.0 37.5 - 51.0 % Final   MCHC  Date Value Ref Range Status  08/17/2021 31.3 30.0 - 36.0 g/dL Final   [facility]  Date Value Ref Range Status  08/17/2021 25.2 (L) 26.0 - 34.0 pg Final   MCV  Date Value Ref Range Status  08/17/2021 80.6 80.0 - 100.0 fL Final  10/30/2020 80 79 - 97 fL Final   No results found for: "PLTCOUNTKUC", "LABPLAT", "POCPLA" RDW  Date Value Ref Range Status  08/17/2021 16.9 (H) 11.5 - 15.5 % Final  10/30/2020 15.5 (H) 11.6 - 15.4 % Final            Requested Prescriptions  Pending Prescriptions Disp Refills   metFORMIN (GLUCOPHAGE) 500 MG tablet [Pharmacy Med Name: METFORMIN 500MG TABLETS] 30 tablet     Sig: TAKE 1 TABLET BY MOUTH DAILY WITH BREAKFAST     Endocrinology:  Diabetes - Biguanides Failed - 04/03/2022  5:20 PM      Failed - Cr in normal range and within 360 days    Creat  Date Value Ref Range Status  03/21/2016 0.98 0.70 - 1.25 mg/dL Final    Comment:      For patients > or = 67 years of age: The upper reference limit for Creatinine is approximately 13% higher for people identified as African-American.      Creatinine, Ser  Date Value Ref Range Status  08/17/2021 1.25 (H) 0.61 - 1.24 mg/dL Final         Failed - B12 Level in normal range and within 720 days    No results found for: "VITAMINB12"       Passed - HBA1C is between 0 and 7.9 and within 180 days    Hemoglobin A1C  Date Value Ref Range Status  11/08/2021 6.8 (  A) 4.0 - 5.6 % Final   HbA1c, POC (controlled diabetic range)  Date Value Ref Range Status  10/30/2020 6.4 0.0 - 7.0 %  Final   Hgb A1c MFr Bld  Date Value Ref Range Status  04/10/2021 7.4 (H) 4.8 - 5.6 % Final    Comment:    (NOTE) Pre diabetes:          5.7%-6.4%  Diabetes:              >6.4%  Glycemic control for   <7.0% adults with diabetes          Passed - eGFR in normal range and within 360 days    GFR, Est African American  Date Value Ref Range Status  10/04/2015 >89 >=60 mL/min Final   GFR calc Af Amer  Date Value Ref Range Status  07/21/2020 62 >59 mL/min/1.73 Final    Comment:    **In accordance with recommendations from the NKF-ASN Task force,**   [facility] is in the process of updating its eGFR calculation to the   2021 CKD-EPI creatinine equation that estimates kidney function   without a race variable.    GFR, Est Non African American  Date Value Ref Range Status  10/04/2015 >89 >=60 mL/min Final    Comment:      The estimated GFR is a calculation valid for adults (>=58 years old) that uses the CKD-EPI algorithm to adjust for age and sex. It is   not to be used for children, pregnant women, hospitalized patients,    patients on dialysis, or with rapidly changing kidney function. According to the NKDEP, eGFR >89 is normal, 60-89 shows mild impairment, 30-59 shows moderate impairment, 15-29 shows severe impairment and <15 is ESRD.      GFR, Estimated  Date Value Ref Range Status  08/17/2021 >60 >60 mL/min Final    Comment:    (NOTE) Calculated using the CKD-EPI Creatinine Equation (2021)    eGFR  Date Value Ref Range Status  04/04/2021 66 >59 mL/min/1.73 Final         Passed - Valid encounter within last 6 months    Recent Outpatient Visits           4 months ago Type 2 diabetes mellitus without complication, without long-term current use of insulin [facility])   [place], [place], [place]   9 months ago Type 2 diabetes mellitus without complication, without long-term current use of insulin [facility])   [place], [place], [place]   11 months ago Patient left before evaluation by [place] Ladell Pier, MD   1 year ago Encounter for Medicare annual wellness exam   [place] Ladell Pier, MD   1 year ago Interstitial pulmonary fibrosis [facility])   [place] Elsie Stain, MD       Future Appointments             In 5 days Spero Geralds, MD [facility]            Passed - CBC within normal limits and completed in the last 12 months    WBC  Date Value Ref Range Status  08/17/2021 6.8 4.0 - 10.5 K/uL Final   RBC  Date Value Ref Range Status  08/17/2021 5.63 4.22 - 5.81 MIL/uL Final   Hemoglobin  Date Value Ref Range Status  08/17/2021 14.2 13.0 - 17.0 g/dL Final  10/30/2020 13.7 13.0 - 17.7 g/dL Final   HCT  Date Value Ref Range Status  08/17/2021 45.4 39.0 - 52.0 % Final   Hematocrit  Date Value Ref Range Status  10/30/2020 43.0 37.5 - 51.0 % Final   MCHC  Date Value Ref Range Status  08/17/2021 31.3 30.0 - 36.0 g/dL Final   [facility]  Date Value Ref Range Status  08/17/2021 25.2 (L) 26.0 - 34.0 pg Final   MCV  Date Value Ref Range Status  08/17/2021 80.6 80.0 - 100.0 fL Final  10/30/2020 80 79 - 97 fL Final   No results found for: "PLTCOUNTKUC", "LABPLAT", "POCPLA" RDW  Date Value Ref Range Status  08/17/2021 16.9 (H) 11.5 - 15.5 % Final  10/30/2020 15.5 (H) 11.6 - 15.4 % Final

## 2022-04-08 ENCOUNTER — Ambulatory Visit (INDEPENDENT_AMBULATORY_CARE_PROVIDER_SITE_OTHER): Payer: Medicare Other

## 2022-04-08 ENCOUNTER — Telehealth: Payer: Self-pay | Admitting: Gastroenterology

## 2022-04-08 ENCOUNTER — Ambulatory Visit: Payer: Medicare Other | Admitting: Internal Medicine

## 2022-04-08 DIAGNOSIS — I2699 Other pulmonary embolism without acute cor pulmonale: Secondary | ICD-10-CM | POA: Diagnosis not present

## 2022-04-08 DIAGNOSIS — Z7901 Long term (current) use of anticoagulants: Secondary | ICD-10-CM

## 2022-04-08 LAB — POCT INR: INR: 3.2 — AB (ref 2.0–3.0)

## 2022-04-08 NOTE — Telephone Encounter (Signed)
Patient called a little confused.  When he last saw Dr. Havery Moros, he was told he wasn't due for his colonoscopy until 08/24.  However, his cardiologist sent him for bloodwork regarding his Coumadin for the colonoscopy.  Can you please call patient and clarify for him?  Thank you.

## 2022-04-08 NOTE — Patient Instructions (Signed)
Description   Eat greens today and then continue 1 tablet ('5mg'$ ) daily, except 2 tablets ('10mg'$ ) on Monday, Wednesday and Friday.  Recheck INR 4 weeks.  Coumadin Clinic [phone]

## 2022-04-08 NOTE — Telephone Encounter (Signed)
Returned call to patient. I informed him that he can disregard coumadin hold at this time. Pt has been advised that he is correct, his recall colonoscopy is not due until 05/2023. Pt has been advised that he will receive a letter closer to that time. Pt verbalized understanding and had no concerns at the end of the call.

## 2022-04-09 ENCOUNTER — Ambulatory Visit: Payer: Medicare Other | Admitting: Internal Medicine

## 2022-04-09 LAB — CBC
Hematocrit: 39.7 % (ref 37.5–51.0)
Hemoglobin: 12.9 g/dL — ABNORMAL LOW (ref 13.0–17.7)
MCH: 25.6 pg — ABNORMAL LOW (ref 26.6–33.0)
MCHC: 32.5 g/dL (ref 31.5–35.7)
MCV: 79 fL (ref 79–97)
Platelets: 225 10*3/uL (ref 150–450)
RBC: 5.03 x10E6/uL (ref 4.14–5.80)
RDW: 16 % — ABNORMAL HIGH (ref 11.6–15.4)
WBC: 5.3 10*3/uL (ref 3.4–10.8)

## 2022-04-09 LAB — BASIC METABOLIC PANEL
BUN/Creatinine Ratio: 16 (ref 10–24)
BUN: 17 mg/dL (ref 8–27)
CO2: 16 mmol/L — ABNORMAL LOW (ref 20–29)
Calcium: 9.6 mg/dL (ref 8.6–10.2)
Chloride: 106 mmol/L (ref 96–106)
Creatinine, Ser: 1.05 mg/dL (ref 0.76–1.27)
Glucose: 86 mg/dL (ref 70–99)
Potassium: 4.6 mmol/L (ref 3.5–5.2)
Sodium: 135 mmol/L (ref 134–144)
eGFR: 78 mL/min/{1.73_m2} (ref >59)

## 2022-04-10 NOTE — Telephone Encounter (Signed)
Procedure has been cancelled per note from [facility] 04/04/22. Plan is for 05/2023.

## 2022-04-16 ENCOUNTER — Ambulatory Visit (INDEPENDENT_AMBULATORY_CARE_PROVIDER_SITE_OTHER): Payer: Medicare Other | Admitting: Podiatry

## 2022-04-16 ENCOUNTER — Encounter: Payer: Self-pay | Admitting: Podiatry

## 2022-04-16 ENCOUNTER — Encounter: Payer: Medicare Other | Admitting: Gastroenterology

## 2022-04-16 DIAGNOSIS — E119 Type 2 diabetes mellitus without complications: Secondary | ICD-10-CM

## 2022-04-16 DIAGNOSIS — D689 Coagulation defect, unspecified: Secondary | ICD-10-CM | POA: Diagnosis not present

## 2022-04-16 DIAGNOSIS — M79675 Pain in left toe(s): Secondary | ICD-10-CM | POA: Diagnosis not present

## 2022-04-16 DIAGNOSIS — M79674 Pain in right toe(s): Secondary | ICD-10-CM

## 2022-04-16 DIAGNOSIS — B351 Tinea unguium: Secondary | ICD-10-CM

## 2022-04-16 NOTE — Progress Notes (Signed)
This patient returns to my office for at risk foot care.  This patient requires this care by a professional since this patient will be at risk due to having coagulation defect, diabetes and peripheral claudication.  Patient is taking coumadin.  This patient is unable to cut nails himself since the patient cannot reach his nails.These nails are painful walking and wearing shoes.  This patient presents for at risk foot care today.  General Appearance  Alert, conversant and in no acute stress.  Vascular  Dorsalis pedis and posterior tibial  pulses are weakly palpable  bilaterally.  Capillary return is within normal limits  bilaterally. Cold feet bilaterally. Absent digital hair.  Neurologic  Senn-Weinstein monofilament wire test within normal limits  bilaterally. Muscle power within normal limits bilaterally.  Nails Thick disfigured discolored nails with subungual debris  from hallux to fifth toes bilaterally. No evidence of bacterial infection or drainage bilaterally.  Orthopedic  No limitations of motion  feet .  No crepitus or effusions noted.  No bony pathology or digital deformities noted.  Skin  normotropic skin with no porokeratosis noted bilaterally.  No signs of infections or ulcers noted.     Onychomycosis  Pain in right toes  Pain in left toes  Consent was obtained for treatment procedures.   Mechanical debridement of nails 1-5  bilaterally performed with a nail nipper.  Filed with dremel without incident.    Return office visit   3  months                   Told patient to return for periodic foot care and evaluation due to potential at risk complications.   Gardiner Barefoot DPM

## 2022-04-17 NOTE — Progress Notes (Signed)
Remote ICD transmission.   

## 2022-04-26 ENCOUNTER — Other Ambulatory Visit (HOSPITAL_COMMUNITY): Payer: Self-pay

## 2022-04-29 ENCOUNTER — Other Ambulatory Visit (HOSPITAL_COMMUNITY): Payer: Self-pay

## 2022-05-05 ENCOUNTER — Other Ambulatory Visit (HOSPITAL_COMMUNITY): Payer: Self-pay | Admitting: Family Medicine

## 2022-05-05 DIAGNOSIS — I428 Other cardiomyopathies: Secondary | ICD-10-CM

## 2022-05-05 DIAGNOSIS — I10 Essential (primary) hypertension: Secondary | ICD-10-CM

## 2022-05-05 DIAGNOSIS — I5022 Chronic systolic (congestive) heart failure: Secondary | ICD-10-CM

## 2022-05-06 ENCOUNTER — Ambulatory Visit (INDEPENDENT_AMBULATORY_CARE_PROVIDER_SITE_OTHER): Payer: Medicare Other | Admitting: *Deleted

## 2022-05-06 DIAGNOSIS — I2699 Other pulmonary embolism without acute cor pulmonale: Secondary | ICD-10-CM

## 2022-05-06 DIAGNOSIS — I824Y9 Acute embolism and thrombosis of unspecified deep veins of unspecified proximal lower extremity: Secondary | ICD-10-CM | POA: Diagnosis not present

## 2022-05-06 DIAGNOSIS — Z7901 Long term (current) use of anticoagulants: Secondary | ICD-10-CM

## 2022-05-06 LAB — POCT INR: INR: 2.5 (ref 2.0–3.0)

## 2022-05-06 NOTE — Patient Instructions (Signed)
Description   Continue taking 1 tablet ('5mg'$ ) daily, except 2 tablets ('10mg'$ ) on Monday, Wednesday and Friday. Recheck INR 4 weeks. [facility] [phone]

## 2022-05-20 ENCOUNTER — Telehealth: Payer: Self-pay | Admitting: Cardiovascular Disease

## 2022-05-20 MED ORDER — ATORVASTATIN CALCIUM 80 MG PO TABS: 80.0000 mg | ORAL_TABLET | Freq: Every day | ORAL | 0 refills | Status: DC

## 2022-05-20 NOTE — Telephone Encounter (Signed)
*  STAT* If patient is at the pharmacy, call can be transferred to refill team.   1. Which medications need to be refilled? (please list name of each medication and dose if known) atorvastatin (LIPITOR) 80 MG tablet  2. Which pharmacy/location (including street and city if local pharmacy) is medication to be sent to? [place], [place]  3. Do they need a 30 day or 90 day supply? 90 day supply   Patient only has 3 tablets left.

## 2022-05-23 ENCOUNTER — Other Ambulatory Visit (HOSPITAL_COMMUNITY): Payer: Self-pay

## 2022-05-30 ENCOUNTER — Other Ambulatory Visit (HOSPITAL_COMMUNITY): Payer: Self-pay

## 2022-06-06 ENCOUNTER — Ambulatory Visit: Payer: Medicare Other | Attending: Cardiovascular Disease

## 2022-06-06 ENCOUNTER — Other Ambulatory Visit (HOSPITAL_COMMUNITY): Payer: Self-pay

## 2022-06-06 DIAGNOSIS — Z7901 Long term (current) use of anticoagulants: Secondary | ICD-10-CM

## 2022-06-06 DIAGNOSIS — I2699 Other pulmonary embolism without acute cor pulmonale: Secondary | ICD-10-CM

## 2022-06-06 LAB — POCT INR: INR: 3.6 — AB (ref 2.0–3.0)

## 2022-06-06 NOTE — Patient Instructions (Signed)
HOLD TOMORROW and then Continue taking 1 tablet ('5mg'$ ) daily, except 2 tablets ('10mg'$ ) on Monday, Wednesday and Friday. Recheck INR 4 weeks. [facility] [phone]

## 2022-06-12 ENCOUNTER — Other Ambulatory Visit: Payer: Self-pay | Admitting: Family Medicine

## 2022-06-12 DIAGNOSIS — E119 Type 2 diabetes mellitus without complications: Secondary | ICD-10-CM

## 2022-06-13 ENCOUNTER — Ambulatory Visit (HOSPITAL_COMMUNITY)
Admission: EM | Admit: 2022-06-13 | Discharge: 2022-06-13 | Disposition: A | Discharge status: Home / Self Care | Payer: Medicare Other | Attending: Family Medicine | Admitting: Family Medicine

## 2022-06-13 ENCOUNTER — Encounter (HOSPITAL_COMMUNITY): Payer: Self-pay | Admitting: Emergency Medicine

## 2022-06-13 ENCOUNTER — Other Ambulatory Visit: Payer: Self-pay

## 2022-06-13 DIAGNOSIS — Z20822 Contact with and (suspected) exposure to covid-19: Secondary | ICD-10-CM | POA: Diagnosis present

## 2022-06-13 LAB — SARS CORONAVIRUS 2 (TAT 6-24 HRS): SARS Coronavirus 2: NEGATIVE

## 2022-06-13 NOTE — ED Provider Notes (Signed)
[facility]    CSN: [id number] Arrival date & time: 06/13/22  0820      History   Chief Complaint Chief Complaint  Patient presents with   covid test    HPI Jonathan Dickson is a 66 y.o. male.   He is here for covid test.  Exposed to someone 2 days ago.  Was told last night she was positive for covid.  He is having a headache, but thinks this is a tension headache.  No fevers/chills.  No URI symptoms noted.  He did not do a covid test at home.         Past Medical History:  Diagnosis Date   Abnormal TSH 02/2016   AICD (automatic cardioverter/defibrillator) present    Anxiety    Blood clotting disorder (HCC)    Bronchiectasis (HCC)    left lower lung   Cardiac amyloidosis (HCC)    Cervical disc disease    Chronic combined systolic and diastolic CHF (congestive heart failure) ([place])    a. 01/2016 Echo: EF 25%, inf AK, diffuse sev HK, Gr1 DD, mild MR.   Coronary atherosclerosis    Depression    Diabetes mellitus without complication ([place])    Dyspnea    Dysrhythmia    Full thickness rotator cuff tear 06/2021   MRI   History of DVT (deep vein thrombosis) 08/04/2019   venous doppler US   History of hiatal hernia    Hx of adenomatous polyp of colon    Hyperglycemia    Hyperlipidemia    Hypertension    Hypertensive heart disease    Myocardial infarction [facility])    ??  maybe   NICM (nonischemic cardiomyopathy) ([place])    a. 01/2016 Echo: EF 25%, inf AK, diffuse sev HK, Gr1 DD, mild MR; b. 01/2016 MV: EF 22%, no isch/infarct;  c. 02/2016 Cath: Nl cors, EF 35-45%.   Osteoarthritis of hips, bilateral    Paraseptal emphysema (HCC)    mild   Peanut allergy    Pneumonia    Pre-diabetes    Presence of implantable cardioverter-defibrillator (ICD)    S/P repair of ventral hernia    Seizures ([place])    with penicillin   Sensorineural hearing loss     Patient Active Problem List   Diagnosis Date Noted   Upper airway cough syndrome 01/07/2022   Chronic rhinitis  01/07/2022   Bronchiectasis without complication ([place]) 72/05/4708   Interstitial pulmonary fibrosis ([place]) 02/27/2021   Influenza vaccine needed 10/30/2020   COVID-19 vaccine series completed 10/30/2020   Dyspnea on exertion 10/30/2020   ICD (implantable cardioverter-defibrillator) in place 05/16/2020   Blood clotting disorder ([place]) 03/08/2020   Long term (current) use of anticoagulants 08/06/2019   DVT (deep venous thrombosis) ([place]) 08/04/2019   Hypertensive retinopathy 06/30/2019   Pain due to onychomycosis of toenails of both feet 05/28/2019   Hyperlipidemia associated with type 2 diabetes mellitus ([place]) 05/15/2019   Type 2 diabetes mellitus without complication, without long-term current use of insulin ([place]) 05/13/2019   Cardiac amyloidosis ([place]) 62/83/6629   Chronic systolic heart failure ([place]) 06/17/2018   Heart failure ([place]) 05/11/2018   Claudication in peripheral vascular disease ([place]) 12/31/2017   Sensorineural hearing loss (SNHL) of both ears 11/25/2017   Decreased hearing of both ears 11/10/2017   Cervical disc disease with myelopathy 07/10/2016   NICM (nonischemic cardiomyopathy) ([place]) 03/12/2016   Congestive dilated cardiomyopathy ([place]) 03/11/2016   Cervical radiculitis 04/19/2015   Essential hypertension 09/27/2014  Past Surgical History:  Procedure Laterality Date   ANTERIOR CERVICAL DECOMP/DISCECTOMY FUSION N/A 07/10/2016   Procedure: ANTERIOR CERVICAL DECOMPRESSION FUSION CERVICAL 4-5, CERVICAL 5-6, CERVICAL 6-7 WITH INSTRUMENTATION AND ALLOGRAFT;  Surgeon: [name], MD;  Location: [facility];  Service: Orthopedics;  Laterality: N/A;   ANTERIOR CERVICAL DECOMP/DISCECTOMY FUSION N/A 04/19/2021   Procedure: ANTERIOR CERVICAL DECOMPRESSION FUSION CERVICAL 3- CERVICAL 4 WITH INSTRUMENTATION AND ALLOGRAFT;  Surgeon: [name], MD;  Location: [facility];  Service: Orthopedics;  Laterality: N/A;   CARDIAC CATHETERIZATION N/A 03/11/2016   Procedure: Left Heart Cath and  Coronary Angiography;  Surgeon: [name], MD;  Location: [facility];  Service: Cardiovascular;  Laterality: N/A;   COLONOSCOPY     ICD IMPLANT N/A 06/17/2018   Procedure: ICD IMPLANT;  Surgeon: [name], MD;  Location: [facility];  Service: Cardiovascular;  Laterality: N/A;   Thumb surgery Right    VENTRAL HERNIA REPAIR N/A 10/25/2019   Procedure: PRIMARY VENTRAL HERNIA REPAIR;  Surgeon: [name], MD;  Location: [facility];  Service: General;  Laterality: N/A;       Home Medications    Prior to Admission medications   Medication Sig Start Date End Date Taking? Authorizing Provider  acetaminophen (TYLENOL) 500 MG tablet Take 1,000 mg by mouth every 6 (six) hours as needed for moderate pain or headache.    [provider]  albuterol (VENTOLIN HFA) 108 (90 Base) MCG/ACT inhaler Inhale 2 puffs into the lungs every 6 (six) hours as needed for wheezing or shortness of breath. 01/07/22   Cobb, Karie Schwalbe, NP  Aromatic Inhalants (VICKS VAPOINHALER) INHA Inhale 1 Dose into the lungs daily as needed (congestion).    [provider]  atorvastatin (LIPITOR) 80 MG tablet Take 1 tablet (80 mg total) by mouth daily. 05/20/22   Lorretta Harp, MD  carvedilol (COREG) 12.5 MG tablet TAKE 1 TABLET(12.5 MG) BY MOUTH TWICE DAILY WITH A MEAL 05/06/22   [place]ford, [place], FNP  Chlorphen-Phenyleph-ASA (ALKA-SELTZER PLUS COLD PO) Take 1-2 tablets by mouth 2 (two) times daily as needed (cold symptoms).     [provider]  Cholecalciferol (VITAMIN D) 50 MCG (2000 UT) tablet Take 2,000 Units by mouth daily.    [provider]  cyclobenzaprine (FLEXERIL) 5 MG tablet Take 1 tablet (5 mg total) by mouth 3 (three) times daily as needed for muscle spasms. 11/08/21   Argentina Donovan, PA-C  dapagliflozin propanediol (FARXIGA) 10 MG TABS tablet Take 1 tablet (10 mg total) by mouth daily. NEEDS FOLLOW UP APPOINTMENT FOR ANYMORE REFILLS 03/25/22    Bensimhon, Shaune Pascal, MD  diclofenac Sodium (VOLTAREN) 1 % GEL Apply 2 g topically 4 (four) times daily as needed (pain).    [provider]  ENTRESTO 49-51 MG TAKE 1 TABLET BY MOUTH TWICE DAILY 04/04/22   Larey Dresser, MD  fluticasone [facility]) 50 MCG/ACT nasal spray Place 1 spray into both nostrils daily. 01/07/22   Cobb, Karie Schwalbe, NP  furosemide (LASIX) 40 MG tablet Take 1 tablet (40 mg total) by mouth daily as needed. For fluid (if weight gain more than 3 lbs in 1 day) Patient taking differently: Take 40 mg by mouth daily as needed for fluid (weight gain of more than 3lbs in 1 day). 08/27/21   Milford, Maricela Bo, FNP  loratadine (CLARITIN) 10 MG tablet Take 1 tablet (10 mg total) by mouth daily. 01/07/22   Cobb, Karie Schwalbe, NP  metFORMIN (GLUCOPHAGE)  500 MG tablet TAKE 1 TABLET(500 MG) BY MOUTH TWICE DAILY WITH A MEAL 02/04/22   Charlott Rakes, MD  methocarbamol (ROBAXIN) 500 MG tablet Take 1 tablet (500 mg total) by mouth every 6 (six) hours as needed for muscle spasms. 04/19/21   McKenzie, Lennie Muckle, PA-C  spironolactone (ALDACTONE) 25 MG tablet TAKE 1/2 TABLET(12.5 MG) BY MOUTH DAILY Patient taking differently: Take 12.5 mg by mouth daily. 03/19/21   Bensimhon, Shaune Pascal, MD  Tafamidis 61 MG CAPS Take 1 capsule by mouth daily. Patient taking differently: Take 61 mg by mouth daily. 09/21/21   Bensimhon, Shaune Pascal, MD  warfarin (COUMADIN) 5 MG tablet TAKE 2 TABLETS BY MOUTH DAILY AS DIRECTED BY COUMADIN CLINIC 03/25/22   Hilty, Nadean Corwin, MD    Family History Family History  Problem Relation Age of Onset   Diabetes Mother    Diabetes Sister    Clotting disorder Neg Hx    Lung disease Neg Hx    Colon cancer Neg Hx    Esophageal cancer Neg Hx    Stomach cancer Neg Hx    Pancreatic cancer Neg Hx     Social History Social History   Tobacco Use   Smoking status: Former    Packs/day: 1.50    Years: 18.00    Total pack years: 27.00    Types: Cigarettes    Quit date: 03/15/2003     Years since quitting: 19.2   Smokeless tobacco: Never  Vaping Use   Vaping Use: Never used  Substance Use Topics   Alcohol use: No    Comment: none since 2004   Drug use: No    Comment: former  none since 2004     Allergies   Peanut-containing drug products, Penicillins, Decadron [dexamethasone], and Ivp dye [iodinated contrast media]   Review of Systems Review of Systems  Constitutional: Negative.   HENT: Negative.    Respiratory: Negative.    Cardiovascular: Negative.   Gastrointestinal: Negative.   Genitourinary: Negative.   Musculoskeletal: Negative.   Neurological:  Positive for headaches.     Physical Exam Triage Vital Signs ED Triage Vitals  Enc Vitals Group     BP 06/13/22 0903 130/85     Pulse Rate 06/13/22 0903 73     Resp 06/13/22 0903 20     Temp 06/13/22 0903 98.1 F (36.7 C)     Temp Source 06/13/22 0903 Oral     SpO2 06/13/22 0903 98 %     Weight --      Height --      Head Circumference --      Peak Flow --      Pain Score 06/13/22 0902 0     Pain Loc --      Pain Edu? --      Excl. in [place]? --    No data found.  Updated Vital Signs BP 130/85 (BP Location: Right Arm)   Pulse 73   Temp 98.1 F (36.7 C) (Oral)   Resp 20   SpO2 98%   Visual Acuity Right Eye Distance:   Left Eye Distance:   Bilateral Distance:    Right Eye Near:   Left Eye Near:    Bilateral Near:     Physical Exam Constitutional:      Appearance: Normal appearance.  HENT:     Head: Normocephalic.  Cardiovascular:     Rate and Rhythm: Normal rate and regular rhythm.  Pulmonary:     Effort: Pulmonary  effort is normal.     Breath sounds: Normal breath sounds.  Musculoskeletal:     Cervical back: Normal range of motion.  Skin:    General: Skin is warm.  Neurological:     General: No focal deficit present.     Mental Status: He is alert.  Psychiatric:        Mood and Affect: Mood normal.      UC Treatments / Results  Labs (all labs ordered are  listed, but only abnormal results are displayed) Labs Reviewed  SARS CORONAVIRUS 2 (TAT 6-24 HRS)    EKG   Radiology No results found.  Procedures Procedures (including critical care time)  Medications Ordered in UC Medications - No data to display  Initial Impression / Assessment and Plan / UC Course  I have reviewed the triage vital signs and the nursing notes.  Pertinent labs & imaging results that were available during my care of the patient were reviewed by me and considered in my medical decision making (see chart for details).  Patient seen today for covid testing after exposure.  No symptoms currently other than slight headache.  If positive may treat with molnupiravir.    Final Clinical Impressions(s) / UC Diagnoses   Final diagnoses:  Exposure to COVID-19 virus     Discharge Instructions      You were seen today for covid testing after exposure.  Your test will be resulted tomorrow and you will be called if positive.  Please self-isolate until results are known.  If positive we could initiate treatment at that time.     ED Prescriptions   None    PDMP not reviewed this encounter.   Rondel Oh, MD 06/13/22 [phone]

## 2022-06-13 NOTE — ED Triage Notes (Signed)
Was exposed to covid 2 nights ago.  Patient has a headache. Patient thinks this is tension related secondary to hearing he was exposed.  Patient requesting covid test

## 2022-06-13 NOTE — ED Notes (Signed)
Specimen labeled at bedside, verified with patient and placed in lab

## 2022-06-13 NOTE — Discharge Instructions (Signed)
You were seen today for covid testing after exposure.  Your test will be resulted tomorrow and you will be called if positive.  Please self-isolate until results are known.  If positive we could initiate treatment at that time.

## 2022-06-19 LAB — HM DIABETES EYE EXAM

## 2022-06-20 ENCOUNTER — Other Ambulatory Visit: Payer: Self-pay | Admitting: Internal Medicine

## 2022-06-20 ENCOUNTER — Other Ambulatory Visit: Payer: Self-pay

## 2022-06-20 DIAGNOSIS — I824Y9 Acute embolism and thrombosis of unspecified deep veins of unspecified proximal lower extremity: Secondary | ICD-10-CM

## 2022-06-20 DIAGNOSIS — I2699 Other pulmonary embolism without acute cor pulmonale: Secondary | ICD-10-CM

## 2022-06-20 MED ORDER — DAPAGLIFLOZIN PROPANEDIOL 10 MG PO TABS: 10.0000 mg | ORAL_TABLET | Freq: Every day | ORAL | 0 refills | Status: DC

## 2022-06-20 NOTE — Telephone Encounter (Signed)
Dr. [name] appt on 07/02/22

## 2022-06-24 ENCOUNTER — Other Ambulatory Visit (HOSPITAL_COMMUNITY): Payer: Self-pay

## 2022-06-25 ENCOUNTER — Ambulatory Visit (INDEPENDENT_AMBULATORY_CARE_PROVIDER_SITE_OTHER): Payer: Medicare Other

## 2022-06-25 DIAGNOSIS — I428 Other cardiomyopathies: Secondary | ICD-10-CM

## 2022-06-26 ENCOUNTER — Other Ambulatory Visit (HOSPITAL_COMMUNITY): Payer: Self-pay

## 2022-06-26 LAB — CUP PACEART REMOTE DEVICE CHECK
Battery Remaining Longevity: 65 mo
Battery Remaining Percentage: 63 %
Battery Voltage: 2.96 V
Brady Statistic RV Percent Paced: 1 %
Date Time Interrogation Session: 20230926020015
HighPow Impedance: 65 Ohm
HighPow Impedance: 65 Ohm
Implantable Lead Implant Date: 20190918
Implantable Lead Location: 753860
Implantable Lead Model: 7122
Implantable Pulse Generator Implant Date: 20190918
Lead Channel Impedance Value: 430 Ohm
Lead Channel Pacing Threshold Amplitude: 0.5 V
Lead Channel Pacing Threshold Pulse Width: 0.5 ms
Lead Channel Sensing Intrinsic Amplitude: 11.2 mV
Lead Channel Setting Pacing Amplitude: 2.5 V
Lead Channel Setting Pacing Pulse Width: 0.5 ms
Lead Channel Setting Sensing Sensitivity: 0.5 mV
Pulse Gen Serial Number: 9796641

## 2022-06-27 ENCOUNTER — Other Ambulatory Visit (HOSPITAL_COMMUNITY): Payer: Self-pay

## 2022-07-02 ENCOUNTER — Encounter: Payer: Self-pay | Admitting: Cardiovascular Disease

## 2022-07-02 ENCOUNTER — Ambulatory Visit: Payer: Medicare Other | Attending: Cardiovascular Disease | Admitting: Cardiovascular Disease

## 2022-07-02 VITALS — BP 98/60 | HR 86 | Ht 66.0 in | Wt 166.2 lb

## 2022-07-02 DIAGNOSIS — E785 Hyperlipidemia, unspecified: Secondary | ICD-10-CM

## 2022-07-02 DIAGNOSIS — I420 Dilated cardiomyopathy: Secondary | ICD-10-CM | POA: Diagnosis not present

## 2022-07-02 DIAGNOSIS — I428 Other cardiomyopathies: Secondary | ICD-10-CM

## 2022-07-02 DIAGNOSIS — I10 Essential (primary) hypertension: Secondary | ICD-10-CM | POA: Diagnosis not present

## 2022-07-02 DIAGNOSIS — E1169 Type 2 diabetes mellitus with other specified complication: Secondary | ICD-10-CM

## 2022-07-02 NOTE — Assessment & Plan Note (Signed)
History of essential hypertension a blood pressure measured today at 98/60.  He is on Entresto and carvedilol.

## 2022-07-02 NOTE — Patient Instructions (Signed)
  Follow-Up: At [facility], you and your health needs are our priority.  As part of our continuing mission to provide you with exceptional heart care, we have created designated Provider Care Teams.  These Care Teams include your primary Cardiologist (physician) and Advanced Practice Providers (APPs -  Physician Assistants and Nurse Practitioners) who all work together to provide you with the care you need, when you need it.  We recommend signing up for the patient portal called "MyChart".  Sign up information is provided on this After Visit Summary.  MyChart is used to connect with patients for Virtual Visits (Telemedicine).  Patients are able to view lab/test results, encounter notes, upcoming appointments, etc.  Non-urgent messages can be sent to your provider as well.   To learn more about what you can do with MyChart, go to [email or web address].    Your next appointment:   12 month(s)  The format for your next appointment:   In Person  Provider:   Jonathan Berry, MD            

## 2022-07-02 NOTE — Assessment & Plan Note (Signed)
History of nonischemic cardiomyopathy status post right left heart cath by Dr. Ellyn Hack 03/11/2016 revealing normal coronary arteries.  He was seen by Dr. Haroldine Laws who optimize his got guideline directed optimal medical therapy.  Cardiac MRI did show TTR/cardiac amyloidosis and he was begun on appropriate medications.  He had an ICD implanted by Dr. Curt Bears 06/17/2018 for primary prevention.  His most recent 2D echocardiogram performed 05/09/2021 revealed EF of 30 to 35% with global hypokinesia, grade 1 diastolic dysfunction.  There were no significant valvular abnormalities noted.  He is completely asymptomatic.

## 2022-07-02 NOTE — Assessment & Plan Note (Signed)
History of hyperlipidemia on statin therapy with lipid profile performed 05/16/2020 revealing total cholesterol 153, LDL of 82 and HDL 41.

## 2022-07-02 NOTE — Progress Notes (Signed)
07/02/2022 [name]   1955/10/03  [id number]  Primary Physician Pcp, No Primary Cardiologist: Lorretta Harp MD [name], [place]  HPI:  Jonathan Dickson is a 66 y.o.   fit appearing widowed African-American male father of 59, grandfather to 3 grandchildren.  I last saw him in the office 05/16/2020.  He was scheduled to have cervical laminectomy today but this was put off because of need for cardiac clearance.His only risk factor is hypertension. Smoking 13 years ago. He's never had a heart attack or stroke. He denies chest pain or shortness of breath. I performed 2-D echocardiography which revealed unexpectedly ejection fraction of 25% with inferior wall akinesis and severe hypokinesia otherwise. A Myoview stress test confirmed the deep diminished ejection fraction but did not show any areas of ischemia or infarction. 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 He underwent right left heart cath by Dr. Ellyn Hack 03/11/16 confirming this. Ultimately, he is begun on carvedilol and lisinopril. His ejection fraction improved up to 35-40% by 2-D echocardiogram performed 06/21/16. He is completely symptomatic. Based on this I cleared  him for for his elective cervical laminectomy and mildly elevated risk.   Because of her current symptoms of shortness of breath a repeat echo was performed 01/05/2018 revealing EF of 30%.  I did refer him to Dr. Haroldine Laws. His heart failure medications were optimized.  He did have a cardiac MRI performed 04/01/2018 that showed an EF of 20% with changes consistent with cardiac amyloidosis and he was begun on medications for this.  He also had an ICD implanted by Dr. Curt Bears prophylactically 06/07/2018 and has had no discharges.  He feels clinically improved on his current medications.  He has had bilateral pulmonary emboli as well on Eliquis oral anticoagulation.   He complained of some left calf pain and had a venous ultrasound done at [facility]  remarkable for a left popliteal DVT which was acute.  He was hospitalized and deemed in Eliquis failure.  Lovenox was begun as was Coumadin oral anticoagulation.  Unfortunately, he left the hospital AMA .  He is apparently been compliant with his other medications.    Since I saw him 2 years ago he did see Dr. Haroldine Laws back in 2022.  His most recent echo performed 05/09/2021 revealed EF of 30 to 35% with global hypokinesia which is unchanged on guideline directed optimal medical therapy.   Current Meds  Medication Sig   acetaminophen (TYLENOL) 500 MG tablet Take 1,000 mg by mouth every 6 (six) hours as needed for moderate pain or headache.   albuterol (VENTOLIN HFA) 108 (90 Base) MCG/ACT inhaler Inhale 2 puffs into the lungs every 6 (six) hours as needed for wheezing or shortness of breath.   Aromatic Inhalants (VICKS VAPOINHALER) INHA Inhale 1 Dose into the lungs daily as needed (congestion).   atorvastatin (LIPITOR) 80 MG tablet Take 1 tablet (80 mg total) by mouth daily.   carvedilol (COREG) 12.5 MG tablet TAKE 1 TABLET(12.5 MG) BY MOUTH TWICE DAILY WITH A MEAL   Chlorphen-Phenyleph-ASA (ALKA-SELTZER PLUS COLD PO) Take 1-2 tablets by mouth 2 (two) times daily as needed (cold symptoms).    Cholecalciferol (VITAMIN D) 50 MCG (2000 UT) tablet Take 2,000 Units by mouth daily.   cyclobenzaprine (FLEXERIL) 5 MG tablet Take 1 tablet (5 mg total) by mouth 3 (three) times daily as needed for muscle spasms.   dapagliflozin propanediol (FARXIGA) 10 MG TABS tablet Take 1 tablet (10  mg total) by mouth daily. NEEDS FOLLOW UP APPOINTMENT FOR ANYMORE REFILLS   diclofenac Sodium (VOLTAREN) 1 % GEL Apply 2 g topically 4 (four) times daily as needed (pain).   ENTRESTO 49-51 MG TAKE 1 TABLET BY MOUTH TWICE DAILY   fluticasone (FLONASE) 50 MCG/ACT nasal spray Place 1 spray into both nostrils daily.   loratadine (CLARITIN) 10 MG tablet Take 1 tablet (10 mg total) by mouth daily.   methocarbamol (ROBAXIN) 500 MG  tablet Take 1 tablet (500 mg total) by mouth every 6 (six) hours as needed for muscle spasms.   spironolactone (ALDACTONE) 25 MG tablet TAKE 1/2 TABLET(12.5 MG) BY MOUTH DAILY (Patient taking differently: Take 12.5 mg by mouth daily.)   warfarin (COUMADIN) 5 MG tablet TAKE 2 TABLETS BY MOUTH DAILY AS DIRECTED BY COUMADIN CLINIC     Allergies  Allergen Reactions   Peanut-Containing Drug Products Swelling   Penicillins Other (See Comments)    CONVULSIONS Did it involve swelling of the face/tongue/throat, SOB, or low BP? No Did it involve sudden or severe rash/hives, skin peeling, or any reaction on the inside of your mouth or nose? No Did you need to seek medical attention at a hospital or doctor's office? Yes When did it last happen?      25 years If all above answers are "NO", may proceed with cephalosporin use.    Decadron [Dexamethasone] Itching   Ivp Dye [Iodinated Contrast Media]     Social History   Socioeconomic History   Marital status: Legally Separated    Spouse name: Not on file   Number of children: 3   Years of education: Not on file   Highest education level: Not on file  Occupational History   Not on file  Tobacco Use   Smoking status: Former    Packs/day: 1.50    Years: 18.00    Total pack years: 27.00    Types: Cigarettes    Quit date: 03/15/2003    Years since quitting: 19.3   Smokeless tobacco: Never  Vaping Use   Vaping Use: Never used  Substance and Sexual Activity   Alcohol use: No    Comment: none since 2004   Drug use: No    Comment: former  none since 2004   Sexual activity: Not Currently  Other Topics Concern   Not on file  Social History Narrative   Not on file   Social Determinants of Health   Financial Resource Strain: Not on file  Food Insecurity: Not on file  Transportation Needs: Not on file  Physical Activity: Not on file  Stress: Not on file  Social Connections: Not on file  Intimate Partner Violence: Not on file      Review of Systems: General: negative for chills, fever, night sweats or weight changes.  Cardiovascular: negative for chest pain, dyspnea on exertion, edema, orthopnea, palpitations, paroxysmal nocturnal dyspnea or shortness of breath Dermatological: negative for rash Respiratory: negative for cough or wheezing Urologic: negative for hematuria Abdominal: negative for nausea, vomiting, diarrhea, bright red blood per rectum, melena, or hematemesis Neurologic: negative for visual changes, syncope, or dizziness All other systems reviewed and are otherwise negative except as noted above.    Blood pressure 98/60, pulse 86, height '5\' 6"'$  (1.676 m), weight 166 lb 3.2 oz (75.4 kg), SpO2 96 %.  General appearance: alert and no distress Neck: no adenopathy, no carotid bruit, no JVD, supple, symmetrical, trachea midline, and thyroid not enlarged, symmetric, no tenderness/mass/nodules Lungs: clear to  auscultation bilaterally Heart: regular rate and rhythm, S1, S2 normal, no murmur, click, rub or gallop Extremities: extremities normal, atraumatic, no cyanosis or edema Pulses: 2+ and symmetric Skin: Skin color, texture, turgor normal. No rashes or lesions Neurologic: Grossly normal  EKG sinus rhythm at 86 with poor R wave progression.  I personally reviewed this EKG.  ASSESSMENT AND PLAN:   Essential hypertension History of essential hypertension a blood pressure measured today at 98/60.  He is on Entresto and carvedilol.  Congestive dilated cardiomyopathy (HCC) History of nonischemic cardiomyopathy status post right left heart cath by Dr. Ellyn Hack 03/11/2016 revealing normal coronary arteries.  He was seen by Dr. Haroldine Laws who optimize his got guideline directed optimal medical therapy.  Cardiac MRI did show TTR/cardiac amyloidosis and he was begun on appropriate medications.  He had an ICD implanted by Dr. Curt Bears 06/17/2018 for primary prevention.  His most recent 2D echocardiogram performed  05/09/2021 revealed EF of 30 to 35% with global hypokinesia, grade 1 diastolic dysfunction.  There were no significant valvular abnormalities noted.  He is completely asymptomatic.  Hyperlipidemia associated with type 2 diabetes mellitus ([place]) History of hyperlipidemia on statin therapy with lipid profile performed 05/16/2020 revealing total cholesterol 153, LDL of 82 and HDL 41.     Lorretta Harp MD FACP,FACC,FAHA, [facility] 07/02/2022 2:17 PM

## 2022-07-04 ENCOUNTER — Ambulatory Visit: Payer: Medicare Other | Attending: Cardiovascular Disease

## 2022-07-04 DIAGNOSIS — Z7901 Long term (current) use of anticoagulants: Secondary | ICD-10-CM | POA: Diagnosis not present

## 2022-07-04 DIAGNOSIS — I2699 Other pulmonary embolism without acute cor pulmonale: Secondary | ICD-10-CM | POA: Diagnosis not present

## 2022-07-04 LAB — POCT INR: INR: 2.5 (ref 2.0–3.0)

## 2022-07-04 NOTE — Patient Instructions (Signed)
Continue taking 1 tablet ('5mg'$ ) daily, except 2 tablets ('10mg'$ ) on Monday, Wednesday and Friday. Recheck INR 6 weeks. [facility] [phone]

## 2022-07-05 NOTE — Progress Notes (Signed)
Remote ICD transmission.   

## 2022-07-19 ENCOUNTER — Other Ambulatory Visit: Payer: Self-pay | Admitting: Internal Medicine

## 2022-07-19 ENCOUNTER — Telehealth: Payer: Self-pay | Admitting: Internal Medicine

## 2022-07-19 DIAGNOSIS — R058 Other specified cough: Secondary | ICD-10-CM

## 2022-07-19 MED ORDER — LORATADINE 10 MG PO TABS: 10.0000 mg | ORAL_TABLET | Freq: Every day | ORAL | 5 refills | Status: DC

## 2022-07-19 NOTE — Telephone Encounter (Signed)
Called patient and left voicemail that refills were being sent in per his request. And if he had any concerns call office back. Nothing further needed

## 2022-07-22 ENCOUNTER — Other Ambulatory Visit (HOSPITAL_COMMUNITY): Payer: Self-pay

## 2022-07-23 ENCOUNTER — Other Ambulatory Visit (HOSPITAL_COMMUNITY): Payer: Self-pay

## 2022-07-26 ENCOUNTER — Encounter: Payer: Self-pay | Admitting: Adult Health

## 2022-07-26 ENCOUNTER — Ambulatory Visit (INDEPENDENT_AMBULATORY_CARE_PROVIDER_SITE_OTHER): Payer: Medicare Other | Admitting: Adult Health

## 2022-07-26 ENCOUNTER — Other Ambulatory Visit: Payer: Self-pay

## 2022-07-26 DIAGNOSIS — R0609 Other forms of dyspnea: Secondary | ICD-10-CM | POA: Diagnosis not present

## 2022-07-26 DIAGNOSIS — J310 Chronic rhinitis: Secondary | ICD-10-CM | POA: Diagnosis not present

## 2022-07-26 DIAGNOSIS — Z86711 Personal history of pulmonary embolism: Secondary | ICD-10-CM

## 2022-07-26 DIAGNOSIS — I5022 Chronic systolic (congestive) heart failure: Secondary | ICD-10-CM | POA: Diagnosis not present

## 2022-07-26 DIAGNOSIS — J479 Bronchiectasis, uncomplicated: Secondary | ICD-10-CM

## 2022-07-26 MED ORDER — ALBUTEROL SULFATE HFA 108 (90 BASE) MCG/ACT IN AERS: 2.0000 | INHALATION_SPRAY | Freq: Four times a day (QID) | RESPIRATORY_TRACT | 3 refills | Status: DC | PRN

## 2022-07-26 NOTE — Assessment & Plan Note (Signed)
Appears euvolemic on exam.  Continue follow-up with cardiology and current regimen

## 2022-07-26 NOTE — Assessment & Plan Note (Addendum)
Hold Flonase for now.  Continue on Claritin daily.  Add in saline nasal spray twice daily and saline nasal gel at bedtime

## 2022-07-26 NOTE — Patient Instructions (Addendum)
Hold Flonase for now  Add saline nasal spray Twice daily   Add Saline nasal gel At bedtime   RSV vaccine as discussed  Continue on Coumadin , and clinic checks  Claritin daily  Albuterol inhaler As needed   Follow up with Dr. Shearon Stalls in 1 year and As needed

## 2022-07-26 NOTE — Assessment & Plan Note (Signed)
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  Patient patient given

## 2022-07-26 NOTE — Assessment & Plan Note (Addendum)
Dyspnea appears to be improving.  Suspect is multifactorial.  Continue with activity as tolerated.  High-resolution CT chest showed no evidence of interstitial lung disease.  Has some post inflammatory changes.  PFT showed some restrictive changes.  Albuterol inhaler as needed. Flu COVID and pneumonia vaccine are up-to-date.  Discussed RSV vaccine. Continue follow-up with cardiology for history of cardiomyopathy and congestive heart failure  Plan  Patient Instructions  Hold Flonase for now  Add saline nasal spray Twice daily   Add Saline nasal gel At bedtime   RSV vaccine as discussed  Continue on Coumadin , and clinic checks  Claritin daily  Albuterol inhaler As needed   Follow up with Dr. Shearon Stalls in 1 year and As needed

## 2022-07-26 NOTE — Progress Notes (Signed)
$'@Patient'J$  ID: Jonathan Dickson, male    DOB: Jun 08, 1956, 66 y.o.   MRN: [id number]  Chief Complaint  Patient presents with   Follow-up    Referring provider: No ref. provider found  HPI: 66 year old male former smoker followed for shortness of breath post COVID-19 infection in 2020.  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 Moderate sized hiatal hernia. Some linear scarring in the LLL and inferior segment of lingular. Isolated, mild cylindrical BTX LLL. Likely post infectious scarring. Small calcified granuloma in the RUL. No evidence of ILD.   2D echo May 09, 2021 EF at 30 to 64%, grade 1 diastolic dysfunction normal pulmonary artery systolic pressure, RV size is normal  07/26/2022 Follow up : Dyspnea  Patient returns for a 22-monthfollow-up.  Patient was seen June 2022 for chronic shortness of breath since COVID-19 infection in 2020.  Patient was set up for pulmonary function testing which showed moderate restriction with decreased diffusing capacity.  He was set up for high-resolution CT chest to rule out interstitial lung disease.  This was negative for ILD.  It did show some linear scarring in the left lower lobe and lingula.  Some mild bronchiectasis.  Felt to all be postinfectious scarring.  Since last visit patient says he is feeling better . Decreased dyspnea with activity . Activity tolerance is improving . Going to [employer] few times a week.  Complains of ongoing nasal congestion and stuffiness. Taking claritin and flonase. Complains nasal bleeding with blowing nose. No fever or discolored mucus   Patient is followed by cardiology for cardiomyopathy and  congestive heart failure.  Last echo was August 2022 that showed EF at 30 to 35%.  With grade 1 diastolic dysfunction.  Normal pulmonary artery systolic pressures. No increased edema.   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  Denies any known bleeding. INR earlier this month was 2.5.  CBC in July 2023 showed stable hemoglobin.  Flu , covid booster , PVX are utd .      Allergies  Allergen Reactions   Peanut-Containing Drug Products Swelling   Penicillins Other (See Comments)    CONVULSIONS Did it involve swelling of the face/tongue/throat, SOB, or low BP? No Did it involve sudden or severe rash/hives, skin peeling, or any reaction on the inside of your mouth or nose? No Did you need to seek medical attention at a hospital or Dickson's office? Yes When did it last happen?      25 years If all above answers are "NO", may proceed with cephalosporin use.    Decadron [Dexamethasone] Itching   Ivp Dye [Iodinated Contrast Media]     Immunization History  Administered Date(s) Administered   Influenza,inj,Quad PF,6+ Mos 05/21/2016, 06/21/2019, 10/30/2020, 06/27/2021   Influenza-Unspecified 05/31/2022   Moderna Sars-Covid-2 Vaccination 11/25/2019, 12/23/2019, 07/12/2020   Pneumococcal Conjugate-13 01/16/2021   Pneumococcal Polysaccharide-23 05/21/2016   Tdap 11/26/2016, 02/15/2019   Zoster Recombinat (Shingrix) 03/06/2021    Past Medical History:  Diagnosis Date   Abnormal TSH 02/2016   AICD (automatic cardioverter/defibrillator) present    Anxiety    Blood clotting disorder ([place]    Bronchiectasis ([place]  left lower lung   Cardiac amyloidosis (HCC)    Cervical disc disease    Chronic combined systolic and diastolic CHF (congestive heart failure) ([place])    a. 01/2016 Echo: EF 25%, inf AK, diffuse sev HK, Gr1 DD, mild MR.   Coronary atherosclerosis    Depression    Diabetes mellitus without complication ([place])    Dyspnea    Dysrhythmia     Full thickness rotator cuff tear 06/2021   MRI   History of DVT (deep vein thrombosis) 08/04/2019   venous doppler US   History of hiatal hernia    Hx of adenomatous polyp of colon    Hyperglycemia    Hyperlipidemia    Hypertension    Hypertensive heart disease    Myocardial infarction [facility])    ??  maybe   NICM (nonischemic cardiomyopathy) ([place])    a. 01/2016 Echo: EF 25%, inf AK, diffuse sev HK, Gr1 DD, mild MR; b. 01/2016 MV: EF 22%, no isch/infarct;  c. 02/2016 Cath: Nl cors, EF 35-45%.   Osteoarthritis of hips, bilateral    Paraseptal emphysema (HCC)    mild   Peanut allergy    Pneumonia    Pre-diabetes    Presence of implantable cardioverter-defibrillator (ICD)    S/P repair of ventral hernia    Seizures (HCC)    with penicillin   Sensorineural hearing loss     Tobacco History: Social History   Tobacco Use  Smoking Status Former   Packs/day: 1.50   Years: 18.00   Total pack years: 27.00   Types: Cigarettes   Quit date: 03/15/2003   Years since quitting: 19.3  Smokeless Tobacco Never   Counseling given: Not Answered   Outpatient Medications Prior to Visit  Medication Sig Dispense Refill   acetaminophen (TYLENOL) 500 MG tablet Take 1,000 mg by mouth every 6 (six) hours as needed for moderate pain or headache.     Aromatic Inhalants (VICKS VAPOINHALER) INHA Inhale 1 Dose into the lungs daily as needed (congestion).     atorvastatin (LIPITOR) 80 MG tablet Take 1 tablet (80 mg total) by mouth daily. 90 tablet 0   carvedilol (COREG) 12.5 MG tablet TAKE 1 TABLET(12.5 MG) BY MOUTH TWICE DAILY WITH A MEAL 180 tablet 0   Chlorphen-Phenyleph-ASA (ALKA-SELTZER PLUS COLD PO) Take 1-2 tablets by mouth 2 (two) times daily as needed (cold symptoms).      Cholecalciferol (VITAMIN D) 50 MCG (2000 UT) tablet Take 2,000 Units by mouth daily.     cyclobenzaprine (FLEXERIL) 5 MG tablet Take 1 tablet (5 mg total) by mouth 3 (three) times daily as needed for muscle spasms. 30 tablet 1    diclofenac Sodium (VOLTAREN) 1 % GEL Apply 2 g topically 4 (four) times daily as needed (pain).     ENTRESTO 49-51 MG TAKE 1 TABLET BY MOUTH TWICE DAILY 60 tablet 11   FARXIGA 10 MG TABS tablet TAKE 1 TABLET(10 MG) BY MOUTH DAILY. NEED FOLLOW UP APPOINTMENT FOR ANYMORE REFILLS 30 tablet 0   fluticasone (FLONASE) 50 MCG/ACT nasal spray Place 1 spray into both nostrils daily. 18.2 mL 3   loratadine (CLARITIN) 10 MG tablet Take 1 tablet (10 mg total) by mouth daily. 30 tablet 5   Misc. Devices MISC by Does not apply route. Flutter valve daily     spironolactone (ALDACTONE) 25 MG tablet TAKE 1/2 TABLET(12.5 MG) BY MOUTH DAILY (Patient taking differently: Take 12.5 mg by mouth daily.) 15 tablet 11   Tafamidis 61 MG  CAPS Take 1 capsule by mouth daily. 30 capsule 11   warfarin (COUMADIN) 5 MG tablet TAKE 2 TABLETS BY MOUTH DAILY AS DIRECTED BY COUMADIN CLINIC 150 tablet 0   albuterol (VENTOLIN HFA) 108 (90 Base) MCG/ACT inhaler Inhale 2 puffs into the lungs every 6 (six) hours as needed for wheezing or shortness of breath. 8 g 2   metFORMIN (GLUCOPHAGE) 500 MG tablet TAKE 1 TABLET(500 MG) BY MOUTH TWICE DAILY WITH A MEAL (Patient not taking: Reported on 07/26/2022) 60 tablet 0   methocarbamol (ROBAXIN) 500 MG tablet Take 1 tablet (500 mg total) by mouth every 6 (six) hours as needed for muscle spasms. (Patient not taking: Reported on 07/26/2022) 60 tablet 0   No facility-administered medications prior to visit.     Review of Systems:   Constitutional:   No  weight loss, night sweats,  Fevers, chills,  +fatigue, or  lassitude.  HEENT:   No headaches,  Difficulty swallowing,  Tooth/dental problems, or  Sore throat,                No sneezing, itching, ear ache, nasal congestion, post nasal drip,   CV:  No chest pain,  Orthopnea, PND, swelling in lower extremities, anasarca, dizziness, palpitations, syncope.   GI  No heartburn, indigestion, abdominal pain, nausea, vomiting, diarrhea, change in bowel  habits, loss of appetite, bloody stools.   Resp:   No chest wall deformity  Skin: no rash or lesions.  GU: no dysuria, change in color of urine, no urgency or frequency.  No flank pain, no hematuria   MS:  No joint pain or swelling.  No decreased range of motion.  No back pain.    Physical Exam  BP 116/68 (BP Location: Left Arm, Patient Position: Sitting, Cuff Size: Normal)   Pulse 72   Temp 97.6 F (36.4 C) (Oral)   Ht '5\' 6"'$  (1.676 m)   Wt 168 lb 6.4 oz (76.4 kg)   SpO2 100%   BMI 27.18 kg/m   GEN: A/Ox3; pleasant , NAD, well nourished    HEENT:  [place]/AT,  NOSE-clear, THROAT-clear, no lesions, no postnasal drip or exudate noted.   NECK:  Supple w/ fair ROM; no JVD; normal carotid impulses w/o bruits; no thyromegaly or nodules palpated; no lymphadenopathy.    RESP  Clear  P & A; w/o, wheezes/ rales/ or rhonchi. no accessory muscle use, no dullness to percussion  CARD:  RRR, no m/r/g, no peripheral edema, pulses intact, no cyanosis or clubbing.  GI:   Soft & nt; nml bowel sounds; no organomegaly or masses detected.   Musco: Warm bil, no deformities or joint swelling noted.   Neuro: alert, no focal deficits noted.    Skin: Warm, no lesions or rashes    Lab Results:  CBC     BNP   Imaging: No results found.       Latest Ref Rng & Units 01/19/2021    9:38 AM  PFT Results  FVC-Pre L 2.23   FVC-Predicted Pre % 66   Pre FEV1/FVC % % 84   FEV1-Pre L 1.86   FEV1-Predicted Pre % 73   DLCO uncorrected ml/min/mmHg 11.48   DLCO UNC% % 48   DLVA Predicted % 86   TLC L 4.01   TLC % Predicted % 64   RV % Predicted % 72     No results found for: "NITRICOXIDE"      Assessment & Plan:   Dyspnea on exertion Dyspnea  appears to be improving.  Suspect is multifactorial.  Continue with activity as tolerated.  High-resolution CT chest showed no evidence of interstitial lung disease.  Has some post inflammatory changes.  PFT showed some restrictive changes.   Albuterol inhaler as needed. Flu COVID and pneumonia vaccine are up-to-date.  Discussed RSV vaccine. Continue follow-up with cardiology for history of cardiomyopathy and congestive heart failure  Plan  Patient Instructions  Hold Flonase for now  Add saline nasal spray Twice daily   Add Saline nasal gel At bedtime   RSV vaccine as discussed  Continue on Coumadin , and clinic checks  Claritin daily  Albuterol inhaler As needed   Follow up with Dr. Shearon Stalls in 1 year and As needed         Chronic rhinitis Hold Flonase for now.  Continue on Claritin daily.  Add in saline nasal spray twice daily and saline nasal gel at bedtime  Hx of pulmonary embolus History of PE-on lifelong anticoagulant therapy continue on Coumadin.  Patient patient given  Chronic systolic heart failure ([place]) Appears euvolemic on exam.  Continue follow-up with cardiology and current regimen     Rexene Edison, NP 07/26/2022

## 2022-07-30 ENCOUNTER — Other Ambulatory Visit (HOSPITAL_COMMUNITY): Payer: Self-pay

## 2022-08-01 ENCOUNTER — Other Ambulatory Visit (HOSPITAL_COMMUNITY): Payer: Self-pay | Admitting: Family Medicine

## 2022-08-01 DIAGNOSIS — I10 Essential (primary) hypertension: Secondary | ICD-10-CM

## 2022-08-01 DIAGNOSIS — I428 Other cardiomyopathies: Secondary | ICD-10-CM

## 2022-08-01 DIAGNOSIS — I5022 Chronic systolic (congestive) heart failure: Secondary | ICD-10-CM

## 2022-08-13 ENCOUNTER — Ambulatory Visit: Payer: Medicare Other | Attending: Nurse Practitioner | Admitting: Nurse Practitioner

## 2022-08-13 ENCOUNTER — Encounter: Payer: Self-pay | Admitting: Nurse Practitioner

## 2022-08-13 VITALS — BP 112/79 | HR 80 | Temp 98.0°F | Ht 66.0 in | Wt 171.2 lb

## 2022-08-13 DIAGNOSIS — Z7689 Persons encountering health services in other specified circumstances: Secondary | ICD-10-CM | POA: Diagnosis not present

## 2022-08-13 DIAGNOSIS — E1169 Type 2 diabetes mellitus with other specified complication: Secondary | ICD-10-CM | POA: Diagnosis not present

## 2022-08-13 DIAGNOSIS — E119 Type 2 diabetes mellitus without complications: Secondary | ICD-10-CM

## 2022-08-13 DIAGNOSIS — E785 Hyperlipidemia, unspecified: Secondary | ICD-10-CM | POA: Diagnosis not present

## 2022-08-13 LAB — POCT GLYCOSYLATED HEMOGLOBIN (HGB A1C): HbA1c, POC (controlled diabetic range): 6.4 % (ref 0.0–7.0)

## 2022-08-13 MED ORDER — ATORVASTATIN CALCIUM 80 MG PO TABS: 80.0000 mg | ORAL_TABLET | Freq: Every day | ORAL | 3 refills | Status: DC

## 2022-08-13 MED ORDER — METFORMIN HCL 500 MG PO TABS: 500.0000 mg | ORAL_TABLET | Freq: Every day | ORAL | 1 refills | Status: DC

## 2022-08-13 NOTE — Progress Notes (Signed)
No concerns. 

## 2022-08-13 NOTE — Progress Notes (Signed)
Assessment & Plan:  Jonathan Dickson was seen today for hypertension and prediabetes.  Diagnoses and all orders for this visit:  Encounter to establish care Follow-up for physical  Type 2 diabetes mellitus without complication, without long-term current use of insulin (HCC) -     POCT glycosylated hemoglobin (Hb A1C) -     metFORMIN (GLUCOPHAGE) 500 MG tablet; Take 1 tablet (500 mg total) by mouth daily with breakfast.  Hyperlipidemia associated with type 2 diabetes mellitus (HCC) -     atorvastatin (LIPITOR) 80 MG tablet; Take 1 tablet (80 mg total) by mouth daily. INSTRUCTIONS: Work on a low fat, heart healthy diet and participate in regular aerobic exercise program by working out at least 150 minutes per week; 5 days a week-30 minutes per day. Avoid red meat/beef/steak,  fried foods. junk foods, sodas, sugary drinks, unhealthy snacking, alcohol and smoking.  Drink at least 80 oz of water per day and monitor your carbohydrate intake daily.     Patient has been counseled on age-appropriate routine health concerns for screening and prevention. These are reviewed and up-to-date. Referrals have been placed accordingly. Immunizations are up-to-date or declined.    Subjective:   Chief Complaint  Patient presents with   Hypertension   Prediabetes   HPI Jonathan Dickson 66 y.o. male who is a pleasant gentleman presents to office today to establish care.  States he was not happy with the last provider due to not being able to get sooner appointments with her when needed.  PMH: Hypertension, congestive dilated cardiomyopathy with ICD implanted for primary prevention, hyperlipidemia associated with type 2 diabetes, diabetes mellitus type 2   HTN Blood pressure is well controlled with carvedilol 12.5 mg twice daily, Entresto 49-51 mg twice daily spironolactone 25 mg daily BP Readings from Last 3 Encounters:  08/13/22 112/79  07/26/22 116/68  07/02/22 98/60      DM 2 Diabetes is well controlled  with Farxiga 10 mg daily.  He has not been taking metformin for several months.  We will resume metformin 500 mg daily instead of  500 mg twice daily.  LDL not quite at goal with high intensity statin. Lab Results  Component Value Date   HGBA1C 6.4 08/13/2022    Lab Results  Component Value Date   LDLCALC 82 05/16/2020     Review of Systems  Constitutional:  Negative for fever, malaise/fatigue and weight loss.  HENT: Negative.  Negative for nosebleeds.   Eyes: Negative.  Negative for blurred vision, double vision and photophobia.  Respiratory: Negative.  Negative for cough and shortness of breath.   Cardiovascular: Negative.  Negative for chest pain, palpitations and leg swelling.  Gastrointestinal: Negative.  Negative for heartburn, nausea and vomiting.  Musculoskeletal: Negative.  Negative for myalgias.  Neurological: Negative.  Negative for dizziness, focal weakness, seizures and headaches.  Psychiatric/Behavioral: Negative.  Negative for suicidal ideas.     Past Medical History:  Diagnosis Date   Abnormal TSH 02/2016   AICD (automatic cardioverter/defibrillator) present    Anxiety    Blood clotting disorder (HCC)    Bronchiectasis (HCC)    left lower lung   Cardiac amyloidosis (HCC)    Cervical disc disease    Chronic combined systolic and diastolic CHF (congestive heart failure) ([place])    a. 01/2016 Echo: EF 25%, inf AK, diffuse sev HK, Gr1 DD, mild MR.   Coronary atherosclerosis    Depression    Diabetes mellitus without complication (HCC)    Dyspnea  Dysrhythmia    Full thickness rotator cuff tear 06/2021   MRI   History of DVT (deep vein thrombosis) 08/04/2019   venous doppler US   History of hiatal hernia    Hx of adenomatous polyp of colon    Hyperglycemia    Hyperlipidemia    Hypertension    Hypertensive heart disease    Myocardial infarction [facility])    ??  maybe   NICM (nonischemic cardiomyopathy) ([place])    a. 01/2016 Echo: EF 25%, inf AK, diffuse sev HK,  Gr1 DD, mild MR; b. 01/2016 MV: EF 22%, no isch/infarct;  c. 02/2016 Cath: Nl cors, EF 35-45%.   Osteoarthritis of hips, bilateral    Paraseptal emphysema (HCC)    mild   Peanut allergy    Pneumonia    Pre-diabetes    Presence of implantable cardioverter-defibrillator (ICD)    S/P repair of ventral hernia    Seizures (HCC)    with penicillin   Sensorineural hearing loss     Past Surgical History:  Procedure Laterality Date   ANTERIOR CERVICAL DECOMP/DISCECTOMY FUSION N/A 07/10/2016   Procedure: ANTERIOR CERVICAL DECOMPRESSION FUSION CERVICAL 4-5, CERVICAL 5-6, CERVICAL 6-7 WITH INSTRUMENTATION AND ALLOGRAFT;  Surgeon: [name], MD;  Location: [facility];  Service: Orthopedics;  Laterality: N/A;   ANTERIOR CERVICAL DECOMP/DISCECTOMY FUSION N/A 04/19/2021   Procedure: ANTERIOR CERVICAL DECOMPRESSION FUSION CERVICAL 3- CERVICAL 4 WITH INSTRUMENTATION AND ALLOGRAFT;  Surgeon: [name], MD;  Location: [facility];  Service: Orthopedics;  Laterality: N/A;   CARDIAC CATHETERIZATION N/A 03/11/2016   Procedure: Left Heart Cath and Coronary Angiography;  Surgeon: [name], MD;  Location: [facility];  Service: Cardiovascular;  Laterality: N/A;   COLONOSCOPY     ICD IMPLANT N/A 06/17/2018   Procedure: ICD IMPLANT;  Surgeon: [name], MD;  Location: [facility] [facility];  Service: Cardiovascular;  Laterality: N/A;   Thumb surgery Right    VENTRAL HERNIA REPAIR N/A 10/25/2019   Procedure: PRIMARY VENTRAL HERNIA REPAIR;  Surgeon: [name], MD;  Location: [facility];  Service: General;  Laterality: N/A;    Family History  Problem Relation Age of Onset   Diabetes Mother    Diabetes Sister    Clotting disorder Neg Hx    Lung disease Neg Hx    Colon cancer Neg Hx    Esophageal cancer Neg Hx    Stomach cancer Neg Hx    Pancreatic cancer Neg Hx     Social History Reviewed with no changes to be made today.   Outpatient Medications Prior to Visit  Medication Sig  Dispense Refill   acetaminophen (TYLENOL) 500 MG tablet Take 1,000 mg by mouth every 6 (six) hours as needed for moderate pain or headache.     albuterol (VENTOLIN HFA) 108 (90 Base) MCG/ACT inhaler Inhale 2 puffs into the lungs every 6 (six) hours as needed for wheezing or shortness of breath. 18 g 3   Aromatic Inhalants (VICKS VAPOINHALER) INHA Inhale 1 Dose into the lungs daily as needed (congestion).     carvedilol (COREG) 12.5 MG tablet TAKE 1 TABLET(12.5 MG) BY MOUTH TWICE DAILY WITH A MEAL 60 tablet 0   Chlorphen-Phenyleph-ASA (ALKA-SELTZER PLUS COLD PO) Take 1-2 tablets by mouth 2 (two) times daily as needed (cold symptoms).      Cholecalciferol (VITAMIN D) 50 MCG (2000 UT) tablet Take 2,000 Units by mouth daily.     cyclobenzaprine (FLEXERIL) 5 MG tablet Take 1 tablet (5 mg  total) by mouth 3 (three) times daily as needed for muscle spasms. 30 tablet 1   diclofenac Sodium (VOLTAREN) 1 % GEL Apply 2 g topically 4 (four) times daily as needed (pain).     ENTRESTO 49-51 MG TAKE 1 TABLET BY MOUTH TWICE DAILY 60 tablet 11   FARXIGA 10 MG TABS tablet TAKE 1 TABLET(10 MG) BY MOUTH DAILY. NEED FOLLOW UP APPOINTMENT FOR ANYMORE REFILLS 30 tablet 0   fluticasone (FLONASE) 50 MCG/ACT nasal spray Place 1 spray into both nostrils daily. 18.2 mL 3   loratadine (CLARITIN) 10 MG tablet Take 1 tablet (10 mg total) by mouth daily. 30 tablet 5   methocarbamol (ROBAXIN) 500 MG tablet Take 1 tablet (500 mg total) by mouth every 6 (six) hours as needed for muscle spasms. 60 tablet 0   Misc. Devices MISC by Does not apply route. Flutter valve daily     spironolactone (ALDACTONE) 25 MG tablet TAKE 1/2 TABLET(12.5 MG) BY MOUTH DAILY (Patient taking differently: Take 12.5 mg by mouth daily.) 15 tablet 11   Tafamidis 61 MG CAPS Take 1 capsule by mouth daily. 30 capsule 11   warfarin (COUMADIN) 5 MG tablet TAKE 2 TABLETS BY MOUTH DAILY AS DIRECTED BY COUMADIN CLINIC 150 tablet 0   atorvastatin (LIPITOR) 80 MG tablet  Take 1 tablet (80 mg total) by mouth daily. 90 tablet 0   metFORMIN (GLUCOPHAGE) 500 MG tablet TAKE 1 TABLET(500 MG) BY MOUTH TWICE DAILY WITH A MEAL (Patient not taking: Reported on 07/26/2022) 60 tablet 0   No facility-administered medications prior to visit.    Allergies  Allergen Reactions   Peanut-Containing Drug Products Swelling   Penicillins Other (See Comments)    CONVULSIONS Did it involve swelling of the face/tongue/throat, SOB, or low BP? No Did it involve sudden or severe rash/hives, skin peeling, or any reaction on the inside of your mouth or nose? No Did you need to seek medical attention at a hospital or doctor's office? Yes When did it last happen?      25 years If all above answers are "NO", may proceed with cephalosporin use.    Decadron [Dexamethasone] Itching   Ivp Dye [Iodinated Contrast Media]        Objective:    BP 112/79   Pulse 80   Temp 98 F (36.7 C) (Temporal)   Ht '5\' 6"'$  (1.676 m)   Wt 171 lb 3.2 oz (77.7 kg)   SpO2 98%   BMI 27.63 kg/m  Wt Readings from Last 3 Encounters:  08/13/22 171 lb 3.2 oz (77.7 kg)  07/26/22 168 lb 6.4 oz (76.4 kg)  07/02/22 166 lb 3.2 oz (75.4 kg)    Physical Exam Vitals and nursing note reviewed.  Constitutional:      Appearance: He is well-developed.  HENT:     Head: Normocephalic and atraumatic.  Cardiovascular:     Rate and Rhythm: Normal rate and regular rhythm.     Heart sounds: Normal heart sounds. No murmur heard.    No friction rub. No gallop.  Pulmonary:     Effort: Pulmonary effort is normal. No tachypnea or respiratory distress.     Breath sounds: Normal breath sounds. No decreased breath sounds, wheezing, rhonchi or rales.  Chest:     Chest wall: No tenderness.  Abdominal:     General: Bowel sounds are normal.     Palpations: Abdomen is soft.  Musculoskeletal:        General: Normal range of motion.  Cervical back: Normal range of motion.  Skin:    General: Skin is warm and dry.   Neurological:     Mental Status: He is alert and oriented to person, place, and time.     Coordination: Coordination normal.  Psychiatric:        Behavior: Behavior normal. Behavior is cooperative.        Thought Content: Thought content normal.        Judgment: Judgment normal.          Patient has been counseled extensively about nutrition and exercise as well as the importance of adherence with medications and regular follow-up. The patient was given clear instructions to go to ER or return to medical center if symptoms don't improve, worsen or new problems develop. The patient verbalized understanding.   Follow-up: Return for physical  in january or february.   Gildardo Pounds, FNP-BC [facility] and [place] [facility], [place]   08/13/2022, 12:23 PM

## 2022-08-15 ENCOUNTER — Ambulatory Visit: Payer: Medicare Other | Attending: Cardiology

## 2022-08-15 DIAGNOSIS — I2699 Other pulmonary embolism without acute cor pulmonale: Secondary | ICD-10-CM | POA: Diagnosis not present

## 2022-08-15 DIAGNOSIS — Z7901 Long term (current) use of anticoagulants: Secondary | ICD-10-CM | POA: Diagnosis not present

## 2022-08-15 LAB — POCT INR: INR: 3.1 — AB (ref 2.0–3.0)

## 2022-08-15 NOTE — Patient Instructions (Addendum)
Description   Eat a serving of greens today and continue taking 1 tablet ('5mg'$ ) daily, except 2 tablets ('10mg'$ ) on Monday, Wednesday and Friday.  Recheck INR 6 weeks.  [facility] [phone]

## 2022-08-21 ENCOUNTER — Other Ambulatory Visit (HOSPITAL_COMMUNITY): Payer: Self-pay

## 2022-08-27 ENCOUNTER — Other Ambulatory Visit: Payer: Self-pay | Admitting: Nurse Practitioner

## 2022-08-27 ENCOUNTER — Other Ambulatory Visit (HOSPITAL_COMMUNITY): Payer: Self-pay

## 2022-08-27 DIAGNOSIS — R058 Other specified cough: Secondary | ICD-10-CM

## 2022-08-29 ENCOUNTER — Other Ambulatory Visit (HOSPITAL_COMMUNITY): Payer: Self-pay | Admitting: Family Medicine

## 2022-08-29 DIAGNOSIS — I10 Essential (primary) hypertension: Secondary | ICD-10-CM

## 2022-08-29 DIAGNOSIS — I5022 Chronic systolic (congestive) heart failure: Secondary | ICD-10-CM

## 2022-08-29 DIAGNOSIS — I428 Other cardiomyopathies: Secondary | ICD-10-CM

## 2022-08-31 ENCOUNTER — Other Ambulatory Visit: Payer: Self-pay | Admitting: Cardiovascular Disease

## 2022-08-31 DIAGNOSIS — I2699 Other pulmonary embolism without acute cor pulmonale: Secondary | ICD-10-CM

## 2022-08-31 DIAGNOSIS — I824Y9 Acute embolism and thrombosis of unspecified deep veins of unspecified proximal lower extremity: Secondary | ICD-10-CM

## 2022-09-13 ENCOUNTER — Ambulatory Visit: Payer: Medicare Other | Attending: Nurse Practitioner

## 2022-09-13 DIAGNOSIS — Z0000 Encounter for general adult medical examination without abnormal findings: Secondary | ICD-10-CM | POA: Diagnosis not present

## 2022-09-13 MED ORDER — ZOSTER VAC RECOMB ADJUVANTED 50 MCG/0.5ML IM SUSR: 0.5000 mL | Freq: Once | INTRAMUSCULAR | 0 refills | Status: AC

## 2022-09-13 NOTE — Progress Notes (Signed)
 Subjective:   Jonathan Dickson is a 66 y.o. male who presents for an Initial Medicare Annual Wellness Visit.  Review of Systems    connected with  Jonathan Dickson on 09/13/22 at  3:44 pm by telephone and verified that I am speaking with the correct person using two identifiers. I discussed the limitations, risks, security and privacy concerns of performing an evaluation and management service by telephone and the availability of in person appointments. I also discussed with the patient that there may be a patient responsible charge related to this service. The patient expressed understanding and agreed to proceed.  Patient location:  home  My Location: community health and wellness Persons on the telephone call:   Myself( Carly Mack) and [name]        Objective:    There were no vitals filed for this visit. There is no height or weight on file to calculate BMI.     09/13/2022    3:47 PM 11/06/2021    6:29 AM 04/19/2021   10:05 AM 04/10/2021    8:30 AM 04/01/2021   10:21 AM 03/06/2021    3:19 PM 04/17/2020    2:02 PM  Advanced Directives  Does Patient Have a Medical Advance Directive? No No No No No Yes No  Type of Advance Directive      Healthcare Power of Attorney;Living will   Copy of Healthcare Power of Attorney in Chart?      No - copy requested   Would patient like information on creating a medical advance directive? Yes (Inpatient - patient defers creating a medical advance directive at this time - Information given)  No - Patient declined No - Patient declined   No - Patient declined    Current Medications (verified) Outpatient Encounter Medications as of 09/13/2022  Medication Sig   acetaminophen (TYLENOL) 500 MG tablet Take 1,000 mg by mouth every 6 (six) hours as needed for moderate pain or headache.   albuterol (VENTOLIN HFA) 108 (90 Base) MCG/ACT inhaler Inhale 2 puffs into the lungs every 6 (six) hours as needed for wheezing or shortness of breath.   Aromatic Inhalants (VICKS  VAPOINHALER) INHA Inhale 1 Dose into the lungs daily as needed (congestion).   atorvastatin (LIPITOR) 80 MG tablet Take 1 tablet (80 mg total) by mouth daily.   carvedilol (COREG) 12.5 MG tablet Take 1 tablet (12.5 mg total) by mouth 2 (two) times daily with a meal. NEEDS FOLLOW UP APPOINTMENT FOR ANYMORE REFILLS   Chlorphen-Phenyleph-ASA (ALKA-SELTZER PLUS COLD PO) Take 1-2 tablets by mouth 2 (two) times daily as needed (cold symptoms).    Cholecalciferol (VITAMIN D) 50 MCG (2000 UT) tablet Take 2,000 Units by mouth daily.   cyclobenzaprine (FLEXERIL) 5 MG tablet Take 1 tablet (5 mg total) by mouth 3 (three) times daily as needed for muscle spasms.   ENTRESTO 49-51 MG TAKE 1 TABLET BY MOUTH TWICE DAILY   FARXIGA 10 MG TABS tablet TAKE 1 TABLET(10 MG) BY MOUTH DAILY. NEED FOLLOW UP APPOINTMENT FOR ANYMORE REFILLS   fluticasone (FLONASE) 50 MCG/ACT nasal spray SHAKE LIQUID AND USE 1 SPRAY IN EACH NOSTRIL DAILY   loratadine (CLARITIN) 10 MG tablet Take 1 tablet (10 mg total) by mouth daily.   methocarbamol (ROBAXIN) 500 MG tablet Take 1 tablet (500 mg total) by mouth every 6 (six) hours as needed for muscle spasms.   spironolactone (ALDACTONE) 25 MG tablet TAKE 1/2 TABLET(12.5 MG) BY MOUTH DAILY (Patient taking differently: Take 12.5 mg by mouth   daily.)   warfarin (COUMADIN) 5 MG tablet TAKE 2 TABLETS BY MOUTH DAILY AS DIRECTED BY COUMADIN CLINIC   diclofenac Sodium (VOLTAREN) 1 % GEL Apply 2 g topically 4 (four) times daily as needed (pain). (Patient not taking: Reported on 09/13/2022)   metFORMIN (GLUCOPHAGE) 500 MG tablet Take 1 tablet (500 mg total) by mouth daily with breakfast.   Misc. Devices MISC by Does not apply route. Flutter valve daily   Tafamidis 61 MG CAPS Take 1 capsule by mouth daily.   No facility-administered encounter medications on file as of 09/13/2022.    Allergies (verified) Peanut-containing drug products, Penicillins, Decadron [dexamethasone], and Ivp dye [iodinated  contrast media]   History: Past Medical History:  Diagnosis Date   Abnormal TSH 02/2016   AICD (automatic cardioverter/defibrillator) present    Anxiety    Blood clotting disorder (HCC)    Bronchiectasis (HCC)    left lower lung   Cardiac amyloidosis (HCC)    Cervical disc disease    Chronic combined systolic and diastolic CHF (congestive heart failure) ([place])    a. 01/2016 Echo: EF 25%, inf AK, diffuse sev HK, Gr1 DD, mild MR.   Coronary atherosclerosis    Depression    Diabetes mellitus without complication ([place])    Dyspnea    Dysrhythmia    Full thickness rotator cuff tear 06/2021   MRI   History of DVT (deep vein thrombosis) 08/04/2019   venous doppler US   History of hiatal hernia    Hx of adenomatous polyp of colon    Hyperglycemia    Hyperlipidemia    Hypertension    Hypertensive heart disease    Myocardial infarction [facility])    ??  maybe   NICM (nonischemic cardiomyopathy) ([place])    a. 01/2016 Echo: EF 25%, inf AK, diffuse sev HK, Gr1 DD, mild MR; b. 01/2016 MV: EF 22%, no isch/infarct;  c. 02/2016 Cath: Nl cors, EF 35-45%.   Osteoarthritis of hips, bilateral    Paraseptal emphysema (HCC)    mild   Peanut allergy    Pneumonia    Pre-diabetes    Presence of implantable cardioverter-defibrillator (ICD)    S/P repair of ventral hernia    Seizures (HCC)    with penicillin   Sensorineural hearing loss    Past Surgical History:  Procedure Laterality Date   ANTERIOR CERVICAL DECOMP/DISCECTOMY FUSION N/A 07/10/2016   Procedure: ANTERIOR CERVICAL DECOMPRESSION FUSION CERVICAL 4-5, CERVICAL 5-6, CERVICAL 6-7 WITH INSTRUMENTATION AND ALLOGRAFT;  Surgeon: [name], MD;  Location: [facility];  Service: Orthopedics;  Laterality: N/A;   ANTERIOR CERVICAL DECOMP/DISCECTOMY FUSION N/A 04/19/2021   Procedure: ANTERIOR CERVICAL DECOMPRESSION FUSION CERVICAL 3- CERVICAL 4 WITH INSTRUMENTATION AND ALLOGRAFT;  Surgeon: [name], MD;  Location: [facility];  Service: Orthopedics;   Laterality: N/A;   CARDIAC CATHETERIZATION N/A 03/11/2016   Procedure: Left Heart Cath and Coronary Angiography;  Surgeon: [name], MD;  Location: [facility];  Service: Cardiovascular;  Laterality: N/A;   COLONOSCOPY     ICD IMPLANT N/A 06/17/2018   Procedure: ICD IMPLANT;  Surgeon: [name], MD;  Location: [facility];  Service: Cardiovascular;  Laterality: N/A;   Thumb surgery Right    VENTRAL HERNIA REPAIR N/A 10/25/2019   Procedure: PRIMARY VENTRAL HERNIA REPAIR;  Surgeon: [name], MD;  Location: [facility];  Service: General;  Laterality: N/A;   Family History  Problem Relation Age of Onset   Diabetes Mother  Diabetes Sister    Clotting disorder Neg Hx    Lung disease Neg Hx    Colon cancer Neg Hx    Esophageal cancer Neg Hx    Stomach cancer Neg Hx    Pancreatic cancer Neg Hx    Social History   Socioeconomic History   Marital status: Legally Separated    Spouse name: Not on file   Number of children: 3   Years of education: Not on file   Highest education level: Not on file  Occupational History   Not on file  Tobacco Use   Smoking status: Former    Packs/day: 1.50    Years: 18.00    Total pack years: 27.00    Types: Cigarettes    Quit date: 03/15/2003    Years since quitting: 19.5   Smokeless tobacco: Never  Vaping Use   Vaping Use: Never used  Substance and Sexual Activity   Alcohol use: No    Comment: none since 2004   Drug use: No    Comment: former  none since 2004   Sexual activity: Not Currently  Other Topics Concern   Not on file  Social History Narrative   Not on file   Social Determinants of Health   Financial Resource Strain: Low Risk  (09/13/2022)   Overall Financial Resource Strain (CARDIA)    Difficulty of Paying Living Expenses: Not hard at all  Food Insecurity: No Food Insecurity (09/13/2022)   Hunger Vital Sign    Worried About Running Out of Food in the Last Year: Never true    Ran Out of  Food in the Last Year: Never true  Transportation Needs: No Transportation Needs (09/13/2022)   PRAPARE - Transportation    Lack of Transportation (Medical): No    Lack of Transportation (Non-Medical): No  Physical Activity: Insufficiently Active (09/13/2022)   Exercise Vital Sign    Days of Exercise per Week: 1 day    Minutes of Exercise per Session: 30 min  Stress: Not on file  Social Connections: Unknown (09/13/2022)   Social Connection and Isolation Panel [NHANES]    Frequency of Communication with Friends and Family: Three times a week    Frequency of Social Gatherings with Friends and Family: Three times a week    Attends Religious Services: Not on file    Active Member of Clubs or Organizations: Not on file    Attends Club or Organization Meetings: Not on file    Marital Status: Widowed    Tobacco Counseling Counseling given: Not Answered   Clinical Intake:     Pain : No/denies pain     Diabetes: No     Diabetic?no          Activities of Daily Living    09/13/2022    3:14 PM  In your present state of health, do you have any difficulty performing the following activities:  Hearing? 0  Vision? 0  Difficulty concentrating or making decisions? 0  Walking or climbing stairs? 0  Dressing or bathing? 0  Doing errands, shopping? 0  Preparing Food and eating ? N  Using the Toilet? N  In the past six months, have you accidently leaked urine? N  Do you have problems with loss of bowel control? N  Managing your Medications? N  Managing your Finances? N  Housekeeping or managing your Housekeeping? N    Patient Care Team: Fleming, Zelda W, NP as PCP - General (Nurse Practitioner) Berry, Jonathan J, MD as   PCP - Cardiology (Cardiology) Camnitz, Will Martin, MD as PCP - Electrophysiology (Cardiology) Bensimhon, Daniel R, MD as PCP - Advanced Heart Failure (Cardiology) Kale, Gautam Kishore, MD as Consulting Physician (Hematology)  Indicate any recent Medical  Services you may have received from other than [facility] providers in the past year (date may be approximate).     Assessment:   This is a routine wellness examination for Babe.  Hearing/Vision screen No results found.  Dietary issues and exercise activities discussed:     Goals Addressed   None   Depression Screen    08/13/2022   10:23 AM 06/27/2021    8:44 AM 03/06/2021    3:19 PM 01/16/2021    3:01 PM 10/30/2020    8:52 AM 11/04/2019    8:49 AM 05/13/2019    8:43 AM  PHQ 2/9 Scores  PHQ - 2 Score 1 0 0 0 0 0 0  PHQ- 9 Score 1  0 0       Fall Risk    08/13/2022    9:56 AM 06/27/2021    8:44 AM 03/06/2021    3:19 PM 01/16/2021    2:52 PM 12/11/2020    8:55 AM  Fall Risk   Falls in the past year? 0 0 0 0 0  Number falls in past yr: 0 0 0 0 0  Injury with Fall? 0 0 0 0 0  Risk for fall due to :  No Fall Risks No Fall Risks    Follow up Falls evaluation completed        FALL RISK PREVENTION PERTAINING TO THE HOME:  Any stairs in or around the home? Yes  If so, are there any without handrails? No  Home free of loose throw rugs in walkways, pet beds, electrical cords, etc? No  Adequate lighting in your home to reduce risk of falls? No   ASSISTIVE DEVICES UTILIZED TO PREVENT FALLS:  Life alert? No  Use of a cane, walker or w/c? No  Grab bars in the bathroom? Yes  Shower chair or bench in shower? No  Elevated toilet seat or a handicapped toilet? No   TIMED UP AND GO:  Was the test performed? NO .  Length of time to ambulate 10 feet:  sec.   Gait slow and steady without use of assistive device  Cognitive Function:    09/13/2022    3:48 PM 03/06/2021    3:20 PM  MMSE - Mini Mental State Exam  Orientation to time 5 5  Orientation to Place 5 5  Registration 3 3  Attention/ Calculation 5 5  Recall 3 3  Language- name 2 objects 2 2  Language- repeat 1 1  Language- follow 3 step command 3 3  Language- read & follow direction 1 1  Write a sentence 1 1  Copy design 1  0  Total score 30 29        09/13/2022    3:49 PM  6CIT Screen  What Year? 0 points  What month? 0 points  Count back from 20 0 points  Months in reverse 0 points  Repeat phrase 0 points    Immunizations Immunization History  Administered Date(s) Administered   Influenza,inj,Quad PF,6+ Mos 05/21/2016, 06/21/2019, 10/30/2020, 06/27/2021   Influenza-Unspecified 05/31/2022   Moderna Sars-Covid-2 Vaccination 11/25/2019, 12/23/2019, 07/12/2020   Pneumococcal Conjugate-13 01/16/2021   Pneumococcal Polysaccharide-23 05/21/2016   Tdap 11/26/2016, 02/15/2019   Zoster Recombinat (Shingrix) 03/06/2021    TDAP status: Up to date    Flu Vaccine status: Up to date  Pneumococcal vaccine status: Up to date  Covid-19 vaccine status: Information provided on how to obtain vaccines.   Qualifies for Shingles Vaccine? Yes   Zostavax completed No   Shingrix Completed?: No.    Education has been provided regarding the importance of this vaccine. Patient has been advised to call insurance company to determine out of pocket expense if they have not yet received this vaccine. Advised may also receive vaccine at local pharmacy or Health Dept. Verbalized acceptance and understanding.  Screening Tests Health Maintenance  Topic Date Due   Zoster Vaccines- Shingrix (2 of 2) 05/01/2021   COLONOSCOPY (Pts 45-71yr Insurance coverage will need to be confirmed)  05/09/2021   Diabetic kidney evaluation - Urine ACR  10/30/2021   COVID-19 Vaccine (4 - 2023-24 season) 05/31/2022   OPHTHALMOLOGY EXAM  06/18/2022   FOOT EXAM  01/16/2023   HEMOGLOBIN A1C  02/11/2023   Diabetic kidney evaluation - eGFR measurement  04/09/2023   Medicare Annual Wellness (AWV)  09/14/2023   Pneumonia Vaccine 67 Years old (3 - PPSV23 or PCV20) 01/16/2026   DTaP/Tdap/Td (3 - Td or Tdap) 02/14/2029   INFLUENZA VACCINE  Completed   Hepatitis C Screening  Completed   HPV VACCINES  Aged Out    Health Maintenance  Health  Maintenance Due  Topic Date Due   Zoster Vaccines- Shingrix (2 of 2) 05/01/2021   COLONOSCOPY (Pts 45-454yrInsurance coverage will need to be confirmed)  05/09/2021   Diabetic kidney evaluation - Urine ACR  10/30/2021   COVID-19 Vaccine (4 - 2023-24 season) 05/31/2022   OPHTHALMOLOGY EXAM  06/18/2022    Colorectal cancer screening:  Patient has GI doctor, stated when he called to schedule his colonoscopy they said he is not due one until next year. Lung Cancer Screening: (Low Dose CT Chest recommended if Age 423-80ears, 30 pack-year currently smoking OR have quit w/in 15years.) does not qualify.   Lung Cancer Screening Referral:   Additional Screening:  Hepatitis C Screening: does qualify; Completed 05/21/16  Vision Screening: Recommended annual ophthalmology exams for early detection of glaucoma and other disorders of the eye. Is the patient up to date with their annual eye exam?  Yes  Who is the provider or what is the name of the office in which the patient attends annual eye exams? Dr.Gorat If pt is not established with a provider, would they like to be referred to a provider to establish care?    .   Dental Screening: Recommended annual dental exams for proper oral hygiene  Community Resource Referral / Chronic Care Management: CRR required this visit?  No   CCM required this visit?  No      Plan:     I have personally reviewed and noted the following in the patient's chart:   Medical and social history Use of alcohol, tobacco or illicit drugs  Current medications and supplements including opioid prescriptions. Patient is currently taking opioid prescriptions. Information provided to patient regarding non-opioid alternatives. Patient advised to discuss non-opioid treatment plan with their provider. Functional ability and status Nutritional status Physical activity Advanced directives List of other physicians Hospitalizations, surgeries, and ER visits in previous 12  months Vitals Screenings to include cognitive, depression, and falls Referrals and appointments  In addition, I have reviewed and discussed with patient certain preventive protocols, quality metrics, and best practice recommendations. A written personalized care plan for preventive services as well as general preventive health recommendations were  provided to patient.     Lillie Columbia, [place]   09/13/2022   Nurse Notes:

## 2022-09-14 ENCOUNTER — Other Ambulatory Visit: Payer: Self-pay | Admitting: Internal Medicine

## 2022-09-17 ENCOUNTER — Other Ambulatory Visit (HOSPITAL_COMMUNITY): Payer: Self-pay | Admitting: Internal Medicine

## 2022-09-17 ENCOUNTER — Other Ambulatory Visit (HOSPITAL_COMMUNITY): Payer: Self-pay

## 2022-09-17 ENCOUNTER — Other Ambulatory Visit: Payer: Self-pay

## 2022-09-17 MED ORDER — VYNDAMAX 61 MG PO CAPS
1.0000 | ORAL_CAPSULE | Freq: Every day | ORAL | 0 refills | Status: DC
  Filled 2022-09-17: qty 30, 30d supply, fill #0

## 2022-09-24 ENCOUNTER — Ambulatory Visit (INDEPENDENT_AMBULATORY_CARE_PROVIDER_SITE_OTHER): Payer: Medicare Other

## 2022-09-24 ENCOUNTER — Other Ambulatory Visit (HOSPITAL_COMMUNITY): Payer: Self-pay

## 2022-09-24 DIAGNOSIS — I428 Other cardiomyopathies: Secondary | ICD-10-CM

## 2022-09-24 LAB — CUP PACEART REMOTE DEVICE CHECK
Battery Remaining Longevity: 62 mo
Battery Remaining Percentage: 61 %
Battery Voltage: 2.96 V
Brady Statistic RV Percent Paced: 1 %
Date Time Interrogation Session: 20231226020028
HighPow Impedance: 64 Ohm
HighPow Impedance: 64 Ohm
Implantable Lead Connection Status: 753985
Implantable Lead Implant Date: 20190918
Implantable Lead Location: 753860
Implantable Lead Model: 7122
Implantable Pulse Generator Implant Date: 20190918
Lead Channel Impedance Value: 400 Ohm
Lead Channel Pacing Threshold Amplitude: 0.5 V
Lead Channel Pacing Threshold Pulse Width: 0.5 ms
Lead Channel Sensing Intrinsic Amplitude: 10.8 mV
Lead Channel Setting Pacing Amplitude: 2.5 V
Lead Channel Setting Pacing Pulse Width: 0.5 ms
Lead Channel Setting Sensing Sensitivity: 0.5 mV
Pulse Gen Serial Number: 9796641

## 2022-09-25 ENCOUNTER — Telehealth: Payer: Self-pay

## 2022-09-25 NOTE — Telephone Encounter (Signed)
Scheduled remote reviewed. Normal device function.   CorVue daily impedance trending below reference. Routing for further review.   Next remote 91 days- JJB   Patient is past due to see Camnitz.  Needs appointment.  Routing to scheduling team to get on the books for [facility] next available.

## 2022-09-26 ENCOUNTER — Ambulatory Visit: Payer: Medicare Other | Attending: Cardiovascular Disease | Admitting: *Deleted

## 2022-09-26 DIAGNOSIS — I824Y9 Acute embolism and thrombosis of unspecified deep veins of unspecified proximal lower extremity: Secondary | ICD-10-CM

## 2022-09-26 DIAGNOSIS — Z7901 Long term (current) use of anticoagulants: Secondary | ICD-10-CM | POA: Diagnosis not present

## 2022-09-26 DIAGNOSIS — I2699 Other pulmonary embolism without acute cor pulmonale: Secondary | ICD-10-CM | POA: Diagnosis not present

## 2022-09-26 LAB — POCT INR: INR: 4.5 — AB (ref 2.0–3.0)

## 2022-09-26 NOTE — Patient Instructions (Signed)
Description   Do not take any warfarin tomorrow and no warfarin Saturday then start taking 1 tablet ('5mg'$ ) daily, except 2 tablets ('10mg'$ ) on Monday and Friday.  Recheck INR 3 weeks. [facility]in Clinic [phone]

## 2022-09-27 ENCOUNTER — Other Ambulatory Visit (HOSPITAL_COMMUNITY): Payer: Self-pay

## 2022-09-29 ENCOUNTER — Other Ambulatory Visit: Payer: Self-pay | Admitting: Internal Medicine

## 2022-10-01 NOTE — Progress Notes (Signed)
I have reviewed and agree with the AWV documentation Attestation signed by Geryl Rankins FNP-BC on  10-01-2022

## 2022-10-07 ENCOUNTER — Encounter: Payer: Self-pay | Admitting: Nurse Practitioner

## 2022-10-07 ENCOUNTER — Ambulatory Visit: Payer: 59 | Attending: Nurse Practitioner | Admitting: Nurse Practitioner

## 2022-10-07 VITALS — BP 117/72 | HR 75 | Ht 66.0 in | Wt 163.0 lb

## 2022-10-07 DIAGNOSIS — D649 Anemia, unspecified: Secondary | ICD-10-CM

## 2022-10-07 DIAGNOSIS — E119 Type 2 diabetes mellitus without complications: Secondary | ICD-10-CM

## 2022-10-07 DIAGNOSIS — Z1211 Encounter for screening for malignant neoplasm of colon: Secondary | ICD-10-CM

## 2022-10-07 DIAGNOSIS — Z125 Encounter for screening for malignant neoplasm of prostate: Secondary | ICD-10-CM

## 2022-10-07 DIAGNOSIS — E1169 Type 2 diabetes mellitus with other specified complication: Secondary | ICD-10-CM | POA: Diagnosis not present

## 2022-10-07 DIAGNOSIS — Z23 Encounter for immunization: Secondary | ICD-10-CM

## 2022-10-07 DIAGNOSIS — E785 Hyperlipidemia, unspecified: Secondary | ICD-10-CM

## 2022-10-07 DIAGNOSIS — Z0000 Encounter for general adult medical examination without abnormal findings: Secondary | ICD-10-CM | POA: Diagnosis not present

## 2022-10-07 NOTE — Progress Notes (Signed)
Assessment & Plan:  Jonathan Dickson was seen today for annual exam.  Diagnoses and all orders for this visit:  Encounter for annual physical exam -     CMP14+EGFR -     CBC with Differential -     Lipid panel  Type 2 diabetes mellitus without complication, without long-term current use of insulin ([place] [employer] [place]) -     CMP14+EGFR  Hyperlipidemia associated with type 2 diabetes mellitus (HCC) -     Lipid panel  Anemia, unspecified type -     CBC with Differential  Need for shingles vaccine -     Varicella-zoster vaccine IM  Colon cancer screening -     Ambulatory referral to Gastroenterology  Prostate cancer screening -     PSA    Patient has been counseled on age-appropriate routine health concerns for screening and prevention. These are reviewed and up-to-date. Referrals have been placed accordingly. Immunizations are up-to-date or declined.    Subjective:   Chief Complaint  Patient presents with   Annual Exam   HPI Jonathan Dickson 67 y.o. male presents to office today for annual physical exam. Doing well today with no complaints or concerns.     Review of Systems  Constitutional:  Negative for fever, malaise/fatigue and weight loss.  HENT: Negative.  Negative for nosebleeds.   Eyes: Negative.  Negative for blurred vision, double vision and photophobia.  Respiratory: Negative.  Negative for cough and shortness of breath.   Cardiovascular: Negative.  Negative for chest pain, palpitations and leg swelling.  Gastrointestinal: Negative.  Negative for heartburn, nausea and vomiting.  Genitourinary: Negative.   Musculoskeletal: Negative.  Negative for myalgias.  Skin: Negative.   Neurological: Negative.  Negative for dizziness, focal weakness, seizures and headaches.  Endo/Heme/Allergies: Negative.   Psychiatric/Behavioral: Negative.  Negative for suicidal ideas.     Past Medical History:  Diagnosis Date   Abnormal TSH 02/2016   AICD (automatic cardioverter/defibrillator) present     Anxiety    Blood clotting disorder (HCC)    Bronchiectasis (HCC)    left lower lung   Cardiac amyloidosis (HCC)    Cervical disc disease    Chronic combined systolic and diastolic CHF (congestive heart failure) ([place])    a. 01/2016 Echo: EF 25%, inf AK, diffuse sev HK, Gr1 DD, mild MR.   Coronary atherosclerosis    Depression    Diabetes mellitus without complication ([place])    Dyspnea    Dysrhythmia    Full thickness rotator cuff tear 06/2021   MRI   History of DVT (deep vein thrombosis) 08/04/2019   venous doppler US   History of hiatal hernia    Hx of adenomatous polyp of colon    Hyperglycemia    Hyperlipidemia    Hypertension    Hypertensive heart disease    Myocardial infarction [facility])    ??  maybe   NICM (nonischemic cardiomyopathy) ([place])    a. 01/2016 Echo: EF 25%, inf AK, diffuse sev HK, Gr1 DD, mild MR; b. 01/2016 MV: EF 22%, no isch/infarct;  c. 02/2016 Cath: Nl cors, EF 35-45%.   Osteoarthritis of hips, bilateral    Paraseptal emphysema (HCC)    mild   Peanut allergy    Pneumonia    Pre-diabetes    Presence of implantable cardioverter-defibrillator (ICD)    S/P repair of ventral hernia    Seizures ([place])    with penicillin   Sensorineural hearing loss     Past Surgical History:  Procedure Laterality  Date   ANTERIOR CERVICAL DECOMP/DISCECTOMY FUSION N/A 07/10/2016   Procedure: ANTERIOR CERVICAL DECOMPRESSION FUSION CERVICAL 4-5, CERVICAL 5-6, CERVICAL 6-7 WITH INSTRUMENTATION AND ALLOGRAFT;  Surgeon: [name], MD;  Location: [place];  Service: Orthopedics;  Laterality: N/A;   ANTERIOR CERVICAL DECOMP/DISCECTOMY FUSION N/A 04/19/2021   Procedure: ANTERIOR CERVICAL DECOMPRESSION FUSION CERVICAL 3- CERVICAL 4 WITH INSTRUMENTATION AND ALLOGRAFT;  Surgeon: [name], MD;  Location: [place];  Service: Orthopedics;  Laterality: N/A;   CARDIAC CATHETERIZATION N/A 03/11/2016   Procedure: Left Heart Cath and Coronary Angiography;  Surgeon: [name], MD;   Location: [place] CV [facility];  Service: Cardiovascular;  Laterality: N/A;   COLONOSCOPY     ICD IMPLANT N/A 06/17/2018   Procedure: ICD IMPLANT;  Surgeon: [name], MD;  Location: [place] [facility];  Service: Cardiovascular;  Laterality: N/A;   Thumb surgery Right    VENTRAL HERNIA REPAIR N/A 10/25/2019   Procedure: PRIMARY VENTRAL HERNIA REPAIR;  Surgeon: [name], MD;  [facility];  Service: General;  Laterality: N/A;    Family History  Problem Relation Age of Onset   Diabetes Mother    Diabetes Sister    Clotting disorder Neg Hx    Lung disease Neg Hx    Colon cancer Neg Hx    Esophageal cancer Neg Hx    Stomach cancer Neg Hx    Pancreatic cancer Neg Hx     Social History Reviewed with no changes to be made today.   Outpatient Medications Prior to Visit  Medication Sig Dispense Refill   acetaminophen (TYLENOL) 500 MG tablet Take 1,000 mg by mouth every 6 (six) hours as needed for moderate pain or headache.     albuterol (VENTOLIN HFA) 108 (90 Base) MCG/ACT inhaler Inhale 2 puffs into the lungs every 6 (six) hours as needed for wheezing or shortness of breath. 18 g 3   Aromatic Inhalants (VICKS VAPOINHALER) INHA Inhale 1 Dose into the lungs daily as needed (congestion).     atorvastatin (LIPITOR) 80 MG tablet Take 1 tablet (80 mg total) by mouth daily. 90 tablet 3   carvedilol (COREG) 12.5 MG tablet Take 1 tablet (12.5 mg total) by mouth 2 (two) times daily with a meal. NEEDS FOLLOW UP APPOINTMENT FOR ANYMORE REFILLS 90 tablet 0   Chlorphen-Phenyleph-ASA (ALKA-SELTZER PLUS COLD PO) Take 1-2 tablets by mouth 2 (two) times daily as needed (cold symptoms).      Cholecalciferol (VITAMIN D) 50 MCG (2000 UT) tablet Take 2,000 Units by mouth daily.     cyclobenzaprine (FLEXERIL) 5 MG tablet Take 1 tablet (5 mg total) by mouth 3 (three) times daily as needed for muscle spasms. 30 tablet 1   diclofenac Sodium (VOLTAREN) 1 % GEL Apply 2 g topically 4 (four) times  daily as needed (pain).     ENTRESTO 49-51 MG TAKE 1 TABLET BY MOUTH TWICE DAILY 60 tablet 11   FARXIGA 10 MG TABS tablet TAKE 1 TABLET(10 MG) BY MOUTH DAILY. NEED FOLLOW UP APPOINTMENT FOR ANYMORE REFILLS 15 tablet 0   fluticasone (FLONASE) 50 MCG/ACT nasal spray SHAKE LIQUID AND USE 1 SPRAY IN EACH NOSTRIL DAILY 16 g 1   loratadine (CLARITIN) 10 MG tablet Take 1 tablet (10 mg total) by mouth daily. 30 tablet 5   metFORMIN (GLUCOPHAGE) 500 MG tablet Take 1 tablet (500 mg total) by mouth daily with breakfast. 90 tablet 1   methocarbamol (ROBAXIN) 500 MG tablet Take 1 tablet (500 mg total)  by mouth every 6 (six) hours as needed for muscle spasms. 60 tablet 0   Misc. Devices MISC by Does not apply route. Flutter valve daily     spironolactone (ALDACTONE) 25 MG tablet TAKE 1/2 TABLET(12.5 MG) BY MOUTH DAILY (Patient taking differently: Take 12.5 mg by mouth daily.) 15 tablet 11   Tafamidis (VYNDAMAX) 61 MG CAPS Take 1 capsule by mouth daily. 30 capsule 0   warfarin (COUMADIN) 5 MG tablet TAKE 2 TABLETS BY MOUTH DAILY AS DIRECTED BY COUMADIN CLINIC 150 tablet 0   No facility-administered medications prior to visit.    Allergies  Allergen Reactions   Peanut-Containing Drug Products Swelling   Penicillins Other (See Comments)    CONVULSIONS Did it involve swelling of the face/tongue/throat, SOB, or low BP? No Did it involve sudden or severe rash/hives, skin peeling, or any reaction on the inside of your mouth or nose? No Did you need to seek medical attention at a hospital or doctor's office? Yes When did it last happen?      25 years If all above answers are "NO", may proceed with cephalosporin use.    Decadron [Dexamethasone] Itching   Ivp Dye [Iodinated Contrast Media]        Objective:    BP 117/72   Pulse 75   Ht '5\' 6"'$  (1.676 m)   Wt 163 lb (73.9 kg)   SpO2 98%   BMI 26.31 kg/m  Wt Readings from Last 3 Encounters:  10/07/22 163 lb (73.9 kg)  08/13/22 171 lb 3.2 oz (77.7 kg)   07/26/22 168 lb 6.4 oz (76.4 kg)    Physical Exam Constitutional:      Appearance: He is well-developed.  HENT:     Head: Normocephalic and atraumatic.     Right Ear: Hearing, tympanic membrane, ear canal and external ear normal.     Left Ear: Hearing, tympanic membrane, ear canal and external ear normal.     Nose: Nose normal. No mucosal edema or rhinorrhea.     Right Turbinates: Not enlarged.     Left Turbinates: Not enlarged.     Mouth/Throat:     Lips: Pink.     Mouth: Mucous membranes are moist.     Dentition: No gingival swelling, dental abscesses or gum lesions.     Pharynx: Uvula midline.     Tonsils: No tonsillar exudate. 1+ on the right. 1+ on the left.  Eyes:     General: Lids are normal. No scleral icterus.    Extraocular Movements: Extraocular movements intact.     Conjunctiva/sclera: Conjunctivae normal.     Pupils: Pupils are equal, round, and reactive to light.  Neck:     Thyroid: No thyromegaly.     Trachea: No tracheal deviation.  Cardiovascular:     Rate and Rhythm: Normal rate and regular rhythm.     Heart sounds: Normal heart sounds. No murmur heard.    No friction rub. No gallop.  Pulmonary:     Effort: Pulmonary effort is normal. No respiratory distress.     Breath sounds: Normal breath sounds. No wheezing or rales.  Chest:     Chest wall: No mass or tenderness.  Breasts:    Right: No inverted nipple, mass, nipple discharge, skin change or tenderness.     Left: No inverted nipple, mass, nipple discharge, skin change or tenderness.  Abdominal:     General: Bowel sounds are normal. There is no distension.     Palpations: Abdomen is soft.  There is no mass.     Tenderness: There is no abdominal tenderness. There is no guarding or rebound.     Hernia: A hernia is present. Hernia is present in the ventral area.  Musculoskeletal:        General: No tenderness or deformity. Normal range of motion.     Cervical back: Normal range of motion and neck  supple.  Lymphadenopathy:     Cervical: No cervical adenopathy.  Skin:    General: Skin is warm and dry.     Capillary Refill: Capillary refill takes less than 2 seconds.     Findings: No erythema.  Neurological:     Mental Status: He is alert and oriented to person, place, and time.     Cranial Nerves: No cranial nerve deficit.     Sensory: Sensation is intact.     Motor: No abnormal muscle tone.     Coordination: Coordination is intact. Coordination normal.     Gait: Gait is intact.     Deep Tendon Reflexes: Reflexes normal.     Reflex Scores:      Patellar reflexes are 1+ on the right side and 1+ on the left side. Psychiatric:        Attention and Perception: Attention normal.        Mood and Affect: Mood normal.        Speech: Speech normal.        Behavior: Behavior normal.        Thought Content: Thought content normal.        Judgment: Judgment normal.          Patient has been counseled extensively about nutrition and exercise as well as the importance of adherence with medications and regular follow-up. The patient was given clear instructions to go to ER or return to medical center if symptoms don't improve, worsen or new problems develop. The patient verbalized understanding.   Follow-up: Return in about 4 months (around 02/19/2023) for [place], FNP-BC [facility] and [place], Leon   10/07/2022, 10:41 AM

## 2022-10-08 LAB — CBC WITH DIFFERENTIAL/PLATELET
Basophils Absolute: 0.1 10*3/uL (ref 0.0–0.2)
Basos: 1 %
EOS (ABSOLUTE): 0.2 10*3/uL (ref 0.0–0.4)
Eos: 3 %
Hematocrit: 42.6 % (ref 37.5–51.0)
Hemoglobin: 12.9 g/dL — ABNORMAL LOW (ref 13.0–17.7)
Immature Grans (Abs): 0 10*3/uL (ref 0.0–0.1)
Immature Granulocytes: 0 %
Lymphocytes Absolute: 2 10*3/uL (ref 0.7–3.1)
Lymphs: 39 %
MCH: 23.3 pg — ABNORMAL LOW (ref 26.6–33.0)
MCHC: 30.3 g/dL — ABNORMAL LOW (ref 31.5–35.7)
MCV: 77 fL — ABNORMAL LOW (ref 79–97)
Monocytes Absolute: 0.4 10*3/uL (ref 0.1–0.9)
Monocytes: 8 %
Neutrophils Absolute: 2.6 10*3/uL (ref 1.4–7.0)
Neutrophils: 49 %
Platelets: 219 10*3/uL (ref 150–450)
RBC: 5.54 x10E6/uL (ref 4.14–5.80)
RDW: 18.7 % — ABNORMAL HIGH (ref 11.6–15.4)
WBC: 5.3 10*3/uL (ref 3.4–10.8)

## 2022-10-08 LAB — CMP14+EGFR
ALT: 27 IU/L (ref 0–44)
AST: 30 IU/L (ref 0–40)
Albumin/Globulin Ratio: 1.6 (ref 1.2–2.2)
Albumin: 4.3 g/dL (ref 3.9–4.9)
Alkaline Phosphatase: 78 IU/L (ref 44–121)
BUN/Creatinine Ratio: 18 (ref 10–24)
BUN: 19 mg/dL (ref 8–27)
Bilirubin Total: 0.3 mg/dL (ref 0.0–1.2)
CO2: 14 mmol/L — ABNORMAL LOW (ref 20–29)
Calcium: 9.9 mg/dL (ref 8.6–10.2)
Chloride: 107 mmol/L — ABNORMAL HIGH (ref 96–106)
Creatinine, Ser: 1.08 mg/dL (ref 0.76–1.27)
Globulin, Total: 2.7 g/dL (ref 1.5–4.5)
Glucose: 96 mg/dL (ref 70–99)
Potassium: 4.5 mmol/L (ref 3.5–5.2)
Sodium: 138 mmol/L (ref 134–144)
Total Protein: 7 g/dL (ref 6.0–8.5)
eGFR: 75 mL/min/{1.73_m2} (ref >59)

## 2022-10-08 LAB — LIPID PANEL
Chol/HDL Ratio: 3.5 ratio (ref 0.0–5.0)
Cholesterol, Total: 152 mg/dL (ref 100–199)
HDL: 44 mg/dL (ref >39)
LDL Chol Calc (NIH): 78 mg/dL (ref 0–99)
Triglycerides: 175 mg/dL — ABNORMAL HIGH (ref 0–149)
VLDL Cholesterol Cal: 30 mg/dL (ref 5–40)

## 2022-10-08 LAB — PSA: Prostate Specific Ag, Serum: 1.3 ng/mL (ref 0.0–4.0)

## 2022-10-10 ENCOUNTER — Other Ambulatory Visit (HOSPITAL_COMMUNITY): Payer: Self-pay | Admitting: Family Medicine

## 2022-10-10 DIAGNOSIS — I428 Other cardiomyopathies: Secondary | ICD-10-CM

## 2022-10-10 DIAGNOSIS — I10 Essential (primary) hypertension: Secondary | ICD-10-CM

## 2022-10-10 DIAGNOSIS — I5022 Chronic systolic (congestive) heart failure: Secondary | ICD-10-CM

## 2022-10-14 ENCOUNTER — Other Ambulatory Visit: Payer: Self-pay | Admitting: Internal Medicine

## 2022-10-16 ENCOUNTER — Other Ambulatory Visit (HOSPITAL_COMMUNITY): Payer: Self-pay | Admitting: Internal Medicine

## 2022-10-16 ENCOUNTER — Other Ambulatory Visit (HOSPITAL_COMMUNITY): Payer: Self-pay

## 2022-10-16 MED ORDER — VYNDAMAX 61 MG PO CAPS
1.0000 | ORAL_CAPSULE | Freq: Every day | ORAL | 0 refills | Status: DC
  Filled 2022-10-16: qty 30, 30d supply, fill #0

## 2022-10-17 ENCOUNTER — Ambulatory Visit: Payer: 59 | Attending: Internal Medicine | Admitting: *Deleted

## 2022-10-17 DIAGNOSIS — I824Y9 Acute embolism and thrombosis of unspecified deep veins of unspecified proximal lower extremity: Secondary | ICD-10-CM

## 2022-10-17 DIAGNOSIS — Z7901 Long term (current) use of anticoagulants: Secondary | ICD-10-CM

## 2022-10-17 LAB — POCT INR: INR: 3 (ref 2.0–3.0)

## 2022-10-17 NOTE — Patient Instructions (Signed)
Description   Continue taking warfarin 1 tablet ('5mg'$ ) daily, except 2 tablets ('10mg'$ ) on Monday and Friday. Recheck INR 4 weeks. [facility] [phone]

## 2022-10-19 ENCOUNTER — Other Ambulatory Visit: Payer: Self-pay | Admitting: Internal Medicine

## 2022-10-21 ENCOUNTER — Other Ambulatory Visit: Payer: Self-pay

## 2022-10-21 NOTE — Progress Notes (Signed)
Remote ICD transmission.   

## 2022-10-23 ENCOUNTER — Other Ambulatory Visit: Payer: Self-pay

## 2022-10-24 ENCOUNTER — Other Ambulatory Visit: Payer: Self-pay | Admitting: Adult Health

## 2022-10-24 DIAGNOSIS — J479 Bronchiectasis, uncomplicated: Secondary | ICD-10-CM

## 2022-10-28 ENCOUNTER — Other Ambulatory Visit (HOSPITAL_COMMUNITY): Payer: Self-pay

## 2022-11-03 ENCOUNTER — Other Ambulatory Visit: Payer: Self-pay | Admitting: Internal Medicine

## 2022-11-04 ENCOUNTER — Other Ambulatory Visit (HOSPITAL_COMMUNITY): Payer: Self-pay | Admitting: Cardiology

## 2022-11-04 MED ORDER — DAPAGLIFLOZIN PROPANEDIOL 10 MG PO TABS: 10.0000 mg | ORAL_TABLET | Freq: Every day | ORAL | 0 refills | Status: DC

## 2022-11-06 ENCOUNTER — Other Ambulatory Visit (HOSPITAL_COMMUNITY): Payer: Self-pay | Admitting: Family Medicine

## 2022-11-06 DIAGNOSIS — I428 Other cardiomyopathies: Secondary | ICD-10-CM

## 2022-11-06 DIAGNOSIS — I10 Essential (primary) hypertension: Secondary | ICD-10-CM

## 2022-11-06 DIAGNOSIS — I5022 Chronic systolic (congestive) heart failure: Secondary | ICD-10-CM

## 2022-11-08 ENCOUNTER — Ambulatory Visit: Payer: 59 | Attending: Cardiology | Admitting: Cardiology

## 2022-11-08 ENCOUNTER — Encounter: Payer: Self-pay | Admitting: Cardiology

## 2022-11-08 VITALS — BP 120/82 | HR 89 | Ht 66.0 in | Wt 165.0 lb

## 2022-11-08 DIAGNOSIS — I5022 Chronic systolic (congestive) heart failure: Secondary | ICD-10-CM

## 2022-11-08 LAB — CUP PACEART INCLINIC DEVICE CHECK
Battery Remaining Longevity: 62 mo
Brady Statistic RV Percent Paced: 0 %
Date Time Interrogation Session: 20240209170915
HighPow Impedance: 72 Ohm
Implantable Lead Connection Status: 753985
Implantable Lead Implant Date: 20190918
Implantable Lead Location: 753860
Implantable Lead Model: 7122
Implantable Pulse Generator Implant Date: 20190918
Lead Channel Impedance Value: 462.5 Ohm
Lead Channel Pacing Threshold Amplitude: 0.5 V
Lead Channel Pacing Threshold Amplitude: 0.5 V
Lead Channel Pacing Threshold Pulse Width: 0.5 ms
Lead Channel Pacing Threshold Pulse Width: 0.5 ms
Lead Channel Sensing Intrinsic Amplitude: 12 mV
Lead Channel Setting Pacing Amplitude: 2.5 V
Lead Channel Setting Pacing Pulse Width: 0.5 ms
Lead Channel Setting Sensing Sensitivity: 0.5 mV
Pulse Gen Serial Number: 9796641

## 2022-11-08 NOTE — Progress Notes (Signed)
Electrophysiology Office Note   Date:  11/08/2022   ID:  Jonathan, Dickson 09-04-1956, MRN [id number]  PCP:  Gildardo Pounds, NP  Cardiologist:  [place] Primary Electrophysiologist:  Meilani Edmundson Meredith Leeds, MD    No chief complaint on file.    History of Present Illness: Jonathan Dickson is a 67 y.o. male who is being seen today for the evaluation of CHF at the request of [name]. Presenting today for electrophysiology evaluation.    He has a history significant for chronic systolic heart failure due to nonischemic cardiomyopathy, hypertension, diabetes, substance abuse quit heroin in 2004.  Left heart catheterization showed normal coronary arteries.  MRI showed an ejection fraction 28%.  He was diagnosed with amyloid and is now on tafamidis.  He is status post Leon ICD implanted in 06/17/2018.  Today, denies symptoms of palpitations, chest pain, shortness of breath, orthopnea, PND, lower extremity edema, claudication, dizziness, presyncope, syncope, bleeding, or neurologic sequela. The patient is tolerating medications without difficulties.      Past Medical History:  Diagnosis Date   Abnormal TSH 02/2016   AICD (automatic cardioverter/defibrillator) present    Anxiety    Blood clotting disorder (HCC)    Bronchiectasis (HCC)    left lower lung   Cardiac amyloidosis (HCC)    Cervical disc disease    Chronic combined systolic and diastolic CHF (congestive heart failure) ([place])    a. 01/2016 Echo: EF 25%, inf AK, diffuse sev HK, Gr1 DD, mild MR.   Coronary atherosclerosis    Depression    Diabetes mellitus without complication ([place])    Dyspnea    Dysrhythmia    Full thickness rotator cuff tear 06/2021   MRI   History of DVT (deep vein thrombosis) 08/04/2019   venous doppler US   History of hiatal hernia    Hx of adenomatous polyp of colon    Hyperglycemia    Hyperlipidemia    Hypertension    Hypertensive heart disease    Myocardial infarction [facility])    ??   maybe   NICM (nonischemic cardiomyopathy) ([place])    a. 01/2016 Echo: EF 25%, inf AK, diffuse sev HK, Gr1 DD, mild MR; b. 01/2016 MV: EF 22%, no isch/infarct;  c. 02/2016 Cath: Nl cors, EF 35-45%.   Osteoarthritis of hips, bilateral    Paraseptal emphysema (HCC)    mild   Peanut allergy    Pneumonia    Pre-diabetes    Presence of implantable cardioverter-defibrillator (ICD)    S/P repair of ventral hernia    Seizures (HCC)    with penicillin   Sensorineural hearing loss    Past Surgical History:  Procedure Laterality Date   ANTERIOR CERVICAL DECOMP/DISCECTOMY FUSION N/A 07/10/2016   Procedure: ANTERIOR CERVICAL DECOMPRESSION FUSION CERVICAL 4-5, CERVICAL 5-6, CERVICAL 6-7 WITH INSTRUMENTATION AND ALLOGRAFT;  Surgeon: [name], MD;  Location: [place];  Service: Orthopedics;  Laterality: N/A;   ANTERIOR CERVICAL DECOMP/DISCECTOMY FUSION N/A 04/19/2021   Procedure: ANTERIOR CERVICAL DECOMPRESSION FUSION CERVICAL 3- CERVICAL 4 WITH INSTRUMENTATION AND ALLOGRAFT;  Surgeon: [name], MD;  Location: [place];  Service: Orthopedics;  Laterality: N/A;   CARDIAC CATHETERIZATION N/A 03/11/2016   Procedure: Left Heart Cath and Coronary Angiography;  Surgeon: [name], MD;  Location: [place] CV [facility];  Service: Cardiovascular;  Laterality: N/A;   COLONOSCOPY     ICD IMPLANT N/A 06/17/2018   Procedure: ICD IMPLANT;  Surgeon: [name], MD;  Location: [place]  CV LAB;  Service: Cardiovascular;  Laterality: N/A;   Thumb surgery Right    VENTRAL HERNIA REPAIR N/A 10/25/2019   Procedure: PRIMARY VENTRAL HERNIA REPAIR;  Surgeon: [name], MD;  [facility];  Service: General;  Laterality: N/A;     Current Outpatient Medications  Medication Sig Dispense Refill   acetaminophen (TYLENOL) 500 MG tablet Take 1,000 mg by mouth every 6 (six) hours as needed for moderate pain or headache.     albuterol (VENTOLIN HFA) 108 (90 Base) MCG/ACT inhaler INHALE 2 PUFFS INTO THE  LUNGS EVERY 6 HOURS AS NEEDED FOR WHEEZING OR SHORTNESS OF BREATH 8.5 g 2   Aromatic Inhalants (VICKS VAPOINHALER) INHA Inhale 1 Dose into the lungs daily as needed (congestion).     atorvastatin (LIPITOR) 80 MG tablet Take 1 tablet (80 mg total) by mouth daily. 90 tablet 3   carvedilol (COREG) 12.5 MG tablet TAKE 1 TABLET(12.5 MG) BY MOUTH TWICE DAILY WITH A MEAL. NEED FOLLOW UP APPOINTMENT FOR ANYMORE REFILLS 60 tablet 0   Chlorphen-Phenyleph-ASA (ALKA-SELTZER PLUS COLD PO) Take 1-2 tablets by mouth 2 (two) times daily as needed (cold symptoms).      Cholecalciferol (VITAMIN D) 50 MCG (2000 UT) tablet Take 2,000 Units by mouth daily.     cyclobenzaprine (FLEXERIL) 5 MG tablet Take 1 tablet (5 mg total) by mouth 3 (three) times daily as needed for muscle spasms. 30 tablet 1   dapagliflozin propanediol (FARXIGA) 10 MG TABS tablet Take 1 tablet (10 mg total) by mouth daily. 15 tablet 0   diclofenac Sodium (VOLTAREN) 1 % GEL Apply 2 g topically 4 (four) times daily as needed (pain).     ENTRESTO 49-51 MG TAKE 1 TABLET BY MOUTH TWICE DAILY 60 tablet 11   fluticasone (FLONASE) 50 MCG/ACT nasal spray SHAKE LIQUID AND USE 1 SPRAY IN EACH NOSTRIL DAILY 16 g 1   loratadine (CLARITIN) 10 MG tablet Take 1 tablet (10 mg total) by mouth daily. 30 tablet 5   metFORMIN (GLUCOPHAGE) 500 MG tablet Take 1 tablet (500 mg total) by mouth daily with breakfast. 90 tablet 1   methocarbamol (ROBAXIN) 500 MG tablet Take 1 tablet (500 mg total) by mouth every 6 (six) hours as needed for muscle spasms. 60 tablet 0   Misc. Devices MISC by Does not apply route. Flutter valve daily     spironolactone (ALDACTONE) 25 MG tablet TAKE 1/2 TABLET(12.5 MG) BY MOUTH DAILY (Patient taking differently: Take 12.5 mg by mouth daily.) 15 tablet 11   Tafamidis (VYNDAMAX) 61 MG CAPS Take 1 capsule by mouth daily. 30 capsule 0   warfarin (COUMADIN) 5 MG tablet TAKE 2 TABLETS BY MOUTH DAILY AS DIRECTED BY COUMADIN CLINIC 150 tablet 0   No  current facility-administered medications for this visit.    Allergies:   Peanut-containing drug products, Penicillins, Decadron [dexamethasone], and Ivp dye [iodinated contrast media]   Social History:  The patient  reports that he quit smoking about 19 years ago. His smoking use included cigarettes. He has a 27.00 pack-year smoking history. He has never used smokeless tobacco. He reports that he does not drink alcohol and does not use drugs.   Family History:  The patient's family history includes Diabetes in his mother and sister.   ROS:  Please see the history of present illness.   Otherwise, review of systems is positive for none.   All other systems are reviewed and negative.   PHYSICAL EXAM: VS:  BP 120/82  Pulse 89   Ht 5' 6"$  (1.676 m)   Wt 165 lb (74.8 kg)   SpO2 99%   BMI 26.63 kg/m  , BMI Body mass index is 26.63 kg/m. GEN: Well nourished, well developed, in no acute distress  HEENT: normal  Neck: no JVD, carotid bruits, or masses Cardiac: RRR; no murmurs, rubs, or gallops,no edema  Respiratory:  clear to auscultation bilaterally, normal work of breathing GI: soft, nontender, nondistended, + BS MS: no deformity or atrophy  Skin: warm and dry, device site well healed Neuro:  Strength and sensation are intact Psych: euthymic mood, full affect  EKG:  EKG is ordered today. Personal review of the ekg ordered shows sinus rhythm  Personal review of the device interrogation today. Results in [place]: 10/07/2022: ALT 27; BUN 19; Creatinine, Ser 1.08; Hemoglobin 12.9; Platelets 219; Potassium 4.5; Sodium 138    Lipid Panel     Component Value Date/Time   CHOL 152 10/07/2022 1049   TRIG 175 (H) 10/07/2022 1049   HDL 44 10/07/2022 1049   CHOLHDL 3.5 10/07/2022 1049   CHOLHDL 2.9 10/04/2014 1508   VLDL 17 10/04/2014 1508   LDLCALC 78 10/07/2022 1049     Wt Readings from Last 3 Encounters:  11/08/22 165 lb (74.8 kg)  10/07/22 163 lb (73.9 kg)   08/13/22 171 lb 3.2 oz (77.7 kg)      Other studies Reviewed: Additional studies/ records that were reviewed today include: TTE 05/11/18  Review of the above records today demonstrates:  - Left ventricle: The cavity size was mildly dilated. Systolic   function was severely reduced. The estimated ejection fraction   was in the range of 25% to 30%. Images were inadequate for LV   wall motion assessment even with definity contrast There was an   increased relative contribution of atrial contraction to   ventricular filling. Doppler parameters are consistent with   abnormal left ventricular relaxation (grade 1 diastolic   dysfunction). - Aortic valve: Probably trileaflet; normal thickness, mildly   calcified leaflets. - Mitral valve: There was mild to moderate regurgitation. - Pulmonary arteries: Systolic pressure could not be accurately   estimated.  CMRI 04/01/18 1. Mild LVE with mild LVH septal thickness 13 mm. Diffuse hypokinesis worse in the septum apex and lateral walls EF 28%   2. Abnormal post gadolinium inversion recovery sequences with failure to null and uptake in the mid/subepicardial septum, inferior wall and lateral walls with some apical sparing   Findings consistent with amyloidosis or infiltrative cardiomyopathy  ASSESSMENT AND PLAN:  1.  Chronic systolic heart failure: Due to nonischemic cardiomyopathy.  Has been diagnosed with amyloid is on tafamidis.  On optimal medical therapy.  Is status post [place] ICD implanted 06/17/2018.  Device function appropriately.  No changes.  2.  Hypertension: well controlled  3.  Pulmonary embolism: Was previously on Eliquis.  Has been switched to warfarin.  Continue with current management.   Current medicines are reviewed at length with the patient today.   The patient does not have concerns regarding his medicines.  The following changes were made today:  none  Labs/ tests ordered today include:  Orders Placed This  Encounter  Procedures   EKG 12-Lead    Disposition:   FU 12 months  Signed, Marzella Miracle Meredith Leeds, MD  11/08/2022 5:05 PM     [place] [address] [phone] (office) [phone] (fax)

## 2022-11-13 ENCOUNTER — Other Ambulatory Visit: Payer: Self-pay | Admitting: Cardiovascular Disease

## 2022-11-13 DIAGNOSIS — I824Y9 Acute embolism and thrombosis of unspecified deep veins of unspecified proximal lower extremity: Secondary | ICD-10-CM

## 2022-11-13 DIAGNOSIS — I2699 Other pulmonary embolism without acute cor pulmonale: Secondary | ICD-10-CM

## 2022-11-13 NOTE — Telephone Encounter (Signed)
Refill request for warfarin:  Last INR was 3.0 on 10/17/22 Next INR due 11/14/22 LOV was 11/08/22  Elliot Cousin MD  Refill approved.

## 2022-11-14 ENCOUNTER — Ambulatory Visit: Payer: 59 | Attending: Internal Medicine

## 2022-11-14 DIAGNOSIS — Z7901 Long term (current) use of anticoagulants: Secondary | ICD-10-CM | POA: Diagnosis not present

## 2022-11-14 DIAGNOSIS — I824Y9 Acute embolism and thrombosis of unspecified deep veins of unspecified proximal lower extremity: Secondary | ICD-10-CM

## 2022-11-14 LAB — POCT INR: INR: 4.5 — AB (ref 2.0–3.0)

## 2022-11-14 NOTE — Patient Instructions (Signed)
Description   Hold tomorrow's dose and then START taking warfarin 1 tablet (55m) daily, except 2 tablets (135m on Friday.  Recheck INR 3 weeks.  [facility] Clinic [phone]

## 2022-11-17 ENCOUNTER — Other Ambulatory Visit: Payer: Self-pay | Admitting: Internal Medicine

## 2022-11-18 ENCOUNTER — Encounter (HOSPITAL_COMMUNITY): Payer: Self-pay

## 2022-11-18 ENCOUNTER — Ambulatory Visit (HOSPITAL_COMMUNITY)
Admission: RE | Admit: 2022-11-18 | Discharge: 2022-11-18 | Disposition: A | Discharge status: Home / Self Care | Payer: 59 | Source: Ambulatory Visit | Attending: Family Medicine | Admitting: Family Medicine

## 2022-11-18 VITALS — BP 110/82 | HR 92 | Wt 165.2 lb

## 2022-11-18 DIAGNOSIS — I10 Essential (primary) hypertension: Secondary | ICD-10-CM

## 2022-11-18 DIAGNOSIS — C9110 Chronic lymphocytic leukemia of B-cell type not having achieved remission: Secondary | ICD-10-CM | POA: Diagnosis not present

## 2022-11-18 DIAGNOSIS — Z8616 Personal history of COVID-19: Secondary | ICD-10-CM | POA: Insufficient documentation

## 2022-11-18 DIAGNOSIS — I110 Hypertensive heart disease with heart failure: Secondary | ICD-10-CM | POA: Diagnosis not present

## 2022-11-18 DIAGNOSIS — E785 Hyperlipidemia, unspecified: Secondary | ICD-10-CM

## 2022-11-18 DIAGNOSIS — I43 Cardiomyopathy in diseases classified elsewhere: Secondary | ICD-10-CM | POA: Diagnosis not present

## 2022-11-18 DIAGNOSIS — Z86711 Personal history of pulmonary embolism: Secondary | ICD-10-CM | POA: Diagnosis not present

## 2022-11-18 DIAGNOSIS — Z79899 Other long term (current) drug therapy: Secondary | ICD-10-CM | POA: Insufficient documentation

## 2022-11-18 DIAGNOSIS — R0981 Nasal congestion: Secondary | ICD-10-CM | POA: Insufficient documentation

## 2022-11-18 DIAGNOSIS — Z86718 Personal history of other venous thrombosis and embolism: Secondary | ICD-10-CM | POA: Insufficient documentation

## 2022-11-18 DIAGNOSIS — I5022 Chronic systolic (congestive) heart failure: Secondary | ICD-10-CM

## 2022-11-18 DIAGNOSIS — Z7901 Long term (current) use of anticoagulants: Secondary | ICD-10-CM | POA: Insufficient documentation

## 2022-11-18 DIAGNOSIS — I428 Other cardiomyopathies: Secondary | ICD-10-CM | POA: Diagnosis not present

## 2022-11-18 DIAGNOSIS — E1169 Type 2 diabetes mellitus with other specified complication: Secondary | ICD-10-CM

## 2022-11-18 DIAGNOSIS — E119 Type 2 diabetes mellitus without complications: Secondary | ICD-10-CM | POA: Insufficient documentation

## 2022-11-18 MED ORDER — SPIRONOLACTONE 25 MG PO TABS: 12.5000 mg | ORAL_TABLET | Freq: Every day | ORAL | 11 refills | Status: DC

## 2022-11-18 MED ORDER — DAPAGLIFLOZIN PROPANEDIOL 10 MG PO TABS: ORAL_TABLET | ORAL | 8 refills | Status: DC

## 2022-11-18 MED ORDER — VYNDAMAX 61 MG PO CAPS: 1.0000 | ORAL_CAPSULE | Freq: Every day | ORAL | 8 refills | Status: DC

## 2022-11-18 MED ORDER — ENTRESTO 49-51 MG PO TABS: 1.0000 | ORAL_TABLET | Freq: Two times a day (BID) | ORAL | 8 refills | Status: DC

## 2022-11-18 MED ORDER — CARVEDILOL 12.5 MG PO TABS: 12.5000 mg | ORAL_TABLET | Freq: Two times a day (BID) | ORAL | 8 refills | Status: DC

## 2022-11-18 NOTE — Patient Instructions (Addendum)
Thank you for coming in today  Your physician recommends that you schedule a follow-up appointment in:  4 months with Dr. Haroldine Laws with echocardiogram You will receive a reminder letter in the mail a few months in advance. If you don't receive a letter, please call our office to schedule the follow-up appointment.  Your physician has requested that you have an echocardiogram. Echocardiography is a painless test that uses sound waves to create images of your heart. It provides your doctor with information about the size and shape of your heart and how well your heart's chambers and valves are working. This procedure takes approximately one hour. There are no restrictions for this procedure.       Do the following things EVERYDAY: Weigh yourself in the morning before breakfast. Write it down and keep it in a log. Take your medicines as prescribed Eat low salt foods--Limit salt (sodium) to 2000 mg per day.  Stay as active as you can everyday Limit all fluids for the day to less than 2 liters  At the [facility], you and your health needs are our priority. As part of our continuing mission to provide you with exceptional heart care, we have created designated Provider Care Teams. These Care Teams include your primary Cardiologist (physician) and Advanced Practice Providers (APPs- Physician Assistants and Nurse Practitioners) who all work together to provide you with the care you need, when you need it.   You may see any of the following providers on your designated Care Team at your next follow up: Dr [name] Dr Loralie Champagne Dr. Roxana Hires, NP Lyda Jester, [place] [facility] [place], [place] Forestine Na, NP Audry Riles, PharmD   Please be sure to bring in all your medications bottles to every appointment.    Thank you for choosing [facility]   If you have any questions or concerns before  your next appointment please send [place] a message through [place] or call our office at [phone].    TO LEAVE A MESSAGE FOR THE NURSE SELECT OPTION 2, PLEASE LEAVE A MESSAGE INCLUDING: YOUR NAME DATE OF BIRTH CALL BACK NUMBER REASON FOR CALL**this is important as we prioritize the call backs  YOU WILL RECEIVE A CALL BACK THE SAME DAY AS LONG AS YOU CALL BEFORE 4:00 PM

## 2022-11-18 NOTE — Progress Notes (Addendum)
[facility]   PCP: Gildardo Pounds, NP Primary Cardiologist: Dr [name] EP: Dr Curt Bears HF: Dr Haroldine Laws  HPI: CLUSTER ACRE is a 67 y.o. 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   Had Myoview in 5/17 with EF 22%. Subsequently underwent cath 6/17. EF 40-45%. Normal coronaries   Admitted to [facility] 8/19 with increased dyspnea. Diuresed with IV lasix. CTA performed and showed bilateral PE. Had AKI so diuretics held.  Placed on eliquis for PE.    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  Had u/s in 11/20 with acute LLE DVT. Switched from Eliquis to coumadin. Got COVID PNA over Christmas 12/20. CT chest showed no PE.  Follow up with Dr. Haroldine Laws 05/09/21, functional status a bit worse NYHA III-IIIb in setting of recent surgery, but volume OK. Echo showed EF 30-35%. Hydralazine recently stopped with low BP, also stopped Imdur.  Today he returns for HF follow up. We have not seen him since 8/22. Overall feeling fine. Has nasal congestion and dyspnea more at night, and he has SOB walking up steps. Continues with cake business. Denies palpitations, abnormal bleeding, CP, dizziness, edema, or PND. Appetite ok. No fever or chills. Weight at home 165 pounds. Taking all medications. Has not needed Lasix.   Cardiac Testing - Echo (8/22): EF 30-35%  - CTA 05/12/2018 --.large bilateral  PE   - CPX 04/03/18 FVC 2.77 (83%)      FEV1 2.37 (91%)        FEV1/FVC 86 (109%)        MVV 76 (61%)  Resting HR: 83 Peak HR: 125   (80% age predicted max HR) BP rest: 144/84 BP peak: 168/72 Peak VO2: 23.1 (76% predicted peak VO2) VE/VCO2 slope:  41 OUES: 2.18 Peak RER: 0.93 VE/MVV:  88% O2pulse:  14   (100% predicted O2pulse)  - PYP scan obtained which was positive (Visual grade 2. Quantitative 1.57).  - cMRI 04/01/18 1. Mild LVE with mild LVH septal thickness 13 mm. Diffuse hypokinesis worse in the  septum apex and lateral walls EF 28%  2. Abnormal post gadolinium inversion recovery sequences with failure to null and uptake in the mid/subepicardial septum, inferior wall and lateral walls with some apical sparing  Findings consistent with amyloidosis or infiltrative cardiomyopathy  - Echo (8/19):EF ~30% (continues to drop in setting of TTR amyloid). RV normal without evidence of acute strain. LE u/s without DVT.  - Echo (4/19): EF 30% to 35% (Dr. Haroldine Laws felt closer to 40-45%). Mild LVH Diffuse hypokinesis. Grade 1DD  - Echo (12/18): EF 50% to 55%.   Review of systems complete and found to be negative unless listed in HPI.   Past Medical History:  Diagnosis Date   Abnormal TSH 02/2016   AICD (automatic cardioverter/defibrillator) present    Anxiety    Blood clotting disorder (HCC)    Bronchiectasis (HCC)    left lower lung   Cardiac amyloidosis (HCC)    Cervical disc disease    Chronic combined systolic and diastolic CHF (congestive heart failure) ([place])    a. 01/2016 Echo: EF 25%, inf AK, diffuse sev HK, Gr1 DD, mild MR.   Coronary atherosclerosis    Depression    Diabetes mellitus without complication ([place])    Dyspnea    Dysrhythmia    Full thickness rotator cuff tear 06/2021   MRI   History of DVT (deep  vein thrombosis) 08/04/2019   venous doppler US   History of hiatal hernia    Hx of adenomatous polyp of colon    Hyperglycemia    Hyperlipidemia    Hypertension    Hypertensive heart disease    Myocardial infarction [facility])    ??  maybe   NICM (nonischemic cardiomyopathy) ([place])    a. 01/2016 Echo: EF 25%, inf AK, diffuse sev HK, Gr1 DD, mild MR; b. 01/2016 MV: EF 22%, no isch/infarct;  c. 02/2016 Cath: Nl cors, EF 35-45%.   Osteoarthritis of hips, bilateral    Paraseptal emphysema (HCC)    mild   Peanut allergy    Pneumonia    Pre-diabetes    Presence of implantable cardioverter-defibrillator (ICD)    S/P repair of ventral hernia    Seizures (HCC)    with  penicillin   Sensorineural hearing loss    Current Outpatient Medications  Medication Sig Dispense Refill   acetaminophen (TYLENOL) 500 MG tablet Take 1,000 mg by mouth every 6 (six) hours as needed for moderate pain or headache.     albuterol (VENTOLIN HFA) 108 (90 Base) MCG/ACT inhaler INHALE 2 PUFFS INTO THE LUNGS EVERY 6 HOURS AS NEEDED FOR WHEEZING OR SHORTNESS OF BREATH 8.5 g 2   Aromatic Inhalants (VICKS VAPOINHALER) INHA Inhale 1 Dose into the lungs daily as needed (congestion).     atorvastatin (LIPITOR) 80 MG tablet Take 1 tablet (80 mg total) by mouth daily. 90 tablet 3   carvedilol (COREG) 12.5 MG tablet TAKE 1 TABLET(12.5 MG) BY MOUTH TWICE DAILY WITH A MEAL. NEED FOLLOW UP APPOINTMENT FOR ANYMORE REFILLS 60 tablet 0   Chlorphen-Phenyleph-ASA (ALKA-SELTZER PLUS COLD PO) Take 1-2 tablets by mouth 2 (two) times daily as needed (cold symptoms).      Cholecalciferol (VITAMIN D) 50 MCG (2000 UT) tablet Take 2,000 Units by mouth daily.     cyclobenzaprine (FLEXERIL) 5 MG tablet Take 1 tablet (5 mg total) by mouth 3 (three) times daily as needed for muscle spasms. 30 tablet 1   dapagliflozin propanediol (FARXIGA) 10 MG TABS tablet TAKE 1 TABLET(10 MG) BY MOUTH DAILY. NEED FOLLOW UP APPOINTMENT FOR ANYMORE REFILLS 15 tablet 0   diclofenac Sodium (VOLTAREN) 1 % GEL Apply 2 g topically 4 (four) times daily as needed (pain).     ENTRESTO 49-51 MG TAKE 1 TABLET BY MOUTH TWICE DAILY 60 tablet 11   fluticasone (FLONASE) 50 MCG/ACT nasal spray SHAKE LIQUID AND USE 1 SPRAY IN EACH NOSTRIL DAILY 16 g 1   loratadine (CLARITIN) 10 MG tablet Take 1 tablet (10 mg total) by mouth daily. 30 tablet 5   metFORMIN (GLUCOPHAGE) 500 MG tablet Take 1 tablet (500 mg total) by mouth daily with breakfast. 90 tablet 1   methocarbamol (ROBAXIN) 500 MG tablet Take 1 tablet (500 mg total) by mouth every 6 (six) hours as needed for muscle spasms. 60 tablet 0   Misc. Devices MISC by Does not apply route. Flutter valve  daily     spironolactone (ALDACTONE) 25 MG tablet TAKE 1/2 TABLET(12.5 MG) BY MOUTH DAILY (Patient taking differently: Take 12.5 mg by mouth daily.) 15 tablet 11   Tafamidis (VYNDAMAX) 61 MG CAPS Take 1 capsule by mouth daily. 30 capsule 0   warfarin (COUMADIN) 5 MG tablet TAKE 1 TO 2 TABLETS BY MOUTH DAILY AS DIRECTED BY COUMADIN CLINIC 150 tablet 1   No current facility-administered medications for this encounter.   Allergies  Allergen Reactions   Peanut-Containing  Drug Products Swelling   Penicillins Other (See Comments)    CONVULSIONS Did it involve swelling of the face/tongue/throat, SOB, or low BP? No Did it involve sudden or severe rash/hives, skin peeling, or any reaction on the inside of your mouth or nose? No Did you need to seek medical attention at a hospital or doctor's office? Yes When did it last happen?      25 years If all above answers are "NO", may proceed with cephalosporin use.    Decadron [Dexamethasone] Itching   Ivp Dye [Iodinated Contrast Media]    Social History   Socioeconomic History   Marital status: Legally Separated    Spouse name: Not on file   Number of children: 3   Years of education: Not on file   Highest education level: Not on file  Occupational History   Not on file  Tobacco Use   Smoking status: Former    Packs/day: 1.50    Years: 18.00    Total pack years: 27.00    Types: Cigarettes    Quit date: 03/15/2003    Years since quitting: 19.6   Smokeless tobacco: Never  Vaping Use   Vaping Use: Never used  Substance and Sexual Activity   Alcohol use: No    Comment: none since 2004   Drug use: No    Comment: former  none since 2004   Sexual activity: Not Currently  Other Topics Concern   Not on file  Social History Narrative   Not on file   Social Determinants of Health   Financial Resource Strain: Low Risk  (09/13/2022)   Overall Financial Resource Strain (CARDIA)    Difficulty of Paying Living Expenses: Not hard at all  Food  Insecurity: No Food Insecurity (09/13/2022)   Hunger Vital Sign    Worried About Running Out of Food in the Last Year: Never true    [place] in the Last Year: Never true  Transportation Needs: No Transportation Needs (09/13/2022)   PRAPARE - Hydrologist (Medical): No    Lack of Transportation (Non-Medical): No  Physical Activity: Insufficiently Active (09/13/2022)   Exercise Vital Sign    Days of Exercise per Week: 1 day    Minutes of Exercise per Session: 30 min  Stress: Not on file  Social Connections: Unknown (09/13/2022)   Social Connection and Isolation Panel [NHANES]    Frequency of Communication with Friends and Family: Three times a week    Frequency of Social Gatherings with Friends and Family: Three times a week    Attends Religious Services: Not on file    Active Member of Clubs or Organizations: Not on file    Attends Club or Organization Meetings: Not on file    Marital Status: Widowed  Human resources officer Violence: Not on file    Family History  Problem Relation Age of Onset   Diabetes Mother    Diabetes Sister    Clotting disorder Neg Hx    Lung disease Neg Hx    Colon cancer Neg Hx    Esophageal cancer Neg Hx    Stomach cancer Neg Hx    Pancreatic cancer Neg Hx    BP 110/82   Pulse 92   Wt 74.9 kg (165 lb 3.2 oz)   SpO2 98%   BMI 26.66 kg/m   Wt Readings from Last 3 Encounters:  11/18/22 74.9 kg (165 lb 3.2 oz)  11/08/22 74.8 kg (165 lb)  10/07/22  73.9 kg (163 lb)   PHYSICAL EXAM: General:  NAD. No resp difficulty, walked into clinic HEENT: Normal Neck: Supple. No JVD. Carotids 2+ bilat; no bruits. No lymphadenopathy or thryomegaly appreciated. Cor: PMI nondisplaced. Regular rate & rhythm. No rubs, gallops or murmurs. Lungs: Clear Abdomen: Soft, nontender, nondistended. No hepatosplenomegaly. No bruits or masses. Good bowel sounds. Extremities: No cyanosis, clubbing, rash, edema Neuro: Alert & oriented x 3,  cranial nerves grossly intact. Moves all 4 extremities w/o difficulty. Affect pleasant.  ECG: SR 72 bpm (personally reviewed).  ICD interrogation (personally reviewed): CorVue stable suggesting euvolemia.  ASSESSMENT & PLAN:  1. Chronic Systolic Heart Failure, NICM.  - EF dropped from 50-55%-->04/2018 25-30%  - Cath 6/17 normal cors. - cMRI (7/19): LVEF 28% inability to blank myocardium c/w amyloid  - PYP and cMRI testing strongly suggestive of TTR amyloid. No M-spike, normal IFE on myeloma panel.  - Repeat PYP (2/21). 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 - Improved NYHA II today, he is not volume overloaded today on exam or by CorVue - Continue Lasix 40 mg daily PRN. Has not needed any recently. - Continue Farxiga 10 mg daily. - Continue carvedilol 12.5 mg bid. - Continue Entresto 49/51 mg bid.  - Continue spiro 12.5 mg daily. - Device transmission as above. - Recent labs reviewed from 10/07/22 and are stable; K 4.5, SCr 1.08, hgb 12.9 - Repeat echo next visit  2. DMII - Stable. On metformin - On SGLT2i.  3. HTN - BP well-controlled. - GDMT as above.  4. PE - CTA 04/2018 with large bilateral PE. - Was on Eliquis. Now back on warfarin due to acute LLE DVT in 11/20. - CT chest 12/20 no PE. - [facility]. - VQ ordered to exclude chronic PE. - Needs lifelong AC.  Follow up in 4 months with Dr. Haroldine Laws + echo  [place], [place]  11/18/22

## 2022-11-19 ENCOUNTER — Other Ambulatory Visit (HOSPITAL_COMMUNITY): Payer: Self-pay

## 2022-11-19 ENCOUNTER — Other Ambulatory Visit: Payer: Self-pay

## 2022-11-19 MED ORDER — VYNDAMAX 61 MG PO CAPS
1.0000 | ORAL_CAPSULE | Freq: Every day | ORAL | 11 refills | Status: DC
  Filled 2022-11-19 (×2): qty 30, 30d supply, fill #0
  Filled 2023-01-01: qty 30, 30d supply, fill #1
  Filled 2023-01-21: qty 30, 30d supply, fill #2
  Filled 2023-02-18: qty 30, 30d supply, fill #3
  Filled 2023-03-21: qty 30, 30d supply, fill #4
  Filled 2023-04-22: qty 30, 30d supply, fill #5
  Filled 2023-05-15: qty 30, 30d supply, fill #6
  Filled 2023-06-20: qty 30, 30d supply, fill #7
  Filled 2023-07-23: qty 30, 30d supply, fill #8
  Filled 2023-08-27: qty 30, 30d supply, fill #9
  Filled 2023-09-22: qty 30, 30d supply, fill #10
  Filled 2023-10-21: qty 30, 30d supply, fill #11

## 2022-11-20 ENCOUNTER — Other Ambulatory Visit (HOSPITAL_COMMUNITY): Payer: Self-pay

## 2022-11-21 ENCOUNTER — Other Ambulatory Visit (HOSPITAL_COMMUNITY): Payer: Self-pay

## 2022-12-03 ENCOUNTER — Other Ambulatory Visit: Payer: Self-pay | Admitting: Internal Medicine

## 2022-12-05 ENCOUNTER — Other Ambulatory Visit (HOSPITAL_COMMUNITY): Payer: Self-pay

## 2022-12-05 ENCOUNTER — Ambulatory Visit: Payer: 59 | Attending: Internal Medicine | Admitting: *Deleted

## 2022-12-05 DIAGNOSIS — Z7901 Long term (current) use of anticoagulants: Secondary | ICD-10-CM

## 2022-12-05 DIAGNOSIS — I824Y9 Acute embolism and thrombosis of unspecified deep veins of unspecified proximal lower extremity: Secondary | ICD-10-CM

## 2022-12-05 LAB — POCT INR: INR: 2.1 (ref 2.0–3.0)

## 2022-12-05 NOTE — Patient Instructions (Signed)
Description   Continue taking warfarin 1 tablet ('5mg'$ ) daily, except 2 tablets ('10mg'$ ) on Friday.  Recheck INR 4 weeks. [facility] Clinic [phone]

## 2022-12-07 ENCOUNTER — Emergency Department (HOSPITAL_COMMUNITY)
Admission: EM | Admit: 2022-12-07 | Discharge: 2022-12-07 | Disposition: A | Discharge status: Home / Self Care | Payer: 59 | Attending: Emergency Medicine | Admitting: Emergency Medicine

## 2022-12-07 ENCOUNTER — Emergency Department (HOSPITAL_COMMUNITY): Payer: 59

## 2022-12-07 DIAGNOSIS — Y92410 Unspecified street and highway as the place of occurrence of the external cause: Secondary | ICD-10-CM | POA: Diagnosis not present

## 2022-12-07 DIAGNOSIS — Z91010 Allergy to peanuts: Secondary | ICD-10-CM | POA: Insufficient documentation

## 2022-12-07 DIAGNOSIS — Z7901 Long term (current) use of anticoagulants: Secondary | ICD-10-CM | POA: Insufficient documentation

## 2022-12-07 DIAGNOSIS — S0990XA Unspecified injury of head, initial encounter: Secondary | ICD-10-CM | POA: Insufficient documentation

## 2022-12-07 DIAGNOSIS — G319 Degenerative disease of nervous system, unspecified: Secondary | ICD-10-CM | POA: Insufficient documentation

## 2022-12-07 DIAGNOSIS — R059 Cough, unspecified: Secondary | ICD-10-CM | POA: Diagnosis present

## 2022-12-07 MED ORDER — DIPHENHYDRAMINE HCL 50 MG/ML IJ SOLN: 25.0000 mg | Freq: Once | INTRAMUSCULAR | Status: DC

## 2022-12-07 MED ORDER — ONDANSETRON HCL 4 MG/2ML IJ SOLN: 4.0000 mg | Freq: Once | INTRAMUSCULAR | Status: DC
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
## 2022-12-07 NOTE — Discharge Instructions (Signed)
The workup in the emergency room is reassuring.  Please take OTC meds for pain.  We expect that you might be more sore tomorrow.

## 2022-12-12 ENCOUNTER — Other Ambulatory Visit (HOSPITAL_COMMUNITY): Payer: Self-pay

## 2022-12-19 ENCOUNTER — Other Ambulatory Visit: Payer: Self-pay | Admitting: Nurse Practitioner

## 2022-12-19 DIAGNOSIS — R058 Other specified cough: Secondary | ICD-10-CM

## 2022-12-24 ENCOUNTER — Ambulatory Visit (INDEPENDENT_AMBULATORY_CARE_PROVIDER_SITE_OTHER): Payer: 59

## 2022-12-24 DIAGNOSIS — I428 Other cardiomyopathies: Secondary | ICD-10-CM | POA: Diagnosis not present

## 2022-12-24 LAB — CUP PACEART REMOTE DEVICE CHECK
Battery Remaining Longevity: 60 mo
Battery Remaining Percentage: 59 %
Battery Voltage: 2.96 V
Brady Statistic RV Percent Paced: 1 %
Date Time Interrogation Session: 20240326071317
HighPow Impedance: 59 Ohm
HighPow Impedance: 59 Ohm
Implantable Lead Connection Status: 753985
Implantable Lead Implant Date: 20190918
Implantable Lead Location: 753860
Implantable Lead Model: 7122
Implantable Pulse Generator Implant Date: 20190918
Lead Channel Impedance Value: 380 Ohm
Lead Channel Pacing Threshold Amplitude: 0.5 V
Lead Channel Pacing Threshold Pulse Width: 0.5 ms
Lead Channel Sensing Intrinsic Amplitude: 11.8 mV
Lead Channel Setting Pacing Amplitude: 2.5 V
Lead Channel Setting Pacing Pulse Width: 0.5 ms
Lead Channel Setting Sensing Sensitivity: 0.5 mV
Pulse Gen Serial Number: 9796641

## 2023-01-01 ENCOUNTER — Other Ambulatory Visit (HOSPITAL_COMMUNITY): Payer: Self-pay

## 2023-01-02 ENCOUNTER — Ambulatory Visit: Payer: 59 | Attending: Internal Medicine | Admitting: *Deleted

## 2023-01-02 DIAGNOSIS — Z7901 Long term (current) use of anticoagulants: Secondary | ICD-10-CM | POA: Diagnosis not present

## 2023-01-02 DIAGNOSIS — I824Y9 Acute embolism and thrombosis of unspecified deep veins of unspecified proximal lower extremity: Secondary | ICD-10-CM

## 2023-01-02 LAB — POCT INR: INR: 2.3 (ref 2.0–3.0)

## 2023-01-02 NOTE — Patient Instructions (Signed)
Description   Continue taking warfarin 1 tablet (5mg ) daily, except 2 tablets (10mg ) on Friday.  Recheck INR 5 weeks. [facility] Clinic [phone]

## 2023-01-20 ENCOUNTER — Encounter (INDEPENDENT_AMBULATORY_CARE_PROVIDER_SITE_OTHER): Payer: 59 | Admitting: Ophthalmology

## 2023-01-20 DIAGNOSIS — E119 Type 2 diabetes mellitus without complications: Secondary | ICD-10-CM

## 2023-01-20 DIAGNOSIS — H35033 Hypertensive retinopathy, bilateral: Secondary | ICD-10-CM

## 2023-01-20 DIAGNOSIS — H25813 Combined forms of age-related cataract, bilateral: Secondary | ICD-10-CM

## 2023-01-20 DIAGNOSIS — I10 Essential (primary) hypertension: Secondary | ICD-10-CM

## 2023-01-21 ENCOUNTER — Other Ambulatory Visit: Payer: Self-pay

## 2023-01-27 ENCOUNTER — Other Ambulatory Visit: Payer: Self-pay

## 2023-01-28 ENCOUNTER — Other Ambulatory Visit: Payer: Self-pay | Admitting: Nurse Practitioner

## 2023-01-28 DIAGNOSIS — J479 Bronchiectasis, uncomplicated: Secondary | ICD-10-CM

## 2023-01-29 ENCOUNTER — Other Ambulatory Visit: Payer: Self-pay

## 2023-02-04 NOTE — Progress Notes (Signed)
Remote ICD transmission.   

## 2023-02-05 ENCOUNTER — Encounter: Payer: Self-pay | Admitting: Nurse Practitioner

## 2023-02-05 ENCOUNTER — Ambulatory Visit: Payer: 59 | Attending: Nurse Practitioner | Admitting: Nurse Practitioner

## 2023-02-05 VITALS — BP 111/72 | HR 80 | Ht 66.0 in | Wt 165.8 lb

## 2023-02-05 DIAGNOSIS — M5412 Radiculopathy, cervical region: Secondary | ICD-10-CM

## 2023-02-05 DIAGNOSIS — E119 Type 2 diabetes mellitus without complications: Secondary | ICD-10-CM

## 2023-02-05 DIAGNOSIS — E1169 Type 2 diabetes mellitus with other specified complication: Secondary | ICD-10-CM | POA: Diagnosis not present

## 2023-02-05 DIAGNOSIS — E785 Hyperlipidemia, unspecified: Secondary | ICD-10-CM | POA: Diagnosis not present

## 2023-02-05 DIAGNOSIS — I10 Essential (primary) hypertension: Secondary | ICD-10-CM

## 2023-02-05 DIAGNOSIS — D649 Anemia, unspecified: Secondary | ICD-10-CM

## 2023-02-05 DIAGNOSIS — Z7984 Long term (current) use of oral hypoglycemic drugs: Secondary | ICD-10-CM

## 2023-02-05 DIAGNOSIS — R058 Other specified cough: Secondary | ICD-10-CM

## 2023-02-05 DIAGNOSIS — Z23 Encounter for immunization: Secondary | ICD-10-CM

## 2023-02-05 MED ORDER — ATORVASTATIN CALCIUM 80 MG PO TABS: 80.0000 mg | ORAL_TABLET | Freq: Every day | ORAL | 3 refills | Status: DC

## 2023-02-05 MED ORDER — LORATADINE 10 MG PO TABS: 10.0000 mg | ORAL_TABLET | Freq: Every day | ORAL | 3 refills | Status: DC

## 2023-02-05 MED ORDER — METFORMIN HCL 500 MG PO TABS: 500.0000 mg | ORAL_TABLET | Freq: Every day | ORAL | 1 refills | Status: DC

## 2023-02-05 NOTE — Progress Notes (Signed)
Assessment & Plan:  Durwin was seen today for hypertension.  Diagnoses and all orders for this visit:  Primary hypertension  Type 2 diabetes mellitus without complication, without long-term current use of insulin  Diabetes is well controlled. Taking farxiga and metformin as prescribed -     Hemoglobin A1c -     CMP14+EGFR -     metFORMIN (GLUCOPHAGE) 500 MG tablet; Take 1 tablet (500 mg total) by mouth daily with breakfast. -     Microalbumin / creatinine urine ratio  Need for pneumococcal 20-valent conjugate vaccination -     Pneumococcal conjugate vaccine 20-valent  Anemia, unspecified type -     CBC with Differential  Upper airway cough syndrome -     loratadine (CLARITIN) 10 MG tablet; Take 1 tablet (10 mg total) by mouth daily.  Hyperlipidemia associated with type 2 diabetes mellitus  LDL at goal with atorvastatin 80 mg (HIGH INTENSITY) -     atorvastatin (LIPITOR) 80 MG tablet; Take 1 tablet (80 mg total) by mouth daily. Lab Results  Component Value Date   LDLCALC 78 10/07/2022     Cervical radiculopathy -     DG Cervical Spine Complete; Future    Patient has been counseled on age-appropriate routine health concerns for screening and prevention. These are reviewed and up-to-date. Referrals have been placed accordingly. Immunizations are up-to-date or declined.    Subjective:   Chief Complaint  Patient presents with   Hypertension   HPI Jonathan Dickson 67 y.o. male presents to office today for follow up to HTN. He also endorses symptoms of cervical radiculopathy today.  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 There was not an injury that may have caused the pain. The most recent episode started a few weeks ago and is staying constant. The pain is located in the  right upper arm  with radiation down into right forearm, right hand and right index and pinky finger. It is described as tingling and numbness, is moderate in intensity, occurring intermittently and lasting a few seconds at at time. Symptoms are worse no particular time of the day.  Aggravating factors: none Relieving factors: none.  He has tried nothing for the symptoms. "Feels like electricity or electrical shock in the tips of my 2 fingers".  He does have a history of cervical fusion (C3-4) several years ago (03-2021)  Associated symptoms: No chest pain No fever  No headaches No joint pains  Yes numbness in SEE ABOVE No sore throat  No swallowing problems Yes tingling in SEE ABOVE  No weakness    Anemia He has a history of anemia. Last colonoscopy 2017 with recommended f/u 2022 (5 years). He has been referred to GI for further workup. He is on coumadin.   Review of Systems  Constitutional:  Negative for fever, malaise/fatigue and weight loss.  HENT: Negative.  Negative for nosebleeds.   Eyes: Negative.  Negative for blurred vision, double vision and photophobia.  Respiratory: Negative.  Negative for cough and shortness of breath.   Cardiovascular: Negative.  Negative for chest pain, palpitations and leg  swelling.  Gastrointestinal: Negative.  Negative for heartburn, nausea and vomiting.  Musculoskeletal: Negative.  Negative for myalgias.  Neurological:  Positive for tingling and sensory change. Negative for dizziness, focal weakness, seizures and headaches.  Psychiatric/Behavioral: Negative.  Negative for suicidal ideas.     Past Medical History:  Diagnosis Date   Abnormal TSH 02/2016   AICD (automatic cardioverter/defibrillator) present    Anxiety    Blood clotting disorder  (HCC)    Bronchiectasis (HCC)    left lower lung   Cardiac amyloidosis (HCC)    Cervical disc disease    Chronic combined systolic and diastolic CHF (congestive heart failure) (HCC)    a. 01/2016 Echo: EF 25%, inf AK, diffuse sev HK, Gr1 DD, mild MR.   Coronary atherosclerosis    Depression    Diabetes mellitus without complication (HCC)    Dyspnea    Dysrhythmia    Full thickness rotator cuff tear 06/2021   MRI   History of DVT (deep vein thrombosis) 08/04/2019   venous doppler US   History of hiatal hernia    Hx of adenomatous polyp of colon    Hyperglycemia    Hyperlipidemia    Hypertension    Hypertensive heart disease    Myocardial infarction [facility])    ??  maybe   NICM (nonischemic cardiomyopathy) (HCC)    a. 01/2016 Echo: EF 25%, inf AK, diffuse sev HK, Gr1 DD, mild MR; b. 01/2016 MV: EF 22%, no isch/infarct;  c. 02/2016 Cath: Nl cors, EF 35-45%.   Osteoarthritis of hips, bilateral    Paraseptal emphysema (HCC)    mild   Peanut allergy    Pneumonia    Pre-diabetes    Presence of implantable cardioverter-defibrillator (ICD)    S/P repair of ventral hernia    Seizures (HCC)    with penicillin   Sensorineural hearing loss     Past Surgical History:  Procedure Laterality Date   ANTERIOR CERVICAL DECOMP/DISCECTOMY FUSION N/A 07/10/2016   Procedure: ANTERIOR CERVICAL DECOMPRESSION FUSION CERVICAL 4-5, CERVICAL 5-6, CERVICAL 6-7 WITH INSTRUMENTATION AND ALLOGRAFT;  Surgeon: Estill Bamberg, MD;  Location: [facility] OR;  Service: Orthopedics;  Laterality: N/A;   ANTERIOR CERVICAL DECOMP/DISCECTOMY FUSION N/A 04/19/2021   Procedure: ANTERIOR CERVICAL DECOMPRESSION FUSION CERVICAL 3- CERVICAL 4 WITH INSTRUMENTATION AND ALLOGRAFT;  Surgeon: Estill Bamberg, MD;  Location: [facility] OR;  Service: Orthopedics;  Laterality: N/A;   CARDIAC CATHETERIZATION N/A 03/11/2016   Procedure: Left Heart Cath and Coronary Angiography;  Surgeon: Marykay Lex, MD;  Location: [facility] INVASIVE CV LAB;  Service:  Cardiovascular;  Laterality: N/A;   COLONOSCOPY     ICD IMPLANT N/A 06/17/2018   Procedure: ICD IMPLANT;  Surgeon: Regan Lemming, MD;  Location: [facility] INVASIVE CV LAB;  Service: Cardiovascular;  Laterality: N/A;   Thumb surgery Right    VENTRAL HERNIA REPAIR N/A 10/25/2019   Procedure: PRIMARY VENTRAL HERNIA REPAIR;  Surgeon: Berna Bue, MD;  Location: [facility] ORS;  Service: General;  Laterality: N/A;    Family History  Problem Relation Age of Onset   Diabetes Mother    Diabetes Sister    Clotting disorder Neg Hx    Lung disease Neg Hx    Colon cancer Neg Hx    Esophageal cancer Neg Hx    Stomach cancer Neg Hx    Pancreatic cancer Neg Hx     Social History Reviewed with no changes to be made today.   Outpatient Medications Prior to Visit  Medication Sig Dispense  Refill   acetaminophen (TYLENOL) 500 MG tablet Take 1,000 mg by mouth every 6 (six) hours as needed for moderate pain or headache.     albuterol (VENTOLIN HFA) 108 (90 Base) MCG/ACT inhaler INHALE 2 PUFFS INTO THE LUNGS EVERY 6 HOURS AS NEEDED FOR WHEEZING OR SHORTNESS OF BREATH 6.7 g 3   Aromatic Inhalants (VICKS VAPOINHALER) INHA Inhale 1 Dose into the lungs daily as needed (congestion).     carvedilol (COREG) 12.5 MG tablet Take 1 tablet (12.5 mg total) by mouth 2 (two) times daily with a meal. 60 tablet 8   Chlorphen-Phenyleph-ASA (ALKA-SELTZER PLUS COLD PO) Take 1-2 tablets by mouth 2 (two) times daily as needed (cold symptoms).      cholecalciferol (VITAMIN D3) 25 MCG (1000 UNIT) tablet Take by mouth.     cyclobenzaprine (FLEXERIL) 5 MG tablet Take 1 tablet (5 mg total) by mouth 3 (three) times daily as needed for muscle spasms. 30 tablet 1   dapagliflozin propanediol (FARXIGA) 10 MG TABS tablet Take 1 tablet (10 mg total) by mouth daily. 30 tablet 6   diclofenac Sodium (VOLTAREN) 1 % GEL Apply 2 g topically 4 (four) times daily as needed (pain).     fluticasone (FLONASE) 50 MCG/ACT nasal spray SHAKE LIQUID AND  USE 1 SPRAY IN EACH NOSTRIL DAILY 16 g 1   methocarbamol (ROBAXIN) 500 MG tablet Take 1 tablet (500 mg total) by mouth every 6 (six) hours as needed for muscle spasms. 60 tablet 0   Misc. Devices MISC by Does not apply route. Flutter valve daily     sacubitril-valsartan (ENTRESTO) 49-51 MG Take 1 tablet by mouth 2 (two) times daily. 60 tablet 8   spironolactone (ALDACTONE) 25 MG tablet Take 0.5 tablets (12.5 mg total) by mouth daily. TAKE 1/2 TABLET(12.5 MG) BY MOUTH DAILY 15 tablet 11   Tafamidis (VYNDAMAX) 61 MG CAPS Take 1 capsule by mouth daily. 30 capsule 11   warfarin (COUMADIN) 5 MG tablet TAKE 1 TO 2 TABLETS BY MOUTH DAILY AS DIRECTED BY COUMADIN CLINIC 150 tablet 1   atorvastatin (LIPITOR) 80 MG tablet Take 1 tablet (80 mg total) by mouth daily. 90 tablet 3   Cholecalciferol (VITAMIN D) 50 MCG (2000 UT) tablet Take 2,000 Units by mouth daily.     loratadine (CLARITIN) 10 MG tablet Take 1 tablet (10 mg total) by mouth daily. 30 tablet 5   metFORMIN (GLUCOPHAGE) 500 MG tablet Take 1 tablet (500 mg total) by mouth daily with breakfast. 90 tablet 1   No facility-administered medications prior to visit.    Allergies  Allergen Reactions   Peanut-Containing Drug Products Swelling   Penicillins Other (See Comments)    CONVULSIONS  Did it involve swelling of the face/tongue/throat, SOB, or low BP? No  Did it involve sudden or severe rash/hives, skin peeling, or any reaction on the inside of your mouth or nose? No  Did you need to seek medical attention at a hospital or doctor's office? Yes  When did it last happen?      25 years  If all above answers are "NO", may proceed with cephalosporin use.  CONVULSIONS, Has patient had a PCN reaction causing immediate rash, facial/tongue/throat swelling, SOB or lightheadedness with hypotension: No, Has patient had a PCN reaction causing severe rash involving mucus membranes or skin necrosis: No, Has patient had a PCN reaction that required  hospitalization No, Has patient had a PCN reaction occurring within the last 10 years: No, If all of the  above answers are "NO", then may proceed with Cephalosporin use.   Decadron [Dexamethasone] Itching   Ivp Dye [Iodinated Contrast Media]        Objective:    BP 111/72 (BP Location: Left Arm, Patient Position: Sitting, Cuff Size: Normal)   Pulse 80   Ht 5\' 6"  (1.676 m)   Wt 165 lb 12.8 oz (75.2 kg)   SpO2 99%   BMI 26.76 kg/m  Wt Readings from Last 3 Encounters:  02/05/23 165 lb 12.8 oz (75.2 kg)  11/18/22 165 lb 3.2 oz (74.9 kg)  11/08/22 165 lb (74.8 kg)    Physical Exam Vitals and nursing note reviewed.  Constitutional:      Appearance: He is well-developed.  HENT:     Head: Normocephalic and atraumatic.  Cardiovascular:     Rate and Rhythm: Normal rate and regular rhythm.     Heart sounds: Normal heart sounds. No murmur heard.    No friction rub. No gallop.  Pulmonary:     Effort: Pulmonary effort is normal. No tachypnea or respiratory distress.     Breath sounds: Normal breath sounds. No decreased breath sounds, wheezing, rhonchi or rales.  Chest:     Chest wall: No tenderness.  Abdominal:     General: Bowel sounds are normal.     Palpations: Abdomen is soft.  Musculoskeletal:        General: Normal range of motion.     Cervical back: Normal range of motion.  Skin:    General: Skin is warm and dry.  Neurological:     Mental Status: He is alert and oriented to person, place, and time.     Coordination: Coordination normal.  Psychiatric:        Behavior: Behavior normal. Behavior is cooperative.        Thought Content: Thought content normal.        Judgment: Judgment normal.          Patient has been counseled extensively about nutrition and exercise as well as the importance of adherence with medications and regular follow-up. The patient was given clear instructions to go to ER or return to medical center if symptoms don't improve, worsen or new  problems develop. The patient verbalized understanding.   Follow-up: No follow-ups on file.   Claiborne Rigg, FNP-BC [facility] and [facility] [place], [place] [phone]   02/05/2023, 9:21 AM

## 2023-02-05 NOTE — Patient Instructions (Addendum)
[facility] [address] [place], [place] [address] ?PH# [phone] ?

## 2023-02-06 ENCOUNTER — Ambulatory Visit
Admission: RE | Admit: 2023-02-06 | Discharge: 2023-02-06 | Disposition: A | Discharge status: Home / Self Care | Payer: 59 | Source: Ambulatory Visit | Attending: Nurse Practitioner | Admitting: Nurse Practitioner

## 2023-02-06 ENCOUNTER — Ambulatory Visit: Payer: 59 | Attending: Cardiology | Admitting: *Deleted

## 2023-02-06 DIAGNOSIS — Z7901 Long term (current) use of anticoagulants: Secondary | ICD-10-CM | POA: Diagnosis not present

## 2023-02-06 DIAGNOSIS — I824Y9 Acute embolism and thrombosis of unspecified deep veins of unspecified proximal lower extremity: Secondary | ICD-10-CM

## 2023-02-06 DIAGNOSIS — M5412 Radiculopathy, cervical region: Secondary | ICD-10-CM

## 2023-02-06 LAB — CMP14+EGFR
ALT: 21 IU/L (ref 0–44)
AST: 29 IU/L (ref 0–40)
Albumin/Globulin Ratio: 1.7 (ref 1.2–2.2)
Albumin: 4.5 g/dL (ref 3.9–4.9)
Alkaline Phosphatase: 76 IU/L (ref 44–121)
BUN/Creatinine Ratio: 16 (ref 10–24)
BUN: 20 mg/dL (ref 8–27)
Bilirubin Total: 0.3 mg/dL (ref 0.0–1.2)
CO2: 16 mmol/L — ABNORMAL LOW (ref 20–29)
Calcium: 9.9 mg/dL (ref 8.6–10.2)
Chloride: 104 mmol/L (ref 96–106)
Creatinine, Ser: 1.22 mg/dL (ref 0.76–1.27)
Globulin, Total: 2.6 g/dL (ref 1.5–4.5)
Glucose: 92 mg/dL (ref 70–99)
Potassium: 4.9 mmol/L (ref 3.5–5.2)
Sodium: 139 mmol/L (ref 134–144)
Total Protein: 7.1 g/dL (ref 6.0–8.5)
eGFR: 65 mL/min/{1.73_m2} (ref >59)

## 2023-02-06 LAB — CBC WITH DIFFERENTIAL/PLATELET
Basophils Absolute: 0.1 10*3/uL (ref 0.0–0.2)
Basos: 1 %
EOS (ABSOLUTE): 0.2 10*3/uL (ref 0.0–0.4)
Eos: 3 %
Hematocrit: 42.3 % (ref 37.5–51.0)
Hemoglobin: 12.6 g/dL — ABNORMAL LOW (ref 13.0–17.7)
Immature Grans (Abs): 0 10*3/uL (ref 0.0–0.1)
Immature Granulocytes: 0 %
Lymphocytes Absolute: 2.3 10*3/uL (ref 0.7–3.1)
Lymphs: 37 %
MCH: 23.4 pg — ABNORMAL LOW (ref 26.6–33.0)
MCHC: 29.8 g/dL — ABNORMAL LOW (ref 31.5–35.7)
MCV: 79 fL (ref 79–97)
Monocytes Absolute: 0.5 10*3/uL (ref 0.1–0.9)
Monocytes: 8 %
Neutrophils Absolute: 3.1 10*3/uL (ref 1.4–7.0)
Neutrophils: 51 %
Platelets: 235 10*3/uL (ref 150–450)
RBC: 5.39 x10E6/uL (ref 4.14–5.80)
RDW: 17.3 % — ABNORMAL HIGH (ref 11.6–15.4)
WBC: 6.3 10*3/uL (ref 3.4–10.8)

## 2023-02-06 LAB — POCT INR: INR: 2 (ref 2.0–3.0)

## 2023-02-06 LAB — MICROALBUMIN / CREATININE URINE RATIO
Creatinine, Urine: 69.8 mg/dL
Microalb/Creat Ratio: 20 mg/g creat (ref 0–29)
Microalbumin, Urine: 13.9 ug/mL

## 2023-02-06 LAB — HEMOGLOBIN A1C
Est. average glucose Bld gHb Est-mCnc: 148 mg/dL
Hgb A1c MFr Bld: 6.8 % — ABNORMAL HIGH (ref 4.8–5.6)

## 2023-02-06 NOTE — Patient Instructions (Signed)
Description   Today take 1.5 tablets then continue taking warfarin 1 tablet (5mg ) daily, except 2 tablets (10mg ) on Friday.  Recheck INR 6 weeks. [facility] [phone] Procedure Clearance Fax [phone] or [phone]

## 2023-02-18 ENCOUNTER — Other Ambulatory Visit (HOSPITAL_COMMUNITY): Payer: Self-pay

## 2023-02-18 ENCOUNTER — Other Ambulatory Visit: Payer: Self-pay

## 2023-02-19 ENCOUNTER — Other Ambulatory Visit (HOSPITAL_COMMUNITY): Payer: Self-pay | Admitting: Family Medicine

## 2023-02-19 DIAGNOSIS — R29898 Other symptoms and signs involving the musculoskeletal system: Secondary | ICD-10-CM

## 2023-02-19 DIAGNOSIS — M5412 Radiculopathy, cervical region: Secondary | ICD-10-CM

## 2023-02-21 ENCOUNTER — Other Ambulatory Visit (HOSPITAL_COMMUNITY): Payer: Self-pay

## 2023-03-14 ENCOUNTER — Other Ambulatory Visit (HOSPITAL_COMMUNITY): Payer: Self-pay

## 2023-03-20 ENCOUNTER — Ambulatory Visit: Payer: 59 | Attending: Cardiology | Admitting: *Deleted

## 2023-03-20 DIAGNOSIS — Z7901 Long term (current) use of anticoagulants: Secondary | ICD-10-CM | POA: Diagnosis not present

## 2023-03-20 DIAGNOSIS — I824Y9 Acute embolism and thrombosis of unspecified deep veins of unspecified proximal lower extremity: Secondary | ICD-10-CM

## 2023-03-20 LAB — POCT INR: INR: 1.4 — AB (ref 2.0–3.0)

## 2023-03-20 NOTE — Patient Instructions (Signed)
Description   Today take 1.5 tablets and tomorrow take 2.5 tablets then continue taking warfarin 1 tablet (5mg ) daily, except 2 tablets (10mg ) on Friday.  Recheck INR in 1 week (normally 6 weeks). [facility] [phone] Procedure Clearance Fax [phone] or [phone]

## 2023-03-21 ENCOUNTER — Other Ambulatory Visit (HOSPITAL_COMMUNITY): Payer: Self-pay

## 2023-03-25 ENCOUNTER — Ambulatory Visit (INDEPENDENT_AMBULATORY_CARE_PROVIDER_SITE_OTHER): Payer: 59

## 2023-03-25 DIAGNOSIS — I428 Other cardiomyopathies: Secondary | ICD-10-CM | POA: Diagnosis not present

## 2023-03-26 ENCOUNTER — Other Ambulatory Visit (HOSPITAL_COMMUNITY): Payer: Self-pay

## 2023-03-26 LAB — CUP PACEART REMOTE DEVICE CHECK
Battery Remaining Longevity: 59 mo
Battery Remaining Percentage: 58 %
Battery Voltage: 2.96 V
Brady Statistic RV Percent Paced: 1 %
Date Time Interrogation Session: 20240625035240
HighPow Impedance: 64 Ohm
HighPow Impedance: 64 Ohm
Implantable Lead Connection Status: 753985
Implantable Lead Implant Date: 20190918
Implantable Lead Location: 753860
Implantable Lead Model: 7122
Implantable Pulse Generator Implant Date: 20190918
Lead Channel Impedance Value: 430 Ohm
Lead Channel Pacing Threshold Amplitude: 0.5 V
Lead Channel Pacing Threshold Pulse Width: 0.5 ms
Lead Channel Sensing Intrinsic Amplitude: 12 mV
Lead Channel Setting Pacing Amplitude: 2.5 V
Lead Channel Setting Pacing Pulse Width: 0.5 ms
Lead Channel Setting Sensing Sensitivity: 0.5 mV
Pulse Gen Serial Number: 9796641

## 2023-03-27 ENCOUNTER — Ambulatory Visit: Payer: 59 | Attending: Cardiology | Admitting: *Deleted

## 2023-03-27 DIAGNOSIS — I824Y9 Acute embolism and thrombosis of unspecified deep veins of unspecified proximal lower extremity: Secondary | ICD-10-CM | POA: Diagnosis not present

## 2023-03-27 DIAGNOSIS — Z7901 Long term (current) use of anticoagulants: Secondary | ICD-10-CM | POA: Diagnosis not present

## 2023-03-27 LAB — POCT INR: INR: 1.9 — AB (ref 2.0–3.0)

## 2023-03-27 NOTE — Patient Instructions (Signed)
Description   Today take another 1 tablet then START taking warfarin 1 tablet (5mg ) daily, except 2 tablets (10mg ) on Monday and Friday.  Recheck INR in 3 weeks (normally 6 weeks). [facility] [phone] Procedure Clearance Fax [phone] or [phone]

## 2023-03-31 ENCOUNTER — Ambulatory Visit (INDEPENDENT_AMBULATORY_CARE_PROVIDER_SITE_OTHER): Payer: 59 | Admitting: Podiatry

## 2023-03-31 ENCOUNTER — Encounter: Payer: Self-pay | Admitting: Podiatry

## 2023-03-31 DIAGNOSIS — M79674 Pain in right toe(s): Secondary | ICD-10-CM | POA: Diagnosis not present

## 2023-03-31 DIAGNOSIS — E119 Type 2 diabetes mellitus without complications: Secondary | ICD-10-CM

## 2023-03-31 DIAGNOSIS — M79675 Pain in left toe(s): Secondary | ICD-10-CM

## 2023-03-31 DIAGNOSIS — B351 Tinea unguium: Secondary | ICD-10-CM

## 2023-03-31 DIAGNOSIS — D689 Coagulation defect, unspecified: Secondary | ICD-10-CM

## 2023-03-31 NOTE — Progress Notes (Signed)
This patient returns to my office for at risk foot care.  This patient requires this care by a professional since this patient will be at risk due to having coagulation defect, diabetes and peripheral claudication.  Patient is taking coumadin.  This patient is unable to cut nails himself since the patient cannot reach his nails.These nails are painful walking and wearing shoes.  This patient presents for at risk foot care today.  General Appearance  Alert, conversant and in no acute stress.  Vascular  Dorsalis pedis and posterior tibial  pulses are weakly palpable  bilaterally.  Capillary return is within normal limits  bilaterally. Cold feet bilaterally. Absent digital hair.  Neurologic  Senn-Weinstein monofilament wire test within normal limits  bilaterally. Muscle power within normal limits bilaterally.  Nails Thick disfigured discolored nails with subungual debris  from hallux to fifth toes bilaterally. No evidence of bacterial infection or drainage bilaterally.  Orthopedic  No limitations of motion  feet .  No crepitus or effusions noted.  No bony pathology or digital deformities noted.  Skin  normotropic skin with no porokeratosis noted bilaterally.  No signs of infections or ulcers noted.     Onychomycosis  Pain in right toes  Pain in left toes  Consent was obtained for treatment procedures.   Mechanical debridement of nails 1-5  bilaterally performed with a nail nipper.  Filed with dremel without incident.    Return office visit   4 months                   Told patient to return for periodic foot care and evaluation due to potential at risk complications.   Tashi Band DPM  

## 2023-04-11 ENCOUNTER — Other Ambulatory Visit: Payer: Self-pay | Admitting: Cardiovascular Disease

## 2023-04-11 DIAGNOSIS — I824Y9 Acute embolism and thrombosis of unspecified deep veins of unspecified proximal lower extremity: Secondary | ICD-10-CM

## 2023-04-11 DIAGNOSIS — I2699 Other pulmonary embolism without acute cor pulmonale: Secondary | ICD-10-CM

## 2023-04-16 ENCOUNTER — Other Ambulatory Visit: Payer: Self-pay | Admitting: Nurse Practitioner

## 2023-04-16 DIAGNOSIS — R058 Other specified cough: Secondary | ICD-10-CM

## 2023-04-17 ENCOUNTER — Ambulatory Visit: Payer: 59 | Attending: Internal Medicine

## 2023-04-17 DIAGNOSIS — Z7901 Long term (current) use of anticoagulants: Secondary | ICD-10-CM

## 2023-04-17 DIAGNOSIS — I824Y9 Acute embolism and thrombosis of unspecified deep veins of unspecified proximal lower extremity: Secondary | ICD-10-CM

## 2023-04-17 LAB — POCT INR: INR: 1.7 — AB (ref 2.0–3.0)

## 2023-04-17 NOTE — Progress Notes (Signed)
Remote ICD transmission.   

## 2023-04-17 NOTE — Patient Instructions (Signed)
Description   Take an extra 1/2 tablet today and then START taking warfarin 1 tablet (5mg ) daily, except 2 tablets (10mg ) on Monday, Wednesday and Friday.  Recheck INR in 3 weeks (normally 6 weeks).  [facility] [phone] Procedure Clearance Fax [phone] or [phone]

## 2023-04-22 ENCOUNTER — Other Ambulatory Visit (HOSPITAL_COMMUNITY): Payer: Self-pay

## 2023-04-23 ENCOUNTER — Other Ambulatory Visit (HOSPITAL_COMMUNITY): Payer: Self-pay

## 2023-04-24 ENCOUNTER — Other Ambulatory Visit (INDEPENDENT_AMBULATORY_CARE_PROVIDER_SITE_OTHER): Payer: 59

## 2023-04-24 ENCOUNTER — Ambulatory Visit (INDEPENDENT_AMBULATORY_CARE_PROVIDER_SITE_OTHER): Payer: 59 | Admitting: Physician Assistant

## 2023-04-24 ENCOUNTER — Encounter: Payer: Self-pay | Admitting: Physician Assistant

## 2023-04-24 VITALS — BP 100/70 | HR 83 | Ht 66.0 in | Wt 172.0 lb

## 2023-04-24 DIAGNOSIS — Z7901 Long term (current) use of anticoagulants: Secondary | ICD-10-CM

## 2023-04-24 DIAGNOSIS — Z86718 Personal history of other venous thrombosis and embolism: Secondary | ICD-10-CM | POA: Diagnosis not present

## 2023-04-24 DIAGNOSIS — D649 Anemia, unspecified: Secondary | ICD-10-CM

## 2023-04-24 DIAGNOSIS — Z86010 Personal history of colonic polyps: Secondary | ICD-10-CM

## 2023-04-24 DIAGNOSIS — I5022 Chronic systolic (congestive) heart failure: Secondary | ICD-10-CM

## 2023-04-24 DIAGNOSIS — R931 Abnormal findings on diagnostic imaging of heart and coronary circulation: Secondary | ICD-10-CM

## 2023-04-24 DIAGNOSIS — Z860101 Personal history of adenomatous and serrated colon polyps: Secondary | ICD-10-CM

## 2023-04-24 LAB — CBC WITH DIFFERENTIAL/PLATELET
Basophils Absolute: 0.1 10*3/uL (ref 0.0–0.1)
Basophils Relative: 1.2 % (ref 0.0–3.0)
Eosinophils Absolute: 0.2 10*3/uL (ref 0.0–0.7)
Eosinophils Relative: 3.9 % (ref 0.0–5.0)
HCT: 39 % (ref 39.0–52.0)
Hemoglobin: 11.9 g/dL — ABNORMAL LOW (ref 13.0–17.0)
Lymphocytes Relative: 22.7 % (ref 12.0–46.0)
Lymphs Abs: 1.4 10*3/uL (ref 0.7–4.0)
MCHC: 30.5 g/dL (ref 30.0–36.0)
MCV: 75.9 fl — ABNORMAL LOW (ref 78.0–100.0)
Monocytes Absolute: 0.5 10*3/uL (ref 0.1–1.0)
Monocytes Relative: 8.3 % (ref 3.0–12.0)
Neutro Abs: 4 10*3/uL (ref 1.4–7.7)
Neutrophils Relative %: 63.9 % (ref 43.0–77.0)
Platelets: 211 10*3/uL (ref 150.0–400.0)
RBC: 5.14 Mil/uL (ref 4.22–5.81)
RDW: 19.7 % — ABNORMAL HIGH (ref 11.5–15.5)
WBC: 6.3 10*3/uL (ref 4.0–10.5)

## 2023-04-24 LAB — IBC + FERRITIN
Ferritin: 6.1 ng/mL — ABNORMAL LOW (ref 22.0–322.0)
Iron: 26 ug/dL — ABNORMAL LOW (ref 42–165)
Saturation Ratios: 5.8 % — ABNORMAL LOW (ref 20.0–50.0)
TIBC: 449.4 ug/dL (ref 250.0–450.0)
Transferrin: 321 mg/dL (ref 212.0–360.0)

## 2023-04-24 LAB — B12 AND FOLATE PANEL
Folate: 9.9 ng/mL (ref >5.9)
Vitamin B-12: 201 pg/mL — ABNORMAL LOW (ref 211–911)

## 2023-04-24 NOTE — Progress Notes (Signed)
Agree with assessment plan as outlined. 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  In this lady's case management of the hospital for anesthesia support. Agree he needs an EGD and colonoscopy for iron deficiency and history of colon polyps. Jonathan Dickson I am currently booking out 3 months to my next opening at the hospital.  [name] can you please contact the patient for scheduling at some point.  I have added him to the wait list, next available.  Thanks

## 2023-04-24 NOTE — Patient Instructions (Addendum)
Your provider has requested that you go to the basement level for lab work before leaving today. Press "B" on the elevator. The lab is located at the first door on the left as you exit the elevator.  Will call back to schedule after talking to Dr. Adela Lank nurse.  _______________________________________________________  If your blood pressure at your visit was 140/90 or greater, please contact your primary care physician to follow up on this.  _______________________________________________________  If you are age 59 or older, your body mass index should be between 23-30. Your Body mass index is 27.76 kg/m. If this is out of the aforementioned range listed, please consider follow up with your Primary Care Provider.  If you are age 51 or younger, your body mass index should be between 19-25. Your Body mass index is 27.76 kg/m. If this is out of the aformentioned range listed, please consider follow up with your Primary Care Provider.   ________________________________________________________  The [facility] providers would like to encourage you to use [facility] to communicate with providers for non-urgent requests or questions.  Due to long hold times on the telephone, sending your provider a message by [facility] may be a faster and more efficient way to get a response.  Please allow 48 business hours for a response.  Please remember that this is for non-urgent requests.  _______________________________________________________

## 2023-04-24 NOTE — Progress Notes (Signed)
Chief Complaint: Anemia  HPI:  [name] is a  67 y/o male with a past medical history as listed below including blood clotting disorder, status post defibrillator, CHF (LVEF 30-35% 05/09/2021), diabetes, history of DVT and multiple others on Coumadin, who was referred to me by Claiborne Rigg, NP for a complaint of anemia.    05/09/2016 colonoscopy with one 8 mm polyp in the ascending colon, diverticulosis in the entire colon and otherwise normal. Pathology showed tubular adenoma and repeat recommended in 7 years.     03/28/2022 patient seen in clinic by me to discuss repeat colonoscopy given his history of adenomatous polyps.  That time his surveillance was extended to 7 years from his last exam and told he was due in August 2024.    02/05/2023 CMP normal.  CBC with a hemoglobin of 12.6 (12.9 10/07/2022, 12.9 04/08/2022, 14.2 on 08/17/2021).  MCV normal.  MCH decreased to 23.4.    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  They have been talking about another surgery.  He would like to have this done before he has any workup from GI.  He has been told that he has a low hemoglobin and that he needs further workup.  Also aware that he is overdue for a colonoscopy for his history of colon polyps.  Denies any acute GI symptoms.    Patient takes cakes on a regular basis 5-7 a week.  His specialty is a banana pudding pound cake.    Denies fever, chills, change in bowel habits, abdominal pain or weight loss.  Past Medical History:  Diagnosis Date   Abnormal TSH 02/2016   AICD (automatic cardioverter/defibrillator) present    Anxiety    Blood clotting disorder (HCC)    Bronchiectasis (HCC)    left lower lung   Cardiac amyloidosis (HCC)    Cervical disc disease    Chronic combined systolic and diastolic CHF (congestive heart  failure) (HCC)    a. 01/2016 Echo: EF 25%, inf AK, diffuse sev HK, Gr1 DD, mild MR.   Coronary atherosclerosis    Depression    Diabetes mellitus without complication (HCC)    Dyspnea    Dysrhythmia    Full thickness rotator cuff tear 06/2021   MRI   History of DVT (deep vein thrombosis) 08/04/2019   venous doppler US   History of hiatal hernia    Hx of adenomatous polyp of colon    Hyperglycemia    Hyperlipidemia    Hypertension    Hypertensive heart disease    Myocardial infarction [facility])    ??  maybe   NICM (nonischemic cardiomyopathy) (HCC)    a. 01/2016 Echo: EF 25%, inf AK, diffuse sev HK, Gr1 DD, mild MR; b. 01/2016 MV: EF 22%, no isch/infarct;  c. 02/2016 Cath: Nl cors, EF 35-45%.   Osteoarthritis of hips, bilateral    Paraseptal emphysema (HCC)    mild   Peanut allergy    Pneumonia    Pre-diabetes    Presence of implantable cardioverter-defibrillator (ICD)    S/P repair of ventral hernia    Seizures (HCC)    with penicillin   Sensorineural hearing loss     Past Surgical History:  Procedure Laterality Date   ANTERIOR CERVICAL DECOMP/DISCECTOMY FUSION N/A 07/10/2016   Procedure: ANTERIOR CERVICAL DECOMPRESSION  FUSION CERVICAL 4-5, CERVICAL 5-6, CERVICAL 6-7 WITH INSTRUMENTATION AND ALLOGRAFT;  Surgeon: Estill Bamberg, MD;  Location: [facility] OR;  Service: Orthopedics;  Laterality: N/A;   ANTERIOR CERVICAL DECOMP/DISCECTOMY FUSION N/A 04/19/2021   Procedure: ANTERIOR CERVICAL DECOMPRESSION FUSION CERVICAL 3- CERVICAL 4 WITH INSTRUMENTATION AND ALLOGRAFT;  Surgeon: Estill Bamberg, MD;  Location: [facility] OR;  Service: Orthopedics;  Laterality: N/A;   CARDIAC CATHETERIZATION N/A 03/11/2016   Procedure: Left Heart Cath and Coronary Angiography;  Surgeon: Marykay Lex, MD;  Location: [facility] INVASIVE CV LAB;  Service: Cardiovascular;  Laterality: N/A;   COLONOSCOPY     ICD IMPLANT N/A 06/17/2018   Procedure: ICD IMPLANT;  Surgeon: Regan Lemming, MD;  Location: [facility] INVASIVE CV LAB;   Service: Cardiovascular;  Laterality: N/A;   Thumb surgery Right    VENTRAL HERNIA REPAIR N/A 10/25/2019   Procedure: PRIMARY VENTRAL HERNIA REPAIR;  Surgeon: Berna Bue, MD;  Location: [facility] ORS;  Service: General;  Laterality: N/A;    Current Outpatient Medications  Medication Sig Dispense Refill   acetaminophen (TYLENOL) 500 MG tablet Take 1,000 mg by mouth every 6 (six) hours as needed for moderate pain or headache.     albuterol (VENTOLIN HFA) 108 (90 Base) MCG/ACT inhaler INHALE 2 PUFFS INTO THE LUNGS EVERY 6 HOURS AS NEEDED FOR WHEEZING OR SHORTNESS OF BREATH 6.7 g 3   Aromatic Inhalants (VICKS VAPOINHALER) INHA Inhale 1 Dose into the lungs daily as needed (congestion).     atorvastatin (LIPITOR) 80 MG tablet Take 1 tablet (80 mg total) by mouth daily. 90 tablet 3   carvedilol (COREG) 12.5 MG tablet Take 1 tablet (12.5 mg total) by mouth 2 (two) times daily with a meal. 60 tablet 8   Chlorphen-Phenyleph-ASA (ALKA-SELTZER PLUS COLD PO) Take 1-2 tablets by mouth 2 (two) times daily as needed (cold symptoms).      cholecalciferol (VITAMIN D3) 25 MCG (1000 UNIT) tablet Take by mouth.     cyclobenzaprine (FLEXERIL) 5 MG tablet Take 1 tablet (5 mg total) by mouth 3 (three) times daily as needed for muscle spasms. 30 tablet 1   dapagliflozin propanediol (FARXIGA) 10 MG TABS tablet Take 1 tablet (10 mg total) by mouth daily. 30 tablet 6   diclofenac Sodium (VOLTAREN) 1 % GEL Apply 2 g topically 4 (four) times daily as needed (pain).     fluticasone (FLONASE) 50 MCG/ACT nasal spray SHAKE LIQUID AND USE 1 SPRAY IN EACH NOSTRIL DAILY 16 g 1   loratadine (CLARITIN) 10 MG tablet Take 1 tablet (10 mg total) by mouth daily. 90 tablet 3   metFORMIN (GLUCOPHAGE) 500 MG tablet Take 1 tablet (500 mg total) by mouth daily with breakfast. 90 tablet 1   methocarbamol (ROBAXIN) 500 MG tablet Take 1 tablet (500 mg total) by mouth every 6 (six) hours as needed for muscle spasms. 60 tablet 0   Misc. Devices  MISC by Does not apply route. Flutter valve daily     sacubitril-valsartan (ENTRESTO) 49-51 MG Take 1 tablet by mouth 2 (two) times daily. 60 tablet 8   spironolactone (ALDACTONE) 25 MG tablet Take 0.5 tablets (12.5 mg total) by mouth daily. TAKE 1/2 TABLET(12.5 MG) BY MOUTH DAILY 15 tablet 11   Tafamidis (VYNDAMAX) 61 MG CAPS Take 1 capsule by mouth daily. 30 capsule 11   warfarin (COUMADIN) 5 MG tablet TAKE 1 TO 2 TABLETS BY MOUTH DAILY AS DIRECTED BY COUMADIN CLINIC 150 tablet 1   No current facility-administered medications for this visit.  Allergies as of 04/24/2023 - Review Complete 04/24/2023  Allergen Reaction Noted   Peanut-containing drug products Swelling 09/27/2014   Penicillins Other (See Comments) 10/22/2012   Decadron [dexamethasone] Itching 07/12/2016   Ivp dye [iodinated contrast media]  01/29/2022    Family History  Problem Relation Age of Onset   Diabetes Mother    Diabetes Sister    Clotting disorder Neg Hx    Lung disease Neg Hx    Colon cancer Neg Hx    Esophageal cancer Neg Hx    Stomach cancer Neg Hx    Pancreatic cancer Neg Hx     Social History   Socioeconomic History   Marital status: Legally Separated    Spouse name: Not on file   Number of children: 3   Years of education: Not on file   Highest education level: 10th grade  Occupational History   Not on file  Tobacco Use   Smoking status: Former    Current packs/day: 0.00    Average packs/day: 1.5 packs/day for 18.0 years (27.0 ttl pk-yrs)    Types: Cigarettes    Start date: 03/14/1985    Quit date: 03/15/2003    Years since quitting: 20.1   Smokeless tobacco: Never  Vaping Use   Vaping status: Never Used  Substance and Sexual Activity   Alcohol use: No    Comment: none since 2004   Drug use: No    Comment: former  none since 2004   Sexual activity: Not Currently  Other Topics Concern   Not on file  Social History Narrative   Not on file   Social Determinants of Health    Financial Resource Strain: Low Risk  (02/01/2023)   Overall Financial Resource Strain (CARDIA)    Difficulty of Paying Living Expenses: Not very hard  Food Insecurity: No Food Insecurity (02/01/2023)   Hunger Vital Sign    Worried About Running Out of Food in the Last Year: Never true    Ran Out of Food in the Last Year: Never true  Transportation Needs: No Transportation Needs (02/01/2023)   PRAPARE - Administrator, Civil Service (Medical): No    Lack of Transportation (Non-Medical): No  Physical Activity: Inactive (02/01/2023)   Exercise Vital Sign    Days of Exercise per Week: 0 days    Minutes of Exercise per Session: 30 min  Stress: No Stress Concern Present (02/01/2023)   [employer] of Occupational Health - Occupational Stress Questionnaire    Feeling of Stress : Not at all  Social Connections: Moderately Isolated (02/01/2023)   Social Connection and Isolation Panel [NHANES]    Frequency of Communication with Friends and Family: Twice a week    Frequency of Social Gatherings with Friends and Family: Once a week    Attends Religious Services: 1 to 4 times per year    Active Member of [employer] or Organizations: No    Attends Banker Meetings: Not on file    Marital Status: Widowed  Catering manager Violence: Not on file    Review of Systems:    Constitutional: No weight loss, fever or chills Cardiovascular: No chest pain Respiratory: No SOB Gastrointestinal: See HPI and otherwise negative   Physical Exam:  Vital signs: BP 100/70   Pulse 83   Ht 5\' 6"  (1.676 m)   Wt 172 lb (78 kg)   BMI 27.76 kg/m   Constitutional:   Pleasant AA male appears to be in NAD, Well developed, Well  nourished, alert and cooperative Head:  Normocephalic and atraumatic. Eyes:   PEERL, EOMI. No icterus. Conjunctiva pink. Ears:  Normal auditory acuity. Neck:  Supple Throat: Oral cavity and pharynx without inflammation, swelling or lesion.  Respiratory: Respirations even  and unlabored. Lungs clear to auscultation bilaterally.   No wheezes, crackles, or rhonchi.  Cardiovascular: Normal S1, S2. No MRG. Regular rate and rhythm. No peripheral edema, cyanosis or pallor.  Gastrointestinal:  Soft, nondistended, nontender. No rebound or guarding. Normal bowel sounds. No appreciable masses or hepatomegaly. Rectal:  Not performed.  Msk:  Symmetrical without gross deformities. Without edema, no deformity or joint abnormality.  Neurologic:  Alert and  oriented x4;  grossly normal neurologically.  Skin:   Dry and intact without significant lesions or rashes. Psychiatric: Demonstrates good judgement and reason without abnormal affect or behaviors.  RELEVANT LABS AND IMAGING: CBC    Component Value Date/Time   WBC 6.3 02/05/2023 0931   WBC 6.8 08/17/2021 1803   RBC 5.39 02/05/2023 0931   RBC 5.63 08/17/2021 1803   HGB 12.6 (L) 02/05/2023 0931   HCT 42.3 02/05/2023 0931   PLT 235 02/05/2023 0931   MCV 79 02/05/2023 0931   MCH 23.4 (L) 02/05/2023 0931   MCH 25.2 (L) 08/17/2021 1803   MCHC 29.8 (L) 02/05/2023 0931   MCHC 31.3 08/17/2021 1803   RDW 17.3 (H) 02/05/2023 0931   LYMPHSABS 2.3 02/05/2023 0931   MONOABS 0.5 08/17/2021 1803   EOSABS 0.2 02/05/2023 0931   BASOSABS 0.1 02/05/2023 0931    CMP     Component Value Date/Time   NA 139 02/05/2023 0931   K 4.9 02/05/2023 0931   CL 104 02/05/2023 0931   CO2 16 (L) 02/05/2023 0931   GLUCOSE 92 02/05/2023 0931   GLUCOSE 94 08/17/2021 1803   BUN 20 02/05/2023 0931   CREATININE 1.22 02/05/2023 0931   CREATININE 0.98 03/21/2016 0953   CALCIUM 9.9 02/05/2023 0931   PROT 7.1 02/05/2023 0931   ALBUMIN 4.5 02/05/2023 0931   AST 29 02/05/2023 0931   ALT 21 02/05/2023 0931   ALKPHOS 76 02/05/2023 0931   BILITOT 0.3 02/05/2023 0931   GFRNONAA >60 08/17/2021 1803   GFRNONAA >89 10/04/2015 1708   GFRAA 62 07/21/2020 1235   GFRAA >89 10/04/2015 1708    Assessment: 1.  Low hemoglobin: Actually stable over the  past year, no iron studies, Hemoccult testing or other evaluations so far, patient due for surveillance colonoscopy as well for history of adenomatous polyps; consider anemia of chronic disease versus GI source versus other 2.  CHF with decreased EF: Last echo in 2022 with EF 35% 3.  Chronic anticoagulation: For history of DVT on Coumadin  Plan: 1.  Patient will need EGD and colonoscopy at the hospital given decreased ejection fraction and new anemia.  This will be scheduled with Dr. Adela Lank as his schedule allows.  Did discuss risk on benefits, notations and alternatives with the patient and he agrees to proceed. 2.  Patient will need to hold his Coumadin for 7 days prior to time of procedure.  We will communicate this with apposition to ensure this is acceptable to him. 3.  Patient is hoping to have neck surgery again soon and he hopes prior to his EGD and colonoscopy, told him to let us know how the appointment goes tomorrow and when this is scheduled. 4.  Ordered labs today to include CBC, iron studies with ferritin and B12/folate 5.  Patient to follow in  clinic per recommendations after procedures above.  Hyacinth Meeker, PA-C [facility] 04/24/2023, 9:42 AM  Cc: Claiborne Rigg, NP

## 2023-04-25 ENCOUNTER — Telehealth: Payer: Self-pay | Admitting: *Deleted

## 2023-04-25 ENCOUNTER — Other Ambulatory Visit: Payer: Self-pay | Admitting: *Deleted

## 2023-04-25 DIAGNOSIS — D649 Anemia, unspecified: Secondary | ICD-10-CM

## 2023-04-25 NOTE — Telephone Encounter (Signed)
-----   Message from [facility] Marylu Lund H sent at 04/24/2023  5:37 PM EDT ----- Regarding: ECL at Hospital with [facility], You can schedule this patient's double on Oct 28th at 9:45am.    Thanks, [name]

## 2023-04-25 NOTE — Telephone Encounter (Signed)
Left message for patient to call office to offer hospital date 07/28/23

## 2023-04-28 ENCOUNTER — Telehealth: Payer: Self-pay | Admitting: *Deleted

## 2023-04-28 ENCOUNTER — Encounter: Payer: Self-pay | Admitting: Gastroenterology

## 2023-04-28 NOTE — Telephone Encounter (Signed)
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    Procedure: C7-T1 CERVICAL INTERLAMINAR EPIDURAL STEROID INJECTION  Date of procedure: TBD  PE 2019, DVT 2020  CrCl 47 ml/min Platelet count 211  Per office protocol, patient can hold warfarin for 5 days prior to procedure.    Patient WILL need bridging with Lovenox (enoxaparin) around procedure.  **This guidance is not considered finalized until pre-operative APP has relayed final recommendations.**  She is managed in the Coumadin clinic. She will need to let them know when the procedure is scheduled so that her bridge can be coordinated.

## 2023-04-28 NOTE — Telephone Encounter (Signed)
Pharmacy please advise on holding warfarin prior to cervical ESI scheduled for TBD. Thank you.

## 2023-04-28 NOTE — Telephone Encounter (Signed)
   Name: Jonathan Dickson  DOB: Jul 03, 1956  MRN: [id number]  Primary Cardiologist: Nanetta Batty, MD   Preoperative team, please contact this patient and set up a phone call appointment for further preoperative risk assessment. Please obtain consent and complete medication review. Thank you for your help.  I confirm that guidance regarding antiplatelet and oral anticoagulation therapy has been completed and, if necessary, noted below.  Per office protocol, patient can hold warfarin for 5 days prior to procedure.    Patient WILL need bridging with Lovenox (enoxaparin) around procedure.   Napoleon Form, Leodis Rains, NP 04/28/2023, 4:11 PM [facility]

## 2023-04-28 NOTE — Telephone Encounter (Signed)
   Pre-operative Risk Assessment    Patient Name: Jonathan Dickson  DOB: 08/25/1956 MRN: [id number]      Request for Surgical Clearance    Procedure:   C7-T1 CERVICAL INTERLAMINAR EPIDURAL STEROID INJECTION  Date of Surgery:  Clearance TBD                                 Surgeon: DR. Claria Dice  Surgeon's Group or Practice Name:  [facility] Phone number:  [phone] ATTN: Barbara Cower Fax number:  [phone]   Type of Clearance Requested:   - Medical  - Pharmacy:  Hold Warfarin (Coumadin) x 5 DAYS PRIOR TO PROCEDURE, ALSO NEED INR CHECKED PRIOR TO INJECTION   Type of Anesthesia:  Local    Additional requests/questions:    Elpidio Anis   04/28/2023, 3:20 PM

## 2023-04-29 ENCOUNTER — Ambulatory Visit (HOSPITAL_COMMUNITY): Payer: 59

## 2023-04-29 ENCOUNTER — Encounter (HOSPITAL_COMMUNITY): Payer: Self-pay

## 2023-04-29 ENCOUNTER — Telehealth: Payer: Self-pay | Admitting: Cardiovascular Disease

## 2023-04-29 ENCOUNTER — Telehealth: Payer: Self-pay | Admitting: *Deleted

## 2023-04-29 NOTE — Telephone Encounter (Signed)
I s/w the pt and he is agreeable to plan of care for tele pre op appt. Tele pre op appt 05/09/23. Pt has coumadin appt 05/08/23 which they will go over recommendations for Lovenox bridge for pre op.   Med rec and consent are done.

## 2023-04-29 NOTE — Telephone Encounter (Signed)
Patient is returning phone call.  °

## 2023-04-29 NOTE — Telephone Encounter (Signed)
I s/w the pt and he is agreeable to plan of care for tele pre op appt. Tele pre op appt 05/09/23. Pt has coumadin appt 05/08/23 which they will go over recommendations for Lovenox bridge for pre op.   Med rec and consent are done.      Patient Consent for Virtual Visit        Jonathan Dickson has provided verbal consent on 04/29/2023 for a virtual visit (video or telephone).   CONSENT FOR VIRTUAL VISIT FOR:  Jonathan Dickson  By participating in this virtual visit I agree to the following:  I hereby voluntarily request, consent and authorize [facility] and its employed or contracted physicians, physician assistants, nurse practitioners or other licensed health care professionals (the Practitioner), to provide me with telemedicine health care services (the "Services") as deemed necessary by the treating Practitioner. I acknowledge and consent to receive the Services by the Practitioner via telemedicine. I understand that the telemedicine visit will involve communicating with the Practitioner through live audiovisual communication technology and the disclosure of certain medical information by electronic transmission. I acknowledge that I have been given the opportunity to request an in-person assessment or other available alternative prior to the telemedicine visit and am voluntarily participating in the telemedicine visit.  I understand that I have the right to withhold or withdraw my consent to the use of telemedicine in the course of my care at any time, without affecting my right to future care or treatment, and that the Practitioner or I may terminate the telemedicine visit at any time. I understand that I have the right to inspect all information obtained and/or recorded in the course of the telemedicine visit and may receive copies of available information for a reasonable fee.  I understand that some of the potential risks of receiving the Services via telemedicine include:  Delay or  interruption in medical evaluation due to technological equipment failure or disruption; Information transmitted may not be sufficient (e.g. poor resolution of images) to allow for appropriate medical decision making by the Practitioner; and/or  In rare instances, security protocols could fail, causing a breach of personal health information.  Furthermore, I acknowledge that it is my responsibility to provide information about my medical history, conditions and care that is complete and accurate to the best of my ability. I acknowledge that Practitioner's advice, recommendations, and/or decision may be based on factors not within their control, such as incomplete or inaccurate data provided by me or distortions of diagnostic images or specimens that may result from electronic transmissions. I understand that the practice of medicine is not an exact science and that Practitioner makes no warranties or guarantees regarding treatment outcomes. I acknowledge that a copy of this consent can be made available to me via my patient portal [facility] MyChart), or I can request a printed copy by calling the office of [facility].    I understand that my insurance will be billed for this visit.   I have read or had this consent read to me. I understand the contents of this consent, which adequately explains the benefits and risks of the Services being provided via telemedicine.  I have been provided ample opportunity to ask questions regarding this consent and the Services and have had my questions answered to my satisfaction. I give my informed consent for the services to be provided through the use of telemedicine in my medical care

## 2023-04-29 NOTE — Telephone Encounter (Signed)
1st attempt to reach pt regarding surgical clearance and the need for a tele visit.  Left a message for pt to call back and ask for the preop team. 

## 2023-04-30 NOTE — Telephone Encounter (Signed)
MyChart message sent to patient regarding 10-28 at [facility]

## 2023-05-01 NOTE — Telephone Encounter (Signed)
Patient is available on October 28th.  He has been made a PV for 10-14. (See MyChart message).

## 2023-05-02 ENCOUNTER — Other Ambulatory Visit: Payer: Self-pay | Admitting: Adult Health

## 2023-05-02 ENCOUNTER — Other Ambulatory Visit: Payer: Self-pay

## 2023-05-02 DIAGNOSIS — D649 Anemia, unspecified: Secondary | ICD-10-CM

## 2023-05-02 DIAGNOSIS — Z86010 Personal history of colonic polyps: Secondary | ICD-10-CM

## 2023-05-02 DIAGNOSIS — J479 Bronchiectasis, uncomplicated: Secondary | ICD-10-CM

## 2023-05-02 NOTE — Telephone Encounter (Signed)
Scheduled ECL at [facility] on 07/28/2023 at 10:45 per Penni Bombard.

## 2023-05-05 ENCOUNTER — Emergency Department (HOSPITAL_COMMUNITY)
Admission: EM | Admit: 2023-05-05 | Discharge: 2023-05-05 | Disposition: A | Discharge status: Home / Self Care | Payer: 59 | Attending: Emergency Medicine | Admitting: Emergency Medicine

## 2023-05-05 ENCOUNTER — Other Ambulatory Visit: Payer: Self-pay

## 2023-05-05 ENCOUNTER — Encounter (HOSPITAL_COMMUNITY): Payer: Self-pay | Admitting: Emergency Medicine

## 2023-05-05 ENCOUNTER — Emergency Department (HOSPITAL_COMMUNITY): Payer: 59

## 2023-05-05 DIAGNOSIS — Z91010 Allergy to peanuts: Secondary | ICD-10-CM | POA: Diagnosis not present

## 2023-05-05 DIAGNOSIS — M542 Cervicalgia: Secondary | ICD-10-CM | POA: Insufficient documentation

## 2023-05-05 DIAGNOSIS — I5042 Chronic combined systolic (congestive) and diastolic (congestive) heart failure: Secondary | ICD-10-CM | POA: Insufficient documentation

## 2023-05-05 DIAGNOSIS — Z87891 Personal history of nicotine dependence: Secondary | ICD-10-CM | POA: Diagnosis not present

## 2023-05-05 DIAGNOSIS — I4891 Unspecified atrial fibrillation: Secondary | ICD-10-CM | POA: Diagnosis not present

## 2023-05-05 DIAGNOSIS — E119 Type 2 diabetes mellitus without complications: Secondary | ICD-10-CM | POA: Insufficient documentation

## 2023-05-05 DIAGNOSIS — I110 Hypertensive heart disease with heart failure: Secondary | ICD-10-CM | POA: Insufficient documentation

## 2023-05-05 LAB — I-STAT CHEM 8, ED
BUN: 21 mg/dL (ref 8–23)
Calcium, Ion: 1.04 mmol/L — ABNORMAL LOW (ref 1.15–1.40)
Chloride: 114 mmol/L — ABNORMAL HIGH (ref 98–111)
Creatinine, Ser: 1.5 mg/dL — ABNORMAL HIGH (ref 0.61–1.24)
Glucose, Bld: 115 mg/dL — ABNORMAL HIGH (ref 70–99)
HCT: 39 % (ref 39.0–52.0)
Hemoglobin: 13.3 g/dL (ref 13.0–17.0)
Potassium: 4.5 mmol/L (ref 3.5–5.1)
Sodium: 137 mmol/L (ref 135–145)
TCO2: 15 mmol/L — ABNORMAL LOW (ref 22–32)

## 2023-05-05 MED ORDER — METHOCARBAMOL 500 MG PO TABS: 500.0000 mg | ORAL_TABLET | Freq: Three times a day (TID) | ORAL | 0 refills | Status: DC | PRN

## 2023-05-05 MED ORDER — METHYLPREDNISOLONE SODIUM SUCC 40 MG IJ SOLR
40.0000 mg | Freq: Once | INTRAMUSCULAR | Status: AC
  Administered 2023-05-05: 40 mg via INTRAVENOUS
  Filled 2023-05-05: qty 1

## 2023-05-05 MED ORDER — DIPHENHYDRAMINE HCL 25 MG PO CAPS: 50.0000 mg | ORAL_CAPSULE | Freq: Once | ORAL | Status: DC

## 2023-05-05 MED ORDER — OXYCODONE HCL 5 MG PO TABS
5.0000 mg | ORAL_TABLET | Freq: Once | ORAL | Status: AC
  Administered 2023-05-05: 5 mg via ORAL
  Filled 2023-05-05: qty 1

## 2023-05-05 MED ORDER — IOHEXOL 350 MG/ML SOLN
75.0000 mL | Freq: Once | INTRAVENOUS | Status: AC | PRN
  Administered 2023-05-05: 75 mL via INTRAVENOUS

## 2023-05-05 MED ORDER — DIPHENHYDRAMINE HCL 50 MG/ML IJ SOLN: 50.0000 mg | Freq: Once | INTRAMUSCULAR | Status: DC

## 2023-05-05 MED ORDER — LIDOCAINE 5 % EX PTCH: 1.0000 | MEDICATED_PATCH | CUTANEOUS | 0 refills | Status: DC

## 2023-05-05 NOTE — Progress Notes (Unsigned)
Subjective:   Jonathan Dickson is a 67 y.o. male who presents for Medicare Annual/Subsequent preventive examination.  Visit Complete: {[phone]}  Patient Medicare AWV questionnaire was completed by the patient on ***; I have confirmed that all information answered by patient is correct and no changes since this date.  Review of Systems    ***       Objective:    There were no vitals filed for this visit. There is no height or weight on file to calculate BMI.     05/05/2023    1:16 AM 12/07/2022    2:47 PM 09/13/2022    3:47 PM 11/06/2021    6:29 AM 04/19/2021   10:05 AM 04/10/2021    8:30 AM 04/01/2021   10:21 AM  Advanced Directives  Does Patient Have a Medical Advance Directive? Yes No No No No No No  Type of Estate agent of [place];Living will        Copy of Healthcare Power of Attorney in Chart? No - copy available, Physician notified        Would patient like information on creating a medical advance directive?   Yes (Inpatient - patient defers creating a medical advance directive at this time - Information given)  No - Patient declined No - Patient declined     Current Medications (verified) Outpatient Encounter Medications as of 05/06/2023  Medication Sig   acetaminophen (TYLENOL) 500 MG tablet Take 1,000 mg by mouth every 6 (six) hours as needed for moderate pain or headache.   albuterol (VENTOLIN HFA) 108 (90 Base) MCG/ACT inhaler INHALE 2 PUFFS INTO THE LUNGS EVERY 6 HOURS AS NEEDED FOR WHEEZING OR SHORTNESS OF BREATH   Aromatic Inhalants (VICKS VAPOINHALER) INHA Inhale 1 Dose into the lungs daily as needed (congestion).   atorvastatin (LIPITOR) 80 MG tablet Take 1 tablet (80 mg total) by mouth daily.   carvedilol (COREG) 12.5 MG tablet Take 1 tablet (12.5 mg total) by mouth 2 (two) times daily with a meal.   Chlorphen-Phenyleph-ASA (ALKA-SELTZER PLUS COLD PO) Take 1-2 tablets by mouth 2 (two) times daily as needed (cold symptoms).     cholecalciferol (VITAMIN D3) 25 MCG (1000 UNIT) tablet Take by mouth.   cyclobenzaprine (FLEXERIL) 5 MG tablet Take 1 tablet (5 mg total) by mouth 3 (three) times daily as needed for muscle spasms.   dapagliflozin propanediol (FARXIGA) 10 MG TABS tablet Take 1 tablet (10 mg total) by mouth daily.   diclofenac Sodium (VOLTAREN) 1 % GEL Apply 2 g topically 4 (four) times daily as needed (pain).   fluticasone (FLONASE) 50 MCG/ACT nasal spray SHAKE LIQUID AND USE 1 SPRAY IN EACH NOSTRIL DAILY   lidocaine (LIDODERM) 5 % Place 1 patch onto the skin daily. Remove & Discard patch within 12 hours or as directed by MD   loratadine (CLARITIN) 10 MG tablet Take 1 tablet (10 mg total) by mouth daily.   metFORMIN (GLUCOPHAGE) 500 MG tablet Take 1 tablet (500 mg total) by mouth daily with breakfast.   methocarbamol (ROBAXIN) 500 MG tablet Take 1 tablet (500 mg total) by mouth every 8 (eight) hours as needed for muscle spasms.   Misc. Devices MISC by Does not apply route. Flutter valve daily   sacubitril-valsartan (ENTRESTO) 49-51 MG Take 1 tablet by mouth 2 (two) times daily.   spironolactone (ALDACTONE) 25 MG tablet Take 0.5 tablets (12.5 mg total) by mouth daily. TAKE 1/2 TABLET(12.5 MG) BY MOUTH DAILY   Tafamidis (VYNDAMAX) 61  MG CAPS Take 1 capsule by mouth daily.   warfarin (COUMADIN) 5 MG tablet TAKE 1 TO 2 TABLETS BY MOUTH DAILY AS DIRECTED BY COUMADIN CLINIC   No facility-administered encounter medications on file as of 05/06/2023.    Allergies (verified) Peanut-containing drug products, Penicillins, Decadron [dexamethasone], and Ivp dye [iodinated contrast media]   History: Past Medical History:  Diagnosis Date   Abnormal TSH 02/2016   AICD (automatic cardioverter/defibrillator) present    Anxiety    Blood clotting disorder (HCC)    Bronchiectasis (HCC)    left lower lung   Cardiac amyloidosis (HCC)    Cervical disc disease    Chronic combined systolic and diastolic CHF (congestive heart  failure) (HCC)    a. 01/2016 Echo: EF 25%, inf AK, diffuse sev HK, Gr1 DD, mild MR.   Coronary atherosclerosis    Depression    Diabetes mellitus without complication (HCC)    Dyspnea    Dysrhythmia    Full thickness rotator cuff tear 06/2021   MRI   History of DVT (deep vein thrombosis) 08/04/2019   venous doppler US   History of hiatal hernia    Hx of adenomatous polyp of colon    Hyperglycemia    Hyperlipidemia    Hypertension    Hypertensive heart disease    Myocardial infarction [facility])    ??  maybe   NICM (nonischemic cardiomyopathy) (HCC)    a. 01/2016 Echo: EF 25%, inf AK, diffuse sev HK, Gr1 DD, mild MR; b. 01/2016 MV: EF 22%, no isch/infarct;  c. 02/2016 Cath: Nl cors, EF 35-45%.   Osteoarthritis of hips, bilateral    Paraseptal emphysema (HCC)    mild   Peanut allergy    Pneumonia    Pre-diabetes    Presence of implantable cardioverter-defibrillator (ICD)    S/P repair of ventral hernia    Seizures (HCC)    with penicillin   Sensorineural hearing loss    Past Surgical History:  Procedure Laterality Date   ANTERIOR CERVICAL DECOMP/DISCECTOMY FUSION N/A 07/10/2016   Procedure: ANTERIOR CERVICAL DECOMPRESSION FUSION CERVICAL 4-5, CERVICAL 5-6, CERVICAL 6-7 WITH INSTRUMENTATION AND ALLOGRAFT;  Surgeon: Estill Bamberg, MD;  Location: [facility] OR;  Service: Orthopedics;  Laterality: N/A;   ANTERIOR CERVICAL DECOMP/DISCECTOMY FUSION N/A 04/19/2021   Procedure: ANTERIOR CERVICAL DECOMPRESSION FUSION CERVICAL 3- CERVICAL 4 WITH INSTRUMENTATION AND ALLOGRAFT;  Surgeon: Estill Bamberg, MD;  Location: [facility] OR;  Service: Orthopedics;  Laterality: N/A;   CARDIAC CATHETERIZATION N/A 03/11/2016   Procedure: Left Heart Cath and Coronary Angiography;  Surgeon: Marykay Lex, MD;  Location: [facility] INVASIVE CV LAB;  Service: Cardiovascular;  Laterality: N/A;   COLONOSCOPY     ICD IMPLANT N/A 06/17/2018   Procedure: ICD IMPLANT;  Surgeon: Regan Lemming, MD;  Location: [facility] INVASIVE CV LAB;   Service: Cardiovascular;  Laterality: N/A;   Thumb surgery Right    VENTRAL HERNIA REPAIR N/A 10/25/2019   Procedure: PRIMARY VENTRAL HERNIA REPAIR;  Surgeon: Berna Bue, MD;  Location: [facility] ORS;  Service: General;  Laterality: N/A;   Family History  Problem Relation Age of Onset   Diabetes Mother    Diabetes Sister    Clotting disorder Neg Hx    Lung disease Neg Hx    Colon cancer Neg Hx    Esophageal cancer Neg Hx    Stomach cancer Neg Hx    Pancreatic cancer Neg Hx    Social History   Socioeconomic History   Marital status: Legally Separated  Spouse name: Not on file   Number of children: 3   Years of education: Not on file   Highest education level: 10th grade  Occupational History   Not on file  Tobacco Use   Smoking status: Former    Current packs/day: 0.00    Average packs/day: 1.5 packs/day for 18.0 years (27.0 ttl pk-yrs)    Types: Cigarettes    Start date: 03/14/1985    Quit date: 03/15/2003    Years since quitting: 20.1   Smokeless tobacco: Never  Vaping Use   Vaping status: Never Used  Substance and Sexual Activity   Alcohol use: No    Comment: none since 2004   Drug use: No    Comment: former  none since 2004   Sexual activity: Not Currently  Other Topics Concern   Not on file  Social History Narrative   Not on file   Social Determinants of Health   Financial Resource Strain: Low Risk  (02/01/2023)   Overall Financial Resource Strain (CARDIA)    Difficulty of Paying Living Expenses: Not very hard  Food Insecurity: No Food Insecurity (02/01/2023)   Hunger Vital Sign    Worried About Running Out of Food in the Last Year: Never true    Ran Out of Food in the Last Year: Never true  Transportation Needs: No Transportation Needs (02/01/2023)   PRAPARE - Administrator, Civil Service (Medical): No    Lack of Transportation (Non-Medical): No  Physical Activity: Inactive (02/01/2023)   Exercise Vital Sign    Days of Exercise per Week: 0 days     Minutes of Exercise per Session: 30 min  Stress: No Stress Concern Present (02/01/2023)   [employer] of Occupational Health - Occupational Stress Questionnaire    Feeling of Stress : Not at all  Social Connections: Moderately Isolated (02/01/2023)   Social Connection and Isolation Panel [NHANES]    Frequency of Communication with Friends and Family: Twice a week    Frequency of Social Gatherings with Friends and Family: Once a week    Attends Religious Services: 1 to 4 times per year    Active Member of [employer] or Organizations: No    Attends Banker Meetings: Not on file    Marital Status: Widowed    Tobacco Counseling Counseling given: Not Answered   Clinical Intake:                        Activities of Daily Living    09/13/2022    3:14 PM  In your present state of health, do you have any difficulty performing the following activities:  Hearing? 0  Vision? 0  Difficulty concentrating or making decisions? 0  Walking or climbing stairs? 0  Dressing or bathing? 0  Doing errands, shopping? 0  Preparing Food and eating ? N  Using the Toilet? N  In the past six months, have you accidently leaked urine? N  Do you have problems with loss of bowel control? N  Managing your Medications? N  Managing your Finances? N  Housekeeping or managing your Housekeeping? N    Patient Care Team: Claiborne Rigg, NP as PCP - General (Nurse Practitioner) Runell Gess, MD as PCP - Cardiology (Cardiology) Regan Lemming, MD as PCP - Electrophysiology (Cardiology) Bensimhon, Bevelyn Buckles, MD as PCP - Advanced Heart Failure (Cardiology) Johney Maine, MD as Consulting Physician (Hematology)  Indicate any recent Medical Services you  may have received from other than [facility] providers in the past year (date may be approximate).     Assessment:   This is a routine wellness examination for [place].  Hearing/Vision screen No results found.  Dietary  issues and exercise activities discussed:     Goals Addressed   None    Depression Screen    02/05/2023    8:52 AM 10/07/2022   10:23 AM 08/13/2022   10:23 AM 06/27/2021    8:44 AM 03/06/2021    3:19 PM 01/16/2021    3:01 PM 10/30/2020    8:52 AM  PHQ 2/9 Scores  PHQ - 2 Score 0 0 1 0 0 0 0  PHQ- 9 Score 0 0 1  0 0     Fall Risk    02/05/2023    8:52 AM 10/07/2022   10:20 AM 08/13/2022    9:56 AM 06/27/2021    8:44 AM 03/06/2021    3:19 PM  Fall Risk   Falls in the past year? 0 0 0 0 0  Number falls in past yr: 0 0 0 0 0  Injury with Fall? 0 0 0 0 0  Risk for fall due to : No Fall Risks   No Fall Risks No Fall Risks  Follow up Falls evaluation completed Falls evaluation completed Falls evaluation completed      MEDICARE RISK AT HOME:   TIMED UP AND GO:  Was the test performed?  No    Cognitive Function:    09/13/2022    3:48 PM 03/06/2021    3:20 PM  MMSE - Mini Mental State Exam  Orientation to time 5 5  Orientation to Place 5 5  Registration 3 3  Attention/ Calculation 5 5  Recall 3 3  Language- name 2 objects 2 2  Language- repeat 1 1  Language- follow 3 step command 3 3  Language- read & follow direction 1 1  Write a sentence 1 1  Copy design 1 0  Total score 30 29        09/13/2022    3:49 PM  6CIT Screen  What Year? 0 points  What month? 0 points  Count back from 20 0 points  Months in reverse 0 points  Repeat phrase 0 points    Immunizations Immunization History  Administered Date(s) Administered   Influenza,inj,Quad PF,6+ Mos 05/21/2016, 06/21/2019, 10/30/2020, 06/27/2021   Influenza-Unspecified 05/31/2022   Moderna Sars-Covid-2 Vaccination 11/25/2019, 12/23/2019, 07/12/2020   Pneumococcal Conjugate-13 01/16/2021   Pneumococcal Polysaccharide-23 05/21/2016   Tdap 11/26/2016, 02/15/2019   Zoster Recombinant(Shingrix) 03/06/2021, 10/07/2022    TDAP status: Up to date  Flu Vaccine status: Due, Education has been provided regarding the  importance of this vaccine. Advised may receive this vaccine at local pharmacy or Health Dept. Aware to provide a copy of the vaccination record if obtained from local pharmacy or Health Dept. Verbalized acceptance and understanding.  Pneumococcal vaccine status: Up to date  Covid-19 vaccine status: Information provided on how to obtain vaccines.   Qualifies for Shingles Vaccine? Yes   Zostavax completed No   Shingrix Completed?: Yes  Screening Tests Health Maintenance  Topic Date Due   Colonoscopy  05/09/2021   COVID-19 Vaccine (4 - 2023-24 season) 05/31/2022   OPHTHALMOLOGY EXAM  06/18/2022   FOOT EXAM  01/16/2023   INFLUENZA VACCINE  05/01/2023   HEMOGLOBIN A1C  08/08/2023   Medicare Annual Wellness (AWV)  09/14/2023   Diabetic kidney evaluation - eGFR measurement  02/05/2024   Diabetic kidney evaluation - Urine ACR  02/05/2024   Pneumonia Vaccine 78+ Years old (3 of 3 - PPSV23 or PCV20) 01/16/2026   DTaP/Tdap/Td (3 - Td or Tdap) 02/14/2029   Hepatitis C Screening  Completed   Zoster Vaccines- Shingrix  Completed   HPV VACCINES  Aged Out    Health Maintenance  Health Maintenance Due  Topic Date Due   Colonoscopy  05/09/2021   COVID-19 Vaccine (4 - 2023-24 season) 05/31/2022   OPHTHALMOLOGY EXAM  06/18/2022   FOOT EXAM  01/16/2023   INFLUENZA VACCINE  05/01/2023    {Colorectal cancer screening:[id number]}  Lung Cancer Screening: (Low Dose CT Chest recommended if Age 10-80 years, 20 pack-year currently smoking OR have quit w/in 15years.) does not qualify.   Lung Cancer Screening Referral: n/a  Additional Screening:  Hepatitis C Screening: does qualify; Completed 05/21/16  Vision Screening: Recommended annual ophthalmology exams for early detection of glaucoma and other disorders of the eye. Is the patient up to date with their annual eye exam?  {YES/NO:21197} Who is the provider or what is the name of the office in which the patient attends annual eye exams? *** If  pt is not established with a provider, would they like to be referred to a provider to establish care? {YES/NO:21197}.   Dental Screening: Recommended annual dental exams for proper oral hygiene  Diabetic Foot Exam: {Diabetic Foot Exam:[id number]}  Community Resource Referral / Chronic Care Management: CRR required this visit?  {YES/NO:21197}  CCM required this visit?  {CCM Required choices:[phone]}     Plan:     I have personally reviewed and noted the following in the patient's chart:   Medical and social history Use of alcohol, tobacco or illicit drugs  Current medications and supplements including opioid prescriptions. {Opioid Prescriptions:[phone]} Functional ability and status Nutritional status Physical activity Advanced directives List of other physicians Hospitalizations, surgeries, and ER visits in previous 12 months Vitals Screenings to include cognitive, depression, and falls Referrals and appointments  In addition, I have reviewed and discussed with patient certain preventive protocols, quality metrics, and best practice recommendations. A written personalized care plan for preventive services as well as general preventive health recommendations were provided to patient.     Kandis Fantasia [place], [place]   03/02/1307   After Visit Summary: {CHL AMB AWV After Visit [phone]}  Nurse Notes: ***

## 2023-05-05 NOTE — ED Triage Notes (Signed)
Pt BIB [facility] c/o neck pain, states felt he slept wrong but then felt a pop and has had extreme pain since, hx of neck surgury with 4 artificial bones places, pt reports decreased ROM due to pain, v/s en route HR 108, 98% RA, 124/76, RR 18

## 2023-05-05 NOTE — Discharge Instructions (Signed)
You were evaluated in the Emergency Department and after careful evaluation, we did not find any emergent condition requiring admission or further testing in the hospital.  Your exam/testing today is overall reassuring.  Suspect muscle strain or spasm.  Use Tylenol 1000 mg every 4-6 hours for pain.  Use the numbing patches daily for pain.  Can use the Robaxin muscle relaxer at night if you are having trouble sleeping due to the pain but use caution because it can cause drowsiness.  Please return to the Emergency Department if you experience any worsening of your condition.   Thank you for allowing [place] to be a part of your care.

## 2023-05-05 NOTE — ED Provider Notes (Signed)
[facility] Emergency Department Provider Note MRN:  [id number]  Arrival date & time: 05/05/23     Chief Complaint   Neck Pain   History of Present Illness   Jonathan Dickson is a 67 y.o. year-old male with a history of A-fib, diabetes, heart failure, amyloidosis presenting to the ED with chief complaint of neck pain.  Woke up a few days ago with a stiff neck, pain with trying to rotate the neck.  Got worse today when he was trying to stand up or sit up he felt a pop in his neck and the pain got a lot worse.  No new numbness or weakness to the arms or legs, no bowel or bladder dysfunction, no fever.  No falls or trauma.  Review of Systems  A thorough review of systems was obtained and all systems are negative except as noted in the HPI and PMH.   Patient's Health History    Past Medical History:  Diagnosis Date   Abnormal TSH 02/2016   AICD (automatic cardioverter/defibrillator) present    Anxiety    Blood clotting disorder (HCC)    Bronchiectasis (HCC)    left lower lung   Cardiac amyloidosis (HCC)    Cervical disc disease    Chronic combined systolic and diastolic CHF (congestive heart failure) (HCC)    a. 01/2016 Echo: EF 25%, inf AK, diffuse sev HK, Gr1 DD, mild MR.   Coronary atherosclerosis    Depression    Diabetes mellitus without complication (HCC)    Dyspnea    Dysrhythmia    Full thickness rotator cuff tear 06/2021   MRI   History of DVT (deep vein thrombosis) 08/04/2019   venous doppler US   History of hiatal hernia    Hx of adenomatous polyp of colon    Hyperglycemia    Hyperlipidemia    Hypertension    Hypertensive heart disease    Myocardial infarction [facility])    ??  maybe   NICM (nonischemic cardiomyopathy) (HCC)    a. 01/2016 Echo: EF 25%, inf AK, diffuse sev HK, Gr1 DD, mild MR; b. 01/2016 MV: EF 22%, no isch/infarct;  c. 02/2016 Cath: Nl cors, EF 35-45%.   Osteoarthritis of hips, bilateral    Paraseptal emphysema (HCC)    mild    Peanut allergy    Pneumonia    Pre-diabetes    Presence of implantable cardioverter-defibrillator (ICD)    S/P repair of ventral hernia    Seizures (HCC)    with penicillin   Sensorineural hearing loss     Past Surgical History:  Procedure Laterality Date   ANTERIOR CERVICAL DECOMP/DISCECTOMY FUSION N/A 07/10/2016   Procedure: ANTERIOR CERVICAL DECOMPRESSION FUSION CERVICAL 4-5, CERVICAL 5-6, CERVICAL 6-7 WITH INSTRUMENTATION AND ALLOGRAFT;  Surgeon: Estill Bamberg, MD;  Location: [facility];  Service: Orthopedics;  Laterality: N/A;   ANTERIOR CERVICAL DECOMP/DISCECTOMY FUSION N/A 04/19/2021   Procedure: ANTERIOR CERVICAL DECOMPRESSION FUSION CERVICAL 3- CERVICAL 4 WITH INSTRUMENTATION AND ALLOGRAFT;  Surgeon: Estill Bamberg, MD;  Location: [facility] OR;  Service: Orthopedics;  Laterality: N/A;   CARDIAC CATHETERIZATION N/A 03/11/2016   Procedure: Left Heart Cath and Coronary Angiography;  Surgeon: Marykay Lex, MD;  Location: [facility] INVASIVE CV LAB;  Service: Cardiovascular;  Laterality: N/A;   COLONOSCOPY     ICD IMPLANT N/A 06/17/2018   Procedure: ICD IMPLANT;  Surgeon: Regan Lemming, MD;  Location: [facility] INVASIVE CV LAB;  Service: Cardiovascular;  Laterality: N/A;   Thumb surgery Right  VENTRAL HERNIA REPAIR N/A 10/25/2019   Procedure: PRIMARY VENTRAL HERNIA REPAIR;  Surgeon: Berna Bue, MD;  Location: [facility] ORS;  Service: General;  Laterality: N/A;    Family History  Problem Relation Age of Onset   Diabetes Mother    Diabetes Sister    Clotting disorder Neg Hx    Lung disease Neg Hx    Colon cancer Neg Hx    Esophageal cancer Neg Hx    Stomach cancer Neg Hx    Pancreatic cancer Neg Hx     Social History   Socioeconomic History   Marital status: Legally Separated    Spouse name: Not on file   Number of children: 3   Years of education: Not on file   Highest education level: 10th grade  Occupational History   Not on file  Tobacco Use   Smoking status: Former    Current  packs/day: 0.00    Average packs/day: 1.5 packs/day for 18.0 years (27.0 ttl pk-yrs)    Types: Cigarettes    Start date: 03/14/1985    Quit date: 03/15/2003    Years since quitting: 20.1   Smokeless tobacco: Never  Vaping Use   Vaping status: Never Used  Substance and Sexual Activity   Alcohol use: No    Comment: none since 2004   Drug use: No    Comment: former  none since 2004   Sexual activity: Not Currently  Other Topics Concern   Not on file  Social History Narrative   Not on file   Social Determinants of Health   Financial Resource Strain: Low Risk  (02/01/2023)   Overall Financial Resource Strain (CARDIA)    Difficulty of Paying Living Expenses: Not very hard  Food Insecurity: No Food Insecurity (02/01/2023)   Hunger Vital Sign    Worried About Running Out of Food in the Last Year: Never true    Ran Out of Food in the Last Year: Never true  Transportation Needs: No Transportation Needs (02/01/2023)   PRAPARE - Administrator, Civil Service (Medical): No    Lack of Transportation (Non-Medical): No  Physical Activity: Inactive (02/01/2023)   Exercise Vital Sign    Days of Exercise per Week: 0 days    Minutes of Exercise per Session: 30 min  Stress: No Stress Concern Present (02/01/2023)   [employer] of Occupational Health - Occupational Stress Questionnaire    Feeling of Stress : Not at all  Social Connections: Moderately Isolated (02/01/2023)   Social Connection and Isolation Panel [NHANES]    Frequency of Communication with Friends and Family: Twice a week    Frequency of Social Gatherings with Friends and Family: Once a week    Attends Religious Services: 1 to 4 times per year    Active Member of [employer] or Organizations: No    Attends Banker Meetings: Not on file    Marital Status: Widowed  Intimate Partner Violence: Not on file     Physical Exam   Vitals:   05/05/23 0400 05/05/23 0500  BP: (!) 130/93 130/82  Pulse: 77 79  Resp: (!) 21  (!) 27  Temp:    SpO2: 100% 100%    CONSTITUTIONAL: Chronically ill-appearing, NAD NEURO/PSYCH:  Alert and oriented x 3, decreased sensation to the right arm (chronic) EYES:  eyes equal and reactive ENT/NECK:  no LAD, no JVD CARDIO: Regular rate, well-perfused, normal S1 and S2 PULM:  CTAB no wheezing or rhonchi GI/GU:  non-distended,  non-tender MSK/SPINE:  No gross deformities, no edema SKIN:  no rash, atraumatic   *Additional and/or pertinent findings included in MDM below  Diagnostic and Interventional Summary    EKG Interpretation Date/Time:    Ventricular Rate:    PR Interval:    QRS Duration:    QT Interval:    QTC Calculation:   R Axis:      Text Interpretation:         Labs Reviewed  I-STAT CHEM 8, ED - Abnormal; Notable for the following components:      Result Value   Chloride 114 (*)    Creatinine, Ser 1.50 (*)    Glucose, Bld 115 (*)    Calcium, Ion 1.04 (*)    TCO2 15 (*)    All other components within normal limits    CT C-SPINE NO CHARGE  Final Result    CT ANGIO HEAD NECK W WO CM  Final Result      Medications  diphenhydrAMINE (BENADRYL) capsule 50 mg (has no administration in time range)    Or  diphenhydrAMINE (BENADRYL) injection 50 mg (has no administration in time range)  methylPREDNISolone sodium succinate (SOLU-MEDROL) 40 mg/mL injection 40 mg (40 mg Intravenous Given 05/05/23 0301)  iohexol (OMNIPAQUE) 350 MG/ML injection 75 mL (75 mLs Intravenous Contrast Given 05/05/23 0337)  oxyCODONE (Oxy IR/ROXICODONE) immediate release tablet 5 mg (5 mg Oral Given 05/05/23 0343)     Procedures  /  Critical Care Procedures  ED Course and Medical Decision Making  Initial Impression and Ddx Suspect MSK related pain, probably muscular strain or spasm of the trapezius or paraspinal cervical muscles.  He has tenderness to this area.  He also has a history of multiple neck surgeries with hardware in place.  Past medical/surgical history that increases  complexity of ED encounter: Multiple neck surgeries  Interpretation of Diagnostics I personally reviewed the laboratory assessment and my interpretation is as follows: No significant blood count or electrolyte disturbance  CT imaging without obvious hardware complication or new fracture, CTA imaging obtained without signs of dissection or vascular emergency or stroke.  Patient Reassessment and Ultimate Disposition/Management     Discharge  Patient management required discussion with the following services or consulting groups:  None  Complexity of Problems Addressed Acute illness or injury that poses threat of life of bodily function  Additional Data Reviewed and Analyzed Further history obtained from: None  Additional Factors Impacting ED Encounter Risk Prescriptions  Elmer Sow. Pilar Plate, MD [facility] Emergency Medicine [facility] [email or web address]  Final Clinical Impressions(s) / ED Diagnoses     ICD-10-CM   1. Neck pain  M54.2       ED Discharge Orders          Ordered    lidocaine (LIDODERM) 5 %  Every 24 hours        05/05/23 0539    methocarbamol (ROBAXIN) 500 MG tablet  Every 8 hours PRN        05/05/23 0539             Discharge Instructions Discussed with and Provided to Patient:    Discharge Instructions      You were evaluated in the Emergency Department and after careful evaluation, we did not find any emergent condition requiring admission or further testing in the hospital.  Your exam/testing today is overall reassuring.  Suspect muscle strain or spasm.  Use Tylenol 1000 mg every 4-6 hours for pain.  Use the numbing  patches daily for pain.  Can use the Robaxin muscle relaxer at night if you are having trouble sleeping due to the pain but use caution because it can cause drowsiness.  Please return to the Emergency Department if you experience any worsening of your condition.   Thank you for allowing [place] to be a part of your  care.      Sabas Sous, MD 05/05/23 [phone]

## 2023-05-05 NOTE — ED Provider Triage Note (Signed)
Emergency Medicine Provider Triage Evaluation Note  Jonathan Dickson , a 67 y.o. male  was evaluated in triage.  Pt complains of presents with neck and back the head pain, states he slept on the floor yesterday morning, today while he was laying he sat up and felt a sharp pain in the back of his neck and head, states he has paresthesias in his right arm but states this is normal for him, denies any change in vision or new weakness.  Currently on warfarin, denies any recent head trauma.  Review of Systems  Positive: Neck pain, headache Negative: Nausea, vomiting  Physical Exam  BP 136/73 (BP Location: Left Arm)   Pulse 97   Temp 97.9 F (36.6 C)   Resp 20   Ht 5\' 6"  (1.676 m)   Wt 78 kg   SpO2 99%   BMI 27.76 kg/m  Gen:   Awake, no distress   Resp:  Normal effort  MSK:   Moves extremities without difficulty  Other:  Cranial nerves II through XII grossly intact no difficulty with word finding, following step  commands there is no regular weakness present.  Medical Decision Making  Medically screening exam initiated at 1:50 AM.  Appropriate orders placed.  Jonathan Dickson was informed that the remainder of the evaluation will be completed by another provider, this initial triage assessment does not replace that evaluation, and the importance of remaining in the ED until their evaluation is complete.  Lab workup and imaging have been ordered will need further workup.   Carroll Sage, PA-C 05/05/23 [phone]

## 2023-05-05 NOTE — Patient Instructions (Signed)
Jonathan Dickson , Thank you for taking time to come for your Medicare Wellness Visit. I appreciate your ongoing commitment to your health goals. Please review the following plan we discussed and let me know if I can assist you in the future.   Referrals/Orders/Follow-Ups/Clinician Recommendations: Aim for 30 minutes of exercise or brisk walking, 6-8 glasses of water, and 5 servings of fruits and vegetables each day.  This is a list of the screening recommended for you and due dates:  Health Maintenance  Topic Date Due   Colon Cancer Screening  05/09/2021   COVID-19 Vaccine (4 - 2023-24 season) 05/31/2022   Eye exam for diabetics  06/18/2022   Complete foot exam   01/16/2023   Flu Shot  05/01/2023   Hemoglobin A1C  08/08/2023   Yearly kidney function blood test for diabetes  02/05/2024   Yearly kidney health urinalysis for diabetes  02/05/2024   Medicare Annual Wellness Visit  05/05/2024   Pneumonia Vaccine (3 of 3 - PPSV23 or PCV20) 01/16/2026   DTaP/Tdap/Td vaccine (3 - Td or Tdap) 02/14/2029   Hepatitis C Screening  Completed   Zoster (Shingles) Vaccine  Completed   HPV Vaccine  Aged Out    Advanced directives: (Copy Requested) Please bring a copy of your health care power of attorney and living will to the office to be added to your chart at your convenience.  Next Medicare Annual Wellness Visit scheduled for next year: Yes  Preventive Care 43 Years and Older, Male Preventive care refers to lifestyle choices and visits with your health care provider that can promote health and wellness. What does preventive care include? A yearly physical exam. This is also called an annual well check. Dental exams once or twice a year. Routine eye exams. Ask your health care provider how often you should have your eyes checked. Personal lifestyle choices, including: Daily care of your teeth and gums. Regular physical activity. Eating a healthy diet. Avoiding tobacco and drug use. Limiting  alcohol use. Practicing safe sex. Taking low-dose aspirin every day. Taking vitamin and mineral supplements as recommended by your health care provider. What happens during an annual well check? The services and screenings done by your health care provider during your annual well check will depend on your age, overall health, lifestyle risk factors, and family history of disease. Counseling  Your health care provider may ask you questions about your: Alcohol use. Tobacco use. Drug use. Emotional well-being. Home and relationship well-being. Sexual activity. Eating habits. History of falls. Memory and ability to understand (cognition). Work and work Astronomer. Reproductive health. Screening  You may have the following tests or measurements: Height, weight, and BMI. Blood pressure. Lipid and cholesterol levels. These may be checked every 5 years, or more frequently if you are over 54 years old. Skin check. Lung cancer screening. You may have this screening every year starting at age 53 if you have a 30-pack-year history of smoking and currently smoke or have quit within the past 15 years. Fecal occult blood test (FOBT) of the stool. You may have this test every year starting at age 85. Flexible sigmoidoscopy or colonoscopy. You may have a sigmoidoscopy every 5 years or a colonoscopy every 10 years starting at age 4. Hepatitis C blood test. Hepatitis B blood test. Sexually transmitted disease (STD) testing. Diabetes screening. This is done by checking your blood sugar (glucose) after you have not eaten for a while (fasting). You may have this done every 1-3 years. Bone density  scan. This is done to screen for osteoporosis. You may have this done starting at age 42. Mammogram. This may be done every 1-2 years. Talk to your health care provider about how often you should have regular mammograms. Talk with your health care provider about your test results, treatment options, and if  necessary, the need for more tests. Vaccines  Your health care provider may recommend certain vaccines, such as: Influenza vaccine. This is recommended every year. Tetanus, diphtheria, and acellular pertussis (Tdap, Td) vaccine. You may need a Td booster every 10 years. Zoster vaccine. You may need this after age 19. Pneumococcal 13-valent conjugate (PCV13) vaccine. One dose is recommended after age 9. Pneumococcal polysaccharide (PPSV23) vaccine. One dose is recommended after age 25. Talk to your health care provider about which screenings and vaccines you need and how often you need them. This information is not intended to replace advice given to you by your health care provider. Make sure you discuss any questions you have with your health care provider. Document Released: 10/13/2015 Document Revised: 06/05/2016 Document Reviewed: 07/18/2015 Elsevier Interactive Patient Education  2017 [employer].  Fall Prevention in the Home Falls can cause injuries. They can happen to people of all ages. There are many things you can do to make your home safe and to help prevent falls. What can I do on the outside of my home? Regularly fix the edges of walkways and driveways and fix any cracks. Remove anything that might make you trip as you walk through a door, such as a raised step or threshold. Trim any bushes or trees on the path to your home. Use bright outdoor lighting. Clear any walking paths of anything that might make someone trip, such as rocks or tools. Regularly check to see if handrails are loose or broken. Make sure that both sides of any steps have handrails. Any raised decks and porches should have guardrails on the edges. Have any leaves, snow, or ice cleared regularly. Use sand or salt on walking paths during winter. Clean up any spills in your garage right away. This includes oil or grease spills. What can I do in the bathroom? Use night lights. Install grab bars by the toilet  and in the tub and shower. Do not use towel bars as grab bars. Use non-skid mats or decals in the tub or shower. If you need to sit down in the shower, use a plastic, non-slip stool. Keep the floor dry. Clean up any water that spills on the floor as soon as it happens. Remove soap buildup in the tub or shower regularly. Attach bath mats securely with double-sided non-slip rug tape. Do not have throw rugs and other things on the floor that can make you trip. What can I do in the bedroom? Use night lights. Make sure that you have a light by your bed that is easy to reach. Do not use any sheets or blankets that are too big for your bed. They should not hang down onto the floor. Have a firm chair that has side arms. You can use this for support while you get dressed. Do not have throw rugs and other things on the floor that can make you trip. What can I do in the kitchen? Clean up any spills right away. Avoid walking on wet floors. Keep items that you use a lot in easy-to-reach places. If you need to reach something above you, use a strong step stool that has a grab bar. Keep electrical  cords out of the way. Do not use floor polish or wax that makes floors slippery. If you must use wax, use non-skid floor wax. Do not have throw rugs and other things on the floor that can make you trip. What can I do with my stairs? Do not leave any items on the stairs. Make sure that there are handrails on both sides of the stairs and use them. Fix handrails that are broken or loose. Make sure that handrails are as long as the stairways. Check any carpeting to make sure that it is firmly attached to the stairs. Fix any carpet that is loose or worn. Avoid having throw rugs at the top or bottom of the stairs. If you do have throw rugs, attach them to the floor with carpet tape. Make sure that you have a light switch at the top of the stairs and the bottom of the stairs. If you do not have them, ask someone to add  them for you. What else can I do to help prevent falls? Wear shoes that: Do not have high heels. Have rubber bottoms. Are comfortable and fit you well. Are closed at the toe. Do not wear sandals. If you use a stepladder: Make sure that it is fully opened. Do not climb a closed stepladder. Make sure that both sides of the stepladder are locked into place. Ask someone to hold it for you, if possible. Clearly mark and make sure that you can see: Any grab bars or handrails. First and last steps. Where the edge of each step is. Use tools that help you move around (mobility aids) if they are needed. These include: Canes. Walkers. Scooters. Crutches. Turn on the lights when you go into a dark area. Replace any light bulbs as soon as they burn out. Set up your furniture so you have a clear path. Avoid moving your furniture around. If any of your floors are uneven, fix them. If there are any pets around you, be aware of where they are. Review your medicines with your doctor. Some medicines can make you feel dizzy. This can increase your chance of falling. Ask your doctor what other things that you can do to help prevent falls. This information is not intended to replace advice given to you by your health care provider. Make sure you discuss any questions you have with your health care provider. Document Released: 07/13/2009 Document Revised: 02/22/2016 Document Reviewed: 10/21/2014 Elsevier Interactive Patient Education  2017 [employer].

## 2023-05-06 ENCOUNTER — Ambulatory Visit: Payer: 59 | Attending: Nurse Practitioner

## 2023-05-06 VITALS — Ht 66.0 in | Wt 172.0 lb

## 2023-05-06 DIAGNOSIS — Z0000 Encounter for general adult medical examination without abnormal findings: Secondary | ICD-10-CM | POA: Diagnosis not present

## 2023-05-08 ENCOUNTER — Ambulatory Visit: Payer: 59 | Admitting: *Deleted

## 2023-05-08 DIAGNOSIS — I824Y9 Acute embolism and thrombosis of unspecified deep veins of unspecified proximal lower extremity: Secondary | ICD-10-CM | POA: Diagnosis not present

## 2023-05-08 DIAGNOSIS — Z7901 Long term (current) use of anticoagulants: Secondary | ICD-10-CM

## 2023-05-08 LAB — POCT INR: INR: 3.3 — AB (ref 2.0–3.0)

## 2023-05-08 NOTE — Patient Instructions (Signed)
Description   Call us with the procedure date as soon as you know [phone] Do not take any warfarin tomorrow then continue taking warfarin 1 tablet (5mg ) daily, except 2 tablets (10mg ) on Monday, Wednesday and Friday.  Recheck INR in 3 weeks (normally 6 weeks).  [facility] [phone] Procedure Clearance Fax [phone] or [phone]

## 2023-05-09 ENCOUNTER — Telehealth: Payer: Self-pay | Admitting: Adult Health

## 2023-05-09 ENCOUNTER — Ambulatory Visit: Payer: 59 | Attending: Cardiology

## 2023-05-09 DIAGNOSIS — Z01810 Encounter for preprocedural cardiovascular examination: Secondary | ICD-10-CM | POA: Diagnosis not present

## 2023-05-09 DIAGNOSIS — Z01818 Encounter for other preprocedural examination: Secondary | ICD-10-CM

## 2023-05-09 NOTE — Progress Notes (Signed)
Virtual Visit via Telephone Note   Because of Jonathan Dickson's co-morbid illnesses, he is at least at moderate risk for complications without adequate follow up.  This format is felt to be most appropriate for this patient at this time.  The patient did not have access to video technology/had technical difficulties with video requiring transitioning to audio format only (telephone).  All issues noted in this document were discussed and addressed.  No physical exam could be performed with this format.  Please refer to the patient's chart for his consent to telehealth for [facility].  Evaluation Performed:  Preoperative cardiovascular risk assessment _____________   Date:  05/09/2023   Patient ID:  Jonathan Dickson, DOB Feb 06, 1956, MRN [id number] Patient Location:  Home Provider location:   Office  Primary Care Provider:  Claiborne Rigg, NP Primary Cardiologist:  Nanetta Batty, MD  Chief Complaint / Patient Profile   67 y.o. y/o male with a h/o hypertension, PE in 2019, previous polysubstance abuse including heroin quitting in 2004, systolic heart failure due to nonischemic cardiomyopathy (EF of 30 to 35%), TTR cardiac amyloidosis, status post [facility] Jude ICD implant on 06/17/2018, [place] heart association III-IIIb.  Patient is on Coumadin therapy  He is pending C7-T1 cervical interlaminar epidural steroid injection Claria Dice  on date to be determined and presents today for telephonic preoperative cardiovascular risk assessment.  History of Present Illness    QUINCY Dickson is a 67 y.o. male who presents via audio/video conferencing for a telehealth visit today.  Pt was last seen in cardiology clinic on 11/18/2022 by Caro Laroche, FNP.  At that time SAYED PIWOWAR was doing well .  The patient is now pending procedure as outlined above. Since his last visit, he has done well but has some limitations concerning his neck and arms as a result. No chest pain, DOE, Dizziness, swelling  or fatigue. Works as a Firefighter.   Past Medical History    Past Medical History:  Diagnosis Date   Abnormal TSH 02/2016   AICD (automatic cardioverter/defibrillator) present    Anxiety    Blood clotting disorder (HCC)    Bronchiectasis (HCC)    left lower lung   Cardiac amyloidosis (HCC)    Cervical disc disease    Chronic combined systolic and diastolic CHF (congestive heart failure) (HCC)    a. 01/2016 Echo: EF 25%, inf AK, diffuse sev HK, Gr1 DD, mild MR.   Coronary atherosclerosis    Depression    Diabetes mellitus without complication (HCC)    Dyspnea    Dysrhythmia    Full thickness rotator cuff tear 06/2021   MRI   History of DVT (deep vein thrombosis) 08/04/2019   venous doppler US   History of hiatal hernia    Hx of adenomatous polyp of colon    Hyperglycemia    Hyperlipidemia    Hypertension    Hypertensive heart disease    Myocardial infarction [facility])    ??  maybe   NICM (nonischemic cardiomyopathy) (HCC)    a. 01/2016 Echo: EF 25%, inf AK, diffuse sev HK, Gr1 DD, mild MR; b. 01/2016 MV: EF 22%, no isch/infarct;  c. 02/2016 Cath: Nl cors, EF 35-45%.   Osteoarthritis of hips, bilateral    Paraseptal emphysema (HCC)    mild   Peanut allergy    Pneumonia    Pre-diabetes    Presence of implantable cardioverter-defibrillator (ICD)    S/P repair of ventral hernia  Seizures (HCC)    with penicillin   Sensorineural hearing loss    Past Surgical History:  Procedure Laterality Date   ANTERIOR CERVICAL DECOMP/DISCECTOMY FUSION N/A 07/10/2016   Procedure: ANTERIOR CERVICAL DECOMPRESSION FUSION CERVICAL 4-5, CERVICAL 5-6, CERVICAL 6-7 WITH INSTRUMENTATION AND ALLOGRAFT;  Surgeon: Estill Bamberg, MD;  Location: [facility] OR;  Service: Orthopedics;  Laterality: N/A;   ANTERIOR CERVICAL DECOMP/DISCECTOMY FUSION N/A 04/19/2021   Procedure: ANTERIOR CERVICAL DECOMPRESSION FUSION CERVICAL 3- CERVICAL 4 WITH INSTRUMENTATION AND ALLOGRAFT;  Surgeon: Estill Bamberg, MD;  Location: [facility] OR;   Service: Orthopedics;  Laterality: N/A;   CARDIAC CATHETERIZATION N/A 03/11/2016   Procedure: Left Heart Cath and Coronary Angiography;  Surgeon: Marykay Lex, MD;  Location: [facility] INVASIVE CV LAB;  Service: Cardiovascular;  Laterality: N/A;   COLONOSCOPY     ICD IMPLANT N/A 06/17/2018   Procedure: ICD IMPLANT;  Surgeon: Regan Lemming, MD;  Location: [facility] INVASIVE CV LAB;  Service: Cardiovascular;  Laterality: N/A;   Thumb surgery Right    VENTRAL HERNIA REPAIR N/A 10/25/2019   Procedure: PRIMARY VENTRAL HERNIA REPAIR;  Surgeon: Berna Bue, MD;  Location: [facility] ORS;  Service: General;  Laterality: N/A;    Allergies  Allergies  Allergen Reactions   Peanut-Containing Drug Products Swelling   Penicillins Other (See Comments)    CONVULSIONS  Did it involve swelling of the face/tongue/throat, SOB, or low BP? No  Did it involve sudden or severe rash/hives, skin peeling, or any reaction on the inside of your mouth or nose? No  Did you need to seek medical attention at a hospital or doctor's office? Yes  When did it last happen?      25 years  If all above answers are "NO", may proceed with cephalosporin use.  CONVULSIONS, Has patient had a PCN reaction causing immediate rash, facial/tongue/throat swelling, SOB or lightheadedness with hypotension: No, Has patient had a PCN reaction causing severe rash involving mucus membranes or skin necrosis: No, Has patient had a PCN reaction that required hospitalization No, Has patient had a PCN reaction occurring within the last 10 years: No, If all of the above answers are "NO", then may proceed with Cephalosporin use.   Decadron [Dexamethasone] Itching   Ivp Dye [Iodinated Contrast Media]     Home Medications    Prior to Admission medications   Medication Sig Start Date End Date Taking? Authorizing Provider  acetaminophen (TYLENOL) 500 MG tablet Take 1,000 mg by mouth every 6 (six) hours as needed for moderate pain or headache.     [provider]  albuterol (VENTOLIN HFA) 108 (90 Base) MCG/ACT inhaler INHALE 2 PUFFS INTO THE LUNGS EVERY 6 HOURS AS NEEDED FOR WHEEZING OR SHORTNESS OF BREATH 05/02/23   Parrett, Virgel Bouquet, NP  Aromatic Inhalants (VICKS VAPOINHALER) INHA Inhale 1 Dose into the lungs daily as needed (congestion).    [provider]  atorvastatin (LIPITOR) 80 MG tablet Take 1 tablet (80 mg total) by mouth daily. 02/05/23   Claiborne Rigg, NP  carvedilol (COREG) 12.5 MG tablet Take 1 tablet (12.5 mg total) by mouth 2 (two) times daily with a meal. 11/18/22   Milford, Anderson Malta, FNP  Chlorphen-Phenyleph-ASA (ALKA-SELTZER PLUS COLD PO) Take 1-2 tablets by mouth 2 (two) times daily as needed (cold symptoms).     [provider]  cholecalciferol (VITAMIN D3) 25 MCG (1000 UNIT) tablet Take by mouth. 11/25/17   [provider]  cyclobenzaprine (FLEXERIL) 5 MG tablet Take 1 tablet (5  mg total) by mouth 3 (three) times daily as needed for muscle spasms. 11/08/21   Anders Simmonds, PA-C  dapagliflozin propanediol (FARXIGA) 10 MG TABS tablet Take 1 tablet (10 mg total) by mouth daily. 12/03/22   Bensimhon, Bevelyn Buckles, MD  diclofenac Sodium (VOLTAREN) 1 % GEL Apply 2 g topically 4 (four) times daily as needed (pain).    [provider]  fluticasone (FLONASE) 50 MCG/ACT nasal spray SHAKE LIQUID AND USE 1 SPRAY IN EACH NOSTRIL DAILY 04/17/23   Cobb, Ruby Cola, NP  lidocaine (LIDODERM) 5 % Place 1 patch onto the skin daily. Remove & Discard patch within 12 hours or as directed by MD 05/05/23   Sabas Sous, MD  loratadine (CLARITIN) 10 MG tablet Take 1 tablet (10 mg total) by mouth daily. 02/05/23   Claiborne Rigg, NP  metFORMIN (GLUCOPHAGE) 500 MG tablet Take 1 tablet (500 mg total) by mouth daily with breakfast. 02/05/23   Claiborne Rigg, NP  methocarbamol (ROBAXIN) 500 MG tablet Take 1 tablet (500 mg total) by mouth every 8 (eight) hours as needed for muscle spasms. 05/05/23   Sabas Sous,  MD  Misc. Devices MISC by Does not apply route. Flutter valve daily    [provider]  sacubitril-valsartan (ENTRESTO) 49-51 MG Take 1 tablet by mouth 2 (two) times daily. 11/18/22   Jacklynn Ganong, FNP  spironolactone (ALDACTONE) 25 MG tablet Take 0.5 tablets (12.5 mg total) by mouth daily. TAKE 1/2 TABLET(12.5 MG) BY MOUTH DAILY 11/18/22   Milford, Anderson Malta, FNP  Tafamidis [facility]) 61 MG CAPS Take 1 capsule by mouth daily. 11/19/22   Bensimhon, Bevelyn Buckles, MD  warfarin (COUMADIN) 5 MG tablet TAKE 1 TO 2 TABLETS BY MOUTH DAILY AS DIRECTED BY COUMADIN CLINIC 04/11/23   Runell Gess, MD    Physical Exam    Vital Signs:  [name] does not have vital signs available for review today.  Given telephonic nature of communication, physical exam is limited. AAOx3. NAD. Normal affect.  Speech and respirations are unlabored.  Accessory Clinical Findings    None  Assessment & Plan    1.  Preoperative Cardiovascular Risk Assessment:  According to the Revised Cardiac Risk Index (RCRI), his Perioperative Risk of Major Cardiac Event is (%): 0.4  His Functional Capacity in METs is: 6.45 according to the [place] Activity Status Index (DASI).   The patient was advised that if he develops new symptoms prior to surgery to contact our office to arrange for a follow-up visit, and he verbalized understanding.  Per office protocol, patient can hold warfarin for 5 days prior to procedure.    Patient WILL need bridging with Lovenox (enoxaparin) around procedure. Awaiting date to bridge Lovenox  Device clinic to make recommendations concerning ICD in situ   Therefore, based on ACC/AHA guidelines, patient would be at acceptable risk for the planned procedure without further cardiovascular testing. I will route this recommendation to the requesting party via Epic fax function.   A copy of this note will be routed to requesting surgeon.  Time:   Today, I have spent 10 minutes with the  patient with telehealth technology discussing medical history, symptoms, and management plan.     Joni Reining, NP  05/09/2023, 10:07 AM

## 2023-05-09 NOTE — Telephone Encounter (Signed)
Hi,  We have not received a clearance yet. As soon as we do, we will complete it and send over.

## 2023-05-09 NOTE — Telephone Encounter (Signed)
Please male recommendations on patient prior to procedure with ICD in situ.   Thank you, Joni Reining NP

## 2023-05-13 ENCOUNTER — Telehealth: Payer: Self-pay

## 2023-05-13 NOTE — Telephone Encounter (Signed)
[facility] Pre-operative Risk Assessment     Request for surgical clearance:     Endoscopy Procedure  What type of surgery is being performed?     EGD/COLONOSCOPY  When is this surgery scheduled?     05-20-23 possibly  What type of clearance is required ?   Pharmacy  Are there any medications that need to be held prior to surgery and how long? COUMADIN 5 DAYS  Practice name and name of physician performing surgery?       Gastroenterology DR Ileene Patrick  What is your office phone and fax number?      Phone- [phone]  Fax- [phone] ATTN: Jonathan Dickson  Anesthesia type (None, local, MAC, general) ?       MAC   THANK YOU

## 2023-05-13 NOTE — Telephone Encounter (Signed)
Fyi - Patient unable to have procedure on 8-20.  Procedure scheduled for 07-28-23. Thank you

## 2023-05-13 NOTE — Telephone Encounter (Signed)
Patient called to let us know he is unable to have his procedure on 8-20 as he already has a procedure that day.  I asked the patient to confirm he is aware of the Previsit to be instructed for his October appointment.  He indicated he will check MyChart

## 2023-05-13 NOTE — Telephone Encounter (Deleted)
-----   Message from Benancio Deeds sent at 05/13/2023 12:26 PM EDT ----- Regarding: RE: opening next week at [facility] - 8-20 Sorry, 5 days is okay. Thanks ----- Message ----- From: Cooper Render, CMA Sent: 05/13/2023  12:23 PM EDT To: Benancio Deeds, MD Subject: RE: opening next week at [facility] - 8-20             If this patient can do it next week, do we need to get clearance to hold Coumadin for 7 days or 5?? ----- Message ----- From: Benancio Deeds, MD Sent: 05/13/2023  12:14 PM EDT To: Cooper Render, CMA Subject: RE: opening next week at [facility] - 8-20             Okay. Thanks.  Any of the procedures scheduled in October or Sept - if a double and they want to move up. If we have no doubles who want to do it then would move up any colons or EGD? The patient we saw yesterday who scheduled for November, you can add her if she is willing?  Not sure if we already cancelled the patient in that slot who can't make it. If not, I would not formally cancel his case until we have someone to replace him. Once it is < 1 week out from the block, if there are any open slots they are considered first come first serve and another doc may put a case in there.   We can chat more about our hospital list if you want. I can't think of anyone in particular, I just don't want to have that slot go unused. ----- Message ----- From: Cooper Render, CMA Sent: 05/13/2023   8:55 AM EDT To: Benancio Deeds, MD Subject: opening next week at [facility] - 8-20                 Simmie Davies can't take the opening next week at [facility] on 8-20   This patient saw Victorino Dike and was scheduled for October. He is on Coumadin so I'm not sure we have enough time to get clearance IF he can do next week.  Jennifer's note said 7 day hold for Coumadin?  Please confirm you want a 7 day hold vs. 5  Otherwise - who else do you want me to try to move up?

## 2023-05-13 NOTE — Telephone Encounter (Signed)
I spoke with patient on 05/12/23 who informed me that his C7-T1 injection was scheduled for 05/20/23 and he will need Lovenox Bridge with Coumadin Hold.    I scheduled him for 8/14 to prepare Lovenox Bridge for upcoming Injection.  He verbalized understanding.

## 2023-05-13 NOTE — Telephone Encounter (Signed)
Patient with diagnosis of PE in 2019 and DVT in 2020 on warfarin for anticoagulation.    Procedure: EGD/colonoscopy Date of procedure: 05/20/23  CrCl 60mL/min Platelet count 211K  Per office protocol, patient can hold warfarin for 5 days prior to procedure. Patient will need bridging with Lovenox (enoxaparin) around procedure. Looks like he is also pending ESI procedure and will need bridging for that as well but date isn't set yet. Has INR appt tomorrow with [facility] to coordinate bridge.  **This guidance is not considered finalized until pre-operative APP has relayed final recommendations.**

## 2023-05-13 NOTE — Telephone Encounter (Signed)
   Patient Name: Jonathan Dickson  DOB: December 27, 1955 MRN: [id number]  Primary Cardiologist: Nanetta Batty, MD  Chart reviewed as part of pre-operative protocol coverage. Pre-op clearance already addressed by colleagues in earlier phone notes. To summarize recommendations:  - Per office protocol, patient can hold warfarin for 5 days prior to procedure. Patient will need bridging with Lovenox (enoxaparin) around procedure. Looks like he is also pending ESI procedure and will need bridging for that as well but date isn't set yet. Has INR appt tomorrow with [facility] to coordinate bridge.  - Medical clearance was not requested for EGD/colonoscopy. However, patient was seen on 8/9 for telehealth preop evaluation prior to epidural steroid injection. At that visit, it was determined that patient would be at acceptable risk for the planned procedure without further cardiovascular testing.   Will route this bundled recommendation to requesting provider via Epic fax function and remove from pre-op pool. Please call with questions.  Jonita Albee, PA-C 05/13/2023, 3:17 PM

## 2023-05-13 NOTE — Telephone Encounter (Signed)
Called and Left detailed message for patient that Dr Adela Lank has an opening for ECL next Tuesday, 8-20 at [facility] if he wants to move up his procedure. Will need to know asap bc we need to request clearance to hold Coumadin for 5 days. Sent request to [facility] in case patient can come next week. MyChart message to patient as well.

## 2023-05-14 ENCOUNTER — Ambulatory Visit: Payer: 59 | Attending: Cardiovascular Disease

## 2023-05-14 DIAGNOSIS — I824Y9 Acute embolism and thrombosis of unspecified deep veins of unspecified proximal lower extremity: Secondary | ICD-10-CM | POA: Diagnosis not present

## 2023-05-14 DIAGNOSIS — Z7901 Long term (current) use of anticoagulants: Secondary | ICD-10-CM

## 2023-05-14 LAB — POCT INR: INR: 2.1 (ref 2.0–3.0)

## 2023-05-14 MED ORDER — ENOXAPARIN SODIUM 80 MG/0.8ML IJ SOSY
80.0000 mg | PREFILLED_SYRINGE | Freq: Two times a day (BID) | INTRAMUSCULAR | 1 refills | Status: DC
Start: 2023-05-14 — End: 2023-08-06

## 2023-05-14 NOTE — Telephone Encounter (Signed)
Patient is NOT having his colonoscopy procedure on 8-20.  He is now scheduled for 07-28-23. Please arrange for his Lovenox bridge prior to 10-28 procedure

## 2023-05-14 NOTE — Patient Instructions (Addendum)
Description   Call us with the procedure date as soon as you know [phone] Continue taking warfarin 1 tablet (5mg ) daily, except 2 tablets (10mg ) on Monday, Wednesday and Friday. Tomorrow start following pre/post procedure instructions.  Recheck INR in 1 week post procedure. (normally 6 weeks).  Coumadin Clinic [phone] Procedure Clearance Fax [phone] or [phone]      05/14/23: Last dose of warfarin.  05/15/23: No warfarin or enoxaparin (Lovenox).  05/16/23: Inject enoxaparin 80mg  in the fatty abdominal tissue at least 2 inches from the belly button twice a day about 12 hours apart, 8am and 8pm rotate sites. No warfarin.  05/17/23: Inject enoxaparin in the fatty tissue every 12 hours, 8am and 8pm. No warfarin.  05/18/23: Inject enoxaparin in the fatty tissue every 12 hours, 8am and 8pm. No warfarin.  05/19/23: Inject enoxaparin in the fatty tissue in the morning at 8 am (No PM dose). No warfarin.  05/20/23: Procedure Day - No enoxaparin - Resume warfarin in the evening or as directed by doctor.  05/21/23: Resume enoxaparin inject in the fatty tissue every 12 hours and take warfarin  05/22/23: Inject enoxaparin in the fatty tissue every 12 hours and take warfarin  05/23/23: Inject enoxaparin in the fatty tissue every 12 hours and take warfarin  05/24/23: Inject enoxaparin in the fatty tissue every 12 hours and take warfarin  05/25/23: Inject enoxaparin in the fatty tissue every 12 hours and take warfarin  05/26/23: warfarin appt to check INR.

## 2023-05-15 ENCOUNTER — Other Ambulatory Visit (HOSPITAL_COMMUNITY): Payer: Self-pay

## 2023-05-23 ENCOUNTER — Other Ambulatory Visit (HOSPITAL_COMMUNITY): Payer: Self-pay

## 2023-05-26 ENCOUNTER — Ambulatory Visit: Payer: 59 | Attending: Cardiology

## 2023-05-26 DIAGNOSIS — Z7901 Long term (current) use of anticoagulants: Secondary | ICD-10-CM

## 2023-05-26 DIAGNOSIS — I824Y9 Acute embolism and thrombosis of unspecified deep veins of unspecified proximal lower extremity: Secondary | ICD-10-CM

## 2023-05-26 LAB — POCT INR: INR: 1.5 — AB (ref 2.0–3.0)

## 2023-05-26 NOTE — Patient Instructions (Signed)
TAKE ANOTHER 1 TABLET TODAY AND 2 TABLETS TOMORROW THEN Continue taking warfarin 1 tablet (5mg ) daily, except 2 tablets (10mg ) on Monday, Wednesday and Friday.  Recheck INR in 2 weeks..  (normally 6 weeks).  Coumadin Clinic [phone] Procedure Clearance Fax [phone] or [phone]

## 2023-05-29 ENCOUNTER — Ambulatory Visit: Payer: 59

## 2023-06-04 ENCOUNTER — Inpatient Hospital Stay: Payer: 59

## 2023-06-04 ENCOUNTER — Inpatient Hospital Stay: Payer: 59 | Attending: Hematology | Admitting: Hematology

## 2023-06-04 ENCOUNTER — Other Ambulatory Visit: Payer: Self-pay

## 2023-06-04 VITALS — BP 98/64 | HR 97 | Temp 97.7°F | Resp 20 | Wt 168.3 lb

## 2023-06-04 DIAGNOSIS — I2699 Other pulmonary embolism without acute cor pulmonale: Secondary | ICD-10-CM | POA: Insufficient documentation

## 2023-06-04 DIAGNOSIS — D649 Anemia, unspecified: Secondary | ICD-10-CM

## 2023-06-04 DIAGNOSIS — I5042 Chronic combined systolic (congestive) and diastolic (congestive) heart failure: Secondary | ICD-10-CM | POA: Insufficient documentation

## 2023-06-04 DIAGNOSIS — E538 Deficiency of other specified B group vitamins: Secondary | ICD-10-CM | POA: Diagnosis not present

## 2023-06-04 DIAGNOSIS — I4891 Unspecified atrial fibrillation: Secondary | ICD-10-CM | POA: Diagnosis not present

## 2023-06-04 DIAGNOSIS — D509 Iron deficiency anemia, unspecified: Secondary | ICD-10-CM | POA: Insufficient documentation

## 2023-06-04 DIAGNOSIS — Z7901 Long term (current) use of anticoagulants: Secondary | ICD-10-CM | POA: Insufficient documentation

## 2023-06-04 DIAGNOSIS — Z87891 Personal history of nicotine dependence: Secondary | ICD-10-CM | POA: Diagnosis not present

## 2023-06-04 LAB — CBC WITH DIFFERENTIAL (CANCER CENTER ONLY)
Abs Immature Granulocytes: 0.03 10*3/uL (ref 0.00–0.07)
Basophils Absolute: 0 10*3/uL (ref 0.0–0.1)
Basophils Relative: 1 %
Eosinophils Absolute: 0.2 10*3/uL (ref 0.0–0.5)
Eosinophils Relative: 3 %
HCT: 39.9 % (ref 39.0–52.0)
Hemoglobin: 12.2 g/dL — ABNORMAL LOW (ref 13.0–17.0)
Immature Granulocytes: 0 %
Lymphocytes Relative: 29 %
Lymphs Abs: 2.1 10*3/uL (ref 0.7–4.0)
MCH: 23.4 pg — ABNORMAL LOW (ref 26.0–34.0)
MCHC: 30.6 g/dL (ref 30.0–36.0)
MCV: 76.6 fL — ABNORMAL LOW (ref 80.0–100.0)
Monocytes Absolute: 0.6 10*3/uL (ref 0.1–1.0)
Monocytes Relative: 8 %
Neutro Abs: 4.4 10*3/uL (ref 1.7–7.7)
Neutrophils Relative %: 59 %
Platelet Count: 231 10*3/uL (ref 150–400)
RBC: 5.21 MIL/uL (ref 4.22–5.81)
RDW: 18.2 % — ABNORMAL HIGH (ref 11.5–15.5)
WBC Count: 7.4 10*3/uL (ref 4.0–10.5)
nRBC: 0 % (ref 0.0–0.2)

## 2023-06-04 LAB — CMP (CANCER CENTER ONLY)
ALT: 17 U/L (ref 0–44)
AST: 22 U/L (ref 15–41)
Albumin: 4.3 g/dL (ref 3.5–5.0)
Alkaline Phosphatase: 66 U/L (ref 38–126)
Anion gap: 8 (ref 5–15)
BUN: 22 mg/dL (ref 8–23)
CO2: 23 mmol/L (ref 22–32)
Calcium: 9.9 mg/dL (ref 8.9–10.3)
Chloride: 106 mmol/L (ref 98–111)
Creatinine: 1.38 mg/dL — ABNORMAL HIGH (ref 0.61–1.24)
GFR, Estimated: 56 mL/min — ABNORMAL LOW (ref >60)
Glucose, Bld: 95 mg/dL (ref 70–99)
Potassium: 4.4 mmol/L (ref 3.5–5.1)
Sodium: 137 mmol/L (ref 135–145)
Total Bilirubin: 0.4 mg/dL (ref 0.3–1.2)
Total Protein: 7.5 g/dL (ref 6.5–8.1)

## 2023-06-04 LAB — VITAMIN B12: Vitamin B-12: 233 pg/mL (ref 180–914)

## 2023-06-04 LAB — IRON AND IRON BINDING CAPACITY (CC-WL,HP ONLY)
Iron: 34 ug/dL — ABNORMAL LOW (ref 45–182)
Saturation Ratios: 7 % — ABNORMAL LOW (ref 17.9–39.5)
TIBC: 487 ug/dL — ABNORMAL HIGH (ref 250–450)
UIBC: 453 ug/dL — ABNORMAL HIGH (ref 117–376)

## 2023-06-04 LAB — LACTATE DEHYDROGENASE: LDH: 185 U/L (ref 98–192)
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
## 2023-06-05 LAB — FERRITIN: Ferritin: 11 ng/mL — ABNORMAL LOW (ref 24–336)

## 2023-06-05 LAB — HAPTOGLOBIN: Haptoglobin: 236 mg/dL (ref 32–363)

## 2023-06-08 LAB — MULTIPLE MYELOMA PANEL, SERUM
Albumin SerPl Elph-Mcnc: 3.5 g/dL (ref 2.9–4.4)
Albumin/Glob SerPl: 1.1 (ref 0.7–1.7)
Alpha 1: 0.2 g/dL (ref 0.0–0.4)
Alpha2 Glob SerPl Elph-Mcnc: 0.9 g/dL (ref 0.4–1.0)
B-Globulin SerPl Elph-Mcnc: 1.2 g/dL (ref 0.7–1.3)
Gamma Glob SerPl Elph-Mcnc: 1 g/dL (ref 0.4–1.8)
Globulin, Total: 3.3 g/dL (ref 2.2–3.9)
IgA: 204 mg/dL (ref 61–437)
IgG (Immunoglobin G), Serum: 966 mg/dL (ref 603–1613)
IgM (Immunoglobulin M), Srm: 97 mg/dL (ref 20–172)
Total Protein ELP: 6.8 g/dL (ref 6.0–8.5)

## 2023-06-09 ENCOUNTER — Ambulatory Visit: Payer: 59 | Attending: Cardiovascular Disease

## 2023-06-09 ENCOUNTER — Other Ambulatory Visit: Payer: Self-pay

## 2023-06-09 DIAGNOSIS — Z7901 Long term (current) use of anticoagulants: Secondary | ICD-10-CM

## 2023-06-09 DIAGNOSIS — I824Y9 Acute embolism and thrombosis of unspecified deep veins of unspecified proximal lower extremity: Secondary | ICD-10-CM

## 2023-06-09 LAB — POCT INR: INR: 2.7 (ref 2.0–3.0)

## 2023-06-09 NOTE — Patient Instructions (Signed)
Continue taking warfarin 1 tablet (5mg ) daily, except 2 tablets (10mg ) on Monday, Wednesday and Friday. Colonoscopy 10/28, needs Lovenox Bridging Recheck INR in 6 weeks..  (normally 6 weeks).  Coumadin Clinic [phone] Procedure Clearance Fax [phone] or [phone]

## 2023-06-11 ENCOUNTER — Encounter: Payer: 59 | Admitting: Gastroenterology

## 2023-06-19 LAB — HM DIABETES EYE EXAM

## 2023-06-20 ENCOUNTER — Other Ambulatory Visit: Payer: Self-pay

## 2023-06-20 ENCOUNTER — Other Ambulatory Visit (HOSPITAL_COMMUNITY): Payer: Self-pay

## 2023-06-21 ENCOUNTER — Encounter (HOSPITAL_COMMUNITY): Payer: Self-pay

## 2023-06-23 ENCOUNTER — Other Ambulatory Visit: Payer: Self-pay | Admitting: Hematology

## 2023-06-23 DIAGNOSIS — D500 Iron deficiency anemia secondary to blood loss (chronic): Secondary | ICD-10-CM

## 2023-06-23 DIAGNOSIS — D509 Iron deficiency anemia, unspecified: Secondary | ICD-10-CM | POA: Insufficient documentation

## 2023-06-23 NOTE — Progress Notes (Signed)
IV iron orders

## 2023-06-24 ENCOUNTER — Ambulatory Visit (INDEPENDENT_AMBULATORY_CARE_PROVIDER_SITE_OTHER): Payer: 59

## 2023-06-24 ENCOUNTER — Telehealth: Payer: Self-pay

## 2023-06-24 ENCOUNTER — Other Ambulatory Visit: Payer: Self-pay | Admitting: Physician Assistant

## 2023-06-24 DIAGNOSIS — I428 Other cardiomyopathies: Secondary | ICD-10-CM

## 2023-06-24 DIAGNOSIS — I5022 Chronic systolic (congestive) heart failure: Secondary | ICD-10-CM

## 2023-06-24 LAB — CUP PACEART REMOTE DEVICE CHECK
Battery Remaining Longevity: 56 mo
Battery Remaining Percentage: 55 %
Battery Voltage: 2.95 V
Brady Statistic RV Percent Paced: 1 %
Date Time Interrogation Session: 20240924020015
HighPow Impedance: 68 Ohm
HighPow Impedance: 68 Ohm
Implantable Lead Connection Status: 753985
Implantable Lead Implant Date: 20190918
Implantable Lead Location: 753860
Implantable Lead Model: 7122
Implantable Pulse Generator Implant Date: 20190918
Lead Channel Impedance Value: 410 Ohm
Lead Channel Pacing Threshold Amplitude: 0.5 V
Lead Channel Pacing Threshold Pulse Width: 0.5 ms
Lead Channel Sensing Intrinsic Amplitude: 12 mV
Lead Channel Setting Pacing Amplitude: 2.5 V
Lead Channel Setting Pacing Pulse Width: 0.5 ms
Lead Channel Setting Sensing Sensitivity: 0.5 mV
Pulse Gen Serial Number: 9796641

## 2023-06-24 NOTE — Telephone Encounter (Signed)
Hello,  Auth Submission: NO AUTH NEEDED Site of care: Site of care: [facility] Payer: [facility] medicare Medication & CPT/J Code(s) submitted: Venofer (Iron Sucrose) J1756 Route of submission (phone, fax, portal): portal Phone # Fax # Auth type: Buy/Bill HB Units/visits requested: 300mg , up to 3 doses Reference number: [id number] Approval from: 06/24/23 to 06/23/24

## 2023-06-30 ENCOUNTER — Encounter: Payer: Self-pay | Admitting: Hematology

## 2023-06-30 ENCOUNTER — Ambulatory Visit (INDEPENDENT_AMBULATORY_CARE_PROVIDER_SITE_OTHER): Payer: 59

## 2023-06-30 VITALS — BP 104/67 | HR 87 | Temp 97.8°F | Resp 16 | Ht 66.0 in | Wt 173.2 lb

## 2023-06-30 DIAGNOSIS — D509 Iron deficiency anemia, unspecified: Secondary | ICD-10-CM | POA: Diagnosis not present

## 2023-06-30 MED ORDER — SODIUM CHLORIDE 0.9 % IV SOLN
200.0000 mg | Freq: Once | INTRAVENOUS | Status: DC
  Filled 2023-06-30: qty 10

## 2023-06-30 MED ORDER — SODIUM CHLORIDE 0.9 % IV SOLN
300.0000 mg | Freq: Once | INTRAVENOUS | Status: AC
  Administered 2023-06-30: 300 mg via INTRAVENOUS
  Filled 2023-06-30: qty 15

## 2023-06-30 MED ORDER — ACETAMINOPHEN 325 MG PO TABS: 650.0000 mg | ORAL_TABLET | Freq: Once | ORAL | Status: DC

## 2023-06-30 MED ORDER — ACETAMINOPHEN 325 MG PO TABS
650.0000 mg | ORAL_TABLET | Freq: Once | ORAL | Status: AC
  Administered 2023-06-30: 650 mg via ORAL
  Filled 2023-06-30: qty 2

## 2023-06-30 MED ORDER — DIPHENHYDRAMINE HCL 25 MG PO CAPS
25.0000 mg | ORAL_CAPSULE | Freq: Once | ORAL | Status: AC
  Administered 2023-06-30: 25 mg via ORAL
  Filled 2023-06-30: qty 1

## 2023-06-30 MED ORDER — DIPHENHYDRAMINE HCL 25 MG PO CAPS: 25.0000 mg | ORAL_CAPSULE | Freq: Once | ORAL | Status: DC

## 2023-06-30 NOTE — Addendum Note (Signed)
Addended by: Desma Mcgregor on: 06/30/2023 08:14 AM   Modules accepted: Orders

## 2023-06-30 NOTE — Progress Notes (Signed)
Diagnosis: Iron Deficiency Anemia  Provider:  Chilton Greathouse MD  Procedure: IV Infusion  IV Type: Peripheral, IV Location: L Antecubital  Venofer (Iron Sucrose), Dose: 300 mg  Infusion Start Time: 0933  Infusion Stop Time: 1116  Post Infusion IV Care: Observation period completed and Peripheral IV Discontinued  Discharge: Condition: Good, Destination: Home . AVS Provided  Performed by:  Rico Ala, LPN

## 2023-07-06 ENCOUNTER — Emergency Department (HOSPITAL_COMMUNITY)
Admission: EM | Admit: 2023-07-06 | Discharge: 2023-07-07 | Disposition: A | Discharge status: Home / Self Care | Payer: 59 | Attending: Emergency Medicine | Admitting: Emergency Medicine

## 2023-07-06 ENCOUNTER — Encounter (HOSPITAL_COMMUNITY): Payer: Self-pay | Admitting: *Deleted

## 2023-07-06 ENCOUNTER — Other Ambulatory Visit: Payer: Self-pay

## 2023-07-06 DIAGNOSIS — I509 Heart failure, unspecified: Secondary | ICD-10-CM | POA: Insufficient documentation

## 2023-07-06 DIAGNOSIS — Z91010 Allergy to peanuts: Secondary | ICD-10-CM | POA: Diagnosis not present

## 2023-07-06 DIAGNOSIS — M542 Cervicalgia: Secondary | ICD-10-CM | POA: Insufficient documentation

## 2023-07-06 DIAGNOSIS — I110 Hypertensive heart disease with heart failure: Secondary | ICD-10-CM | POA: Diagnosis not present

## 2023-07-06 DIAGNOSIS — M62838 Other muscle spasm: Secondary | ICD-10-CM | POA: Insufficient documentation

## 2023-07-06 NOTE — ED Triage Notes (Signed)
Rt shoulder and neck  muscle spasms tonight  hx of the same

## 2023-07-07 ENCOUNTER — Ambulatory Visit: Payer: 59

## 2023-07-07 ENCOUNTER — Encounter: Payer: Self-pay | Admitting: Hematology

## 2023-07-07 MED ORDER — ACETAMINOPHEN 325 MG PO TABS: 650.0000 mg | ORAL_TABLET | Freq: Once | ORAL | Status: DC

## 2023-07-07 MED ORDER — CYCLOBENZAPRINE HCL 10 MG PO TABS: 5.0000 mg | ORAL_TABLET | Freq: Every day | ORAL | 0 refills | Status: AC

## 2023-07-07 MED ORDER — SODIUM CHLORIDE 0.9 % IV SOLN
300.0000 mg | Freq: Once | INTRAVENOUS | Status: DC
  Filled 2023-07-07: qty 15

## 2023-07-07 MED ORDER — DIPHENHYDRAMINE HCL 25 MG PO CAPS: 25.0000 mg | ORAL_CAPSULE | Freq: Once | ORAL | Status: DC

## 2023-07-07 MED ORDER — KETOROLAC TROMETHAMINE 30 MG/ML IJ SOLN
15.0000 mg | Freq: Once | INTRAMUSCULAR | Status: AC
  Administered 2023-07-07: 15 mg via INTRAMUSCULAR
  Filled 2023-07-07: qty 1

## 2023-07-07 NOTE — Discharge Instructions (Addendum)
You likely have a muscle spasm of your right trapezius (neck/shoulder muscle).   Please apply warm compresses/heating packs to the area to help with the pain and muscle stiffness.  You have been prescribed a muscle relaxer called Flexeril (cyclobenzaprine). You may take 1 tablet (5mg ) before bed as needed for muscle pain. This medication can be sedating. Do not drive or operate heavy machinery after taking this medicine. Do not drink alcohol or take other sedating medications when taking this medicine for safety reasons.  Keep this out of reach of small children.  Please follow-up with your PCP if your symptoms have not started to improve in the next week.  Please return to the ER for your pain worsens you have any dizziness, changes in vision, severe headache, any other new or concerning symptoms.

## 2023-07-07 NOTE — ED Provider Notes (Signed)
[place] EMERGENCY DEPARTMENT AT [facility] Provider Note   CSN: [id number] Arrival date & time: 07/06/23  2303     History  Chief Complaint  Patient presents with   Spasms    Jonathan Dickson is a 67 y.o. male with history of anterior cervical decompression/discectomy fusion, hypertension, heart failure, presents with concern for pain on the right side of his neck and shoulder that started last night.  Reports this has happened many times before where his muscle stiffens up. Pain is localized to this area and has not spread anywhere else.  Denies any headache, changes in vision, shortness of breath, dizziness.  HPI     Home Medications Prior to Admission medications   Medication Sig Start Date End Date Taking? Authorizing Provider  cyclobenzaprine (FLEXERIL) 10 MG tablet Take 0.5 tablets (5 mg total) by mouth at bedtime for 7 days. 07/07/23 07/14/23 Yes Arabella Merles, PA-C  acetaminophen (TYLENOL) 500 MG tablet Take 1,000 mg by mouth every 6 (six) hours as needed for moderate pain or headache.    [provider]  albuterol (VENTOLIN HFA) 108 (90 Base) MCG/ACT inhaler INHALE 2 PUFFS INTO THE LUNGS EVERY 6 HOURS AS NEEDED FOR WHEEZING OR SHORTNESS OF BREATH 05/02/23   Parrett, Virgel Bouquet, NP  Aromatic Inhalants (VICKS VAPOINHALER) INHA Inhale 1 Dose into the lungs daily as needed (congestion).    [provider]  atorvastatin (LIPITOR) 80 MG tablet Take 1 tablet (80 mg total) by mouth daily. 02/05/23   Claiborne Rigg, NP  carvedilol (COREG) 12.5 MG tablet Take 1 tablet (12.5 mg total) by mouth 2 (two) times daily with a meal. 11/18/22   Milford, Anderson Malta, FNP  Chlorphen-Phenyleph-ASA (ALKA-SELTZER PLUS COLD PO) Take 1-2 tablets by mouth 2 (two) times daily as needed (cold symptoms).     [provider]  cholecalciferol (VITAMIN D3) 25 MCG (1000 UNIT) tablet Take by mouth. 11/25/17   [provider]  dapagliflozin propanediol (FARXIGA) 10 MG  TABS tablet Take 1 tablet (10 mg total) by mouth daily. 12/03/22   Bensimhon, Bevelyn Buckles, MD  diclofenac Sodium (VOLTAREN) 1 % GEL Apply 2 g topically 4 (four) times daily as needed (pain).    [provider]  enoxaparin (LOVENOX) 80 MG/0.8ML injection Inject 0.8 mLs (80 mg total) into the skin every 12 (twelve) hours. 05/14/23   Runell Gess, MD  fluticasone (FLONASE) 50 MCG/ACT nasal spray SHAKE LIQUID AND USE 1 SPRAY IN EACH NOSTRIL DAILY 04/17/23   Cobb, Ruby Cola, NP  lidocaine (LIDODERM) 5 % Place 1 patch onto the skin daily. Remove & Discard patch within 12 hours or as directed by MD 05/05/23   Sabas Sous, MD  loratadine (CLARITIN) 10 MG tablet Take 1 tablet (10 mg total) by mouth daily. 02/05/23   Claiborne Rigg, NP  metFORMIN (GLUCOPHAGE) 500 MG tablet Take 1 tablet (500 mg total) by mouth daily with breakfast. 02/05/23   Claiborne Rigg, NP  methocarbamol (ROBAXIN) 500 MG tablet Take 1 tablet (500 mg total) by mouth every 8 (eight) hours as needed for muscle spasms. 05/05/23   Sabas Sous, MD  Misc. Devices MISC by Does not apply route. Flutter valve daily    [provider]  sacubitril-valsartan (ENTRESTO) 49-51 MG Take 1 tablet by mouth 2 (two) times daily. 11/18/22   Jacklynn Ganong, FNP  spironolactone (ALDACTONE) 25 MG tablet Take 0.5 tablets (12.5 mg total) by mouth daily. TAKE 1/2 TABLET(12.5 MG)  BY MOUTH DAILY 11/18/22   Milford, Anderson Malta, FNP  Tafamidis [facility]) 61 MG CAPS Take 1 capsule by mouth daily. 11/19/22   Bensimhon, Bevelyn Buckles, MD  warfarin (COUMADIN) 5 MG tablet TAKE 1 TO 2 TABLETS BY MOUTH DAILY AS DIRECTED BY COUMADIN CLINIC 04/11/23   Runell Gess, MD      Allergies    Peanut-containing drug products, Penicillins, Decadron [dexamethasone], and Ivp dye [iodinated contrast media]    Review of Systems   Review of Systems  Musculoskeletal:  Positive for myalgias and neck pain.    Physical Exam Updated Vital Signs BP 133/78   Pulse 79    Temp 97.7 F (36.5 C) (Oral)   Resp 17   Ht 5\' 6"  (1.676 m)   Wt 78.6 kg   SpO2 100%   BMI 27.97 kg/m  Physical Exam Vitals and nursing note reviewed.  Constitutional:      General: He is not in acute distress.    Appearance: Normal appearance.  HENT:     Head: Atraumatic.  Neck:     Comments: No neck rigidity Cardiovascular:     Rate and Rhythm: Normal rate and regular rhythm.     Comments: 2+ radial pulses bilaterally Pulmonary:     Effort: Pulmonary effort is normal.  Musculoskeletal:     Comments: No tenderness palpation along the spinous processes, tender to palpation along the right trapezius muscle, no tenderness to palpation along the left trapezius muscle  Able to rotate neck to the left without difficulty, able to move neck to the right with some pain of the trapezius muscle  Neurological:     General: No focal deficit present.     Mental Status: He is alert.  Psychiatric:        Mood and Affect: Mood normal.        Behavior: Behavior normal.     ED Results / Procedures / Treatments   Labs (all labs ordered are listed, but only abnormal results are displayed) Labs Reviewed - No data to display  EKG None  Radiology No results found.  Procedures Procedures    Medications Ordered in ED Medications  ketorolac (TORADOL) 30 MG/ML injection 15 mg (15 mg Intramuscular Given 07/07/23 0743)    ED Course/ Medical Decision Making/ A&P                                 Medical Decision Making Risk Prescription drug management.   67 y.o. male with pertinent past medical history of anterior cervical decompression/discectomy fusion, hypertension, heart failure presents to the ED for concern of right sided neck pain/muscle stiffness that started yesterday   Differential diagnosis includes but is not limited to muscle spasm, muscle strain, radiculopathy, dissection, trauma  ED Course:  Patient overall well-appearing with normal vital signs.  Reports pain  of the right side of his neck and is tender directly over the right trapezius muscle.  Normal neck range of motion, no neck stiffness. No dizziness, changes in vision, headache, no concern for vascular etiology at this time.  No trauma to the area, no indication for imaging at this time.  Reports this has happened many times in the past and is consistent with previous muscle spasms. Patient given IM Toradol for pain here.  Will discharge with a short course of Flexeril for at home.   Impression: Trapezius muscle spasm  Disposition:  The patient was discharged home  with instructions to apply heating packs to the area, Flexeril at night to help with neck pain, follow-up with PCP if symptoms have not improved within the next week Return precautions given.  External records from outside source obtained and reviewed including CMP from 9//24 with creatinine 1.38   Co morbidities that complicate the patient evaluation  anterior cervical decompression/discectomy fusion, hypertension, heart failure              Final Clinical Impression(s) / ED Diagnoses Final diagnoses:  Trapezius muscle spasm    Rx / DC Orders ED Discharge Orders          Ordered    cyclobenzaprine (FLEXERIL) 10 MG tablet  Daily at bedtime        07/07/23 0754              Arabella Merles, PA-C 07/07/23 0805    Derwood Kaplan, MD 07/08/23 [phone]

## 2023-07-10 ENCOUNTER — Telehealth: Payer: Self-pay | Admitting: *Deleted

## 2023-07-10 NOTE — Progress Notes (Signed)
Remote ICD transmission.   

## 2023-07-10 NOTE — Telephone Encounter (Signed)
This patient is scheduled for his procedure to be done on 10/28 at [facility].

## 2023-07-10 NOTE — Telephone Encounter (Signed)
Jonathan Dickson,  This pt's cardiac EF is below the [place] standard.  Her procedure will need to be done at the hospital.  Thanks,  Osvaldo Angst

## 2023-07-14 ENCOUNTER — Ambulatory Visit (AMBULATORY_SURGERY_CENTER): Payer: 59

## 2023-07-14 ENCOUNTER — Ambulatory Visit: Payer: 59

## 2023-07-14 VITALS — Ht 66.0 in | Wt 170.0 lb

## 2023-07-14 DIAGNOSIS — D649 Anemia, unspecified: Secondary | ICD-10-CM

## 2023-07-14 DIAGNOSIS — Z860101 Personal history of adenomatous and serrated colon polyps: Secondary | ICD-10-CM

## 2023-07-14 MED ORDER — NA SULFATE-K SULFATE-MG SULF 17.5-3.13-1.6 GM/177ML PO SOLN: 1.0000 | Freq: Once | ORAL | 0 refills | Status: AC

## 2023-07-14 NOTE — Progress Notes (Signed)
Pre visit completed via phone call; Patient verified name, DOB, and address; No egg or soy allergy known to patient;  No issues known to pt with past sedation with any surgeries or procedures; Patient denies ever being told they had issues or difficulty with intubation;  No FH of Malignant Hyperthermia; Pt is not on diet pills; Pt is not on home 02;  Pt IS on blood thinners- coumadin hold x 5 days with lovenox bridge per MD Pt denies issues with constipation;  No A fib or A flutter; Have any cardiac testing pending--NO Insurance verified during PV appt--- [facility] Medicare Pt can ambulate without assistance;  Pt denies use of chewing tobacco; Discussed diabetic/weight loss medication holds; Discussed NSAID holds; Checked BMI to be less than 50; Pt instructed to use Single[employer]com or GoodRx for a price reduction on prep;  Patient's chart reviewed by Cathlyn Parsons CNRA prior to previsit and patient appropriate for the [facility];  Pre visit completed and red dot placed by patient's name on their procedure day (on provider's schedule)    Instructions printed and given to patient at time of PV appt;

## 2023-07-16 ENCOUNTER — Ambulatory Visit: Payer: 59

## 2023-07-16 VITALS — BP 124/76 | HR 89 | Temp 97.7°F | Resp 20 | Ht 66.0 in | Wt 170.2 lb

## 2023-07-16 DIAGNOSIS — D509 Iron deficiency anemia, unspecified: Secondary | ICD-10-CM

## 2023-07-16 MED ORDER — SODIUM CHLORIDE 0.9 % IV SOLN
300.0000 mg | Freq: Once | INTRAVENOUS | Status: AC
  Administered 2023-07-16: 300 mg via INTRAVENOUS
  Filled 2023-07-16: qty 15

## 2023-07-16 MED ORDER — ACETAMINOPHEN 325 MG PO TABS: 650.0000 mg | ORAL_TABLET | Freq: Once | ORAL | Status: DC

## 2023-07-16 MED ORDER — DIPHENHYDRAMINE HCL 25 MG PO CAPS
25.0000 mg | ORAL_CAPSULE | Freq: Once | ORAL | Status: AC
  Administered 2023-07-16: 25 mg via ORAL
  Filled 2023-07-16: qty 1

## 2023-07-16 NOTE — Progress Notes (Signed)
Diagnosis: Iron Deficiency Anemia  Provider:  Chilton Greathouse MD  Procedure: IV Infusion  IV Type: Peripheral, IV Location: L Hand  Venofer (Iron Sucrose), Dose: 300 mg  Infusion Start Time: 0948  Infusion Stop Time: 1126  Post Infusion IV Care: Observation period completed and Peripheral IV Discontinued  Discharge: Condition: Good, Destination: Home . AVS Provided  Performed by:  Garnette Czech, RN

## 2023-07-21 ENCOUNTER — Other Ambulatory Visit: Payer: Self-pay

## 2023-07-21 ENCOUNTER — Encounter: Payer: Self-pay | Admitting: Cardiology

## 2023-07-21 ENCOUNTER — Ambulatory Visit: Payer: 59 | Attending: Cardiology

## 2023-07-21 ENCOUNTER — Encounter (HOSPITAL_COMMUNITY): Payer: Self-pay | Admitting: Gastroenterology

## 2023-07-21 DIAGNOSIS — I824Y9 Acute embolism and thrombosis of unspecified deep veins of unspecified proximal lower extremity: Secondary | ICD-10-CM | POA: Diagnosis not present

## 2023-07-21 DIAGNOSIS — Z7901 Long term (current) use of anticoagulants: Secondary | ICD-10-CM | POA: Diagnosis not present

## 2023-07-21 LAB — POCT INR: INR: 3 (ref 2.0–3.0)

## 2023-07-21 MED ORDER — ENOXAPARIN SODIUM 80 MG/0.8ML IJ SOSY: 80.0000 mg | PREFILLED_SYRINGE | Freq: Two times a day (BID) | INTRAMUSCULAR | 1 refills | Status: DC

## 2023-07-21 NOTE — Progress Notes (Signed)
PERIOPERATIVE PRESCRIPTION FOR IMPLANTED CARDIAC DEVICE PROGRAMMING  Patient Information: Name:  BRAYZEN WOJNAROWSKI  DOB:  1956/05/07  MRN:  [id number]    Planned Procedure:  Colonoscopy and Endoscopy  Surgeon:  Dr. Adela Lank  Date of Procedure:  07-28-23  Cautery will be used.  Position during surgery:  Lateral   Please send documentation back to:  [facility] (Fax # [phone])  Device Information:  Clinic EP Physician:  Loman Brooklyn, MD   Device Type:  Defibrillator Manufacturer and Phone #:  [employer]: [phone] Pacemaker Dependent?:  No. Date of Last Device Check:  06/24/23 Normal Device Function?:  Yes.    Electrophysiologist's Recommendations:  Have magnet available. Provide continuous ECG monitoring when magnet is used or reprogramming is to be performed.  Procedure may interfere with device function.  Magnet should be placed over device during procedure.  Per Device Clinic Standing Orders, Lenor Coffin, RN  10:52 AM 07/21/2023

## 2023-07-21 NOTE — Patient Instructions (Signed)
Continue taking warfarin 1 tablet (5mg ) daily, except 2 tablets (10mg ) on Monday, Wednesday and Friday. Colonoscopy 10/28, needs Lovenox Bridging, HOLDING 10/23-10/27 Recheck INR in 3 WEEKS  (normally 6 weeks).  Coumadin Clinic [phone] Procedure Clearance Fax [phone] or [phone]  10/22: Last dose of warfarin.  10/23: No warfarin or enoxaparin (Lovenox).  10/24: Inject enoxaparin 80mg  in the fatty abdominal tissue at least 2 inches from the belly button twice a day about 12 hours apart, 8am and 8pm rotate sites. No warfarin.  10/25: Inject enoxaparin in the fatty tissue every 12 hours, 8am and 8pm. No warfarin.  10/26: Inject enoxaparin in the fatty tissue every 12 hours, 8am and 8pm. No warfarin.  10/27: Inject enoxaparin in the fatty tissue in the morning at 8 am (No PM dose). No warfarin.  10/28: Procedure Day - No enoxaparin - Resume warfarin in the evening or as directed by doctor (take an extra half tablet with usual dose for 2 days then resume normal dose).  10/29: Resume enoxaparin inject in the fatty tissue every 12 hours and take warfarin  10/30: Inject enoxaparin in the fatty tissue every 12 hours and take warfarin  10/31: Inject enoxaparin in the fatty tissue every 12 hours and take warfarin  11/1: Inject enoxaparin in the fatty tissue every 12 hours and take warfarin  11/2 AND 11/3: Inject enoxaparin in the fatty tissue every 12 hours and take warfarin  11/4: warfarin appt to check INR.

## 2023-07-21 NOTE — Progress Notes (Signed)
COVID Vaccine Completed:  Yes  Date of COVID positive in last 90 days :No  PCP - Bertram Denver, NP Cardiologist - Nanetta Batty, MD Electrophysiologist - Loman Brooklyn, MD Advanced Heart Failure - Arvilla Meres, MD Pulmonologist - Rubye Oaks, NP  Chest x-ray -  EKG - 11-08-22 Epic Stress Test - 04-03-18 Epic ECHO - 05-09-21 Epic Cardiac Cath - 03-11-16 Epic Pacemaker/ICD device last checked: 06-24-23.  Device orders on chart Spinal Cord Stimulator: MR Cardiac - 04-01-18 Epic  Bowel Prep -  Patient has prep and instructions  Sleep Study - N/A CPAP -   Fasting Blood Sugar -  Checks Blood Sugar  do not check  times a day  Last dose of GLP1 agonist-  N/A GLP1 instructions:  N/A   Marcelline Deist Last dose of SGLT-2 inhibitors-  07-24-23 SGLT-2 instructions: Hold 3 days before procedure    Blood Thinner Instructions:  Coumadin.  Per patient to take last dose on 07-22-23 and start Lovenox bridge.  Aspirin Instructions: Last Dose:  Activity level:  Can go up a flight of stairs and perform activities of daily living without stopping and without symptoms of chest pain or shortness of breath.  Anesthesia review:  AICD, CHF, cardiac amyloidosis, cardiomyopathy, DM, HTN, hx of seizure with PCN (no recent seizure activity)  Patient denies shortness of breath, fever, cough and chest pain at PAT appointment (completed over the phone)  Patient verbalized understanding of instructions that were given to them at the PAT appointment. Patient was also instructed that they will need to review over the PAT instructions again at home before surgery.

## 2023-07-23 ENCOUNTER — Other Ambulatory Visit: Payer: Self-pay

## 2023-07-23 ENCOUNTER — Encounter (HOSPITAL_COMMUNITY): Payer: Self-pay

## 2023-07-23 NOTE — Progress Notes (Signed)
Specialty Pharmacy Ongoing Clinical Assessment Note  Jonathan Dickson is a 67 y.o. male who is being followed by the specialty pharmacy service for RxSp Cardiology   Patient's specialty medication(s) reviewed today: Tafamidis   Missed doses in the last 4 weeks: 0   Patient/Caregiver did not have any additional questions or concerns.   Therapeutic benefit summary: Unable to assess   Adverse events/side effects summary: No adverse events/side effects   Patient's therapy is appropriate to: Continue    Goals Addressed             This Visit's Progress    Stabilization of disease       Patient is on track. Patient will maintain adherence         Follow up:  6 months  Bobette Mo Specialty Pharmacist

## 2023-07-23 NOTE — Progress Notes (Signed)
Specialty Pharmacy Refill Coordination Note  Jonathan Dickson is a 67 y.o. male contacted today regarding refills of specialty medication(s) Tafamidis   Patient requested Delivery   Delivery date: 07/25/23   Verified address: [address]   Medication will be filled on 07/24/23.

## 2023-07-24 ENCOUNTER — Ambulatory Visit: Payer: 59

## 2023-07-24 VITALS — BP 110/70 | HR 77 | Temp 97.7°F | Resp 18 | Ht 66.0 in | Wt 169.0 lb

## 2023-07-24 DIAGNOSIS — D509 Iron deficiency anemia, unspecified: Secondary | ICD-10-CM

## 2023-07-24 MED ORDER — ACETAMINOPHEN 325 MG PO TABS
650.0000 mg | ORAL_TABLET | Freq: Once | ORAL | Status: AC
  Administered 2023-07-24: 650 mg via ORAL
  Filled 2023-07-24: qty 2

## 2023-07-24 MED ORDER — IRON SUCROSE 300 MG IVPB - SIMPLE MED
300.0000 mg | Freq: Once | Status: AC
  Administered 2023-07-24: 300 mg via INTRAVENOUS

## 2023-07-24 MED ORDER — DIPHENHYDRAMINE HCL 25 MG PO CAPS
25.0000 mg | ORAL_CAPSULE | Freq: Once | ORAL | Status: AC
  Administered 2023-07-24: 25 mg via ORAL
  Filled 2023-07-24: qty 1

## 2023-07-24 NOTE — Addendum Note (Signed)
Addended by: Salli Real R on: 07/24/2023 01:18 PM   Modules accepted: Orders

## 2023-07-24 NOTE — Progress Notes (Signed)
Diagnosis: Iron Deficiency Anemia  Provider:  Chilton Greathouse MD  Procedure: IV Infusion  IV Type: Peripheral, IV Location: L Hand  Venofer (Iron Sucrose), Dose: 300 mg  Infusion Start Time: 1126  Infusion Stop Time: 1300  Post Infusion IV Care: Peripheral IV Discontinued  Discharge: Condition: Good, Destination: Home . AVS Declined  Performed by:  Rico Ala, LPN

## 2023-07-28 ENCOUNTER — Ambulatory Visit (HOSPITAL_BASED_OUTPATIENT_CLINIC_OR_DEPARTMENT_OTHER): Payer: 59 | Admitting: Physician Assistant

## 2023-07-28 ENCOUNTER — Ambulatory Visit (HOSPITAL_COMMUNITY)
Admission: RE | Admit: 2023-07-28 | Discharge: 2023-07-28 | Disposition: A | Discharge status: Home / Self Care | Payer: 59 | Attending: Gastroenterology | Admitting: Gastroenterology

## 2023-07-28 ENCOUNTER — Other Ambulatory Visit: Payer: Self-pay

## 2023-07-28 ENCOUNTER — Encounter (HOSPITAL_COMMUNITY)
Admission: RE | Disposition: A | Discharge status: Home / Self Care | Payer: Self-pay | Source: Home / Self Care | Attending: Gastroenterology

## 2023-07-28 ENCOUNTER — Ambulatory Visit (HOSPITAL_COMMUNITY): Payer: 59 | Admitting: Physician Assistant

## 2023-07-28 DIAGNOSIS — K5730 Diverticulosis of large intestine without perforation or abscess without bleeding: Secondary | ICD-10-CM | POA: Diagnosis not present

## 2023-07-28 DIAGNOSIS — I2510 Atherosclerotic heart disease of native coronary artery without angina pectoris: Secondary | ICD-10-CM | POA: Diagnosis not present

## 2023-07-28 DIAGNOSIS — K529 Noninfective gastroenteritis and colitis, unspecified: Secondary | ICD-10-CM

## 2023-07-28 DIAGNOSIS — Z95810 Presence of automatic (implantable) cardiac defibrillator: Secondary | ICD-10-CM | POA: Insufficient documentation

## 2023-07-28 DIAGNOSIS — E119 Type 2 diabetes mellitus without complications: Secondary | ICD-10-CM | POA: Insufficient documentation

## 2023-07-28 DIAGNOSIS — Z86718 Personal history of other venous thrombosis and embolism: Secondary | ICD-10-CM | POA: Diagnosis not present

## 2023-07-28 DIAGNOSIS — B9681 Helicobacter pylori [H. pylori] as the cause of diseases classified elsewhere: Secondary | ICD-10-CM

## 2023-07-28 DIAGNOSIS — Z860101 Personal history of adenomatous and serrated colon polyps: Secondary | ICD-10-CM

## 2023-07-28 DIAGNOSIS — K2950 Unspecified chronic gastritis without bleeding: Secondary | ICD-10-CM | POA: Diagnosis not present

## 2023-07-28 DIAGNOSIS — K635 Polyp of colon: Secondary | ICD-10-CM

## 2023-07-28 DIAGNOSIS — Z7901 Long term (current) use of anticoagulants: Secondary | ICD-10-CM | POA: Diagnosis not present

## 2023-07-28 DIAGNOSIS — I252 Old myocardial infarction: Secondary | ICD-10-CM | POA: Insufficient documentation

## 2023-07-28 DIAGNOSIS — K449 Diaphragmatic hernia without obstruction or gangrene: Secondary | ICD-10-CM

## 2023-07-28 DIAGNOSIS — K3189 Other diseases of stomach and duodenum: Secondary | ICD-10-CM | POA: Diagnosis not present

## 2023-07-28 DIAGNOSIS — D126 Benign neoplasm of colon, unspecified: Secondary | ICD-10-CM

## 2023-07-28 DIAGNOSIS — I43 Cardiomyopathy in diseases classified elsewhere: Secondary | ICD-10-CM | POA: Diagnosis not present

## 2023-07-28 DIAGNOSIS — D649 Anemia, unspecified: Secondary | ICD-10-CM

## 2023-07-28 DIAGNOSIS — Z7984 Long term (current) use of oral hypoglycemic drugs: Secondary | ICD-10-CM | POA: Insufficient documentation

## 2023-07-28 DIAGNOSIS — Z87891 Personal history of nicotine dependence: Secondary | ICD-10-CM | POA: Diagnosis not present

## 2023-07-28 DIAGNOSIS — D509 Iron deficiency anemia, unspecified: Secondary | ICD-10-CM | POA: Insufficient documentation

## 2023-07-28 DIAGNOSIS — I5042 Chronic combined systolic (congestive) and diastolic (congestive) heart failure: Secondary | ICD-10-CM | POA: Insufficient documentation

## 2023-07-28 DIAGNOSIS — Z09 Encounter for follow-up examination after completed treatment for conditions other than malignant neoplasm: Secondary | ICD-10-CM

## 2023-07-28 DIAGNOSIS — K319 Disease of stomach and duodenum, unspecified: Secondary | ICD-10-CM

## 2023-07-28 DIAGNOSIS — I110 Hypertensive heart disease with heart failure: Secondary | ICD-10-CM | POA: Insufficient documentation

## 2023-07-28 DIAGNOSIS — K648 Other hemorrhoids: Secondary | ICD-10-CM | POA: Insufficient documentation

## 2023-07-28 HISTORY — PX: ESOPHAGOGASTRODUODENOSCOPY (EGD) WITH PROPOFOL: SHX5813

## 2023-07-28 HISTORY — PX: COLONOSCOPY WITH PROPOFOL: SHX5780

## 2023-07-28 HISTORY — PX: BIOPSY: SHX5522

## 2023-07-28 HISTORY — PX: POLYPECTOMY: SHX5525

## 2023-07-28 HISTORY — PX: HOT HEMOSTASIS: SHX5433

## 2023-07-28 LAB — GLUCOSE, CAPILLARY: Glucose-Capillary: 98 mg/dL (ref 70–99)

## 2023-07-28 SURGERY — COLONOSCOPY WITH PROPOFOL
Anesthesia: Monitor Anesthesia Care

## 2023-07-28 MED ORDER — LIDOCAINE 2% (20 MG/ML) 5 ML SYRINGE
INTRAMUSCULAR | Status: DC | PRN
  Administered 2023-07-28: 50 mg via INTRAVENOUS

## 2023-07-28 MED ORDER — OMEPRAZOLE MAGNESIUM 20 MG PO TBEC: 20.0000 mg | DELAYED_RELEASE_TABLET | Freq: Every day | ORAL | 1 refills | Status: DC

## 2023-07-28 MED ORDER — SODIUM CHLORIDE 0.9 % IV SOLN: INTRAVENOUS | Status: DC

## 2023-07-28 MED ORDER — PROPOFOL 10 MG/ML IV BOLUS
INTRAVENOUS | Status: DC | PRN
  Administered 2023-07-28: 20 mg via INTRAVENOUS

## 2023-07-28 MED ORDER — PROPOFOL 1000 MG/100ML IV EMUL
INTRAVENOUS | Status: AC
  Filled 2023-07-28: qty 100

## 2023-07-28 MED ORDER — PROPOFOL 500 MG/50ML IV EMUL
INTRAVENOUS | Status: DC | PRN
  Administered 2023-07-28: 150 ug/kg/min via INTRAVENOUS

## 2023-07-28 SURGICAL SUPPLY — 25 items
BLOCK BITE 60FR ADLT L/F BLUE (MISCELLANEOUS) ×2 IMPLANT
ELECT REM PT RETURN 9FT ADLT (ELECTROSURGICAL)
ELECTRODE REM PT RTRN 9FT ADLT (ELECTROSURGICAL) IMPLANT
FCP BXJMBJMB 240X2.8X (CUTTING FORCEPS)
FLOOR PAD 36X40 (MISCELLANEOUS) ×2
FORCEP RJ3 GP 1.8X160 W-NEEDLE (CUTTING FORCEPS) IMPLANT
FORCEPS BIOP RAD 4 LRG CAP 4 (CUTTING FORCEPS) IMPLANT
FORCEPS BIOP RJ4 240 W/NDL (CUTTING FORCEPS)
FORCEPS BXJMBJMB 240X2.8X (CUTTING FORCEPS) IMPLANT
INJECTOR/SNARE I SNARE (MISCELLANEOUS) IMPLANT
LUBRICANT JELLY 4.5OZ STERILE (MISCELLANEOUS) IMPLANT
MANIFOLD NEPTUNE II (INSTRUMENTS) IMPLANT
NDL SCLEROTHERAPY 25GX240 (NEEDLE) IMPLANT
NEEDLE SCLEROTHERAPY 25GX240 (NEEDLE)
PAD FLOOR 36X40 (MISCELLANEOUS) ×2 IMPLANT
PROBE APC STR FIRE (PROBE) IMPLANT
PROBE INJECTION GOLD (MISCELLANEOUS)
PROBE INJECTION GOLD 7FR (MISCELLANEOUS) IMPLANT
SNARE ROTATE MED OVAL 20MM (MISCELLANEOUS) IMPLANT
SNARE SHORT THROW 13M SML OVAL (MISCELLANEOUS) IMPLANT
SYR 50ML LL SCALE MARK (SYRINGE) IMPLANT
TRAP SPECIMEN MUCOUS 40CC (MISCELLANEOUS) IMPLANT
TUBING ENDO SMARTCAP PENTAX (MISCELLANEOUS) ×4 IMPLANT
TUBING IRRIGATION ENDOGATOR (MISCELLANEOUS) ×2 IMPLANT
WATER STERILE IRR 1000ML POUR (IV SOLUTION) IMPLANT

## 2023-07-28 NOTE — Anesthesia Postprocedure Evaluation (Signed)
Anesthesia Post Note  Patient: Jonathan Dickson  Procedure(s) Performed: COLONOSCOPY WITH PROPOFOL ESOPHAGOGASTRODUODENOSCOPY (EGD) WITH PROPOFOL BIOPSY HOT HEMOSTASIS (ARGON PLASMA COAGULATION/BICAP) POLYPECTOMY     Patient location during evaluation: PACU Anesthesia Type: MAC Level of consciousness: awake and alert Pain management: pain level controlled Vital Signs Assessment: post-procedure vital signs reviewed and stable Respiratory status: spontaneous breathing, nonlabored ventilation, respiratory function stable and patient connected to nasal cannula oxygen Cardiovascular status: stable and blood pressure returned to baseline Postop Assessment: no apparent nausea or vomiting Anesthetic complications: no   No notable events documented.  Last Vitals:  Vitals:   07/28/23 1143 07/28/23 1150  BP:  (!) 142/71  Pulse:  98  Resp:  (!) 31  Temp: 36.6 C   SpO2:  100%    Last Pain:  Vitals:   07/28/23 1150  TempSrc:   PainSc: 0-No pain                 Mariann Barter

## 2023-07-28 NOTE — Discharge Instructions (Signed)

## 2023-07-28 NOTE — Anesthesia Procedure Notes (Signed)
Procedure Name: MAC Date/Time: 07/28/2023 10:29 AM  Performed by: Elisabeth Cara, CRNAPre-anesthesia Checklist: Patient identified, Emergency Drugs available, Suction available, Patient being monitored and Timeout performed Patient Re-evaluated:Patient Re-evaluated prior to induction Oxygen Delivery Method: Simple face mask Placement Confirmation: positive ETCO2 Dental Injury: Teeth and Oropharynx as per pre-operative assessment

## 2023-07-28 NOTE — H&P (Signed)
Jonathan Dickson History and Physical   Primary Care Physician:  Jonathan Rigg, NP   Reason for Procedure:   Iron deficiency anemia, history of colon polyps  Plan:    EGD and colonoscopy     HPI: Jonathan Dickson is a 67 y.o. male  here for EGD and colonoscopy to evaluate history of iron deficiency. He also has had a history of adenoma removed 04/2016. Patient denies any bowel symptoms at this time. Otherwise feels well without any cardiopulmonary symptoms. His case is being done at the hospital for anesthesia support given EF of 30-35%, history of cardiac amyloidosis.   On coumadin at baseline, history of DVT, which has been held for 5 days and on a lovenox bridge, his last dose was  yesterday AM. He tolerated the prep okay. He otherwise feels well today without complaints.   I have discussed risks / benefits of anesthesia and endoscopic procedure with Jonathan Dickson and they wish to proceed with the exams as outlined today.    Past Medical History:  Diagnosis Date   Abnormal TSH 02/2016   AICD (automatic cardioverter/defibrillator) present    Anxiety    Blood clotting disorder (HCC)    Bronchiectasis (HCC)    left lower lung   Cardiac amyloidosis (HCC)    Cervical disc disease    Chronic combined systolic and diastolic CHF (congestive heart failure) (HCC)    a. 01/2016 Echo: EF 25%, inf AK, diffuse sev HK, Gr1 DD, mild MR.   Coronary atherosclerosis    Depression    Diabetes mellitus without complication (HCC)    Dyspnea    Dysrhythmia    Full thickness rotator cuff tear 06/2021   MRI   History of DVT (deep vein thrombosis) 08/04/2019   venous doppler US   History of hiatal hernia    Hx of adenomatous polyp of colon    Hyperglycemia    Hyperlipidemia    Hypertension    Hypertensive heart disease    Myocardial infarction [facility])    ??  maybe   NICM (nonischemic cardiomyopathy) (HCC)    a. 01/2016 Echo: EF 25%, inf AK, diffuse sev HK, Gr1 DD, mild MR; b. 01/2016 MV:  EF 22%, no isch/infarct;  c. 02/2016 Cath: Nl cors, EF 35-45%.   Osteoarthritis of hips, bilateral    Paraseptal emphysema (HCC)    mild   Peanut allergy    Pneumonia    Pre-diabetes    Presence of implantable cardioverter-defibrillator (ICD)    S/P repair of ventral hernia    Seizures (HCC)    with penicillin   Sensorineural hearing loss     Past Surgical History:  Procedure Laterality Date   ANTERIOR CERVICAL DECOMP/DISCECTOMY FUSION N/A 07/10/2016   Procedure: ANTERIOR CERVICAL DECOMPRESSION FUSION CERVICAL 4-5, CERVICAL 5-6, CERVICAL 6-7 WITH INSTRUMENTATION AND ALLOGRAFT;  Surgeon: Estill Bamberg, MD;  Location: [facility] OR;  Service: Orthopedics;  Laterality: N/A;   ANTERIOR CERVICAL DECOMP/DISCECTOMY FUSION N/A 04/19/2021   Procedure: ANTERIOR CERVICAL DECOMPRESSION FUSION CERVICAL 3- CERVICAL 4 WITH INSTRUMENTATION AND ALLOGRAFT;  Surgeon: Estill Bamberg, MD;  Location: [facility] OR;  Service: Orthopedics;  Laterality: N/A;   CARDIAC CATHETERIZATION N/A 03/11/2016   Procedure: Left Heart Cath and Coronary Angiography;  Surgeon: Marykay Lex, MD;  Location: [facility] INVASIVE CV LAB;  Service: Cardiovascular;  Laterality: N/A;   COLONOSCOPY     ICD IMPLANT N/A 06/17/2018   Procedure: ICD IMPLANT;  Surgeon: Regan Lemming, MD;  Location: [facility] INVASIVE CV LAB;  Service: Cardiovascular;  Laterality: N/A;   Thumb surgery Right    VENTRAL HERNIA REPAIR N/A 10/25/2019   Procedure: PRIMARY VENTRAL HERNIA REPAIR;  Surgeon: Berna Bue, MD;  Location: [facility] ORS;  Service: General;  Laterality: N/A;    Prior to Admission medications   Medication Sig Start Date End Date Taking? Authorizing Provider  acetaminophen (TYLENOL) 500 MG tablet Take 1,000 mg by mouth every 6 (six) hours as needed for moderate pain or headache.   Yes [provider]  albuterol (VENTOLIN HFA) 108 (90 Base) MCG/ACT inhaler INHALE 2 PUFFS INTO THE LUNGS EVERY 6 HOURS AS NEEDED FOR WHEEZING OR SHORTNESS OF BREATH 05/02/23   Yes Parrett, Tammy S, NP  Aromatic Inhalants (VICKS VAPOINHALER) INHA Inhale 1 Dose into the lungs daily as needed (congestion).   Yes [provider]  atorvastatin (LIPITOR) 80 MG tablet Take 1 tablet (80 mg total) by mouth daily. 02/05/23  Yes Jonathan Rigg, NP  carvedilol (COREG) 12.5 MG tablet Take 1 tablet (12.5 mg total) by mouth 2 (two) times daily with a meal. 11/18/22  Yes Milford, Anderson Malta, FNP  Chlorphen-Phenyleph-ASA (ALKA-SELTZER PLUS COLD PO) Take 1-2 tablets by mouth 2 (two) times daily as needed (cold symptoms).    Yes [provider]  cholecalciferol (VITAMIN D3) 25 MCG (1000 UNIT) tablet Take 1,000 Units by mouth daily. 11/25/17  Yes [provider]  enoxaparin (LOVENOX) 80 MG/0.8ML injection Inject 0.8 mLs (80 mg total) into the skin every 12 (twelve) hours. 05/14/23  Yes Runell Gess, MD  enoxaparin (LOVENOX) 80 MG/0.8ML injection Inject 0.8 mLs (80 mg total) into the skin every 12 (twelve) hours. 07/21/23  Yes Runell Gess, MD  fluticasone (FLONASE) 50 MCG/ACT nasal spray SHAKE LIQUID AND USE 1 SPRAY IN EACH NOSTRIL DAILY 04/17/23  Yes Cobb, Ruby Cola, NP  gabapentin (NEURONTIN) 300 MG capsule Take 300 mg by mouth daily as needed. 07/07/23  Yes [provider]  loratadine (CLARITIN) 10 MG tablet Take 1 tablet (10 mg total) by mouth daily. 02/05/23  Yes Jonathan Rigg, NP  metFORMIN (GLUCOPHAGE) 500 MG tablet Take 1 tablet (500 mg total) by mouth daily with breakfast. 02/05/23  Yes Jonathan Rigg, NP  sacubitril-valsartan (ENTRESTO) 49-51 MG Take 1 tablet by mouth 2 (two) times daily. 11/18/22  Yes Milford, Anderson Malta, FNP  spironolactone (ALDACTONE) 25 MG tablet Take 0.5 tablets (12.5 mg total) by mouth daily. TAKE 1/2 TABLET(12.5 MG) BY MOUTH DAILY 11/18/22  Yes Milford, Jessica M, FNP  Tafamidis (VYNDAMAX) 61 MG CAPS Take 1 capsule by mouth daily. 11/19/22  Yes Bensimhon, Bevelyn Buckles, MD  tiZANidine (ZANAFLEX) 4 MG capsule Take 4 mg by  mouth daily as needed. 07/10/23  Yes [provider]  dapagliflozin propanediol (FARXIGA) 10 MG TABS tablet Take 1 tablet (10 mg total) by mouth daily. 12/03/22   Bensimhon, Bevelyn Buckles, MD  diclofenac Sodium (VOLTAREN) 1 % GEL Apply 2 g topically 4 (four) times daily as needed (pain).    [provider]  lidocaine (LIDODERM) 5 % Place 1 patch onto the skin daily. Remove & Discard patch within 12 hours or as directed by MD Patient not taking: Reported on 07/14/2023 05/05/23   Sabas Sous, MD  methocarbamol (ROBAXIN) 500 MG tablet Take 1 tablet (500 mg total) by mouth every 8 (eight) hours as needed for muscle spasms. 05/05/23   Sabas Sous, MD  Misc. Devices MISC by Does not apply route. Flutter valve daily  [provider]  warfarin (COUMADIN) 5 MG tablet TAKE 1 TO 2 TABLETS BY MOUTH DAILY AS DIRECTED BY COUMADIN CLINIC 04/11/23   Runell Gess, MD    Current Facility-Administered Medications  Medication Dose Route Frequency Provider Last Rate Last Admin   0.9 %  sodium chloride infusion   Intravenous Continuous Kelli Robeck, Willaim Rayas, MD        Allergies as of 05/02/2023 - Review Complete 04/24/2023  Allergen Reaction Noted   Peanut-containing drug products Swelling 09/27/2014   Penicillins Other (See Comments) 10/22/2012   Decadron [dexamethasone] Itching 07/12/2016   Ivp dye [iodinated contrast media]  01/29/2022    Family History  Problem Relation Age of Onset   Diabetes Mother    Diabetes Sister    Clotting disorder Neg Hx    Lung disease Neg Hx    Colon cancer Neg Hx    Esophageal cancer Neg Hx    Stomach cancer Neg Hx    Pancreatic cancer Neg Hx    Colon polyps Neg Hx    Rectal cancer Neg Hx     Social History   Socioeconomic History   Marital status: Legally Separated    Spouse name: Not on file   Number of children: 3   Years of education: Not on file   Highest education level: 10th grade  Occupational History   Not on file   Tobacco Use   Smoking status: Former    Current packs/day: 0.00    Average packs/day: 1.5 packs/day for 18.0 years (27.0 ttl pk-yrs)    Types: Cigarettes    Start date: 03/14/1985    Quit date: 03/15/2003    Years since quitting: 20.3   Smokeless tobacco: Never  Vaping Use   Vaping status: Never Used  Substance and Sexual Activity   Alcohol use: No    Comment: none since 2004   Drug use: No    Comment: former  none since 2004   Sexual activity: Not Currently  Other Topics Concern   Not on file  Social History Narrative   Not on file   Social Determinants of Health   Financial Resource Strain: Low Risk  (05/06/2023)   Overall Financial Resource Strain (CARDIA)    Difficulty of Paying Living Expenses: Not hard at all  Food Insecurity: No Food Insecurity (05/06/2023)   Hunger Vital Sign    Worried About Running Out of Food in the Last Year: Never true    Ran Out of Food in the Last Year: Never true  Transportation Needs: No Transportation Needs (05/06/2023)   PRAPARE - Administrator, Civil Service (Medical): No    Lack of Transportation (Non-Medical): No  Physical Activity: Insufficiently Active (05/06/2023)   Exercise Vital Sign    Days of Exercise per Week: 3 days    Minutes of Exercise per Session: 30 min  Stress: No Stress Concern Present (05/06/2023)   [employer] of Occupational Health - Occupational Stress Questionnaire    Feeling of Stress : Not at all  Social Connections: Moderately Isolated (05/06/2023)   Social Connection and Isolation Panel [NHANES]    Frequency of Communication with Friends and Family: More than three times a week    Frequency of Social Gatherings with Friends and Family: Once a week    Attends Religious Services: 1 to 4 times per year    Active Member of [employer] or Organizations: No    Attends Banker Meetings: Never    Marital Status:  Widowed  Intimate Partner Violence: Not At Risk (05/06/2023)   Humiliation, Afraid, Rape,  and Kick questionnaire    Fear of Current or Ex-Partner: No    Emotionally Abused: No    Physically Abused: No    Sexually Abused: No    Review of Systems: All other review of systems negative except as mentioned in the HPI.  Physical Exam: Vital signs BP (!) 150/81   Pulse 97   Temp (!) 97.3 F (36.3 C) (Temporal)   Resp (!) 22   Ht 5\' 6"  (1.676 m)   Wt 76.7 kg   SpO2 99%   BMI 27.28 kg/m   General:   Alert,  Well-developed, pleasant and cooperative in NAD Lungs:  Clear throughout to auscultation.   Heart:  Regular rate and rhythm Abdomen:  Soft, nontender and nondistended.   Neuro/Psych:  Alert and cooperative. Normal mood and affect. A and O x 3  Harlin Rain, MD [facility]

## 2023-07-28 NOTE — Transfer of Care (Signed)
Immediate Anesthesia Transfer of Care Note  Patient: MOHANNAD BERRIGAN  Procedure(s) Performed: COLONOSCOPY WITH PROPOFOL ESOPHAGOGASTRODUODENOSCOPY (EGD) WITH PROPOFOL BIOPSY HOT HEMOSTASIS (ARGON PLASMA COAGULATION/BICAP) POLYPECTOMY  Patient Location: PACU and Endoscopy Unit  Anesthesia Type:MAC  Level of Consciousness: awake, alert , oriented, and patient cooperative  Airway & Oxygen Therapy: Patient Spontanous Breathing and Patient connected to face mask oxygen  Post-op Assessment: Report given to RN, Post -op Vital signs reviewed and stable, and Patient moving all extremities  Post vital signs: Reviewed and stable  Last Vitals:  Vitals Value Taken Time  BP 139/86 07/28/23 1120  Temp    Pulse 49 07/28/23 1124  Resp 40 07/28/23 1124  SpO2 92 % 07/28/23 1124  Vitals shown include unfiled device data.  Last Pain:  Vitals:   07/28/23 0857  TempSrc: Temporal  PainSc: 0-No pain         Complications: No notable events documented.

## 2023-07-28 NOTE — Anesthesia Preprocedure Evaluation (Addendum)
Anesthesia Evaluation  Patient identified by MRN, date of birth, ID band Patient awake    Reviewed: Allergy & Precautions, NPO status , Patient's Chart, lab work & pertinent test results, reviewed documented beta blocker date and time   History of Anesthesia Complications Negative for: history of anesthetic complications  Airway Mallampati: II  TM Distance: >3 FB     Dental no notable dental hx.    Pulmonary shortness of breath, pneumonia, COPD, former smoker   breath sounds clear to auscultation       Cardiovascular hypertension, (-) angina + CAD, + Past MI and +CHF  (-) CABG + dysrhythmias + Cardiac Defibrillator  Rhythm:Regular Rate:Normal  EF30-35   Neuro/Psych Seizures -, Well Controlled,  PSYCHIATRIC DISORDERS Anxiety Depression     Neuromuscular disease    GI/Hepatic hiatal hernia,,,(+) neg Cirrhosis        Endo/Other  diabetes, Type 2    Renal/GU Renal disease     Musculoskeletal  (+) Arthritis ,    Abdominal   Peds  Hematology  (+) Blood dyscrasia, anemia   Anesthesia Other Findings   Reproductive/Obstetrics                              Anesthesia Physical Anesthesia Plan  ASA: 3  Anesthesia Plan: MAC   Post-op Pain Management:    Induction:   PONV Risk Score and Plan: 1 and Ondansetron  Airway Management Planned:   Additional Equipment:   Intra-op Plan:   Post-operative Plan:   Informed Consent: I have reviewed the patients History and Physical, chart, labs and discussed the procedure including the risks, benefits and alternatives for the proposed anesthesia with the patient or authorized representative who has indicated his/her understanding and acceptance.     Dental advisory given  Plan Discussed with: CRNA  Anesthesia Plan Comments:        Anesthesia Quick Evaluation

## 2023-07-28 NOTE — Op Note (Signed)
[facility] Patient Name: Jonathan Dickson Procedure Date: 07/28/2023 MRN: [id number] Attending MD: Willaim Rayas. Adela Lank , MD, [id number] Date of Birth: May 14, 1956 CSN: [id number] Age: 67 Admit Type: Outpatient Procedure:                Colonoscopy Indications:              High risk colon cancer surveillance: Personal                            history of colonic polyps - adenoma removed 04/2016,                            history of iron deficiency anemia Providers:                Willaim Rayas. Adela Lank, MD, Suzy Bouchard, RNElizabeth                            Cliffton Asters, RN, , Geoffery Lyons, Technician Referring MD:              Medicines:                Monitored Anesthesia Care Complications:            No immediate complications. Estimated blood loss:                            Minimal. Estimated Blood Loss:     Estimated blood loss was minimal. Procedure:                Pre-Anesthesia Assessment:                           - Prior to the procedure, a History and Physical                            was performed, and patient medications and                            allergies were reviewed. The patient's tolerance of                            previous anesthesia was also reviewed. The risks                            and benefits of the procedure and the sedation                            options and risks were discussed with the patient.                            All questions were answered, and informed consent                            was obtained. Prior Anticoagulants: The patient has  taken Lovenox (enoxaparin), last dose was 1 day                            prior to procedure. ASA Grade Assessment: III - A                            patient with severe systemic disease. After                            reviewing the risks and benefits, the patient was                            deemed in satisfactory condition to undergo the                             procedure.                           After obtaining informed consent, the colonoscope                            was passed under direct vision. Throughout the                            procedure, the patient's blood pressure, pulse, and                            oxygen saturations were monitored continuously. The                            [id number]-HQ190L ([id number]) Olympus colonoscope was                            introduced through the anus and advanced to the the                            terminal ileum, with identification of the                            appendiceal orifice and IC valve. The colonoscopy                            was performed without difficulty. The patient                            tolerated the procedure well. The quality of the                            bowel preparation was good. The terminal ileum,                            ileocecal valve, appendiceal orifice, and rectum  were photographed. Scope In: 10:52:02 AM Scope Out: 11:09:08 AM Scope Withdrawal Time: 0 hours 12 minutes 50 seconds  Total Procedure Duration: 0 hours 17 minutes 6 seconds  Findings:      The perianal and digital rectal examinations were normal.      The terminal ileum appeared normal.      Many small-mouthed diverticula were found in the entire colon.      A diminutive polyp was found in the cecum. The polyp was sessile. The       polyp was removed with a cold biopsy forceps. Resection and retrieval       were complete.      Internal hemorrhoids were found during retroflexion.      The exam was otherwise without abnormality. Impression:               - The examined portion of the ileum was normal.                           - Diverticulosis in the entire examined colon.                           - One diminutive polyp in the cecum, removed with a                            cold biopsy forceps. Resected and retrieved.                           -  Internal hemorrhoids.                           - The examination was otherwise normal.                           No cause for iron deficiency on colonoscopy. Moderate Sedation:      No moderate sedation, case performed with MAC Recommendation:           - Patient has a contact number available for                            emergencies. The signs and symptoms of potential                            delayed complications were discussed with the                            patient. Return to normal activities tomorrow.                            Written discharge instructions were provided to the                            patient.                           - Resume previous diet.                           -  Continue present medications.                           - Resume Coumadin tonight, lovenox tomorrow - per                            EGD note                           - Await pathology results. Procedure Code(s):        --- Professional ---                           [phone], Colonoscopy, flexible; with biopsy, single                            or multiple Diagnosis Code(s):        --- Professional ---                           Z86.010, Personal history of colonic polyps                           K64.8, Other hemorrhoids                           D12.0, Benign neoplasm of cecum                           K57.30, Diverticulosis of large intestine without                            perforation or abscess without bleeding CPT copyright 2022 American Medical Association. All rights reserved. The codes documented in this report are preliminary and upon coder review may  be revised to meet current compliance requirements. Viviann Spare P. Sherissa Tenenbaum, MD 07/28/2023 11:14:40 AM This report has been signed electronically. Number of Addenda: 0

## 2023-07-28 NOTE — Op Note (Signed)
[facility] Patient Name: Jonathan Dickson Procedure Date: 07/28/2023 MRN: [id number] Attending MD: Willaim Rayas. Adela Lank , MD, [id number] Date of Birth: 29-Aug-1956 CSN: [id number] Age: 67 Admit Type: Outpatient Procedure:                Upper GI endoscopy Indications:              Iron deficiency anemia, on Coumadin at baseline Providers:                Willaim Rayas. Adela Lank, MD, Suzy Bouchard, RN,                            Doristine Mango, RN, Rozetta Nunnery, Technician,                            Geoffery Lyons, Technician Referring MD:              Medicines:                Monitored Anesthesia Care Complications:            No immediate complications. Estimated blood loss:                            Minimal. Estimated Blood Loss:     Estimated blood loss was minimal. Procedure:                Pre-Anesthesia Assessment:                           - Prior to the procedure, a History and Physical                            was performed, and patient medications and                            allergies were reviewed. The patient's tolerance of                            previous anesthesia was also reviewed. The risks                            and benefits of the procedure and the sedation                            options and risks were discussed with the patient.                            All questions were answered, and informed consent                            was obtained. Prior Anticoagulants: The patient has                            taken Lovenox (enoxaparin), last dose was 1 day  prior to procedure. ASA Grade Assessment: III - A                            patient with severe systemic disease. After                            reviewing the risks and benefits, the patient was                            deemed in satisfactory condition to undergo the                            procedure.                           After obtaining  informed consent, the endoscope was                            passed under direct vision. Throughout the                            procedure, the patient's blood pressure, pulse, and                            oxygen saturations were monitored continuously. The                            [id number]-[id number] ([id number]) Olympus endoscope was introduced                            through the mouth, and advanced to the second part                            of duodenum. The upper GI endoscopy was                            accomplished without difficulty. The patient                            tolerated the procedure well. Scope In: Scope Out: Findings:      Esophagogastric landmarks were identified: the Z-line was found at 31       cm, the gastroesophageal junction was found at 31 cm and the upper       extent of the gastric folds was found at 37 cm from the incisors.      A 6 cm hiatal hernia was present.      The exam of the esophagus was otherwise normal. No erosive changes or       Barrett's.      Diffuse mildly erythematous mucosa was found in the gastric fundus and       in the gastric body.      Localized prominent vascular changes with friable mucosa were found in       the gastric antrum. Fulguration to ablate the area to prevent bleeding       by argon plasma was  successful.      The exam of the stomach was otherwise normal.      Biopsies were taken with a cold forceps for Helicobacter pylori testing.      The examined duodenum was normal. Impression:               - Esophagogastric landmarks identified.                           - 6 cm hiatal hernia.                           - Normal esophagus otherwise.                           - Erythematous mucosa in the gastric fundus and                            gastric body.                           - Friable gastric mucosa with prominent vascular                            markings in the antrum. Treated with argon plasma                             coagulation (APC).                           - Normal stomach otherwise. Biopsies taken to rule                            out H pylori in light of gastric erythema and iron                            deficiency.                           - Normal examined duodenum. Moderate Sedation:      No moderate sedation, case performed with MAC Recommendation:           - Patient has a contact number available for                            emergencies. The signs and symptoms of potential                            delayed complications were discussed with the                            patient. Return to normal activities tomorrow.                            Written discharge instructions were provided to the  patient.                           - Resume previous diet.                           - Continue present medications.                           - Resume coumadin tonight, lovenox tomorrow AM                           - Start omeprazole once daily for next 2 weeks in                            light of APC treatment and anticoagulation, reduce                            risk for post treatment ulcer bleeding. Can also                            use PRN for reflux if needed (large hiatal hernia)                           - Await pathology results.                           - Trend Hgb following IV iron infusion Procedure Code(s):        --- Professional ---                           43255, 59, Esophagogastroduodenoscopy, flexible,                            transoral; with control of bleeding, any method                           43239, Esophagogastroduodenoscopy, flexible,                            transoral; with biopsy, single or multiple Diagnosis Code(s):        --- Professional ---                           K44.9, Diaphragmatic hernia without obstruction or                            gangrene                           K31.89, Other diseases of stomach  and duodenum                           D50.9, Iron deficiency anemia, unspecified CPT copyright 2022 American [employer] Association. All rights reserved. The codes documented in this report are preliminary and upon coder review may  be revised to meet  current compliance requirements. Viviann Spare P. Coral Timme, MD 07/28/2023 11:23:00 AM This report has been signed electronically. Number of Addenda: 0

## 2023-07-29 ENCOUNTER — Encounter (HOSPITAL_COMMUNITY): Payer: Self-pay | Admitting: Gastroenterology

## 2023-07-29 LAB — SURGICAL PATHOLOGY

## 2023-07-31 ENCOUNTER — Other Ambulatory Visit: Payer: Self-pay | Admitting: *Deleted

## 2023-07-31 ENCOUNTER — Encounter (HOSPITAL_COMMUNITY): Payer: Self-pay | Admitting: Emergency Medicine

## 2023-07-31 ENCOUNTER — Other Ambulatory Visit: Payer: Self-pay

## 2023-07-31 ENCOUNTER — Emergency Department (HOSPITAL_COMMUNITY): Payer: 59

## 2023-07-31 ENCOUNTER — Emergency Department (HOSPITAL_COMMUNITY)
Admission: EM | Admit: 2023-07-31 | Discharge: 2023-07-31 | Disposition: A | Discharge status: Home / Self Care | Payer: 59 | Attending: Emergency Medicine | Admitting: Emergency Medicine

## 2023-07-31 DIAGNOSIS — J188 Other pneumonia, unspecified organism: Secondary | ICD-10-CM | POA: Insufficient documentation

## 2023-07-31 DIAGNOSIS — R000 Tachycardia, unspecified: Secondary | ICD-10-CM | POA: Insufficient documentation

## 2023-07-31 DIAGNOSIS — E119 Type 2 diabetes mellitus without complications: Secondary | ICD-10-CM | POA: Insufficient documentation

## 2023-07-31 DIAGNOSIS — Z91010 Allergy to peanuts: Secondary | ICD-10-CM | POA: Insufficient documentation

## 2023-07-31 DIAGNOSIS — Z79899 Other long term (current) drug therapy: Secondary | ICD-10-CM | POA: Diagnosis not present

## 2023-07-31 DIAGNOSIS — Z95810 Presence of automatic (implantable) cardiac defibrillator: Secondary | ICD-10-CM | POA: Insufficient documentation

## 2023-07-31 DIAGNOSIS — Z7984 Long term (current) use of oral hypoglycemic drugs: Secondary | ICD-10-CM | POA: Diagnosis not present

## 2023-07-31 DIAGNOSIS — R109 Unspecified abdominal pain: Secondary | ICD-10-CM | POA: Diagnosis present

## 2023-07-31 DIAGNOSIS — I10 Essential (primary) hypertension: Secondary | ICD-10-CM | POA: Diagnosis not present

## 2023-07-31 DIAGNOSIS — D649 Anemia, unspecified: Secondary | ICD-10-CM

## 2023-07-31 DIAGNOSIS — J189 Pneumonia, unspecified organism: Secondary | ICD-10-CM

## 2023-07-31 LAB — COMPREHENSIVE METABOLIC PANEL
ALT: 23 U/L (ref 0–44)
AST: 23 U/L (ref 15–41)
Albumin: 3.8 g/dL (ref 3.5–5.0)
Alkaline Phosphatase: 63 U/L (ref 38–126)
Anion gap: 10 (ref 5–15)
BUN: 17 mg/dL (ref 8–23)
CO2: 23 mmol/L (ref 22–32)
Calcium: 9.4 mg/dL (ref 8.9–10.3)
Chloride: 103 mmol/L (ref 98–111)
Creatinine, Ser: 1.23 mg/dL (ref 0.61–1.24)
GFR, Estimated: >60 mL/min (ref >60)
Glucose, Bld: 119 mg/dL — ABNORMAL HIGH (ref 70–99)
Potassium: 3.8 mmol/L (ref 3.5–5.1)
Sodium: 136 mmol/L (ref 135–145)
Total Bilirubin: 0.7 mg/dL (ref 0.3–1.2)
Total Protein: 7.8 g/dL (ref 6.5–8.1)

## 2023-07-31 LAB — URINALYSIS, W/ REFLEX TO CULTURE (INFECTION SUSPECTED)
Bacteria, UA: NONE SEEN
Bilirubin Urine: NEGATIVE
Glucose, UA: >=500 mg/dL — AB
Hgb urine dipstick: NEGATIVE
Ketones, ur: NEGATIVE mg/dL
Leukocytes,Ua: NEGATIVE
Nitrite: NEGATIVE
Protein, ur: NEGATIVE mg/dL
Specific Gravity, Urine: 1.027 (ref 1.005–1.030)
pH: 5 (ref 5.0–8.0)

## 2023-07-31 LAB — CBC WITH DIFFERENTIAL/PLATELET
Abs Immature Granulocytes: 0.03 10*3/uL (ref 0.00–0.07)
Basophils Absolute: 0 10*3/uL (ref 0.0–0.1)
Basophils Relative: 1 %
Eosinophils Absolute: 0.2 10*3/uL (ref 0.0–0.5)
Eosinophils Relative: 2 %
HCT: 41.2 % (ref 39.0–52.0)
Hemoglobin: 12.5 g/dL — ABNORMAL LOW (ref 13.0–17.0)
Immature Granulocytes: 0 %
Lymphocytes Relative: 19 %
Lymphs Abs: 1.5 10*3/uL (ref 0.7–4.0)
MCH: 24.4 pg — ABNORMAL LOW (ref 26.0–34.0)
MCHC: 30.3 g/dL (ref 30.0–36.0)
MCV: 80.3 fL (ref 80.0–100.0)
Monocytes Absolute: 0.8 10*3/uL (ref 0.1–1.0)
Monocytes Relative: 10 %
Neutro Abs: 5.6 10*3/uL (ref 1.7–7.7)
Neutrophils Relative %: 68 %
Platelets: 177 10*3/uL (ref 150–400)
RBC: 5.13 MIL/uL (ref 4.22–5.81)
RDW: 22.9 % — ABNORMAL HIGH (ref 11.5–15.5)
WBC: 8.2 10*3/uL (ref 4.0–10.5)
nRBC: 0 % (ref 0.0–0.2)

## 2023-07-31 LAB — LIPASE, BLOOD: Lipase: 24 U/L (ref 11–51)

## 2023-07-31 MED ORDER — CEFDINIR 300 MG PO CAPS
300.0000 mg | ORAL_CAPSULE | Freq: Two times a day (BID) | ORAL | Status: DC
  Filled 2023-07-31: qty 1

## 2023-07-31 MED ORDER — ONDANSETRON HCL 4 MG/2ML IJ SOLN
4.0000 mg | Freq: Once | INTRAMUSCULAR | Status: AC
  Administered 2023-07-31: 4 mg via INTRAVENOUS
  Filled 2023-07-31: qty 2

## 2023-07-31 MED ORDER — DICYCLOMINE HCL 10 MG PO CAPS
20.0000 mg | ORAL_CAPSULE | Freq: Once | ORAL | Status: AC
  Administered 2023-07-31: 20 mg via ORAL
  Filled 2023-07-31: qty 2

## 2023-07-31 MED ORDER — CARVEDILOL 12.5 MG PO TABS
12.5000 mg | ORAL_TABLET | Freq: Once | ORAL | Status: AC
  Administered 2023-07-31: 12.5 mg via ORAL
  Filled 2023-07-31: qty 1

## 2023-07-31 MED ORDER — DICYCLOMINE HCL 20 MG PO TABS: 20.0000 mg | ORAL_TABLET | Freq: Two times a day (BID) | ORAL | 0 refills | Status: DC

## 2023-07-31 MED ORDER — MORPHINE SULFATE (PF) 4 MG/ML IV SOLN
4.0000 mg | Freq: Once | INTRAVENOUS | Status: AC
  Administered 2023-07-31: 4 mg via INTRAVENOUS
  Filled 2023-07-31: qty 1

## 2023-07-31 MED ORDER — LEVOFLOXACIN 750 MG PO TABS: 750.0000 mg | ORAL_TABLET | Freq: Every day | ORAL | 0 refills | Status: AC

## 2023-07-31 MED ORDER — LEVOFLOXACIN 750 MG PO TABS
750.0000 mg | ORAL_TABLET | Freq: Once | ORAL | Status: AC
  Administered 2023-07-31: 750 mg via ORAL
  Filled 2023-07-31: qty 1

## 2023-07-31 MED ORDER — ONDANSETRON HCL 4 MG PO TABS: 4.0000 mg | ORAL_TABLET | Freq: Four times a day (QID) | ORAL | 0 refills | Status: DC

## 2023-07-31 NOTE — Discharge Instructions (Addendum)
Your imaging showed a pneumonia, this can sometimes contribute to some abdominal discomfort and nausea.  Take antibiotic starting tomorrow for 4 more days, you were given your first dose of antibiotics at the hospital today.  Your right-sided abdominal pain may be due to cramping and discomfort as your colon becomes distended with stool after your recent colonoscopy prep, your abdominal CT scan was reassuring and did not show any complications from your procedure. You can use miralax to help get your bowels moving, drink plenty of water and eat fiber. Can use prescribed Bentyl as needed for cramping abdominal pain and Zofran as needed for nausea and vomiting.  Follow-up closely with your primary care doctor.  Return for worsening pain, fever, difficulty breathing or any other new or concerning symptoms.

## 2023-07-31 NOTE — ED Provider Notes (Signed)
[place] EMERGENCY DEPARTMENT AT [facility] Provider Note   CSN: [id number] Arrival date & time: 07/31/23  0258     History  Chief Complaint  Patient presents with   Abdominal Pain    Jonathan Dickson is a 67 y.o. male.  Jonathan Dickson is a 67 y.o. male with a history of hypertension, MI, diabetes, AICD in place, who presents to the emergency department for evaluation of right-sided abdominal pain present for 2 days.  Pain has been constant and worsened this morning.  Patient reports that he had a colonoscopy and endoscopy performed on 10/28 and reports that he thinks he had a polyp removed during the procedure.  He had 1 episode of vomiting overnight that was nonbloody.  Reports he has been passing gas but has not yet had a bowel movement since his colonoscopy.  He denies any chest pain, shortness of breath or dizziness.  Reports he feels some soreness in the muscles on the right side of his back as well as the right sided abdominal pain.  No noted fever fevers.  Patient reports history of appendectomy.  The history is provided by the patient.  Abdominal Pain Associated symptoms: nausea, shortness of breath and vomiting   Associated symptoms: no chest pain, no chills, no cough, no diarrhea, no dysuria, no fever and no hematuria        Home Medications Prior to Admission medications   Medication Sig Start Date End Date Taking? Authorizing Provider  dicyclomine (BENTYL) 20 MG tablet Take 1 tablet (20 mg total) by mouth 2 (two) times daily. 07/31/23  Yes Dartha Lodge, PA-C  levofloxacin (LEVAQUIN) 750 MG tablet Take 1 tablet (750 mg total) by mouth daily for 4 days. 08/01/23 08/05/23 Yes Dartha Lodge, PA-C  ondansetron (ZOFRAN) 4 MG tablet Take 1 tablet (4 mg total) by mouth every 6 (six) hours. 07/31/23  Yes Dartha Lodge, PA-C  acetaminophen (TYLENOL) 500 MG tablet Take 1,000 mg by mouth every 6 (six) hours as needed for moderate pain or headache.    [provider]  albuterol (VENTOLIN HFA) 108 (90 Base) MCG/ACT inhaler INHALE 2 PUFFS INTO THE LUNGS EVERY 6 HOURS AS NEEDED FOR WHEEZING OR SHORTNESS OF BREATH 05/02/23   Parrett, Virgel Bouquet, NP  Aromatic Inhalants (VICKS VAPOINHALER) INHA Inhale 1 Dose into the lungs daily as needed (congestion).    [provider]  atorvastatin (LIPITOR) 80 MG tablet Take 1 tablet (80 mg total) by mouth daily. 02/05/23   Claiborne Rigg, NP  carvedilol (COREG) 12.5 MG tablet Take 1 tablet (12.5 mg total) by mouth 2 (two) times daily with a meal. 11/18/22   Milford, Anderson Malta, FNP  Chlorphen-Phenyleph-ASA (ALKA-SELTZER PLUS COLD PO) Take 1-2 tablets by mouth 2 (two) times daily as needed (cold symptoms).     [provider]  cholecalciferol (VITAMIN D3) 25 MCG (1000 UNIT) tablet Take 1,000 Units by mouth daily. 11/25/17   [provider]  dapagliflozin propanediol (FARXIGA) 10 MG TABS tablet Take 1 tablet (10 mg total) by mouth daily. 12/03/22   Bensimhon, Bevelyn Buckles, MD  diclofenac Sodium (VOLTAREN) 1 % GEL Apply 2 g topically 4 (four) times daily as needed (pain).    [provider]  enoxaparin (LOVENOX) 80 MG/0.8ML injection Inject 0.8 mLs (80 mg total) into the skin every 12 (twelve) hours. 05/14/23   Runell Gess, MD  enoxaparin (LOVENOX) 80 MG/0.8ML injection Inject 0.8 mLs (80 mg total) into the  skin every 12 (twelve) hours. 07/21/23   Runell Gess, MD  fluticasone (FLONASE) 50 MCG/ACT nasal spray SHAKE LIQUID AND USE 1 SPRAY IN EACH NOSTRIL DAILY 04/17/23   Cobb, Ruby Cola, NP  gabapentin (NEURONTIN) 300 MG capsule Take 300 mg by mouth daily as needed. 07/07/23   [provider]  lidocaine (LIDODERM) 5 % Place 1 patch onto the skin daily. Remove & Discard patch within 12 hours or as directed by MD Patient not taking: Reported on 07/14/2023 05/05/23   Sabas Sous, MD  loratadine (CLARITIN) 10 MG tablet Take 1 tablet (10 mg total) by mouth daily. 02/05/23   Claiborne Rigg, NP  metFORMIN (GLUCOPHAGE) 500 MG tablet Take 1 tablet (500 mg total) by mouth daily with breakfast. 02/05/23   Claiborne Rigg, NP  methocarbamol (ROBAXIN) 500 MG tablet Take 1 tablet (500 mg total) by mouth every 8 (eight) hours as needed for muscle spasms. 05/05/23   Sabas Sous, MD  Misc. Devices MISC by Does not apply route. Flutter valve daily    [provider]  omeprazole (PRILOSEC OTC) 20 MG tablet Take 1 tablet (20 mg total) by mouth daily. Take 1 tablet daily for the next 2 weeks and then as needed for reflux 07/28/23 07/27/24  Armbruster, Willaim Rayas, MD  sacubitril-valsartan (ENTRESTO) 49-51 MG Take 1 tablet by mouth 2 (two) times daily. 11/18/22   Jacklynn Ganong, FNP  spironolactone (ALDACTONE) 25 MG tablet Take 0.5 tablets (12.5 mg total) by mouth daily. TAKE 1/2 TABLET(12.5 MG) BY MOUTH DAILY 11/18/22   Milford, Anderson Malta, FNP  Tafamidis [facility]) 61 MG CAPS Take 1 capsule by mouth daily. 11/19/22   Bensimhon, Bevelyn Buckles, MD  tiZANidine (ZANAFLEX) 4 MG capsule Take 4 mg by mouth daily as needed. 07/10/23   [provider]  warfarin (COUMADIN) 5 MG tablet TAKE 1 TO 2 TABLETS BY MOUTH DAILY AS DIRECTED BY COUMADIN CLINIC 04/11/23   Runell Gess, MD      Allergies    Peanut-containing drug products, Penicillins, Decadron [dexamethasone], and Ivp dye [iodinated contrast media]    Review of Systems   Review of Systems  Constitutional:  Negative for chills and fever.  HENT: Negative.    Respiratory:  Positive for shortness of breath. Negative for cough.   Cardiovascular:  Negative for chest pain.  Gastrointestinal:  Positive for abdominal pain, nausea and vomiting. Negative for blood in stool and diarrhea.  Genitourinary:  Negative for difficulty urinating, dysuria, frequency and hematuria.  Musculoskeletal:  Positive for myalgias.  All other systems reviewed and are negative.   Physical Exam Updated Vital Signs BP (!) 147/98 (BP Location: Right  Arm)   Pulse (!) 105   Temp (!) 97.4 F (36.3 C) (Oral)   Resp 18   SpO2 95%  Physical Exam Vitals and nursing note reviewed.  Constitutional:      General: He is not in acute distress.    Appearance: Normal appearance. He is well-developed. He is not diaphoretic.  HENT:     Head: Normocephalic and atraumatic.  Eyes:     General:        Right eye: No discharge.        Left eye: No discharge.     Pupils: Pupils are equal, round, and reactive to light.  Cardiovascular:     Rate and Rhythm: Regular rhythm. Tachycardia present.     Pulses: Normal pulses.     Heart sounds: Normal heart sounds.  Comments: Mild tachycardia with regular rhythm Pulmonary:     Effort: Pulmonary effort is normal. No respiratory distress.     Breath sounds: Normal breath sounds. No wheezing or rales.     Comments: Respirations equal and unlabored, patient able to speak in full sentences, lungs clear to auscultation bilaterally, slightly decreased breath sounds at bilateral bases Abdominal:     General: Bowel sounds are normal. There is no distension.     Palpations: Abdomen is soft. There is no mass.     Tenderness: There is abdominal tenderness. There is no guarding.     Comments: Abdomen soft, mildly distended, bowel sounds present throughout, tenderness along the right side of the abdomen without guarding or rebound tenderness  Musculoskeletal:        General: No deformity.     Cervical back: Neck supple.  Skin:    General: Skin is warm and dry.     Capillary Refill: Capillary refill takes less than 2 seconds.  Neurological:     Mental Status: He is alert and oriented to person, place, and time.     Coordination: Coordination normal.     Comments: Speech is clear, able to follow commands Moves extremities without ataxia, coordination intact  Psychiatric:        Mood and Affect: Mood normal.        Behavior: Behavior normal.     ED Results / Procedures / Treatments   Labs (all labs ordered  are listed, but only abnormal results are displayed) Labs Reviewed  COMPREHENSIVE METABOLIC PANEL - Abnormal; Notable for the following components:      Result Value   Glucose, Bld 119 (*)    All other components within normal limits  CBC WITH DIFFERENTIAL/PLATELET - Abnormal; Notable for the following components:   Hemoglobin 12.5 (*)    MCH 24.4 (*)    RDW 22.9 (*)    All other components within normal limits  URINALYSIS, W/ REFLEX TO CULTURE (INFECTION SUSPECTED) - Abnormal; Notable for the following components:   Glucose, UA >=500 (*)    All other components within normal limits  LIPASE, BLOOD    EKG None  Radiology CT ABDOMEN PELVIS WO CONTRAST  Result Date: 07/31/2023 CLINICAL DATA:  RLQ abdominal pain EXAM: CT ABDOMEN AND PELVIS WITHOUT CONTRAST TECHNIQUE: Multidetector CT imaging of the abdomen and pelvis was performed following the standard protocol without IV contrast. RADIATION DOSE REDUCTION: This exam was performed according to the departmental dose-optimization program which includes automated exposure control, adjustment of the mA and/or kV according to patient size and/or use of iterative reconstruction technique. COMPARISON:  CT scan renal stone protocol from 08/17/2021. FINDINGS: Lower chest: There are heterogeneous opacities in the visualized portion of inferior lingula and left lung lower lobe with air bronchogram, favoring multilobar pneumonia. Correlate clinically. Visualized bilateral lungs are otherwise clear. No pleural effusion on either side. The heart is mildly enlarged in size. No pericardial effusion. Hepatobiliary: The liver is normal in size. Non-cirrhotic configuration. No suspicious mass. No intrahepatic or extrahepatic bile duct dilation. No calcified gallstones. Normal gallbladder wall thickness. No pericholecystic inflammatory changes. Pancreas: Unremarkable. No pancreatic ductal dilatation or surrounding inflammatory changes. Spleen: Within normal limits.  No focal lesion. Adrenals/Urinary Tract: Adrenal glands are unremarkable. No suspicious renal mass seen within the limitations of this unenhanced exam. No hydronephrosis. No renal or ureteric calculi. Unremarkable urinary bladder. Stomach/Bowel: There is a small-to-moderate sliding hiatal hernia. No disproportionate dilation of the small or large bowel loops. No  evidence of abnormal bowel wall thickening or inflammatory changes. The appendix was not visualized; however there is no acute inflammatory process in the right lower quadrant. There are multiple diverticula mainly in the sigmoid colon, without imaging signs of diverticulitis. Vascular/Lymphatic: No ascites or pneumoperitoneum. No abdominal or pelvic lymphadenopathy, by size criteria. No aneurysmal dilation of the major abdominal arteries. There are moderate peripheral atherosclerotic vascular calcifications of the aorta and its major branches. Reproductive: Enlarged prostate. Symmetric seminal vesicles. Other: There are fat containing umbilical and bilateral inguinal hernias. The soft tissues and abdominal wall are otherwise unremarkable. Musculoskeletal: No suspicious osseous lesions. There are mild multilevel degenerative changes in the visualized spine. IMPRESSION: 1. Left lung inferior lingula and lower lobe multilobar pneumonia, as described above. Correlate clinically. Follow-up to clearing is recommended. 2. No acute inflammatory process identified within the abdomen or pelvis. 3. Multiple other nonacute observations, as described above. Electronically Signed   By: Jules Schick M.D.   On: 07/31/2023 08:36   DG Chest Portable 1 View  Result Date: 07/31/2023 CLINICAL DATA:  right sided pain EXAM: PORTABLE CHEST 1 VIEW COMPARISON:  04/01/2021. FINDINGS: There are heterogeneous opacities at the left lung base obscuring the left hemidiaphragm and portion of left heart border, favoring left basilar consolidation and/or atelectasis. Bilateral lung  fields are otherwise clear. There is apparent blunting of left lateral costophrenic angle, which may be due to trace pleural effusion versus overlying lung parenchymal opacities. Right lateral costophrenic angle is clear. Stable cardio-mediastinal silhouette. There is a left-sided ICD device. No acute osseous abnormalities. Cervical spinal fixation hardware seen. The soft tissues are within normal limits. IMPRESSION: *Left basilar consolidation and/or atelectasis, as described above. Follow-up to clearing is recommended Electronically Signed   By: Jules Schick M.D.   On: 07/31/2023 08:28    Procedures Procedures    Medications Ordered in ED Medications  ondansetron (ZOFRAN) injection 4 mg (4 mg Intravenous Given 07/31/23 0745)  morphine (PF) 4 MG/ML injection 4 mg (4 mg Intravenous Given 07/31/23 0748)  carvedilol (COREG) tablet 12.5 mg (12.5 mg Oral Given 07/31/23 1143)  dicyclomine (BENTYL) capsule 20 mg (20 mg Oral Given 07/31/23 1144)  levofloxacin (LEVAQUIN) tablet 750 mg (750 mg Oral Given 07/31/23 1143)    ED Course/ Medical Decision Making/ A&P                                 Medical Decision Making Amount and/or Complexity of Data Reviewed Labs: ordered. Radiology: ordered.  Risk Prescription drug management.   67 y.o. male presents to the ED with complaints of right sided abdominal pain after recent colonoscopy, this involves an extensive number of treatment options, and is a complaint that carries with it a high risk of complications and morbidity.  The differential diagnosis includes   On arrival pt is nontoxic, vitals show mild tachycardia, pt did not take his morning dose of carvedilol, afebrile and vitals otherwise normal.  Additional history obtained from chart review. Previous records obtained and reviewed including records from recent colonoscopy and endoscopy  I ordered medication including morphine and Zofran for symptomatic treatment  Lab Tests:  I Ordered,  reviewed, and interpreted labs, which included: No leukocytosis, stable hemoglobin, no significant electrolyte derangements, normal renal and liver function normal lipase, UA without hematuria or signs of infection.  Imaging Studies ordered:  I ordered imaging studies which included portable chest x-ray and CT abdomen pelvis without contrast due  to contrast allergy, I independently visualized and interpreted imaging which showed left basilar consolidation noted on chest x-ray, this was also seen in the lung bases on CT concerning for multifocal pneumonia, aside from this finding on CT abdominal CT otherwise reassuring no evidence of perforation or bowel obstruction.  On review of images it does appear that there is a moderate amount of stool throughout the ascending colon along the right side of the abdomen and I wonder if this could be causing cramping and be contributing to patient's abdominal pain  ED Course:   Discussed reassuring labs with patient.  Pneumonia noted on CT scan and x-ray.  Given patient's penicillin allergy and multiple comorbidities we will treat with Levaquin, 5-day course, first dose given in the ED and patient able to tolerate without difficulty.  Patient also given dose of his carvedilol.  In the ED and heart rate is starting to return to normal  Stressed the importance of close outpatient follow-up.  Prescribed Zofran and Bentyl as needed for nausea and vomiting.  Bentyl to help with abdominal cramping, encourage patient to drink water and increase fiber intake to help get bowels moving again, can use MiraLAX as needed.  Follow-up with GI and primary care.  Discussed tricked return precautions.  Discharged home in good condition.   Portions of this note were generated with Scientist, clinical (histocompatibility and immunogenetics). Dictation errors may occur despite best attempts at proofreading.         Final Clinical Impression(s) / ED Diagnoses Final diagnoses:  Multifocal pneumonia  Right sided  abdominal pain    Rx / DC Orders ED Discharge Orders          Ordered    levofloxacin (LEVAQUIN) 750 MG tablet  Daily        07/31/23 1216    dicyclomine (BENTYL) 20 MG tablet  2 times daily        07/31/23 1216    ondansetron (ZOFRAN) 4 MG tablet  Every 6 hours        07/31/23 1216              Dartha Lodge, [place] 08/01/23 2253    Gwyneth Sprout, MD 08/05/23 [phone]

## 2023-07-31 NOTE — ED Triage Notes (Addendum)
  Patient comes in with R sided pain that has been going on for 2 days.  Patient had colonoscopy performed on 10/28 and had 3 polyps removed.  Patient denies any dizziness, SOB, or blood in stool.  Patient states when he ambulates his muscles on his lower back and R side tense up.  Pain 10/10, sore/tender.  Concerned for possible sciatica.

## 2023-08-01 ENCOUNTER — Ambulatory Visit (INDEPENDENT_AMBULATORY_CARE_PROVIDER_SITE_OTHER): Payer: 59 | Admitting: Podiatry

## 2023-08-01 ENCOUNTER — Other Ambulatory Visit: Payer: Self-pay | Admitting: Nurse Practitioner

## 2023-08-01 ENCOUNTER — Encounter: Payer: Self-pay | Admitting: *Deleted

## 2023-08-01 ENCOUNTER — Telehealth: Payer: Self-pay | Admitting: Gastroenterology

## 2023-08-01 ENCOUNTER — Encounter: Payer: Self-pay | Admitting: Podiatry

## 2023-08-01 ENCOUNTER — Other Ambulatory Visit: Payer: Self-pay | Admitting: *Deleted

## 2023-08-01 ENCOUNTER — Other Ambulatory Visit: Payer: Self-pay | Admitting: Orthopedic Surgery

## 2023-08-01 DIAGNOSIS — M79674 Pain in right toe(s): Secondary | ICD-10-CM | POA: Diagnosis not present

## 2023-08-01 DIAGNOSIS — M79675 Pain in left toe(s): Secondary | ICD-10-CM | POA: Diagnosis not present

## 2023-08-01 DIAGNOSIS — B351 Tinea unguium: Secondary | ICD-10-CM | POA: Diagnosis not present

## 2023-08-01 DIAGNOSIS — E119 Type 2 diabetes mellitus without complications: Secondary | ICD-10-CM | POA: Diagnosis not present

## 2023-08-01 DIAGNOSIS — D649 Anemia, unspecified: Secondary | ICD-10-CM

## 2023-08-01 MED ORDER — DOXYCYCLINE HYCLATE 100 MG PO TABS
100.0000 mg | ORAL_TABLET | Freq: Two times a day (BID) | ORAL | 0 refills | Status: AC
Start: 2023-08-01 — End: 2023-08-15

## 2023-08-01 MED ORDER — METRONIDAZOLE 250 MG PO TABS: 250.0000 mg | ORAL_TABLET | Freq: Four times a day (QID) | ORAL | 0 refills | Status: AC

## 2023-08-01 MED ORDER — BISMUTH SUBSALICYLATE 262 MG PO CHEW
524.0000 mg | CHEWABLE_TABLET | Freq: Four times a day (QID) | ORAL | 0 refills | Status: AC
Start: 2023-08-01 — End: 2023-08-15

## 2023-08-01 NOTE — Telephone Encounter (Signed)
Requested Prescriptions  Pending Prescriptions Disp Refills   metFORMIN (GLUCOPHAGE) 500 MG tablet [Pharmacy Med Name: METFORMIN 500MG  TABLETS] 90 tablet 0    Sig: TAKE 1 TABLET(500 MG) BY MOUTH DAILY WITH BREAKFAST     Endocrinology:  Diabetes - Biguanides Failed - 08/01/2023  3:33 AM      Failed - CBC within normal limits and completed in the last 12 months    WBC  Date Value Ref Range Status  07/31/2023 8.2 4.0 - 10.5 K/uL Final   RBC  Date Value Ref Range Status  07/31/2023 5.13 4.22 - 5.81 MIL/uL Final   Hemoglobin  Date Value Ref Range Status  07/31/2023 12.5 (L) 13.0 - 17.0 g/dL Final  13/04/6577 46.9 (L) 13.0 - 17.0 g/dL Final  62/95/2841 32.4 (L) 13.0 - 17.7 g/dL Final   HCT  Date Value Ref Range Status  07/31/2023 41.2 39.0 - 52.0 % Final   Hematocrit  Date Value Ref Range Status  02/05/2023 42.3 37.5 - 51.0 % Final   MCHC  Date Value Ref Range Status  07/31/2023 30.3 30.0 - 36.0 g/dL Final   [facility]  Date Value Ref Range Status  07/31/2023 24.4 (L) 26.0 - 34.0 pg Final   MCV  Date Value Ref Range Status  07/31/2023 80.3 80.0 - 100.0 fL Final  02/05/2023 79 79 - 97 fL Final   No results found for: "PLTCOUNTKUC", "LABPLAT", "POCPLA" RDW  Date Value Ref Range Status  07/31/2023 22.9 (H) 11.5 - 15.5 % Final  02/05/2023 17.3 (H) 11.6 - 15.4 % Final         Passed - Cr in normal range and within 360 days    Creatinine  Date Value Ref Range Status  06/04/2023 1.38 (H) 0.61 - 1.24 mg/dL Final   Creat  Date Value Ref Range Status  03/21/2016 0.98 0.70 - 1.25 mg/dL Final    Comment:      For patients > or = 67 years of age: The upper reference limit for Creatinine is approximately 13% higher for people identified as African-American.      Creatinine, Ser  Date Value Ref Range Status  07/31/2023 1.23 0.61 - 1.24 mg/dL Final         Passed - HBA1C is between 0 and 7.9 and within 180 days    HbA1c, POC (controlled diabetic range)  Date Value Ref  Range Status  08/13/2022 6.4 0.0 - 7.0 % Final   Hgb A1c MFr Bld  Date Value Ref Range Status  02/05/2023 6.8 (H) 4.8 - 5.6 % Final    Comment:             Prediabetes: 5.7 - 6.4          Diabetes: >6.4          Glycemic control for adults with diabetes: <7.0          Passed - eGFR in normal range and within 360 days    GFR, Est African American  Date Value Ref Range Status  10/04/2015 >89 >=60 mL/min Final   GFR calc Af Amer  Date Value Ref Range Status  07/21/2020 62 >59 mL/min/1.73 Final    Comment:    **In accordance with recommendations from the NKF-ASN Task force,**   [facility] is in the process of updating its eGFR calculation to the   2021 CKD-EPI creatinine equation that estimates kidney function   without a race variable.    GFR, Est Non African American  Date Value  Ref Range Status  10/04/2015 >89 >=60 mL/min Final    Comment:      The estimated GFR is a calculation valid for adults (>=64 years old) that uses the CKD-EPI algorithm to adjust for age and sex. It is   not to be used for children, pregnant women, hospitalized patients,    patients on dialysis, or with rapidly changing kidney function. According to the NKDEP, eGFR >89 is normal, 60-89 shows mild impairment, 30-59 shows moderate impairment, 15-29 shows severe impairment and <15 is ESRD.      GFR, Estimated  Date Value Ref Range Status  07/31/2023 >60 >60 mL/min Final    Comment:    (NOTE) Calculated using the CKD-EPI Creatinine Equation (2021)   06/04/2023 56 (L) >60 mL/min Final    Comment:    (NOTE) Calculated using the CKD-EPI Creatinine Equation (2021)    eGFR  Date Value Ref Range Status  02/05/2023 65 >59 mL/min/1.73 Final         Passed - B12 Level in normal range and within 720 days    Vitamin B-12  Date Value Ref Range Status  06/04/2023 233 180 - 914 pg/mL Final    Comment:    (NOTE) This assay is not validated for testing neonatal or myeloproliferative syndrome  specimens for Vitamin B12 levels. Performed at [facility], [address], [place], [place] [address]          Passed - Valid encounter within last 6 months    Recent Outpatient Visits           5 months ago Primary hypertension   [place] [facility] [place], Shea Stakes, NP   9 months ago Encounter for annual physical exam   [facility] [place], Shea Stakes, NP   11 months ago Encounter to establish care   [facility] [place], [place] W, NP   1 year ago Type 2 diabetes mellitus without complication, without long-term current use of insulin [facility])   [facility] [place], [place], [place]   2 years ago Type 2 diabetes mellitus without complication, without long-term current use of insulin [facility])   [facility] [place], [place], [place]

## 2023-08-01 NOTE — Progress Notes (Deleted)
Omeprazole 20 mg 2 times a day x 14 d Pepto Bismol 2 tabs (262 mg each) 4 times a day x 14 d Metronidazole 250 mg 4 times a day x 14 d doxycycline 100 mg 2 timepes a day x 14 d

## 2023-08-01 NOTE — Addendum Note (Signed)
Addended by: Richardson Chiquito on: 08/01/2023 05:21 PM   Modules accepted: Orders

## 2023-08-01 NOTE — Telephone Encounter (Signed)
Inbound call from patient stating he is returning a phone call. Requesting a call back. Please advise, thank you.

## 2023-08-01 NOTE — Telephone Encounter (Signed)
See 07/29/23 surg path result notes for additional information.

## 2023-08-01 NOTE — Progress Notes (Signed)
This patient returns to my office for at risk foot care.  This patient requires this care by a professional since this patient will be at risk due to having coagulation defect, diabetes and peripheral claudication.  Patient is taking coumadin.  This patient is unable to cut nails himself since the patient cannot reach his nails.These nails are painful walking and wearing shoes.  This patient presents for at risk foot care today.  General Appearance  Alert, conversant and in no acute stress.  Vascular  Dorsalis pedis and posterior tibial  pulses are weakly palpable  bilaterally.  Capillary return is within normal limits  bilaterally. Cold feet bilaterally. Absent digital hair.  Neurologic  Senn-Weinstein monofilament wire test within normal limits  bilaterally. Muscle power within normal limits bilaterally.  Nails Thick disfigured discolored nails with subungual debris  from hallux to fifth toes bilaterally. No evidence of bacterial infection or drainage bilaterally.  Orthopedic  No limitations of motion  feet .  No crepitus or effusions noted.  No bony pathology or digital deformities noted.  Skin  normotropic skin with no porokeratosis noted bilaterally.  No signs of infections or ulcers noted.     Onychomycosis  Pain in right toes  Pain in left toes  Consent was obtained for treatment procedures.   Mechanical debridement of nails 1-5  bilaterally performed with a nail nipper.  Filed with dremel without incident.    Return office visit   3  months                   Told patient to return for periodic foot care and evaluation due to potential at risk complications.   Gardiner Barefoot DPM

## 2023-08-04 ENCOUNTER — Telehealth: Payer: Self-pay

## 2023-08-04 ENCOUNTER — Ambulatory Visit: Payer: 59 | Attending: Internal Medicine

## 2023-08-04 ENCOUNTER — Telehealth: Payer: Self-pay | Admitting: Pharmacist Clinician (PhC)/ Clinical Pharmacy Specialist

## 2023-08-04 DIAGNOSIS — I824Y9 Acute embolism and thrombosis of unspecified deep veins of unspecified proximal lower extremity: Secondary | ICD-10-CM

## 2023-08-04 DIAGNOSIS — Z7901 Long term (current) use of anticoagulants: Secondary | ICD-10-CM

## 2023-08-04 LAB — POCT INR: INR: 3.4 — AB (ref 2.0–3.0)

## 2023-08-04 MED ORDER — ENOXAPARIN SODIUM 80 MG/0.8ML IJ SOSY: 80.0000 mg | PREFILLED_SYRINGE | Freq: Two times a day (BID) | INTRAMUSCULAR | 1 refills | Status: DC

## 2023-08-04 NOTE — Telephone Encounter (Signed)
Please advise holding Coumadin prior to anterior cervical decompression fusion C7-T1 with instrumentation and allograft on 08/14/2023.  Thank you!  DW

## 2023-08-04 NOTE — Patient Instructions (Signed)
HOLD TOMORROW ONLY THEN Continue taking warfarin 1 tablet (5mg ) daily, except 2 tablets (10mg ) on Monday, Wednesday and Friday. Neck Fusion 11/14 Lovenox Bridged.  Holding Coumadin 11/9-11/13 Recheck INR in 2 WEEKS  (normally 6 weeks).  Coumadin Clinic [phone] Procedure Clearance Fax [phone] or [phone]   11/8: Last dose of warfarin.  11/9: No warfarin or enoxaparin (Lovenox).  11/10: Inject enoxaparin 80mg  in the fatty abdominal tissue at least 2 inches from the belly button twice a day about 12 hours apart, 8am and 8pm rotate sites. No warfarin.  11/11: Inject enoxaparin in the fatty tissue every 12 hours, 8am and 8pm. No warfarin.  11/12: Inject enoxaparin in the fatty tissue every 12 hours, 8am and 8pm. No warfarin.  11/13: Inject enoxaparin in the fatty tissue in the morning at 8 am (No PM dose). No warfarin.  11/14: Procedure Day - No enoxaparin - Resume warfarin in the evening or as directed by doctor (take an extra half tablet with usual dose for 2 days then resume normal dose).  11/15: Resume enoxaparin inject in the fatty tissue every 12 hours and take warfarin  11/16: Inject enoxaparin in the fatty tissue every 12 hours and take warfarin  11/17: Inject enoxaparin in the fatty tissue every 12 hours and take warfarin  11/18: Inject enoxaparin in the fatty tissue every 12 hours and take warfarin  11/19: Inject enoxaparin in the fatty tissue every 12 hours and take warfarin  11/20: warfarin appt to check INR.

## 2023-08-04 NOTE — Telephone Encounter (Signed)
Patient returned call to schedule tele visit.

## 2023-08-04 NOTE — Telephone Encounter (Signed)
Patient scheduled for tele visit on 08/07/23. Med rec and consent done.

## 2023-08-04 NOTE — Telephone Encounter (Signed)
1st attempt to reach pt to schedule tele visit. No answerlv, .. Ok'd to use provider use slot for next week per preop APP

## 2023-08-04 NOTE — Telephone Encounter (Signed)
  Patient Consent for Virtual Visit         [name] has provided verbal consent on 08/04/2023 for a virtual visit (video or telephone).   CONSENT FOR VIRTUAL VISIT FOR:  Jonathan Dickson  By participating in this virtual visit I agree to the following:  I hereby voluntarily request, consent and authorize [facility] and its employed or contracted physicians, physician assistants, nurse practitioners or other licensed health care professionals (the Practitioner), to provide me with telemedicine health care services (the "Services") as deemed necessary by the treating Practitioner. I acknowledge and consent to receive the Services by the Practitioner via telemedicine. I understand that the telemedicine visit will involve communicating with the Practitioner through live audiovisual communication technology and the disclosure of certain medical information by electronic transmission. I acknowledge that I have been given the opportunity to request an in-person assessment or other available alternative prior to the telemedicine visit and am voluntarily participating in the telemedicine visit.  I understand that I have the right to withhold or withdraw my consent to the use of telemedicine in the course of my care at any time, without affecting my right to future care or treatment, and that the Practitioner or I may terminate the telemedicine visit at any time. I understand that I have the right to inspect all information obtained and/or recorded in the course of the telemedicine visit and may receive copies of available information for a reasonable fee.  I understand that some of the potential risks of receiving the Services via telemedicine include:  Delay or interruption in medical evaluation due to technological equipment failure or disruption; Information transmitted may not be sufficient (e.g. poor resolution of images) to allow for appropriate medical decision making by the Practitioner;  and/or  In rare instances, security protocols could fail, causing a breach of personal health information.  Furthermore, I acknowledge that it is my responsibility to provide information about my medical history, conditions and care that is complete and accurate to the best of my ability. I acknowledge that Practitioner's advice, recommendations, and/or decision may be based on factors not within their control, such as incomplete or inaccurate data provided by me or distortions of diagnostic images or specimens that may result from electronic transmissions. I understand that the practice of medicine is not an exact science and that Practitioner makes no warranties or guarantees regarding treatment outcomes. I acknowledge that a copy of this consent can be made available to me via my patient portal [facility] MyChart), or I can request a printed copy by calling the office of [facility].    I understand that my insurance will be billed for this visit.   I have read or had this consent read to me. I understand the contents of this consent, which adequately explains the benefits and risks of the Services being provided via telemedicine.  I have been provided ample opportunity to ask questions regarding this consent and the Services and have had my questions answered to my satisfaction. I give my informed consent for the services to be provided through the use of telemedicine in my medical care

## 2023-08-04 NOTE — Telephone Encounter (Signed)
   Name: Jonathan Dickson  DOB: [birth year]  MRN: [id number]  Primary Cardiologist: Nanetta Batty, MD   Preoperative team, please contact this patient and set up a phone call appointment for further preoperative risk assessment. Please obtain consent and complete medication review. Thank you for your help.  Okay to use provider slot.  I confirm that guidance regarding antiplatelet and oral anticoagulation therapy has been completed and, if necessary, noted below.  Per Pharm.D.: Per office protocol, patient can hold warfarin for 5 days prior to procedure.     Patient WILL need bridging with Lovenox (enoxaparin) around procedure.   Lovenox will be coordinated by coumadin clinic.    I also confirmed the patient resides in the state of [place]. As per [facility] Medical Board telemedicine laws, the patient must reside in the state in which the provider is licensed.   Carlos Levering, NP 08/04/2023, 2:22 PM [facility]

## 2023-08-04 NOTE — Telephone Encounter (Signed)
   Pre-operative Risk Assessment    Patient Name: Jonathan Dickson  DOB: 05-12-1956 MRN: [id number]      Request for Surgical Clearance    Procedure:   ANTERIOR CERVICAL DECOMPRESSION FUSION CERVICAL 7 - THORACIC 1 WITH INSTRUMENTATION AND ALLOGRAFT  Date of Surgery:  Clearance 08/14/23                             {     Surgeon:  Estill Bamberg Surgeon's Group or Practice Name:   Phone number:   Fax number:     Type of Clearance Requested:   - Medical  - Pharmacy   Type of Anesthesia:  Not Indicated   Additional requests/questions:    Kirk Ruths   08/04/2023, 7:39 AM

## 2023-08-04 NOTE — Telephone Encounter (Signed)
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    Procedure:   ANTERIOR CERVICAL DECOMPRESSION FUSION CERVICAL 7 - THORACIC 1 WITH INSTRUMENTATION AND ALLOGRAFT  Date of procedure: 08/14/23  PE 2019, DVT 2020   CrCl 62 ml/min Platelet count 177  Per office protocol, patient can hold warfarin for 5 days prior to procedure.    Patient WILL need bridging with Lovenox (enoxaparin) around procedure.  Lovenox will be coordinated by coumadin clinic.  **This guidance is not considered finalized until pre-operative APP has relayed final recommendations.**

## 2023-08-05 ENCOUNTER — Encounter: Payer: Self-pay | Admitting: Cardiology

## 2023-08-05 NOTE — Pre-Procedure Instructions (Signed)
Surgical Instructions   Your procedure is scheduled on Thursday, November 14th. Report to [facility] Main Entrance "A" at 5:30 A.M., then check in with the Admitting office. Any questions or running late day of surgery: call [phone]  Questions prior to your surgery date: call [phone], Monday-Friday, 8am-4pm. If you experience any cold or flu symptoms such as cough, fever, chills, shortness of breath, etc. between now and your scheduled surgery, please notify us at the above number.     Remember:  Do not eat after midnight the night before your surgery  You may drink clear liquids until 4:30 a.m. the morning of your surgery.   Clear liquids allowed are: Water, Non-Citrus Juices (without pulp), Carbonated Beverages, Clear Tea (no additives), Black Coffee Only (NO MILK, CREAM OR POWDERED CREAMER of any kind), and Gatorade.  Patient Instructions   The day of surgery (if you have diabetes):  Drink ONE small 12 oz bottle of Gatorade G2 by 4:30 am the morning of surgery This bottle was given to you during your hospital  pre-op appointment visit.  Nothing else to drink after completing the  Small 12 oz bottle of Gatorade G2.         If you have questions, please contact your surgeon's office.     Take these medicines the morning of surgery with A SIP OF WATER  atorvastatin (LIPITOR)  carvedilol (COREG)  dicyclomine (BENTYL)  doxycycline (VIBRA-TABS)  levofloxacin (LEVAQUIN)  loratadine (CLARITIN)  metroNIDAZOLE (FLAGYL)  Tafamidis (VYNDAMAX)   May take these medicines IF NEEDED: acetaminophen (TYLENOL)  albuterol (VENTOLIN HFA) 108 (90 Base) MCG/ACT inhaler  fluticasone (FLONASE)  gabapentin (NEURONTIN)  methocarbamol (ROBAXIN)  omeprazole (PRILOSEC OTC)  ondansetron (ZOFRAN)  tiZANidine (ZANAFLEX)   Per your cardiologist, HOLD warfarin (COUMADIN) for 5 days pre-op. Follow the instructions for your enoxaparin (LOVENOX) bridge until surgery.   One week prior to  surgery, STOP taking any Aspirin (unless otherwise instructed by your surgeon) Aleve, Naproxen, Ibuprofen, Motrin, Advil, Goody's, BC's, all herbal medications, fish oil, and non-prescription vitamins. This includes: diclofenac Sodium (VOLTAREN) 1 % GEL.   WHAT DO I DO ABOUT MY DIABETES MEDICATION?  HOLD dapagliflozin propanediol (FARXIGA) for 3 days (72 hours) before surgery. Last dose should be Sunday, 08/10/23.  THE MORNING OF SURGERY, DO NOT TAKE metFORMIN (GLUCOPHAGE).   HOW TO MANAGE YOUR DIABETES BEFORE AND AFTER SURGERY  Why is it important to control my blood sugar before and after surgery? Improving blood sugar levels before and after surgery helps healing and can limit problems. A way of improving blood sugar control is eating a healthy diet by:  Eating less sugar and carbohydrates  Increasing activity/exercise  Talking with your doctor about reaching your blood sugar goals High blood sugars (greater than 180 mg/dL) can raise your risk of infections and slow your recovery, so you will need to focus on controlling your diabetes during the weeks before surgery. Make sure that the doctor who takes care of your diabetes knows about your planned surgery including the date and location.  How do I manage my blood sugar before surgery? Check your blood sugar at least 4 times a day, starting 2 days before surgery, to make sure that the level is not too high or low.  Check your blood sugar the morning of your surgery when you wake up and every 2 hours until you get to the Short Stay unit.  If your blood sugar is less than 70 mg/dL, you will need to treat for  low blood sugar: Do not take insulin. Treat a low blood sugar (less than 70 mg/dL) with  cup of clear juice (cranberry or apple), 4 glucose tablets, OR glucose gel. Recheck blood sugar in 15 minutes after treatment (to make sure it is greater than 70 mg/dL). If your blood sugar is not greater than 70 mg/dL on recheck, call  [phone] for further instructions. Report your blood sugar to the short stay nurse when you get to Short Stay.  If you are admitted to the hospital after surgery: Your blood sugar will be checked by the staff and you will probably be given insulin after surgery (instead of oral diabetes medicines) to make sure you have good blood sugar levels. The goal for blood sugar control after surgery is 80-180 mg/dL.                       Do NOT Smoke (Tobacco/Vaping) for 24 hours prior to your procedure.  If you use a CPAP at night, you may bring your mask/headgear for your overnight stay.   You will be asked to remove any contacts, glasses, piercing's, hearing aid's, dentures/partials prior to surgery. Please bring cases for these items if needed.    Patients discharged the day of surgery will not be allowed to drive home, and someone needs to stay with them for 24 hours.  SURGICAL WAITING ROOM VISITATION Patients may have no more than 2 support people in the waiting area - these visitors may rotate.   Pre-op nurse will coordinate an appropriate time for 1 ADULT support person, who may not rotate, to accompany patient in pre-op.  Children under the age of 86 must have an adult with them who is not the patient and must remain in the main waiting area with an adult.  If the patient needs to stay at the hospital during part of their recovery, the visitor guidelines for inpatient rooms apply.  Please refer to the [facility] website for the visitor guidelines for any additional information.   If you received a COVID test during your pre-op visit  it is requested that you wear a mask when out in public, stay away from anyone that may not be feeling well and notify your surgeon if you develop symptoms. If you have been in contact with anyone that has tested positive in the last 10 days please notify you surgeon.      Pre-operative 5 CHG Bathing Instructions   You can play a key role in  reducing the risk of infection after surgery. Your skin needs to be as free of germs as possible. You can reduce the number of germs on your skin by washing with CHG (chlorhexidine gluconate) soap before surgery. CHG is an antiseptic soap that kills germs and continues to kill germs even after washing.   DO NOT use if you have an allergy to chlorhexidine/CHG or antibacterial soaps. If your skin becomes reddened or irritated, stop using the CHG and notify one of our RNs at [phone].   Please shower with the CHG soap starting 4 days before surgery using the following schedule:     Please keep in mind the following:  DO NOT shave, including legs and underarms, starting the day of your first shower.   You may shave your face at any point before/day of surgery.  Place clean sheets on your bed the day you start using CHG soap. Use a clean washcloth (not used since being washed) for each  shower. DO NOT sleep with pets once you start using the CHG.   CHG Shower Instructions:  Wash your face and private area with normal soap. If you choose to wash your hair, wash first with your normal shampoo.  After you use shampoo/soap, rinse your hair and body thoroughly to remove shampoo/soap residue.  Turn the water OFF and apply about 3 tablespoons (45 ml) of CHG soap to a CLEAN washcloth.  Apply CHG soap ONLY FROM YOUR NECK DOWN TO YOUR TOES (washing for 3-5 minutes)  DO NOT use CHG soap on face, private areas, open wounds, or sores.  Pay special attention to the area where your surgery is being performed.  If you are having back surgery, having someone wash your back for you may be helpful. Wait 2 minutes after CHG soap is applied, then you may rinse off the CHG soap.  Pat dry with a clean towel  Put on clean clothes/pajamas   If you choose to wear lotion, please use ONLY the CHG-compatible lotions on the back of this paper.   Additional instructions for the day of surgery: DO NOT APPLY any lotions,  deodorants, cologne, or perfumes.   Do not bring valuables to the hospital. [facility] is not responsible for any belongings/valuables. Do not wear nail polish, gel polish, artificial nails, or any other type of covering on natural nails (fingers and toes) Do not wear jewelry or makeup Put on clean/comfortable clothes.  Please brush your teeth.  Ask your nurse before applying any prescription medications to the skin.     CHG Compatible Lotions   Aveeno Moisturizing lotion  Cetaphil Moisturizing Cream  Cetaphil Moisturizing Lotion  Clairol Herbal Essence Moisturizing Lotion, Dry Skin  Clairol Herbal Essence Moisturizing Lotion, Extra Dry Skin  Clairol Herbal Essence Moisturizing Lotion, Normal Skin  Curel Age Defying Therapeutic Moisturizing Lotion with Alpha Hydroxy  Curel Extreme Care Body Lotion  Curel Soothing Hands Moisturizing Hand Lotion  Curel Therapeutic Moisturizing Cream, Fragrance-Free  Curel Therapeutic Moisturizing Lotion, Fragrance-Free  Curel Therapeutic Moisturizing Lotion, Original Formula  Eucerin Daily Replenishing Lotion  Eucerin Dry Skin Therapy Plus Alpha Hydroxy Crme  Eucerin Dry Skin Therapy Plus Alpha Hydroxy Lotion  Eucerin Original Crme  Eucerin Original Lotion  Eucerin Plus Crme Eucerin Plus Lotion  Eucerin TriLipid Replenishing Lotion  Keri Anti-Bacterial Hand Lotion  Keri Deep Conditioning Original Lotion Dry Skin Formula Softly Scented  Keri Deep Conditioning Original Lotion, Fragrance Free Sensitive Skin Formula  Keri Lotion Fast Absorbing Fragrance Free Sensitive Skin Formula  Keri Lotion Fast Absorbing Softly Scented Dry Skin Formula  Keri Original Lotion  Keri Skin Renewal Lotion Keri Silky Smooth Lotion  Keri Silky Smooth Sensitive Skin Lotion  Nivea Body Creamy Conditioning Oil  Nivea Body Extra Enriched Lotion  Nivea Body Original Lotion  Nivea Body Sheer Moisturizing Lotion Nivea Crme  Nivea Skin Firming Lotion  NutraDerm 30  Skin Lotion  NutraDerm Skin Lotion  NutraDerm Therapeutic Skin Cream  NutraDerm Therapeutic Skin Lotion  ProShield Protective Hand Cream  Provon moisturizing lotion  Please read over the following fact sheets that you were given.

## 2023-08-05 NOTE — Telephone Encounter (Signed)
Requesting office sent a duplicate request. I will send an update the pt is scheduled for tele pre op appt 08/07/23. Our office did only receive the request yesterday 08/04/23. Pt is on a blood thinner that will need to be held and bridged with Lovenox.

## 2023-08-05 NOTE — Progress Notes (Signed)
PERIOPERATIVE PRESCRIPTION FOR IMPLANTED CARDIAC DEVICE PROGRAMMING   Patient Information:  Patient: Jonathan Dickson  MRN: [id number]  Date of Birth: Aug 01, 1956    Planned Procedure: Anterior Cervical Decompression Fusion, C7-T1, with instrumentation and allograft.  Surgeon:  Dr. Estill Bamberg  Date of Procedure:  08/14/23  Cautery will be used.  Position during surgery:  Supine   Please send documentation back to:  [facility] (Fax # [phone])  Device Information:   Clinic EP Physician:   Dr. Loman Brooklyn Device Type:  Defibrillator Manufacturer and Phone #:  St. Jude/Abbott: [phone] Pacemaker Dependent?:  No Date of Last Device Check:  11/08/2022        Normal Device Function?:  Yes     Electrophysiologist's Recommendations:   Have magnet available. Provide continuous ECG monitoring when magnet is used or reprogramming is to be performed.  Procedure will likely interfere with device function.  Device should be programmed:  Tachy therapies disabled  Per Device Clinic Standing Orders, Collene Schlichter  08/05/2023 1:32 PM

## 2023-08-06 ENCOUNTER — Other Ambulatory Visit: Payer: Self-pay

## 2023-08-06 ENCOUNTER — Encounter (HOSPITAL_COMMUNITY)
Admission: RE | Admit: 2023-08-06 | Discharge: 2023-08-06 | Disposition: A | Discharge status: Home / Self Care | Payer: 59 | Source: Ambulatory Visit | Attending: Orthopedic Surgery | Admitting: Orthopedic Surgery

## 2023-08-06 ENCOUNTER — Other Ambulatory Visit (HOSPITAL_COMMUNITY): Payer: Self-pay | Admitting: Family Medicine

## 2023-08-06 ENCOUNTER — Encounter (HOSPITAL_COMMUNITY): Payer: Self-pay

## 2023-08-06 VITALS — BP 106/69 | HR 100 | Temp 97.5°F | Resp 17 | Ht 66.0 in | Wt 162.4 lb

## 2023-08-06 DIAGNOSIS — M5412 Radiculopathy, cervical region: Secondary | ICD-10-CM | POA: Diagnosis not present

## 2023-08-06 DIAGNOSIS — Z01818 Encounter for other preprocedural examination: Secondary | ICD-10-CM

## 2023-08-06 DIAGNOSIS — K449 Diaphragmatic hernia without obstruction or gangrene: Secondary | ICD-10-CM | POA: Diagnosis not present

## 2023-08-06 DIAGNOSIS — Z95810 Presence of automatic (implantable) cardiac defibrillator: Secondary | ICD-10-CM | POA: Diagnosis not present

## 2023-08-06 DIAGNOSIS — I5042 Chronic combined systolic (congestive) and diastolic (congestive) heart failure: Secondary | ICD-10-CM | POA: Insufficient documentation

## 2023-08-06 DIAGNOSIS — Z7901 Long term (current) use of anticoagulants: Secondary | ICD-10-CM | POA: Insufficient documentation

## 2023-08-06 DIAGNOSIS — E119 Type 2 diabetes mellitus without complications: Secondary | ICD-10-CM | POA: Insufficient documentation

## 2023-08-06 DIAGNOSIS — E854 Organ-limited amyloidosis: Secondary | ICD-10-CM | POA: Diagnosis not present

## 2023-08-06 DIAGNOSIS — I43 Cardiomyopathy in diseases classified elsewhere: Secondary | ICD-10-CM | POA: Diagnosis not present

## 2023-08-06 DIAGNOSIS — Z87891 Personal history of nicotine dependence: Secondary | ICD-10-CM | POA: Diagnosis not present

## 2023-08-06 DIAGNOSIS — Z01812 Encounter for preprocedural laboratory examination: Secondary | ICD-10-CM | POA: Diagnosis not present

## 2023-08-06 DIAGNOSIS — I428 Other cardiomyopathies: Secondary | ICD-10-CM | POA: Insufficient documentation

## 2023-08-06 DIAGNOSIS — I110 Hypertensive heart disease with heart failure: Secondary | ICD-10-CM | POA: Diagnosis not present

## 2023-08-06 HISTORY — DX: Other psychoactive substance abuse, uncomplicated: F19.10

## 2023-08-06 LAB — SURGICAL PCR SCREEN
MRSA, PCR: NEGATIVE
Staphylococcus aureus: NEGATIVE

## 2023-08-06 LAB — GLUCOSE, CAPILLARY: Glucose-Capillary: 109 mg/dL — ABNORMAL HIGH (ref 70–99)

## 2023-08-06 NOTE — Progress Notes (Signed)
PCP - Bertram Denver, NP Cardiologist - Dr Nanetta Batty EP - Dr will [facility]  Chest x-ray - 07/31/23 EKG - 11/08/22 Stress Test - 04/03/18 ECHO - 05/09/21 Cardiac Cath - 03/11/16  ICD Pacemaker - St Jude, last device check was on 06/24/23.  Device orders faxed by Lequita Halt, RN, awaiting results.  Sleep Study -  n/a  Diabetes Type 2 - tx with Metformin, none DOS Fasting Blood Sugar - unknown Checks Blood Sugar - Patient does not check  blood sugar  Blood Thinner Instructions:  Hold Coumadin for 5 days.  Lovenox Bridge - follow instructions given by the coumadin clinic.  Patient states "he was given these instructions".  Last dose of Marcelline Deist will be on 08/10/23.    ERAS Protcol - PRE-SURGERY G2 Drinik was given at PAT appt with instructions.     Anesthesia review: Yes  STOP now taking any Aspirin (unless otherwise instructed by your surgeon), Aleve, Naproxen, Ibuprofen, Motrin, Advil, Goody's, BC's, all herbal medications, Voltaren, fish oil, and all vitamins.   Coronavirus Screening Do you have any of the following symptoms:  Cough yes/no: No Fever (>100.56F)  yes/no: No Runny nose yes/no: No Sore throat yes/no: No Difficulty breathing/shortness of breath  yes/no: No  Have you traveled in the last 14 days and where? yes/no: No  Patient verbalized understanding of instructions that were given to them at the PAT appointment. Patient was also instructed that they will need to review over the PAT instructions again at home before surgery.

## 2023-08-06 NOTE — Pre-Procedure Instructions (Signed)
Surgical Instructions   Your procedure is scheduled on Thursday, November 14th. Report to [facility] Main Entrance "A" at 5:30 A.M., then check in with the Admitting office. Any questions or running late day of surgery: call [phone]  Questions prior to your surgery date: call [phone], Monday-Friday, 8am-4pm. If you experience any cold or flu symptoms such as cough, fever, chills, shortness of breath, etc. between now and your scheduled surgery, please notify us at the above number.     Remember:  Do not eat after midnight the night before your surgery  You may drink clear liquids until 4:30 a.m. the morning of your surgery.   Clear liquids allowed are: Water, Non-Citrus Juices (without pulp), Carbonated Beverages, Clear Tea (no additives), Black Coffee Only (NO MILK, CREAM OR POWDERED CREAMER of any kind), and Gatorade.  Patient Instructions   The day of surgery (if you have diabetes):  Drink ONE small 12 oz bottle of Gatorade G2 by 4:30 am the morning of surgery This bottle was given to you during your hospital  pre-op appointment visit.  Nothing else to drink after completing the  Small 12 oz bottle of Gatorade G2.         If you have questions, please contact your surgeon's office.     Take these medicines the morning of surgery with A SIP OF WATER  atorvastatin (LIPITOR)  carvedilol (COREG)  dicyclomine (BENTYL)  levofloxacin (LEVAQUIN)  loratadine (CLARITIN)  Tafamidis [facility])   May take these medicines IF NEEDED: acetaminophen (TYLENOL)  albuterol (VENTOLIN HFA) 108 (90 Base) MCG/ACT inhaler  fluticasone (FLONASE)  gabapentin (NEURONTIN)  methocarbamol (ROBAXIN)  omeprazole (PRILOSEC OTC)  ondansetron (ZOFRAN)  tiZANidine (ZANAFLEX)   Per your cardiologist, HOLD warfarin (COUMADIN) for 5 days pre-op. Follow the instructions for your enoxaparin (LOVENOX) bridge until surgery.   One week prior to surgery, STOP taking any Aspirin (unless otherwise  instructed by your surgeon) Aleve, Naproxen, Ibuprofen, Motrin, Advil, Goody's, BC's, all herbal medications, fish oil, and non-prescription vitamins. This includes: diclofenac Sodium (VOLTAREN) 1 % GEL.   WHAT DO I DO ABOUT MY DIABETES MEDICATION?  HOLD dapagliflozin propanediol (FARXIGA) for 3 days (72 hours) before surgery. Last dose should be Sunday, 08/10/23.  THE MORNING OF SURGERY, DO NOT TAKE metFORMIN (GLUCOPHAGE).   HOW TO MANAGE YOUR DIABETES BEFORE AND AFTER SURGERY  Why is it important to control my blood sugar before and after surgery? Improving blood sugar levels before and after surgery helps healing and can limit problems. A way of improving blood sugar control is eating a healthy diet by:  Eating less sugar and carbohydrates  Increasing activity/exercise  Talking with your doctor about reaching your blood sugar goals High blood sugars (greater than 180 mg/dL) can raise your risk of infections and slow your recovery, so you will need to focus on controlling your diabetes during the weeks before surgery. Make sure that the doctor who takes care of your diabetes knows about your planned surgery including the date and location.  How do I manage my blood sugar before surgery? Check your blood sugar at least 4 times a day, starting 2 days before surgery, to make sure that the level is not too high or low.  Check your blood sugar the morning of your surgery when you wake up and every 2 hours until you get to the Short Stay unit.  If your blood sugar is less than 70 mg/dL, you will need to treat for low blood sugar: Do not take  insulin. Treat a low blood sugar (less than 70 mg/dL) with  cup of clear juice (cranberry or apple), 4 glucose tablets, OR glucose gel. Recheck blood sugar in 15 minutes after treatment (to make sure it is greater than 70 mg/dL). If your blood sugar is not greater than 70 mg/dL on recheck, call [phone] for further instructions. Report your blood  sugar to the short stay nurse when you get to Short Stay.  If you are admitted to the hospital after surgery: Your blood sugar will be checked by the staff and you will probably be given insulin after surgery (instead of oral diabetes medicines) to make sure you have good blood sugar levels. The goal for blood sugar control after surgery is 80-180 mg/dL.                       Do NOT Smoke (Tobacco/Vaping) for 24 hours prior to your procedure.  If you use a CPAP at night, you may bring your mask/headgear for your overnight stay.   You will be asked to remove any contacts, glasses, piercing's, hearing aid's, dentures/partials prior to surgery. Please bring cases for these items if needed.    Patients discharged the day of surgery will not be allowed to drive home, and someone needs to stay with them for 24 hours.  SURGICAL WAITING ROOM VISITATION Patients may have no more than 2 support people in the waiting area - these visitors may rotate.   Pre-op nurse will coordinate an appropriate time for 1 ADULT support person, who may not rotate, to accompany patient in pre-op.  Children under the age of 62 must have an adult with them who is not the patient and must remain in the main waiting area with an adult.  If the patient needs to stay at the hospital during part of their recovery, the visitor guidelines for inpatient rooms apply.  Please refer to the [facility] website for the visitor guidelines for any additional information.   If you received a COVID test during your pre-op visit  it is requested that you wear a mask when out in public, stay away from anyone that may not be feeling well and notify your surgeon if you develop symptoms. If you have been in contact with anyone that has tested positive in the last 10 days please notify you surgeon.      Pre-operative 5 CHG Bathing Instructions   You can play a key role in reducing the risk of infection after surgery. Your skin needs to  be as free of germs as possible. You can reduce the number of germs on your skin by washing with CHG (chlorhexidine gluconate) soap before surgery. CHG is an antiseptic soap that kills germs and continues to kill germs even after washing.   DO NOT use if you have an allergy to chlorhexidine/CHG or antibacterial soaps. If your skin becomes reddened or irritated, stop using the CHG and notify one of our RNs at [phone].   Please shower with the CHG soap starting 4 days before surgery using the following schedule:     Please keep in mind the following:  DO NOT shave, including legs and underarms, starting the day of your first shower.   You may shave your face at any point before/day of surgery.  Place clean sheets on your bed the day you start using CHG soap. Use a clean washcloth (not used since being washed) for each shower. DO NOT sleep with pets  once you start using the CHG.   CHG Shower Instructions:  Wash your face and private area with normal soap. If you choose to wash your hair, wash first with your normal shampoo.  After you use shampoo/soap, rinse your hair and body thoroughly to remove shampoo/soap residue.  Turn the water OFF and apply about 3 tablespoons (45 ml) of CHG soap to a CLEAN washcloth.  Apply CHG soap ONLY FROM YOUR NECK DOWN TO YOUR TOES (washing for 3-5 minutes)  DO NOT use CHG soap on face, private areas, open wounds, or sores.  Pay special attention to the area where your surgery is being performed.  If you are having back surgery, having someone wash your back for you may be helpful. Wait 2 minutes after CHG soap is applied, then you may rinse off the CHG soap.  Pat dry with a clean towel  Put on clean clothes/pajamas   If you choose to wear lotion, please use ONLY the CHG-compatible lotions on the back of this paper.   Additional instructions for the day of surgery: DO NOT APPLY any lotions, deodorants, cologne, or perfumes.   Do not bring valuables to  the hospital. [facility] is not responsible for any belongings/valuables. Do not wear nail polish, gel polish, artificial nails, or any other type of covering on natural nails (fingers and toes) Do not wear jewelry or makeup Put on clean/comfortable clothes.  Please brush your teeth.  Ask your nurse before applying any prescription medications to the skin.     CHG Compatible Lotions   Aveeno Moisturizing lotion  Cetaphil Moisturizing Cream  Cetaphil Moisturizing Lotion  Clairol Herbal Essence Moisturizing Lotion, Dry Skin  Clairol Herbal Essence Moisturizing Lotion, Extra Dry Skin  Clairol Herbal Essence Moisturizing Lotion, Normal Skin  Curel Age Defying Therapeutic Moisturizing Lotion with Alpha Hydroxy  Curel Extreme Care Body Lotion  Curel Soothing Hands Moisturizing Hand Lotion  Curel Therapeutic Moisturizing Cream, Fragrance-Free  Curel Therapeutic Moisturizing Lotion, Fragrance-Free  Curel Therapeutic Moisturizing Lotion, Original Formula  Eucerin Daily Replenishing Lotion  Eucerin Dry Skin Therapy Plus Alpha Hydroxy Crme  Eucerin Dry Skin Therapy Plus Alpha Hydroxy Lotion  Eucerin Original Crme  Eucerin Original Lotion  Eucerin Plus Crme Eucerin Plus Lotion  Eucerin TriLipid Replenishing Lotion  Keri Anti-Bacterial Hand Lotion  Keri Deep Conditioning Original Lotion Dry Skin Formula Softly Scented  Keri Deep Conditioning Original Lotion, Fragrance Free Sensitive Skin Formula  Keri Lotion Fast Absorbing Fragrance Free Sensitive Skin Formula  Keri Lotion Fast Absorbing Softly Scented Dry Skin Formula  Keri Original Lotion  Keri Skin Renewal Lotion Keri Silky Smooth Lotion  Keri Silky Smooth Sensitive Skin Lotion  Nivea Body Creamy Conditioning Oil  Nivea Body Extra Enriched Lotion  Nivea Body Original Lotion  Nivea Body Sheer Moisturizing Lotion Nivea Crme  Nivea Skin Firming Lotion  NutraDerm 30 Skin Lotion  NutraDerm Skin Lotion  NutraDerm Therapeutic Skin  Cream  NutraDerm Therapeutic Skin Lotion  ProShield Protective Hand Cream  Provon moisturizing lotion  Please read over the following fact sheets that you were given.

## 2023-08-07 ENCOUNTER — Ambulatory Visit: Payer: 59 | Attending: Nurse Practitioner

## 2023-08-07 DIAGNOSIS — Z01810 Encounter for preprocedural cardiovascular examination: Secondary | ICD-10-CM
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
## 2023-08-07 NOTE — Progress Notes (Signed)
Patient was a no-show for today's virtual visit.  He was contacted 4 times with voicemails left on both contact numbers.  Please reschedule patient for future visit.  Patient's surgery is scheduled for 08/14/2023.

## 2023-08-08 ENCOUNTER — Telehealth: Payer: Self-pay | Admitting: *Deleted

## 2023-08-08 ENCOUNTER — Ambulatory Visit: Payer: 59 | Attending: Nurse Practitioner

## 2023-08-08 DIAGNOSIS — Z01810 Encounter for preprocedural cardiovascular examination: Secondary | ICD-10-CM | POA: Diagnosis not present

## 2023-08-08 NOTE — Telephone Encounter (Signed)
Patient is returning call and is requesting call back.  

## 2023-08-08 NOTE — Telephone Encounter (Signed)
Pt called back. I s/w the pt and he has been rescheduled for tele pre op appt today add on per Robin Searing, NP 4 pm.   I will update Dr. Yevette Edwards office as well the pt has called back. Once the pt has been cleared our office will be sure to fax clearance notes.

## 2023-08-08 NOTE — Telephone Encounter (Signed)
I called the pt and left detailed message to call back to reschedule his tele appt that he missed yesterday for pre op clearance. Per Zonia Kief, NP to add pt to the end of the day at 4 pm. I will also update the requesting office as well.

## 2023-08-08 NOTE — Progress Notes (Signed)
Virtual Visit via Telephone Note   Because of Jonathan Dickson's co-morbid illnesses, he is at least at moderate risk for complications without adequate follow up.  This format is felt to be most appropriate for this patient at this time.  The patient did not have access to video technology/had technical difficulties with video requiring transitioning to audio format only (telephone).  All issues noted in this document were discussed and addressed.  No physical exam could be performed with this format.  Please refer to the patient's chart for his consent to telehealth for [facility].  Evaluation Performed:  Preoperative cardiovascular risk assessment _____________   Date:  08/08/2023   Patient ID:  Jonathan Dickson, DOB 1956-07-23, MRN [id number] Patient Location:  Home Provider location:   Office  Primary Care Provider:  Claiborne Rigg, NP Primary Cardiologist:  Nanetta Batty, MD  Chief Complaint / Patient Profile   67 y.o. y/o male with a h/o NICM s/p [employer] Jude ICD TTR cardiac amyloidosis HFrEF, HTN,, PE, DM type II polysubstance abuse who is pending anterior cervical fusion with decompression and presents today for telephonic preoperative cardiovascular risk assessment.  History of Present Illness    Jonathan Dickson is a 67 y.o. male who presents via audio/video conferencing for a telehealth visit today.  Pt was last seen in cardiology clinic on 11/18/2022 by Prince Rome, FNP.  At that time Jonathan Dickson was doing well overall and reported some nasal congestion and dyspnea at night.  He has been compliant with medications and was not needing Lasix.  The patient is now pending procedure as outlined above. Since his last visit, he has been doing well with no new cardiac complaints or concerns.  He reports not needing his Lasix and has been staying active with walking.  He denies chest pain, shortness of breath, lower extremity edema, fatigue, palpitations, melena, hematuria,  hemoptysis, diaphoresis, weakness, presyncope, syncope, orthopnea, and PND.   Device clinic is aware  Past Medical History    Past Medical History:  Diagnosis Date   Abnormal TSH 02/2016   AICD (automatic cardioverter/defibrillator) present    St Jude   Anxiety    Blood clotting disorder (HCC)    Bronchiectasis (HCC)    left lower lung   Cardiac amyloidosis (HCC)    Cervical disc disease    Chronic combined systolic and diastolic CHF (congestive heart failure) (HCC)    a. 01/2016 Echo: EF 25%, inf AK, diffuse sev HK, Gr1 DD, mild MR.   Coronary atherosclerosis    Depression    Diabetes mellitus without complication (HCC)    type 2   Dyspnea    with exertion   Dysrhythmia    Full thickness rotator cuff tear 06/2021   MRI   History of DVT (deep vein thrombosis) 08/04/2019   venous doppler US   History of hiatal hernia    Hx of adenomatous polyp of colon    Hyperglycemia    Hyperlipidemia    Hypertension    Hypertensive heart disease    Myocardial infarction (HCC)    greater than 5 yrs per patient on 08/06/23   NICM (nonischemic cardiomyopathy) (HCC)    a. 01/2016 Echo: EF 25%, inf AK, diffuse sev HK, Gr1 DD, mild MR; b. 01/2016 MV: EF 22%, no isch/infarct;  c. 02/2016 Cath: Nl cors, EF 35-45%.   Osteoarthritis of hips, bilateral    and hands   Paraseptal emphysema (HCC)    mild  Peanut allergy    Pneumonia    x 1   Pre-diabetes    hx but now DM 2   Presence of implantable cardioverter-defibrillator (ICD)    St Jude   S/P repair of ventral hernia    Seizures (HCC)    allergic with penicillin - only one seizure   Sensorineural hearing loss 11/25/2017   no hearing aids, patient denies this dx   Substance abuse (HCC)    none in 20 yrs per patient as of 08/06/23   Past Surgical History:  Procedure Laterality Date   ANTERIOR CERVICAL DECOMP/DISCECTOMY FUSION N/A 07/10/2016   Procedure: ANTERIOR CERVICAL DECOMPRESSION FUSION CERVICAL 4-5, CERVICAL 5-6, CERVICAL 6-7  WITH INSTRUMENTATION AND ALLOGRAFT;  Surgeon: Estill Bamberg, MD;  Location: [facility] OR;  Service: Orthopedics;  Laterality: N/A;   ANTERIOR CERVICAL DECOMP/DISCECTOMY FUSION N/A 04/19/2021   Procedure: ANTERIOR CERVICAL DECOMPRESSION FUSION CERVICAL 3- CERVICAL 4 WITH INSTRUMENTATION AND ALLOGRAFT;  Surgeon: Estill Bamberg, MD;  Location: [facility] OR;  Service: Orthopedics;  Laterality: N/A;   BIOPSY  07/28/2023   Procedure: BIOPSY;  Surgeon: Benancio Deeds, MD;  Location: [facility];  Service: Gastroenterology;;   CARDIAC CATHETERIZATION N/A 03/11/2016   Procedure: Left Heart Cath and Coronary Angiography;  Surgeon: Marykay Lex, MD;  Location: [facility] INVASIVE CV LAB;  Service: Cardiovascular;  Laterality: N/A;   COLONOSCOPY     COLONOSCOPY WITH PROPOFOL N/A 07/28/2023   Procedure: COLONOSCOPY WITH PROPOFOL;  Surgeon: Benancio Deeds, MD;  Location: [facility];  Service: Gastroenterology;  Laterality: N/A;   ESOPHAGOGASTRODUODENOSCOPY (EGD) WITH PROPOFOL N/A 07/28/2023   Procedure: ESOPHAGOGASTRODUODENOSCOPY (EGD) WITH PROPOFOL;  Surgeon: Benancio Deeds, MD;  Location: [facility];  Service: Gastroenterology;  Laterality: N/A;   HOT HEMOSTASIS N/A 07/28/2023   Procedure: HOT HEMOSTASIS (ARGON PLASMA COAGULATION/BICAP);  Surgeon: Benancio Deeds, MD;  Location: [facility];  Service: Gastroenterology;  Laterality: N/A;   ICD IMPLANT N/A 06/17/2018   Procedure: ICD IMPLANT;  Surgeon: Regan Lemming, MD;  Location: [facility] INVASIVE CV LAB;  Service: Cardiovascular;  Laterality: N/A;   POLYPECTOMY  07/28/2023   Procedure: POLYPECTOMY;  Surgeon: Benancio Deeds, MD;  Location: [facility];  Service: Gastroenterology;;   Thumb surgery Right    VENTRAL HERNIA REPAIR N/A 10/25/2019   Procedure: PRIMARY VENTRAL HERNIA REPAIR;  Surgeon: Berna Bue, MD;  Location: [facility] ORS;  Service: General;  Laterality: N/A;    Allergies  Allergies  Allergen Reactions    Peanut-Containing Drug Products Swelling   Penicillins Other (See Comments)    CONVULSIONS    Decadron [Dexamethasone] Itching   Ivp Dye [Iodinated Contrast Media]     Home Medications    Prior to Admission medications   Medication Sig Start Date End Date Taking? Authorizing Provider  acetaminophen (TYLENOL) 500 MG tablet Take 1,000 mg by mouth every 6 (six) hours as needed for moderate pain or headache.    [provider]  albuterol (VENTOLIN HFA) 108 (90 Base) MCG/ACT inhaler INHALE 2 PUFFS INTO THE LUNGS EVERY 6 HOURS AS NEEDED FOR WHEEZING OR SHORTNESS OF BREATH 05/02/23   Parrett, Virgel Bouquet, NP  Aromatic Inhalants (VICKS VAPOINHALER) INHA Inhale 1 Dose into the lungs daily as needed (congestion).    [provider]  atorvastatin (LIPITOR) 80 MG tablet Take 1 tablet (80 mg total) by mouth daily. 02/05/23   Claiborne Rigg, NP  bismuth subsalicylate (PEPTO-BISMOL) 262 MG chewable tablet Chew 2 tablets (524 mg total) by mouth 4 (four) times  daily for 14 days. 08/01/23 08/15/23  Armbruster, Willaim Rayas, MD  carvedilol (COREG) 12.5 MG tablet Take 1 tablet (12.5 mg total) by mouth 2 (two) times daily with a meal. 11/18/22   Milford, Anderson Malta, FNP  Chlorphen-Phenyleph-ASA (ALKA-SELTZER PLUS COLD PO) Take 1-2 tablets by mouth 2 (two) times daily as needed (cold symptoms).     [provider]  cholecalciferol (VITAMIN D3) 25 MCG (1000 UNIT) tablet Take 1,000 Units by mouth daily. 11/25/17   [provider]  dapagliflozin propanediol (FARXIGA) 10 MG TABS tablet Take 1 tablet (10 mg total) by mouth daily. 12/03/22   Bensimhon, Bevelyn Buckles, MD  diclofenac Sodium (VOLTAREN) 1 % GEL Apply 2 g topically 4 (four) times daily as needed (pain).    [provider]  dicyclomine (BENTYL) 20 MG tablet Take 1 tablet (20 mg total) by mouth 2 (two) times daily. 07/31/23   Dartha Lodge, PA-C  doxycycline (VIBRA-TABS) 100 MG tablet Take 1 tablet (100 mg total) by mouth 2 (two) times  daily for 14 days. 08/01/23 08/15/23  Armbruster, Willaim Rayas, MD  enoxaparin (LOVENOX) 80 MG/0.8ML injection Inject 0.8 mLs (80 mg total) into the skin every 12 (twelve) hours. 08/04/23   Bensimhon, Bevelyn Buckles, MD  fluticasone (FLONASE) 50 MCG/ACT nasal spray SHAKE LIQUID AND USE 1 SPRAY IN EACH NOSTRIL DAILY Patient taking differently: Place 1 spray into both nostrils daily as needed for allergies. 04/17/23   Cobb, Ruby Cola, NP  gabapentin (NEURONTIN) 300 MG capsule Take 300 mg by mouth daily as needed. 07/07/23   [provider]  loratadine (CLARITIN) 10 MG tablet Take 1 tablet (10 mg total) by mouth daily. Patient taking differently: Take 10 mg by mouth daily as needed for allergies. 02/05/23   Claiborne Rigg, NP  metFORMIN (GLUCOPHAGE) 500 MG tablet TAKE 1 TABLET(500 MG) BY MOUTH DAILY WITH BREAKFAST 08/01/23   Claiborne Rigg, NP  methocarbamol (ROBAXIN) 500 MG tablet Take 1 tablet (500 mg total) by mouth every 8 (eight) hours as needed for muscle spasms. 05/05/23   Sabas Sous, MD  metroNIDAZOLE (FLAGYL) 250 MG tablet Take 1 tablet (250 mg total) by mouth 4 (four) times daily for 14 days. 08/01/23 08/15/23  Armbruster, Willaim Rayas, MD  Misc. Devices MISC by Does not apply route. Flutter valve daily    [provider]  omeprazole (PRILOSEC OTC) 20 MG tablet Take 1 tablet (20 mg total) by mouth daily. Take 1 tablet daily for the next 2 weeks and then as needed for reflux 07/28/23 07/27/24  Armbruster, Willaim Rayas, MD  ondansetron (ZOFRAN) 4 MG tablet Take 1 tablet (4 mg total) by mouth every 6 (six) hours. 07/31/23   Dartha Lodge, PA-C  sacubitril-valsartan (ENTRESTO) 49-51 MG Take 1 tablet by mouth 2 (two) times daily. NEEDS FOLLOW UP APPOINTMENT FOR MORE REFILLS 08/06/23   Jacklynn Ganong, FNP  spironolactone (ALDACTONE) 25 MG tablet Take 0.5 tablets (12.5 mg total) by mouth daily. TAKE 1/2 TABLET(12.5 MG) BY MOUTH DAILY 11/18/22   Milford, Anderson Malta, FNP  [facility]) 61 MG  CAPS Take 1 capsule by mouth daily. 11/19/22   Bensimhon, Bevelyn Buckles, MD  tiZANidine (ZANAFLEX) 4 MG capsule Take 4 mg by mouth daily as needed. 07/10/23   [provider]  warfarin (COUMADIN) 5 MG tablet TAKE 1 TO 2 TABLETS BY MOUTH DAILY AS DIRECTED BY COUMADIN CLINIC 04/11/23   Runell Gess, MD    Physical Exam    Vital  Signs:  [name] does not have vital signs available for review today.  Given telephonic nature of communication, physical exam is limited. AAOx3. NAD. Normal affect.  Speech and respirations are unlabored.  Accessory Clinical Findings    None  Assessment & Plan    1.  Preoperative Cardiovascular Risk Assessment: -Patient's RCRI score is 6.6%  The patient affirms he has been doing well without any new cardiac symptoms. They are able to achieve 7 METS without cardiac limitations. Therefore, based on ACC/AHA guidelines, the patient would be at acceptable risk for the planned procedure without further cardiovascular testing. The patient was advised that if he develops new symptoms prior to surgery to contact our office to arrange for a follow-up visit, and he verbalized understanding.   The patient was advised that if he develops new symptoms prior to surgery to contact our office to arrange for a follow-up visit, and he verbalized understanding.  Per office protocol, patient can hold warfarin for 5 days prior to procedure.     Patient WILL need bridging with Lovenox (enoxaparin) around procedure.  A copy of this note will be routed to requesting surgeon.  Time:   Today, I have spent 6 minutes with the patient with telehealth technology discussing medical history, symptoms, and management plan.     Napoleon Form, Leodis Rains, NP  08/08/2023, 10:37 AM

## 2023-08-08 NOTE — Progress Notes (Signed)
I called the pt and left detailed message to call back to reschedule his tele appt that he missed yesterday for pre op clearance. Per Zonia Kief, NP to add pt to the end of the day at 4 pm. I will also update the requesting office as well.

## 2023-08-08 NOTE — Telephone Encounter (Signed)
-----   Message from Napoleon Form, Leodis Rains sent at 08/07/2023  4:58 PM EST ----- Patient was a no-show for today's virtual visit.  He was contacted 4 times with voicemails left on both contact numbers.  Please reschedule patient for future visit.  Patient's surgery is scheduled for 08/14/2023.

## 2023-08-11 ENCOUNTER — Other Ambulatory Visit (HOSPITAL_COMMUNITY): Payer: Self-pay

## 2023-08-12 ENCOUNTER — Other Ambulatory Visit: Payer: Self-pay | Admitting: Nurse Practitioner

## 2023-08-12 DIAGNOSIS — R058 Other specified cough: Secondary | ICD-10-CM

## 2023-08-12 NOTE — Anesthesia Preprocedure Evaluation (Signed)
Anesthesia Evaluation    Reviewed: Allergy & Precautions, H&P , Patient's Chart, lab work & pertinent test results, Unable to perform ROS - Chart review only  Airway        Dental   Pulmonary former smoker          Cardiovascular hypertension, + CAD  + dysrhythmias      Neuro/Psych    GI/Hepatic   Endo/Other  diabetes, Type 2, Oral Hypoglycemic Agents    Renal/GU      Musculoskeletal  (+) Arthritis ,    Abdominal   Peds  Hematology   Anesthesia Other Findings   Reproductive/Obstetrics                             Anesthesia Physical Anesthesia Plan  ASA: III  Anesthesia Plan: General   Post-op Pain Management:    Induction: Intravenous  PONV Risk Score and Plan:   Airway Management Planned: Oral ETT  Additional Equipment:   Intra-op Plan:   Post-operative Plan: Extubation in OR  Informed Consent: I have reviewed the patients History and Physical, chart, labs and discussed the procedure including the risks, benefits and alternatives for the proposed anesthesia with the patient or authorized representative who has indicated his/her understanding and acceptance.       Plan Discussed with:   Anesthesia Plan Comments: (Pt presented to short stay in ?sinus tach in low 140's  vs afib with RVR. QRS complex on EKG today is wider than that on EKG 01/23/2025 as well. Will have sent to ED for evaluation.   PAT note written by Shonna Chock, PA-C.  )        Anesthesia Quick Evaluation

## 2023-08-14 ENCOUNTER — Other Ambulatory Visit: Payer: Self-pay

## 2023-08-14 ENCOUNTER — Ambulatory Visit (HOSPITAL_BASED_OUTPATIENT_CLINIC_OR_DEPARTMENT_OTHER): Payer: 59 | Admitting: Anesthesiology

## 2023-08-14 ENCOUNTER — Ambulatory Visit (HOSPITAL_COMMUNITY): Payer: 59

## 2023-08-14 ENCOUNTER — Ambulatory Visit (HOSPITAL_COMMUNITY)
Admission: RE | Admit: 2023-08-14 | Discharge: 2023-08-14 | Disposition: A | Discharge status: Home / Self Care | Payer: 59 | Source: Ambulatory Visit | Attending: Orthopedic Surgery | Admitting: Orthopedic Surgery

## 2023-08-14 ENCOUNTER — Encounter (HOSPITAL_COMMUNITY)
Admission: RE | Disposition: A | Discharge status: Home / Self Care | Payer: Self-pay | Source: Ambulatory Visit | Attending: Orthopedic Surgery

## 2023-08-14 ENCOUNTER — Ambulatory Visit (HOSPITAL_COMMUNITY): Payer: 59 | Admitting: Vascular Surgery

## 2023-08-14 DIAGNOSIS — M4802 Spinal stenosis, cervical region: Secondary | ICD-10-CM | POA: Insufficient documentation

## 2023-08-14 DIAGNOSIS — I5042 Chronic combined systolic (congestive) and diastolic (congestive) heart failure: Secondary | ICD-10-CM | POA: Insufficient documentation

## 2023-08-14 DIAGNOSIS — M5416 Radiculopathy, lumbar region: Secondary | ICD-10-CM

## 2023-08-14 DIAGNOSIS — Z7984 Long term (current) use of oral hypoglycemic drugs: Secondary | ICD-10-CM | POA: Insufficient documentation

## 2023-08-14 DIAGNOSIS — M5412 Radiculopathy, cervical region: Secondary | ICD-10-CM | POA: Diagnosis present

## 2023-08-14 DIAGNOSIS — I110 Hypertensive heart disease with heart failure: Secondary | ICD-10-CM | POA: Insufficient documentation

## 2023-08-14 DIAGNOSIS — I2510 Atherosclerotic heart disease of native coronary artery without angina pectoris: Secondary | ICD-10-CM | POA: Diagnosis not present

## 2023-08-14 DIAGNOSIS — Z95810 Presence of automatic (implantable) cardiac defibrillator: Secondary | ICD-10-CM | POA: Insufficient documentation

## 2023-08-14 DIAGNOSIS — E119 Type 2 diabetes mellitus without complications: Secondary | ICD-10-CM | POA: Diagnosis not present

## 2023-08-14 DIAGNOSIS — I252 Old myocardial infarction: Secondary | ICD-10-CM | POA: Diagnosis not present

## 2023-08-14 DIAGNOSIS — Z86718 Personal history of other venous thrombosis and embolism: Secondary | ICD-10-CM | POA: Diagnosis not present

## 2023-08-14 DIAGNOSIS — Z981 Arthrodesis status: Secondary | ICD-10-CM | POA: Insufficient documentation

## 2023-08-14 DIAGNOSIS — Z87891 Personal history of nicotine dependence: Secondary | ICD-10-CM | POA: Diagnosis not present

## 2023-08-14 DIAGNOSIS — Z7901 Long term (current) use of anticoagulants: Secondary | ICD-10-CM | POA: Insufficient documentation

## 2023-08-14 DIAGNOSIS — Z01818 Encounter for other preprocedural examination: Secondary | ICD-10-CM

## 2023-08-14 HISTORY — PX: ANTERIOR CERVICAL DECOMP/DISCECTOMY FUSION: SHX1161

## 2023-08-14 LAB — PROTIME-INR
INR: 1 (ref 0.8–1.2)
Prothrombin Time: 13 s (ref 11.4–15.2)

## 2023-08-14 LAB — GLUCOSE, CAPILLARY
Glucose-Capillary: 104 mg/dL — ABNORMAL HIGH (ref 70–99)
Glucose-Capillary: 148 mg/dL — ABNORMAL HIGH (ref 70–99)

## 2023-08-14 SURGERY — ANTERIOR CERVICAL DECOMPRESSION/DISCECTOMY FUSION 1 LEVEL
Anesthesia: General

## 2023-08-14 MED ORDER — THROMBIN 20000 UNITS EX SOLR
CUTANEOUS | Status: DC | PRN
  Administered 2023-08-14: 20 mL

## 2023-08-14 MED ORDER — OXYCODONE HCL 5 MG/5ML PO SOLN: 5.0000 mg | Freq: Once | ORAL | Status: DC | PRN

## 2023-08-14 MED ORDER — HYDROMORPHONE HCL 1 MG/ML IJ SOLN
0.2500 mg | INTRAMUSCULAR | Status: DC | PRN
Start: 2023-08-14 — End: 2023-08-14

## 2023-08-14 MED ORDER — POVIDONE-IODINE 7.5 % EX SOLN: Freq: Once | CUTANEOUS | Status: DC

## 2023-08-14 MED ORDER — FENTANYL CITRATE (PF) 250 MCG/5ML IJ SOLN
INTRAMUSCULAR | Status: AC
  Filled 2023-08-14: qty 5

## 2023-08-14 MED ORDER — LIDOCAINE 2% (20 MG/ML) 5 ML SYRINGE
INTRAMUSCULAR | Status: AC
  Filled 2023-08-14: qty 5

## 2023-08-14 MED ORDER — DEXMEDETOMIDINE HCL IN NACL 80 MCG/20ML IV SOLN
INTRAVENOUS | Status: DC | PRN
  Administered 2023-08-14: 12 ug via INTRAVENOUS

## 2023-08-14 MED ORDER — MIDAZOLAM HCL 2 MG/2ML IJ SOLN
INTRAMUSCULAR | Status: DC | PRN
  Administered 2023-08-14: 2 mg via INTRAVENOUS

## 2023-08-14 MED ORDER — DEXAMETHASONE SODIUM PHOSPHATE 10 MG/ML IJ SOLN
INTRAMUSCULAR | Status: AC
  Filled 2023-08-14: qty 1

## 2023-08-14 MED ORDER — DEXAMETHASONE SODIUM PHOSPHATE 10 MG/ML IJ SOLN
INTRAMUSCULAR | Status: DC | PRN
  Administered 2023-08-14: 5 mg via INTRAVENOUS

## 2023-08-14 MED ORDER — PHENYLEPHRINE 80 MCG/ML (10ML) SYRINGE FOR IV PUSH (FOR BLOOD PRESSURE SUPPORT)
PREFILLED_SYRINGE | INTRAVENOUS | Status: DC | PRN
  Administered 2023-08-14: 160 ug via INTRAVENOUS
  Administered 2023-08-14 (×4): 80 ug via INTRAVENOUS

## 2023-08-14 MED ORDER — HYDROCODONE-ACETAMINOPHEN 5-325 MG PO TABS: 1.0000 | ORAL_TABLET | Freq: Four times a day (QID) | ORAL | 0 refills | Status: DC | PRN

## 2023-08-14 MED ORDER — IPRATROPIUM-ALBUTEROL 0.5-2.5 (3) MG/3ML IN SOLN
RESPIRATORY_TRACT | Status: AC
  Filled 2023-08-14: qty 3

## 2023-08-14 MED ORDER — SUGAMMADEX SODIUM 200 MG/2ML IV SOLN
INTRAVENOUS | Status: DC | PRN
  Administered 2023-08-14: 200 mg via INTRAVENOUS

## 2023-08-14 MED ORDER — BUPIVACAINE-EPINEPHRINE 0.25% -1:200000 IJ SOLN
INTRAMUSCULAR | Status: DC | PRN
  Administered 2023-08-14: 6 mL

## 2023-08-14 MED ORDER — PHENYLEPHRINE 80 MCG/ML (10ML) SYRINGE FOR IV PUSH (FOR BLOOD PRESSURE SUPPORT)
PREFILLED_SYRINGE | INTRAVENOUS | Status: AC
  Filled 2023-08-14: qty 10

## 2023-08-14 MED ORDER — LIDOCAINE 2% (20 MG/ML) 5 ML SYRINGE
INTRAMUSCULAR | Status: DC | PRN
  Administered 2023-08-14: 100 mg via INTRAVENOUS

## 2023-08-14 MED ORDER — ONDANSETRON HCL 4 MG/2ML IJ SOLN
INTRAMUSCULAR | Status: DC | PRN
  Administered 2023-08-14: 4 mg via INTRAVENOUS

## 2023-08-14 MED ORDER — THROMBIN 20000 UNITS EX SOLR
CUTANEOUS | Status: AC
  Filled 2023-08-14: qty 20000

## 2023-08-14 MED ORDER — MIDAZOLAM HCL 2 MG/2ML IJ SOLN
INTRAMUSCULAR | Status: AC
  Filled 2023-08-14: qty 2

## 2023-08-14 MED ORDER — ALBUTEROL SULFATE HFA 108 (90 BASE) MCG/ACT IN AERS
INHALATION_SPRAY | RESPIRATORY_TRACT | Status: DC | PRN
  Administered 2023-08-14: 2 via RESPIRATORY_TRACT

## 2023-08-14 MED ORDER — PROPOFOL 10 MG/ML IV BOLUS
INTRAVENOUS | Status: DC | PRN
  Administered 2023-08-14: 150 mg via INTRAVENOUS

## 2023-08-14 MED ORDER — CHLORHEXIDINE GLUCONATE 0.12 % MT SOLN
OROMUCOSAL | Status: AC
  Administered 2023-08-14: 15 mL
  Filled 2023-08-14: qty 15

## 2023-08-14 MED ORDER — ROCURONIUM BROMIDE 10 MG/ML (PF) SYRINGE
PREFILLED_SYRINGE | INTRAVENOUS | Status: AC
  Filled 2023-08-14: qty 10

## 2023-08-14 MED ORDER — LACTATED RINGERS IV SOLN: INTRAVENOUS | Status: DC | PRN

## 2023-08-14 MED ORDER — PROPOFOL 10 MG/ML IV BOLUS
INTRAVENOUS | Status: AC
  Filled 2023-08-14: qty 20

## 2023-08-14 MED ORDER — METHOCARBAMOL 500 MG PO TABS
500.0000 mg | ORAL_TABLET | Freq: Three times a day (TID) | ORAL | 0 refills | Status: DC | PRN
Start: 2023-08-14 — End: 2024-04-26

## 2023-08-14 MED ORDER — BUPIVACAINE-EPINEPHRINE (PF) 0.25% -1:200000 IJ SOLN
INTRAMUSCULAR | Status: AC
  Filled 2023-08-14: qty 30

## 2023-08-14 MED ORDER — ACETAMINOPHEN 500 MG PO TABS
1000.0000 mg | ORAL_TABLET | Freq: Once | ORAL | Status: AC
  Administered 2023-08-14: 1000 mg via ORAL
  Filled 2023-08-14: qty 2

## 2023-08-14 MED ORDER — FENTANYL CITRATE (PF) 250 MCG/5ML IJ SOLN
INTRAMUSCULAR | Status: DC | PRN
  Administered 2023-08-14: 100 ug via INTRAVENOUS
  Administered 2023-08-14: 50 ug via INTRAVENOUS

## 2023-08-14 MED ORDER — CEFAZOLIN SODIUM-DEXTROSE 2-4 GM/100ML-% IV SOLN
INTRAVENOUS | Status: AC
  Filled 2023-08-14: qty 100

## 2023-08-14 MED ORDER — OXYCODONE HCL 5 MG PO TABS: 5.0000 mg | ORAL_TABLET | Freq: Once | ORAL | Status: DC | PRN

## 2023-08-14 MED ORDER — AMISULPRIDE (ANTIEMETIC) 5 MG/2ML IV SOLN: 10.0000 mg | Freq: Once | INTRAVENOUS | Status: DC | PRN

## 2023-08-14 MED ORDER — ROCURONIUM BROMIDE 10 MG/ML (PF) SYRINGE
PREFILLED_SYRINGE | INTRAVENOUS | Status: DC | PRN
  Administered 2023-08-14 (×2): 20 mg via INTRAVENOUS
  Administered 2023-08-14: 60 mg via INTRAVENOUS

## 2023-08-14 MED ORDER — 0.9 % SODIUM CHLORIDE (POUR BTL) OPTIME
TOPICAL | Status: DC | PRN
  Administered 2023-08-14: 1000 mL

## 2023-08-14 MED ORDER — IPRATROPIUM-ALBUTEROL 0.5-2.5 (3) MG/3ML IN SOLN
3.0000 mL | Freq: Once | RESPIRATORY_TRACT | Status: AC
  Administered 2023-08-14: 3 mL via RESPIRATORY_TRACT

## 2023-08-14 MED ORDER — CEFAZOLIN SODIUM-DEXTROSE 2-4 GM/100ML-% IV SOLN
2.0000 g | INTRAVENOUS | Status: AC
  Administered 2023-08-14: 2 g via INTRAVENOUS
  Filled 2023-08-14: qty 100

## 2023-08-14 SURGICAL SUPPLY — 69 items
BAG COUNTER SPONGE SURGICOUNT (BAG) ×1 IMPLANT
BENZOIN TINCTURE PRP APPL 2/3 (GAUZE/BANDAGES/DRESSINGS) ×1 IMPLANT
BIT DRILL NEURO 2X3.1 SFT TUCH (MISCELLANEOUS) ×1 IMPLANT
BIT DRILL SKYLINE 12MM (BIT) IMPLANT
BLADE CLIPPER SURG (BLADE) ×1 IMPLANT
BLADE SURG 15 STRL LF DISP TIS (BLADE) ×1 IMPLANT
BLADE SURG 15 STRL SS (BLADE) ×1
CORD BIPOLAR FORCEPS 12FT (ELECTRODE) ×1 IMPLANT
COVER SURGICAL LIGHT HANDLE (MISCELLANEOUS) ×1 IMPLANT
DEVICE ENDSKLTN IMPLANT SM 7MM (Cage) IMPLANT
DRAIN JACKSON RD 7FR 3/32 (WOUND CARE) IMPLANT
DRAPE C-ARM 42X72 X-RAY (DRAPES) ×1 IMPLANT
DRAPE POUCH INSTRU U-SHP 10X18 (DRAPES) ×1 IMPLANT
DRAPE SURG 17X23 STRL (DRAPES) ×3 IMPLANT
DRILL BIT SKYLINE 12MM (BIT) ×1
DRILL NEURO 2X3.1 SOFT TOUCH (MISCELLANEOUS) ×1
DURAPREP 26ML APPLICATOR (WOUND CARE) ×1 IMPLANT
ELECT COATED BLADE 2.86 ST (ELECTRODE) ×1 IMPLANT
ELECT REM PT RETURN 9FT ADLT (ELECTROSURGICAL) ×1
ELECTRODE REM PT RTRN 9FT ADLT (ELECTROSURGICAL) ×1 IMPLANT
ENDOSKELETON IMPLANT SM 7MM (Cage) ×1 IMPLANT
EVACUATOR SILICONE 100CC (DRAIN) IMPLANT
GAUZE 4X4 16PLY ~~LOC~~+RFID DBL (SPONGE) ×1 IMPLANT
GAUZE SPONGE 4X4 12PLY STRL (GAUZE/BANDAGES/DRESSINGS) ×1 IMPLANT
GLOVE BIO SURGEON STRL SZ 6.5 (GLOVE) ×1 IMPLANT
GLOVE BIO SURGEON STRL SZ8 (GLOVE) ×1 IMPLANT
GLOVE BIOGEL PI IND STRL 7.0 (GLOVE) ×2 IMPLANT
GLOVE BIOGEL PI IND STRL 8 (GLOVE) ×1 IMPLANT
GLOVE SURG ENC MOIS LTX SZ6.5 (GLOVE) ×1 IMPLANT
GOWN STRL REUS W/ TWL LRG LVL3 (GOWN DISPOSABLE) ×1 IMPLANT
GOWN STRL REUS W/ TWL XL LVL3 (GOWN DISPOSABLE) ×1 IMPLANT
GOWN STRL REUS W/TWL LRG LVL3 (GOWN DISPOSABLE) ×1
GOWN STRL REUS W/TWL XL LVL3 (GOWN DISPOSABLE) ×1
IV CATH 14GX2 1/4 (CATHETERS) ×1 IMPLANT
KIT BASIN OR (CUSTOM PROCEDURE TRAY) ×1 IMPLANT
KIT TURNOVER KIT B (KITS) ×1 IMPLANT
MANIFOLD NEPTUNE II (INSTRUMENTS) ×1 IMPLANT
NDL PRECISIONGLIDE 27X1.5 (NEEDLE) ×1 IMPLANT
NDL SPNL 20GX3.5 QUINCKE YW (NEEDLE) ×1 IMPLANT
NEEDLE PRECISIONGLIDE 27X1.5 (NEEDLE) ×1
NEEDLE SPNL 20GX3.5 QUINCKE YW (NEEDLE) ×1
NS IRRIG 1000ML POUR BTL (IV SOLUTION) ×1 IMPLANT
PACK ORTHO CERVICAL (CUSTOM PROCEDURE TRAY) ×1 IMPLANT
PAD ARMBOARD 7.5X6 YLW CONV (MISCELLANEOUS) ×2 IMPLANT
PATTIES SURGICAL .5 X.5 (GAUZE/BANDAGES/DRESSINGS) IMPLANT
PATTIES SURGICAL .5 X1 (DISPOSABLE) IMPLANT
PIN DISTRACTION 12MM (PIN) ×2
PIN DSTRCT 12XNS SS ACIS (PIN) IMPLANT
PLATE ONE LEVEL SKYLINE 14MM (Plate) IMPLANT
POSITIONER HEAD DONUT 9IN (MISCELLANEOUS) ×1 IMPLANT
PUTTY BONE DBX 2.5 MIS (Bone Implant) IMPLANT
SCREW SKYLINE VARIABLE LG (Screw) IMPLANT
SPONGE INTESTINAL PEANUT (DISPOSABLE) ×1 IMPLANT
SPONGE SURGIFOAM ABS GEL 100 (HEMOSTASIS) IMPLANT
STRIP CLOSURE SKIN 1/2X4 (GAUZE/BANDAGES/DRESSINGS) ×1 IMPLANT
SURGIFLO W/THROMBIN 8M KIT (HEMOSTASIS) IMPLANT
SUT MNCRL AB 4-0 PS2 18 (SUTURE) ×1 IMPLANT
SUT SILK 4 0 (SUTURE)
SUT SILK 4-0 18XBRD TIE 12 (SUTURE) IMPLANT
SUT VIC AB 2-0 CT2 18 VCP726D (SUTURE) ×1 IMPLANT
SYR BULB IRRIG 60ML STRL (SYRINGE) ×1 IMPLANT
SYR CONTROL 10ML LL (SYRINGE) ×2 IMPLANT
TAPE CLOTH 4X10 WHT NS (GAUZE/BANDAGES/DRESSINGS) ×1 IMPLANT
TAPE CLOTH SURG 4X10 WHT LF (GAUZE/BANDAGES/DRESSINGS) IMPLANT
TAPE UMBILICAL 1/8X30 (MISCELLANEOUS) ×2 IMPLANT
TOWEL GREEN STERILE (TOWEL DISPOSABLE) ×1 IMPLANT
TOWEL GREEN STERILE FF (TOWEL DISPOSABLE) ×1 IMPLANT
WATER STERILE IRR 1000ML POUR (IV SOLUTION) ×1 IMPLANT
YANKAUER SUCT BULB TIP NO VENT (SUCTIONS) ×1 IMPLANT

## 2023-08-14 NOTE — Anesthesia Procedure Notes (Signed)
Procedure Name: Intubation Date/Time: 08/14/2023 8:00 AM  Performed by: Thomasene Ripple, CRNAPre-anesthesia Checklist: Patient identified, Emergency Drugs available, Suction available and Patient being monitored Patient Re-evaluated:Patient Re-evaluated prior to induction Oxygen Delivery Method: Circle System Utilized Preoxygenation: Pre-oxygenation with 100% oxygen Induction Type: IV induction Ventilation: Mask ventilation without difficulty Laryngoscope Size: Miller and 3 Grade View: Grade I Tube type: Oral Tube size: 8.0 mm Number of attempts: 1 Airway Equipment and Method: Stylet and Oral airway Placement Confirmation: ETT inserted through vocal cords under direct vision, positive ETCO2 and breath sounds checked- equal and bilateral Secured at: 23 cm Tube secured with: Tape Dental Injury: Teeth and Oropharynx as per pre-operative assessment

## 2023-08-14 NOTE — Anesthesia Postprocedure Evaluation (Signed)
Anesthesia Post Note  Patient: Jonathan Dickson  Procedure(s) Performed: ANTERIOR CERVICAL DECOMPRESSION FUSION CERVICAL 7 - THORACIC 1 WITH INSTRUMENTATION AND ALLOGRAFT     Patient location during evaluation: PACU Anesthesia Type: General Level of consciousness: awake Pain management: pain level controlled Vital Signs Assessment: post-procedure vital signs reviewed and stable Respiratory status: spontaneous breathing, nonlabored ventilation and respiratory function stable Cardiovascular status: blood pressure returned to baseline and stable Postop Assessment: no apparent nausea or vomiting Anesthetic complications: no Comments: ICD turned back on in PACU.   No notable events documented.  Last Vitals:  Vitals:   08/14/23 1130 08/14/23 1145  BP: 107/80 116/84  Pulse: 96 99  Resp: 20 20  Temp:  36.4 C  SpO2: 92% 93%    Last Pain:  Vitals:   08/14/23 1100  TempSrc:   PainSc: 0-No pain                 Linton Rump

## 2023-08-14 NOTE — H&P (Signed)
PREOPERATIVE H&P  Chief Complaint: Right arm pain  HPI: Jonathan Dickson is a 67 y.o. male who presents with ongoing pain in the right arm  MRI reveals severe stenosis at C7/T1, the level below the patient's previous fusion  Patient has failed multiple forms of conservative care and continues to have pain (see office notes for additional details regarding the patient's full course of treatment)  Past Medical History:  Diagnosis Date   Abnormal TSH 02/2016   AICD (automatic cardioverter/defibrillator) present    St Jude   Anxiety    Blood clotting disorder (HCC)    Bronchiectasis (HCC)    left lower lung   Cardiac amyloidosis (HCC)    Cervical disc disease    Chronic combined systolic and diastolic CHF (congestive heart failure) (HCC)    a. 01/2016 Echo: EF 25%, inf AK, diffuse sev HK, Gr1 DD, mild MR.   Coronary atherosclerosis    Depression    Diabetes mellitus without complication (HCC)    type 2   Dyspnea    with exertion   Dysrhythmia    Full thickness rotator cuff tear 06/2021   MRI   History of DVT (deep vein thrombosis) 08/04/2019   venous doppler US   History of hiatal hernia    Hx of adenomatous polyp of colon    Hyperglycemia    Hyperlipidemia    Hypertension    Hypertensive heart disease    Myocardial infarction (HCC)    greater than 5 yrs per patient on 08/06/23   NICM (nonischemic cardiomyopathy) (HCC)    a. 01/2016 Echo: EF 25%, inf AK, diffuse sev HK, Gr1 DD, mild MR; b. 01/2016 MV: EF 22%, no isch/infarct;  c. 02/2016 Cath: Nl cors, EF 35-45%.   Osteoarthritis of hips, bilateral    and hands   Paraseptal emphysema (HCC)    mild   Peanut allergy    Pneumonia    x 1   Pre-diabetes    hx but now DM 2   Presence of implantable cardioverter-defibrillator (ICD)    St Jude   S/P repair of ventral hernia    Seizures (HCC)    allergic with penicillin - only one seizure   Sensorineural hearing loss 11/25/2017   no hearing aids, patient denies this dx    Substance abuse (HCC)    none in 20 yrs per patient as of 08/06/23   Past Surgical History:  Procedure Laterality Date   ANTERIOR CERVICAL DECOMP/DISCECTOMY FUSION N/A 07/10/2016   Procedure: ANTERIOR CERVICAL DECOMPRESSION FUSION CERVICAL 4-5, CERVICAL 5-6, CERVICAL 6-7 WITH INSTRUMENTATION AND ALLOGRAFT;  Surgeon: Estill Bamberg, MD;  Location: [facility] OR;  Service: Orthopedics;  Laterality: N/A;   ANTERIOR CERVICAL DECOMP/DISCECTOMY FUSION N/A 04/19/2021   Procedure: ANTERIOR CERVICAL DECOMPRESSION FUSION CERVICAL 3- CERVICAL 4 WITH INSTRUMENTATION AND ALLOGRAFT;  Surgeon: Estill Bamberg, MD;  Location: [facility] OR;  Service: Orthopedics;  Laterality: N/A;   BIOPSY  07/28/2023   Procedure: BIOPSY;  Surgeon: Benancio Deeds, MD;  Location: [facility];  Service: Gastroenterology;;   CARDIAC CATHETERIZATION N/A 03/11/2016   Procedure: Left Heart Cath and Coronary Angiography;  Surgeon: Marykay Lex, MD;  Location: [facility] INVASIVE CV LAB;  Service: Cardiovascular;  Laterality: N/A;   COLONOSCOPY     COLONOSCOPY WITH PROPOFOL N/A 07/28/2023   Procedure: COLONOSCOPY WITH PROPOFOL;  Surgeon: Benancio Deeds, MD;  Location: [facility];  Service: Gastroenterology;  Laterality: N/A;   ESOPHAGOGASTRODUODENOSCOPY (EGD) WITH PROPOFOL N/A 07/28/2023   Procedure:  ESOPHAGOGASTRODUODENOSCOPY (EGD) WITH PROPOFOL;  Surgeon: Benancio Deeds, MD;  Location: [facility];  Service: Gastroenterology;  Laterality: N/A;   HOT HEMOSTASIS N/A 07/28/2023   Procedure: HOT HEMOSTASIS (ARGON PLASMA COAGULATION/BICAP);  Surgeon: Benancio Deeds, MD;  Location: [facility];  Service: Gastroenterology;  Laterality: N/A;   ICD IMPLANT N/A 06/17/2018   Procedure: ICD IMPLANT;  Surgeon: Regan Lemming, MD;  Location: [facility] INVASIVE CV LAB;  Service: Cardiovascular;  Laterality: N/A;   POLYPECTOMY  07/28/2023   Procedure: POLYPECTOMY;  Surgeon: Benancio Deeds, MD;  Location: [facility];  Service:  Gastroenterology;;   Thumb surgery Right    VENTRAL HERNIA REPAIR N/A 10/25/2019   Procedure: PRIMARY VENTRAL HERNIA REPAIR;  Surgeon: Berna Bue, MD;  Location: [facility] ORS;  Service: General;  Laterality: N/A;   Social History   Socioeconomic History   Marital status: Legally Separated    Spouse name: Not on file   Number of children: 3   Years of education: Not on file   Highest education level: 10th grade  Occupational History   Not on file  Tobacco Use   Smoking status: Former    Current packs/day: 0.00    Average packs/day: 1.5 packs/day for 18.0 years (27.0 ttl pk-yrs)    Types: Cigarettes    Start date: 03/14/1985    Quit date: 03/15/2003    Years since quitting: 20.4   Smokeless tobacco: Never  Vaping Use   Vaping status: Never Used  Substance and Sexual Activity   Alcohol use: No    Comment: former, none since 2004   Drug use: Yes    Types: Heroin, Marijuana, "Crack" cocaine    Comment: former, none since 2004   Sexual activity: Not Currently  Other Topics Concern   Not on file  Social History Narrative   Not on file   Social Determinants of Health   Financial Resource Strain: Low Risk  (05/06/2023)   Overall Financial Resource Strain (CARDIA)    Difficulty of Paying Living Expenses: Not hard at all  Food Insecurity: No Food Insecurity (05/06/2023)   Hunger Vital Sign    Worried About Running Out of Food in the Last Year: Never true    Ran Out of Food in the Last Year: Never true  Transportation Needs: No Transportation Needs (05/06/2023)   PRAPARE - Administrator, Civil Service (Medical): No    Lack of Transportation (Non-Medical): No  Physical Activity: Insufficiently Active (05/06/2023)   Exercise Vital Sign    Days of Exercise per Week: 3 days    Minutes of Exercise per Session: 30 min  Stress: No Stress Concern Present (05/06/2023)   [employer] of Occupational Health - Occupational Stress Questionnaire    Feeling of Stress : Not at all   Social Connections: Moderately Isolated (05/06/2023)   Social Connection and Isolation Panel [NHANES]    Frequency of Communication with Friends and Family: More than three times a week    Frequency of Social Gatherings with Friends and Family: Once a week    Attends Religious Services: 1 to 4 times per year    Active Member of [employer] or Organizations: No    Attends Banker Meetings: Never    Marital Status: Widowed   Family History  Problem Relation Age of Onset   Diabetes Mother    Diabetes Sister    Clotting disorder Neg Hx    Lung disease Neg Hx    Colon cancer Neg Hx  Esophageal cancer Neg Hx    Stomach cancer Neg Hx    Pancreatic cancer Neg Hx    Colon polyps Neg Hx    Rectal cancer Neg Hx    Allergies  Allergen Reactions   Peanut-Containing Drug Products Swelling   Penicillins Other (See Comments)    CONVULSIONS    Decadron [Dexamethasone] Itching   Ivp Dye [Iodinated Contrast Media]    Prior to Admission medications   Medication Sig Start Date End Date Taking? Authorizing Provider  acetaminophen (TYLENOL) 500 MG tablet Take 1,000 mg by mouth every 6 (six) hours as needed for moderate pain or headache.   Yes [provider]  albuterol (VENTOLIN HFA) 108 (90 Base) MCG/ACT inhaler INHALE 2 PUFFS INTO THE LUNGS EVERY 6 HOURS AS NEEDED FOR WHEEZING OR SHORTNESS OF BREATH 05/02/23  Yes Parrett, Tammy S, NP  Aromatic Inhalants (VICKS VAPOINHALER) INHA Inhale 1 Dose into the lungs daily as needed (congestion).   Yes [provider]  atorvastatin (LIPITOR) 80 MG tablet Take 1 tablet (80 mg total) by mouth daily. 02/05/23  Yes Claiborne Rigg, NP  bismuth subsalicylate (PEPTO-BISMOL) 262 MG chewable tablet Chew 2 tablets (524 mg total) by mouth 4 (four) times daily for 14 days. 08/01/23 08/15/23 Yes Armbruster, Willaim Rayas, MD  carvedilol (COREG) 12.5 MG tablet Take 1 tablet (12.5 mg total) by mouth 2 (two) times daily with a meal. 11/18/22  Yes Milford,  Anderson Malta, FNP  Chlorphen-Phenyleph-ASA (ALKA-SELTZER PLUS COLD PO) Take 1-2 tablets by mouth 2 (two) times daily as needed (cold symptoms).    Yes [provider]  cholecalciferol (VITAMIN D3) 25 MCG (1000 UNIT) tablet Take 1,000 Units by mouth daily. 11/25/17  Yes [provider]  dapagliflozin propanediol (FARXIGA) 10 MG TABS tablet Take 1 tablet (10 mg total) by mouth daily. 12/03/22  Yes Bensimhon, Bevelyn Buckles, MD  diclofenac Sodium (VOLTAREN) 1 % GEL Apply 2 g topically 4 (four) times daily as needed (pain).   Yes [provider]  dicyclomine (BENTYL) 20 MG tablet Take 1 tablet (20 mg total) by mouth 2 (two) times daily. 07/31/23  Yes Simeon Craft, PA-C  doxycycline (VIBRA-TABS) 100 MG tablet Take 1 tablet (100 mg total) by mouth 2 (two) times daily for 14 days. 08/01/23 08/15/23 Yes Armbruster, Willaim Rayas, MD  enoxaparin (LOVENOX) 80 MG/0.8ML injection Inject 0.8 mLs (80 mg total) into the skin every 12 (twelve) hours. 08/04/23  Yes Bensimhon, Bevelyn Buckles, MD  gabapentin (NEURONTIN) 300 MG capsule Take 300 mg by mouth daily as needed. 07/07/23  Yes [provider]  loratadine (CLARITIN) 10 MG tablet Take 1 tablet (10 mg total) by mouth daily. Patient taking differently: Take 10 mg by mouth daily as needed for allergies. 02/05/23  Yes Claiborne Rigg, NP  metFORMIN (GLUCOPHAGE) 500 MG tablet TAKE 1 TABLET(500 MG) BY MOUTH DAILY WITH BREAKFAST 08/01/23  Yes Claiborne Rigg, NP  methocarbamol (ROBAXIN) 500 MG tablet Take 1 tablet (500 mg total) by mouth every 8 (eight) hours as needed for muscle spasms. 05/05/23  Yes Sabas Sous, MD  metroNIDAZOLE (FLAGYL) 250 MG tablet Take 1 tablet (250 mg total) by mouth 4 (four) times daily for 14 days. 08/01/23 08/15/23 Yes Armbruster, Willaim Rayas, MD  omeprazole (PRILOSEC OTC) 20 MG tablet Take 1 tablet (20 mg total) by mouth daily. Take 1 tablet daily for the next 2 weeks and then as needed for reflux 07/28/23 07/27/24 Yes Armbruster,  Willaim Rayas, MD  ondansetron (  ZOFRAN) 4 MG tablet Take 1 tablet (4 mg total) by mouth every 6 (six) hours. 07/31/23  Yes Simeon Craft, PA-C  spironolactone (ALDACTONE) 25 MG tablet Take 0.5 tablets (12.5 mg total) by mouth daily. TAKE 1/2 TABLET(12.5 MG) BY MOUTH DAILY 11/18/22  Yes Milford, Jessica M, FNP  Tafamidis (VYNDAMAX) 61 MG CAPS Take 1 capsule by mouth daily. 11/19/22  Yes Bensimhon, Bevelyn Buckles, MD  tiZANidine (ZANAFLEX) 4 MG capsule Take 4 mg by mouth daily as needed. 07/10/23  Yes [provider]  warfarin (COUMADIN) 5 MG tablet TAKE 1 TO 2 TABLETS BY MOUTH DAILY AS DIRECTED BY COUMADIN CLINIC 04/11/23  Yes Runell Gess, MD  fluticasone [facility]) 50 MCG/ACT nasal spray SHAKE LIQUID AND USE 1 SPRAY IN [facility] NOSTRIL DAILY 08/12/23   Charlott Holler, MD  Misc. Devices MISC by Does not apply route. Flutter valve daily    [provider]  sacubitril-valsartan (ENTRESTO) 49-51 MG Take 1 tablet by mouth 2 (two) times daily. NEEDS FOLLOW UP APPOINTMENT FOR MORE REFILLS 08/06/23   Jacklynn Ganong, FNP     All other systems have been reviewed and were otherwise negative with the exception of those mentioned in the HPI and as above.  Physical Exam: Vitals:   08/14/23 0617  BP: 131/87  Pulse: 80  Resp: 18  Temp: 97.7 F (36.5 C)  SpO2: 96%    Body mass index is 26.63 kg/m.  General: Alert, no acute distress Cardiovascular: No pedal edema Respiratory: No cyanosis, no use of accessory musculature Skin: No lesions in the area of chief complaint Neurologic: Sensation intact distally Psychiatric: Patient is competent for consent with normal mood and affect Lymphatic: No axillary or cervical lymphadenopathy   Assessment/Plan: Right-sided cervical radiculopathy Plan for Procedure(s): ANTERIOR CERVICAL DECOMPRESSION FUSION CERVICAL 7 - THORACIC 1 WITH INSTRUMENTATION AND ALLOGRAFT   Jackelyn Hoehn, MD 08/14/2023 6:43 AM

## 2023-08-14 NOTE — Transfer of Care (Signed)
Immediate Anesthesia Transfer of Care Note  Patient: Jonathan Dickson  Procedure(s) Performed: ANTERIOR CERVICAL DECOMPRESSION FUSION CERVICAL 7 - THORACIC 1 WITH INSTRUMENTATION AND ALLOGRAFT  Patient Location: PACU  Anesthesia Type:General  Level of Consciousness: awake and drowsy  Airway & Oxygen Therapy: Patient Spontanous Breathing and Patient connected to face mask oxygen  Post-op Assessment: Report given to RN and Post -op Vital signs reviewed and stable  Post vital signs: Reviewed and stable  Last Vitals:  Vitals Value Taken Time  BP 109/75 08/14/23 1032  Temp    Pulse 94 08/14/23 1039  Resp 20 08/14/23 1039  SpO2 99 % 08/14/23 1039  Vitals shown include unfiled device data.  Last Pain:  Vitals:   08/14/23 0617  TempSrc: Oral         Complications: No notable events documented.

## 2023-08-14 NOTE — Op Note (Addendum)
PATIENT NAME: Jonathan Dickson   MEDICAL RECORD NO.:   [id number]    DATE OF BIRTH: 1956-02-23   DATE OF PROCEDURE: 08/14/2023                               OPERATIVE REPORT     PREOPERATIVE DIAGNOSES: 1. Right-sided cervical radiculopathy 2. Spinal stenosis, C6/7 3. Status post previous ACDF spanning C3-C7, with instrumentation   POSTOPERATIVE DIAGNOSES: 1. Right-sided cervical radiculopathy 2. Spinal stenosis, C6/7 3. Status post previous ACDF spanning C3-C7, with instrumentation   PROCEDURE: 1. Anterior cervical decompression and fusion C7/T1 2. Placement of anterior instrumentation, C7/T1 3. Insertion of interbody device x1 (7mm Titan intervertebral spacer). 4. Intraoperative use of fluoroscopy. 5. Use of morselized allograft - DBX mix   SURGEON:  Estill Bamberg, MD   ASSISTANT:  Jason Coop, PA-C.   ANESTHESIA:  General endotracheal anesthesia.   COMPLICATIONS:  None.   DISPOSITION:  Stable.   ESTIMATED BLOOD LOSS:  Minimal.   INDICATIONS FOR SURGERY:  Briefly, Mr. Casa is a pleasant 67 -year- old male, who did present to me with severe pain in his neck and right arm.   The patient's MRI did reveal the findings noted above.  Given the ongoing rather debilitating pain and lack of improvement with appropriate treatment measures, we did discuss proceeding with the procedure noted above.  The patient was fully aware of the risks and limitations of surgery as outlined in my preoperative note.   OPERATIVE DETAILS:  On 08/14/2023, the patient was brought to surgery and general endotracheal anesthesia was administered.  The patient was placed supine on the hospital bed. The neck was gently extended.  All bony prominences were meticulously padded.  The neck was prepped and draped in the usual sterile fashion.  At this point, I did make a left-sided transverse incision in line with the C7-T1 intervertebral space.  The platysma was incised.  A Smith-Robinson approach was  used and the anterior spine was identified. The previously placed hardware spanning C5-C7 was noted.  The screws associated with the C5, C6, and C7 vertebral bodies were removed, as well as the anterior plate.  At this point, I turned my attention to the C7-T1 level. A self-retaining retractor was placed.  I then subperiosteally exposed the vertebral bodies from C7-T1.  Caspar pins were then placed into the C7 and T1 vertebral bodies and distraction was applied.  A thorough and complete C7-T1 intervertebral diskectomy was performed.  The posterior longitudinal ligament was identified and entered using a nerve hook.  I then used #1 followed by #2 Kerrison to perform a thorough and complete intervertebral diskectomy.  The spinal canal was thoroughly decompressed, as was the right and left neuroforamen.  The endplates were then prepared and the appropriate-sized intervertebral spacer was then packed with DBX mix and tamped into position in the usual fashion. The Caspar pins  then were removed and bone wax was placed in their place.  The appropriate-sized anterior cervical plate was placed over the anterior spine.  12 mm variable angle screws were placed, 2 in each vertebral body from C7-T1 for a total of 4 vertebral body screws.  The screws were then locked to the plate using the Cam locking mechanism.  I was very pleased with the final fluoroscopic images.  The wound was then irrigated.  The wound was then explored for any undue bleeding and there was no bleeding noted. The  wound was then closed in layers using 2-0 Vicryl, followed by 4-0 Monocryl.  Benzoin and Steri-Strips were applied, followed by sterile dressing.  All instrument counts were correct at the termination of the procedure.   Of note, Jason Coop, PA-C, was my assistant throughout surgery, and did aid in retraction, placement of the hardware, suctioning, and closure from start to finish.     Estill Bamberg, MD

## 2023-08-15 ENCOUNTER — Other Ambulatory Visit (HOSPITAL_COMMUNITY): Payer: Self-pay | Admitting: Family Medicine

## 2023-08-15 DIAGNOSIS — Z86711 Personal history of pulmonary embolism: Secondary | ICD-10-CM

## 2023-08-15 DIAGNOSIS — I428 Other cardiomyopathies: Secondary | ICD-10-CM

## 2023-08-15 DIAGNOSIS — I10 Essential (primary) hypertension: Secondary | ICD-10-CM

## 2023-08-17 ENCOUNTER — Inpatient Hospital Stay (HOSPITAL_COMMUNITY)
Admission: EM | Admit: 2023-08-17 | Discharge: 2023-08-19 | DRG: 194 | Disposition: A | Discharge status: Home / Self Care | Payer: 59 | Attending: Internal Medicine | Admitting: Internal Medicine

## 2023-08-17 ENCOUNTER — Emergency Department (HOSPITAL_COMMUNITY): Payer: 59

## 2023-08-17 ENCOUNTER — Other Ambulatory Visit: Payer: Self-pay

## 2023-08-17 ENCOUNTER — Encounter (HOSPITAL_COMMUNITY): Payer: Self-pay

## 2023-08-17 DIAGNOSIS — M160 Bilateral primary osteoarthritis of hip: Secondary | ICD-10-CM | POA: Diagnosis present

## 2023-08-17 DIAGNOSIS — Z86718 Personal history of other venous thrombosis and embolism: Secondary | ICD-10-CM

## 2023-08-17 DIAGNOSIS — I110 Hypertensive heart disease with heart failure: Secondary | ICD-10-CM | POA: Diagnosis present

## 2023-08-17 DIAGNOSIS — J189 Pneumonia, unspecified organism: Principal | ICD-10-CM | POA: Diagnosis present

## 2023-08-17 DIAGNOSIS — E854 Organ-limited amyloidosis: Secondary | ICD-10-CM | POA: Diagnosis present

## 2023-08-17 DIAGNOSIS — I5022 Chronic systolic (congestive) heart failure: Secondary | ICD-10-CM | POA: Diagnosis present

## 2023-08-17 DIAGNOSIS — Z7901 Long term (current) use of anticoagulants: Secondary | ICD-10-CM

## 2023-08-17 DIAGNOSIS — Z981 Arthrodesis status: Secondary | ICD-10-CM

## 2023-08-17 DIAGNOSIS — I2510 Atherosclerotic heart disease of native coronary artery without angina pectoris: Secondary | ICD-10-CM | POA: Diagnosis present

## 2023-08-17 DIAGNOSIS — I252 Old myocardial infarction: Secondary | ICD-10-CM

## 2023-08-17 DIAGNOSIS — J181 Lobar pneumonia, unspecified organism: Principal | ICD-10-CM | POA: Diagnosis present

## 2023-08-17 DIAGNOSIS — Z7984 Long term (current) use of oral hypoglycemic drugs: Secondary | ICD-10-CM

## 2023-08-17 DIAGNOSIS — Z91010 Allergy to peanuts: Secondary | ICD-10-CM

## 2023-08-17 DIAGNOSIS — M5000 Cervical disc disorder with myelopathy, unspecified cervical region: Secondary | ICD-10-CM | POA: Diagnosis present

## 2023-08-17 DIAGNOSIS — M546 Pain in thoracic spine: Secondary | ICD-10-CM

## 2023-08-17 DIAGNOSIS — Z86711 Personal history of pulmonary embolism: Secondary | ICD-10-CM

## 2023-08-17 DIAGNOSIS — Z91041 Radiographic dye allergy status: Secondary | ICD-10-CM

## 2023-08-17 DIAGNOSIS — Z888 Allergy status to other drugs, medicaments and biological substances status: Secondary | ICD-10-CM

## 2023-08-17 DIAGNOSIS — Z79899 Other long term (current) drug therapy: Secondary | ICD-10-CM

## 2023-08-17 DIAGNOSIS — I428 Other cardiomyopathies: Secondary | ICD-10-CM | POA: Diagnosis present

## 2023-08-17 DIAGNOSIS — E785 Hyperlipidemia, unspecified: Secondary | ICD-10-CM | POA: Diagnosis present

## 2023-08-17 DIAGNOSIS — Z880 Allergy status to penicillin: Secondary | ICD-10-CM

## 2023-08-17 DIAGNOSIS — Z95810 Presence of automatic (implantable) cardiac defibrillator: Secondary | ICD-10-CM

## 2023-08-17 DIAGNOSIS — Z87891 Personal history of nicotine dependence: Secondary | ICD-10-CM

## 2023-08-17 DIAGNOSIS — E119 Type 2 diabetes mellitus without complications: Secondary | ICD-10-CM | POA: Diagnosis present

## 2023-08-17 DIAGNOSIS — I43 Cardiomyopathy in diseases classified elsewhere: Secondary | ICD-10-CM | POA: Diagnosis present

## 2023-08-17 DIAGNOSIS — Z833 Family history of diabetes mellitus: Secondary | ICD-10-CM

## 2023-08-17 LAB — CBC
HCT: 42.5 % (ref 39.0–52.0)
Hemoglobin: 13.1 g/dL (ref 13.0–17.0)
MCH: 24.4 pg — ABNORMAL LOW (ref 26.0–34.0)
MCHC: 30.8 g/dL (ref 30.0–36.0)
MCV: 79.3 fL — ABNORMAL LOW (ref 80.0–100.0)
Platelets: 255 10*3/uL (ref 150–400)
RBC: 5.36 MIL/uL (ref 4.22–5.81)
RDW: 23.6 % — ABNORMAL HIGH (ref 11.5–15.5)
WBC: 9.6 10*3/uL (ref 4.0–10.5)
nRBC: 0 % (ref 0.0–0.2)

## 2023-08-17 LAB — BASIC METABOLIC PANEL
Anion gap: 12 (ref 5–15)
BUN: 9 mg/dL (ref 8–23)
CO2: 21 mmol/L — ABNORMAL LOW (ref 22–32)
Calcium: 9.1 mg/dL (ref 8.9–10.3)
Chloride: 102 mmol/L (ref 98–111)
Creatinine, Ser: 0.97 mg/dL (ref 0.61–1.24)
GFR, Estimated: >60 mL/min (ref >60)
Glucose, Bld: 134 mg/dL — ABNORMAL HIGH (ref 70–99)
Potassium: 3.5 mmol/L (ref 3.5–5.1)
Sodium: 135 mmol/L (ref 135–145)

## 2023-08-17 LAB — LACTIC ACID, PLASMA
Lactic Acid, Venous: 2.1 mmol/L (ref 0.5–1.9)
Lactic Acid, Venous: 1.9 mmol/L (ref 0.5–1.9)

## 2023-08-17 LAB — TROPONIN I (HIGH SENSITIVITY)
Troponin I (High Sensitivity): 12 ng/L (ref <18)
Troponin I (High Sensitivity): 14 ng/L (ref <18)

## 2023-08-17 MED ORDER — SODIUM CHLORIDE 0.9 % IV SOLN
2.0000 g | INTRAVENOUS | Status: DC
  Administered 2023-08-17 – 2023-08-18 (×2): 2 g via INTRAVENOUS
  Filled 2023-08-17 (×2): qty 20

## 2023-08-17 MED ORDER — AZITHROMYCIN 250 MG PO TABS
500.0000 mg | ORAL_TABLET | Freq: Once | ORAL | Status: DC
Start: 2023-08-17 — End: 2023-08-17
  Filled 2023-08-17: qty 2

## 2023-08-17 MED ORDER — DIPHENHYDRAMINE HCL 50 MG/ML IJ SOLN
50.0000 mg | Freq: Once | INTRAMUSCULAR | Status: AC
Start: 2023-08-17 — End: 2023-08-17

## 2023-08-17 MED ORDER — IPRATROPIUM BROMIDE 0.02 % IN SOLN
0.5000 mg | Freq: Once | RESPIRATORY_TRACT | Status: AC
Start: 2023-08-17 — End: 2023-08-17
  Administered 2023-08-17: 0.5 mg via RESPIRATORY_TRACT
  Filled 2023-08-17: qty 2.5

## 2023-08-17 MED ORDER — METHYLPREDNISOLONE SODIUM SUCC 40 MG IJ SOLR
40.0000 mg | Freq: Once | INTRAMUSCULAR | Status: AC
Start: 2023-08-17 — End: 2023-08-17
  Administered 2023-08-17: 40 mg via INTRAVENOUS
  Filled 2023-08-17: qty 1

## 2023-08-17 MED ORDER — ENOXAPARIN SODIUM 80 MG/0.8ML IJ SOSY
80.0000 mg | PREFILLED_SYRINGE | Freq: Once | INTRAMUSCULAR | Status: AC
  Administered 2023-08-17: 80 mg via SUBCUTANEOUS
  Filled 2023-08-17: qty 0.8

## 2023-08-17 MED ORDER — MORPHINE SULFATE (PF) 4 MG/ML IV SOLN
4.0000 mg | Freq: Once | INTRAVENOUS | Status: DC
  Filled 2023-08-17: qty 1

## 2023-08-17 MED ORDER — DEXTROSE 5 % IV SOLN
500.0000 mg | INTRAVENOUS | Status: DC
  Administered 2023-08-17 – 2023-08-18 (×2): 500 mg via INTRAVENOUS
  Filled 2023-08-17 (×4): qty 5

## 2023-08-17 MED ORDER — LACTATED RINGERS IV BOLUS (SEPSIS)
500.0000 mL | Freq: Once | INTRAVENOUS | Status: AC
  Administered 2023-08-17: 500 mL via INTRAVENOUS

## 2023-08-17 MED ORDER — SODIUM CHLORIDE 0.9 % IV BOLUS
500.0000 mL | Freq: Once | INTRAVENOUS | Status: AC
  Administered 2023-08-17: 500 mL via INTRAVENOUS

## 2023-08-17 MED ORDER — DIPHENHYDRAMINE HCL 25 MG PO CAPS
50.0000 mg | ORAL_CAPSULE | Freq: Once | ORAL | Status: AC
Start: 2023-08-17 — End: 2023-08-17
  Administered 2023-08-17: 50 mg via ORAL
  Filled 2023-08-17: qty 2

## 2023-08-17 MED ORDER — MORPHINE SULFATE (PF) 4 MG/ML IV SOLN
4.0000 mg | Freq: Once | INTRAVENOUS | Status: AC
  Administered 2023-08-17: 4 mg via INTRAVENOUS
  Filled 2023-08-17: qty 1

## 2023-08-17 MED ORDER — IOHEXOL 350 MG/ML SOLN
75.0000 mL | Freq: Once | INTRAVENOUS | Status: AC | PRN
  Administered 2023-08-17: 75 mL via INTRAVENOUS

## 2023-08-17 MED ORDER — SODIUM CHLORIDE 0.9 % IV SOLN
1.0000 g | Freq: Once | INTRAVENOUS | Status: DC
  Filled 2023-08-17: qty 10

## 2023-08-17 NOTE — ED Notes (Signed)
CT aware of premedications given and timing of CT scan.

## 2023-08-17 NOTE — ED Provider Notes (Signed)
[place] EMERGENCY DEPARTMENT AT [facility] Provider Note   CSN: [id number] Arrival date & time: 08/17/23  1510     History {Add pertinent medical, surgical, social history, OB history to HPI:1} Chief Complaint  Patient presents with   Back Pain    Jonathan Dickson is a 67 y.o. male, history of CHF, DM2, presents to the ED secondary to a severe shortness of breath, that occurred today, after being woke up, by severe burning pain, in his neck, and shoulders.  He states that this started today early this morning, and that he was concerned because he recently had a cervical decompression and fusion of C7-T1 by Dr. Yevette Edwards on 11/14.  He states that he burning pain, from his left neck, to his left shoulders, and that it feels swollen and tender.  He states it is severe, and he took his pain meds, muscle relaxers without relief.  He is coming compliant in wearing his cervical collar, as instructed.  He also states that he is very short of breath, is having difficulty speaking full sentences, and states that hurts when he takes a deep breath.  Denies any fevers, chills.    Home Medications Prior to Admission medications   Medication Sig Start Date End Date Taking? Authorizing Provider  acetaminophen (TYLENOL) 500 MG tablet Take 1,000 mg by mouth every 6 (six) hours as needed for moderate pain or headache.    [provider]  albuterol (VENTOLIN HFA) 108 (90 Base) MCG/ACT inhaler INHALE 2 PUFFS INTO THE LUNGS EVERY 6 HOURS AS NEEDED FOR WHEEZING OR SHORTNESS OF BREATH 05/02/23   Parrett, Virgel Bouquet, NP  Aromatic Inhalants (VICKS VAPOINHALER) INHA Inhale 1 Dose into the lungs daily as needed (congestion).    [provider]  atorvastatin (LIPITOR) 80 MG tablet Take 1 tablet (80 mg total) by mouth daily. 02/05/23   Claiborne Rigg, NP  carvedilol (COREG) 12.5 MG tablet Take 1 tablet (12.5 mg total) by mouth 2 (two) times daily with a meal. NEEDS FOLLOW UP APPOINTMENT FOR MORE  REFILLS 08/15/23   Milford, Anderson Malta, FNP  Chlorphen-Phenyleph-ASA (ALKA-SELTZER PLUS COLD PO) Take 1-2 tablets by mouth 2 (two) times daily as needed (cold symptoms).     [provider]  cholecalciferol (VITAMIN D3) 25 MCG (1000 UNIT) tablet Take 1,000 Units by mouth daily. 11/25/17   [provider]  dapagliflozin propanediol (FARXIGA) 10 MG TABS tablet Take 1 tablet (10 mg total) by mouth daily. 12/03/22   Bensimhon, Bevelyn Buckles, MD  diclofenac Sodium (VOLTAREN) 1 % GEL Apply 2 g topically 4 (four) times daily as needed (pain).    [provider]  dicyclomine (BENTYL) 20 MG tablet Take 1 tablet (20 mg total) by mouth 2 (two) times daily. 07/31/23   Simeon Craft, PA-C  fluticasone (FLONASE) 50 MCG/ACT nasal spray SHAKE LIQUID AND USE 1 SPRAY IN [facility] NOSTRIL DAILY 08/12/23   Charlott Holler, MD  gabapentin (NEURONTIN) 300 MG capsule Take 300 mg by mouth daily as needed. 07/07/23   [provider]  HYDROcodone-acetaminophen (NORCO/VICODIN) 5-325 MG tablet Take 1 tablet by mouth every 6 (six) hours as needed for moderate pain (pain score 4-6) or severe pain (pain score 7-10). 08/14/23 08/13/24  McKenzie, Eilene Ghazi, PA-C  loratadine (CLARITIN) 10 MG tablet Take 1 tablet (10 mg total) by mouth daily. Patient taking differently: Take 10 mg by mouth daily as needed for allergies. 02/05/23   Claiborne Rigg, NP  metFORMIN (GLUCOPHAGE)  500 MG tablet TAKE 1 TABLET(500 MG) BY MOUTH DAILY WITH BREAKFAST 08/01/23   Claiborne Rigg, NP  methocarbamol (ROBAXIN) 500 MG tablet Take 1-2 tablets (500-1,000 mg total) by mouth every 8 (eight) hours as needed for muscle spasms. 08/14/23   McKenzie, Eilene Ghazi, PA-C  Misc. Devices MISC by Does not apply route. Flutter valve daily    [provider]  omeprazole (PRILOSEC OTC) 20 MG tablet Take 1 tablet (20 mg total) by mouth daily. Take 1 tablet daily for the next 2 weeks and then as needed for reflux 07/28/23 07/27/24  Armbruster,  Willaim Rayas, MD  ondansetron (ZOFRAN) 4 MG tablet Take 1 tablet (4 mg total) by mouth every 6 (six) hours. 07/31/23   Simeon Craft, PA-C  sacubitril-valsartan (ENTRESTO) 49-51 MG Take 1 tablet by mouth 2 (two) times daily. NEEDS FOLLOW UP APPOINTMENT FOR MORE REFILLS 08/06/23   Jacklynn Ganong, FNP  spironolactone (ALDACTONE) 25 MG tablet Take 0.5 tablets (12.5 mg total) by mouth daily. TAKE 1/2 TABLET(12.5 MG) BY MOUTH DAILY 11/18/22   [place], Anderson Malta, FNP  Tafamidis [facility]) 61 MG CAPS Take 1 capsule by mouth daily. 11/19/22   Bensimhon, Bevelyn Buckles, MD      Allergies    Peanut-containing drug products, Penicillins, Decadron [dexamethasone], and Ivp dye [iodinated contrast media]    Review of Systems   Review of Systems  Respiratory:  Positive for shortness of breath.   Cardiovascular:  Positive for chest pain. Negative for leg swelling.  Musculoskeletal:  Positive for back pain.    Physical Exam Updated Vital Signs Ht 5\' 6"  (1.676 m)   Wt 72.6 kg   BMI 25.82 kg/m  Physical Exam Vitals and nursing note reviewed.  Constitutional:      General: He is not in acute distress.    Appearance: He is well-developed.  HENT:     Head: Normocephalic and atraumatic.  Eyes:     Conjunctiva/sclera: Conjunctivae normal.  Neck:     Comments: C-collar in place Cardiovascular:     Rate and Rhythm: Normal rate and regular rhythm.     Heart sounds: No murmur heard. Pulmonary:     Effort: Pulmonary effort is normal. No respiratory distress.     Breath sounds: Examination of the left-lower field reveals rales. Rales present.  Abdominal:     Palpations: Abdomen is soft.     Tenderness: There is no abdominal tenderness.  Musculoskeletal:        General: No swelling.     Cervical back: Neck supple.     Comments: Tenderness to palpation of left cervical paraspinal muscles and left trapezius as well as some mild edema noted.  No erythema, or warmth.  Skin:    General: Skin is warm and dry.      Capillary Refill: Capillary refill takes less than 2 seconds.  Neurological:     Mental Status: He is alert.  Psychiatric:        Mood and Affect: Mood normal.     ED Results / Procedures / Treatments   Labs (all labs ordered are listed, but only abnormal results are displayed) Labs Reviewed  BASIC METABOLIC PANEL - Abnormal; Notable for the following components:      Result Value   CO2 21 (*)    Glucose, Bld 134 (*)    All other components within normal limits  CBC - Abnormal; Notable for the following components:   MCV 79.3 (*)    MCH 24.4 (*)  RDW 23.6 (*)    All other components within normal limits  TROPONIN I (HIGH SENSITIVITY)  TROPONIN I (HIGH SENSITIVITY)    EKG EKG Interpretation Date/Time:  Sunday August 17 2023 15:35:22 EST Ventricular Rate:  118 PR Interval:  156 QRS Duration:  96 QT Interval:  332 QTC Calculation: 465 R Axis:   -15  Text Interpretation: Sinus tachycardia with occasional Premature ventricular complexes Possible Anterior infarct , age undetermined Abnormal ECG When compared with ECG of 23-May-2021 10:05, PREVIOUS ECG IS PRESENT when compared top rior, faster rate with PVC. No STEMI Confirmed by Theda Belfast ([id number]) on 08/17/2023 4:46:50 PM  Radiology DG Chest 2 View  Result Date: 08/17/2023 CLINICAL DATA:  Back pain and shortness of breath. EXAM: CHEST - 2 VIEW COMPARISON:  July 31, 2023 FINDINGS: Single lead pacemaker in stable position. The cardiac silhouette is enlarged. Mediastinal contours appear intact. Patchy airspace consolidation in the left lower lobe and an element of volume loss. No definite pleural effusion. Osseous structures are without acute abnormality. Soft tissues are grossly normal. IMPRESSION: 1. Patchy airspace consolidation in the left lower lobe and an element of volume loss, concerning for pneumonia. 2. Cardiomegaly. Electronically Signed   By: Ted Mcalpine M.D.   On: 08/17/2023 16:06     Procedures Procedures  {Document cardiac monitor, telemetry assessment procedure when appropriate:1}  Medications Ordered in ED Medications - No data to display  ED Course/ Medical Decision Making/ A&P   {   Click here for ABCD2, HEART and other calculatorsREFRESH Note before signing :1}                              Medical Decision Making Discussed patient's burning pain, around his back,/neck and swelling, with Elodia Florence PA [facility], he does not recommend an MRI, recommends that do general medical workup, pain is not likely from postoperative issue other than a possible hematoma, which he states is normal for this.  Does not recommend MRI because it was show general inflammation and swelling, given that the surgery was generally 3 days ago.  He states he recommends working up patient's pneumonia for further management  Amount and/or Complexity of Data Reviewed Labs: ordered. Radiology: ordered.  Risk Prescription drug management.   ***  {Document critical care time when appropriate:1} {Document review of labs and clinical decision tools ie heart score, Chads2Vasc2 etc:1}  {Document your independent review of radiology images, and any outside records:1} {Document your discussion with family members, caretakers, and with consultants:1} {Document social determinants of health affecting pt's care:1} {Document your decision making why or why not admission, treatments were needed:1} Final Clinical Impression(s) / ED Diagnoses Final diagnoses:  None    Rx / DC Orders ED Discharge Orders     None

## 2023-08-17 NOTE — ED Triage Notes (Signed)
Pt came to ED, pt got back surgery Thursday and today woke up with severe back pain and SHOB. Pt arrives in aspen collar. Pt took muscle relaxer and pain medication today. [email or web address]

## 2023-08-17 NOTE — ED Notes (Signed)
Attempted to start an IV, was unsuccessful, advised Bro[place], [place], asked for ultrasound guided IV, patient states this is usually his standard of care.

## 2023-08-17 NOTE — Sepsis Progress Note (Signed)
Sepsis protocol is being followed by [facility]. Requested lactic acid order from provider.

## 2023-08-17 NOTE — ED Provider Notes (Signed)
67 yo male with right shoulder/night tight/tingling in aspen collar post op.[facility]. Pending PE study, contrast allergy. PNA on CXR, has had abx and cx. Initial lactic 2.1, repeat down to 1.9 Admit after CT.  Physical Exam  BP (!) 135/95   Pulse (!) 113   Temp 97.7 F (36.5 C) (Oral)   Resp (!) 29   Ht 5\' 6"  (1.676 m)   Wt 72.6 kg   SpO2 95%   BMI 25.82 kg/m   Physical Exam  Procedures  Procedures  ED Course / MDM    Medical Decision Making Amount and/or Complexity of Data Reviewed Labs: ordered. Radiology: ordered.  Risk Prescription drug management. Decision regarding hospitalization.   CT negative for PE, plan to admit for PNA. Updated patient with results and plan of care briefly, pt using urinal and did not want to talk.   Case discussed with Dr. Antionette Char who will consult for admission.      Jeannie Fend, PA-C 08/18/23 1610    Zadie Rhine, MD 08/18/23 [phone]

## 2023-08-18 ENCOUNTER — Encounter (HOSPITAL_COMMUNITY): Payer: Self-pay | Admitting: Family Medicine

## 2023-08-18 DIAGNOSIS — I110 Hypertensive heart disease with heart failure: Secondary | ICD-10-CM | POA: Diagnosis present

## 2023-08-18 DIAGNOSIS — J189 Pneumonia, unspecified organism: Secondary | ICD-10-CM | POA: Diagnosis present

## 2023-08-18 DIAGNOSIS — Z833 Family history of diabetes mellitus: Secondary | ICD-10-CM | POA: Diagnosis not present

## 2023-08-18 DIAGNOSIS — Z86711 Personal history of pulmonary embolism: Secondary | ICD-10-CM | POA: Diagnosis not present

## 2023-08-18 DIAGNOSIS — I5022 Chronic systolic (congestive) heart failure: Secondary | ICD-10-CM | POA: Diagnosis present

## 2023-08-18 DIAGNOSIS — Z79899 Other long term (current) drug therapy: Secondary | ICD-10-CM | POA: Diagnosis not present

## 2023-08-18 DIAGNOSIS — Z91041 Radiographic dye allergy status: Secondary | ICD-10-CM | POA: Diagnosis not present

## 2023-08-18 DIAGNOSIS — I43 Cardiomyopathy in diseases classified elsewhere: Secondary | ICD-10-CM

## 2023-08-18 DIAGNOSIS — Z91010 Allergy to peanuts: Secondary | ICD-10-CM | POA: Diagnosis not present

## 2023-08-18 DIAGNOSIS — Z981 Arthrodesis status: Secondary | ICD-10-CM | POA: Diagnosis not present

## 2023-08-18 DIAGNOSIS — J181 Lobar pneumonia, unspecified organism: Secondary | ICD-10-CM | POA: Diagnosis present

## 2023-08-18 DIAGNOSIS — M160 Bilateral primary osteoarthritis of hip: Secondary | ICD-10-CM | POA: Diagnosis present

## 2023-08-18 DIAGNOSIS — Z7901 Long term (current) use of anticoagulants: Secondary | ICD-10-CM | POA: Diagnosis not present

## 2023-08-18 DIAGNOSIS — E785 Hyperlipidemia, unspecified: Secondary | ICD-10-CM | POA: Diagnosis present

## 2023-08-18 DIAGNOSIS — Z95810 Presence of automatic (implantable) cardiac defibrillator: Secondary | ICD-10-CM | POA: Diagnosis not present

## 2023-08-18 DIAGNOSIS — Z888 Allergy status to other drugs, medicaments and biological substances status: Secondary | ICD-10-CM | POA: Diagnosis not present

## 2023-08-18 DIAGNOSIS — M5000 Cervical disc disorder with myelopathy, unspecified cervical region: Secondary | ICD-10-CM | POA: Diagnosis not present

## 2023-08-18 DIAGNOSIS — Z86718 Personal history of other venous thrombosis and embolism: Secondary | ICD-10-CM | POA: Diagnosis not present

## 2023-08-18 DIAGNOSIS — E119 Type 2 diabetes mellitus without complications: Secondary | ICD-10-CM | POA: Diagnosis present

## 2023-08-18 DIAGNOSIS — Z880 Allergy status to penicillin: Secondary | ICD-10-CM | POA: Diagnosis not present

## 2023-08-18 DIAGNOSIS — Z87891 Personal history of nicotine dependence: Secondary | ICD-10-CM | POA: Diagnosis not present

## 2023-08-18 DIAGNOSIS — I428 Other cardiomyopathies: Secondary | ICD-10-CM | POA: Diagnosis present

## 2023-08-18 DIAGNOSIS — E854 Organ-limited amyloidosis: Secondary | ICD-10-CM | POA: Diagnosis present

## 2023-08-18 DIAGNOSIS — Z7984 Long term (current) use of oral hypoglycemic drugs: Secondary | ICD-10-CM | POA: Diagnosis not present

## 2023-08-18 DIAGNOSIS — I2510 Atherosclerotic heart disease of native coronary artery without angina pectoris: Secondary | ICD-10-CM | POA: Diagnosis present

## 2023-08-18 DIAGNOSIS — I252 Old myocardial infarction: Secondary | ICD-10-CM | POA: Diagnosis not present

## 2023-08-18 LAB — HEMOGLOBIN A1C
Hgb A1c MFr Bld: 6.5 % — ABNORMAL HIGH (ref 4.8–5.6)
Mean Plasma Glucose: 139.85 mg/dL

## 2023-08-18 LAB — MAGNESIUM: Magnesium: 2.1 mg/dL (ref 1.7–2.4)

## 2023-08-18 LAB — BASIC METABOLIC PANEL
Anion gap: 11 (ref 5–15)
BUN: 12 mg/dL (ref 8–23)
CO2: 20 mmol/L — ABNORMAL LOW (ref 22–32)
Calcium: 9.1 mg/dL (ref 8.9–10.3)
Chloride: 104 mmol/L (ref 98–111)
Creatinine, Ser: 0.99 mg/dL (ref 0.61–1.24)
GFR, Estimated: >60 mL/min (ref >60)
Glucose, Bld: 123 mg/dL — ABNORMAL HIGH (ref 70–99)
Potassium: 4.1 mmol/L (ref 3.5–5.1)
Sodium: 135 mmol/L (ref 135–145)

## 2023-08-18 LAB — GLUCOSE, CAPILLARY
Glucose-Capillary: 140 mg/dL — ABNORMAL HIGH (ref 70–99)
Glucose-Capillary: 139 mg/dL — ABNORMAL HIGH (ref 70–99)
Glucose-Capillary: 139 mg/dL — ABNORMAL HIGH (ref 70–99)
Glucose-Capillary: 126 mg/dL — ABNORMAL HIGH (ref 70–99)
Glucose-Capillary: 114 mg/dL — ABNORMAL HIGH (ref 70–99)

## 2023-08-18 LAB — HIV ANTIBODY (ROUTINE TESTING W REFLEX): HIV Screen 4th Generation wRfx: NONREACTIVE

## 2023-08-18 LAB — CBC
HCT: 41.2 % (ref 39.0–52.0)
Hemoglobin: 12.7 g/dL — ABNORMAL LOW (ref 13.0–17.0)
MCH: 24.1 pg — ABNORMAL LOW (ref 26.0–34.0)
MCHC: 30.8 g/dL (ref 30.0–36.0)
MCV: 78.2 fL — ABNORMAL LOW (ref 80.0–100.0)
Platelets: 207 10*3/uL (ref 150–400)
RBC: 5.27 MIL/uL (ref 4.22–5.81)
RDW: 23.4 % — ABNORMAL HIGH (ref 11.5–15.5)
WBC: 7 10*3/uL (ref 4.0–10.5)
nRBC: 0 % (ref 0.0–0.2)

## 2023-08-18 LAB — PROTIME-INR
INR: 1.4 — ABNORMAL HIGH (ref 0.8–1.2)
Prothrombin Time: 17.5 s — ABNORMAL HIGH (ref 11.4–15.2)

## 2023-08-18 LAB — STREP PNEUMONIAE URINARY ANTIGEN: Strep Pneumo Urinary Antigen: NEGATIVE

## 2023-08-18 MED ORDER — ATORVASTATIN CALCIUM 80 MG PO TABS
80.0000 mg | ORAL_TABLET | Freq: Every day | ORAL | Status: DC
  Administered 2023-08-18 – 2023-08-19 (×2): 80 mg via ORAL
  Filled 2023-08-18 (×2): qty 1

## 2023-08-18 MED ORDER — DICYCLOMINE HCL 20 MG PO TABS
20.0000 mg | ORAL_TABLET | Freq: Two times a day (BID) | ORAL | Status: DC
Start: 2023-08-18 — End: 2023-08-19
  Administered 2023-08-18 – 2023-08-19 (×3): 20 mg via ORAL
  Filled 2023-08-18 (×4): qty 1

## 2023-08-18 MED ORDER — OXYCODONE HCL 5 MG PO TABS
5.0000 mg | ORAL_TABLET | ORAL | Status: DC | PRN
Start: 2023-08-18 — End: 2023-08-19

## 2023-08-18 MED ORDER — FLUTICASONE PROPIONATE 50 MCG/ACT NA SUSP
1.0000 | Freq: Every day | NASAL | Status: DC
  Administered 2023-08-18 – 2023-08-19 (×2): 1 via NASAL
  Filled 2023-08-18: qty 16

## 2023-08-18 MED ORDER — ONDANSETRON HCL 4 MG PO TABS: 4.0000 mg | ORAL_TABLET | Freq: Four times a day (QID) | ORAL | Status: DC | PRN

## 2023-08-18 MED ORDER — INSULIN ASPART 100 UNIT/ML IJ SOLN: 0.0000 [IU] | Freq: Three times a day (TID) | INTRAMUSCULAR | Status: DC

## 2023-08-18 MED ORDER — GUAIFENESIN 100 MG/5ML PO LIQD: 5.0000 mL | ORAL | Status: DC | PRN

## 2023-08-18 MED ORDER — GABAPENTIN 300 MG PO CAPS
300.0000 mg | ORAL_CAPSULE | Freq: Two times a day (BID) | ORAL | Status: DC
Start: 2023-08-18 — End: 2023-08-19
  Administered 2023-08-18 – 2023-08-19 (×3): 300 mg via ORAL
  Filled 2023-08-18 (×3): qty 1

## 2023-08-18 MED ORDER — SPIRONOLACTONE 12.5 MG HALF TABLET
12.5000 mg | ORAL_TABLET | Freq: Every day | ORAL | Status: DC
  Administered 2023-08-18 – 2023-08-19 (×2): 12.5 mg via ORAL
  Filled 2023-08-18 (×2): qty 1

## 2023-08-18 MED ORDER — CARVEDILOL 12.5 MG PO TABS
12.5000 mg | ORAL_TABLET | Freq: Two times a day (BID) | ORAL | Status: DC
  Administered 2023-08-18 – 2023-08-19 (×3): 12.5 mg via ORAL
  Filled 2023-08-18 (×3): qty 1

## 2023-08-18 MED ORDER — WARFARIN - PHARMACIST DOSING INPATIENT: Freq: Every day | Status: DC

## 2023-08-18 MED ORDER — METHOCARBAMOL 500 MG PO TABS: 500.0000 mg | ORAL_TABLET | Freq: Three times a day (TID) | ORAL | Status: DC | PRN

## 2023-08-18 MED ORDER — TAFAMIDIS 61 MG PO CAPS: 1.0000 | ORAL_CAPSULE | Freq: Every day | ORAL | Status: DC

## 2023-08-18 MED ORDER — POLYETHYLENE GLYCOL 3350 17 G PO PACK: 17.0000 g | PACK | Freq: Every day | ORAL | Status: DC | PRN

## 2023-08-18 MED ORDER — ACETAMINOPHEN 650 MG RE SUPP
650.0000 mg | Freq: Four times a day (QID) | RECTAL | Status: DC | PRN
Start: 2023-08-18 — End: 2023-08-19

## 2023-08-18 MED ORDER — SODIUM CHLORIDE 0.9% FLUSH
3.0000 mL | Freq: Two times a day (BID) | INTRAVENOUS | Status: DC
  Administered 2023-08-18 – 2023-08-19 (×3): 3 mL via INTRAVENOUS

## 2023-08-18 MED ORDER — HYDROMORPHONE HCL 1 MG/ML IJ SOLN: 0.5000 mg | INTRAMUSCULAR | Status: DC | PRN

## 2023-08-18 MED ORDER — INSULIN ASPART 100 UNIT/ML IJ SOLN: 0.0000 [IU] | Freq: Every day | INTRAMUSCULAR | Status: DC

## 2023-08-18 MED ORDER — WARFARIN SODIUM 5 MG PO TABS
5.0000 mg | ORAL_TABLET | Freq: Once | ORAL | Status: AC
  Administered 2023-08-18: 5 mg via ORAL
  Filled 2023-08-18: qty 1

## 2023-08-18 MED ORDER — VITAMIN D 25 MCG (1000 UNIT) PO TABS
1000.0000 [IU] | ORAL_TABLET | Freq: Every day | ORAL | Status: DC
  Administered 2023-08-18 – 2023-08-19 (×2): 1000 [IU] via ORAL
  Filled 2023-08-18 (×2): qty 1

## 2023-08-18 MED ORDER — SODIUM CHLORIDE 0.9 % IV BOLUS
1000.0000 mL | Freq: Once | INTRAVENOUS | Status: AC
  Administered 2023-08-18: 1000 mL via INTRAVENOUS

## 2023-08-18 MED ORDER — ACETAMINOPHEN 325 MG PO TABS: 650.0000 mg | ORAL_TABLET | Freq: Four times a day (QID) | ORAL | Status: DC | PRN

## 2023-08-18 MED ORDER — ONDANSETRON HCL 4 MG/2ML IJ SOLN: 4.0000 mg | Freq: Four times a day (QID) | INTRAMUSCULAR | Status: DC | PRN

## 2023-08-18 MED ORDER — SACUBITRIL-VALSARTAN 49-51 MG PO TABS
1.0000 | ORAL_TABLET | Freq: Two times a day (BID) | ORAL | Status: DC
  Administered 2023-08-18 – 2023-08-19 (×3): 1 via ORAL
  Filled 2023-08-18 (×4): qty 1

## 2023-08-18 MED ORDER — ALBUTEROL SULFATE (2.5 MG/3ML) 0.083% IN NEBU: 2.5000 mg | INHALATION_SOLUTION | Freq: Four times a day (QID) | RESPIRATORY_TRACT | Status: DC | PRN

## 2023-08-18 MED ORDER — ENOXAPARIN SODIUM 80 MG/0.8ML IJ SOSY
80.0000 mg | PREFILLED_SYRINGE | Freq: Two times a day (BID) | INTRAMUSCULAR | Status: DC
  Administered 2023-08-18 – 2023-08-19 (×3): 80 mg via SUBCUTANEOUS
  Filled 2023-08-18 (×4): qty 0.8

## 2023-08-18 NOTE — Progress Notes (Addendum)
Presents with c/o neck and shoulder pain, SOB, cough. Pt with pneumonia. Hx s/p recent anterior cervical decompression and fusion on 08/14/2023,  h/o PE/DVT( coumadin).   08/18/23 0814  TOC Brief Assessment  Insurance and Status Reviewed  Patient has primary care physician Yes  Home environment has been reviewed From home alone.  Prior level of function: PTA independent with ADL's , no DME usage  Prior/Current Home Services No current home services  Social Determinants of Health Reivew SDOH reviewed no interventions necessary  Readmission risk has been reviewed No  Transition of care needs no transition of care needs at this time   Pt without transportation issues. Supportive family. No RX med concerns.  TOC team following and will assist with needs as they present. Gae Gallop RN,BSN,CM [phone]

## 2023-08-18 NOTE — Plan of Care (Signed)
  Problem: Education: Goal: Ability to describe self-care measures that may prevent or decrease complications (Diabetes Survival Skills Education) will improve Outcome: Progressing Goal: Individualized Educational Video(s) Outcome: Progressing   Problem: Coping: Goal: Ability to adjust to condition or change in health will improve Outcome: Progressing   Problem: Fluid Volume: Goal: Ability to maintain a balanced intake and output will improve Outcome: Progressing   Problem: Health Behavior/Discharge Planning: Goal: Ability to identify and utilize available resources and services will improve Outcome: Progressing Goal: Ability to manage health-related needs will improve Outcome: Progressing   Problem: Metabolic: Goal: Ability to maintain appropriate glucose levels will improve Outcome: Progressing   Problem: Nutritional: Goal: Maintenance of adequate nutrition will improve Outcome: Progressing Goal: Progress toward achieving an optimal weight will improve Outcome: Progressing   Problem: Skin Integrity: Goal: Risk for impaired skin integrity will decrease Outcome: Progressing   Problem: Tissue Perfusion: Goal: Adequacy of tissue perfusion will improve Outcome: Progressing   Problem: Education: Goal: Knowledge of General Education information will improve Description: Including pain rating scale, medication(s)/side effects and non-pharmacologic comfort measures Outcome: Progressing   Problem: Health Behavior/Discharge Planning: Goal: Ability to manage health-related needs will improve Outcome: Progressing   Problem: Clinical Measurements: Goal: Ability to maintain clinical measurements within normal limits will improve Outcome: Progressing Goal: Will remain free from infection Outcome: Progressing Goal: Diagnostic test results will improve Outcome: Progressing Goal: Respiratory complications will improve Outcome: Progressing Goal: Cardiovascular complication will  be avoided Outcome: Progressing   Problem: Activity: Goal: Risk for activity intolerance will decrease Outcome: Progressing   Problem: Nutrition: Goal: Adequate nutrition will be maintained Outcome: Progressing   Problem: Coping: Goal: Level of anxiety will decrease Outcome: Progressing   Problem: Elimination: Goal: Will not experience complications related to bowel motility Outcome: Progressing Goal: Will not experience complications related to urinary retention Outcome: Progressing   Problem: Pain Management: Goal: General experience of comfort will improve Outcome: Progressing   Problem: Safety: Goal: Ability to remain free from injury will improve Outcome: Progressing   Problem: Skin Integrity: Goal: Risk for impaired skin integrity will decrease Outcome: Progressing   Problem: Activity: Goal: Ability to tolerate increased activity will improve Outcome: Progressing   Problem: Clinical Measurements: Goal: Ability to maintain a body temperature in the normal range will improve Outcome: Progressing   Problem: Respiratory: Goal: Ability to maintain adequate ventilation will improve Outcome: Progressing Goal: Ability to maintain a clear airway will improve Outcome: Progressing

## 2023-08-18 NOTE — H&P (Signed)
History and Physical    Jonathan Dickson ZOX:[id number] DOB: Jun 09, 1956 DOA: 08/17/2023  PCP: Claiborne Rigg, NP   Patient coming from: Home   Chief Complaint: Neck and shoulder pain, SOB, cough   HPI: Jonathan Dickson is a 67 y.o. male with medical history significant for history of DVT and PE, cardiac amyloidosis, chronic systolic CHF, and cervical spinal stenosis with radiculopathy status post ACDF on 1114, now presenting with severe neck and shoulder pain, cough, and shortness of breath.  Patient reports that he had been experiencing shortness of breath and productive cough for couple days and then developed severe burning pain across the back of his neck and shoulders at roughly 4:30 AM yesterday.  He initially thought that the pain was due to the way that he had slept, but it persisted throughout the day despite using his pain medications at home.  There is no acute focal numbness or weakness associated with this and the patient has not noticed any subjective fever or chills.  He denies leg swelling or chest pain.  ED Course: Upon arrival to the ED, patient is found to be afebrile and saturating well on room air with tachypnea, tachycardia, and stable blood pressure.  Labs are most notable for lactic acid 2.1.  CTA chest is negative for PE but concerning for left lower lobe pneumonia.  ED PA discussed the case with orthopedic surgery PA, blood cultures were collected, and the patient was treated with 2 L of IV fluids, and milligrams Lovenox, Atrovent, Rocephin, and azithromycin. He also received Benadryl and Solu-Medrol as pre-treatment for reported contrast allergy.   Review of Systems:  All other systems reviewed and apart from HPI, are negative.  Past Medical History:  Diagnosis Date   Abnormal TSH 02/2016   AICD (automatic cardioverter/defibrillator) present    St Jude   Anxiety    Blood clotting disorder (HCC)    Bronchiectasis (HCC)    left lower lung   Cardiac amyloidosis  (HCC)    Cervical disc disease    Chronic combined systolic and diastolic CHF (congestive heart failure) (HCC)    a. 01/2016 Echo: EF 25%, inf AK, diffuse sev HK, Gr1 DD, mild MR.   Coronary atherosclerosis    Depression    Diabetes mellitus without complication (HCC)    type 2   Dyspnea    with exertion   Dysrhythmia    Full thickness rotator cuff tear 06/2021   MRI   History of DVT (deep vein thrombosis) 08/04/2019   venous doppler US   History of hiatal hernia    Hx of adenomatous polyp of colon    Hyperglycemia    Hyperlipidemia    Hypertension    Hypertensive heart disease    Myocardial infarction (HCC)    greater than 5 yrs per patient on 08/06/23   NICM (nonischemic cardiomyopathy) (HCC)    a. 01/2016 Echo: EF 25%, inf AK, diffuse sev HK, Gr1 DD, mild MR; b. 01/2016 MV: EF 22%, no isch/infarct;  c. 02/2016 Cath: Nl cors, EF 35-45%.   Osteoarthritis of hips, bilateral    and hands   Paraseptal emphysema (HCC)    mild   Peanut allergy    Pneumonia    x 1   Pre-diabetes    hx but now DM 2   Presence of implantable cardioverter-defibrillator (ICD)    St Jude   S/P repair of ventral hernia    Seizures (HCC)    allergic with penicillin - only  one seizure   Sensorineural hearing loss 11/25/2017   no hearing aids, patient denies this dx   Substance abuse (HCC)    none in 20 yrs per patient as of 08/06/23    Past Surgical History:  Procedure Laterality Date   ANTERIOR CERVICAL DECOMP/DISCECTOMY FUSION N/A 07/10/2016   Procedure: ANTERIOR CERVICAL DECOMPRESSION FUSION CERVICAL 4-5, CERVICAL 5-6, CERVICAL 6-7 WITH INSTRUMENTATION AND ALLOGRAFT;  Surgeon: Estill Bamberg, MD;  Location: [facility] OR;  Service: Orthopedics;  Laterality: N/A;   ANTERIOR CERVICAL DECOMP/DISCECTOMY FUSION N/A 04/19/2021   Procedure: ANTERIOR CERVICAL DECOMPRESSION FUSION CERVICAL 3- CERVICAL 4 WITH INSTRUMENTATION AND ALLOGRAFT;  Surgeon: Estill Bamberg, MD;  Location: [facility] OR;  Service: Orthopedics;   Laterality: N/A;   BIOPSY  07/28/2023   Procedure: BIOPSY;  Surgeon: Benancio Deeds, MD;  Location: [facility];  Service: Gastroenterology;;   CARDIAC CATHETERIZATION N/A 03/11/2016   Procedure: Left Heart Cath and Coronary Angiography;  Surgeon: Marykay Lex, MD;  Location: [facility] INVASIVE CV LAB;  Service: Cardiovascular;  Laterality: N/A;   COLONOSCOPY     COLONOSCOPY WITH PROPOFOL N/A 07/28/2023   Procedure: COLONOSCOPY WITH PROPOFOL;  Surgeon: Benancio Deeds, MD;  Location: [facility];  Service: Gastroenterology;  Laterality: N/A;   ESOPHAGOGASTRODUODENOSCOPY (EGD) WITH PROPOFOL N/A 07/28/2023   Procedure: ESOPHAGOGASTRODUODENOSCOPY (EGD) WITH PROPOFOL;  Surgeon: Benancio Deeds, MD;  Location: [facility];  Service: Gastroenterology;  Laterality: N/A;   HOT HEMOSTASIS N/A 07/28/2023   Procedure: HOT HEMOSTASIS (ARGON PLASMA COAGULATION/BICAP);  Surgeon: Benancio Deeds, MD;  Location: [facility];  Service: Gastroenterology;  Laterality: N/A;   ICD IMPLANT N/A 06/17/2018   Procedure: ICD IMPLANT;  Surgeon: Regan Lemming, MD;  Location: [facility] INVASIVE CV LAB;  Service: Cardiovascular;  Laterality: N/A;   POLYPECTOMY  07/28/2023   Procedure: POLYPECTOMY;  Surgeon: Benancio Deeds, MD;  Location: [facility];  Service: Gastroenterology;;   Thumb surgery Right    VENTRAL HERNIA REPAIR N/A 10/25/2019   Procedure: PRIMARY VENTRAL HERNIA REPAIR;  Surgeon: Berna Bue, MD;  Location: [facility];  Service: General;  Laterality: N/A;    Social History:   reports that he quit smoking about 20 years ago. His smoking use included cigarettes. He started smoking about 38 years ago. He has a 27 pack-year smoking history. He has never used smokeless tobacco. He reports current drug use. Drugs: Heroin, Marijuana, and "Crack" cocaine. He reports that he does not drink alcohol.  Allergies  Allergen Reactions   Peanut-Containing Drug Products Swelling   Penicillins  Other (See Comments)    CONVULSIONS    Decadron [Dexamethasone] Itching   Ivp Dye [Iodinated Contrast Media]     Family History  Problem Relation Age of Onset   Diabetes Mother    Diabetes Sister    Clotting disorder Neg Hx    Lung disease Neg Hx    Colon cancer Neg Hx    Esophageal cancer Neg Hx    Stomach cancer Neg Hx    Pancreatic cancer Neg Hx    Colon polyps Neg Hx    Rectal cancer Neg Hx      Prior to Admission medications   Medication Sig Start Date End Date Taking? Authorizing Provider  acetaminophen (TYLENOL) 500 MG tablet Take 1,000 mg by mouth every 6 (six) hours as needed for moderate pain or headache.   Yes [provider]  albuterol (VENTOLIN HFA) 108 (90 Base) MCG/ACT inhaler INHALE 2 PUFFS INTO THE LUNGS EVERY 6 HOURS AS NEEDED  FOR WHEEZING OR SHORTNESS OF BREATH 05/02/23  Yes Parrett, Tammy S, NP  Aromatic Inhalants (VICKS VAPOINHALER) INHA Inhale 1 Dose into the lungs daily as needed (congestion).   Yes [provider]  atorvastatin (LIPITOR) 80 MG tablet Take 1 tablet (80 mg total) by mouth daily. 02/05/23  Yes Claiborne Rigg, NP  carvedilol (COREG) 12.5 MG tablet Take 1 tablet (12.5 mg total) by mouth 2 (two) times daily with a meal. NEEDS FOLLOW UP APPOINTMENT FOR MORE REFILLS 08/15/23  Yes Milford, Jessica M, FNP  Chlorphen-Phenyleph-ASA (ALKA-SELTZER PLUS COLD PO) Take 1-2 tablets by mouth 2 (two) times daily as needed (cold symptoms).    Yes [provider]  cholecalciferol (VITAMIN D3) 25 MCG (1000 UNIT) tablet Take 1,000 Units by mouth daily. 11/25/17  Yes [provider]  dapagliflozin propanediol (FARXIGA) 10 MG TABS tablet Take 1 tablet (10 mg total) by mouth daily. 12/03/22  Yes Bensimhon, Bevelyn Buckles, MD  dicyclomine (BENTYL) 20 MG tablet Take 1 tablet (20 mg total) by mouth 2 (two) times daily. 07/31/23  Yes Simeon Craft, PA-C  enoxaparin (LOVENOX) 80 MG/0.8ML injection Inject 80 mg into the skin every 12 (twelve)  hours.   Yes [provider]  fluticasone (FLONASE) 50 MCG/ACT nasal spray SHAKE LIQUID AND USE 1 SPRAY IN EACH NOSTRIL DAILY Patient taking differently: Place 1 spray into both nostrils daily. 08/12/23  Yes Charlott Holler, MD  gabapentin (NEURONTIN) 300 MG capsule Take 300 mg by mouth daily as needed. 07/07/23  Yes [provider]  HYDROcodone-acetaminophen (NORCO/VICODIN) 5-325 MG tablet Take 1 tablet by mouth every 6 (six) hours as needed for moderate pain (pain score 4-6) or severe pain (pain score 7-10). 08/14/23 08/13/24 Yes McKenzie, Eilene Ghazi, PA-C  metFORMIN (GLUCOPHAGE) 500 MG tablet TAKE 1 TABLET(500 MG) BY MOUTH DAILY WITH BREAKFAST Patient taking differently: Take 500 mg by mouth daily with breakfast. 08/01/23  Yes Claiborne Rigg, NP  methocarbamol (ROBAXIN) 500 MG tablet Take 1-2 tablets (500-1,000 mg total) by mouth every 8 (eight) hours as needed for muscle spasms. 08/14/23  Yes McKenzie, Kayla J, PA-C  sacubitril-valsartan (ENTRESTO) 49-51 MG Take 1 tablet by mouth 2 (two) times daily. NEEDS FOLLOW UP APPOINTMENT FOR MORE REFILLS 08/06/23  Yes [place], [place], [place]  spironolactone (ALDACTONE) 25 MG tablet Take 0.5 tablets (12.5 mg total) by mouth daily. TAKE 1/2 TABLET(12.5 MG) BY MOUTH DAILY 11/18/22  Yes Milford, Jessica M, FNP  Tafamidis (VYNDAMAX) 61 MG CAPS Take 1 capsule by mouth daily. 11/19/22  Yes Bensimhon, Bevelyn Buckles, MD  warfarin (COUMADIN) 5 MG tablet Take 5 mg by mouth See admin instructions. Patient takes 2 tablets on Monday, Wednesday, and Friday, and 1 tablet on all other days.   Yes [provider]  Misc. Devices MISC by Does not apply route. Flutter valve daily    [provider]    Physical Exam: Vitals:   08/18/23 0100 08/18/23 0138 08/18/23 0139 08/18/23 0140  BP: (!) 142/91   (!) 157/103  Pulse: (!) 104  (!) 105   Resp: (!) 28  15   Temp:  98.5 F (36.9 C)    TempSrc:      SpO2: 96%  96%   Weight:      Height:         Constitutional: NAD, calm  Eyes: PERTLA, lids and conjunctivae normal ENMT: Mucous membranes are moist. Posterior pharynx clear of any exudate or lesions.   Neck: supple, no masses  Respiratory: no wheezing, no crackles. No accessory muscle use.  Cardiovascular: S1 & S2 heard, regular rate and rhythm. No extremity edema.   Abdomen: No distension, no tenderness, soft. Bowel sounds active.  Musculoskeletal: no clubbing / cyanosis. No joint deformity upper and lower extremities.   Skin: no significant rashes, lesions, ulcers. Warm, dry, well-perfused. Neurologic: CN 2-12 grossly intact. Moving all extremities. Alert and oriented.  Psychiatric: Pleasant. Cooperative.    Labs and Imaging on Admission: I have personally reviewed following labs and imaging studies  CBC: Recent Labs  Lab 08/17/23 1531  WBC 9.6  HGB 13.1  HCT 42.5  MCV 79.3*  PLT 255   Basic Metabolic Panel: Recent Labs  Lab 08/17/23 1531  NA 135  K 3.5  CL 102  CO2 21*  GLUCOSE 134*  BUN 9  CREATININE 0.97  CALCIUM 9.1   GFR: Estimated Creatinine Clearance: 65.8 mL/min (by C-G formula based on SCr of 0.97 mg/dL). Liver Function Tests: No results for input(s): "AST", "ALT", "ALKPHOS", "BILITOT", "PROT", "ALBUMIN" in the last 168 hours. No results for input(s): "LIPASE", "AMYLASE" in the last 168 hours. No results for input(s): "AMMONIA" in the last 168 hours. Coagulation Profile: Recent Labs  Lab 08/14/23 0627  INR 1.0   Cardiac Enzymes: No results for input(s): "CKTOTAL", "CKMB", "CKMBINDEX", "TROPONINI" in the last 168 hours. BNP (last 3 results) No results for input(s): "PROBNP" in the last 8760 hours. HbA1C: No results for input(s): "HGBA1C" in the last 72 hours. CBG: Recent Labs  Lab 08/14/23 0619 08/14/23 1033  GLUCAP 104* 148*   Lipid Profile: No results for input(s): "CHOL", "HDL", "LDLCALC", "TRIG", "CHOLHDL", "LDLDIRECT" in the last 72 hours. Thyroid Function Tests: No results  for input(s): "TSH", "T4TOTAL", "FREET4", "T3FREE", "THYROIDAB" in the last 72 hours. Anemia Panel: No results for input(s): "VITAMINB12", "FOLATE", "FERRITIN", "TIBC", "IRON", "RETICCTPCT" in the last 72 hours. Urine analysis:    Component Value Date/Time   COLORURINE YELLOW 07/31/2023 0906   APPEARANCEUR CLEAR 07/31/2023 0906   LABSPEC 1.027 07/31/2023 0906   PHURINE 5.0 07/31/2023 0906   GLUCOSEU >=500 (A) 07/31/2023 0906   HGBUR NEGATIVE 07/31/2023 0906   BILIRUBINUR NEGATIVE 07/31/2023 0906   KETONESUR NEGATIVE 07/31/2023 0906   PROTEINUR NEGATIVE 07/31/2023 0906   NITRITE NEGATIVE 07/31/2023 0906   LEUKOCYTESUR NEGATIVE 07/31/2023 0906   Sepsis Labs: @LABRCNTIP (procalcitonin:4,lacticidven:4) )No results found for this or any previous visit (from the past 240 hour(s)).   Radiological Exams on Admission: CT Angio Chest PE W and/or Wo Contrast  Result Date: 08/18/2023 CLINICAL DATA:  Pulmonary embolism (PE) suspected, high prob. Severe back pain, shortness of breath EXAM: CT ANGIOGRAPHY CHEST WITH CONTRAST TECHNIQUE: Multidetector CT imaging of the chest was performed using the standard protocol during bolus administration of intravenous contrast. Multiplanar CT image reconstructions and MIPs were obtained to evaluate the vascular anatomy. RADIATION DOSE REDUCTION: This exam was performed according to the departmental dose-optimization program which includes automated exposure control, adjustment of the mA and/or kV according to patient size and/or use of iterative reconstruction technique. CONTRAST:  75mL OMNIPAQUE IOHEXOL 350 MG/ML SOLN COMPARISON:  03/14/2021 FINDINGS: Cardiovascular: No filling defects in the pulmonary arteries to suggest pulmonary emboli. Mild cardiomegaly. Scattered coronary artery and aortic calcifications. No evidence of aortic aneurysm. Mediastinum/Nodes: No mediastinal, hilar, or axillary adenopathy. Trachea and esophagus are unremarkable. Thyroid unremarkable.  Moderate-sized hiatal hernia. Lungs/Pleura: Left lower lobe airspace opacity concerning for pneumonia. Minimal right base atelectasis. No effusions. Upper Abdomen: Imaging into the upper abdomen demonstrates  no acute findings. Musculoskeletal: Left chest wall pacer in place. Leads in the right heart. Chest wall soft tissues are unremarkable. No acute bony abnormality. Review of the MIP images confirms the above findings. IMPRESSION: No evidence of pulmonary embolus. Focal opacity in the left lower lobe concerning for pneumonia. Moderate-sized hiatal hernia. Cardiomegaly, scattered coronary artery disease. Aortic Atherosclerosis (ICD10-I70.0). Electronically Signed   By: Charlett Nose M.D.   On: 08/18/2023 00:03   DG Chest 2 View  Result Date: 08/17/2023 CLINICAL DATA:  Back pain and shortness of breath. EXAM: CHEST - 2 VIEW COMPARISON:  July 31, 2023 FINDINGS: Single lead pacemaker in stable position. The cardiac silhouette is enlarged. Mediastinal contours appear intact. Patchy airspace consolidation in the left lower lobe and an element of volume loss. No definite pleural effusion. Osseous structures are without acute abnormality. Soft tissues are grossly normal. IMPRESSION: 1. Patchy airspace consolidation in the left lower lobe and an element of volume loss, concerning for pneumonia. 2. Cardiomegaly. Electronically Signed   By: Ted Mcalpine M.D.   On: 08/17/2023 16:06    EKG: Independently reviewed. Sinus tachycardia, rate 118, PVC.   Assessment/Plan   1. Pneumonia  - Blood cultures collected and antibiotics started in ED  - Continue Rocephin and azithromycin, check strep pneumo and legionella antigens, follow cultures and clinical course    2. Neck pain; s/p anterior cervical decompression and fusion on 08/14/23   - ED PA discussed with ortho PA who did not feel the pain was related to any worrisome postoperative complication and advised against imaging  - Pt is neurologically intact   - Continue pain-control, continue orthopedic surgery follow-up as planned    3. Chronic HFrEF; cardiac amyloidosis  - EF was 30-35% in August 2022; he has ICD  - Appears compensated   - Continue Aldactone, Coreg, Entresto, and tafamidis    4. Hx of PE  - No PE on CTA in ED today  - Continue warfarin with Lovenox bridge     DVT prophylaxis: Lovenox, warfarin  Code Status: Full  Level of Care: Level of care: Telemetry Medical Family Communication: None present  Disposition Plan:  Patient is from: home  Anticipated d/c is to: Home  Anticipated d/c date is: 08/20/23  Patient currently: Pending pain-control, treatment of pneumonia  Consults called: None  Admission status: Inpatient     Briscoe Deutscher, MD [facility]  08/18/2023, 2:01 AM

## 2023-08-18 NOTE — Progress Notes (Addendum)
PROGRESS NOTE   Jonathan Dickson  ZOX:[id number]    DOB: Dec 31, 1955    DOA: 08/17/2023  PCP: Claiborne Rigg, NP   I have briefly reviewed patients previous medical records in [facility].  Chief Complaint  Patient presents with   Back Pain    Brief Hospital Course:  67 y.o. male with medical history significant for history of DVT and PE, cardiac amyloidosis, chronic systolic CHF, type II DM, HTN, and cervical spinal stenosis with radiculopathy status post ACDF on 1114, now presenting with severe neck and shoulder pain, productive cough, and shortness of breath.  In ED, tachypneic and tachycardic.  CTA chest negative for PE but concerning for left lower lobe pneumonia.   Assessment & Plan:  Principal Problem:   Pneumonia Active Problems:   Cervical disc disease with myelopathy   Chronic systolic heart failure (HCC)   Cardiac amyloidosis (HCC)   Hx of pulmonary embolus   Lobar pneumonia Presented with productive cough, dyspnea, had tachypnea and tachycardia on arrival CTA chest without PE but showed focal opacity in the left lower lobe concerning for pneumonia. Continue empiric IV ceftriaxone and azithromycin. Lactate 2.1 > 1.9.  At bedtime troponin x 1 negative. Urinary strep pneumo antigen negative.  Blood cultures x 2: Negative to date. Improving, symptom wise and no longer tachypneic or tachycardic. Continue IV antibiotic for additional 24 hours, mobilize and possible DC home tomorrow.  Neck pain; s/p anterior cervical decompression and fusion on 08/14/23   - ED PA discussed with ortho PA who did not feel the pain was related to any worrisome postoperative complication and advised against imaging  - Pt is neurologically intact  - Continue pain-control, continue orthopedic surgery follow-up as planned  -Feels much better.  Has cervical collar in place.   Chronic HFrEF; cardiac amyloidosis  - EF was 30-35% in August 2022; he has ICD  - Appears compensated   - Continue  Aldactone, Coreg, Entresto, and tafamidis     Hx of PE  - No PE on CTA in ED today  - Continue warfarin with Lovenox bridge.  INR 1.4.  Hyperlipidemia: Continue statins  Hypertension Was higher prior, better now, continue Entresto, Aldactone and carvedilol.  May need to add as needed IV hydralazine if continues to have spikes.  Type II DM A1c, good outpatient control Hold metformin and continue very sensitive SSI.  Adjust insulins as needed.  Body mass index is 25.98 kg/m.   DVT prophylaxis:  On full dose Lovenox bridge and Coumadin   Code Status: Full Code:  Family Communication: Sister at bedside Disposition:  Status is: Inpatient Remains inpatient appropriate because: IV antibiotics     Consultants:     Procedures:     Antimicrobials:   IV ceftriaxone and azithromycin   Subjective:  Patient reports that he feels much better.  No more chest pain or back pain.  Chest pain was with coughing only.  Cough has also improved.  No dyspnea.  Reports a rough couple of weeks.  Underwent colonoscopy with Dr. Adela Lank, then neck surgery and now hospital admission.  Lives alone and independent.  Objective:   Vitals:   08/18/23 0140 08/18/23 0229 08/18/23 0500 08/18/23 0813  BP: (!) 157/103 (!) 157/103  (!) 149/93  Pulse:  (!) 102  85  Resp:  20    Temp:  98.5 F (36.9 C)  98.2 F (36.8 C)  TempSrc:  Oral  Oral  SpO2:  96%  97%  Weight:  73 kg   Height:        General exam: Middle-age male, moderately built and nourished sitting propped up in bed without distress.  Has cervical collar on. Respiratory system: Clear to auscultation anteriorly.  Occasional crackles in the bases otherwise clear posteriorly.  No wheezing or rhonchi. Cardiovascular system: S1 & S2 heard, RRR. No JVD, murmurs, rubs, gallops or clicks. No pedal edema.  Telemetry personally reviewed: Sinus rhythm.  Discontinued telemetry. Gastrointestinal system: Abdomen is nondistended, soft and nontender.  No organomegaly or masses felt. Normal bowel sounds heard. Central nervous system: Alert and oriented. No focal neurological deficits. Extremities: Symmetric 5 x 5 power. Skin: No rashes, lesions or ulcers Psychiatry: Judgement and insight appear normal. Mood & affect appropriate.     Data Reviewed:   I have personally reviewed following labs and imaging studies   CBC: Recent Labs  Lab 08/17/23 1531 08/18/23 0646  WBC 9.6 7.0  HGB 13.1 12.7*  HCT 42.5 41.2  MCV 79.3* 78.2*  PLT 255 207    Basic Metabolic Panel: Recent Labs  Lab 08/17/23 1531 08/18/23 0646  NA 135 135  K 3.5 4.1  CL 102 104  CO2 21* 20*  GLUCOSE 134* 123*  BUN 9 12  CREATININE 0.97 0.99  CALCIUM 9.1 9.1  MG  --  2.1    Liver Function Tests: No results for input(s): "AST", "ALT", "ALKPHOS", "BILITOT", "PROT", "ALBUMIN" in the last 168 hours.  CBG: Recent Labs  Lab 08/14/23 1033 08/18/23 0250 08/18/23 0743  GLUCAP 148* 140* 139*    Microbiology Studies:   Recent Results (from the past 240 hour(s))  Blood culture (routine x 2)     Status: None (Preliminary result)   Collection Time: 08/17/23  5:56 PM   Specimen: BLOOD LEFT FOREARM  Result Value Ref Range Status   Specimen Description BLOOD LEFT FOREARM  Final   Special Requests   Final    BOTTLES DRAWN AEROBIC AND ANAEROBIC Blood Culture results may not be optimal due to an inadequate volume of blood received in culture bottles   Culture   Final    NO GROWTH < 12 HOURS Performed at [facility], [address] [place], [place] [address]    Report Status PENDING  Incomplete  Blood culture (routine x 2)     Status: None (Preliminary result)   Collection Time: 08/17/23  8:07 PM   Specimen: BLOOD  Result Value Ref Range Status   Specimen Description BLOOD RIGHT ANTECUBITAL  Final   Special Requests   Final    BOTTLES DRAWN AEROBIC AND ANAEROBIC Blood Culture adequate volume   Culture   Final    NO GROWTH < 12 HOURS Performed  at [facility], [address] [place], [place] [address]    Report Status PENDING  Incomplete    Radiology Studies:  CT Angio Chest PE W and/or Wo Contrast  Result Date: 08/18/2023 CLINICAL DATA:  Pulmonary embolism (PE) suspected, high prob. Severe back pain, shortness of breath EXAM: CT ANGIOGRAPHY CHEST WITH CONTRAST TECHNIQUE: Multidetector CT imaging of the chest was performed using the standard protocol during bolus administration of intravenous contrast. Multiplanar CT image reconstructions and MIPs were obtained to evaluate the vascular anatomy. RADIATION DOSE REDUCTION: This exam was performed according to the departmental dose-optimization program which includes automated exposure control, adjustment of the mA and/or kV according to patient size and/or use of iterative reconstruction technique. CONTRAST:  75mL OMNIPAQUE IOHEXOL 350 MG/ML SOLN  COMPARISON:  03/14/2021 FINDINGS: Cardiovascular: No filling defects in the pulmonary arteries to suggest pulmonary emboli. Mild cardiomegaly. Scattered coronary artery and aortic calcifications. No evidence of aortic aneurysm. Mediastinum/Nodes: No mediastinal, hilar, or axillary adenopathy. Trachea and esophagus are unremarkable. Thyroid unremarkable. Moderate-sized hiatal hernia. Lungs/Pleura: Left lower lobe airspace opacity concerning for pneumonia. Minimal right base atelectasis. No effusions. Upper Abdomen: Imaging into the upper abdomen demonstrates no acute findings. Musculoskeletal: Left chest wall pacer in place. Leads in the right heart. Chest wall soft tissues are unremarkable. No acute bony abnormality. Review of the MIP images confirms the above findings. IMPRESSION: No evidence of pulmonary embolus. Focal opacity in the left lower lobe concerning for pneumonia. Moderate-sized hiatal hernia. Cardiomegaly, scattered coronary artery disease. Aortic Atherosclerosis (ICD10-I70.0). Electronically Signed   By: Charlett Nose M.D.   On:  08/18/2023 00:03   DG Chest 2 View  Result Date: 08/17/2023 CLINICAL DATA:  Back pain and shortness of breath. EXAM: CHEST - 2 VIEW COMPARISON:  July 31, 2023 FINDINGS: Single lead pacemaker in stable position. The cardiac silhouette is enlarged. Mediastinal contours appear intact. Patchy airspace consolidation in the left lower lobe and an element of volume loss. No definite pleural effusion. Osseous structures are without acute abnormality. Soft tissues are grossly normal. IMPRESSION: 1. Patchy airspace consolidation in the left lower lobe and an element of volume loss, concerning for pneumonia. 2. Cardiomegaly. Electronically Signed   By: Ted Mcalpine M.D.   On: 08/17/2023 16:06    Scheduled Meds:    atorvastatin  80 mg Oral Daily   carvedilol  12.5 mg Oral BID WC   dicyclomine  20 mg Oral BID   enoxaparin  80 mg Subcutaneous Q12H   gabapentin  300 mg Oral BID   insulin aspart  0-5 Units Subcutaneous QHS   insulin aspart  0-6 Units Subcutaneous TID WC   sacubitril-valsartan  1 tablet Oral BID   sodium chloride flush  3 mL Intravenous Q12H   spironolactone  12.5 mg Oral Daily   Tafamidis  1 capsule Oral Daily   warfarin  5 mg Oral ONCE-1600   Warfarin - Pharmacist Dosing Inpatient   Does not apply q1600    Continuous Infusions:    azithromycin Stopped (08/17/23 2137)   cefTRIAXone (ROCEPHIN)  IV Stopped (08/17/23 2026)     LOS: 0 days     Marcellus Scott, MD,  FACP, [facility], [facility], [facility]   [facility] [place]      To contact the attending provider between 7A-7P or the covering provider during after hours 7P-7A, please log into the web site [email or web address] and access using universal  password for that web site. If you do not have the password, please call the hospital operator.  08/18/2023, 11:49 AM

## 2023-08-18 NOTE — ED Notes (Signed)
ED TO INPATIENT HANDOFF REPORT  ED Nurse Name and Phone #: France Ravens [phone]  S Name/Age/Gender Jonathan Dickson 67 y.o. male Room/Bed: 028C/028C  Code Status   Code Status: Full Code  Home/SNF/Other Home Patient oriented to: self, place, time, and situation Is this baseline? Yes   Triage Complete: Triage complete  Chief Complaint Pneumonia [J18.9]  Triage Note Pt came to ED, pt got back surgery Thursday and today woke up with severe back pain and SHOB. Pt arrives in aspen collar. Pt took muscle relaxer and pain medication today. [id number]   Allergies Allergies  Allergen Reactions   Peanut-Containing Drug Products Swelling   Penicillins Other (See Comments)    CONVULSIONS    Decadron [Dexamethasone] Itching   Ivp Dye [Iodinated Contrast Media]     Level of Care/Admitting Diagnosis ED Disposition     ED Disposition  Admit   Condition  --   Comment  Hospital Area: [facility] [[id number]]  Level of Care: Telemetry Medical [104]  May admit patient to [facility] or [facility] if equivalent level of care is available:: No  Covid Evaluation: Asymptomatic - no recent exposure (last 10 days) testing not required  Diagnosis: Pneumonia [227785]  Admitting Physician: Briscoe Deutscher [[id number]]  Attending Physician: Briscoe Deutscher [[id number]]  Certification:: I certify there are rare and unusual circumstances requiring inpatient admission  Expected Medical Readiness: 08/21/2023          B Medical/Surgery History Past Medical History:  Diagnosis Date   Abnormal TSH 02/2016   AICD (automatic cardioverter/defibrillator) present    St Jude   Anxiety    Blood clotting disorder (HCC)    Bronchiectasis (HCC)    left lower lung   Cardiac amyloidosis (HCC)    Cervical disc disease    Chronic combined systolic and diastolic CHF (congestive heart failure) (HCC)    a. 01/2016 Echo: EF 25%, inf AK, diffuse sev HK, Gr1 DD, mild MR.   Coronary atherosclerosis     Depression    Diabetes mellitus without complication (HCC)    type 2   Dyspnea    with exertion   Dysrhythmia    Full thickness rotator cuff tear 06/2021   MRI   History of DVT (deep vein thrombosis) 08/04/2019   venous doppler US   History of hiatal hernia    Hx of adenomatous polyp of colon    Hyperglycemia    Hyperlipidemia    Hypertension    Hypertensive heart disease    Myocardial infarction (HCC)    greater than 5 yrs per patient on 08/06/23   NICM (nonischemic cardiomyopathy) (HCC)    a. 01/2016 Echo: EF 25%, inf AK, diffuse sev HK, Gr1 DD, mild MR; b. 01/2016 MV: EF 22%, no isch/infarct;  c. 02/2016 Cath: Nl cors, EF 35-45%.   Osteoarthritis of hips, bilateral    and hands   Paraseptal emphysema (HCC)    mild   Peanut allergy    Pneumonia    x 1   Pre-diabetes    hx but now DM 2   Presence of implantable cardioverter-defibrillator (ICD)    St Jude   S/P repair of ventral hernia    Seizures (HCC)    allergic with penicillin - only one seizure   Sensorineural hearing loss 11/25/2017   no hearing aids, patient denies this dx   Substance abuse (HCC)    none in 20 yrs per patient as of 08/06/23   Past Surgical History:  Procedure Laterality Date   ANTERIOR CERVICAL DECOMP/DISCECTOMY FUSION N/A 07/10/2016   Procedure: ANTERIOR CERVICAL DECOMPRESSION FUSION CERVICAL 4-5, CERVICAL 5-6, CERVICAL 6-7 WITH INSTRUMENTATION AND ALLOGRAFT;  Surgeon: Estill Bamberg, MD;  Location: [facility] OR;  Service: Orthopedics;  Laterality: N/A;   ANTERIOR CERVICAL DECOMP/DISCECTOMY FUSION N/A 04/19/2021   Procedure: ANTERIOR CERVICAL DECOMPRESSION FUSION CERVICAL 3- CERVICAL 4 WITH INSTRUMENTATION AND ALLOGRAFT;  Surgeon: Estill Bamberg, MD;  Location: [facility] OR;  Service: Orthopedics;  Laterality: N/A;   BIOPSY  07/28/2023   Procedure: BIOPSY;  Surgeon: Benancio Deeds, MD;  Location: [facility] ENDOSCOPY;  Service: Gastroenterology;;   CARDIAC CATHETERIZATION N/A 03/11/2016   Procedure: Left Heart Cath  and Coronary Angiography;  Surgeon: Marykay Lex, MD;  Location: [facility] INVASIVE CV LAB;  Service: Cardiovascular;  Laterality: N/A;   COLONOSCOPY     COLONOSCOPY WITH PROPOFOL N/A 07/28/2023   Procedure: COLONOSCOPY WITH PROPOFOL;  Surgeon: Benancio Deeds, MD;  Location: [facility] ENDOSCOPY;  Service: Gastroenterology;  Laterality: N/A;   ESOPHAGOGASTRODUODENOSCOPY (EGD) WITH PROPOFOL N/A 07/28/2023   Procedure: ESOPHAGOGASTRODUODENOSCOPY (EGD) WITH PROPOFOL;  Surgeon: Benancio Deeds, MD;  Location: [facility];  Service: Gastroenterology;  Laterality: N/A;   HOT HEMOSTASIS N/A 07/28/2023   Procedure: HOT HEMOSTASIS (ARGON PLASMA COAGULATION/BICAP);  Surgeon: Benancio Deeds, MD;  Location: [facility];  Service: Gastroenterology;  Laterality: N/A;   ICD IMPLANT N/A 06/17/2018   Procedure: ICD IMPLANT;  Surgeon: Regan Lemming, MD;  Location: [facility] INVASIVE CV LAB;  Service: Cardiovascular;  Laterality: N/A;   POLYPECTOMY  07/28/2023   Procedure: POLYPECTOMY;  Surgeon: Benancio Deeds, MD;  Location: [facility]OS[facility];  Service: Gastroenterology;;   Thumb surgery Right    VENTRAL HERNIA REPAIR N/A 10/25/2019   Procedure: PRIMARY VENTRAL HERNIA REPAIR;  Surgeon: Berna Bue, MD;  Location: [facility] ORS;  Service: General;  Laterality: N/A;     A IV Location/Drains/Wounds Patient Lines/Drains/Airways Status     Active Line/Drains/Airways     Name Placement date Placement time Site Days   Peripheral IV 08/17/23 20 G 1.88" Left;Posterior Forearm 08/17/23  1933  Forearm  1            Intake/Output Last 24 hours  Intake/Output Summary (Last 24 hours) at 08/18/2023 0052 Last data filed at 08/17/2023 2137 Gross per 24 hour  Intake 1350 ml  Output --  Net 1350 ml    Labs/Imaging Results for orders placed or performed during the hospital encounter of 08/17/23 (from the past 48 hour(s))  Basic metabolic panel     Status: Abnormal   Collection Time: 08/17/23  3:31 PM   Result Value Ref Range   Sodium 135 135 - 145 mmol/L   Potassium 3.5 3.5 - 5.1 mmol/L   Chloride 102 98 - 111 mmol/L   CO2 21 (L) 22 - 32 mmol/L   Glucose, Bld 134 (H) 70 - 99 mg/dL    Comment: Glucose reference range applies only to samples taken after fasting for at least 8 hours.   BUN 9 8 - 23 mg/dL   Creatinine, Ser 8.29 0.61 - 1.24 mg/dL   Calcium 9.1 8.9 - 56.2 mg/dL   GFR, Estimated >13 >08 mL/min    Comment: (NOTE) Calculated using the CKD-EPI Creatinine Equation (2021)    Anion gap 12 5 - 15    Comment: Performed at [facility], [address], [place], [place] [address]  CBC     Status: Abnormal   Collection Time: 08/17/23  3:31  PM  Result Value Ref Range   WBC 9.6 4.0 - 10.5 K/uL   RBC 5.36 4.22 - 5.81 MIL/uL   Hemoglobin 13.1 13.0 - 17.0 g/dL   HCT 16.1 09.6 - 04.5 %   MCV 79.3 (L) 80.0 - 100.0 fL   MCH 24.4 (L) 26.0 - 34.0 pg   MCHC 30.8 30.0 - 36.0 g/dL   RDW 40.9 (H) 81.1 - 91.4 %   Platelets 255 150 - 400 K/uL    Comment: REPEATED TO VERIFY   nRBC 0.0 0.0 - 0.2 %    Comment: Performed at [facility], [address], [place], [place] [address]  Troponin I (High Sensitivity)     Status: None   Collection Time: 08/17/23  3:31 PM  Result Value Ref Range   Troponin I (High Sensitivity) 12 <18 ng/L    Comment: (NOTE) Elevated high sensitivity troponin I (hsTnI) values and significant  changes across serial measurements may suggest ACS but many other  chronic and acute conditions are known to elevate hsTnI results.  Refer to the "Links" section for chest pain algorithms and additional  guidance. Performed at [facility], [address] [place], [place] [address]   Troponin I (High Sensitivity)     Status: None   Collection Time: 08/17/23  7:26 PM  Result Value Ref Range   Troponin I (High Sensitivity) 14 <18 ng/L    Comment: (NOTE) Elevated high sensitivity troponin I (hsTnI) values and significant  changes across serial measurements  may suggest ACS but many other  chronic and acute conditions are known to elevate hsTnI results.  Refer to the "Links" section for chest pain algorithms and additional  guidance. Performed at [facility], [address] [place], [place] [address]   Lactic acid, plasma     Status: Abnormal   Collection Time: 08/17/23  7:26 PM  Result Value Ref Range   Lactic Acid, Venous 2.1 (HH) 0.5 - 1.9 mmol/L    Comment: CRITICAL RESULT CALLED TO, READ BACK BY AND VERIFIED WITH C,CHRISCOE RN @2005  08/17/23 E,BENTON Performed at [facility], [address], [place], [place] [address]   Lactic acid, plasma     Status: None   Collection Time: 08/17/23  9:37 PM  Result Value Ref Range   Lactic Acid, Venous 1.9 0.5 - 1.9 mmol/L    Comment: Performed at [facility], [address], [place], [place] [address]   CT Angio Chest PE W and/or Wo Contrast  Result Date: 08/18/2023 CLINICAL DATA:  Pulmonary embolism (PE) suspected, high prob. Severe back pain, shortness of breath EXAM: CT ANGIOGRAPHY CHEST WITH CONTRAST TECHNIQUE: Multidetector CT imaging of the chest was performed using the standard protocol during bolus administration of intravenous contrast. Multiplanar CT image reconstructions and MIPs were obtained to evaluate the vascular anatomy. RADIATION DOSE REDUCTION: This exam was performed according to the departmental dose-optimization program which includes automated exposure control, adjustment of the mA and/or kV according to patient size and/or use of iterative reconstruction technique. CONTRAST:  75mL OMNIPAQUE IOHEXOL 350 MG/ML SOLN COMPARISON:  03/14/2021 FINDINGS: Cardiovascular: No filling defects in the pulmonary arteries to suggest pulmonary emboli. Mild cardiomegaly. Scattered coronary artery and aortic calcifications. No evidence of aortic aneurysm. Mediastinum/Nodes: No mediastinal, hilar, or axillary adenopathy. Trachea and esophagus are unremarkable. Thyroid  unremarkable. Moderate-sized hiatal hernia. Lungs/Pleura: Left lower lobe airspace opacity concerning for pneumonia. Minimal right base atelectasis. No effusions. Upper Abdomen: Imaging into  the upper abdomen demonstrates no acute findings. Musculoskeletal: Left chest wall pacer in place. Leads in the right heart. Chest wall soft tissues are unremarkable. No acute bony abnormality. Review of the MIP images confirms the above findings. IMPRESSION: No evidence of pulmonary embolus. Focal opacity in the left lower lobe concerning for pneumonia. Moderate-sized hiatal hernia. Cardiomegaly, scattered coronary artery disease. Aortic Atherosclerosis (ICD10-I70.0). Electronically Signed   By: Charlett Nose M.D.   On: 08/18/2023 00:03   DG Chest 2 View  Result Date: 08/17/2023 CLINICAL DATA:  Back pain and shortness of breath. EXAM: CHEST - 2 VIEW COMPARISON:  July 31, 2023 FINDINGS: Single lead pacemaker in stable position. The cardiac silhouette is enlarged. Mediastinal contours appear intact. Patchy airspace consolidation in the left lower lobe and an element of volume loss. No definite pleural effusion. Osseous structures are without acute abnormality. Soft tissues are grossly normal. IMPRESSION: 1. Patchy airspace consolidation in the left lower lobe and an element of volume loss, concerning for pneumonia. 2. Cardiomegaly. Electronically Signed   By: Ted Mcalpine M.D.   On: 08/17/2023 16:06    Pending Labs Unresulted Labs (From admission, onward)     Start     Ordered   08/18/23 0500  Protime-INR  Once-Timed,   STAT       Question:  Specimen collection method  Answer:  IV Team=IV Team collect   08/17/23 2205   08/18/23 0500  Hemoglobin A1c  Tomorrow morning,   R       Comments: To assess prior glycemic control   Question:  Specimen collection method  Answer:  IV Team=IV Team collect   08/18/23 0048   08/18/23 0500  HIV Antibody (routine testing w rflx)  (HIV Antibody (Routine testing w reflex)  panel)  Tomorrow morning,   R       Question:  Specimen collection method  Answer:  IV Team=IV Team collect   08/18/23 0048   08/18/23 0500  Basic metabolic panel  Daily,   R     Question:  Specimen collection method  Answer:  IV Team=IV Team collect   08/18/23 0048   08/18/23 0500  Magnesium  Tomorrow morning,   R       Question:  Specimen collection method  Answer:  IV Team=IV Team collect   08/18/23 0048   08/18/23 0500  CBC  Daily,   R     Question:  Specimen collection method  Answer:  IV Team=IV Team collect   08/18/23 0048   08/18/23 0048  Legionella Pneumophila Serogp 1 Ur Ag  (COPD / Pneumonia / Cellulitis / Lower Extremity Wound)  Once,   R        08/18/23 0048   08/18/23 0048  Strep pneumoniae urinary antigen  (COPD / Pneumonia / Cellulitis / Lower Extremity Wound)  Once,   R        08/18/23 0048   08/17/23 1756  Blood culture (routine x 2)  BLOOD CULTURE X 2,   R (with STAT occurrences)      08/17/23 1755            Vitals/Pain Today's Vitals   08/17/23 2215 08/17/23 2242 08/17/23 2311 08/18/23 0000  BP: (!) 145/102  (!) 157/103 (!) 135/95  Pulse: (!) 109  (!) 113 (!) 113  Resp: (!) 32  (!) 22 (!) 29  Temp:   97.7 F (36.5 C)   TempSrc:   Oral   SpO2: 95% 96% 98% 95%  Weight:  Height:      PainSc:        Isolation Precautions No active isolations  Medications Medications  cefTRIAXone (ROCEPHIN) 2 g in sodium chloride 0.9 % 100 mL IVPB (0 g Intravenous Stopped 08/17/23 2026)  azithromycin (ZITHROMAX) 500 mg in dextrose 5 % 250 mL IVPB (0 mg Intravenous Stopped 08/17/23 2137)  atorvastatin (LIPITOR) tablet 80 mg (has no administration in time range)  carvedilol (COREG) tablet 12.5 mg (has no administration in time range)  sacubitril-valsartan (ENTRESTO) 49-51 mg per tablet (has no administration in time range)  spironolactone (ALDACTONE) tablet 12.5 mg (has no administration in time range)  Tafamidis CAPS 61 mg (has no administration in time range)   dicyclomine (BENTYL) tablet 20 mg (has no administration in time range)  enoxaparin (LOVENOX) injection 80 mg (has no administration in time range)  gabapentin (NEURONTIN) capsule 300 mg (has no administration in time range)  methocarbamol (ROBAXIN) tablet 500-1,000 mg (has no administration in time range)  albuterol (PROVENTIL) (2.5 MG/3ML) 0.083% nebulizer solution 2.5 mg (has no administration in time range)  insulin aspart (novoLOG) injection 0-6 Units (has no administration in time range)  insulin aspart (novoLOG) injection 0-5 Units (has no administration in time range)  sodium chloride flush (NS) 0.9 % injection 3 mL (has no administration in time range)  acetaminophen (TYLENOL) tablet 650 mg (has no administration in time range)    Or  acetaminophen (TYLENOL) suppository 650 mg (has no administration in time range)  oxyCODONE (Oxy IR/ROXICODONE) immediate release tablet 5 mg (has no administration in time range)  HYDROmorphone (DILAUDID) injection 0.5 mg (has no administration in time range)  polyethylene glycol (MIRALAX / GLYCOLAX) packet 17 g (has no administration in time range)  ondansetron (ZOFRAN) tablet 4 mg (has no administration in time range)    Or  ondansetron (ZOFRAN) injection 4 mg (has no administration in time range)  guaiFENesin (ROBITUSSIN) 100 MG/5ML liquid 5 mL (has no administration in time range)  sodium chloride 0.9 % bolus 500 mL (0 mLs Intravenous Stopped 08/17/23 2031)  methylPREDNISolone sodium succinate (SOLU-MEDROL) 40 mg/mL injection 40 mg (40 mg Intravenous Given 08/17/23 1934)  diphenhydrAMINE (BENADRYL) capsule 50 mg (50 mg Oral Given 08/17/23 2246)    Or  diphenhydrAMINE (BENADRYL) injection 50 mg ( Intravenous See Alternative 08/17/23 2246)  lactated ringers bolus 500 mL (0 mLs Intravenous Stopped 08/17/23 2131)  morphine (PF) 4 MG/ML injection 4 mg (4 mg Intravenous Given 08/17/23 1935)  enoxaparin (LOVENOX) injection 80 mg (80 mg Subcutaneous  Given 08/17/23 2034)  ipratropium (ATROVENT) nebulizer solution 0.5 mg (0.5 mg Nebulization Given 08/17/23 2241)  iohexol (OMNIPAQUE) 350 MG/ML injection 75 mL (75 mLs Intravenous Contrast Given 08/17/23 2358)  sodium chloride 0.9 % bolus 1,000 mL (1,000 mLs Intravenous New Bag/Given 08/18/23 0026)    Mobility walks with person assist     Focused Assessments    R Recommendations: See Admitting Provider Note  Report given to:   Additional Notes:  Pt has c-collar on post surgery

## 2023-08-18 NOTE — Progress Notes (Signed)
PHARMACY - ANTICOAGULATION CONSULT NOTE  Pharmacy Consult for Coumadin Indication: h/o PE/DVT  Allergies  Allergen Reactions   Peanut-Containing Drug Products Swelling   Penicillins Other (See Comments)    CONVULSIONS    Decadron [Dexamethasone] Itching   Ivp Dye [Iodinated Contrast Media]     Patient Measurements: Height: 5\' 6"  (167.6 cm) Weight: 73 kg (160 lb 15 oz) IBW/kg (Calculated) : 63.8  Vital Signs: Temp: 98.6 F (37 C) (11/18 1526) Temp Source: Oral (11/18 1526) BP: 128/76 (11/18 1526) Pulse Rate: 89 (11/18 1526)  Labs: Recent Labs    08/17/23 1531 08/17/23 1926 08/18/23 0646  HGB 13.1  --  12.7*  HCT 42.5  --  41.2  PLT 255  --  207  LABPROT  --   --  17.5*  INR  --   --  1.4*  CREATININE 0.97  --  0.99  TROPONINIHS 12 14  --     Estimated Creatinine Clearance: 64.4 mL/min (by C-G formula based on SCr of 0.99 mg/dL).   Medical History: Past Medical History:  Diagnosis Date   Abnormal TSH 02/2016   AICD (automatic cardioverter/defibrillator) present    St Jude   Anxiety    Blood clotting disorder (HCC)    Bronchiectasis (HCC)    left lower lung   Cardiac amyloidosis (HCC)    Cervical disc disease    Chronic combined systolic and diastolic CHF (congestive heart failure) (HCC)    a. 01/2016 Echo: EF 25%, inf AK, diffuse sev HK, Gr1 DD, mild MR.   Coronary atherosclerosis    Depression    Diabetes mellitus without complication (HCC)    type 2   Dyspnea    with exertion   Dysrhythmia    Full thickness rotator cuff tear 06/2021   MRI   History of DVT (deep vein thrombosis) 08/04/2019   venous doppler US   History of hiatal hernia    Hx of adenomatous polyp of colon    Hyperglycemia    Hyperlipidemia    Hypertension    Hypertensive heart disease    Myocardial infarction (HCC)    greater than 5 yrs per patient on 08/06/23   NICM (nonischemic cardiomyopathy) (HCC)    a. 01/2016 Echo: EF 25%, inf AK, diffuse sev HK, Gr1 DD, mild MR; b.  01/2016 MV: EF 22%, no isch/infarct;  c. 02/2016 Cath: Nl cors, EF 35-45%.   Osteoarthritis of hips, bilateral    and hands   Paraseptal emphysema (HCC)    mild   Peanut allergy    Pneumonia    x 1   Pre-diabetes    hx but now DM 2   Presence of implantable cardioverter-defibrillator (ICD)    St Jude   S/P repair of ventral hernia    Seizures (HCC)    allergic with penicillin - only one seizure   Sensorineural hearing loss 11/25/2017   no hearing aids, patient denies this dx   Substance abuse (HCC)    none in 20 yrs per patient as of 08/06/23    Medications:  No current facility-administered medications on file prior to encounter.   Current Outpatient Medications on File Prior to Encounter  Medication Sig Dispense Refill   acetaminophen (TYLENOL) 500 MG tablet Take 1,000 mg by mouth every 6 (six) hours as needed for moderate pain or headache.     albuterol (VENTOLIN HFA) 108 (90 Base) MCG/ACT inhaler INHALE 2 PUFFS INTO THE LUNGS EVERY 6 HOURS AS NEEDED FOR WHEEZING OR SHORTNESS  OF BREATH 6.7 g 3   Aromatic Inhalants (VICKS VAPOINHALER) INHA Inhale 1 Dose into the lungs daily as needed (congestion).     atorvastatin (LIPITOR) 80 MG tablet Take 1 tablet (80 mg total) by mouth daily. 90 tablet 3   carvedilol (COREG) 12.5 MG tablet Take 1 tablet (12.5 mg total) by mouth 2 (two) times daily with a meal. NEEDS FOLLOW UP APPOINTMENT FOR MORE REFILLS 180 tablet 0   Chlorphen-Phenyleph-ASA (ALKA-SELTZER PLUS COLD PO) Take 1-2 tablets by mouth 2 (two) times daily as needed (cold symptoms).      cholecalciferol (VITAMIN D3) 25 MCG (1000 UNIT) tablet Take 1,000 Units by mouth daily.     dapagliflozin propanediol (FARXIGA) 10 MG TABS tablet Take 1 tablet (10 mg total) by mouth daily. 30 tablet 6   dicyclomine (BENTYL) 20 MG tablet Take 1 tablet (20 mg total) by mouth 2 (two) times daily. 10 tablet 0   enoxaparin (LOVENOX) 80 MG/0.8ML injection Inject 80 mg into the skin every 12 (twelve) hours.      fluticasone (FLONASE) 50 MCG/ACT nasal spray SHAKE LIQUID AND USE 1 SPRAY IN EACH NOSTRIL DAILY (Patient taking differently: Place 1 spray into both nostrils daily.) 16 g 1   gabapentin (NEURONTIN) 300 MG capsule Take 300 mg by mouth daily as needed.     HYDROcodone-acetaminophen (NORCO/VICODIN) 5-325 MG tablet Take 1 tablet by mouth every 6 (six) hours as needed for moderate pain (pain score 4-6) or severe pain (pain score 7-10). 20 tablet 0   metFORMIN (GLUCOPHAGE) 500 MG tablet TAKE 1 TABLET(500 MG) BY MOUTH DAILY WITH BREAKFAST (Patient taking differently: Take 500 mg by mouth daily with breakfast.) 90 tablet 0   methocarbamol (ROBAXIN) 500 MG tablet Take 1-2 tablets (500-1,000 mg total) by mouth every 8 (eight) hours as needed for muscle spasms. 30 tablet 0   sacubitril-valsartan (ENTRESTO) 49-51 MG Take 1 tablet by mouth 2 (two) times daily. NEEDS FOLLOW UP APPOINTMENT FOR MORE REFILLS 180 tablet 0   spironolactone (ALDACTONE) 25 MG tablet Take 0.5 tablets (12.5 mg total) by mouth daily. TAKE 1/2 TABLET(12.5 MG) BY MOUTH DAILY 15 tablet 11   Tafamidis (VYNDAMAX) 61 MG CAPS Take 1 capsule by mouth daily. 30 capsule 11   warfarin (COUMADIN) 5 MG tablet Take 5 mg by mouth See admin instructions. Patient takes 2 tablets on Monday, Wednesday, and Friday, and 1 tablet on all other days.     Misc. Devices MISC by Does not apply route. Flutter valve daily       Assessment: 67 y.o. male admitted with PNA s/p recent back surgery, for Coumadin.  Currently receiving Lovenox 80 mg BID as bridge until INR > 2. Hgb slightly down to 12.7, pltc wnl.  No bleeding noted.  INR is 1.4 today.  S/p recent surgery on 08/14/23 during prior hospitalization> Warfarin recently resumed post op.  PTA warf dose: 10mg  qMWF and 5mg  qTTSS, LD taken 11/17  Potentinal DDI with Azithromycin x 5 days   Goal of Therapy:  INR 2-3 Monitor platelets by anticoagulation protocol: Yes   Plan:  Warfarin 5 mg po today  x1 Daily INR Discontinue Enoxaparin when INR >2. Monitor for signs and symptoms of bleeding   Thank you for allowing pharmacy to be part of this patients care team.   Noah Delaine, RPh Clinical Pharmacist Please check AMION for all [facility] Pharmacy phone numbers After 10:00 PM, call Main Pharmacy [phone]  08/18/2023,4:04 PM

## 2023-08-18 NOTE — Progress Notes (Signed)
PHARMACY - ANTICOAGULATION CONSULT NOTE  Pharmacy Consult for Coumadin Indication: h/o PE/DVT  Allergies  Allergen Reactions   Peanut-Containing Drug Products Swelling   Penicillins Other (See Comments)    CONVULSIONS    Decadron [Dexamethasone] Itching   Ivp Dye [Iodinated Contrast Media]     Patient Measurements: Height: 5\' 6"  (167.6 cm) Weight: 73 kg (160 lb 15 oz) IBW/kg (Calculated) : 63.8  Vital Signs: Temp: 98.5 F (36.9 C) (11/18 0229) Temp Source: Oral (11/18 0229) BP: 157/103 (11/18 0229) Pulse Rate: 102 (11/18 0229)  Labs: Recent Labs    08/17/23 1531 08/17/23 1926  HGB 13.1  --   HCT 42.5  --   PLT 255  --   CREATININE 0.97  --   TROPONINIHS 12 14    Estimated Creatinine Clearance: 65.8 mL/min (by C-G formula based on SCr of 0.97 mg/dL).   Medical History: Past Medical History:  Diagnosis Date   Abnormal TSH 02/2016   AICD (automatic cardioverter/defibrillator) present    St Jude   Anxiety    Blood clotting disorder (HCC)    Bronchiectasis (HCC)    left lower lung   Cardiac amyloidosis (HCC)    Cervical disc disease    Chronic combined systolic and diastolic CHF (congestive heart failure) (HCC)    a. 01/2016 Echo: EF 25%, inf AK, diffuse sev HK, Gr1 DD, mild MR.   Coronary atherosclerosis    Depression    Diabetes mellitus without complication (HCC)    type 2   Dyspnea    with exertion   Dysrhythmia    Full thickness rotator cuff tear 06/2021   MRI   History of DVT (deep vein thrombosis) 08/04/2019   venous doppler US   History of hiatal hernia    Hx of adenomatous polyp of colon    Hyperglycemia    Hyperlipidemia    Hypertension    Hypertensive heart disease    Myocardial infarction (HCC)    greater than 5 yrs per patient on 08/06/23   NICM (nonischemic cardiomyopathy) (HCC)    a. 01/2016 Echo: EF 25%, inf AK, diffuse sev HK, Gr1 DD, mild MR; b. 01/2016 MV: EF 22%, no isch/infarct;  c. 02/2016 Cath: Nl cors, EF 35-45%.    Osteoarthritis of hips, bilateral    and hands   Paraseptal emphysema (HCC)    mild   Peanut allergy    Pneumonia    x 1   Pre-diabetes    hx but now DM 2   Presence of implantable cardioverter-defibrillator (ICD)    St Jude   S/P repair of ventral hernia    Seizures (HCC)    allergic with penicillin - only one seizure   Sensorineural hearing loss 11/25/2017   no hearing aids, patient denies this dx   Substance abuse (HCC)    none in 20 yrs per patient as of 08/06/23    Medications:  No current facility-administered medications on file prior to encounter.   Current Outpatient Medications on File Prior to Encounter  Medication Sig Dispense Refill   acetaminophen (TYLENOL) 500 MG tablet Take 1,000 mg by mouth every 6 (six) hours as needed for moderate pain or headache.     albuterol (VENTOLIN HFA) 108 (90 Base) MCG/ACT inhaler INHALE 2 PUFFS INTO THE LUNGS EVERY 6 HOURS AS NEEDED FOR WHEEZING OR SHORTNESS OF BREATH 6.7 g 3   Aromatic Inhalants (VICKS VAPOINHALER) INHA Inhale 1 Dose into the lungs daily as needed (congestion).     atorvastatin (  LIPITOR) 80 MG tablet Take 1 tablet (80 mg total) by mouth daily. 90 tablet 3   carvedilol (COREG) 12.5 MG tablet Take 1 tablet (12.5 mg total) by mouth 2 (two) times daily with a meal. NEEDS FOLLOW UP APPOINTMENT FOR MORE REFILLS 180 tablet 0   Chlorphen-Phenyleph-ASA (ALKA-SELTZER PLUS COLD PO) Take 1-2 tablets by mouth 2 (two) times daily as needed (cold symptoms).      cholecalciferol (VITAMIN D3) 25 MCG (1000 UNIT) tablet Take 1,000 Units by mouth daily.     dapagliflozin propanediol (FARXIGA) 10 MG TABS tablet Take 1 tablet (10 mg total) by mouth daily. 30 tablet 6   dicyclomine (BENTYL) 20 MG tablet Take 1 tablet (20 mg total) by mouth 2 (two) times daily. 10 tablet 0   enoxaparin (LOVENOX) 80 MG/0.8ML injection Inject 80 mg into the skin every 12 (twelve) hours.     fluticasone (FLONASE) 50 MCG/ACT nasal spray SHAKE LIQUID AND USE 1  SPRAY IN EACH NOSTRIL DAILY (Patient taking differently: Place 1 spray into both nostrils daily.) 16 g 1   gabapentin (NEURONTIN) 300 MG capsule Take 300 mg by mouth daily as needed.     HYDROcodone-acetaminophen (NORCO/VICODIN) 5-325 MG tablet Take 1 tablet by mouth every 6 (six) hours as needed for moderate pain (pain score 4-6) or severe pain (pain score 7-10). 20 tablet 0   metFORMIN (GLUCOPHAGE) 500 MG tablet TAKE 1 TABLET(500 MG) BY MOUTH DAILY WITH BREAKFAST (Patient taking differently: Take 500 mg by mouth daily with breakfast.) 90 tablet 0   methocarbamol (ROBAXIN) 500 MG tablet Take 1-2 tablets (500-1,000 mg total) by mouth every 8 (eight) hours as needed for muscle spasms. 30 tablet 0   sacubitril-valsartan (ENTRESTO) 49-51 MG Take 1 tablet by mouth 2 (two) times daily. NEEDS FOLLOW UP APPOINTMENT FOR MORE REFILLS 180 tablet 0   spironolactone (ALDACTONE) 25 MG tablet Take 0.5 tablets (12.5 mg total) by mouth daily. TAKE 1/2 TABLET(12.5 MG) BY MOUTH DAILY 15 tablet 11   Tafamidis (VYNDAMAX) 61 MG CAPS Take 1 capsule by mouth daily. 30 capsule 11   warfarin (COUMADIN) 5 MG tablet Take 5 mg by mouth See admin instructions. Patient takes 2 tablets on Monday, Wednesday, and Friday, and 1 tablet on all other days.     Misc. Devices MISC by Does not apply route. Flutter valve daily       Assessment: 67 y.o. male admitted with PNA s/p recent back surgery, for Coumadin.  Currently receiving Lovenox 80 mg BID as bridge until INR > 2.  Goal of Therapy:  INR 2-3 Monitor platelets by anticoagulation protocol: Yes   Plan:  F/U daily INR  Eddie Candle 08/18/2023,6:33 AM

## 2023-08-19 DIAGNOSIS — J189 Pneumonia, unspecified organism: Secondary | ICD-10-CM | POA: Diagnosis not present

## 2023-08-19 LAB — PROTIME-INR
INR: 1.4 — ABNORMAL HIGH (ref 0.8–1.2)
Prothrombin Time: 17 s — ABNORMAL HIGH (ref 11.4–15.2)

## 2023-08-19 LAB — GLUCOSE, CAPILLARY
Glucose-Capillary: 120 mg/dL — ABNORMAL HIGH (ref 70–99)
Glucose-Capillary: 137 mg/dL — ABNORMAL HIGH (ref 70–99)

## 2023-08-19 MED ORDER — CEFADROXIL 500 MG PO CAPS: 500.0000 mg | ORAL_CAPSULE | Freq: Two times a day (BID) | ORAL | 0 refills | Status: DC

## 2023-08-19 MED ORDER — CEFDINIR 300 MG PO CAPS: 300.0000 mg | ORAL_CAPSULE | Freq: Two times a day (BID) | ORAL | 0 refills | Status: AC

## 2023-08-19 MED ORDER — ACETAMINOPHEN 325 MG PO TABS: 650.0000 mg | ORAL_TABLET | Freq: Four times a day (QID) | ORAL | Status: AC | PRN

## 2023-08-19 MED ORDER — WARFARIN SODIUM 5 MG PO TABS: 5.0000 mg | ORAL_TABLET | Freq: Once | ORAL | Status: DC

## 2023-08-19 MED ORDER — AZITHROMYCIN 500 MG PO TABS: 500.0000 mg | ORAL_TABLET | Freq: Once | ORAL | 0 refills | Status: AC

## 2023-08-19 NOTE — TOC Transition Note (Signed)
Transition of Care [facility]) - CM/SW Discharge Note   Patient Details  Name: Jonathan Dickson MRN: [id number] Date of Birth: [birth year]  Transition of Care [facility]) CM/SW Contact:  Epifanio Lesches, RN Phone Number: 08/19/2023, 11:49 AM   Clinical Narrative:    Patient will DC to: home Anticipated DC date: 08/19/2023 Family notified: yes Transport by: car   Pt with pneumonia. Hx s/p recent anterior cervical decompression and fusion on 08/14/2023,  h/o PE/DVT( coumadin).  Per MD patient ready for DC today. RN, patient, and patient's sister notified of DC plan.  Pt without home health and DME needs.  Post hospital f/u noted on AVS. Pt without RX med concerns. Sister to provide transportation to home.  RNCM will sign off for now as intervention is no longer needed. Please consult [place] again if new needs arise.      Barriers to Discharge: No Barriers Identified   Patient Goals and CMS Choice      Discharge Placement                         Discharge Plan and Services Additional resources added to the After Visit Summary for                                       Social Determinants of Health (SDOH) Interventions SDOH Screenings   Food Insecurity: No Food Insecurity (08/18/2023)  Housing: Patient Declined (08/18/2023)  Transportation Needs: No Transportation Needs (08/18/2023)  Utilities: Patient Declined (08/18/2023)  Alcohol Screen: Low Risk  (05/06/2023)  Depression (PHQ2-9): Low Risk  (07/24/2023)  Financial Resource Strain: Low Risk  (05/06/2023)  Physical Activity: Insufficiently Active (05/06/2023)  Social Connections: Moderately Isolated (05/06/2023)  Stress: No Stress Concern Present (05/06/2023)  Tobacco Use: Medium Risk (08/18/2023)  Health Literacy: Adequate Health Literacy (05/06/2023)     Readmission Risk Interventions     No data to display

## 2023-08-19 NOTE — Progress Notes (Signed)
PHARMACY - ANTICOAGULATION CONSULT NOTE  Pharmacy Consult for Coumadin Indication: h/o PE/DVT  Allergies  Allergen Reactions   Peanut-Containing Drug Products Swelling   Penicillins Other (See Comments)    CONVULSIONS    Decadron [Dexamethasone] Itching   Ivp Dye [Iodinated Contrast Media]     Patient Measurements: Height: 5\' 6"  (167.6 cm) Weight: 73 kg (160 lb 15 oz) IBW/kg (Calculated) : 63.8  Vital Signs: Temp: 97.7 F (36.5 C) (11/19 0728) Temp Source: Oral (11/19 0728) BP: 139/97 (11/19 0728) Pulse Rate: 87 (11/19 0728)  Labs: Recent Labs    08/17/23 1531 08/17/23 1926 08/18/23 0646 08/19/23 0744  HGB 13.1  --  12.7*  --   HCT 42.5  --  41.2  --   PLT 255  --  207  --   LABPROT  --   --  17.5* 17.0*  INR  --   --  1.4* 1.4*  CREATININE 0.97  --  0.99  --   TROPONINIHS 12 14  --   --     Estimated Creatinine Clearance: 64.4 mL/min (by C-G formula based on SCr of 0.99 mg/dL).   Medical History: Past Medical History:  Diagnosis Date   Abnormal TSH 02/2016   AICD (automatic cardioverter/defibrillator) present    St Jude   Anxiety    Blood clotting disorder (HCC)    Bronchiectasis (HCC)    left lower lung   Cardiac amyloidosis (HCC)    Cervical disc disease    Chronic combined systolic and diastolic CHF (congestive heart failure) (HCC)    a. 01/2016 Echo: EF 25%, inf AK, diffuse sev HK, Gr1 DD, mild MR.   Coronary atherosclerosis    Depression    Diabetes mellitus without complication (HCC)    type 2   Dyspnea    with exertion   Dysrhythmia    Full thickness rotator cuff tear 06/2021   MRI   History of DVT (deep vein thrombosis) 08/04/2019   venous doppler US   History of hiatal hernia    Hx of adenomatous polyp of colon    Hyperglycemia    Hyperlipidemia    Hypertension    Hypertensive heart disease    Myocardial infarction (HCC)    greater than 5 yrs per patient on 08/06/23   NICM (nonischemic cardiomyopathy) (HCC)    a. 01/2016 Echo: EF  25%, inf AK, diffuse sev HK, Gr1 DD, mild MR; b. 01/2016 MV: EF 22%, no isch/infarct;  c. 02/2016 Cath: Nl cors, EF 35-45%.   Osteoarthritis of hips, bilateral    and hands   Paraseptal emphysema (HCC)    mild   Peanut allergy    Pneumonia    x 1   Pre-diabetes    hx but now DM 2   Presence of implantable cardioverter-defibrillator (ICD)    St Jude   S/P repair of ventral hernia    Seizures (HCC)    allergic with penicillin - only one seizure   Sensorineural hearing loss 11/25/2017   no hearing aids, patient denies this dx   Substance abuse (HCC)    none in 20 yrs per patient as of 08/06/23    Medications:  No current facility-administered medications on file prior to encounter.   Current Outpatient Medications on File Prior to Encounter  Medication Sig Dispense Refill   acetaminophen (TYLENOL) 500 MG tablet Take 1,000 mg by mouth every 6 (six) hours as needed for moderate pain or headache.     albuterol (VENTOLIN HFA) 108 (  90 Base) MCG/ACT inhaler INHALE 2 PUFFS INTO THE LUNGS EVERY 6 HOURS AS NEEDED FOR WHEEZING OR SHORTNESS OF BREATH 6.7 g 3   Aromatic Inhalants (VICKS VAPOINHALER) INHA Inhale 1 Dose into the lungs daily as needed (congestion).     atorvastatin (LIPITOR) 80 MG tablet Take 1 tablet (80 mg total) by mouth daily. 90 tablet 3   carvedilol (COREG) 12.5 MG tablet Take 1 tablet (12.5 mg total) by mouth 2 (two) times daily with a meal. NEEDS FOLLOW UP APPOINTMENT FOR MORE REFILLS 180 tablet 0   Chlorphen-Phenyleph-ASA (ALKA-SELTZER PLUS COLD PO) Take 1-2 tablets by mouth 2 (two) times daily as needed (cold symptoms).      cholecalciferol (VITAMIN D3) 25 MCG (1000 UNIT) tablet Take 1,000 Units by mouth daily.     dapagliflozin propanediol (FARXIGA) 10 MG TABS tablet Take 1 tablet (10 mg total) by mouth daily. 30 tablet 6   dicyclomine (BENTYL) 20 MG tablet Take 1 tablet (20 mg total) by mouth 2 (two) times daily. 10 tablet 0   enoxaparin (LOVENOX) 80 MG/0.8ML injection  Inject 80 mg into the skin every 12 (twelve) hours.     fluticasone (FLONASE) 50 MCG/ACT nasal spray SHAKE LIQUID AND USE 1 SPRAY IN EACH NOSTRIL DAILY (Patient taking differently: Place 1 spray into both nostrils daily.) 16 g 1   gabapentin (NEURONTIN) 300 MG capsule Take 300 mg by mouth daily as needed.     HYDROcodone-acetaminophen (NORCO/VICODIN) 5-325 MG tablet Take 1 tablet by mouth every 6 (six) hours as needed for moderate pain (pain score 4-6) or severe pain (pain score 7-10). 20 tablet 0   metFORMIN (GLUCOPHAGE) 500 MG tablet TAKE 1 TABLET(500 MG) BY MOUTH DAILY WITH BREAKFAST (Patient taking differently: Take 500 mg by mouth daily with breakfast.) 90 tablet 0   methocarbamol (ROBAXIN) 500 MG tablet Take 1-2 tablets (500-1,000 mg total) by mouth every 8 (eight) hours as needed for muscle spasms. 30 tablet 0   sacubitril-valsartan (ENTRESTO) 49-51 MG Take 1 tablet by mouth 2 (two) times daily. NEEDS FOLLOW UP APPOINTMENT FOR MORE REFILLS 180 tablet 0   spironolactone (ALDACTONE) 25 MG tablet Take 0.5 tablets (12.5 mg total) by mouth daily. TAKE 1/2 TABLET(12.5 MG) BY MOUTH DAILY 15 tablet 11   Tafamidis (VYNDAMAX) 61 MG CAPS Take 1 capsule by mouth daily. 30 capsule 11   warfarin (COUMADIN) 5 MG tablet Take 5 mg by mouth See admin instructions. Patient takes 2 tablets on Monday, Wednesday, and Friday, and 1 tablet on all other days.     Misc. Devices MISC by Does not apply route. Flutter valve daily       Assessment: 67 y.o. male admitted with PNA s/p recent back surgery, for Coumadin.  Currently receiving Lovenox 80 mg BID as bridge until INR > 2. Hgb slightly down to 12.7, pltc wnl on 11/18.  No bleeding noted.  INR is 1.4 today.  S/p recent surgery on 08/14/23 during prior hospitalization> Warfarin recently resumed post op.  PTA warf dose: 10mg  qMWF and 5mg  qTTSS, LD taken 11/17  Potentinal DDI with Azithromycin x 5 days  thru 08/21/23.  Warfarin dose adjusted due to potential drug  interaction with Azithromycin, which can increase warfarin effect. Thus warfarin dose reduced yesterday 11/18.    Goal of Therapy:  INR 2-3 Monitor platelets by anticoagulation protocol: Yes   Plan:  Warfarin 5 mg po today x1.  Daily INR Continue Enoxaparin  80mg  sq q12h. Discontinue Enoxaparin when INR >2. Monitor for  signs and symptoms of bleeding CBC every 72 hours.    Thank you for allowing pharmacy to be part of this patients care team.   Noah Delaine, RPh Clinical Pharmacist Please check AMION for all [facility] phone numbers After 10:00 PM, call Main Pharmacy [phone]  08/19/2023,1:04 PM

## 2023-08-19 NOTE — Discharge Instructions (Signed)

## 2023-08-19 NOTE — Discharge Summary (Signed)
Physician Discharge Summary  TELLY COREAS ZOX:[id number] DOB: Jun 12, 1956  PCP: Claiborne Rigg, NP  Admitted from: Home Discharged to: Home  Admit date: 08/17/2023 Discharge date: 08/19/2023  Recommendations for Outpatient Follow-up:    Follow-up Information     Claiborne Rigg, NP. Schedule an appointment as soon as possible for a visit in 1 week(s).   Specialty: Nurse Practitioner Why: To be seen with repeat labs (CBC & BMP) Contact information: [address] [place] [address] [phone]         [facility] at [facility] Follow up on 08/20/2023.   Why: 9:45 am.  Coumadin management. Contact information: [facility] at [facility] [address] [place] [address] [phone]        Estill Bamberg, MD. Call.   Specialty: Orthopedic Surgery Why: Follow-up on the upcoming appointment that you have or call the office for changes. Contact information: [address] [place] [address] [phone]                  Home Health: None    Equipment/Devices: None    Discharge Condition: Improved and stable.    Code Status: Full Code Diet recommendation:  Discharge Diet Orders (From admission, onward)     Start     Ordered   08/19/23 0000  Diet - low sodium heart healthy        08/19/23 1423   08/19/23 0000  Diet Carb Modified        08/19/23 1423             Discharge Diagnoses:  Principal Problem:   Pneumonia Active Problems:   Cervical disc disease with myelopathy   Chronic systolic heart failure (HCC)   Cardiac amyloidosis (HCC)   Hx of pulmonary embolus   Brief Summary: 67 y.o. male with medical history significant for history of DVT and PE, cardiac amyloidosis, chronic systolic CHF, type II DM, HTN, and cervical spinal stenosis with radiculopathy status post ACDF on 1114, now presenting with severe neck and shoulder pain, productive cough, and  shortness of breath.  In ED, tachypneic and tachycardic.  CTA chest negative for PE but concerning for left lower lobe pneumonia.     Assessment & Plan:    Lobar pneumonia Presented with productive cough, dyspnea, had tachypnea and tachycardia on arrival CTA chest without PE but showed focal opacity in the left lower lobe concerning for pneumonia. Continue empiric IV ceftriaxone and azithromycin. Lactate 2.1 > 1.9.  At bedtime troponin x 1 negative. Urinary strep pneumo antigen negative.  Blood cultures x 2: Negative to date. Improved, symptom wise and no longer tachypneic or tachycardic. Completed 2 days of IV antibiotics. Discharged on a dose of Azithromycin to complete 3 days and Cefdinir to complete total 5 days. Urine Legionella antigen is pending and can be followed by PCP as outpatient Recommend repeating chest x-ray in 4 weeks to ensure resolution of pneumonia findings.   Neck pain; s/p anterior cervical decompression and fusion on 08/14/23   - ED PA discussed with ortho PA who did not feel the pain was related to any worrisome postoperative complication and advised against imaging  - Pt is neurologically intact  - Continue pain-control, continue orthopedic surgery follow-up as planned. Appt 11/29? -Feels much better.  Has cervical collar in place.   Chronic HFrEF; cardiac amyloidosis  - EF was 30-35% in August 2022; he has ICD  - Appears compensated   -  Continue Aldactone, Coreg, Entresto, and tafamidis     Hx of PE  - No PE on CTA in ED today  - Continue warfarin with Lovenox bridge.  INR 1.4. - Patient is well aware of managing Lovenox bridging and Warfarin from prior experiences during procedures/surgeries. Also has a Coumadin clinic follow up appt on 11/20   Hyperlipidemia: Continue statins   Hypertension Was higher prior, better now, continue Entresto, Aldactone and carvedilol.  May need to add as needed IV hydralazine if continues to have spikes.   Type II  DM A1c, good outpatient control Hold metformin and continue very sensitive SSI.  Adjust insulins as needed.  Resume metformin from tomorrow.   Body mass index is 25.98 kg/m.      Consultants:   None   Procedures:        Discharge Instructions  Discharge Instructions     Call MD for:  difficulty breathing, headache or visual disturbances   Complete by: As directed    Call MD for:  extreme fatigue   Complete by: As directed    Call MD for:  hives   Complete by: As directed    Call MD for:  persistant dizziness or light-headedness   Complete by: As directed    Call MD for:  persistant nausea and vomiting   Complete by: As directed    Call MD for:  severe uncontrolled pain   Complete by: As directed    Call MD for:  temperature >100.4   Complete by: As directed    Diet - low sodium heart healthy   Complete by: As directed    Diet Carb Modified   Complete by: As directed    Discharge instructions   Complete by: As directed    Continue to use the cervical collar as recommended by your orthopedic/spine surgeon until outpatient follow-up with them in the office. Acetaminophen dose from all sources not to exceed 4 g/day.   Increase activity slowly   Complete by: As directed         Medication List     TAKE these medications    acetaminophen 325 MG tablet Commonly known as: TYLENOL Take 2 tablets (650 mg total) by mouth every 6 (six) hours as needed for mild pain (pain score 1-3) (or Fever >/= 101). What changed:  medication strength how much to take reasons to take this   albuterol 108 (90 Base) MCG/ACT inhaler Commonly known as: VENTOLIN HFA INHALE 2 PUFFS INTO THE LUNGS EVERY 6 HOURS AS NEEDED FOR WHEEZING OR SHORTNESS OF BREATH   ALKA-SELTZER PLUS COLD PO Take 1-2 tablets by mouth 2 (two) times daily as needed (cold symptoms).   atorvastatin 80 MG tablet Commonly known as: LIPITOR Take 1 tablet (80 mg total) by mouth daily.   azithromycin 500 MG  tablet Commonly known as: Zithromax Take 1 tablet (500 mg total) by mouth once for 1 dose. Take on 08/19/2023 evening. Last dose.   carvedilol 12.5 MG tablet Commonly known as: COREG Take 1 tablet (12.5 mg total) by mouth 2 (two) times daily with a meal. NEEDS FOLLOW UP APPOINTMENT FOR MORE REFILLS   cefdinir 300 MG capsule Commonly known as: OMNICEF Take 1 capsule (300 mg total) by mouth 2 (two) times daily for 3 days.   cholecalciferol 25 MCG (1000 UNIT) tablet Commonly known as: VITAMIN D3 Take 1,000 Units by mouth daily.   dapagliflozin propanediol 10 MG Tabs tablet Commonly known as: FARXIGA Take 1 tablet (10 mg  total) by mouth daily.   dicyclomine 20 MG tablet Commonly known as: BENTYL Take 1 tablet (20 mg total) by mouth 2 (two) times daily.   enoxaparin 80 MG/0.8ML injection Commonly known as: LOVENOX Inject 80 mg into the skin every 12 (twelve) hours.   Entresto 49-51 MG Generic drug: sacubitril-valsartan Take 1 tablet by mouth 2 (two) times daily. NEEDS FOLLOW UP APPOINTMENT FOR MORE REFILLS   fluticasone 50 MCG/ACT nasal spray Commonly known as: FLONASE SHAKE LIQUID AND USE 1 SPRAY IN EACH NOSTRIL DAILY What changed: See the new instructions.   gabapentin 300 MG capsule Commonly known as: NEURONTIN Take 300 mg by mouth daily as needed.   HYDROcodone-acetaminophen 5-325 MG tablet Commonly known as: NORCO/VICODIN Take 1 tablet by mouth every 6 (six) hours as needed for moderate pain (pain score 4-6) or severe pain (pain score 7-10).   metFORMIN 500 MG tablet Commonly known as: GLUCOPHAGE TAKE 1 TABLET(500 MG) BY MOUTH DAILY WITH BREAKFAST What changed: See the new instructions.   methocarbamol 500 MG tablet Commonly known as: ROBAXIN Take 1-2 tablets (500-1,000 mg total) by mouth every 8 (eight) hours as needed for muscle spasms.   Misc. Devices Misc by Does not apply route. Flutter valve daily   spironolactone 25 MG tablet Commonly known as:  ALDACTONE Take 0.5 tablets (12.5 mg total) by mouth daily. TAKE 1/2 TABLET(12.5 MG) BY MOUTH DAILY   Vicks VapoInhaler Inha Inhale 1 Dose into the lungs daily as needed (congestion).   Vyndamax 61 MG Caps Generic drug: Tafamidis Take 1 capsule by mouth daily.   warfarin 5 MG tablet Commonly known as: COUMADIN Take as directed. If you are unsure how to take this medication, talk to your nurse or doctor. Original instructions: Take 5 mg by mouth See admin instructions. Patient takes 2 tablets on Monday, Wednesday, and Friday, and 1 tablet on all other days.       Allergies  Allergen Reactions   Peanut-Containing Drug Products Swelling   Penicillins Other (See Comments)    CONVULSIONS    Decadron [Dexamethasone] Itching   Ivp Dye [Iodinated Contrast Media]       Procedures/Studies: CT Angio Chest PE W and/or Wo Contrast  Result Date: 08/18/2023 CLINICAL DATA:  Pulmonary embolism (PE) suspected, high prob. Severe back pain, shortness of breath EXAM: CT ANGIOGRAPHY CHEST WITH CONTRAST TECHNIQUE: Multidetector CT imaging of the chest was performed using the standard protocol during bolus administration of intravenous contrast. Multiplanar CT image reconstructions and MIPs were obtained to evaluate the vascular anatomy. RADIATION DOSE REDUCTION: This exam was performed according to the departmental dose-optimization program which includes automated exposure control, adjustment of the mA and/or kV according to patient size and/or use of iterative reconstruction technique. CONTRAST:  75mL OMNIPAQUE IOHEXOL 350 MG/ML SOLN COMPARISON:  03/14/2021 FINDINGS: Cardiovascular: No filling defects in the pulmonary arteries to suggest pulmonary emboli. Mild cardiomegaly. Scattered coronary artery and aortic calcifications. No evidence of aortic aneurysm. Mediastinum/Nodes: No mediastinal, hilar, or axillary adenopathy. Trachea and esophagus are unremarkable. Thyroid unremarkable. Moderate-sized hiatal  hernia. Lungs/Pleura: Left lower lobe airspace opacity concerning for pneumonia. Minimal right base atelectasis. No effusions. Upper Abdomen: Imaging into the upper abdomen demonstrates no acute findings. Musculoskeletal: Left chest wall pacer in place. Leads in the right heart. Chest wall soft tissues are unremarkable. No acute bony abnormality. Review of the MIP images confirms the above findings. IMPRESSION: No evidence of pulmonary embolus. Focal opacity in the left lower lobe concerning for pneumonia. Moderate-sized hiatal hernia.  Cardiomegaly, scattered coronary artery disease. Aortic Atherosclerosis (ICD10-I70.0). Electronically Signed   By: Charlett Nose M.D.   On: 08/18/2023 00:03   DG Chest 2 View  Result Date: 08/17/2023 CLINICAL DATA:  Back pain and shortness of breath. EXAM: CHEST - 2 VIEW COMPARISON:  July 31, 2023 FINDINGS: Single lead pacemaker in stable position. The cardiac silhouette is enlarged. Mediastinal contours appear intact. Patchy airspace consolidation in the left lower lobe and an element of volume loss. No definite pleural effusion. Osseous structures are without acute abnormality. Soft tissues are grossly normal. IMPRESSION: 1. Patchy airspace consolidation in the left lower lobe and an element of volume loss, concerning for pneumonia. 2. Cardiomegaly. Electronically Signed   By: Ted Mcalpine M.D.   On: 08/17/2023 16:06     Subjective: Denies complaints.  Feels fine.  Wants to go home.  Cough minimal, intermittent and dry.  No dyspnea, chest pain or back pain.  No fever or chills.  Has been ambulating independently and steadily in his room.  Discharge Exam:  Vitals:   08/18/23 1526 08/18/23 1914 08/19/23 0521 08/19/23 0728  BP: 128/76 133/85 132/67 (!) 139/97  Pulse: 89 93 83 87  Resp: 18 18 18 17   Temp: 98.6 F (37 C) 98.7 F (37.1 C) 98.5 F (36.9 C) 97.7 F (36.5 C)  TempSrc: Oral Oral Oral Oral  SpO2: 93% 96% 97% 95%  Weight:      Height:         General exam: Middle-age male, moderately built and nourished sitting propped up in bed without distress.  Has cervical collar on. Respiratory system: Clear to auscultation.  No increased work of breathing. Cardiovascular system: S1 & S2 heard, RRR. No JVD, murmurs, rubs, gallops or clicks. No pedal edema. Gastrointestinal system: Abdomen is nondistended, soft and nontender. No organomegaly or masses felt. Normal bowel sounds heard. Central nervous system: Alert and oriented. No focal neurological deficits. Extremities: Symmetric 5 x 5 power. Skin: No rashes, lesions or ulcers Psychiatry: Judgement and insight appear normal. Mood & affect appropriate.     The results of significant diagnostics from this hospitalization (including imaging, microbiology, ancillary and laboratory) are listed below for reference.     Microbiology: Recent Results (from the past 240 hour(s))  Blood culture (routine x 2)     Status: None (Preliminary result)   Collection Time: 08/17/23  5:56 PM   Specimen: BLOOD LEFT FOREARM  Result Value Ref Range Status   Specimen Description BLOOD LEFT FOREARM  Final   Special Requests   Final    BOTTLES DRAWN AEROBIC AND ANAEROBIC Blood Culture results may not be optimal due to an inadequate volume of blood received in culture bottles   Culture   Final    NO GROWTH 2 DAYS Performed at [facility], [address], [place], [place] [address]    Report Status PENDING  Incomplete  Blood culture (routine x 2)     Status: None (Preliminary result)   Collection Time: 08/17/23  8:07 PM   Specimen: BLOOD  Result Value Ref Range Status   Specimen Description BLOOD RIGHT ANTECUBITAL  Final   Special Requests   Final    BOTTLES DRAWN AEROBIC AND ANAEROBIC Blood Culture adequate volume   Culture   Final    NO GROWTH 2 DAYS Performed at [facility], [address], [place], [place] [address]    Report Status PENDING  Incomplete     Labs: CBC: Recent  Labs  Lab 08/17/23 1531 08/18/23 0646  WBC 9.6 7.0  HGB 13.1 12.7*  HCT 42.5 41.2  MCV 79.3* 78.2*  PLT 255 207    Basic Metabolic Panel: Recent Labs  Lab 08/17/23 1531 08/18/23 0646  NA 135 135  K 3.5 4.1  CL 102 104  CO2 21* 20*  GLUCOSE 134* 123*  BUN 9 12  CREATININE 0.97 0.99  CALCIUM 9.1 9.1  MG  --  2.1    Liver Function Tests: No results for input(s): "AST", "ALT", "ALKPHOS", "BILITOT", "PROT", "ALBUMIN" in the last 168 hours.  CBG: Recent Labs  Lab 08/18/23 1247 08/18/23 1629 08/18/23 2038 08/19/23 0726 08/19/23 1115  GLUCAP 139* 126* 114* 120* 137*    Hgb A1c Recent Labs    08/18/23 0646  HGBA1C 6.5*    Discussed with patient's sister at bedside.  Time coordinating discharge: 35 minutes  SIGNED:  Marcellus Scott, MD,  FACP, [facility], [facility], [facility]   [facility] Advisor [place]     To contact the attending provider between 7A-7P or the covering provider during after hours 7P-7A, please log into the web site [email or web address] and access using universal [place] password for that web site. If you do not have the password, please call the hospital operator.

## 2023-08-20 ENCOUNTER — Ambulatory Visit (INDEPENDENT_AMBULATORY_CARE_PROVIDER_SITE_OTHER): Payer: 59

## 2023-08-20 ENCOUNTER — Telehealth: Payer: Self-pay

## 2023-08-20 DIAGNOSIS — Z7901 Long term (current) use of anticoagulants: Secondary | ICD-10-CM | POA: Diagnosis not present

## 2023-08-20 DIAGNOSIS — I824Y9 Acute embolism and thrombosis of unspecified deep veins of unspecified proximal lower extremity: Secondary | ICD-10-CM

## 2023-08-20 LAB — LEGIONELLA PNEUMOPHILA SEROGP 1 UR AG: L. pneumophila Serogp 1 Ur Ag: NEGATIVE

## 2023-08-20 LAB — POCT INR: INR: 1.2 — AB (ref 2.0–3.0)

## 2023-08-20 NOTE — Patient Instructions (Signed)
Description   Continue lovenox injections twice a day. Today take another 1 tablet of warfarin and tomorrow take 2 tablets of warfarin then continue taking warfarin 1 tablet (5mg ) daily, except 2 tablets (10mg ) on Monday, Wednesday and Friday. Recheck INR on Monday (normally 6 weeks).  Coumadin Clinic [phone] Procedure Clearance Fax [phone] or [phone]

## 2023-08-20 NOTE — Transitions of Care (Post Inpatient/ED Visit) (Signed)
   08/20/2023  Name: Jonathan Dickson MRN: [id number] DOB: 19-Sep-1956  Today's TOC FU Call Status: Today's TOC FU Call Status:: Successful TOC FU Call Completed TOC FU Call Complete Date: 08/19/23 Patient's Name and Date of Birth confirmed.  Transition Care Management Follow-up Telephone Call [facility]charge [facility]) Type of Discharge: Inpatient Admission Primary Inpatient Discharge Diagnosis:: Pneumonia How have you been since you were released from the hospital?: Better Any questions or concerns?: No  Items Reviewed: Did you receive and understand the discharge instructions provided?: Yes Medications obtained,verified, and reconciled?: Yes (Medications Reviewed) Any new allergies since your discharge?: No Dietary orders reviewed?: Yes Type of Diet Ordered:: Low Sodium , card modified, Heart healthy Do you have support at home?: Yes People in Home: sibling(s) (sister helps when needed)  Medications Reviewed Today: Medications Reviewed Today   Medications were not reviewed in this encounter     Home Care and Equipment/Supplies: Were Home Health Services Ordered?: No Any new equipment or medical supplies ordered?: No  Functional Questionnaire: Do you need assistance with bathing/showering or dressing?: No (Patient offered PCS , Patient declined) Do you need assistance with meal preparation?: No Do you need assistance with eating?: No Do you need assistance with getting out of bed/getting out of a chair/moving?: No Do you have difficulty managing or taking your medications?: No  Follow up appointments reviewed: PCP Follow-up appointment confirmed?: Yes Date of PCP follow-up appointment?: 09/04/23 Follow-up Provider: E. Alvis Lemmings, MD Specialist Hospital Follow-up appointment confirmed?: Yes Date of Specialist follow-up appointment?: 08/20/23 Follow-Up Specialty Provider:: [facility] Do you need transportation to your follow-up  appointment?: No Do you understand care options if your condition(s) worsen?: Yes-patient verbalized understanding    SIGNATURE Elsie Lincoln, RN, BSN

## 2023-08-21 ENCOUNTER — Other Ambulatory Visit (HOSPITAL_COMMUNITY): Payer: Self-pay | Admitting: Family Medicine

## 2023-08-21 ENCOUNTER — Telehealth: Payer: Self-pay

## 2023-08-21 NOTE — Transitions of Care (Post Inpatient/ED Visit) (Signed)
08/21/2023  Name: Jonathan Dickson MRN: [id number] DOB: 27-Jul-1956  Today's TOC FU Call Status: Today's TOC FU Call Status:: Unsuccessful Call (1st Attempt) TOC FU Call Complete Date: 08/21/23 (1st TOC follow up by Elsie Lincoln from [facility] on 11/20) Patient's Name and Date of Birth confirmed.  Transition Care Management Follow-up Telephone Call Date of Discharge: 08/19/23 (1st TOC follow up by Elsie Lincoln from [facility] on 11/20) Discharge Facility: [facility]) Type of Discharge: Inpatient Admission Primary Inpatient Discharge Diagnosis:: Pneumonia How have you been since you were released from the hospital?: Better (successful Transition of Care outreach, focused on review of current meds - discussed some adjustments made recently by Cardiologist @ [facility] on 11/20 with Lovenox (pt to finish existing 4 days worth of doses left) and Coumadin dosing) Any questions or concerns?: No  Items Reviewed: Did you receive and understand the discharge instructions provided?: Yes Medications obtained,verified, and reconciled?: Yes (Medications Reviewed) Any new allergies since your discharge?: No Dietary orders reviewed?: Yes Type of Diet Ordered:: Low sodium, Heart healthy, Carbohydrate modified Do you have support at home?: Yes People in Home: sibling(s) Name of Support/Comfort Primary Source: sister, Pavlo Iocco helps when/as needed  Medications Reviewed Today: Medications Reviewed Today     Reviewed by Marcos Eke, RN (Registered Nurse) on 08/21/23 at 1313  Med List Status: <None>   Medication Order Taking? Sig Documenting Provider Last Dose Status Informant  acetaminophen (TYLENOL) 325 MG tablet [id number] Yes Take 2 tablets (650 mg total) by mouth every 6 (six) hours as needed for mild pain (pain score 1-3) (or Fever >/= 101). Elease Etienne, MD Taking Active   albuterol (VENTOLIN HFA) 108 (90 Base) MCG/ACT inhaler [id number] Yes INHALE 2 PUFFS INTO THE LUNGS EVERY  6 HOURS AS NEEDED FOR WHEEZING OR SHORTNESS OF BREATH Parrett, Virgel Bouquet, NP Taking Active Self, Pharmacy Records  Aromatic Inhalants Nickie Retort [place]) INHA [id number] Yes Inhale 1 Dose into the lungs daily as needed (congestion). [provider] Taking Active Self, Pharmacy Records  atorvastatin (LIPITOR) 80 MG tablet [id number] Yes Take 1 tablet (80 mg total) by mouth daily. Claiborne Rigg, NP Taking Active Self, Pharmacy Records  carvedilol (COREG) 12.5 MG tablet [id number] Yes Take 1 tablet (12.5 mg total) by mouth 2 (two) times daily with a meal. NEEDS FOLLOW UP APPOINTMENT FOR MORE REFILLS [place], Anderson Malta, FNP Taking Active Self, Pharmacy Records  cefdinir (OMNICEF) 300 MG capsule [id number] Yes Take 1 capsule (300 mg total) by mouth 2 (two) times daily for 3 days. Elease Etienne, MD Taking Active   Chlorphen-Phenyleph-ASA [facility] PLUS COLD PO) [id number] Yes Take 1-2 tablets by mouth 2 (two) times daily as needed (cold symptoms).  [provider] Taking Active Self, Pharmacy Records           Med Note [facility], [place] I   Fri Sep 13, 2022  3:46 PM) As needed  cholecalciferol (VITAMIN D3) 25 MCG (1000 UNIT) tablet [id number] Yes Take 1,000 Units by mouth daily. [provider] Taking Active Self, Pharmacy Records  dapagliflozin propanediol (FARXIGA) 10 MG TABS tablet [id number] Yes Take 1 tablet (10 mg total) by mouth daily. NEEDS FOLLOW UP APPOINTMENT FOR MORE REFILLS Bensimhon, Bevelyn Buckles, MD Taking Active   dicyclomine (BENTYL) 20 MG tablet [id number] Yes Take 1 tablet (20 mg total) by mouth 2 (two) times daily. Simeon Craft, PA-C Taking Active Self, Pharmacy Records  enoxaparin (LOVENOX) 80 MG/0.8ML injection [id number] Yes Inject 80 mg into the  skin every 12 (twelve) hours. [provider] Taking Active Self, Pharmacy Records           Med Note [place], AMY L   Sun Aug 17, 2023  8:04 PM) Last injection was at 7 am this morning, 08/17/2023.  fluticasone  (FLONASE) 50 MCG/ACT nasal spray [id number] Yes SHAKE LIQUID AND USE 1 SPRAY IN EACH NOSTRIL DAILY  Patient taking differently: Place 1 spray into both nostrils daily.   Charlott Holler, MD Taking Active Self, Pharmacy Records  gabapentin (NEURONTIN) 300 MG capsule [id number] Yes Take 300 mg by mouth daily as needed. [provider] Taking Active Self, Pharmacy Records  HYDROcodone-acetaminophen (NORCO/VICODIN) 5-325 MG tablet [id number] Yes Take 1 tablet by mouth every 6 (six) hours as needed for moderate pain (pain score 4-6) or severe pain (pain score 7-10). McKenzie, Eilene Ghazi, PA-C Taking Active Self, Pharmacy Records  metFORMIN (GLUCOPHAGE) 500 MG tablet [id number] Yes TAKE 1 TABLET(500 MG) BY MOUTH DAILY WITH BREAKFAST  Patient taking differently: Take 500 mg by mouth daily with breakfast.   Claiborne Rigg, NP Taking Active Self, Pharmacy Records  methocarbamol (ROBAXIN) 500 MG tablet [id number] Yes Take 1-2 tablets (500-1,000 mg total) by mouth every 8 (eight) hours as needed for muscle spasms. McKenzie, Eilene Ghazi, PA-C Taking Active Self, Pharmacy Records  Misc. Devices MISC [id number] Yes by Does not apply route. Flutter valve daily [provider] Taking Active Self, Pharmacy Records  sacubitril-valsartan [facility]) 49-51 MG [id number] Yes Take 1 tablet by mouth 2 (two) times daily. NEEDS FOLLOW UP APPOINTMENT FOR MORE REFILLS Dilley, Anderson Malta, FNP Taking Active Self, Pharmacy Records  spironolactone (ALDACTONE) 25 MG tablet [id number] Yes Take 0.5 tablets (12.5 mg total) by mouth daily. TAKE 1/2 TABLET(12.5 MG) BY MOUTH DAILY [place], Anderson Malta, FNP Taking Active Self, Pharmacy Records  Tafamidis [facility]) 61 MG CAPS [id number] Yes Take 1 capsule by mouth daily. Bensimhon, Bevelyn Buckles, MD Taking Active Self, Pharmacy Records  warfarin (COUMADIN) 5 MG tablet [id number] Yes Take 5 mg by mouth See admin instructions. Patient takes 2 tablets on Monday, Wednesday, and Friday, and 1 tablet  on all other days. [provider] Taking Active Self, Pharmacy Records            Home Care and Equipment/Supplies: Were Home Health Services Ordered?: No Any new equipment or medical supplies ordered?: No  Functional Questionnaire: Do you need assistance with bathing/showering or dressing?: No Do you need assistance with meal preparation?: No Do you need assistance with eating?: No Do you have difficulty maintaining continence: No Do you need assistance with getting out of bed/getting out of a chair/moving?: No Do you have difficulty managing or taking your medications?: No  Follow up appointments reviewed: PCP Follow-up appointment confirmed?: Yes Date of PCP follow-up appointment?: 09/04/23 Follow-up Provider: Dr. Alvis Lemmings Specialist [facility] Follow-up appointment confirmed?: Yes Date of Specialist follow-up appointment?: 08/25/23 Follow-Up Specialty Provider:: Cardio-Vascular ([facility]) - [facility] to review current anit-coagulation therapy Do you need transportation to your follow-up appointment?: No Do you understand care options if your condition(s) worsen?: Yes-patient verbalized understanding  SDOH Interventions Today    Flowsheet Row Most Recent Value  SDOH Interventions   Housing Interventions Patient Declined  Utilities Interventions Patient Declined       Alyse Low, RN, BA, [facility], CRRN [facility] Population Health Care Management Coordinator, Transition of Care Ph # [phone]

## 2023-08-22 LAB — CULTURE, BLOOD (ROUTINE X 2)
Culture: NO GROWTH
Culture: NO GROWTH
Special Requests: ADEQUATE

## 2023-08-25 ENCOUNTER — Ambulatory Visit: Payer: 59 | Attending: Cardiovascular Disease

## 2023-08-25 DIAGNOSIS — I824Y9 Acute embolism and thrombosis of unspecified deep veins of unspecified proximal lower extremity: Secondary | ICD-10-CM | POA: Diagnosis not present

## 2023-08-25 DIAGNOSIS — Z7901 Long term (current) use of anticoagulants: Secondary | ICD-10-CM | POA: Diagnosis not present

## 2023-08-25 LAB — POCT INR: INR: 2.2 (ref 2.0–3.0)

## 2023-08-25 NOTE — Patient Instructions (Signed)
continue taking warfarin 1 tablet (5mg ) daily, except 2 tablets (10mg ) on Monday, Wednesday and Friday. Recheck INR on Monday (normally 6 weeks).  [facility] [phone] Procedure Clearance Fax [phone] or [phone]

## 2023-08-27 ENCOUNTER — Other Ambulatory Visit: Payer: Self-pay

## 2023-08-27 NOTE — Progress Notes (Signed)
Specialty Pharmacy Refill Coordination Note  GARLEN NAFFZIGER is a 67 y.o. male contacted today regarding refills of specialty medication(s) Tafamidis   Patient requested Delivery   Delivery date: 09/01/23   Verified address: [address]   Medication will be filled on 08/29/23.

## 2023-08-29 ENCOUNTER — Other Ambulatory Visit (HOSPITAL_COMMUNITY): Payer: Self-pay

## 2023-09-04 ENCOUNTER — Ambulatory Visit: Payer: 59 | Attending: Family Medicine | Admitting: Family Medicine

## 2023-09-04 ENCOUNTER — Encounter: Payer: Self-pay | Admitting: Family Medicine

## 2023-09-04 VITALS — BP 121/76 | HR 90 | Ht 66.0 in | Wt 161.6 lb

## 2023-09-04 DIAGNOSIS — Z7984 Long term (current) use of oral hypoglycemic drugs: Secondary | ICD-10-CM

## 2023-09-04 DIAGNOSIS — J189 Pneumonia, unspecified organism: Secondary | ICD-10-CM

## 2023-09-04 DIAGNOSIS — M5412 Radiculopathy, cervical region: Secondary | ICD-10-CM

## 2023-09-04 DIAGNOSIS — E1169 Type 2 diabetes mellitus with other specified complication: Secondary | ICD-10-CM

## 2023-09-04 DIAGNOSIS — K449 Diaphragmatic hernia without obstruction or gangrene: Secondary | ICD-10-CM

## 2023-09-04 DIAGNOSIS — I5022 Chronic systolic (congestive) heart failure: Secondary | ICD-10-CM

## 2023-09-04 DIAGNOSIS — Z86718 Personal history of other venous thrombosis and embolism: Secondary | ICD-10-CM

## 2023-09-04 DIAGNOSIS — E785 Hyperlipidemia, unspecified: Secondary | ICD-10-CM

## 2023-09-04 MED ORDER — ATORVASTATIN CALCIUM 80 MG PO TABS: 80.0000 mg | ORAL_TABLET | Freq: Every day | ORAL | 1 refills | Status: DC

## 2023-09-04 NOTE — Progress Notes (Signed)
Subjective:  Patient ID: Jonathan Dickson, male    DOB: 1956-09-19  Age: 67 y.o. MRN: [id number]  CC: Hospitalization Follow-up   HPI Jonathan Dickson is a 67 y.o. year old male with a history of cardiac amyloidosis, DVT, PE (on anticoagulation with warfarin), CHF (EF 30 to 35%, status post ICD), type 2 diabetes mellitus, hypertension, Cervical stenosis with radiculopathy (status post ACDF in 08/2023).  Interval History: Discussed the use of AI scribe software for clinical note transcription with the patient, who gave verbal consent to proceed.  He presents for a follow-up visit after a recent hospitalization for pneumonia from 08/17/2023 through 08/19/2023 after he presented with productive cough, dyspnea, tachycardia. CTA chest revealed: IMPRESSION: No evidence of pulmonary embolus.   Focal opacity in the left lower lobe concerning for pneumonia.   Moderate-sized hiatal hernia.   Cardiomegaly, scattered coronary artery disease.   Aortic Atherosclerosis (ICD10-I70.0).  He has completed a three-day course of antibiotics post discharge and reports feeling 'okay' since discharge. He denies any shortness of breath or chest pain. He has not yet followed up with his cardiologist since discharge.  The patient also recently underwent neck surgery and is currently wearing a neck brace, which he expects to remove in two weeks. He has not reported any complications or concerns related to the surgery.  He remains adherent with his warfarin.   For his CHF he is under cardiology care and has been adherent with his appointment.  He is requesting a refill of his statin for hyperlipidemia.   Past Medical History:  Diagnosis Date   Abnormal TSH 02/2016   AICD (automatic cardioverter/defibrillator) present    St Jude   Anxiety    Blood clotting disorder (HCC)    Bronchiectasis (HCC)    left lower lung   Cardiac amyloidosis (HCC)    Cervical disc disease    Chronic combined systolic and  diastolic CHF (congestive heart failure) (HCC)    a. 01/2016 Echo: EF 25%, inf AK, diffuse sev HK, Gr1 DD, mild MR.   Coronary atherosclerosis    Depression    Diabetes mellitus without complication (HCC)    type 2   Dyspnea    with exertion   Dysrhythmia    Full thickness rotator cuff tear 06/2021   MRI   History of DVT (deep vein thrombosis) 08/04/2019   venous doppler US   History of hiatal hernia    Hx of adenomatous polyp of colon    Hyperglycemia    Hyperlipidemia    Hypertension    Hypertensive heart disease    Myocardial infarction (HCC)    greater than 5 yrs per patient on 08/06/23   NICM (nonischemic cardiomyopathy) (HCC)    a. 01/2016 Echo: EF 25%, inf AK, diffuse sev HK, Gr1 DD, mild MR; b. 01/2016 MV: EF 22%, no isch/infarct;  c. 02/2016 Cath: Nl cors, EF 35-45%.   Osteoarthritis of hips, bilateral    and hands   Paraseptal emphysema (HCC)    mild   Peanut allergy    Pneumonia    x 1   Pre-diabetes    hx but now DM 2   Presence of implantable cardioverter-defibrillator (ICD)    St Jude   S/P repair of ventral hernia    Seizures (HCC)    allergic with penicillin - only one seizure   Sensorineural hearing loss 11/25/2017   no hearing aids, patient denies this dx   Substance abuse (HCC)    none in  20 yrs per patient as of 08/06/23    Past Surgical History:  Procedure Laterality Date   ANTERIOR CERVICAL DECOMP/DISCECTOMY FUSION N/A 07/10/2016   Procedure: ANTERIOR CERVICAL DECOMPRESSION FUSION CERVICAL 4-5, CERVICAL 5-6, CERVICAL 6-7 WITH INSTRUMENTATION AND ALLOGRAFT;  Surgeon: Estill Bamberg, MD;  Location: [facility] OR;  Service: Orthopedics;  Laterality: N/A;   ANTERIOR CERVICAL DECOMP/DISCECTOMY FUSION N/A 04/19/2021   Procedure: ANTERIOR CERVICAL DECOMPRESSION FUSION CERVICAL 3- CERVICAL 4 WITH INSTRUMENTATION AND ALLOGRAFT;  Surgeon: Estill Bamberg, MD;  Location: [facility] OR;  Service: Orthopedics;  Laterality: N/A;   ANTERIOR CERVICAL DECOMP/DISCECTOMY FUSION N/A  08/14/2023   Procedure: ANTERIOR CERVICAL DECOMPRESSION FUSION CERVICAL 7 - THORACIC 1 WITH INSTRUMENTATION AND ALLOGRAFT;  Surgeon: Estill Bamberg, MD;  Location: [facility] OR;  Service: Orthopedics;  Laterality: N/A;   BIOPSY  07/28/2023   Procedure: BIOPSY;  Surgeon: Benancio Deeds, MD;  Location: [facility]OSCOPY;  Service: Gastroenterology;;   CARDIAC CATHETERIZATION N/A 03/11/2016   Procedure: Left Heart Cath and Coronary Angiography;  Surgeon: Marykay Lex, MD;  Location: [facility] INVASIVE CV LAB;  Service: Cardiovascular;  Laterality: N/A;   COLONOSCOPY     COLONOSCOPY WITH PROPOFOL N/A 07/28/2023   Procedure: COLONOSCOPY WITH PROPOFOL;  Surgeon: Benancio Deeds, MD;  Location: [facility];  Service: Gastroenterology;  Laterality: N/A;   ESOPHAGOGASTRODUODENOSCOPY (EGD) WITH PROPOFOL N/A 07/28/2023   Procedure: ESOPHAGOGASTRODUODENOSCOPY (EGD) WITH PROPOFOL;  Surgeon: Benancio Deeds, MD;  Location: [facility];  Service: Gastroenterology;  Laterality: N/A;   HOT HEMOSTASIS N/A 07/28/2023   Procedure: HOT HEMOSTASIS (ARGON PLASMA COAGULATION/BICAP);  Surgeon: Benancio Deeds, MD;  Location: [facility];  Service: Gastroenterology;  Laterality: N/A;   ICD IMPLANT N/A 06/17/2018   Procedure: ICD IMPLANT;  Surgeon: Regan Lemming, MD;  Location: [facility] INVASIVE CV LAB;  Service: Cardiovascular;  Laterality: N/A;   POLYPECTOMY  07/28/2023   Procedure: POLYPECTOMY;  Surgeon: Benancio Deeds, MD;  Location: [facility];  Service: Gastroenterology;;   Thumb surgery Right    VENTRAL HERNIA REPAIR N/A 10/25/2019   Procedure: PRIMARY VENTRAL HERNIA REPAIR;  Surgeon: Berna Bue, MD;  Location: [facility] ORS;  Service: General;  Laterality: N/A;    Family History  Problem Relation Age of Onset   Diabetes Mother    Diabetes Sister    Clotting disorder Neg Hx    Lung disease Neg Hx    Colon cancer Neg Hx    Esophageal cancer Neg Hx    Stomach cancer Neg Hx    Pancreatic  cancer Neg Hx    Colon polyps Neg Hx    Rectal cancer Neg Hx     Social History   Socioeconomic History   Marital status: Legally Separated    Spouse name: Not on file   Number of children: 3   Years of education: Not on file   Highest education level: 10th grade  Occupational History   Not on file  Tobacco Use   Smoking status: Former    Current packs/day: 0.00    Average packs/day: 1.5 packs/day for 18.0 years (27.0 ttl pk-yrs)    Types: Cigarettes    Start date: 03/14/1985    Quit date: 03/15/2003    Years since quitting: 20.4   Smokeless tobacco: Never  Vaping Use   Vaping status: Never Used  Substance and Sexual Activity   Alcohol use: No    Comment: former, none since 2004   Drug use: Yes    Types: Heroin, Marijuana, "Crack" cocaine  Comment: former, none since 2004   Sexual activity: Not Currently  Other Topics Concern   Not on file  Social History Narrative   Not on file   Social Determinants of Health   Financial Resource Strain: Low Risk  (05/06/2023)   Overall Financial Resource Strain (CARDIA)    Difficulty of Paying Living Expenses: Not hard at all  Food Insecurity: No Food Insecurity (08/21/2023)   Hunger Vital Sign    Worried About Running Out of Food in the Last Year: Never true    Ran Out of Food in the Last Year: Never true  Transportation Needs: No Transportation Needs (08/21/2023)   PRAPARE - Administrator, Civil Service (Medical): No    Lack of Transportation (Non-Medical): No  Physical Activity: Insufficiently Active (05/06/2023)   Exercise Vital Sign    Days of Exercise per Week: 3 days    Minutes of Exercise per Session: 30 min  Stress: No Stress Concern Present (05/06/2023)   [employer] of Occupational Health - Occupational Stress Questionnaire    Feeling of Stress : Not at all  Social Connections: Moderately Isolated (05/06/2023)   Social Connection and Isolation Panel [NHANES]    Frequency of Communication with Friends  and Family: More than three times a week    Frequency of Social Gatherings with Friends and Family: Once a week    Attends Religious Services: 1 to 4 times per year    Active Member of [employer] or Organizations: No    Attends Banker Meetings: Never    Marital Status: Widowed    Allergies  Allergen Reactions   Peanut-Containing Drug Products Swelling   Penicillins Other (See Comments)    CONVULSIONS    Decadron [Dexamethasone] Itching   Ivp Dye [Iodinated Contrast Media]     Outpatient Medications Prior to Visit  Medication Sig Dispense Refill   acetaminophen (TYLENOL) 325 MG tablet Take 2 tablets (650 mg total) by mouth every 6 (six) hours as needed for mild pain (pain score 1-3) (or Fever >/= 101).     albuterol (VENTOLIN HFA) 108 (90 Base) MCG/ACT inhaler INHALE 2 PUFFS INTO THE LUNGS EVERY 6 HOURS AS NEEDED FOR WHEEZING OR SHORTNESS OF BREATH 6.7 g 3   Aromatic Inhalants (VICKS VAPOINHALER) INHA Inhale 1 Dose into the lungs daily as needed (congestion).     carvedilol (COREG) 12.5 MG tablet Take 1 tablet (12.5 mg total) by mouth 2 (two) times daily with a meal. NEEDS FOLLOW UP APPOINTMENT FOR MORE REFILLS 180 tablet 0   Chlorphen-Phenyleph-ASA (ALKA-SELTZER PLUS COLD PO) Take 1-2 tablets by mouth 2 (two) times daily as needed (cold symptoms).      cholecalciferol (VITAMIN D3) 25 MCG (1000 UNIT) tablet Take 1,000 Units by mouth daily.     dapagliflozin propanediol (FARXIGA) 10 MG TABS tablet Take 1 tablet (10 mg total) by mouth daily. NEEDS FOLLOW UP APPOINTMENT FOR MORE REFILLS 30 tablet 2   dicyclomine (BENTYL) 20 MG tablet Take 1 tablet (20 mg total) by mouth 2 (two) times daily. 10 tablet 0   enoxaparin (LOVENOX) 80 MG/0.8ML injection Inject 80 mg into the skin every 12 (twelve) hours.     fluticasone (FLONASE) 50 MCG/ACT nasal spray SHAKE LIQUID AND USE 1 SPRAY IN EACH NOSTRIL DAILY (Patient taking differently: Place 1 spray into both nostrils daily.) 16 g 1    gabapentin (NEURONTIN) 300 MG capsule Take 300 mg by mouth daily as needed.     HYDROcodone-acetaminophen (NORCO/VICODIN) 5-325  MG tablet Take 1 tablet by mouth every 6 (six) hours as needed for moderate pain (pain score 4-6) or severe pain (pain score 7-10). 20 tablet 0   metFORMIN (GLUCOPHAGE) 500 MG tablet TAKE 1 TABLET(500 MG) BY MOUTH DAILY WITH BREAKFAST (Patient taking differently: Take 500 mg by mouth daily with breakfast.) 90 tablet 0   methocarbamol (ROBAXIN) 500 MG tablet Take 1-2 tablets (500-1,000 mg total) by mouth every 8 (eight) hours as needed for muscle spasms. 30 tablet 0   Misc. Devices MISC by Does not apply route. Flutter valve daily     sacubitril-valsartan (ENTRESTO) 49-51 MG Take 1 tablet by mouth 2 (two) times daily. NEEDS FOLLOW UP APPOINTMENT FOR MORE REFILLS 180 tablet 0   spironolactone (ALDACTONE) 25 MG tablet Take 0.5 tablets (12.5 mg total) by mouth daily. TAKE 1/2 TABLET(12.5 MG) BY MOUTH DAILY 15 tablet 11   Tafamidis (VYNDAMAX) 61 MG CAPS Take 1 capsule by mouth daily. 30 capsule 11   warfarin (COUMADIN) 5 MG tablet Take 5 mg by mouth See admin instructions. Patient takes 2 tablets on Monday, Wednesday, and Friday, and 1 tablet on all other days.     atorvastatin (LIPITOR) 80 MG tablet Take 1 tablet (80 mg total) by mouth daily. 90 tablet 3   No facility-administered medications prior to visit.     ROS Review of Systems  Constitutional:  Negative for activity change and appetite change.  HENT:  Negative for sinus pressure and sore throat.   Respiratory:  Negative for chest tightness, shortness of breath and wheezing.   Cardiovascular:  Negative for chest pain and palpitations.  Gastrointestinal:  Negative for abdominal distention, abdominal pain and constipation.  Genitourinary: Negative.   Musculoskeletal: Negative.   Psychiatric/Behavioral:  Negative for behavioral problems and dysphoric mood.     Objective:  BP 121/76   Pulse 90   Ht 5\' 6"  (1.676  m)   Wt 161 lb 9.6 oz (73.3 kg)   SpO2 100%   BMI 26.08 kg/m      09/04/2023   11:00 AM 08/19/2023    7:28 AM 08/19/2023    5:21 AM  BP/Weight  Systolic BP 121 139 132  Diastolic BP 76 97 67  Wt. (Lbs) 161.6    BMI 26.08 kg/m2        Physical Exam Constitutional:      Appearance: He is well-developed.  Neck:     Comments: Currently wearing a neck collar Cardiovascular:     Rate and Rhythm: Normal rate.     Heart sounds: Normal heart sounds. No murmur heard. Pulmonary:     Effort: Pulmonary effort is normal.     Breath sounds: Normal breath sounds. No wheezing or rales.  Chest:     Chest wall: No tenderness.  Abdominal:     General: Bowel sounds are normal. There is no distension.     Palpations: Abdomen is soft. There is no mass.     Tenderness: There is no abdominal tenderness.  Musculoskeletal:        General: Normal range of motion.     Cervical back: Rigidity present.     Right lower leg: No edema.     Left lower leg: No edema.  Neurological:     Mental Status: He is alert and oriented to person, place, and time.  Psychiatric:        Mood and Affect: Mood normal.        Latest Ref Rng & Units 08/18/2023  6:46 AM 08/17/2023    3:31 PM 07/31/2023    7:44 AM  CMP  Glucose 70 - 99 mg/dL 478  295  621   BUN 8 - 23 mg/dL 12  9  17    Creatinine 0.61 - 1.24 mg/dL 3.08  6.57  8.46   Sodium 135 - 145 mmol/L 135  135  136   Potassium 3.5 - 5.1 mmol/L 4.1  3.5  3.8   Chloride 98 - 111 mmol/L 104  102  103   CO2 22 - 32 mmol/L 20  21  23    Calcium 8.9 - 10.3 mg/dL 9.1  9.1  9.4   Total Protein 6.5 - 8.1 g/dL   7.8   Total Bilirubin 0.3 - 1.2 mg/dL   0.7   Alkaline Phos 38 - 126 U/L   63   AST 15 - 41 U/L   23   ALT 0 - 44 U/L   23     Lipid Panel     Component Value Date/Time   CHOL 152 10/07/2022 1049   TRIG 175 (H) 10/07/2022 1049   HDL 44 10/07/2022 1049   CHOLHDL 3.5 10/07/2022 1049   CHOLHDL 2.9 10/04/2014 1508   VLDL 17 10/04/2014 1508    LDLCALC 78 10/07/2022 1049    CBC    Component Value Date/Time   WBC 7.0 08/18/2023 0646   RBC 5.27 08/18/2023 0646   HGB 12.7 (L) 08/18/2023 0646   HGB 12.2 (L) 06/04/2023 1501   HGB 12.6 (L) 02/05/2023 0931   HCT 41.2 08/18/2023 0646   HCT 42.3 02/05/2023 0931   PLT 207 08/18/2023 0646   PLT 231 06/04/2023 1501   PLT 235 02/05/2023 0931   MCV 78.2 (L) 08/18/2023 0646   MCV 79 02/05/2023 0931   MCH 24.1 (L) 08/18/2023 0646   MCHC 30.8 08/18/2023 0646   RDW 23.4 (H) 08/18/2023 0646   RDW 17.3 (H) 02/05/2023 0931   LYMPHSABS 1.5 07/31/2023 0744   LYMPHSABS 2.3 02/05/2023 0931   MONOABS 0.8 07/31/2023 0744   EOSABS 0.2 07/31/2023 0744   EOSABS 0.2 02/05/2023 0931   BASOSABS 0.0 07/31/2023 0744   BASOSABS 0.1 02/05/2023 0931    Lab Results  Component Value Date   HGBA1C 6.5 (H) 08/18/2023    Assessment & Plan:      Pneumonia Resolved after treatment with antibiotics. No current symptoms of shortness of breath or chest pain. Oxygen saturation is 100%. -No further action required at this time.  Congestive Heart Failure EF 30 to 35% from echo of 2022, status post ICD No recent follow-up with cardiologist since hospital discharge. -Recommend follow-up with cardiologist.  Cervical stenosis with radiculopathy Recent surgery performed by Dr. Barnie Mort. Patient reports improvement and plans to remove neck brace in two weeks. -No further action required at this time.  History of DVT Patient has a history of blood clots and is currently on warfarin. Recent CT angiogram showed no current blood clots. -Continue current warfarin therapy.  Hernia Noted on recent CT angiogram. -No plan discussed during this visit.  Coronary Artery Disease Noted on recent CT angiogram. -Continue current management.  Medication Refill Patient reported needing a refill of Atorvastatin. -Refill Atorvastatin prescription.  Follow-up Last seen by primary care provider in May. -Schedule  follow-up appointment with primary care provider.          Meds ordered this encounter  Medications   atorvastatin (LIPITOR) 80 MG tablet    Sig: Take 1 tablet (80  mg total) by mouth daily.    Dispense:  90 tablet    Refill:  1    **Patient requests 90 days supply**    Follow-up: Return in about 1 month (around 10/05/2023) for Medical conditions with PCP.       Hoy Register, MD, FAAFP. [facility] [place], [place] [phone]   09/04/2023, 3:19 PM

## 2023-09-04 NOTE — Patient Instructions (Signed)
VISIT SUMMARY:  You came in today for a follow-up visit after your recent hospitalization for pneumonia. You have completed your antibiotics and are feeling okay. We also discussed your ongoing recovery from neck surgery, your current warfarin therapy, and your need for a medication refill.  YOUR PLAN:  -PNEUMONIA: Your pneumonia has resolved after completing the antibiotics. Since you have no symptoms like shortness of breath or chest pain, no further action is needed at this time.  -CONGESTIVE HEART FAILURE: Congestive heart failure is a condition where the heart doesn't pump blood as well as it should. You need to follow up with your cardiologist since you haven't done so after your hospital discharge.  -NECK SURGERY: You recently had neck surgery and are currently wearing a neck brace. You plan to remove the neck brace in two weeks, and no further action is needed at this time.  -WARFARIN THERAPY: Warfarin is a blood thinner used to prevent blood clots. Your recent CT angiogram showed no current blood clots, so you should continue your current warfarin therapy.  -HERNIA: A hernia is when an organ pushes through an opening in the muscle or tissue that holds it in place. This was noted on your recent CT angiogram, but no plan was discussed during this visit.  -CORONARY ARTERY DISEASE: Coronary artery disease is caused by cholesterol deposits in your arteries. You should continue your current management plan for this condition.  -MEDICATION REFILL: You needed a refill of your Atorvastatin prescription, which has been provided.  INSTRUCTIONS:  Please schedule a follow-up appointment with your primary care provider, as you were last seen in May. Additionally, make sure to follow up with your cardiologist regarding your congestive heart failure.

## 2023-09-05 ENCOUNTER — Other Ambulatory Visit: Payer: Self-pay | Admitting: Cardiovascular Disease

## 2023-09-19 ENCOUNTER — Telehealth (HOSPITAL_COMMUNITY): Payer: Self-pay | Admitting: Cardiology

## 2023-09-19 DIAGNOSIS — E1169 Type 2 diabetes mellitus with other specified complication: Secondary | ICD-10-CM

## 2023-09-19 MED ORDER — DAPAGLIFLOZIN PROPANEDIOL 10 MG PO TABS: 10.0000 mg | ORAL_TABLET | Freq: Every day | ORAL | 2 refills | Status: DC

## 2023-09-19 MED ORDER — ATORVASTATIN CALCIUM 80 MG PO TABS: 80.0000 mg | ORAL_TABLET | Freq: Every day | ORAL | 2 refills | Status: DC

## 2023-09-19 NOTE — Telephone Encounter (Signed)
Med refills requested for  atorvastatin and farxiga Follow up made Meds refilled

## 2023-09-22 ENCOUNTER — Other Ambulatory Visit: Payer: Self-pay

## 2023-09-22 NOTE — Progress Notes (Signed)
Specialty Pharmacy Refill Coordination Note  Jonathan Dickson is a 67 y.o. male contacted today regarding refills of specialty medication(s) Tafamidis Jeannie Fend)   Patient requested Delivery   Delivery date: 09/29/23   Verified address: [address]   Medication will be filled on 09/26/23.

## 2023-09-23 ENCOUNTER — Ambulatory Visit (INDEPENDENT_AMBULATORY_CARE_PROVIDER_SITE_OTHER): Payer: 59

## 2023-09-23 DIAGNOSIS — I428 Other cardiomyopathies: Secondary | ICD-10-CM

## 2023-09-23 DIAGNOSIS — I5022 Chronic systolic (congestive) heart failure: Secondary | ICD-10-CM

## 2023-09-25 LAB — CUP PACEART REMOTE DEVICE CHECK
Battery Remaining Longevity: 54 mo
Battery Remaining Percentage: 54 %
Battery Voltage: 2.95 V
Brady Statistic RV Percent Paced: 0 %
Date Time Interrogation Session: 20241224020549
HighPow Impedance: 71 Ohm
HighPow Impedance: 71 Ohm
Implantable Lead Connection Status: 753985
Implantable Lead Implant Date: 20190918
Implantable Lead Location: 753860
Implantable Lead Model: 7122
Implantable Pulse Generator Implant Date: 20190918
Lead Channel Impedance Value: 400 Ohm
Lead Channel Pacing Threshold Amplitude: 0.75 V
Lead Channel Pacing Threshold Pulse Width: 0.5 ms
Lead Channel Sensing Intrinsic Amplitude: 12 mV
Lead Channel Setting Pacing Amplitude: 2.5 V
Lead Channel Setting Pacing Pulse Width: 0.5 ms
Lead Channel Setting Sensing Sensitivity: 0.5 mV
Pulse Gen Serial Number: 9796641

## 2023-09-26 ENCOUNTER — Other Ambulatory Visit: Payer: Self-pay

## 2023-10-03 ENCOUNTER — Other Ambulatory Visit: Payer: Self-pay

## 2023-10-03 DIAGNOSIS — D500 Iron deficiency anemia secondary to blood loss (chronic): Secondary | ICD-10-CM

## 2023-10-06 ENCOUNTER — Inpatient Hospital Stay (HOSPITAL_BASED_OUTPATIENT_CLINIC_OR_DEPARTMENT_OTHER): Payer: 59 | Admitting: Hematology

## 2023-10-06 ENCOUNTER — Encounter: Payer: Self-pay | Admitting: Hematology

## 2023-10-06 ENCOUNTER — Ambulatory Visit: Payer: 59

## 2023-10-06 ENCOUNTER — Inpatient Hospital Stay: Payer: 59 | Attending: Hematology

## 2023-10-06 VITALS — BP 154/87 | HR 89 | Temp 97.2°F | Wt 165.3 lb

## 2023-10-06 DIAGNOSIS — E538 Deficiency of other specified B group vitamins: Secondary | ICD-10-CM

## 2023-10-06 DIAGNOSIS — D509 Iron deficiency anemia, unspecified: Secondary | ICD-10-CM | POA: Diagnosis present

## 2023-10-06 DIAGNOSIS — Z7901 Long term (current) use of anticoagulants: Secondary | ICD-10-CM | POA: Diagnosis not present

## 2023-10-06 DIAGNOSIS — D500 Iron deficiency anemia secondary to blood loss (chronic): Secondary | ICD-10-CM | POA: Diagnosis not present

## 2023-10-06 DIAGNOSIS — Z87891 Personal history of nicotine dependence: Secondary | ICD-10-CM | POA: Insufficient documentation

## 2023-10-06 DIAGNOSIS — Z86718 Personal history of other venous thrombosis and embolism: Secondary | ICD-10-CM | POA: Diagnosis not present

## 2023-10-06 DIAGNOSIS — Z86711 Personal history of pulmonary embolism: Secondary | ICD-10-CM | POA: Diagnosis not present

## 2023-10-06 LAB — CBC WITH DIFFERENTIAL (CANCER CENTER ONLY)
Abs Immature Granulocytes: 0.02 10*3/uL (ref 0.00–0.07)
Basophils Absolute: 0.1 10*3/uL (ref 0.0–0.1)
Basophils Relative: 1 %
Eosinophils Absolute: 0.2 10*3/uL (ref 0.0–0.5)
Eosinophils Relative: 3 %
HCT: 46 % (ref 39.0–52.0)
Hemoglobin: 15 g/dL (ref 13.0–17.0)
Immature Granulocytes: 0 %
Lymphocytes Relative: 40 %
Lymphs Abs: 2.3 10*3/uL (ref 0.7–4.0)
MCH: 26.6 pg (ref 26.0–34.0)
MCHC: 32.6 g/dL (ref 30.0–36.0)
MCV: 81.7 fL (ref 80.0–100.0)
Monocytes Absolute: 0.5 10*3/uL (ref 0.1–1.0)
Monocytes Relative: 8 %
Neutro Abs: 2.8 10*3/uL (ref 1.7–7.7)
Neutrophils Relative %: 48 %
Platelet Count: 215 10*3/uL (ref 150–400)
RBC: 5.63 MIL/uL (ref 4.22–5.81)
RDW: 21 % — ABNORMAL HIGH (ref 11.5–15.5)
WBC Count: 5.8 10*3/uL (ref 4.0–10.5)
nRBC: 0 % (ref 0.0–0.2)

## 2023-10-06 LAB — IRON AND IRON BINDING CAPACITY (CC-WL,HP ONLY)
Iron: 56 ug/dL (ref 45–182)
Saturation Ratios: 13 % — ABNORMAL LOW (ref 17.9–39.5)
TIBC: 435 ug/dL (ref 250–450)
UIBC: 379 ug/dL — ABNORMAL HIGH (ref 117–376)

## 2023-10-06 LAB — CMP (CANCER CENTER ONLY)
ALT: 13 U/L (ref 0–44)
AST: 18 U/L (ref 15–41)
Albumin: 4.3 g/dL (ref 3.5–5.0)
Alkaline Phosphatase: 81 U/L (ref 38–126)
Anion gap: 8 (ref 5–15)
BUN: 20 mg/dL (ref 8–23)
CO2: 23 mmol/L (ref 22–32)
Calcium: 10.2 mg/dL (ref 8.9–10.3)
Chloride: 104 mmol/L (ref 98–111)
Creatinine: 1.14 mg/dL (ref 0.61–1.24)
GFR, Estimated: >60 mL/min (ref >60)
Glucose, Bld: 78 mg/dL (ref 70–99)
Potassium: 4.5 mmol/L (ref 3.5–5.1)
Sodium: 135 mmol/L (ref 135–145)
Total Bilirubin: 0.4 mg/dL (ref 0.0–1.2)
Total Protein: 7.5 g/dL (ref 6.5–8.1)

## 2023-10-06 LAB — FERRITIN: Ferritin: 25 ng/mL (ref 24–336)

## 2023-10-06 LAB — VITAMIN B12: Vitamin B-12: 191 pg/mL (ref 180–914)

## 2023-10-07 ENCOUNTER — Telehealth: Payer: Self-pay | Admitting: Hematology

## 2023-10-07 NOTE — Telephone Encounter (Signed)
Spoke to patient confirming upcoming appointment
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
## 2023-10-13 ENCOUNTER — Encounter: Payer: Self-pay | Admitting: Hematology

## 2023-10-15 ENCOUNTER — Ambulatory Visit: Payer: 59 | Attending: Cardiovascular Disease | Admitting: *Deleted

## 2023-10-15 ENCOUNTER — Other Ambulatory Visit (HOSPITAL_COMMUNITY): Payer: Self-pay | Admitting: Family Medicine

## 2023-10-15 DIAGNOSIS — I824Y9 Acute embolism and thrombosis of unspecified deep veins of unspecified proximal lower extremity: Secondary | ICD-10-CM | POA: Diagnosis not present

## 2023-10-15 DIAGNOSIS — Z7901 Long term (current) use of anticoagulants: Secondary | ICD-10-CM | POA: Diagnosis not present

## 2023-10-15 LAB — POCT INR: INR: 1.9 — AB (ref 2.0–3.0)

## 2023-10-15 NOTE — Patient Instructions (Signed)
 Description   Today take another 1/2 tablet of warfarin then continue taking warfarin 1 tablet (5mg ) daily, except 2 tablets (10mg ) on Monday, Wednesday and Friday. Recheck INR in 5 weeks. Coumadin  Clinic [phone] Procedure Clearance Fax [phone] or [phone]

## 2023-10-21 ENCOUNTER — Other Ambulatory Visit: Payer: Self-pay

## 2023-10-21 NOTE — Progress Notes (Signed)
Specialty Pharmacy Refill Coordination Note  Jonathan Dickson is a 68 y.o. male contacted today regarding refills of specialty medication(s) Tafamidis Jeannie Fend)   Patient requested Delivery   Delivery date: 10/24/23   Verified address: [address]   Medication will be filled on 01.23.25.

## 2023-10-27 ENCOUNTER — Ambulatory Visit (HOSPITAL_COMMUNITY)
Admission: RE | Admit: 2023-10-27 | Discharge: 2023-10-27 | Disposition: A | Discharge status: Home / Self Care | Payer: 59 | Source: Ambulatory Visit | Attending: Family Medicine | Admitting: Family Medicine

## 2023-10-27 ENCOUNTER — Ambulatory Visit (HOSPITAL_BASED_OUTPATIENT_CLINIC_OR_DEPARTMENT_OTHER)
Admission: RE | Admit: 2023-10-27 | Discharge: 2023-10-27 | Disposition: A | Discharge status: Home / Self Care | Payer: 59 | Source: Ambulatory Visit | Attending: Internal Medicine | Admitting: Internal Medicine

## 2023-10-27 VITALS — BP 114/82 | HR 78 | Wt 166.4 lb

## 2023-10-27 DIAGNOSIS — I43 Cardiomyopathy in diseases classified elsewhere: Secondary | ICD-10-CM | POA: Diagnosis not present

## 2023-10-27 DIAGNOSIS — Z86711 Personal history of pulmonary embolism: Secondary | ICD-10-CM | POA: Diagnosis not present

## 2023-10-27 DIAGNOSIS — E119 Type 2 diabetes mellitus without complications: Secondary | ICD-10-CM | POA: Diagnosis not present

## 2023-10-27 DIAGNOSIS — I083 Combined rheumatic disorders of mitral, aortic and tricuspid valves: Secondary | ICD-10-CM | POA: Insufficient documentation

## 2023-10-27 DIAGNOSIS — I5022 Chronic systolic (congestive) heart failure: Secondary | ICD-10-CM

## 2023-10-27 DIAGNOSIS — E854 Organ-limited amyloidosis: Secondary | ICD-10-CM

## 2023-10-27 DIAGNOSIS — I10 Essential (primary) hypertension: Secondary | ICD-10-CM

## 2023-10-27 DIAGNOSIS — I110 Hypertensive heart disease with heart failure: Secondary | ICD-10-CM | POA: Insufficient documentation

## 2023-10-27 DIAGNOSIS — E785 Hyperlipidemia, unspecified: Secondary | ICD-10-CM | POA: Insufficient documentation

## 2023-10-27 LAB — ECHOCARDIOGRAM COMPLETE
AR max vel: 2.56 cm2
AV Area VTI: 2.54 cm2
AV Area mean vel: 2.23 cm2
AV Mean grad: 2 mm[Hg]
AV Peak grad: 3.4 mm[Hg]
Ao pk vel: 0.92 m/s
Area-P 1/2: 4.17 cm2
Calc EF: 33.5 %
MV VTI: 2.58 cm2
S' Lateral: 4.1 cm
Single Plane A2C EF: 34 %
Single Plane A4C EF: 36.5 %

## 2023-10-27 NOTE — Progress Notes (Signed)
*  PRELIMINARY RESULTS* Echocardiogram 2D Echocardiogram has been performed.  Carolyne Fiscal 10/27/2023, 2:58 PM

## 2023-10-27 NOTE — Patient Instructions (Addendum)
Great to see you today!!!  No changes, continue current medications  Your physician recommends that you schedule a follow-up appointment in: 6 months  Do the following things EVERYDAY: Weigh yourself in the morning before breakfast. Write it down and keep it in a log. Take your medicines as prescribed Eat low salt foods--Limit salt (sodium) to 2000 mg per day.  Stay as active as you can everyday Limit all fluids for the day to less than 2 liters  If you have any questions or concerns before your next appointment please send [place] a message through [place] or call our office at [phone].    TO LEAVE A MESSAGE FOR THE NURSE SELECT OPTION 2, PLEASE LEAVE A MESSAGE INCLUDING: YOUR NAME DATE OF BIRTH CALL BACK NUMBER REASON FOR CALL**this is important as we prioritize the call backs  YOU WILL RECEIVE A CALL BACK THE SAME DAY AS LONG AS YOU CALL BEFORE 4:00 PM  At the [facility], you and your health needs are our priority. As part of our continuing mission to provide you with exceptional heart care, we have created designated Provider Care Teams. These Care Teams include your primary Cardiologist (physician) and Advanced Practice Providers (APPs- Physician Assistants and Nurse Practitioners) who all work together to provide you with the care you need, when you need it.   You may see any of the following providers on your designated Care Team at your next follow up: Dr Arvilla Meres Dr Marca Ancona Dr. Dorthula Nettles Dr. Clearnce Hasten Amy Filbert Schilder, NP Robbie Lis, [place] [facility] Isabella, [place] Brynda Peon, NP [place] Lee, NP Karle Plumber, PharmD   Please be sure to bring in all your medications bottles to every appointment.    Thank you for choosing [facility]

## 2023-10-27 NOTE — Progress Notes (Addendum)
 [facility]   PCP: Claiborne Rigg, NP Primary Cardiologist: Dr Allyson Sabal EP: Dr Elberta Fortis HF: Dr Gala Romney  HPI: Jonathan Dickson is a 68 y.o. 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   Had Myoview in 5/17 with EF 22%. Subsequently underwent cath 6/17. EF 40-45%. Normal coronaries   Admitted to [facility] 8/19 with increased dyspnea. Diuresed with IV lasix. CTA performed and showed bilateral PE. Had AKI so diuretics held.  Placed on eliquis for PE.    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  Had u/s in 11/20 with acute LLE DVT. Switched from Eliquis to coumadin. Got COVID PNA over Christmas 12/20. CT chest showed no PE.  Echo 8/22 EF 30-35%. Hydralazine stopped with low BP, also stopped Imdur.  s/p anterior cervical decompression and fusion on 08/14/23   Admitted with PNA 11/24   Today he returns for HF follow up. Says he is doing well. Says as long as he takes his time he doesn't get SOB. 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 Compliant with meds. No CP, edema, orthopnea or PND. Recent labs ok    Cardiac Testing - Echo (8/22): EF 30-35%  - CTA 05/12/2018 --.large bilateral  PE   - CPX 04/03/18 FVC 2.77 (83%)      FEV1 2.37 (91%)        FEV1/FVC 86 (109%)        MVV 76 (61%)  Resting HR: 83 Peak HR: 125   (80% age predicted max HR) BP rest: 144/84 BP peak: 168/72 Peak VO2: 23.1 (76% predicted peak VO2) VE/VCO2 slope:  41 OUES: 2.18 Peak RER: 0.93 VE/MVV:  88% O2pulse:  14   (100% predicted O2pulse)  - PYP scan obtained which was positive (Visual grade 2. Quantitative 1.57).  - cMRI 04/01/18 1. Mild LVE with mild LVH septal thickness 13 mm. Diffuse hypokinesis worse in the septum apex and lateral walls EF 28%  2. Abnormal post gadolinium inversion recovery sequences with failure to null and uptake in the mid/subepicardial septum, inferior wall and lateral walls with some  apical sparing  Findings consistent with amyloidosis or infiltrative cardiomyopathy  - Echo (8/19):EF ~30% (continues to drop in setting of TTR amyloid). RV normal without evidence of acute strain. LE u/s without DVT.  - Echo (4/19): EF 30% to 35% (Dr. Gala Romney felt closer to 40-45%). Mild LVH Diffuse hypokinesis. Grade 1DD  - Echo (12/18): EF 50% to 55%.   Review of systems complete and found to be negative unless listed in HPI.   Past Medical History:  Diagnosis Date   Abnormal TSH 02/2016   AICD (automatic cardioverter/defibrillator) present    St Jude   Anxiety    Blood clotting disorder (HCC)    Bronchiectasis (HCC)    left lower lung   Cardiac amyloidosis (HCC)    Cervical disc disease    Chronic combined systolic and diastolic CHF (congestive heart failure) (HCC)    a. 01/2016 Echo: EF 25%, inf AK, diffuse sev HK, Gr1 DD, mild MR.   Coronary atherosclerosis    Depression    Diabetes mellitus without complication (HCC)    type 2   Dyspnea    with exertion   Dysrhythmia    Full thickness rotator cuff tear 06/2021   MRI   History of DVT (deep vein thrombosis) 08/04/2019   venous doppler US   History of hiatal hernia  Hx of adenomatous polyp of colon    Hyperglycemia    Hyperlipidemia    Hypertension    Hypertensive heart disease    Myocardial infarction [facility])    greater than 5 yrs per patient on 08/06/23   NICM (nonischemic cardiomyopathy) (HCC)    a. 01/2016 Echo: EF 25%, inf AK, diffuse sev HK, Gr1 DD, mild MR; b. 01/2016 MV: EF 22%, no isch/infarct;  c. 02/2016 Cath: Nl cors, EF 35-45%.   Osteoarthritis of hips, bilateral    and hands   Paraseptal emphysema (HCC)    mild   Peanut allergy    Pneumonia    x 1   Pre-diabetes    hx but now DM 2   Presence of implantable cardioverter-defibrillator (ICD)    St Jude   S/P repair of ventral hernia    Seizures (HCC)    allergic with penicillin - only one seizure   Sensorineural hearing loss 11/25/2017   no  hearing aids, patient denies this dx   Substance abuse (HCC)    none in 20 yrs per patient as of 08/06/23   Current Outpatient Medications  Medication Sig Dispense Refill   acetaminophen (TYLENOL) 325 MG tablet Take 2 tablets (650 mg total) by mouth every 6 (six) hours as needed for mild pain (pain score 1-3) (or Fever >/= 101).     albuterol (VENTOLIN HFA) 108 (90 Base) MCG/ACT inhaler INHALE 2 PUFFS INTO THE LUNGS EVERY 6 HOURS AS NEEDED FOR WHEEZING OR SHORTNESS OF BREATH 6.7 g 3   Aromatic Inhalants (VICKS VAPOINHALER) INHA Inhale 1 Dose into the lungs daily as needed (congestion).     atorvastatin (LIPITOR) 80 MG tablet Take 1 tablet (80 mg total) by mouth daily. 30 tablet 2   carvedilol (COREG) 12.5 MG tablet Take 1 tablet (12.5 mg total) by mouth 2 (two) times daily with a meal. NEEDS FOLLOW UP APPOINTMENT FOR MORE REFILLS 180 tablet 0   Chlorphen-Phenyleph-ASA (ALKA-SELTZER PLUS COLD PO) Take 1-2 tablets by mouth 2 (two) times daily as needed (cold symptoms).      cholecalciferol (VITAMIN D3) 25 MCG (1000 UNIT) tablet Take 1,000 Units by mouth daily.     dapagliflozin propanediol (FARXIGA) 10 MG TABS tablet Take 1 tablet (10 mg total) by mouth daily. 30 tablet 2   dicyclomine (BENTYL) 20 MG tablet Take 1 tablet (20 mg total) by mouth 2 (two) times daily. 10 tablet 0   enoxaparin (LOVENOX) 80 MG/0.8ML injection Inject 80 mg into the skin every 12 (twelve) hours.     fluticasone (FLONASE) 50 MCG/ACT nasal spray SHAKE LIQUID AND USE 1 SPRAY IN EACH NOSTRIL DAILY (Patient taking differently: Place 1 spray into both nostrils daily.) 16 g 1   gabapentin (NEURONTIN) 300 MG capsule Take 300 mg by mouth daily as needed.     HYDROcodone-acetaminophen (NORCO/VICODIN) 5-325 MG tablet Take 1 tablet by mouth every 6 (six) hours as needed for moderate pain (pain score 4-6) or severe pain (pain score 7-10). 20 tablet 0   metFORMIN (GLUCOPHAGE) 500 MG tablet TAKE 1 TABLET(500 MG) BY MOUTH DAILY WITH  BREAKFAST (Patient taking differently: Take 500 mg by mouth daily with breakfast.) 90 tablet 0   methocarbamol (ROBAXIN) 500 MG tablet Take 1-2 tablets (500-1,000 mg total) by mouth every 8 (eight) hours as needed for muscle spasms. 30 tablet 0   Misc. Devices MISC by Does not apply route. Flutter valve daily     sacubitril-valsartan (ENTRESTO) 49-51 MG Take 1 tablet by  mouth 2 (two) times daily. NEEDS FOLLOW UP APPOINTMENT FOR MORE REFILLS 180 tablet 0   spironolactone (ALDACTONE) 25 MG tablet TAKE 1/2 TABLET(12.5 MG) BY MOUTH DAILY 15 tablet 3   Tafamidis (VYNDAMAX) 61 MG CAPS Take 1 capsule by mouth daily. 30 capsule 11   warfarin (COUMADIN) 5 MG tablet TAKE 1 TO 2 TABLETS BY MOUTH DAILY AS DIRECTED BY COUMADIN CLINIC 150 tablet 1   No current facility-administered medications for this encounter.   Allergies  Allergen Reactions   Peanut-Containing Drug Products Swelling   Penicillins Other (See Comments)    CONVULSIONS    Decadron [Dexamethasone] Itching   Ivp Dye [Iodinated Contrast Media]    Social History   Socioeconomic History   Marital status: Legally Separated    Spouse name: Not on file   Number of children: 3   Years of education: Not on file   Highest education level: 10th grade  Occupational History   Not on file  Tobacco Use   Smoking status: Former    Current packs/day: 0.00    Average packs/day: 1.5 packs/day for 18.0 years (27.0 ttl pk-yrs)    Types: Cigarettes    Start date: 03/14/1985    Quit date: 03/15/2003    Years since quitting: 20.6   Smokeless tobacco: Never  Vaping Use   Vaping status: Never Used  Substance and Sexual Activity   Alcohol use: No    Comment: former, none since 2004   Drug use: Yes    Types: Heroin, Marijuana, "Crack" cocaine    Comment: former, none since 2004   Sexual activity: Not Currently  Other Topics Concern   Not on file  Social History Narrative   Not on file   Social Drivers of Health   Financial Resource Strain:  Low Risk  (05/06/2023)   Overall Financial Resource Strain (CARDIA)    Difficulty of Paying Living Expenses: Not hard at all  Food Insecurity: No Food Insecurity (08/21/2023)   Hunger Vital Sign    Worried About Running Out of Food in the Last Year: Never true    Ran Out of Food in the Last Year: Never true  Transportation Needs: No Transportation Needs (08/21/2023)   PRAPARE - Administrator, Civil Service (Medical): No    Lack of Transportation (Non-Medical): No  Physical Activity: Insufficiently Active (05/06/2023)   Exercise Vital Sign    Days of Exercise per Week: 3 days    Minutes of Exercise per Session: 30 min  Stress: No Stress Concern Present (05/06/2023)   [employer] of Occupational Health - Occupational Stress Questionnaire    Feeling of Stress : Not at all  Social Connections: Moderately Isolated (05/06/2023)   Social Connection and Isolation Panel [NHANES]    Frequency of Communication with Friends and Family: More than three times a week    Frequency of Social Gatherings with Friends and Family: Once a week    Attends Religious Services: 1 to 4 times per year    Active Member of [employer] or Organizations: No    Attends Banker Meetings: Never    Marital Status: Widowed  Intimate Partner Violence: Not At Risk (08/21/2023)   Humiliation, Afraid, Rape, and Kick questionnaire    Fear of Current or Ex-Partner: No    Emotionally Abused: No    Physically Abused: No    Sexually Abused: No    Family History  Problem Relation Age of Onset   Diabetes Mother    Diabetes  Sister    Clotting disorder Neg Hx    Lung disease Neg Hx    Colon cancer Neg Hx    Esophageal cancer Neg Hx    Stomach cancer Neg Hx    Pancreatic cancer Neg Hx    Colon polyps Neg Hx    Rectal cancer Neg Hx    BP 114/82   Pulse 78   Wt 75.5 kg (166 lb 6.4 oz)   SpO2 99%   BMI 26.86 kg/m   Wt Readings from Last 3 Encounters:  10/27/23 75.5 kg (166 lb 6.4 oz)  10/06/23 75  kg (165 lb 4.8 oz)  09/04/23 73.3 kg (161 lb 9.6 oz)   PHYSICAL EXAM: General:  Well appearing. No resp difficulty HEENT: normal Neck: supple. no JVD. Carotids 2+ bilat; no bruits. No lymphadenopathy or thryomegaly appreciated. Cor: PMI nondisplaced. Regular rate & rhythm. No rubs, gallops or murmurs. Lungs: clear Abdomen: soft, nontender, nondistended. No hepatosplenomegaly. No bruits or masses. Good bowel sounds. Extremities: no cyanosis, clubbing, rash, edema Neuro: alert & orientedx3, cranial nerves grossly intact. moves all 4 extremities w/o difficulty. Affect pleasant   ICD interrogation (personally reviewed): No VT/AF. Volume status looks good. Personally reviewed  ASSESSMENT & PLAN:  1. Chronic Systolic Heart Failure, NICM in setting of wtTTR cardiac amyloidosis - EF dropped from 50-55%-->04/2018 25-30%  - Cath 6/17 normal cors. - cMRI (7/19): LVEF 28% inability to blank myocardium c/w amyloid  - PYP and cMRI testing strongly suggestive of TTR amyloid. No M-spike, normal IFE on myeloma panel.  - Genetic testing negative  - Repeat PYP (2/21). 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 - Stable. NYHA II. Volume status stable. - Continue Lasix 40 mg daily PRN. Has not needed any recently. - On 4-drug GDMT as below - Continue Farxiga 10 mg daily. - Continue carvedilol 12.5 mg bid. - Continue Entresto 49/51 mg bid.  - Continue spiro 12.5 mg daily. - Recent labs from 10/06/23 reviewed K 4.5 [place] 1.14  2. DMII - Stable. On metformin - On SGLT2i. - Managed per PCP  3. HTN -Blood pressure well controlled. Continue current regimen.  4. PE - CTA 04/2018 with large bilateral PE. - Was on Eliquis. Now back on warfarin due to acute LLE DVT in 11/20. - CT chest 12/20 no PE. - On lifelong AC managed by INR  Clinic - No bleeding  Arvilla Meres, MD  10/27/23

## 2023-10-28 ENCOUNTER — Other Ambulatory Visit: Payer: Self-pay | Admitting: Nurse Practitioner

## 2023-10-28 DIAGNOSIS — E119 Type 2 diabetes mellitus without complications: Secondary | ICD-10-CM

## 2023-10-28 NOTE — Telephone Encounter (Signed)
Requested medication (s) are due for refill today: yes   Requested medication (s) are on the active medication list: yes   Last refill:  08/01/23 #90 0 refills  Future visit scheduled: no   Notes to clinic:  no refills remain. Do you want to refill Rx?     Requested Prescriptions  Pending Prescriptions Disp Refills   metFORMIN (GLUCOPHAGE) 500 MG tablet [Pharmacy Med Name: METFORMIN 500MG  TABLETS] 90 tablet 0    Sig: TAKE 1 TABLET(500 MG) BY MOUTH DAILY WITH BREAKFAST     Endocrinology:  Diabetes - Biguanides Passed - 10/28/2023  2:40 PM      Passed - Cr in normal range and within 360 days    Creatinine  Date Value Ref Range Status  10/06/2023 1.14 0.61 - 1.24 mg/dL Final   Creat  Date Value Ref Range Status  03/21/2016 0.98 0.70 - 1.25 mg/dL Final    Comment:      For patients > or = 69 years of age: The upper reference limit for Creatinine is approximately 13% higher for people identified as African-American.            Passed - HBA1C is between 0 and 7.9 and within 180 days    HbA1c, POC (controlled diabetic range)  Date Value Ref Range Status  08/13/2022 6.4 0.0 - 7.0 % Final   Hgb A1c MFr Bld  Date Value Ref Range Status  08/18/2023 6.5 (H) 4.8 - 5.6 % Final    Comment:    (NOTE) Pre diabetes:          5.7%-6.4%  Diabetes:              >6.4%  Glycemic control for   <7.0% adults with diabetes          Passed - eGFR in normal range and within 360 days    GFR, Est African American  Date Value Ref Range Status  10/04/2015 >89 >=60 mL/min Final   GFR calc Af Amer  Date Value Ref Range Status  07/21/2020 62 >59 mL/min/1.73 Final    Comment:    **In accordance with recommendations from the NKF-ASN Task force,**   [facility] is in the process of updating its eGFR calculation to the   2021 CKD-EPI creatinine equation that estimates kidney function   without a race variable.    GFR, Est Non African American  Date Value Ref Range Status  10/04/2015 >89  >=60 mL/min Final    Comment:      The estimated GFR is a calculation valid for adults (>=5 years old) that uses the CKD-EPI algorithm to adjust for age and sex. It is   not to be used for children, pregnant women, hospitalized patients,    patients on dialysis, or with rapidly changing kidney function. According to the NKDEP, eGFR >89 is normal, 60-89 shows mild impairment, 30-59 shows moderate impairment, 15-29 shows severe impairment and <15 is ESRD.      GFR, Estimated  Date Value Ref Range Status  10/06/2023 >60 >60 mL/min Final    Comment:    (NOTE) Calculated using the CKD-EPI Creatinine Equation (2021)    eGFR  Date Value Ref Range Status  02/05/2023 65 >59 mL/min/1.73 Final         Passed - B12 Level in normal range and within 720 days    Vitamin B-12  Date Value Ref Range Status  10/06/2023 191 180 - 914 pg/mL Final    Comment:    (  NOTE) This assay is not validated for testing neonatal or myeloproliferative syndrome specimens for Vitamin B12 levels. Performed at [facility], [address], [place], [place] [address]          Passed - Valid encounter within last 6 months    Recent Outpatient Visits           1 month ago Community acquired pneumonia, unspecified laterality   [facility] [place]. [facility] Hoy Register, MD   8 months ago Primary hypertension   [facility] [place]. [facility] Claiborne Rigg, NP   1 year ago Encounter for annual physical exam   [facility] [place]. [facility] Claiborne Rigg, NP   1 year ago Encounter to establish care   [facility] [place]. [facility] [place], [place] W, NP   1 year ago Type 2 diabetes mellitus without complication, without long-term current use of insulin (HCC)   [facility]  [place]. [facility] [place], Marylene Land M, [place]              Passed - CBC within normal limits and completed in the last 12 months    WBC  Date Value Ref Range Status  08/18/2023 7.0 4.0 - 10.5 K/uL Final   WBC Count  Date Value Ref Range Status  10/06/2023 5.8 4.0 - 10.5 K/uL Final   RBC  Date Value Ref Range Status  10/06/2023 5.63 4.22 - 5.81 MIL/uL Final   Hemoglobin  Date Value Ref Range Status  10/06/2023 15.0 13.0 - 17.0 g/dL Final  28/41/3244 01.0 (L) 13.0 - 17.7 g/dL Final   HCT  Date Value Ref Range Status  10/06/2023 46.0 39.0 - 52.0 % Final   Hematocrit  Date Value Ref Range Status  02/05/2023 42.3 37.5 - 51.0 % Final   MCHC  Date Value Ref Range Status  10/06/2023 32.6 30.0 - 36.0 g/dL Final   [facility]  Date Value Ref Range Status  10/06/2023 26.6 26.0 - 34.0 pg Final   MCV  Date Value Ref Range Status  10/06/2023 81.7 80.0 - 100.0 fL Final  02/05/2023 79 79 - 97 fL Final   No results found for: "PLTCOUNTKUC", "LABPLAT", "POCPLA" RDW  Date Value Ref Range Status  10/06/2023 21.0 (H) 11.5 - 15.5 % Final  02/05/2023 17.3 (H) 11.6 - 15.4 % Final

## 2023-10-30 NOTE — Telephone Encounter (Signed)
error

## 2023-11-04 ENCOUNTER — Other Ambulatory Visit (HOSPITAL_COMMUNITY): Payer: Self-pay | Admitting: Family Medicine

## 2023-11-11 ENCOUNTER — Other Ambulatory Visit (HOSPITAL_COMMUNITY): Payer: Self-pay | Admitting: Family Medicine

## 2023-11-11 DIAGNOSIS — I10 Essential (primary) hypertension: Secondary | ICD-10-CM

## 2023-11-11 DIAGNOSIS — Z86711 Personal history of pulmonary embolism: Secondary | ICD-10-CM

## 2023-11-11 DIAGNOSIS — I428 Other cardiomyopathies: Secondary | ICD-10-CM

## 2023-11-14 ENCOUNTER — Other Ambulatory Visit: Payer: Self-pay

## 2023-11-14 ENCOUNTER — Other Ambulatory Visit (HOSPITAL_COMMUNITY): Payer: Self-pay | Admitting: Internal Medicine

## 2023-11-14 ENCOUNTER — Other Ambulatory Visit (HOSPITAL_COMMUNITY): Payer: Self-pay

## 2023-11-14 MED ORDER — VYNDAMAX 61 MG PO CAPS
1.0000 | ORAL_CAPSULE | Freq: Every day | ORAL | 0 refills | Status: DC
  Filled 2023-11-14: qty 30, 30d supply, fill #0

## 2023-11-14 NOTE — Progress Notes (Signed)
Specialty Pharmacy Refill Coordination Note  Jonathan Dickson is a 68 y.o. male contacted today regarding refills of specialty medication(s) Tafamidis Jeannie Fend)   Patient requested Delivery   Delivery date: 11/21/23   Verified address: [address]   Medication will be filled on 11/20/23.    This fill date is pending response to refill request from provider. Patient is aware and if they have not received fill by intended date they must follow up with pharmacy.

## 2023-11-16 NOTE — Addendum Note (Signed)
 Encounter addended by: Dolores Patty, MD on: 11/16/2023 10:12 AM  Actions taken: Clinical Note Signed, Level of Service modified, Visit diagnoses modified, Charge Capture section accepted

## 2023-11-18 ENCOUNTER — Ambulatory Visit: Payer: 59 | Attending: Cardiovascular Disease

## 2023-11-18 DIAGNOSIS — I824Y9 Acute embolism and thrombosis of unspecified deep veins of unspecified proximal lower extremity: Secondary | ICD-10-CM

## 2023-11-18 DIAGNOSIS — Z7901 Long term (current) use of anticoagulants: Secondary | ICD-10-CM

## 2023-11-18 LAB — POCT INR: INR: 2.6 (ref 2.0–3.0)

## 2023-11-18 NOTE — Patient Instructions (Signed)
 continue taking warfarin 1 tablet (5mg ) daily, except 2 tablets (10mg ) on Monday, Wednesday and Friday. Recheck INR in 6 weeks. Coumadin Clinic [phone] Procedure Clearance Fax [phone] or [phone]

## 2023-11-20 ENCOUNTER — Other Ambulatory Visit (HOSPITAL_COMMUNITY): Payer: Self-pay

## 2023-11-20 ENCOUNTER — Other Ambulatory Visit: Payer: Self-pay

## 2023-11-20 ENCOUNTER — Telehealth (HOSPITAL_COMMUNITY): Payer: Self-pay | Admitting: Pharmacy Technician

## 2023-11-20 NOTE — Telephone Encounter (Signed)
 Patient Advocate Encounter   Received notification from [facility] that prior authorization for Vyndamax is required.   PA submitted on CoverMyMeds Key [id number] Status is pending   Will continue to follow.

## 2023-11-20 NOTE — Telephone Encounter (Signed)
 Advanced Heart Failure Patient Advocate Encounter  Prior Authorization for Jonathan Dickson has been approved.    PA# [id number] Effective dates: 11/20/23 through 09/29/24  Jonathan Dickson, CPhT

## 2023-12-09 ENCOUNTER — Other Ambulatory Visit (HOSPITAL_COMMUNITY): Payer: Self-pay

## 2023-12-09 ENCOUNTER — Other Ambulatory Visit: Payer: Self-pay

## 2023-12-09 DIAGNOSIS — I5022 Chronic systolic (congestive) heart failure: Secondary | ICD-10-CM

## 2023-12-09 MED ORDER — DAPAGLIFLOZIN PROPANEDIOL 10 MG PO TABS
10.0000 mg | ORAL_TABLET | Freq: Every day | ORAL | 5 refills | Status: DC
Start: 2023-12-09 — End: 2024-06-01

## 2023-12-11 ENCOUNTER — Other Ambulatory Visit: Payer: Self-pay

## 2023-12-11 ENCOUNTER — Other Ambulatory Visit (HOSPITAL_COMMUNITY): Payer: Self-pay

## 2023-12-11 ENCOUNTER — Other Ambulatory Visit (HOSPITAL_COMMUNITY): Payer: Self-pay | Admitting: Internal Medicine

## 2023-12-11 MED ORDER — VYNDAMAX 61 MG PO CAPS
1.0000 | ORAL_CAPSULE | Freq: Every day | ORAL | 3 refills | Status: AC
  Filled 2023-12-11: qty 30, 30d supply, fill #0
  Filled 2024-01-26: qty 30, 30d supply, fill #1
  Filled 2024-02-25: qty 30, 30d supply, fill #2
  Filled 2024-03-19 – 2024-03-23 (×2): qty 30, 30d supply, fill #3
  Filled 2024-04-21: qty 30, 30d supply, fill #4
  Filled 2024-05-27: qty 30, 30d supply, fill #5
  Filled 2024-06-28: qty 30, 30d supply, fill #6
  Filled 2024-07-27: qty 30, 30d supply, fill #7
  Filled 2024-08-25: qty 30, 30d supply, fill #8
  Filled 2024-09-21 – 2024-09-22 (×2): qty 30, 30d supply, fill #9
  Filled 2024-10-20: qty 30, 30d supply, fill #10

## 2023-12-11 NOTE — Progress Notes (Signed)
 Specialty Pharmacy Refill Coordination Note  Jonathan Dickson is a 68 y.o. male contacted today regarding refills of specialty medication(s) Tafamidis Jeannie Fend)   Patient requested Delivery   Delivery date: 12/26/23   Verified address: [address]   Medication will be filled on 12/25/23, Pending refill approval.     Refills may not be sent until after 12/23/23 appointment.

## 2023-12-17 ENCOUNTER — Other Ambulatory Visit: Payer: Self-pay | Admitting: Adult Health

## 2023-12-17 ENCOUNTER — Other Ambulatory Visit (HOSPITAL_COMMUNITY): Payer: Self-pay | Admitting: Family Medicine

## 2023-12-17 DIAGNOSIS — J479 Bronchiectasis, uncomplicated: Secondary | ICD-10-CM

## 2023-12-17 DIAGNOSIS — I10 Essential (primary) hypertension: Secondary | ICD-10-CM

## 2023-12-17 DIAGNOSIS — Z86711 Personal history of pulmonary embolism: Secondary | ICD-10-CM

## 2023-12-17 DIAGNOSIS — I428 Other cardiomyopathies: Secondary | ICD-10-CM

## 2023-12-22 ENCOUNTER — Other Ambulatory Visit (HOSPITAL_COMMUNITY): Payer: Self-pay

## 2023-12-22 DIAGNOSIS — I5022 Chronic systolic (congestive) heart failure: Secondary | ICD-10-CM

## 2023-12-22 MED ORDER — ENTRESTO 49-51 MG PO TABS: 1.0000 | ORAL_TABLET | Freq: Two times a day (BID) | ORAL | 5 refills | Status: DC

## 2023-12-23 ENCOUNTER — Ambulatory Visit (INDEPENDENT_AMBULATORY_CARE_PROVIDER_SITE_OTHER): Payer: 59

## 2023-12-23 DIAGNOSIS — I428 Other cardiomyopathies: Secondary | ICD-10-CM | POA: Diagnosis not present

## 2023-12-23 LAB — CUP PACEART REMOTE DEVICE CHECK
Battery Remaining Longevity: 53 mo
Battery Remaining Percentage: 51 %
Battery Voltage: 2.95 V
Brady Statistic RV Percent Paced: 1 %
Date Time Interrogation Session: 20250325020020
HighPow Impedance: 65 Ohm
HighPow Impedance: 65 Ohm
Implantable Lead Connection Status: 753985
Implantable Lead Implant Date: 20190918
Implantable Lead Location: 753860
Implantable Lead Model: 7122
Implantable Pulse Generator Implant Date: 20190918
Lead Channel Impedance Value: 440 Ohm
Lead Channel Pacing Threshold Amplitude: 0.75 V
Lead Channel Pacing Threshold Pulse Width: 0.5 ms
Lead Channel Sensing Intrinsic Amplitude: 12 mV
Lead Channel Setting Pacing Amplitude: 2.5 V
Lead Channel Setting Pacing Pulse Width: 0.5 ms
Lead Channel Setting Sensing Sensitivity: 0.5 mV
Pulse Gen Serial Number: 9796641

## 2023-12-25 ENCOUNTER — Other Ambulatory Visit (HOSPITAL_COMMUNITY): Payer: Self-pay

## 2023-12-25 ENCOUNTER — Other Ambulatory Visit: Payer: Self-pay

## 2023-12-30 ENCOUNTER — Ambulatory Visit: Payer: 59 | Attending: Cardiology | Admitting: *Deleted

## 2023-12-30 DIAGNOSIS — I824Y9 Acute embolism and thrombosis of unspecified deep veins of unspecified proximal lower extremity: Secondary | ICD-10-CM

## 2023-12-30 DIAGNOSIS — Z7901 Long term (current) use of anticoagulants: Secondary | ICD-10-CM | POA: Diagnosis not present

## 2023-12-30 LAB — POCT INR: INR: 3.4 — AB (ref 2.0–3.0)

## 2023-12-30 NOTE — Patient Instructions (Signed)
 Description   Do not take any warfarin tomorrow then continue taking warfarin 1 tablet (5mg ) daily, except 2 tablets (10mg ) on Monday, Wednesday and Friday. Recheck INR in 5 weeks. Coumadin Clinic [phone] Procedure Clearance Fax [phone] or [phone]     New Address: [address] [place] [address]

## 2024-01-06 ENCOUNTER — Encounter: Payer: Self-pay | Admitting: Podiatry

## 2024-01-06 ENCOUNTER — Ambulatory Visit (INDEPENDENT_AMBULATORY_CARE_PROVIDER_SITE_OTHER): Admitting: Podiatry

## 2024-01-06 DIAGNOSIS — B351 Tinea unguium: Secondary | ICD-10-CM | POA: Diagnosis not present

## 2024-01-06 DIAGNOSIS — M79674 Pain in right toe(s): Secondary | ICD-10-CM | POA: Diagnosis not present

## 2024-01-06 DIAGNOSIS — E119 Type 2 diabetes mellitus without complications: Secondary | ICD-10-CM | POA: Diagnosis not present

## 2024-01-06 DIAGNOSIS — M79675 Pain in left toe(s): Secondary | ICD-10-CM

## 2024-01-06 NOTE — Progress Notes (Signed)
This patient returns to my office for at risk foot care.  This patient requires this care by a professional since this patient will be at risk due to having coagulation defect, diabetes and peripheral claudication.  Patient is taking coumadin.  This patient is unable to cut nails himself since the patient cannot reach his nails.These nails are painful walking and wearing shoes.  This patient presents for at risk foot care today.  General Appearance  Alert, conversant and in no acute stress.  Vascular  Dorsalis pedis and posterior tibial  pulses are weakly palpable  bilaterally.  Capillary return is within normal limits  bilaterally. Cold feet bilaterally. Absent digital hair.  Neurologic  Senn-Weinstein monofilament wire test within normal limits  bilaterally. Muscle power within normal limits bilaterally.  Nails Thick disfigured discolored nails with subungual debris  from hallux to fifth toes bilaterally. No evidence of bacterial infection or drainage bilaterally.  Orthopedic  No limitations of motion  feet .  No crepitus or effusions noted.  No bony pathology or digital deformities noted.  Skin  normotropic skin with no porokeratosis noted bilaterally.  No signs of infections or ulcers noted.     Onychomycosis  Pain in right toes  Pain in left toes  Consent was obtained for treatment procedures.   Mechanical debridement of nails 1-5  bilaterally performed with a nail nipper.  Filed with dremel without incident.    Return office visit   3  months                   Told patient to return for periodic foot care and evaluation due to potential at risk complications.   Gardiner Barefoot DPM

## 2024-01-08 ENCOUNTER — Other Ambulatory Visit: Payer: Self-pay | Admitting: Adult Health

## 2024-01-08 DIAGNOSIS — J479 Bronchiectasis, uncomplicated: Secondary | ICD-10-CM

## 2024-01-22 ENCOUNTER — Other Ambulatory Visit: Payer: Self-pay

## 2024-01-26 ENCOUNTER — Other Ambulatory Visit (HOSPITAL_COMMUNITY): Payer: Self-pay

## 2024-01-26 ENCOUNTER — Other Ambulatory Visit: Payer: Self-pay

## 2024-01-26 ENCOUNTER — Encounter: Payer: Self-pay | Admitting: Hematology

## 2024-01-26 NOTE — Progress Notes (Signed)
 Specialty Pharmacy Ongoing Clinical Assessment Note  Jonathan Dickson is a 68 y.o. male who is being followed by the specialty pharmacy service for RxSp Cardiology   Patient's specialty medication(s) reviewed today: Tafamidis  (Vyndamax )   Missed doses in the last 4 weeks: 0   Patient/Caregiver did not have any additional questions or concerns.   Therapeutic benefit summary: Patient is achieving benefit   Adverse events/side effects summary: No adverse events/side effects   Patient's therapy is appropriate to: Continue    Goals Addressed             This Visit's Progress    Stabilization of disease   On track    Patient is on track. Patient will maintain adherence         Follow up:  6 months  [name] Specialty Pharmacist

## 2024-01-26 NOTE — Progress Notes (Signed)
 Specialty Pharmacy Refill Coordination Note  Jonathan Dickson is a 68 y.o. male contacted today regarding refills of specialty medication(s) Tafamidis  (Vyndamax )   Patient requested Delivery   Delivery date: 01/27/24   Verified address: [address]   [place] [address]   Medication will be filled on 01/26/24.

## 2024-02-01 ENCOUNTER — Other Ambulatory Visit (HOSPITAL_COMMUNITY): Payer: Self-pay | Admitting: Internal Medicine

## 2024-02-01 DIAGNOSIS — I5022 Chronic systolic (congestive) heart failure: Secondary | ICD-10-CM

## 2024-02-03 ENCOUNTER — Ambulatory Visit: Attending: Internal Medicine

## 2024-02-03 DIAGNOSIS — I824Y9 Acute embolism and thrombosis of unspecified deep veins of unspecified proximal lower extremity: Secondary | ICD-10-CM | POA: Diagnosis not present

## 2024-02-03 DIAGNOSIS — Z7901 Long term (current) use of anticoagulants: Secondary | ICD-10-CM

## 2024-02-03 LAB — POCT INR: INR: 3.9 — AB (ref 2.0–3.0)

## 2024-02-03 NOTE — Patient Instructions (Signed)
 Take only 1 tablet tomorrow then continue taking warfarin 1 tablet (5mg ) daily, except 2 tablets (10mg ) on Monday, Wednesday and Friday. Recheck INR in 5 weeks. Coumadin  Clinic [phone] Procedure Clearance Fax [phone] or [phone]

## 2024-02-06 NOTE — Progress Notes (Signed)
 Remote ICD transmission.

## 2024-02-11 ENCOUNTER — Other Ambulatory Visit: Payer: Self-pay

## 2024-02-12 ENCOUNTER — Other Ambulatory Visit (HOSPITAL_COMMUNITY): Payer: Self-pay

## 2024-02-12 MED ORDER — SPIRONOLACTONE 25 MG PO TABS: 12.5000 mg | ORAL_TABLET | Freq: Every day | ORAL | 3 refills | Status: AC

## 2024-02-13 ENCOUNTER — Other Ambulatory Visit: Payer: Self-pay

## 2024-02-25 ENCOUNTER — Other Ambulatory Visit: Payer: Self-pay

## 2024-02-25 ENCOUNTER — Other Ambulatory Visit (HOSPITAL_COMMUNITY): Payer: Self-pay

## 2024-02-25 NOTE — Progress Notes (Signed)
 LVM. New delivery date is 02/27/24. Will fill medication on 02/26/24.

## 2024-02-25 NOTE — Progress Notes (Signed)
 Specialty Pharmacy Refill Coordination Note  ERRICK SALTS is a 68 y.o. male contacted today regarding refills of specialty medication(s) Vyndamax .  Patient requested (Patient-Rptd) Delivery   Delivery date: (Patient-Rptd) 02/29/24   Verified address: (Patient-Rptd) [address]   Medication will be filled on 03/01/24. New delivery date is 03/02/24. Patient has been notified.

## 2024-02-26 ENCOUNTER — Other Ambulatory Visit: Payer: Self-pay

## 2024-03-09 ENCOUNTER — Ambulatory Visit: Attending: Internal Medicine

## 2024-03-09 DIAGNOSIS — Z7901 Long term (current) use of anticoagulants: Secondary | ICD-10-CM | POA: Diagnosis not present

## 2024-03-09 DIAGNOSIS — I824Y9 Acute embolism and thrombosis of unspecified deep veins of unspecified proximal lower extremity: Secondary | ICD-10-CM

## 2024-03-09 LAB — POCT INR: INR: 2.6 (ref 2.0–3.0)

## 2024-03-09 NOTE — Patient Instructions (Signed)
 continue taking warfarin 1 tablet (5mg ) daily, except 2 tablets (10mg ) on Monday, Wednesday and Friday. Recheck INR in 6 weeks. Coumadin Clinic [phone] Procedure Clearance Fax [phone] or [phone]

## 2024-03-17 ENCOUNTER — Other Ambulatory Visit: Payer: Self-pay

## 2024-03-19 ENCOUNTER — Other Ambulatory Visit: Payer: Self-pay

## 2024-03-23 ENCOUNTER — Other Ambulatory Visit: Payer: Self-pay

## 2024-03-23 ENCOUNTER — Other Ambulatory Visit (HOSPITAL_COMMUNITY): Payer: Self-pay

## 2024-03-23 ENCOUNTER — Ambulatory Visit (INDEPENDENT_AMBULATORY_CARE_PROVIDER_SITE_OTHER): Payer: 59

## 2024-03-23 DIAGNOSIS — I5022 Chronic systolic (congestive) heart failure: Secondary | ICD-10-CM | POA: Diagnosis not present

## 2024-03-23 NOTE — Progress Notes (Signed)
 Specialty Pharmacy Refill Coordination Note  Spoke with Jonathan Dickson (Self).   Jonathan Dickson is a 68 y.o. male contacted today regarding refills of specialty medication(s) Tafamidis  (Vyndamax )  Patient requested: Delivery   Delivery date: 03/26/24   Verified address: [address]   [place] [address]  Medication will be filled on 03/25/24.

## 2024-03-24 ENCOUNTER — Other Ambulatory Visit: Payer: Self-pay

## 2024-03-25 ENCOUNTER — Other Ambulatory Visit: Payer: Self-pay

## 2024-03-25 ENCOUNTER — Ambulatory Visit: Payer: Self-pay | Admitting: Cardiology

## 2024-03-25 LAB — CUP PACEART REMOTE DEVICE CHECK
Battery Remaining Longevity: 52 mo
Battery Remaining Percentage: 50 %
Battery Voltage: 2.95 V
Brady Statistic RV Percent Paced: 1 %
Date Time Interrogation Session: 20250625110040
HighPow Impedance: 69 Ohm
HighPow Impedance: 69 Ohm
Implantable Lead Connection Status: 753985
Implantable Lead Implant Date: 20190918
Implantable Lead Location: 753860
Implantable Lead Model: 7122
Implantable Pulse Generator Implant Date: 20190918
Lead Channel Impedance Value: 410 Ohm
Lead Channel Pacing Threshold Amplitude: 0.75 V
Lead Channel Pacing Threshold Pulse Width: 0.5 ms
Lead Channel Sensing Intrinsic Amplitude: 12 mV
Lead Channel Setting Pacing Amplitude: 2.5 V
Lead Channel Setting Pacing Pulse Width: 0.5 ms
Lead Channel Setting Sensing Sensitivity: 0.5 mV
Pulse Gen Serial Number: 9796641

## 2024-04-05 ENCOUNTER — Other Ambulatory Visit: Payer: 59

## 2024-04-06 ENCOUNTER — Telehealth: Payer: 59 | Admitting: Hematology

## 2024-04-09 ENCOUNTER — Encounter: Payer: Self-pay | Admitting: Podiatrist

## 2024-04-09 ENCOUNTER — Ambulatory Visit (INDEPENDENT_AMBULATORY_CARE_PROVIDER_SITE_OTHER): Admitting: Podiatrist

## 2024-04-09 ENCOUNTER — Other Ambulatory Visit: Payer: Self-pay

## 2024-04-09 DIAGNOSIS — B351 Tinea unguium: Secondary | ICD-10-CM | POA: Diagnosis not present

## 2024-04-09 DIAGNOSIS — M79674 Pain in right toe(s): Secondary | ICD-10-CM

## 2024-04-09 DIAGNOSIS — D500 Iron deficiency anemia secondary to blood loss (chronic): Secondary | ICD-10-CM

## 2024-04-09 DIAGNOSIS — E538 Deficiency of other specified B group vitamins: Secondary | ICD-10-CM

## 2024-04-09 DIAGNOSIS — E119 Type 2 diabetes mellitus without complications: Secondary | ICD-10-CM

## 2024-04-09 DIAGNOSIS — M79675 Pain in left toe(s): Secondary | ICD-10-CM

## 2024-04-09 NOTE — Progress Notes (Unsigned)
 This patient returns to my office for at risk foot care.  This patient requires this care by a professional since this patient will be at risk due to having coagulation defect, diabetes and peripheral claudication.  Patient is taking coumadin .  This patient is unable to cut nails himself since the patient cannot reach his nails.These nails are painful walking and wearing shoes.  This patient presents for at risk foot care today.  General Appearance  Alert, conversant and in no acute stress.  Vascular  Dorsalis pedis and posterior tibial  pulses are weakly palpable  bilaterally.  Capillary return is within normal limits  bilaterally. Cold feet bilaterally. Absent digital hair.  Neurologic  Senn-Weinstein monofilament wire test within normal limits  bilaterally. Muscle power within normal limits bilaterally.  Nails Thick disfigured discolored nails with subungual debris  from hallux to fifth toes bilaterally. No evidence of bacterial infection or drainage bilaterally.  Orthopedic  No limitations of motion  feet .  No crepitus or effusions noted.  No bony pathology or digital deformities noted.  Skin  normotropic skin with no porokeratosis noted bilaterally.  No signs of infections or ulcers noted.     Onychomycosis  Pain in right toes  Pain in left toes  Consent was obtained for treatment procedures.   Mechanical debridement of nails 1-5  bilaterally performed with a nail nipper.  Filed with dremel without incident.    Return office visit   3    months                   Told patient to return for periodic foot care and evaluation due to potential at risk complications.   Cordella Bold DPM

## 2024-04-12 ENCOUNTER — Inpatient Hospital Stay: Attending: Hematology

## 2024-04-12 DIAGNOSIS — I2699 Other pulmonary embolism without acute cor pulmonale: Secondary | ICD-10-CM | POA: Insufficient documentation

## 2024-04-12 DIAGNOSIS — D509 Iron deficiency anemia, unspecified: Secondary | ICD-10-CM | POA: Diagnosis present

## 2024-04-12 DIAGNOSIS — E538 Deficiency of other specified B group vitamins: Secondary | ICD-10-CM | POA: Diagnosis not present

## 2024-04-12 DIAGNOSIS — D500 Iron deficiency anemia secondary to blood loss (chronic): Secondary | ICD-10-CM

## 2024-04-12 LAB — CBC WITH DIFFERENTIAL (CANCER CENTER ONLY)
Abs Immature Granulocytes: 0.01 K/uL (ref 0.00–0.07)
Basophils Absolute: 0.1 K/uL (ref 0.0–0.1)
Basophils Relative: 1 %
Eosinophils Absolute: 0.2 K/uL (ref 0.0–0.5)
Eosinophils Relative: 4 %
HCT: 42.6 % (ref 39.0–52.0)
Hemoglobin: 13.9 g/dL (ref 13.0–17.0)
Immature Granulocytes: 0 %
Lymphocytes Relative: 30 %
Lymphs Abs: 1.7 K/uL (ref 0.7–4.0)
MCH: 27.3 pg (ref 26.0–34.0)
MCHC: 32.6 g/dL (ref 30.0–36.0)
MCV: 83.5 fL (ref 80.0–100.0)
Monocytes Absolute: 0.4 K/uL (ref 0.1–1.0)
Monocytes Relative: 6 %
Neutro Abs: 3.4 K/uL (ref 1.7–7.7)
Neutrophils Relative %: 59 %
Platelet Count: 194 K/uL (ref 150–400)
RBC: 5.1 MIL/uL (ref 4.22–5.81)
RDW: 14.3 % (ref 11.5–15.5)
WBC Count: 5.7 K/uL (ref 4.0–10.5)
nRBC: 0 % (ref 0.0–0.2)

## 2024-04-12 LAB — IRON AND IRON BINDING CAPACITY (CC-WL,HP ONLY)
Iron: 45 ug/dL (ref 45–182)
Saturation Ratios: 12 % — ABNORMAL LOW (ref 17.9–39.5)
TIBC: 368 ug/dL (ref 250–450)
UIBC: 323 ug/dL (ref 117–376)

## 2024-04-12 LAB — CMP (CANCER CENTER ONLY)
ALT: 13 U/L (ref 0–44)
AST: 22 U/L (ref 15–41)
Albumin: 3.9 g/dL (ref 3.5–5.0)
Alkaline Phosphatase: 79 U/L (ref 38–126)
Anion gap: 8 (ref 5–15)
BUN: 17 mg/dL (ref 8–23)
CO2: 21 mmol/L — ABNORMAL LOW (ref 22–32)
Calcium: 9.4 mg/dL (ref 8.9–10.3)
Chloride: 108 mmol/L (ref 98–111)
Creatinine: 1.09 mg/dL (ref 0.61–1.24)
GFR, Estimated: >60 mL/min (ref >60)
Glucose, Bld: 111 mg/dL — ABNORMAL HIGH (ref 70–99)
Potassium: 4.3 mmol/L (ref 3.5–5.1)
Sodium: 137 mmol/L (ref 135–145)
Total Bilirubin: 0.3 mg/dL (ref 0.0–1.2)
Total Protein: 6.7 g/dL (ref 6.5–8.1)

## 2024-04-12 LAB — FERRITIN: Ferritin: 25 ng/mL (ref 24–336)

## 2024-04-12 LAB — VITAMIN B12: Vitamin B-12: 204 pg/mL (ref 180–914)

## 2024-04-13 ENCOUNTER — Inpatient Hospital Stay (HOSPITAL_BASED_OUTPATIENT_CLINIC_OR_DEPARTMENT_OTHER): Admitting: Hematology

## 2024-04-13 DIAGNOSIS — E538 Deficiency of other specified B group vitamins: Secondary | ICD-10-CM

## 2024-04-13 DIAGNOSIS — D500 Iron deficiency anemia secondary to blood loss (chronic): Secondary | ICD-10-CM

## 2024-04-13 MED ORDER — B-12 1000 MCG SL SUBL: 1000.0000 ug | SUBLINGUAL_TABLET | Freq: Every day | SUBLINGUAL | 11 refills | Status: DC
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
## 2024-04-14 ENCOUNTER — Telehealth: Payer: Self-pay

## 2024-04-14 NOTE — Telephone Encounter (Signed)
 Dr. Onesimo, patient will be scheduled as soon as possible.  Auth Submission: NO AUTH NEEDED Site of care: Site of care: [facility] Payer: [employer] medicare dual complete Medication & CPT/J Code(s) submitted: Venofer  (Iron  Sucrose) J1756 Diagnosis Code:  Route of submission (phone, fax, portal):  Phone # Fax # Auth type: Buy/Bill PB Units/visits requested: 300mg  x 3 doses Reference number:  Approval from: 04/14/24 to 08/15/24

## 2024-04-15 ENCOUNTER — Encounter: Payer: Self-pay | Admitting: Hematology

## 2024-04-19 ENCOUNTER — Other Ambulatory Visit: Payer: Self-pay

## 2024-04-19 ENCOUNTER — Encounter (HOSPITAL_COMMUNITY): Payer: Self-pay | Admitting: *Deleted

## 2024-04-19 ENCOUNTER — Emergency Department (HOSPITAL_COMMUNITY)
Admission: EM | Admit: 2024-04-19 | Discharge: 2024-04-19 | Disposition: A | Discharge status: Home / Self Care | Attending: Emergency Medicine | Admitting: Emergency Medicine

## 2024-04-19 ENCOUNTER — Ambulatory Visit (INDEPENDENT_AMBULATORY_CARE_PROVIDER_SITE_OTHER): Admitting: Internal Medicine

## 2024-04-19 ENCOUNTER — Emergency Department (HOSPITAL_COMMUNITY)

## 2024-04-19 DIAGNOSIS — M79602 Pain in left arm: Secondary | ICD-10-CM | POA: Insufficient documentation

## 2024-04-19 DIAGNOSIS — Z91040 Latex allergy status: Secondary | ICD-10-CM | POA: Diagnosis not present

## 2024-04-19 DIAGNOSIS — R9431 Abnormal electrocardiogram [ECG] [EKG]: Secondary | ICD-10-CM | POA: Insufficient documentation

## 2024-04-19 DIAGNOSIS — E871 Hypo-osmolality and hyponatremia: Secondary | ICD-10-CM | POA: Diagnosis not present

## 2024-04-19 DIAGNOSIS — I252 Old myocardial infarction: Secondary | ICD-10-CM | POA: Diagnosis not present

## 2024-04-19 DIAGNOSIS — Z7901 Long term (current) use of anticoagulants: Secondary | ICD-10-CM | POA: Diagnosis not present

## 2024-04-19 DIAGNOSIS — Z95810 Presence of automatic (implantable) cardiac defibrillator: Secondary | ICD-10-CM | POA: Diagnosis not present

## 2024-04-19 DIAGNOSIS — I517 Cardiomegaly: Secondary | ICD-10-CM | POA: Diagnosis not present

## 2024-04-19 DIAGNOSIS — I509 Heart failure, unspecified: Secondary | ICD-10-CM | POA: Insufficient documentation

## 2024-04-19 LAB — BASIC METABOLIC PANEL WITH GFR
Anion gap: 9 (ref 5–15)
BUN: 16 mg/dL (ref 8–23)
CO2: 20 mmol/L — ABNORMAL LOW (ref 22–32)
Calcium: 9.2 mg/dL (ref 8.9–10.3)
Chloride: 105 mmol/L (ref 98–111)
Creatinine, Ser: 1.12 mg/dL (ref 0.61–1.24)
GFR, Estimated: >60 mL/min (ref >60)
Glucose, Bld: 117 mg/dL — ABNORMAL HIGH (ref 70–99)
Potassium: 4.8 mmol/L (ref 3.5–5.1)
Sodium: 134 mmol/L — ABNORMAL LOW (ref 135–145)

## 2024-04-19 LAB — CBC
HCT: 44.1 % (ref 39.0–52.0)
Hemoglobin: 14.2 g/dL (ref 13.0–17.0)
MCH: 27.5 pg (ref 26.0–34.0)
MCHC: 32.2 g/dL (ref 30.0–36.0)
MCV: 85.5 fL (ref 80.0–100.0)
Platelets: 227 K/uL (ref 150–400)
RBC: 5.16 MIL/uL (ref 4.22–5.81)
RDW: 14.5 % (ref 11.5–15.5)
WBC: 6.1 K/uL (ref 4.0–10.5)
nRBC: 0 % (ref 0.0–0.2)

## 2024-04-19 LAB — TROPONIN I (HIGH SENSITIVITY)
Troponin I (High Sensitivity): 13 ng/L (ref <18)
Troponin I (High Sensitivity): 11 ng/L (ref <18)

## 2024-04-19 LAB — BRAIN NATRIURETIC PEPTIDE: B Natriuretic Peptide: 69.1 pg/mL (ref 0.0–100.0)

## 2024-04-19 LAB — PROTIME-INR
INR: 2.2 — ABNORMAL HIGH (ref 0.8–1.2)
Prothrombin Time: 25.3 s — ABNORMAL HIGH (ref 11.4–15.2)
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
## 2024-04-19 NOTE — Progress Notes (Signed)
 INR-2.2 Please see anticoagulation encounter

## 2024-04-19 NOTE — ED Provider Notes (Signed)
 [facility]ERGENCY DEPARTMENT AT [facility] Provider Note   CSN: [id number] Arrival date & time: 04/19/24  9866     Patient presents with: Arm Pain   Jonathan Dickson is a 68 y.o. male.   Presents to the emergency department for evaluation of left forearm pain that started a couple of hours ago.  He reports that it is a throbbing pain.  He denies any injury.  Patient reports that the pain feels similar to when he had a heart attack in the past.  Patient reports that he was in [place] when this happened.  He did not receive any stenting.  He reports that he is a heart failure patient, has a defibrillator.  Defibrillator has not fired.       Prior to Admission medications   Medication Sig Start Date End Date Taking? Authorizing Provider  acetaminophen  (TYLENOL ) 325 MG tablet Take 2 tablets (650 mg total) by mouth every 6 (six) hours as needed for mild pain (pain score 1-3) (or Fever >/= 101). 08/19/23   Hongalgi, Anand D, MD  albuterol  (VENTOLIN  HFA) 108 (90 Base) MCG/ACT inhaler INHALE 2 PUFFS INTO THE LUNGS EVERY 6 HOURS AS NEEDED FOR WHEEZING OR SHORTNESS OF BREATH 12/17/23   Parrett, Madelin RAMAN, NP  Aromatic Inhalants (VICKS VAPOINHALER) INHA Inhale 1 Dose into the lungs daily as needed (congestion).    [provider]  atorvastatin  (LIPITOR ) 80 MG tablet Take 1 tablet (80 mg total) by mouth daily. 09/19/23   Bensimhon, Toribio SAUNDERS, MD  carvedilol  (COREG ) 12.5 MG tablet TAKE 1 TABLET(12.5 MG) BY MOUTH TWICE DAILY WITH A MEAL. NEED FOLLOW UP APPOINTMENT FOR ANYMORE REFILLS 12/19/23   Bensimhon, Toribio SAUNDERS, MD  Chlorphen-Phenyleph-ASA (ALKA-SELTZER PLUS COLD PO) Take 1-2 tablets by mouth 2 (two) times daily as needed (cold symptoms).     [provider]  cholecalciferol  (VITAMIN D3) 25 MCG (1000 UNIT) tablet Take 1,000 Units by mouth daily. 11/25/17   [provider]  Cyanocobalamin  (B-12) 1000 MCG SUBL Place 1,000 mcg under the tongue daily. 04/13/24   Kale,  Gautam Kishore, MD  dapagliflozin  propanediol (FARXIGA ) 10 MG TABS tablet Take 1 tablet (10 mg total) by mouth daily. 12/09/23   Bensimhon, Toribio SAUNDERS, MD  dicyclomine  (BENTYL ) 20 MG tablet Take 1 tablet (20 mg total) by mouth 2 (two) times daily. 07/31/23   Kehrli, Kelsey F, PA-C  enoxaparin  (LOVENOX ) 80 MG/0.8ML injection Inject 80 mg into the skin every 12 (twelve) hours.    [provider]  fluticasone  (FLONASE ) 50 MCG/ACT nasal spray SHAKE LIQUID AND USE 1 SPRAY IN EACH NOSTRIL DAILY Patient taking differently: Place 1 spray into both nostrils daily. 08/12/23   Meade Verdon RAMAN, MD  gabapentin  (NEURONTIN ) 300 MG capsule Take 300 mg by mouth daily as needed. 07/07/23   [provider]  HYDROcodone -acetaminophen  (NORCO/VICODIN) 5-325 MG tablet Take 1 tablet by mouth every 6 (six) hours as needed for moderate pain (pain score 4-6) or severe pain (pain score 7-10). 08/14/23 08/13/24  McKenzie, Kayla J, PA-C  metFORMIN  (GLUCOPHAGE ) 500 MG tablet TAKE 1 TABLET(500 MG) BY MOUTH DAILY WITH BREAKFAST 10/28/23   Newlin, Enobong, MD  methocarbamol  (ROBAXIN ) 500 MG tablet Take 1-2 tablets (500-1,000 mg total) by mouth every 8 (eight) hours as needed for muscle spasms. 08/14/23   McKenzie, Kayla J, PA-C  Misc. Devices MISC by Does not apply route. Flutter valve daily    [provider]  sacubitril -valsartan  (ENTRESTO ) 49-51 MG TAKE 1 TABLET  BY MOUTH TWICE DAILY. NEED FOLLOW UP APPOINTMENT FOR MORE REFILLS 02/04/24   Bensimhon, Toribio SAUNDERS, MD  spironolactone  (ALDACTONE ) 25 MG tablet Take 0.5 tablets (12.5 mg total) by mouth daily. 02/12/24   Bensimhon, Toribio SAUNDERS, MD  Tafamidis  (VYNDAMAX ) 61 MG CAPS Take 1 capsule (61 mg total) by mouth daily. 12/11/23   Bensimhon, Daniel R, MD  warfarin (COUMADIN ) 5 MG tablet TAKE 1 TO 2 TABLETS BY MOUTH DAILY AS DIRECTED BY COUMADIN  CLINIC 09/05/23   Court Dorn PARAS, MD    Allergies: Peanut-containing drug products, Penicillins, Decadron  [dexamethasone ], and  Ivp dye [iodinated contrast media]    Review of Systems  Updated Vital Signs BP (!) 137/94 (BP Location: Right Arm)   Pulse 90   Temp 97.8 F (36.6 C)   Resp 18   Ht 5' 6 (1.676 m)   Wt 73.5 kg   SpO2 98%   BMI 26.15 kg/m   Physical Exam Vitals and nursing note reviewed.  Constitutional:      General: He is not in acute distress.    Appearance: He is well-developed.  HENT:     Head: Normocephalic and atraumatic.     Mouth/Throat:     Mouth: Mucous membranes are moist.  Eyes:     General: Vision grossly intact. Gaze aligned appropriately.     Extraocular Movements: Extraocular movements intact.     Conjunctiva/sclera: Conjunctivae normal.  Cardiovascular:     Rate and Rhythm: Normal rate and regular rhythm.     Pulses: Normal pulses.     Heart sounds: Normal heart sounds, S1 normal and S2 normal. No murmur heard.    No friction rub. No gallop.  Pulmonary:     Effort: Pulmonary effort is normal. No respiratory distress.     Breath sounds: Normal breath sounds.  Abdominal:     Palpations: Abdomen is soft.     Tenderness: There is no abdominal tenderness. There is no guarding or rebound.     Hernia: No hernia is present.  Musculoskeletal:        General: No swelling.     Cervical back: Full passive range of motion without pain, normal range of motion and neck supple. No pain with movement, spinous process tenderness or muscular tenderness. Normal range of motion.     Right lower leg: No edema.     Left lower leg: No edema.  Skin:    General: Skin is warm and dry.     Capillary Refill: Capillary refill takes less than 2 seconds.     Findings: No ecchymosis, erythema, lesion or wound.  Neurological:     Mental Status: He is alert and oriented to person, place, and time.     GCS: GCS eye subscore is 4. GCS verbal subscore is 5. GCS motor subscore is 6.     Cranial Nerves: Cranial nerves 2-12 are intact.     Sensory: Sensation is intact.     Motor: Motor function is  intact. No weakness or abnormal muscle tone.     Coordination: Coordination is intact.  Psychiatric:        Mood and Affect: Mood normal.        Speech: Speech normal.        Behavior: Behavior normal.     (all labs ordered are listed, but only abnormal results are displayed) Labs Reviewed  BASIC METABOLIC PANEL WITH GFR - Abnormal; Notable for the following components:      Result Value   Sodium 134 (*)  CO2 20 (*)    Glucose, Bld 117 (*)    All other components within normal limits  PROTIME-INR - Abnormal; Notable for the following components:   Prothrombin Time 25.3 (*)    INR 2.2 (*)    All other components within normal limits  CBC  BRAIN NATRIURETIC PEPTIDE  TROPONIN I (HIGH SENSITIVITY)  TROPONIN I (HIGH SENSITIVITY)    EKG: EKG Interpretation Date/Time:  Monday April 19 2024 01:43:17 EDT Ventricular Rate:  91 PR Interval:  154 QRS Duration:  98 QT Interval:  366 QTC Calculation: 450 R Axis:   3  Text Interpretation: Normal sinus rhythm Anterior infarct , age undetermined Abnormal ECG When compared with ECG of 17-Aug-2023 15:35, No acute changes Confirmed by Haze Lonni PARAS ([id number]) on 04/19/2024 2:21:05 AM  Radiology: DG Chest Portable 1 View Result Date: 04/19/2024 EXAM: 1 VIEW XRAY OF THE CHEST 04/19/2024 01:58:00 AM COMPARISON: 08/17/2023 CLINICAL HISTORY: The patient is complaining of left arm pain for 2 hours. FINDINGS: LUNGS AND PLEURA: No focal pulmonary opacity. No pulmonary edema. No pleural effusion. No pneumothorax. HEART AND MEDIASTINUM: Mild cardiomegaly. BONES AND SOFT TISSUES: No acute osseous abnormality. Cervical spine hardware, incompletely visualized. LINES AND TUBES: Left subclavian ICD in appropriate position. IMPRESSION: 1. No acute findings. Electronically signed by: Pinkie Pebbles MD 04/19/2024 02:01 AM EDT RP Workstation: [id number]     Procedures   Medications Ordered in the ED - No data to display                                   Medical Decision Making Amount and/or Complexity of Data Reviewed Labs: ordered. Radiology: ordered.   Differential Diagnosis considered includes, but not limited to: STEMI; NSTEMI; myocarditis; musculoskeletal pain  Presents to the emergency department with left forearm pain.  Patient denies injury.  No deformity noted.  Normal range of motion.  No loss of sensation or stroke symptoms.  Patient reports that he had a heart attack some years ago and he only had arm pain.  Reviewing his records, however, I do not think he actually had a heart attack.  He reports no stenting.  Patient underwent left heart cath 3 years ago here that showed normal coronary arteries but he does have congestive heart failure, likely a nonischemic cardiomyopathy.  He appears to be compliant with his medications, pro time is therapeutic.  He does not appear to be decompensated from a congestive heart failure standpoint.  Troponins negative.  EKG without ischemic changes.  BNP normal.  Chest x-ray without fluid.  Exam without signs of fluid overload or edema.     Final diagnoses:  Pain of left upper extremity    ED Discharge Orders     None          Job Holtsclaw, Lonni PARAS, MD 04/19/24 [id number]

## 2024-04-19 NOTE — ED Notes (Signed)
 Nurse will collect labs.  KM

## 2024-04-19 NOTE — ED Notes (Signed)
 Pt in NAD at this time.

## 2024-04-20 ENCOUNTER — Ambulatory Visit

## 2024-04-21 ENCOUNTER — Other Ambulatory Visit: Payer: Self-pay

## 2024-04-22 ENCOUNTER — Ambulatory Visit

## 2024-04-22 ENCOUNTER — Other Ambulatory Visit: Payer: Self-pay | Admitting: Family Medicine

## 2024-04-22 VITALS — BP 124/79 | HR 82 | Temp 97.5°F | Resp 20 | Ht 66.0 in | Wt 158.6 lb

## 2024-04-22 DIAGNOSIS — D509 Iron deficiency anemia, unspecified: Secondary | ICD-10-CM | POA: Diagnosis not present

## 2024-04-22 DIAGNOSIS — E119 Type 2 diabetes mellitus without complications: Secondary | ICD-10-CM

## 2024-04-22 MED ORDER — SODIUM CHLORIDE 0.9 % IV SOLN
300.0000 mg | Freq: Once | INTRAVENOUS | Status: AC
  Administered 2024-04-22: 300 mg via INTRAVENOUS
  Filled 2024-04-22: qty 15

## 2024-04-22 MED ORDER — DIPHENHYDRAMINE HCL 25 MG PO CAPS
25.0000 mg | ORAL_CAPSULE | Freq: Once | ORAL | Status: DC
Start: 2024-04-22 — End: 2024-04-22

## 2024-04-22 MED ORDER — ACETAMINOPHEN 325 MG PO TABS
650.0000 mg | ORAL_TABLET | Freq: Once | ORAL | Status: DC
Start: 2024-04-22 — End: 2024-04-22

## 2024-04-22 NOTE — Progress Notes (Signed)
 Diagnosis: Iron  Deficiency Anemia  Provider:  Praveen Mannam MD  Procedure: IV Infusion  IV Type: Peripheral, IV Location: R Forearm  Venofer  (Iron  Sucrose), Dose: 300 mg  Infusion Start Time: 0840  Infusion Stop Time: 1020  Post Infusion IV Care: Peripheral IV Discontinued  Discharge: Condition: Good, Destination: Home . AVS Declined  Performed by:  Eleanor DELENA Bloch, RN

## 2024-04-23 ENCOUNTER — Other Ambulatory Visit: Payer: Self-pay | Admitting: Pharmacy Technician

## 2024-04-23 ENCOUNTER — Telehealth (HOSPITAL_COMMUNITY): Payer: Self-pay

## 2024-04-23 ENCOUNTER — Other Ambulatory Visit: Payer: Self-pay

## 2024-04-23 NOTE — Telephone Encounter (Signed)
 Called to confirm/remind patient of their appointment at the [facility] on 04/26/24.   Appointment:   [x] Confirmed  [] Left mess   [] No answer/No voice mail  [] VM Full/unable to leave message  [] Phone not in service  Patient reminded to bring all medications and/or complete list.  Confirmed patient has transportation. Gave directions, instructed to utilize valet parking.

## 2024-04-23 NOTE — Progress Notes (Signed)
 Specialty Pharmacy Refill Coordination Note  EDIEL UNANGST is a 68 y.o. male contacted today regarding refills of specialty medication(s) Tafamidis  (Vyndamax )   Patient requested Delivery   Delivery date: 05/04/24   Verified address: [address] [place] [address]   Medication will be filled on 05/03/24.

## 2024-04-23 NOTE — Progress Notes (Signed)
 [facility]   PCP: Theotis Haze ORN, NP Primary Cardiologist: Dr Court EP: Dr Inocencio HF: Dr Cherrie  HPI: Jonathan Dickson is a 68 y.o. 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   Had Myoview  in 5/17 with EF 22%. Subsequently underwent cath 6/17. EF 40-45%. Normal coronaries   Admitted to [facility] 8/19 with increased dyspnea. Diuresed with IV lasix . CTA performed and showed bilateral PE. Had AKI so diuretics held.  Placed on eliquis  for PE.    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  Had u/s in 11/20 with acute LLE DVT. Switched from Eliquis  to coumadin . Got COVID PNA over Christmas 12/20. CT chest showed no PE.  Echo 8/22 EF 30-35%. Hydralazine  stopped with low BP, also stopped Imdur .  s/p anterior cervical decompression and fusion on 08/14/23   Admitted with PNA 11/24. Echo 1/25 showed EF 30-35%, RV low/normal.   Today he returns for HF follow up. Overall feeling fine. No SOB with activity if he takes his time. Busy with catering business. Denies palpitations, abnormal bleeding, CP, dizziness, edema, or PND/Orthopnea. Appetite ok. Weight at home 155 pounds. Taking all medications. Receiving iron  infusions with Heme/Onc   Cardiac Testing  - Echo 1/25 showed EF 30-35%, RV low/normal.   - Echo (8/22): EF 30-35%  - CTA 05/12/2018 --.large bilateral  PE   - CPX 04/03/18 FVC 2.77 (83%)      FEV1 2.37 (91%)        FEV1/FVC 86 (109%)        MVV 76 (61%)  Resting HR: 83 Peak HR: 125   (80% age predicted max HR) BP rest: 144/84 BP peak: 168/72 Peak VO2: 23.1 (76% predicted peak VO2) VE/VCO2 slope:  41 OUES: 2.18 Peak RER: 0.93 VE/MVV:  88% O2pulse:  14   (100% predicted O2pulse)  - PYP scan obtained which was positive (Visual grade 2. Quantitative 1.57).  - cMRI 04/01/18 1. Mild LVE with mild LVH septal thickness 13 mm. Diffuse hypokinesis worse in the septum apex and  lateral walls EF 28%  2. Abnormal post gadolinium inversion recovery sequences with failure to null and uptake in the mid/subepicardial septum, inferior wall and lateral walls with some apical sparing  Findings consistent with amyloidosis or infiltrative cardiomyopathy  - Echo (8/19):EF ~30% (continues to drop in setting of TTR amyloid). RV normal without evidence of acute strain. LE u/s without DVT.  - Echo (4/19): EF 30% to 35% (Dr. Cherrie felt closer to 40-45%). Mild LVH Diffuse hypokinesis. Grade 1DD  - Echo (12/18): EF 50% to 55%.   Review of systems complete and found to be negative unless listed in HPI.   Past Medical History:  Diagnosis Date   Abnormal TSH 02/2016   AICD (automatic cardioverter/defibrillator) present    St Jude   Anxiety    Blood clotting disorder (HCC)    Bronchiectasis (HCC)    left lower lung   Cardiac amyloidosis (HCC)    Cervical disc disease    Chronic combined systolic and diastolic CHF (congestive heart failure) (HCC)    a. 01/2016 Echo: EF 25%, inf AK, diffuse sev HK, Gr1 DD, mild MR.   Coronary atherosclerosis    Depression    Diabetes mellitus without complication (HCC)    type 2   Dyspnea    with exertion   Dysrhythmia    Full thickness rotator cuff tear 06/2021  MRI   History of DVT (deep vein thrombosis) 08/04/2019   venous doppler US    History of hiatal hernia    Hx of adenomatous polyp of colon    Hyperglycemia    Hyperlipidemia    Hypertension    Hypertensive heart disease    Myocardial infarction (HCC)    greater than 5 yrs per patient on 08/06/23   NICM (nonischemic cardiomyopathy) (HCC)    a. 01/2016 Echo: EF 25%, inf AK, diffuse sev HK, Gr1 DD, mild MR; b. 01/2016 MV: EF 22%, no isch/infarct;  c. 02/2016 Cath: Nl cors, EF 35-45%.   Osteoarthritis of hips, bilateral    and hands   Paraseptal emphysema (HCC)    mild   Peanut allergy    Pneumonia    x 1   Pre-diabetes    hx but now DM 2   Presence of implantable  cardioverter-defibrillator (ICD)    St Jude   S/P repair of ventral hernia    Seizures (HCC)    allergic with penicillin - only one seizure   Sensorineural hearing loss 11/25/2017   no hearing aids, patient denies this dx   Substance abuse (HCC)    none in 20 yrs per patient as of 08/06/23   Current Outpatient Medications  Medication Sig Dispense Refill   atorvastatin  (LIPITOR ) 80 MG tablet Take 1 tablet (80 mg total) by mouth daily. 30 tablet 2   carvedilol  (COREG ) 12.5 MG tablet TAKE 1 TABLET(12.5 MG) BY MOUTH TWICE DAILY WITH A MEAL. NEED FOLLOW UP APPOINTMENT FOR ANYMORE REFILLS 60 tablet 1   Chlorphen-Phenyleph-ASA (ALKA-SELTZER PLUS COLD PO) Take 1-2 tablets by mouth 2 (two) times daily as needed (cold symptoms).      cholecalciferol  (VITAMIN D3) 25 MCG (1000 UNIT) tablet Take 1,000 Units by mouth daily.     Cyanocobalamin  (B-12) 1000 MCG SUBL Place 1,000 mcg under the tongue daily. 30 tablet 11   dapagliflozin  propanediol (FARXIGA ) 10 MG TABS tablet Take 1 tablet (10 mg total) by mouth daily. 30 tablet 5   dicyclomine  (BENTYL ) 20 MG tablet Take 1 tablet (20 mg total) by mouth 2 (two) times daily. 10 tablet 0   enoxaparin  (LOVENOX ) 80 MG/0.8ML injection Inject 80 mg into the skin every 12 (twelve) hours.     fluticasone  (FLONASE ) 50 MCG/ACT nasal spray SHAKE LIQUID AND USE 1 SPRAY IN EACH NOSTRIL DAILY 16 g 1   gabapentin  (NEURONTIN ) 300 MG capsule Take 300 mg by mouth daily as needed.     metFORMIN  (GLUCOPHAGE ) 500 MG tablet TAKE 1 TABLET(500 MG) BY MOUTH DAILY WITH BREAKFAST 90 tablet 1   Misc. Devices MISC by Does not apply route. Flutter valve daily     sacubitril -valsartan  (ENTRESTO ) 49-51 MG TAKE 1 TABLET BY MOUTH TWICE DAILY. NEED FOLLOW UP APPOINTMENT FOR MORE REFILLS 180 tablet 0   spironolactone  (ALDACTONE ) 25 MG tablet Take 0.5 tablets (12.5 mg total) by mouth daily. 45 tablet 3   Tafamidis  (VYNDAMAX ) 61 MG CAPS Take 1 capsule (61 mg total) by mouth daily. 90 capsule 3    warfarin (COUMADIN ) 5 MG tablet TAKE 1 TO 2 TABLETS BY MOUTH DAILY AS DIRECTED BY COUMADIN  CLINIC 150 tablet 1   acetaminophen  (TYLENOL ) 325 MG tablet Take 2 tablets (650 mg total) by mouth every 6 (six) hours as needed for mild pain (pain score 1-3) (or Fever >/= 101).     albuterol  (VENTOLIN  HFA) 108 (90 Base) MCG/ACT inhaler INHALE 2 PUFFS INTO THE LUNGS EVERY 6 HOURS  AS NEEDED FOR WHEEZING OR SHORTNESS OF BREATH 6.7 g 0   Aromatic Inhalants (VICKS VAPOINHALER) INHA Inhale 1 Dose into the lungs daily as needed (congestion).     No current facility-administered medications for this encounter.   Allergies  Allergen Reactions   Peanut-Containing Drug Products Swelling   Penicillins Other (See Comments)    CONVULSIONS    Decadron  [Dexamethasone ] Itching   Ivp Dye [Iodinated Contrast Media]    Social History   Socioeconomic History   Marital status: Legally Separated    Spouse name: Not on file   Number of children: 3   Years of education: Not on file   Highest education level: 10th grade  Occupational History   Not on file  Tobacco Use   Smoking status: Former    Current packs/day: 0.00    Average packs/day: 1.5 packs/day for 18.0 years (27.0 ttl pk-yrs)    Types: Cigarettes    Start date: 03/14/1985    Quit date: 03/15/2003    Years since quitting: 21.1   Smokeless tobacco: Never  Vaping Use   Vaping status: Never Used  Substance and Sexual Activity   Alcohol use: No    Comment: former, none since 2004   Drug use: Yes    Types: Heroin, Marijuana, Crack cocaine    Comment: former, none since 2004   Sexual activity: Not Currently  Other Topics Concern   Not on file  Social History Narrative   Not on file   Social Drivers of Health   Financial Resource Strain: Low Risk  (05/06/2023)   Overall Financial Resource Strain (CARDIA)    Difficulty of Paying Living Expenses: Not hard at all  Food Insecurity: No Food Insecurity (08/21/2023)   Hunger Vital Sign    Worried  About Running Out of Food in the Last Year: Never true    Ran Out of Food in the Last Year: Never true  Transportation Needs: No Transportation Needs (08/21/2023)   PRAPARE - Administrator, Civil Service (Medical): No    Lack of Transportation (Non-Medical): No  Physical Activity: Insufficiently Active (05/06/2023)   Exercise Vital Sign    Days of Exercise per Week: 3 days    Minutes of Exercise per Session: 30 min  Stress: No Stress Concern Present (05/06/2023)   [employer] of Occupational Health - Occupational Stress Questionnaire    Feeling of Stress : Not at all  Social Connections: Moderately Isolated (05/06/2023)   Social Connection and Isolation Panel    Frequency of Communication with Friends and Family: More than three times a week    Frequency of Social Gatherings with Friends and Family: Once a week    Attends Religious Services: 1 to 4 times per year    Active Member of [employer] or Organizations: No    Attends Banker Meetings: Never    Marital Status: Widowed  Intimate Partner Violence: Not At Risk (08/21/2023)   Humiliation, Afraid, Rape, and Kick questionnaire    Fear of Current or Ex-Partner: No    Emotionally Abused: No    Physically Abused: No    Sexually Abused: No    Family History  Problem Relation Age of Onset   Diabetes Mother    Diabetes Sister    Clotting disorder Neg Hx    Lung disease Neg Hx    Colon cancer Neg Hx    Esophageal cancer Neg Hx    Stomach cancer Neg Hx    Pancreatic cancer Neg  Hx    Colon polyps Neg Hx    Rectal cancer Neg Hx    BP 110/78   Pulse 87   Ht 5' 6 (1.676 m)   Wt 70.8 kg (156 lb)   SpO2 96%   BMI 25.18 kg/m   Wt Readings from Last 3 Encounters:  04/26/24 70.8 kg (156 lb)  04/22/24 71.9 kg (158 lb 9.6 oz)  04/19/24 73.5 kg (162 lb)   PHYSICAL EXAM: General:  NAD. No resp difficulty, walked into clinic HEENT: Normal Neck: Supple. No JVD. Cor: Regular rate & rhythm. No rubs, gallops or  murmurs. Lungs: Clear Abdomen: Soft, nontender, nondistended.  Extremities: No cyanosis, clubbing, rash, edema Neuro: Alert & oriented x 3, moves all 4 extremities w/o difficulty. Affect pleasant.  ICD interrogation (personally reviewed): CorVue stable, <1%VP, no VT  ASSESSMENT & PLAN:  1. Chronic Systolic Heart Failure, NICM in setting of wtTTR cardiac amyloidosis - EF dropped from 50-55%-->04/2018 25-30%  - Cath 6/17 normal cors. - cMRI (7/19): LVEF 28% inability to blank myocardium c/w amyloid  - PYP and cMRI testing strongly suggestive of TTR amyloid. No M-spike, normal IFE on myeloma panel.  - Genetic testing negative  - Repeat PYP (2/21). 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 - Stable. NYHA I-II. Volume status stable. - Will give Rx for Lasix  40 mg for PRN use. - Continue Farxiga  10 mg daily. No GU symptoms. - Continue carvedilol  12.5 mg bid. - Continue Entresto  49/51 mg bid.  - Continue spiro 12.5 mg daily. - Recent labs reviewed K 4.8, SCr 1.12  2. DMII - Stable. On metformin  - On SGLT2i. - Managed per PCP  3. HTN - Blood pressure well controlled.  - Continue current regimen.  4. PE - CTA 04/2018 with large bilateral PE. - Was on Eliquis . Now back on warfarin due to acute LLE DVT in 11/20. - CT chest 12/20 no PE. - On lifelong AC managed by INR Clinic - No bleeding  Follow up in 6 months with Dr. Cherrie. Doing well!  Harlene HERO [place], FNP  04/26/24

## 2024-04-26 ENCOUNTER — Telehealth (HOSPITAL_COMMUNITY): Payer: Self-pay | Admitting: Pharmacy Technician

## 2024-04-26 ENCOUNTER — Other Ambulatory Visit (HOSPITAL_COMMUNITY): Payer: Self-pay

## 2024-04-26 ENCOUNTER — Encounter (HOSPITAL_COMMUNITY): Payer: Self-pay

## 2024-04-26 ENCOUNTER — Ambulatory Visit (HOSPITAL_COMMUNITY)
Admission: RE | Admit: 2024-04-26 | Discharge: 2024-04-26 | Disposition: A | Discharge status: Home / Self Care | Payer: 59 | Source: Ambulatory Visit | Attending: Family Medicine | Admitting: Family Medicine

## 2024-04-26 VITALS — BP 110/78 | HR 87 | Ht 66.0 in | Wt 156.0 lb

## 2024-04-26 DIAGNOSIS — E119 Type 2 diabetes mellitus without complications: Secondary | ICD-10-CM | POA: Insufficient documentation

## 2024-04-26 DIAGNOSIS — Z95810 Presence of automatic (implantable) cardiac defibrillator: Secondary | ICD-10-CM | POA: Insufficient documentation

## 2024-04-26 DIAGNOSIS — Z79899 Other long term (current) drug therapy: Secondary | ICD-10-CM | POA: Diagnosis not present

## 2024-04-26 DIAGNOSIS — I5022 Chronic systolic (congestive) heart failure: Secondary | ICD-10-CM

## 2024-04-26 DIAGNOSIS — Z86711 Personal history of pulmonary embolism: Secondary | ICD-10-CM | POA: Diagnosis not present

## 2024-04-26 DIAGNOSIS — Z86718 Personal history of other venous thrombosis and embolism: Secondary | ICD-10-CM | POA: Diagnosis not present

## 2024-04-26 DIAGNOSIS — Z87891 Personal history of nicotine dependence: Secondary | ICD-10-CM | POA: Insufficient documentation

## 2024-04-26 DIAGNOSIS — Z981 Arthrodesis status: Secondary | ICD-10-CM | POA: Diagnosis not present

## 2024-04-26 DIAGNOSIS — I43 Cardiomyopathy in diseases classified elsewhere: Secondary | ICD-10-CM | POA: Diagnosis not present

## 2024-04-26 DIAGNOSIS — I10 Essential (primary) hypertension: Secondary | ICD-10-CM

## 2024-04-26 DIAGNOSIS — E854 Organ-limited amyloidosis: Secondary | ICD-10-CM | POA: Diagnosis not present

## 2024-04-26 DIAGNOSIS — I110 Hypertensive heart disease with heart failure: Secondary | ICD-10-CM | POA: Insufficient documentation

## 2024-04-26 DIAGNOSIS — Z7901 Long term (current) use of anticoagulants: Secondary | ICD-10-CM | POA: Diagnosis not present

## 2024-04-26 DIAGNOSIS — I428 Other cardiomyopathies: Secondary | ICD-10-CM | POA: Insufficient documentation

## 2024-04-26 MED ORDER — ENTRESTO 49-51 MG PO TABS: 1.0000 | ORAL_TABLET | Freq: Two times a day (BID) | ORAL | 3 refills | Status: AC

## 2024-04-26 MED ORDER — FUROSEMIDE 40 MG PO TABS: 40.0000 mg | ORAL_TABLET | ORAL | 1 refills | Status: DC | PRN

## 2024-04-26 NOTE — Patient Instructions (Addendum)
 Good to see you today!  Lasix  40 mg  as needed  Your physician recommends that you schedule a follow-up appointment 6 months(January) Call  office in November to schedule an appointment  If you have any questions or concerns before your next appointment please send us  a message through [place] or call our office at [phone].    TO LEAVE A MESSAGE FOR THE NURSE SELECT OPTION 2, PLEASE LEAVE A MESSAGE INCLUDING: YOUR NAME DATE OF BIRTH CALL BACK NUMBER REASON FOR CALL**this is important as we prioritize the call backs  YOU WILL RECEIVE A CALL BACK THE SAME DAY AS LONG AS YOU CALL BEFORE 4:00 PM At the [facility], you and your health needs are our priority. As part of our continuing mission to provide you with exceptional heart care, we have created designated Provider Care Teams. These Care Teams include your primary Cardiologist (physician) and Advanced Practice Providers (APPs- Physician Assistants and Nurse Practitioners) who all work together to provide you with the care you need, when you need it.   You may see any of the following providers on your designated Care Team at your next follow up: Dr Toribio Fuel Dr Ezra Shuck Dr. Ria Commander Dr. Morene Brownie Amy Lenetta, NP Caffie Shed, [place] [facility], [place] Beckey Coe, NP [place] Lee, NP Ellouise Class, NP Tinnie Redman, PharmD Jaun Bash, PharmD   Please be sure to bring in all your medications bottles to every appointment.    Thank you for choosing [facility]

## 2024-04-26 NOTE — Addendum Note (Signed)
 Addended by: JERONA DALTON HERO on: 04/26/2024 11:26 AM   Modules accepted: Orders

## 2024-04-26 NOTE — Telephone Encounter (Signed)
 [facility] Patient Advocate Encounter  Received PA request for generic Entresto . Test claim shows insurance prefers brand. $0 for 90 days. Sent 90 day RX request to Chantel (CMA) to send to [employer].  Jonathan [name] Pa, CPhT

## 2024-04-27 ENCOUNTER — Other Ambulatory Visit (HOSPITAL_COMMUNITY): Payer: Self-pay

## 2024-04-28 ENCOUNTER — Other Ambulatory Visit: Payer: Self-pay

## 2024-04-29 ENCOUNTER — Ambulatory Visit

## 2024-04-29 VITALS — BP 137/88 | HR 74 | Temp 97.5°F | Resp 18 | Ht 66.0 in | Wt 157.0 lb

## 2024-04-29 DIAGNOSIS — D509 Iron deficiency anemia, unspecified: Secondary | ICD-10-CM | POA: Diagnosis not present

## 2024-04-29 MED ORDER — SODIUM CHLORIDE 0.9 % IV SOLN
300.0000 mg | Freq: Once | INTRAVENOUS | Status: AC
  Administered 2024-04-29: 300 mg via INTRAVENOUS
  Filled 2024-04-29: qty 15

## 2024-04-29 MED ORDER — DIPHENHYDRAMINE HCL 25 MG PO CAPS: 25.0000 mg | ORAL_CAPSULE | Freq: Once | ORAL | Status: DC

## 2024-04-29 MED ORDER — ACETAMINOPHEN 325 MG PO TABS: 650.0000 mg | ORAL_TABLET | Freq: Once | ORAL | Status: DC

## 2024-04-29 NOTE — Progress Notes (Addendum)
 Diagnosis: Iron  Deficiency Anemia  Provider:  Praveen Mannam MD  Procedure: IV Infusion  IV Type: Peripheral, IV Location: R Antecubital  Venofer  (Iron  Sucrose), Dose: 300 mg  Infusion Start Time: 0847  Infusion Stop Time: 1030  Post Infusion IV Care: Patient declined observation and Peripheral IV Discontinued  Discharge: Condition: Good, Destination: Home . AVS Declined  Performed by:  Maximiano JONELLE Pouch, LPN   Patient stated that they already took their tylenol  at home at 7:00 am.  Patient refused to take benadryl . Nurse educated patient and stressed the importance of taking benadryl  as a precaution in the event of a medication reaction. Patient verbalized understanding.

## 2024-05-03 ENCOUNTER — Other Ambulatory Visit (HOSPITAL_COMMUNITY): Payer: Self-pay

## 2024-05-03 ENCOUNTER — Other Ambulatory Visit: Payer: Self-pay

## 2024-05-06 ENCOUNTER — Ambulatory Visit: Admitting: *Deleted

## 2024-05-06 ENCOUNTER — Encounter: Payer: Self-pay | Admitting: Internal Medicine

## 2024-05-06 VITALS — BP 115/73 | HR 73 | Temp 97.6°F | Resp 16 | Ht 66.0 in | Wt 155.6 lb

## 2024-05-06 DIAGNOSIS — D509 Iron deficiency anemia, unspecified: Secondary | ICD-10-CM | POA: Diagnosis not present

## 2024-05-06 MED ORDER — ACETAMINOPHEN 325 MG PO TABS: 650.0000 mg | ORAL_TABLET | Freq: Once | ORAL | Status: DC

## 2024-05-06 MED ORDER — DIPHENHYDRAMINE HCL 25 MG PO CAPS: 25.0000 mg | ORAL_CAPSULE | Freq: Once | ORAL | Status: DC

## 2024-05-06 MED ORDER — SODIUM CHLORIDE 0.9 % IV SOLN
300.0000 mg | Freq: Once | INTRAVENOUS | Status: AC
  Administered 2024-05-06: 300 mg via INTRAVENOUS
  Filled 2024-05-06: qty 15

## 2024-05-06 NOTE — Progress Notes (Signed)
 Diagnosis: Iron  Deficiency Anemia  Provider:  Mannam, Praveen MD  Procedure: IV Infusion  IV Type: Peripheral, IV Location: R Forearm,, pt complained  of burning  IV restarted Right hand  Venofer  (Iron  Sucrose), Dose: 300 mg  Infusion Start Time: 0855 am, paused @0900  and restarted @0907  am  Infusion Stop Time: 1040 am  Post Infusion IV Care: Observation period completed and Peripheral IV Discontinued  Discharge: Condition: Good, Destination: Home . AVS Declined  Performed by:  Trudy Lamarr LABOR, RN

## 2024-05-11 ENCOUNTER — Ambulatory Visit: Payer: Self-pay | Attending: Nurse Practitioner

## 2024-05-26 ENCOUNTER — Other Ambulatory Visit: Payer: Self-pay

## 2024-05-27 ENCOUNTER — Other Ambulatory Visit (HOSPITAL_COMMUNITY): Payer: Self-pay

## 2024-05-28 ENCOUNTER — Other Ambulatory Visit (HOSPITAL_COMMUNITY): Payer: Self-pay

## 2024-05-28 ENCOUNTER — Other Ambulatory Visit: Payer: Self-pay

## 2024-05-28 NOTE — Progress Notes (Signed)
 Specialty Pharmacy Refill Coordination Note  Spoke with Jonathan Dickson is a 68 y.o. male contacted today regarding refills of specialty medication(s) Tafamidis  (Vyndamax )  Doses on hand: 9  Patient requested: Delivery   Delivery date: 06/02/24   Verified address: [address] [place] [address]  Medication will be filled on 06/01/24.

## 2024-05-30 ENCOUNTER — Other Ambulatory Visit (HOSPITAL_COMMUNITY): Payer: Self-pay | Admitting: Internal Medicine

## 2024-05-30 DIAGNOSIS — I5022 Chronic systolic (congestive) heart failure: Secondary | ICD-10-CM

## 2024-06-01 ENCOUNTER — Ambulatory Visit: Attending: Internal Medicine

## 2024-06-01 ENCOUNTER — Other Ambulatory Visit: Payer: Self-pay

## 2024-06-01 DIAGNOSIS — Z7901 Long term (current) use of anticoagulants: Secondary | ICD-10-CM | POA: Diagnosis not present

## 2024-06-01 DIAGNOSIS — I824Y9 Acute embolism and thrombosis of unspecified deep veins of unspecified proximal lower extremity: Secondary | ICD-10-CM | POA: Diagnosis not present

## 2024-06-01 LAB — POCT INR: INR: 2.7 (ref 2.0–3.0)

## 2024-06-01 NOTE — Patient Instructions (Signed)
 Description   INR 2.7, Continue taking warfarin 1 tablet (5mg ) daily, except 2 tablets (10mg ) on Monday, Wednesday and Friday. Recheck INR in 6 weeks. Coumadin  Clinic [phone] Procedure Clearance Fax [phone] or [phone]

## 2024-06-01 NOTE — Progress Notes (Signed)
 Description   INR 2.7, Continue taking warfarin 1 tablet (5mg ) daily, except 2 tablets (10mg ) on Monday, Wednesday and Friday. Recheck INR in 6 weeks. Coumadin  Clinic [phone] Procedure Clearance Fax [phone] or [phone]

## 2024-06-14 ENCOUNTER — Other Ambulatory Visit (HOSPITAL_COMMUNITY): Payer: Self-pay | Admitting: Internal Medicine

## 2024-06-14 DIAGNOSIS — I5022 Chronic systolic (congestive) heart failure: Secondary | ICD-10-CM

## 2024-06-22 ENCOUNTER — Ambulatory Visit: Payer: 59

## 2024-06-22 DIAGNOSIS — I5022 Chronic systolic (congestive) heart failure: Secondary | ICD-10-CM | POA: Diagnosis not present

## 2024-06-22 LAB — CUP PACEART REMOTE DEVICE CHECK
Battery Remaining Longevity: 49 mo
Battery Remaining Percentage: 48 %
Battery Voltage: 2.95 V
Brady Statistic RV Percent Paced: 1 %
Date Time Interrogation Session: 20250923025603
HighPow Impedance: 70 Ohm
HighPow Impedance: 70 Ohm
Implantable Lead Connection Status: 753985
Implantable Lead Implant Date: 20190918
Implantable Lead Location: 753860
Implantable Lead Model: 7122
Implantable Pulse Generator Implant Date: 20190918
Lead Channel Impedance Value: 410 Ohm
Lead Channel Pacing Threshold Amplitude: 0.75 V
Lead Channel Pacing Threshold Pulse Width: 0.5 ms
Lead Channel Sensing Intrinsic Amplitude: 12 mV
Lead Channel Setting Pacing Amplitude: 2.5 V
Lead Channel Setting Pacing Pulse Width: 0.5 ms
Lead Channel Setting Sensing Sensitivity: 0.5 mV
Pulse Gen Serial Number: 9796641

## 2024-06-23 ENCOUNTER — Other Ambulatory Visit (HOSPITAL_COMMUNITY): Payer: Self-pay

## 2024-06-23 ENCOUNTER — Ambulatory Visit: Payer: Self-pay | Admitting: Cardiology

## 2024-06-23 NOTE — Progress Notes (Signed)
Remote ICD Transmission.

## 2024-06-28 ENCOUNTER — Other Ambulatory Visit: Payer: Self-pay | Admitting: Pharmacy Technician

## 2024-06-28 ENCOUNTER — Other Ambulatory Visit: Payer: Self-pay

## 2024-06-28 NOTE — Progress Notes (Signed)
 Specialty Pharmacy Refill Coordination Note  Jonathan Dickson is a 68 y.o. male contacted today regarding refills of specialty medication(s) Tafamidis  (Vyndamax )   Patient requested Delivery   Delivery date: 07/01/24   Verified address: [address]   [place]  [address]   Medication will be filled on 06/30/24.

## 2024-07-13 ENCOUNTER — Ambulatory Visit: Attending: Cardiology | Admitting: *Deleted

## 2024-07-13 DIAGNOSIS — Z7901 Long term (current) use of anticoagulants: Secondary | ICD-10-CM

## 2024-07-13 DIAGNOSIS — I824Y9 Acute embolism and thrombosis of unspecified deep veins of unspecified proximal lower extremity: Secondary | ICD-10-CM | POA: Diagnosis not present

## 2024-07-13 LAB — POCT INR: INR: 2.6 (ref 2.0–3.0)

## 2024-07-13 NOTE — Progress Notes (Signed)
 Description   INR 2.6; Continue taking warfarin 1 tablet (5mg ) daily, except 2 tablets (10mg ) on Monday, Wednesday and Friday. Recheck INR in 7 weeks. Coumadin  Clinic [phone] Procedure Clearance Fax [phone]

## 2024-07-13 NOTE — Patient Instructions (Signed)
 Description   INR 2.6; Continue taking warfarin 1 tablet (5mg ) daily, except 2 tablets (10mg ) on Monday, Wednesday and Friday. Recheck INR in 7 weeks. Coumadin  Clinic [phone] Procedure Clearance Fax [phone]

## 2024-07-27 ENCOUNTER — Other Ambulatory Visit: Payer: Self-pay

## 2024-07-27 NOTE — Progress Notes (Signed)
 Specialty Pharmacy Refill Coordination Note  Jonathan Dickson is a 68 y.o. male contacted today regarding refills of specialty medication(s) Tafamidis  (Vyndamax )   Patient requested Delivery   Delivery date: 08/02/24   Verified address: [address]   [place] [address]   Medication will be filled on: 07/30/24

## 2024-07-28 ENCOUNTER — Ambulatory Visit (INDEPENDENT_AMBULATORY_CARE_PROVIDER_SITE_OTHER): Admitting: Podiatry

## 2024-07-28 DIAGNOSIS — B351 Tinea unguium: Secondary | ICD-10-CM | POA: Diagnosis not present

## 2024-07-28 DIAGNOSIS — M79671 Pain in right foot: Secondary | ICD-10-CM

## 2024-07-28 DIAGNOSIS — M79672 Pain in left foot: Secondary | ICD-10-CM | POA: Diagnosis not present

## 2024-07-28 NOTE — Progress Notes (Signed)
 Patient presents for evaluation and treatment of tenderness and some redness around nails feet.  Tenderness around toes with walking and wearing shoes.  Physical exam:  General appearance: Alert, pleasant, and in no acute distress.  Vascular: Pedal pulses: DP 1/4 B/L, PT 1/4 B/L. Mild edema lower legs bilaterally  Neurologic:  Dermatologic:  Nails thickened, disfigured, discolored 1-5 BL with subungual debris.  Redness and hypertrophic nail folds along nail folds bilaterally but no signs of drainage or infection.  Musculoskeletal:     Diagnosis: 1. Painful onychomycotic nails 1 through 5 bilaterally. 2. Pain toes 1 through 5 bilaterally.  Plan: -Debrided onychomycotic nails 1 through 5 bilaterally.  Sharply debrided nails with nail clipper and reduced with a power bur.  Return 3 months RFC

## 2024-07-29 ENCOUNTER — Other Ambulatory Visit: Payer: Self-pay

## 2024-08-11 NOTE — Progress Notes (Signed)
 Jonathan Dickson                                          MRN: [id number]   08/11/2024   The VBCI Quality Team Specialist reviewed this patient medical record for the purposes of chart review for care gap closure. The following were reviewed: chart review for care gap closure-glycemic status assessment and kidney health evaluation for diabetes:eGFR  and uACR.    VBCI Quality Team

## 2024-08-18 ENCOUNTER — Other Ambulatory Visit (HOSPITAL_COMMUNITY): Payer: Self-pay

## 2024-08-18 ENCOUNTER — Other Ambulatory Visit: Payer: Self-pay | Admitting: Pharmacist

## 2024-08-18 DIAGNOSIS — E119 Type 2 diabetes mellitus without complications: Secondary | ICD-10-CM

## 2024-08-18 NOTE — Progress Notes (Signed)
 Pharmacy Quality Measure Review  This patient is appearing on a report for needing his uACR screening completed.   Order entered to completed at upcoming PCP visit on 09/07/2024.   Herlene Fleeta Morris, PharmD, JAQUELINE, CPP Clinical Pharmacist [facility] [phone]

## 2024-08-21 ENCOUNTER — Other Ambulatory Visit (HOSPITAL_COMMUNITY): Payer: Self-pay | Admitting: Family Medicine

## 2024-08-25 ENCOUNTER — Other Ambulatory Visit (HOSPITAL_COMMUNITY): Payer: Self-pay

## 2024-08-25 ENCOUNTER — Other Ambulatory Visit: Payer: Self-pay

## 2024-08-25 NOTE — Progress Notes (Signed)
 Specialty Pharmacy Refill Coordination Note  Jonathan Dickson is a 68 y.o. male contacted today regarding refills of specialty medication(s) Tafamidis  (Vyndamax )   Patient requested Delivery   Delivery date: 08/31/24   Verified address: [address]   [place] [address]   Medication will be filled on: 08/30/24

## 2024-08-30 ENCOUNTER — Other Ambulatory Visit: Payer: Self-pay

## 2024-08-31 ENCOUNTER — Ambulatory Visit: Attending: Internal Medicine | Admitting: *Deleted

## 2024-08-31 DIAGNOSIS — I824Y9 Acute embolism and thrombosis of unspecified deep veins of unspecified proximal lower extremity: Secondary | ICD-10-CM | POA: Diagnosis not present

## 2024-08-31 DIAGNOSIS — Z7901 Long term (current) use of anticoagulants: Secondary | ICD-10-CM

## 2024-08-31 LAB — POCT INR: INR: 2.4 (ref 2.0–3.0)

## 2024-08-31 NOTE — Progress Notes (Signed)
 Description   INR 2.4; Continue taking warfarin 1 tablet (5mg ) daily, except 2 tablets (10mg ) on Monday, Wednesday and Friday. Recheck INR in 8 weeks. Coumadin  Clinic [phone] Procedure Clearance Fax [phone]

## 2024-08-31 NOTE — Patient Instructions (Signed)
 Description   INR 2.4; Continue taking warfarin 1 tablet (5mg ) daily, except 2 tablets (10mg ) on Monday, Wednesday and Friday. Recheck INR in 8 weeks. Coumadin  Clinic [phone] Procedure Clearance Fax [phone]

## 2024-09-07 ENCOUNTER — Ambulatory Visit: Attending: Nurse Practitioner | Admitting: Nurse Practitioner

## 2024-09-07 ENCOUNTER — Encounter: Payer: Self-pay | Admitting: Nurse Practitioner

## 2024-09-07 VITALS — BP 115/68 | HR 84 | Resp 19 | Ht 66.0 in | Wt 155.6 lb

## 2024-09-07 DIAGNOSIS — E1169 Type 2 diabetes mellitus with other specified complication: Secondary | ICD-10-CM

## 2024-09-07 DIAGNOSIS — Z91010 Allergy to peanuts: Secondary | ICD-10-CM

## 2024-09-07 DIAGNOSIS — Z95811 Presence of heart assist device: Secondary | ICD-10-CM | POA: Insufficient documentation

## 2024-09-07 DIAGNOSIS — E119 Type 2 diabetes mellitus without complications: Secondary | ICD-10-CM

## 2024-09-07 DIAGNOSIS — I10 Essential (primary) hypertension: Secondary | ICD-10-CM

## 2024-09-07 DIAGNOSIS — E785 Hyperlipidemia, unspecified: Secondary | ICD-10-CM

## 2024-09-07 DIAGNOSIS — Z23 Encounter for immunization: Secondary | ICD-10-CM

## 2024-09-07 DIAGNOSIS — Z7984 Long term (current) use of oral hypoglycemic drugs: Secondary | ICD-10-CM

## 2024-09-07 MED ORDER — NEFFY 2 MG/0.1ML NA SOLN: 1.0000 | Freq: Once | NASAL | 1 refills | Status: DC | PRN

## 2024-09-07 NOTE — Progress Notes (Signed)
 Assessment & Plan:  Jonathan Dickson was seen today for hypertension.  Diagnoses and all orders for this visit:  Diabetes mellitus treated with oral medication (HCC) -     Hemoglobin A1c -     CMP14+EGFR -     CBC with Differential/Platelet -     Urine Albumin/Creatinine with ratio (send out) [LAB689] Continue blood sugar control as discussed in office today, low carbohydrate diet, and regular physical exercise as tolerated, 150 minutes per week (30 min each day, 5 days per week, or 50 min 3 days per week). Keep blood sugar logs with fasting goal of 90-130 mg/dl, post prandial (after you eat) less than 180.  For Hypoglycemia: BS <60 and Hyperglycemia BS >400; contact the clinic ASAP. Annual eye exams and foot exams are recommended.   Need for influenza vaccination -     Cancel: Flu vaccine HIGH DOSE PF(Fluzone Trivalent)  Peanut allergy -     EPINEPHrine  (NEFFY ) 2 MG/0.1ML SOLN; Place 1 spray into the nose once as needed for up to 1 dose.  Presence of heart assist device [facility]) Follow up with Cardiology   Hyperlipidemia associated with type 2 diabetes mellitus  LDL at goal with high intensity statin Lab Results  Component Value Date   LDLCALC 78 10/07/2022    Hypertension  Continue all antihypertensives as prescribed.  Reminded to bring in blood pressure log for follow  up appointment.  RECOMMENDATIONS: DASH/Mediterranean Diets are healthier choices for HTN.    Patient has been counseled on age-appropriate routine health concerns for screening and prevention. These are reviewed and up-to-date. Referrals have been placed accordingly. Immunizations are up-to-date or declined.    Subjective:   Chief Complaint  Patient presents with   Hypertension    Jonathan Dickson 68 y.o. 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 Currently prescribed carvedilol  12.5 mg BID, farxiga  10 mg daily, entresto  49-51 mg BID, spironolactone  25 mg daily BP Readings from Last 3 Encounters:  09/07/24 115/68  05/06/24 115/73  04/29/24 137/88      DM 2 A1c at goal. Currently prescribed SGLT2 and metformin  500 mg daily . Lab Results  Component Value Date   HGBA1C 6.5 (H) 08/18/2023    Review of Systems  Constitutional:  Negative for fever, malaise/fatigue and weight loss.  HENT: Negative.  Negative for nosebleeds.   Eyes: Negative.  Negative for blurred vision, double vision and photophobia.  Respiratory: Negative.  Negative for cough and shortness of breath.   Cardiovascular: Negative.  Negative for chest pain, palpitations and leg swelling.  Gastrointestinal: Negative.  Negative for heartburn, nausea and vomiting.  Musculoskeletal: Negative.  Negative for myalgias.  Neurological: Negative.  Negative for dizziness, focal weakness, seizures and headaches.  Psychiatric/Behavioral: Negative.  Negative for suicidal ideas.     Past Medical History:  Diagnosis Date   Abnormal TSH 02/2016   AICD (automatic cardioverter/defibrillator) present    St Jude   Anxiety    Blood clotting disorder    Bronchiectasis (HCC)    left lower lung   Cardiac amyloidosis (HCC)    Cervical disc disease    Chronic combined systolic and diastolic CHF (congestive heart failure) (HCC)    a. 01/2016 Echo: EF 25%, inf AK, diffuse sev HK, Gr1 DD, mild MR.   Coronary atherosclerosis  Depression    Diabetes mellitus without complication (HCC)    type 2   Dyspnea    with exertion   Dysrhythmia    Full thickness rotator cuff tear 06/2021   MRI   History of DVT (deep vein thrombosis) 08/04/2019   venous doppler US    History of hiatal hernia    Hx of adenomatous polyp of colon    Hyperglycemia    Hyperlipidemia    Hypertension    Hypertensive heart disease    Myocardial infarction (HCC)     greater than 5 yrs per patient on 08/06/23   NICM (nonischemic cardiomyopathy) (HCC)    a. 01/2016 Echo: EF 25%, inf AK, diffuse sev HK, Gr1 DD, mild MR; b. 01/2016 MV: EF 22%, no isch/infarct;  c. 02/2016 Cath: Nl cors, EF 35-45%.   Osteoarthritis of hips, bilateral    and hands   Paraseptal emphysema (HCC)    mild   Peanut allergy    Pneumonia    x 1   Pre-diabetes    hx but now DM 2   Presence of implantable cardioverter-defibrillator (ICD)    St Jude   S/P repair of ventral hernia    Seizures (HCC)    allergic with penicillin - only one seizure   Sensorineural hearing loss 11/25/2017   no hearing aids, patient denies this dx   Substance abuse (HCC)    none in 20 yrs per patient as of 08/06/23    Past Surgical History:  Procedure Laterality Date   ANTERIOR CERVICAL DECOMP/DISCECTOMY FUSION N/A 07/10/2016   Procedure: ANTERIOR CERVICAL DECOMPRESSION FUSION CERVICAL 4-5, CERVICAL 5-6, CERVICAL 6-7 WITH INSTRUMENTATION AND ALLOGRAFT;  Surgeon: Oneil Priestly, MD;  Location: [facility] OR;  Service: Orthopedics;  Laterality: N/A;   ANTERIOR CERVICAL DECOMP/DISCECTOMY FUSION N/A 04/19/2021   Procedure: ANTERIOR CERVICAL DECOMPRESSION FUSION CERVICAL 3- CERVICAL 4 WITH INSTRUMENTATION AND ALLOGRAFT;  Surgeon: Priestly Oneil, MD;  Location: [facility] OR;  Service: Orthopedics;  Laterality: N/A;   ANTERIOR CERVICAL DECOMP/DISCECTOMY FUSION N/A 08/14/2023   Procedure: ANTERIOR CERVICAL DECOMPRESSION FUSION CERVICAL 7 - THORACIC 1 WITH INSTRUMENTATION AND ALLOGRAFT;  Surgeon: Priestly Oneil, MD;  Location: [facility] OR;  Service: Orthopedics;  Laterality: N/A;   BIOPSY  07/28/2023   Procedure: BIOPSY;  Surgeon: Leigh Elspeth SQUIBB, MD;  Location: [facility];  Service: Gastroenterology;;   CARDIAC CATHETERIZATION N/A 03/11/2016   Procedure: Left Heart Cath and Coronary Angiography;  Surgeon: Alm LELON Clay, MD;  Location: [facility] INVASIVE CV LAB;  Service: Cardiovascular;  Laterality: N/A;   COLONOSCOPY      COLONOSCOPY WITH PROPOFOL  N/A 07/28/2023   Procedure: COLONOSCOPY WITH PROPOFOL ;  Surgeon: Leigh Elspeth SQUIBB, MD;  Location: [facility];  Service: Gastroenterology;  Laterality: N/A;   ESOPHAGOGASTRODUODENOSCOPY (EGD) WITH PROPOFOL  N/A 07/28/2023   Procedure: ESOPHAGOGASTRODUODENOSCOPY (EGD) WITH PROPOFOL ;  Surgeon: Leigh Elspeth SQUIBB, MD;  Location: [facility];  Service: Gastroenterology;  Laterality: N/A;   HOT HEMOSTASIS N/A 07/28/2023   Procedure: HOT HEMOSTASIS (ARGON PLASMA COAGULATION/BICAP);  Surgeon: Leigh Elspeth SQUIBB, MD;  Location: [facility];  Service: Gastroenterology;  Laterality: N/A;   ICD IMPLANT N/A 06/17/2018   Procedure: ICD IMPLANT;  Surgeon: Inocencio Soyla Lunger, MD;  Location: [facility] INVASIVE CV LAB;  Service: Cardiovascular;  Laterality: N/A;   POLYPECTOMY  07/28/2023   Procedure: POLYPECTOMY;  Surgeon: Leigh Elspeth SQUIBB, MD;  Location: [facility];  Service: Gastroenterology;;   Thumb surgery Right    VENTRAL HERNIA REPAIR N/A 10/25/2019   Procedure: PRIMARY VENTRAL HERNIA REPAIR;  Surgeon: Signe Mitzie LABOR, MD;  Location: [facility];  Service: General;  Laterality: N/A;    Family History  Problem Relation Age of Onset   Diabetes Mother    Diabetes Sister    Clotting disorder Neg Hx    Lung disease Neg Hx    Colon cancer Neg Hx    Esophageal cancer Neg Hx    Stomach cancer Neg Hx    Pancreatic cancer Neg Hx    Colon polyps Neg Hx    Rectal cancer Neg Hx     Social History Reviewed with no changes to be made today.   Outpatient Medications Prior to Visit  Medication Sig Dispense Refill   acetaminophen  (TYLENOL ) 325 MG tablet Take 2 tablets (650 mg total) by mouth every 6 (six) hours as needed for mild pain (pain score 1-3) (or Fever >/= 101).     Aromatic Inhalants (VICKS VAPOINHALER) INHA Inhale 1 Dose into the lungs daily as needed (congestion).     atorvastatin  (LIPITOR ) 80 MG tablet Take 1 tablet (80 mg total) by mouth daily. 30 tablet 2    carvedilol  (COREG ) 12.5 MG tablet TAKE 1 TABLET(12.5 MG) BY MOUTH TWICE DAILY WITH A MEAL. NEED FOLLOW UP APPOINTMENT FOR ANYMORE REFILLS 60 tablet 1   Chlorphen-Phenyleph-ASA (ALKA-SELTZER PLUS COLD PO) Take 1-2 tablets by mouth 2 (two) times daily as needed (cold symptoms).      cholecalciferol  (VITAMIN D3) 25 MCG (1000 UNIT) tablet Take 1,000 Units by mouth daily.     Cyanocobalamin  (B-12) 1000 MCG SUBL Place 1,000 mcg under the tongue daily. 30 tablet 11   dapagliflozin  propanediol (FARXIGA ) 10 MG TABS tablet TAKE 1 TABLET(10 MG) BY MOUTH DAILY 90 tablet 3   enoxaparin  (LOVENOX ) 80 MG/0.8ML injection Inject 80 mg into the skin every 12 (twelve) hours.     ENTRESTO  49-51 MG Take 1 tablet by mouth 2 (two) times daily. 180 tablet 3   fluticasone  (FLONASE ) 50 MCG/ACT nasal spray SHAKE LIQUID AND USE 1 SPRAY IN EACH NOSTRIL DAILY 16 g 1   furosemide  (LASIX ) 40 MG tablet TAKE 1 TABLET BY MOUTH EVERY DAY AS NEEDED 60 tablet 1   gabapentin  (NEURONTIN ) 300 MG capsule Take 300 mg by mouth daily as needed.     metFORMIN  (GLUCOPHAGE ) 500 MG tablet TAKE 1 TABLET(500 MG) BY MOUTH DAILY WITH BREAKFAST 90 tablet 1   Misc. Devices MISC by Does not apply route. Flutter valve daily     spironolactone  (ALDACTONE ) 25 MG tablet Take 0.5 tablets (12.5 mg total) by mouth daily. 45 tablet 3   Tafamidis  (VYNDAMAX ) 61 MG CAPS Take 1 capsule (61 mg total) by mouth daily. 90 capsule 3   warfarin (COUMADIN ) 5 MG tablet TAKE 1 TO 2 TABLETS BY MOUTH DAILY AS DIRECTED BY COUMADIN  CLINIC 150 tablet 1   albuterol  (VENTOLIN  HFA) 108 (90 Base) MCG/ACT inhaler INHALE 2 PUFFS INTO THE LUNGS EVERY 6 HOURS AS NEEDED FOR WHEEZING OR SHORTNESS OF BREATH (Patient not taking: Reported on 09/07/2024) 6.7 g 0   dicyclomine  (BENTYL ) 20 MG tablet Take 1 tablet (20 mg total) by mouth 2 (two) times daily. (Patient not taking: Reported on 09/07/2024) 10 tablet 0   No facility-administered medications prior to visit.    Allergies  Allergen  Reactions   Peanut-Containing Drug Products Swelling   Penicillins Other (See Comments)    CONVULSIONS    Decadron  [Dexamethasone ] Itching   Ivp Dye [Iodinated Contrast Media]        Objective:  BP 115/68 (BP Location: Left Arm, Patient Position: Sitting, Cuff Size: Normal)   Pulse 84   Resp 19   Ht 5' 6 (1.676 m)   SpO2 100%   BMI 25.11 kg/m  Wt Readings from Last 3 Encounters:  05/06/24 155 lb 9.6 oz (70.6 kg)  04/29/24 157 lb (71.2 kg)  04/26/24 156 lb (70.8 kg)    Physical Exam Vitals and nursing note reviewed.  Constitutional:      Appearance: He is well-developed.  HENT:     Head: Normocephalic and atraumatic.  Cardiovascular:     Rate and Rhythm: Normal rate and regular rhythm.     Heart sounds: Normal heart sounds. No murmur heard.    No friction rub. No gallop.  Pulmonary:     Effort: Pulmonary effort is normal. No tachypnea or respiratory distress.     Breath sounds: Normal breath sounds. No decreased breath sounds, wheezing, rhonchi or rales.  Chest:     Chest wall: No tenderness.  Musculoskeletal:        General: Normal range of motion.     Cervical back: Normal range of motion.  Skin:    General: Skin is warm and dry.  Neurological:     Mental Status: He is alert and oriented to person, place, and time.     Coordination: Coordination normal.  Psychiatric:        Behavior: Behavior normal. Behavior is cooperative.        Thought Content: Thought content normal.        Judgment: Judgment normal.          Patient has been counseled extensively about nutrition and exercise as well as the importance of adherence with medications and regular follow-up. The patient was given clear instructions to go to ER or return to medical center if symptoms don't improve, worsen or new problems develop. The patient verbalized understanding.   Follow-up: Return in about 6 months (around 03/08/2025).   Haze LELON Servant, FNP-BC [facility] and  [facility]ness [place], [place] [phone]   09/07/2024, 3:19 PM

## 2024-09-08 LAB — CMP14+EGFR
ALT: 14 IU/L (ref 0–44)
AST: 20 IU/L (ref 0–40)
Albumin: 4.1 g/dL (ref 3.9–4.9)
Alkaline Phosphatase: 80 IU/L (ref 47–123)
BUN/Creatinine Ratio: 15 (ref 10–24)
BUN: 16 mg/dL (ref 8–27)
Bilirubin Total: 0.3 mg/dL (ref 0.0–1.2)
CO2: 23 mmol/L (ref 20–29)
Calcium: 9.9 mg/dL (ref 8.6–10.2)
Chloride: 103 mmol/L (ref 96–106)
Creatinine, Ser: 1.09 mg/dL (ref 0.76–1.27)
Globulin, Total: 2.6 g/dL (ref 1.5–4.5)
Glucose: 88 mg/dL (ref 70–99)
Potassium: 4.3 mmol/L (ref 3.5–5.2)
Sodium: 139 mmol/L (ref 134–144)
Total Protein: 6.7 g/dL (ref 6.0–8.5)
eGFR: 73 mL/min/1.73 (ref >59)

## 2024-09-08 LAB — CBC WITH DIFFERENTIAL/PLATELET
Basophils Absolute: 0.1 x10E3/uL (ref 0.0–0.2)
Basos: 1 %
EOS (ABSOLUTE): 0.2 x10E3/uL (ref 0.0–0.4)
Eos: 3 %
Hematocrit: 47.5 % (ref 37.5–51.0)
Hemoglobin: 15.1 g/dL (ref 13.0–17.7)
Immature Grans (Abs): 0 x10E3/uL (ref 0.0–0.1)
Immature Granulocytes: 0 %
Lymphocytes Absolute: 2 x10E3/uL (ref 0.7–3.1)
Lymphs: 36 %
MCH: 28.2 pg (ref 26.6–33.0)
MCHC: 31.8 g/dL (ref 31.5–35.7)
MCV: 89 fL (ref 79–97)
Monocytes Absolute: 0.3 x10E3/uL (ref 0.1–0.9)
Monocytes: 5 %
Neutrophils Absolute: 3.1 x10E3/uL (ref 1.4–7.0)
Neutrophils: 55 %
Platelets: 186 x10E3/uL (ref 150–450)
RBC: 5.36 x10E6/uL (ref 4.14–5.80)
RDW: 14.5 % (ref 11.6–15.4)
WBC: 5.6 x10E3/uL (ref 3.4–10.8)

## 2024-09-08 LAB — HEMOGLOBIN A1C
Est. average glucose Bld gHb Est-mCnc: 137 mg/dL
Hgb A1c MFr Bld: 6.4 % — ABNORMAL HIGH (ref 4.8–5.6)

## 2024-09-09 LAB — MICROALBUMIN / CREATININE URINE RATIO
Creatinine, Urine: 91.8 mg/dL
Microalb/Creat Ratio: 87 mg/g{creat} — ABNORMAL HIGH (ref 0–29)
Microalbumin, Urine: 80.3 ug/mL

## 2024-09-17 ENCOUNTER — Telehealth: Payer: Self-pay | Admitting: Cardiovascular Disease

## 2024-09-17 NOTE — Telephone Encounter (Signed)
" °*  STAT* If patient is at the pharmacy, call can be transferred to refill team.   1. Which medications need to be refilled? (please list name of each medication and dose if known)   warfarin (COUMADIN ) 5 MG tablet    2. Which pharmacy/location (including street and city if local pharmacy) is medication to be sent to? [id number] [place], [place] - [address] AT [facility] OF [address] Phone: [phone]  Fax: [phone]      3. Do they need a 30 day or 90 day supply? 90 Pt is out of medication "

## 2024-09-17 NOTE — Telephone Encounter (Signed)
 PT calling to check status of refill. PT will run out of medication on Tuesday.

## 2024-09-18 ENCOUNTER — Ambulatory Visit: Payer: Self-pay | Admitting: Nurse Practitioner

## 2024-09-20 ENCOUNTER — Other Ambulatory Visit: Payer: Self-pay | Admitting: *Deleted

## 2024-09-20 DIAGNOSIS — I824Y9 Acute embolism and thrombosis of unspecified deep veins of unspecified proximal lower extremity: Secondary | ICD-10-CM

## 2024-09-20 MED ORDER — WARFARIN SODIUM 5 MG PO TABS: ORAL_TABLET | ORAL | 1 refills | Status: DC

## 2024-09-20 NOTE — Telephone Encounter (Signed)
 Warfarin 5mg  refill  Dx-DVT Last INR 08/31/24 Last OV 04/06/24

## 2024-09-21 ENCOUNTER — Ambulatory Visit: Payer: 59

## 2024-09-21 ENCOUNTER — Other Ambulatory Visit: Payer: Self-pay

## 2024-09-21 DIAGNOSIS — I428 Other cardiomyopathies: Secondary | ICD-10-CM

## 2024-09-21 LAB — CUP PACEART REMOTE DEVICE CHECK
Battery Remaining Longevity: 48 mo
Battery Remaining Percentage: 46 %
Battery Voltage: 2.93 V
Brady Statistic RV Percent Paced: 1 %
Date Time Interrogation Session: 20251223043248
HighPow Impedance: 68 Ohm
HighPow Impedance: 68 Ohm
Implantable Lead Connection Status: 753985
Implantable Lead Implant Date: 20190918
Implantable Lead Location: 753860
Implantable Lead Model: 7122
Implantable Pulse Generator Implant Date: 20190918
Lead Channel Impedance Value: 410 Ohm
Lead Channel Pacing Threshold Amplitude: 0.75 V
Lead Channel Pacing Threshold Pulse Width: 0.5 ms
Lead Channel Sensing Intrinsic Amplitude: 11.6 mV
Lead Channel Setting Pacing Amplitude: 2.5 V
Lead Channel Setting Pacing Pulse Width: 0.5 ms
Lead Channel Setting Sensing Sensitivity: 0.5 mV
Pulse Gen Serial Number: 9796641

## 2024-09-22 ENCOUNTER — Other Ambulatory Visit: Payer: Self-pay

## 2024-09-22 NOTE — Progress Notes (Signed)
 Remote ICD Transmission

## 2024-09-24 ENCOUNTER — Ambulatory Visit: Payer: Self-pay | Admitting: Cardiology

## 2024-09-24 ENCOUNTER — Other Ambulatory Visit: Payer: Self-pay

## 2024-09-24 ENCOUNTER — Other Ambulatory Visit (HOSPITAL_COMMUNITY): Payer: Self-pay

## 2024-09-24 ENCOUNTER — Telehealth (HOSPITAL_COMMUNITY): Payer: Self-pay

## 2024-09-24 NOTE — Progress Notes (Signed)
 Specialty Pharmacy Refill Coordination Note  Jonathan Dickson is a 68 y.o. male contacted today regarding refills of specialty medication(s) Tafamidis  (Vyndamax )   Patient requested Delivery   Delivery date: 09/28/24   Verified address: [address]   [place] [address]   Medication will be filled on: 09/27/24

## 2024-09-24 NOTE — Telephone Encounter (Signed)
 Advanced Heart Failure Patient Advocate Encounter  Prior authorization for Vyndamax  has been submitted and approved. Test billing returns $0 for 30 day supply.  Key: [id number] Effective: 09/24/2024 to 09/29/2025  Rachel DEL, CPhT Rx Patient Advocate Phone: [phone]

## 2024-09-27 ENCOUNTER — Other Ambulatory Visit: Payer: Self-pay

## 2024-10-07 ENCOUNTER — Other Ambulatory Visit (HOSPITAL_COMMUNITY): Payer: Self-pay | Admitting: Cardiology

## 2024-10-07 DIAGNOSIS — E1169 Type 2 diabetes mellitus with other specified complication: Secondary | ICD-10-CM

## 2024-10-07 MED ORDER — ATORVASTATIN CALCIUM 80 MG PO TABS: 80.0000 mg | ORAL_TABLET | Freq: Every day | ORAL | 11 refills | Status: AC

## 2024-10-08 ENCOUNTER — Inpatient Hospital Stay (HOSPITAL_COMMUNITY)
Admission: EM | Admit: 2024-10-08 | Discharge: 2024-10-12 | DRG: 300 | Disposition: A | Discharge status: Home / Self Care | Attending: Internal Medicine | Admitting: Internal Medicine

## 2024-10-08 ENCOUNTER — Other Ambulatory Visit: Payer: Self-pay

## 2024-10-08 ENCOUNTER — Emergency Department (HOSPITAL_COMMUNITY)

## 2024-10-08 DIAGNOSIS — I5042 Chronic combined systolic (congestive) and diastolic (congestive) heart failure: Secondary | ICD-10-CM | POA: Diagnosis present

## 2024-10-08 DIAGNOSIS — I82541 Chronic embolism and thrombosis of right tibial vein: Secondary | ICD-10-CM | POA: Diagnosis present

## 2024-10-08 DIAGNOSIS — I2510 Atherosclerotic heart disease of native coronary artery without angina pectoris: Secondary | ICD-10-CM | POA: Diagnosis present

## 2024-10-08 DIAGNOSIS — E876 Hypokalemia: Secondary | ICD-10-CM | POA: Diagnosis not present

## 2024-10-08 DIAGNOSIS — M160 Bilateral primary osteoarthritis of hip: Secondary | ICD-10-CM | POA: Diagnosis present

## 2024-10-08 DIAGNOSIS — Z880 Allergy status to penicillin: Secondary | ICD-10-CM

## 2024-10-08 DIAGNOSIS — I82409 Acute embolism and thrombosis of unspecified deep veins of unspecified lower extremity: Secondary | ICD-10-CM | POA: Diagnosis present

## 2024-10-08 DIAGNOSIS — E8809 Other disorders of plasma-protein metabolism, not elsewhere classified: Secondary | ICD-10-CM | POA: Diagnosis not present

## 2024-10-08 DIAGNOSIS — I82433 Acute embolism and thrombosis of popliteal vein, bilateral: Principal | ICD-10-CM | POA: Diagnosis present

## 2024-10-08 DIAGNOSIS — F32A Depression, unspecified: Secondary | ICD-10-CM | POA: Diagnosis present

## 2024-10-08 DIAGNOSIS — Z7984 Long term (current) use of oral hypoglycemic drugs: Secondary | ICD-10-CM

## 2024-10-08 DIAGNOSIS — I252 Old myocardial infarction: Secondary | ICD-10-CM

## 2024-10-08 DIAGNOSIS — E872 Acidosis, unspecified: Secondary | ICD-10-CM | POA: Diagnosis not present

## 2024-10-08 DIAGNOSIS — I82551 Chronic embolism and thrombosis of right peroneal vein: Secondary | ICD-10-CM | POA: Diagnosis present

## 2024-10-08 DIAGNOSIS — J479 Bronchiectasis, uncomplicated: Secondary | ICD-10-CM | POA: Diagnosis present

## 2024-10-08 DIAGNOSIS — E854 Organ-limited amyloidosis: Secondary | ICD-10-CM | POA: Diagnosis present

## 2024-10-08 DIAGNOSIS — I10 Essential (primary) hypertension: Secondary | ICD-10-CM

## 2024-10-08 DIAGNOSIS — I110 Hypertensive heart disease with heart failure: Secondary | ICD-10-CM | POA: Diagnosis present

## 2024-10-08 DIAGNOSIS — I82452 Acute embolism and thrombosis of left peroneal vein: Secondary | ICD-10-CM | POA: Diagnosis present

## 2024-10-08 DIAGNOSIS — I82413 Acute embolism and thrombosis of femoral vein, bilateral: Secondary | ICD-10-CM | POA: Diagnosis present

## 2024-10-08 DIAGNOSIS — E119 Type 2 diabetes mellitus without complications: Secondary | ICD-10-CM

## 2024-10-08 DIAGNOSIS — E785 Hyperlipidemia, unspecified: Secondary | ICD-10-CM | POA: Diagnosis present

## 2024-10-08 DIAGNOSIS — Z95810 Presence of automatic (implantable) cardiac defibrillator: Secondary | ICD-10-CM

## 2024-10-08 DIAGNOSIS — J438 Other emphysema: Secondary | ICD-10-CM | POA: Diagnosis present

## 2024-10-08 DIAGNOSIS — Z981 Arthrodesis status: Secondary | ICD-10-CM

## 2024-10-08 DIAGNOSIS — I82492 Acute embolism and thrombosis of other specified deep vein of left lower extremity: Secondary | ICD-10-CM

## 2024-10-08 DIAGNOSIS — I428 Other cardiomyopathies: Secondary | ICD-10-CM | POA: Diagnosis present

## 2024-10-08 DIAGNOSIS — Z833 Family history of diabetes mellitus: Secondary | ICD-10-CM

## 2024-10-08 DIAGNOSIS — Z7901 Long term (current) use of anticoagulants: Secondary | ICD-10-CM

## 2024-10-08 DIAGNOSIS — Z91041 Radiographic dye allergy status: Secondary | ICD-10-CM

## 2024-10-08 DIAGNOSIS — Z91010 Allergy to peanuts: Secondary | ICD-10-CM

## 2024-10-08 DIAGNOSIS — I824Y3 Acute embolism and thrombosis of unspecified deep veins of proximal lower extremity, bilateral: Secondary | ICD-10-CM

## 2024-10-08 DIAGNOSIS — R52 Pain, unspecified: Principal | ICD-10-CM

## 2024-10-08 DIAGNOSIS — Z860101 Personal history of adenomatous and serrated colon polyps: Secondary | ICD-10-CM

## 2024-10-08 DIAGNOSIS — E1151 Type 2 diabetes mellitus with diabetic peripheral angiopathy without gangrene: Secondary | ICD-10-CM | POA: Diagnosis present

## 2024-10-08 DIAGNOSIS — Z86711 Personal history of pulmonary embolism: Secondary | ICD-10-CM

## 2024-10-08 DIAGNOSIS — Z79899 Other long term (current) drug therapy: Secondary | ICD-10-CM

## 2024-10-08 DIAGNOSIS — D689 Coagulation defect, unspecified: Secondary | ICD-10-CM | POA: Diagnosis present

## 2024-10-08 DIAGNOSIS — Z888 Allergy status to other drugs, medicaments and biological substances status: Secondary | ICD-10-CM

## 2024-10-08 DIAGNOSIS — I82442 Acute embolism and thrombosis of left tibial vein: Secondary | ICD-10-CM | POA: Diagnosis present

## 2024-10-08 DIAGNOSIS — I43 Cardiomyopathy in diseases classified elsewhere: Secondary | ICD-10-CM | POA: Diagnosis present

## 2024-10-08 DIAGNOSIS — M879 Osteonecrosis, unspecified: Secondary | ICD-10-CM | POA: Diagnosis present

## 2024-10-08 DIAGNOSIS — Z87891 Personal history of nicotine dependence: Secondary | ICD-10-CM

## 2024-10-08 NOTE — ED Triage Notes (Signed)
 Patient reports left thigh and left hip pain onset this evening , denies injury /ambulatory using cane .

## 2024-10-09 ENCOUNTER — Encounter (HOSPITAL_COMMUNITY): Payer: Self-pay | Admitting: Hospitalist

## 2024-10-09 ENCOUNTER — Emergency Department (HOSPITAL_COMMUNITY)

## 2024-10-09 DIAGNOSIS — E854 Organ-limited amyloidosis: Secondary | ICD-10-CM | POA: Diagnosis present

## 2024-10-09 DIAGNOSIS — F32A Depression, unspecified: Secondary | ICD-10-CM | POA: Diagnosis present

## 2024-10-09 DIAGNOSIS — M79662 Pain in left lower leg: Secondary | ICD-10-CM

## 2024-10-09 DIAGNOSIS — I739 Peripheral vascular disease, unspecified: Secondary | ICD-10-CM

## 2024-10-09 DIAGNOSIS — I82541 Chronic embolism and thrombosis of right tibial vein: Secondary | ICD-10-CM | POA: Diagnosis present

## 2024-10-09 DIAGNOSIS — M8700 Idiopathic aseptic necrosis of unspecified bone: Secondary | ICD-10-CM | POA: Diagnosis not present

## 2024-10-09 DIAGNOSIS — R52 Pain, unspecified: Secondary | ICD-10-CM

## 2024-10-09 DIAGNOSIS — I82409 Acute embolism and thrombosis of unspecified deep veins of unspecified lower extremity: Secondary | ICD-10-CM | POA: Diagnosis present

## 2024-10-09 DIAGNOSIS — Z7901 Long term (current) use of anticoagulants: Secondary | ICD-10-CM | POA: Diagnosis not present

## 2024-10-09 DIAGNOSIS — I428 Other cardiomyopathies: Secondary | ICD-10-CM | POA: Diagnosis present

## 2024-10-09 DIAGNOSIS — M879 Osteonecrosis, unspecified: Secondary | ICD-10-CM | POA: Diagnosis present

## 2024-10-09 DIAGNOSIS — E8809 Other disorders of plasma-protein metabolism, not elsewhere classified: Secondary | ICD-10-CM | POA: Diagnosis not present

## 2024-10-09 DIAGNOSIS — I82442 Acute embolism and thrombosis of left tibial vein: Secondary | ICD-10-CM | POA: Diagnosis present

## 2024-10-09 DIAGNOSIS — J479 Bronchiectasis, uncomplicated: Secondary | ICD-10-CM | POA: Diagnosis present

## 2024-10-09 DIAGNOSIS — E1151 Type 2 diabetes mellitus with diabetic peripheral angiopathy without gangrene: Secondary | ICD-10-CM | POA: Diagnosis present

## 2024-10-09 DIAGNOSIS — I5042 Chronic combined systolic (congestive) and diastolic (congestive) heart failure: Secondary | ICD-10-CM | POA: Diagnosis present

## 2024-10-09 DIAGNOSIS — I824Y3 Acute embolism and thrombosis of unspecified deep veins of proximal lower extremity, bilateral: Secondary | ICD-10-CM | POA: Diagnosis not present

## 2024-10-09 DIAGNOSIS — I82413 Acute embolism and thrombosis of femoral vein, bilateral: Secondary | ICD-10-CM | POA: Diagnosis present

## 2024-10-09 DIAGNOSIS — I82452 Acute embolism and thrombosis of left peroneal vein: Secondary | ICD-10-CM | POA: Diagnosis present

## 2024-10-09 DIAGNOSIS — I82433 Acute embolism and thrombosis of popliteal vein, bilateral: Secondary | ICD-10-CM | POA: Diagnosis present

## 2024-10-09 DIAGNOSIS — M160 Bilateral primary osteoarthritis of hip: Secondary | ICD-10-CM | POA: Diagnosis present

## 2024-10-09 DIAGNOSIS — E876 Hypokalemia: Secondary | ICD-10-CM | POA: Diagnosis not present

## 2024-10-09 DIAGNOSIS — I82551 Chronic embolism and thrombosis of right peroneal vein: Secondary | ICD-10-CM | POA: Diagnosis present

## 2024-10-09 DIAGNOSIS — E785 Hyperlipidemia, unspecified: Secondary | ICD-10-CM | POA: Diagnosis present

## 2024-10-09 DIAGNOSIS — I110 Hypertensive heart disease with heart failure: Secondary | ICD-10-CM | POA: Diagnosis present

## 2024-10-09 DIAGNOSIS — I43 Cardiomyopathy in diseases classified elsewhere: Secondary | ICD-10-CM | POA: Diagnosis present

## 2024-10-09 DIAGNOSIS — D689 Coagulation defect, unspecified: Secondary | ICD-10-CM | POA: Diagnosis present

## 2024-10-09 DIAGNOSIS — M79605 Pain in left leg: Secondary | ICD-10-CM | POA: Diagnosis present

## 2024-10-09 DIAGNOSIS — E872 Acidosis, unspecified: Secondary | ICD-10-CM | POA: Diagnosis not present

## 2024-10-09 DIAGNOSIS — J438 Other emphysema: Secondary | ICD-10-CM | POA: Diagnosis present

## 2024-10-09 LAB — CBC
HCT: 45.5 % (ref 39.0–52.0)
Hemoglobin: 14.9 g/dL (ref 13.0–17.0)
MCH: 29 pg (ref 26.0–34.0)
MCHC: 32.7 g/dL (ref 30.0–36.0)
MCV: 88.7 fL (ref 80.0–100.0)
Platelets: 187 K/uL (ref 150–400)
RBC: 5.13 MIL/uL (ref 4.22–5.81)
RDW: 14.6 % (ref 11.5–15.5)
WBC: 7.1 K/uL (ref 4.0–10.5)
nRBC: 0 % (ref 0.0–0.2)

## 2024-10-09 LAB — BASIC METABOLIC PANEL WITH GFR
Anion gap: 12 (ref 5–15)
BUN: 16 mg/dL (ref 8–23)
CO2: 20 mmol/L — ABNORMAL LOW (ref 22–32)
Calcium: 9.1 mg/dL (ref 8.9–10.3)
Chloride: 103 mmol/L (ref 98–111)
Creatinine, Ser: 0.99 mg/dL (ref 0.61–1.24)
GFR, Estimated: >60 mL/min
Glucose, Bld: 96 mg/dL (ref 70–99)
Potassium: 4.2 mmol/L (ref 3.5–5.1)
Sodium: 136 mmol/L (ref 135–145)

## 2024-10-09 LAB — VAS US ABI WITH/WO TBI
Left ABI: 0.88
Right ABI: 1.19

## 2024-10-09 LAB — HEPARIN LEVEL (UNFRACTIONATED): Heparin Unfractionated: 0.42 [IU]/mL (ref 0.30–0.70)

## 2024-10-09 LAB — PROTIME-INR
INR: 2.8 — ABNORMAL HIGH (ref 0.8–1.2)
Prothrombin Time: 30.9 s — ABNORMAL HIGH (ref 11.4–15.2)

## 2024-10-09 MED ORDER — CARVEDILOL 12.5 MG PO TABS
12.5000 mg | ORAL_TABLET | Freq: Two times a day (BID) | ORAL | Status: DC
  Administered 2024-10-09 – 2024-10-12 (×6): 12.5 mg via ORAL
  Filled 2024-10-09 (×6): qty 1

## 2024-10-09 MED ORDER — SACUBITRIL-VALSARTAN 49-51 MG PO TABS
1.0000 | ORAL_TABLET | Freq: Two times a day (BID) | ORAL | Status: DC
  Administered 2024-10-09 – 2024-10-12 (×6): 1 via ORAL
  Filled 2024-10-09 (×11): qty 1

## 2024-10-09 MED ORDER — ORAL CARE MOUTH RINSE: 15.0000 mL | OROMUCOSAL | Status: DC | PRN

## 2024-10-09 MED ORDER — HYDROMORPHONE HCL 1 MG/ML IJ SOLN
1.0000 mg | INTRAMUSCULAR | Status: DC | PRN
  Administered 2024-10-09 – 2024-10-11 (×2): 1 mg via INTRAVENOUS
  Filled 2024-10-09 (×2): qty 1

## 2024-10-09 MED ORDER — DAPAGLIFLOZIN PROPANEDIOL 10 MG PO TABS
10.0000 mg | ORAL_TABLET | Freq: Every day | ORAL | Status: DC
  Administered 2024-10-10 – 2024-10-12 (×3): 10 mg via ORAL
  Filled 2024-10-09 (×3): qty 1

## 2024-10-09 MED ORDER — OXYCODONE HCL 5 MG PO TABS
5.0000 mg | ORAL_TABLET | ORAL | Status: DC | PRN
  Administered 2024-10-10 (×2): 5 mg via ORAL
  Filled 2024-10-09 (×3): qty 1

## 2024-10-09 MED ORDER — FUROSEMIDE 20 MG PO TABS
20.0000 mg | ORAL_TABLET | Freq: Every day | ORAL | Status: DC
  Administered 2024-10-09 – 2024-10-12 (×4): 20 mg via ORAL
  Filled 2024-10-09 (×4): qty 1

## 2024-10-09 MED ORDER — ACETAMINOPHEN 500 MG PO TABS
1000.0000 mg | ORAL_TABLET | Freq: Three times a day (TID) | ORAL | Status: DC
  Administered 2024-10-09 – 2024-10-12 (×9): 1000 mg via ORAL
  Filled 2024-10-09 (×9): qty 2

## 2024-10-09 MED ORDER — GABAPENTIN 300 MG PO CAPS
300.0000 mg | ORAL_CAPSULE | Freq: Three times a day (TID) | ORAL | Status: DC
  Administered 2024-10-09 – 2024-10-12 (×9): 300 mg via ORAL
  Filled 2024-10-09 (×9): qty 1

## 2024-10-09 MED ORDER — ATORVASTATIN CALCIUM 80 MG PO TABS
80.0000 mg | ORAL_TABLET | Freq: Every day | ORAL | Status: DC
  Administered 2024-10-09 – 2024-10-12 (×4): 80 mg via ORAL
  Filled 2024-10-09 (×4): qty 1

## 2024-10-09 MED ORDER — HYDROMORPHONE HCL 1 MG/ML IJ SOLN
0.5000 mg | Freq: Once | INTRAMUSCULAR | Status: AC
  Administered 2024-10-09: 0.5 mg via INTRAVENOUS
  Filled 2024-10-09: qty 1

## 2024-10-09 MED ORDER — HYDROMORPHONE HCL 1 MG/ML IJ SOLN
1.0000 mg | Freq: Once | INTRAMUSCULAR | Status: AC
  Administered 2024-10-09: 1 mg via INTRAVENOUS
  Filled 2024-10-09: qty 1

## 2024-10-09 MED ORDER — HYDROMORPHONE HCL 1 MG/ML IJ SOLN: 1.0000 mg | INTRAMUSCULAR | Status: DC | PRN

## 2024-10-09 MED ORDER — HEPARIN (PORCINE) 25000 UT/250ML-% IV SOLN
1050.0000 [IU]/h | INTRAVENOUS | Status: DC
  Administered 2024-10-09: 1000 [IU]/h via INTRAVENOUS
  Administered 2024-10-10 – 2024-10-11 (×2): 1050 [IU]/h via INTRAVENOUS
  Filled 2024-10-09 (×3): qty 250

## 2024-10-09 MED ORDER — METHOCARBAMOL 500 MG PO TABS
500.0000 mg | ORAL_TABLET | Freq: Three times a day (TID) | ORAL | Status: DC
  Administered 2024-10-09 – 2024-10-12 (×9): 500 mg via ORAL
  Filled 2024-10-09 (×9): qty 1

## 2024-10-09 NOTE — H&P (Addendum)
 " History and Physical    Patient: Jonathan Dickson FMW:[id number] DOB: [birth year] DOA: 10/08/2024 DOS: the patient was seen and examined on 10/09/2024 PCP: Theotis Haze ORN, NP  Patient coming from: Home  Chief Complaint:  Chief Complaint  Patient presents with   Left Thigh/Hip Pain    HPI: Jonathan Dickson is a 69 y.o. 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   The patient experienced the onset of left leg pain three days prior to the visit, which began while walking in a thrift store after a few hours of activity. Initially, the patient thought it might be a pinched nerve or pulled muscle and attempted to alleviate symptoms with muscle relaxants, Tylenol , and Epsom salt baths, but found no relief. The patient visited the hospital after two nights of persistent pain without improvement. The patient has a history of heart problems, including multiple heart attacks and a weak heart, necessitating a defibrillator. 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  In the ED, pt AFVSS. Labs unremarkable. 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 ABI showed resting left ankle-brachial index indicates mild left lower extremity arterial disease w/ normal left toe-brachial index. BLE US  dopplers showed bilateral acute DVTs.  EDP consulted vascular surgery and requested medicine admission.   Review of Systems: As mentioned in the history of present illness. All other systems reviewed and are  negative. Past Medical History:  Diagnosis Date   Abnormal TSH 02/2016   AICD (automatic cardioverter/defibrillator) present    St Jude   Anxiety    Blood clotting disorder    Bronchiectasis (HCC)    left lower lung   Cardiac amyloidosis (HCC)    Cervical disc disease    Chronic combined systolic and diastolic CHF (congestive heart failure) (HCC)    a. 01/2016 Echo: EF 25%, inf AK, diffuse sev HK, Gr1 DD, mild MR.   Coronary atherosclerosis    Depression    Diabetes mellitus without complication (HCC)    type 2   Dyspnea    with exertion   Dysrhythmia    Full thickness rotator cuff tear 06/2021   MRI   History of DVT (deep vein thrombosis) 08/04/2019   venous doppler US    History of hiatal hernia    Hx of adenomatous polyp of colon    Hyperglycemia    Hyperlipidemia    Hypertension    Hypertensive heart disease    Myocardial infarction (HCC)    greater than 5 yrs per patient on 08/06/23   NICM (nonischemic cardiomyopathy) (HCC)    a. 01/2016 Echo: EF 25%, inf AK, diffuse sev HK, Gr1 DD, mild MR; b. 01/2016 MV: EF 22%, no isch/infarct;  c. 02/2016 Cath: Nl cors, EF 35-45%.   Osteoarthritis of hips, bilateral    and hands   Paraseptal emphysema (HCC)    mild   Peanut allergy    Pneumonia    x 1   Pre-diabetes  hx but now DM 2   Presence of implantable cardioverter-defibrillator (ICD)    St Jude   S/P repair of ventral hernia    Seizures (HCC)    allergic with penicillin - only one seizure   Sensorineural hearing loss 11/25/2017   no hearing aids, patient denies this dx   Substance abuse (HCC)    none in 20 yrs per patient as of 08/06/23   Past Surgical History:  Procedure Laterality Date   ANTERIOR CERVICAL DECOMP/DISCECTOMY FUSION N/A 07/10/2016   Procedure: ANTERIOR CERVICAL DECOMPRESSION FUSION CERVICAL 4-5, CERVICAL 5-6, CERVICAL 6-7 WITH INSTRUMENTATION AND ALLOGRAFT;  Surgeon: Oneil Priestly, MD;  Location: [facility] OR;  Service: Orthopedics;  Laterality: N/A;    ANTERIOR CERVICAL DECOMP/DISCECTOMY FUSION N/A 04/19/2021   Procedure: ANTERIOR CERVICAL DECOMPRESSION FUSION CERVICAL 3- CERVICAL 4 WITH INSTRUMENTATION AND ALLOGRAFT;  Surgeon: Priestly Oneil, MD;  Location: [facility] OR;  Service: Orthopedics;  Laterality: N/A;   ANTERIOR CERVICAL DECOMP/DISCECTOMY FUSION N/A 08/14/2023   Procedure: ANTERIOR CERVICAL DECOMPRESSION FUSION CERVICAL 7 - THORACIC 1 WITH INSTRUMENTATION AND ALLOGRAFT;  Surgeon: Priestly Oneil, MD;  Location: [facility];  Service: Orthopedics;  Laterality: N/A;   BIOPSY  07/28/2023   Procedure: BIOPSY;  Surgeon: Leigh Elspeth SQUIBB, MD;  Location: [facility];  Service: Gastroenterology;;   CARDIAC CATHETERIZATION N/A 03/11/2016   Procedure: Left Heart Cath and Coronary Angiography;  Surgeon: Alm LELON Clay, MD;  Location: [facility] INVASIVE CV LAB;  Service: Cardiovascular;  Laterality: N/A;   COLONOSCOPY     COLONOSCOPY WITH PROPOFOL  N/A 07/28/2023   Procedure: COLONOSCOPY WITH PROPOFOL ;  Surgeon: Leigh Elspeth SQUIBB, MD;  Location: [facility];  Service: Gastroenterology;  Laterality: N/A;   ESOPHAGOGASTRODUODENOSCOPY (EGD) WITH PROPOFOL  N/A 07/28/2023   Procedure: ESOPHAGOGASTRODUODENOSCOPY (EGD) WITH PROPOFOL ;  Surgeon: Leigh Elspeth SQUIBB, MD;  Location: [facility];  Service: Gastroenterology;  Laterality: N/A;   HOT HEMOSTASIS N/A 07/28/2023   Procedure: HOT HEMOSTASIS (ARGON PLASMA COAGULATION/BICAP);  Surgeon: Leigh Elspeth SQUIBB, MD;  Location: [facility];  Service: Gastroenterology;  Laterality: N/A;   ICD IMPLANT N/A 06/17/2018   Procedure: ICD IMPLANT;  Surgeon: Inocencio Soyla Lunger, MD;  Location[facility] INV[facility] CV LAB;  Service: Cardiovascular;  Laterality: N/A;   POLYPECTOMY  07/28/2023   Procedure: POLYPECTOMY;  Surgeon: Leigh Elspeth SQUIBB, MD;  Location: [facility];  Service: Gastroenterology;;   Thumb surgery Right    VENTRAL HERNIA REPAIR N/A 10/25/2019   Procedure: PRIMARY VENTRAL HERNIA REPAIR;  Surgeon: Signe Mitzie LABOR, MD;  Location: [facility];  Service: General;  Laterality: N/A;   Social History:  reports that he quit smoking about 21 years ago. His smoking use included cigarettes. He started smoking about 39 years ago. He has a 27 pack-year smoking history. He has never used smokeless tobacco. He reports that he does not currently use drugs after having used the following drugs: Heroin, Marijuana, and Crack cocaine. He reports that he does not drink alcohol.  Allergies[1]  Family History  Problem Relation Age of Onset   Diabetes Mother    Diabetes Sister    Clotting disorder Neg Hx    Lung disease Neg Hx    Colon cancer Neg Hx    Esophageal cancer Neg Hx    Stomach cancer Neg Hx    Pancreatic cancer Neg Hx    Colon polyps Neg Hx    Rectal cancer Neg Hx     Prior to Admission medications  Medication Sig Start Date End Date Taking? Authorizing Provider  acetaminophen  (TYLENOL )  325 MG tablet Take 2 tablets (650 mg total) by mouth every 6 (six) hours as needed for mild pain (pain score 1-3) (or Fever >/= 101). 08/19/23  Yes Hongalgi, Trenda BIRCH, MD  albuterol  (VENTOLIN  HFA) 108 (90 Base) MCG/ACT inhaler INHALE 2 PUFFS INTO THE LUNGS EVERY 6 HOURS AS NEEDED FOR WHEEZING OR SHORTNESS OF BREATH 12/17/23  Yes Parrett, Tammy S, NP  Aromatic Inhalants (VICKS VAPOINHALER) INHA Inhale 1 Dose into the lungs daily as needed (congestion).   Yes [provider]  atorvastatin  (LIPITOR ) 80 MG tablet Take 1 tablet (80 mg total) by mouth daily. 10/07/24  Yes Bensimhon, Toribio SAUNDERS, MD  carvedilol  (COREG ) 12.5 MG tablet TAKE 1 TABLET(12.5 MG) BY MOUTH TWICE DAILY WITH A MEAL. NEED FOLLOW UP APPOINTMENT FOR ANYMORE REFILLS 12/19/23  Yes Bensimhon, Toribio SAUNDERS, MD  Chlorphen-Phenyleph-ASA (ALKA-SELTZER PLUS COLD PO) Take 1-2 tablets by mouth 2 (two) times daily as needed (cold symptoms).    Yes [provider]  cholecalciferol  (VITAMIN D3) 25 MCG (1000 UNIT) tablet Take 1,000 Units by mouth daily. 11/25/17   Yes [provider]  cyanocobalamin  (VITAMIN B12) 1000 MCG tablet Take 1,000 mcg by mouth daily.   Yes [provider]  dapagliflozin  propanediol (FARXIGA ) 10 MG TABS tablet TAKE 1 TABLET(10 MG) BY MOUTH DAILY 06/01/24  Yes Milford, Fieldon, FNP  ENTRESTO  49-51 MG Take 1 tablet by mouth 2 (two) times daily. 04/26/24  Yes Bensimhon, Toribio SAUNDERS, MD  EPINEPHrine  (NEFFY ) 2 MG/0.1ML SOLN Place 1 spray into the nose once as needed for up to 1 dose. 09/07/24  Yes Fleming, Zelda W, NP  fluticasone  (FLONASE ) 50 MCG/ACT nasal spray SHAKE LIQUID AND USE 1 SPRAY IN EACH NOSTRIL DAILY Patient taking differently: Place 1 spray into both nostrils as needed for allergies. 08/12/23  Yes Meade Verdon RAMAN, MD  furosemide  (LASIX ) 40 MG tablet TAKE 1 TABLET BY MOUTH EVERY DAY AS NEEDED 08/23/24  Yes Bensimhon, Toribio SAUNDERS, MD  gabapentin  (NEURONTIN ) 300 MG capsule Take 300 mg by mouth daily as needed. 07/07/23  Yes [provider]  metFORMIN  (GLUCOPHAGE ) 500 MG tablet TAKE 1 TABLET(500 MG) BY MOUTH DAILY WITH BREAKFAST 04/22/24  Yes Fleming, Zelda W, NP  spironolactone  (ALDACTONE ) 25 MG tablet Take 0.5 tablets (12.5 mg total) by mouth daily. 02/12/24  Yes Bensimhon, Toribio SAUNDERS, MD  Tafamidis  (VYNDAMAX ) 61 MG CAPS Take 1 capsule (61 mg total) by mouth daily. 12/11/23  Yes Bensimhon, Daniel R, MD  warfarin (COUMADIN ) 5 MG tablet TAKE 1 TO 2 TABLETS BY MOUTH DAILY AS DIRECTED BY ANTICOAGULATION CLINIC Patient taking differently: Take 5-10 mg by mouth See admin instructions. Take one tablet by mouth on Tues, Thurs, Sat, Sun and two tablets by mouth on Mon, Wed, Fri 09/20/24  Yes Bensimhon, Toribio SAUNDERS, MD  Misc. Devices MISC by Does not apply route. Flutter valve daily    [provider]    Physical Exam: Vitals:   10/08/24 2145 10/09/24 0244 10/09/24 0719 10/09/24 1023  BP: (!) 140/78 131/70 126/83 133/85  Pulse: 80 90 84 78  Resp: 20 17 16 16   Temp: 98.3 F (36.8 C) 98.5 F (36.9 C) 97.7 F  (36.5 C) 97.9 F (36.6 C)  TempSrc:   Oral Oral  SpO2: 99% 98% 100% 100%   General: Alert, oriented x3, resting comfortably in no acute distress Respiratory: Lungs clear to auscultation bilaterally with normal respiratory effort; no w/r/r Cardiovascular: Regular rate and rhythm w/o m/r/g MSK: LLE pain on internal rotation  c/w hip pain   Data Reviewed:  Lab Results  Component Value Date   WBC 7.1 10/09/2024   HGB 14.9 10/09/2024   HCT 45.5 10/09/2024   MCV 88.7 10/09/2024   PLT 187 10/09/2024   Lab Results  Component Value Date   GLUCOSE 96 10/09/2024   CALCIUM  9.1 10/09/2024   NA 136 10/09/2024   K 4.2 10/09/2024   CO2 20 (L) 10/09/2024   CL 103 10/09/2024   BUN 16 10/09/2024   CREATININE 0.99 10/09/2024   Lab Results  Component Value Date   ALT 14 09/07/2024   AST 20 09/07/2024   ALKPHOS 80 09/07/2024   BILITOT 0.3 09/07/2024   Lab Results  Component Value Date   INR 2.8 (H) 10/09/2024   INR 2.4 08/31/2024   INR 2.6 07/13/2024   Radiology: VAS US  ABI WITH/WO TBI Result Date: 10/09/2024  LOWER EXTREMITY DOPPLER STUDY Patient Name:  Jonathan Dickson  Date of Exam:   10/09/2024 Medical Rec #: [id number]      Accession #:    [id number] Date of Birth: 09-19-1956      Patient Gender: M Patient Age:   24 years Exam Location:  [facility] Procedure:      VAS US  ABI WITH/WO TBI Referring Phys: [facility] PFEIFFER --------------------------------------------------------------------------------  Indications: Patient states he has been having acute, significant left              lower extremity rest pain X 3 days in the thigh and hip, which              nothing relieves. Patient endorses a history of chronic calf              claudication, stating he can walk about half a block before              resting, and              walking up hills/stairs makes it worse. High Risk Factors: Hypertension, hyperlipidemia, Diabetes, past history of                    smoking, prior MI, coronary  artery disease. Other Factors: AICD, on Coumadin . CHF. NICM, Cardiac Amyloid, clotting                disorder (patient is unsure of which one), Remote history of                multiple remote DVT and PE, bilaterally.  Comparison Study: Prior ABI done 01/09/2018 Performing Technologist: Rachel Pellet RVS  Examination Guidelines: A complete evaluation includes at minimum, Doppler waveform signals and systolic blood pressure reading at the level of bilateral brachial, anterior tibial, and posterior tibial arteries, when vessel segments are accessible. Bilateral testing is considered an integral part of a complete examination. Photoelectric Plethysmograph (PPG) waveforms and toe systolic pressure readings are included as required and additional duplex testing as needed. Limited examinations for reoccurring indications may be performed as noted.  ABI Findings: +---------+------------------+-----+-----------+--------+ Right    Rt Pressure (mmHg)IndexWaveform   Comment  +---------+------------------+-----+-----------+--------+ Brachial 139                    triphasic           +---------+------------------+-----+-----------+--------+ PTA      165               1.19 multiphasic         +---------+------------------+-----+-----------+--------+ DP  157               1.13 multiphasic         +---------+------------------+-----+-----------+--------+ Great Toe91                0.65                     +---------+------------------+-----+-----------+--------+ +---------+------------------+-----+---------+-------+ Left     Lt Pressure (mmHg)IndexWaveform Comment +---------+------------------+-----+---------+-------+ Brachial 126                    triphasic        +---------+------------------+-----+---------+-------+ PTA      113               0.81 biphasic         +---------+------------------+-----+---------+-------+ DP       122               0.88 biphasic          +---------+------------------+-----+---------+-------+ Great Toe63                0.45                  +---------+------------------+-----+---------+-------+ +-------+-----------+-----------+------------+------------+ ABI/TBIToday's ABIToday's TBIPrevious ABIPrevious TBI +-------+-----------+-----------+------------+------------+ Right  1.19       0.65       1.21        0.77         +-------+-----------+-----------+------------+------------+ Left   0.88       0.45       1.15        1.02         +-------+-----------+-----------+------------+------------+ Bilateral ABIs and TBIs appear decreased compared to prior study on 01/09/2018.  Summary: Right: Resting right ankle-brachial index is within normal range. Right toe pressure is >60 mmHg which suggests adequate perfusion for healing. Left: Resting left ankle-brachial index indicates mild left lower extremity arterial disease. The left toe-brachial index is abnormal.  *See table(s) above for measurements and observations.  Electronically signed by Fonda Rim on 10/09/2024 at 1:18:19 PM.    Final    VAS US  LOWER EXTREMITY ARTERIAL DUPLEX Result Date: 10/09/2024 LOWER EXTREMITY ARTERIAL DUPLEX STUDY Patient Name:  Jonathan Dickson  Date of Exam:   10/09/2024 Medical Rec #: [id number]      Accession #:    [id number] Date of Birth: Feb 23, 1956      Patient Gender: M Patient Age:   69 years Exam Location:  [facility] Procedure:      VAS US  LOWER EXTREMITY ARTERIAL DUPLEX Referring Phys: --------------------------------------------------------------------------------  Indications: Patient states he has been having acute, significant left lower              extremity rest pain X 3 days in the thigh and hip, which nothing              relieves. Patient endorses a history of chronic calf claudication,              stating he can walk about half a block before resting, and walking              up hills/stairs makes it worse. High Risk Factors:  Hypertension, hyperlipidemia, Diabetes, past history of                    smoking, prior MI. Other Factors: AICD, on Coumadin . CHF. NICM, Cardiac Amyloid, clotting disorder                (  patient is unsure of which one), Remote history of mulitple                remote DVT and PE, bilaterally.  Current ABI: ABI: R:1.19 L:0.88              TBI: R:0.65 L:0.45 Comparison Study: No prior LEA on file Performing Technologist: Alberta Lis RVS  Examination Guidelines: A complete evaluation includes B-mode imaging, spectral Doppler, color Doppler, and power Doppler as needed of all accessible portions of each vessel. Bilateral testing is considered an integral part of a complete examination. Limited examinations for reoccurring indications may be performed as noted.  +-----------+--------+-----+--------+---------+-----------+ RIGHT      PSV cm/sRatioStenosisWaveform Comments    +-----------+--------+-----+--------+---------+-----------+ CFA Prox   79                   triphasic            +-----------+--------+-----+--------+---------+-----------+ SFA Prox   71                   triphasic            +-----------+--------+-----+--------+---------+-----------+ SFA Distal 83                   triphasic            +-----------+--------+-----+--------+---------+-----------+ POP Prox   29                   triphasic            +-----------+--------+-----+--------+---------+-----------+ TP Trunk   30                   triphasic            +-----------+--------+-----+--------+---------+-----------+ PTA Mid    61                   triphasic            +-----------+--------+-----+--------+---------+-----------+ PTA Distal                               collaterals +-----------+--------+-----+--------+---------+-----------+ PERO Distal                              collaterals +-----------+--------+-----+--------+---------+-----------+   +-----------+--------+-----+--------+--------------------+-----------------+ LEFT       PSV cm/sRatioStenosisWaveform            Comments          +-----------+--------+-----+--------+--------------------+-----------------+ EIA Distal 98                   multiphasic                           +-----------+--------+-----+--------+--------------------+-----------------+ CFA Prox   45                   multiphasic                           +-----------+--------+-----+--------+--------------------+-----------------+ CFA Distal 58                   multiphasic                           +-----------+--------+-----+--------+--------------------+-----------------+ DFA        56  multiphasic                           +-----------+--------+-----+--------+--------------------+-----------------+ SFA Prox   18                   biphasic to dampened                  +-----------+--------+-----+--------+--------------------+-----------------+ SFA Mid    21                   monophasic                            +-----------+--------+-----+--------+--------------------+-----------------+ SFA Distal 12                   monophasic                            +-----------+--------+-----+--------+--------------------+-----------------+ POP Prox   16                   monophasic                            +-----------+--------+-----+--------+--------------------+-----------------+ POP Distal 10                   monophasic                            +-----------+--------+-----+--------+--------------------+-----------------+ TP Trunk   19                   monophasic                            +-----------+--------+-----+--------+--------------------+-----------------+ ATA Prox   18                   monophasic          collaterals noted +-----------+--------+-----+--------+--------------------+-----------------+ ATA Mid    11                    monophasic          collaterals noted +-----------+--------+-----+--------+--------------------+-----------------+ ATA Distal 13                   monophasic                            +-----------+--------+-----+--------+--------------------+-----------------+ PTA Prox   19                   monophasic                            +-----------+--------+-----+--------+--------------------+-----------------+ PTA Mid    30                   monophasic                            +-----------+--------+-----+--------+--------------------+-----------------+ PTA Distal 49                   monophasic                            +-----------+--------+-----+--------+--------------------+-----------------+  PERO Prox  8                    monophasic                            +-----------+--------+-----+--------+--------------------+-----------------+ PERO Mid   13                   monophasic                            +-----------+--------+-----+--------+--------------------+-----------------+ PERO Distal14                   monophasic                            +-----------+--------+-----+--------+--------------------+-----------------+  Summary: Right: Triphasic waveforms noted in the common femoral, femoral, and popliteal arteries. Left: Multiphasic waveforms noted in the distal external iliac, common femoral, and profunda femoral arteries. Waveforms are biphasic in the proximal superficial artery, but are noted to change to dampened biphasic/monophasic in the proximal-mid segments. There is significant plaque noted in the distal superficial femoral and proximal popliteal arteries. Monophasic flow noted throughout the posterior tibial, anterior tibial, and peroneal arteries. Collaterals noted throughout the calf.  See table(s) above for measurements and observations.  Electronically signed by Fonda Rim on 10/09/2024 at 1:17:55 PM.    Final    VAS US  LOWER  EXTREMITY VENOUS (DVT) (7a-5p) Result Date: 10/09/2024  Lower Venous DVT Study Patient Name:  Jonathan Dickson  Date of Exam:   10/09/2024 Medical Rec #: [id number]      Accession #:    [id number] Date of Birth: 11-Oct-1955      Patient Gender: M Patient Age:   43 years Exam Location:  [facility] Procedure:      VAS US  LOWER EXTREMITY VENOUS (DVT) Referring Phys: [facility] PFEIFFER --------------------------------------------------------------------------------  Indications: Thigh and hip pain X 3 days.  Risk Factors: Remote history of DVT and PE. Anticoagulation: Coumadin . Comparison Study: Prior negative left LEV done 08/04/19. Prior negative bilateral                   LEV done 05/12/18 Performing Technologist: Alberta Lis RVS  Examination Guidelines: A complete evaluation includes B-mode imaging, spectral Doppler, color Doppler, and power Doppler as needed of all accessible portions of each vessel. Bilateral testing is considered an integral part of a complete examination. Limited examinations for reoccurring indications may be performed as noted. The reflux portion of the exam is performed with the patient in reverse Trendelenburg.  +---------+---------------+---------+-----------+----------+------------------+ RIGHT    CompressibilityPhasicitySpontaneityPropertiesThrombus Aging     +---------+---------------+---------+-----------+----------+------------------+ CFV      Full           Yes      Yes                  Rouleaux flow      +---------+---------------+---------+-----------+----------+------------------+ SFJ      Full                                                            +---------+---------------+---------+-----------+----------+------------------+ FV Prox  Partial  Age Indeterminate  +---------+---------------+---------+-----------+----------+------------------+ FV DistalPartial                                      ? acute on  chronic +---------+---------------+---------+-----------+----------+------------------+ PFV      Partial        Yes      No                   Acute              +---------+---------------+---------+-----------+----------+------------------+ POP      Partial        No       No                   Acute              +---------+---------------+---------+-----------+----------+------------------+ PTV      Partial                                      Age Indeterminate  +---------+---------------+---------+-----------+----------+------------------+ PERO     Partial                                      Age Indeterminate  +---------+---------------+---------+-----------+----------+------------------+   +---------+---------------+---------+-----------+----------+-------------------+ LEFT     CompressibilityPhasicitySpontaneityPropertiesThrombus Aging      +---------+---------------+---------+-----------+----------+-------------------+ CFV      Full           Yes      Yes                                      +---------+---------------+---------+-----------+----------+-------------------+ SFJ      Full                                                             +---------+---------------+---------+-----------+----------+-------------------+ FV Prox  Partial        Yes      No                   Acute               +---------+---------------+---------+-----------+----------+-------------------+ FV Mid   Partial                                      Acute               +---------+---------------+---------+-----------+----------+-------------------+ FV DistalPartial        No       No                   Acute               +---------+---------------+---------+-----------+----------+-------------------+ PFV      Partial        Yes      No                   Acute               +---------+---------------+---------+-----------+----------+-------------------+  POP      Partial        Yes      No                   Acute on chronic    +---------+---------------+---------+-----------+----------+-------------------+ PTV      Partial        No       No                   Acute               +---------+---------------+---------+-----------+----------+-------------------+ PERO     Partial        No       No                   Acute               +---------+---------------+---------+-----------+----------+-------------------+ Gastroc  Full                                                             +---------+---------------+---------+-----------+----------+-------------------+ EIV                     Yes      No                   patent by color and                                                       Doppler             +---------+---------------+---------+-----------+----------+-------------------+     Summary: RIGHT: - Findings consistent with acute deep vein thrombosis involving the right proximal profunda vein, and right popliteal vein.  - Findings consistent with age indeterminate deep vein thrombosis involving the right femoral vein, right posterior tibial veins, and right peroneal veins. Possible acute on chronic DVT noted distal femoral vein.  - No cystic structure found in the popliteal fossa.  LEFT: - Findings consistent with acute deep vein thrombosis involving the left femoral vein, left proximal profunda vein, left popliteal vein, left posterior tibial veins, and left peroneal veins.  - No cystic structure found in the popliteal fossa.  *See table(s) above for measurements and observations. Electronically signed by Fonda Rim on 10/09/2024 at 1:15:33 PM.    Final    DG Femur Min 2 Views Left Result Date: 10/08/2024 EXAM: 2 VIEW(S) XRAY OF THE LEFT FEMUR 10/08/2024 10:32:00 PM COMPARISON: None available. CLINICAL HISTORY: Pain FINDINGS: BONES AND JOINTS: Subcortical sclerosis of the left femoral head consistent with  avascular necrosis. No malalignment. SOFT TISSUES: Vascular calcifications. IMPRESSION: 1. No acute fracture. 2. 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 Electronically signed by: Greig Pique MD MD 10/08/2024 10:39 PM EST RP Workstation: [id number]   DG Hip Unilat W or Wo Pelvis 2-3 Views Left Result Date: 10/08/2024 EXAM: 2 OR MORE VIEW(S) XRAY OF THE UNILATERAL HIP 10/08/2024 10:32:00 PM COMPARISON: None available. CLINICAL HISTORY: Pain FINDINGS: BONES AND JOINTS: No acute fracture. No malalignment. Subtle sclerosis and lucency along the left femoral  head, possibly representing AVN. Mild joint space loss of the left hip. Mild degenerative changes of the sacroiliac joints. SOFT TISSUES: Vascular calcifications noted. IMPRESSION: 1. 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 2. Mild left hip osteoarthrosis. 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  BLE DVTs Unclear etiology/progression despite therapeutic INR, but can not exclude malignancy as source -Hematology consulted for [facility] per EDP; apprec eval/recs -Vascular surgery consulted; apprec eval/recs -IV heparin  gtt for now -F/u PSA  Avascular necrosis of L femoral head -PT/OT consulted; apprec eval/recs -Orthopedic surgery consulted; apprec eval/recs -Multimodal pain control w/ tylenol , gabapentin , robaxin , and oxycodone  prn  HFrEF -PTA Coreg  12.5mg  BID, atorvastatin  80mg  daily, Enresto 49-51mg  BID, lasix  20mg  daily (pta 40mg  prn) and Farxiga  10mg  daily   Advance Care Planning:   Code Status: Prior   Consults: Ortho, Vascular and  Hematology  Family Communication: Sister, Dagoberto  Severity of Illness: The appropriate patient status for this patient is INPATIENT. Inpatient status is judged to be reasonable and necessary in order to provide the required intensity of service to ensure the patient's safety. The patient's presenting symptoms, physical exam findings, and initial radiographic and laboratory data in the context of their chronic comorbidities is felt to place them at high risk for further clinical deterioration. Furthermore, it is not anticipated that the patient will be medically stable for discharge from the hospital within 2 midnights of admission.   * I certify that at the point of admission it is my clinical judgment that the patient will require inpatient hospital care spanning beyond 2 midnights from the point of admission due to high intensity of service, high risk for further deterioration and high frequency of surveillance required.*   ------- I spent 56 minutes reviewing previous notes, at the bedside counseling/discussing the treatment plan, and performing clinical documentation.  Author: Marsha Ada, MD 10/09/2024 2:30 PM  For on call review [email or web address].      [1]  Allergies Allergen Reactions   Peanut-Containing Drug Products Swelling   Penicillins Other (See Comments)    CONVULSIONS    Decadron  [Dexamethasone ] Itching   Ivp Dye [Iodinated Contrast Media]    "

## 2024-10-09 NOTE — ED Provider Notes (Signed)
 " [place] EMERGENCY DEPARTMENT AT [facility] Provider Note   CSN: [id number] Arrival date & time: 10/08/24  2141     Patient presents with: Left Thigh/Hip Pain    Jonathan Dickson is a 69 y.o. male.   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  Patient reports 3 days of fairly severe left lower extremity pain.  He identifies it as starting mostly concentrating somewhere in the posterior upper leg about the lower third and crossing down to about the upper third of the calf.  He reports it as an intense burning pain.  Patient reports the pain however does extend to encompass almost all of the leg coming up to the high back of the leg involving the lower leg and even down to the foot with burning pain.  He reports it is worse with weightbearing.  No injury.  He reports he is using a cane which she does not typically do.  Patient is chronically anticoagulated on Coumadin .  Patient reports he is compliant with his Coumadin .    Prior to Admission medications  Medication Sig Start Date End Date Taking? Authorizing Provider  acetaminophen  (TYLENOL ) 325 MG tablet Take 2 tablets (650 mg total) by mouth every 6 (six) hours as needed for mild pain (pain score 1-3) (or Fever >/= 101). 08/19/23   Hongalgi, Anand D, MD  albuterol  (VENTOLIN  HFA) 108 (90 Base) MCG/ACT inhaler INHALE 2 PUFFS INTO THE LUNGS EVERY 6 HOURS AS NEEDED FOR WHEEZING OR SHORTNESS OF BREATH Patient not taking: Reported on 09/07/2024 12/17/23   Parrett, Madelin RAMAN, NP  Aromatic Inhalants (VICKS VAPOINHALER) INHA Inhale 1 Dose into the lungs daily as needed (congestion).    [provider]  atorvastatin  (LIPITOR ) 80 MG tablet Take 1 tablet (80 mg total) by mouth daily. 10/07/24   Bensimhon, Toribio SAUNDERS, MD  carvedilol  (COREG ) 12.5 MG tablet TAKE 1 TABLET(12.5 MG) BY MOUTH TWICE DAILY WITH A MEAL. NEED FOLLOW UP APPOINTMENT FOR  ANYMORE REFILLS 12/19/23   Bensimhon, Toribio SAUNDERS, MD  Chlorphen-Phenyleph-ASA (ALKA-SELTZER PLUS COLD PO) Take 1-2 tablets by mouth 2 (two) times daily as needed (cold symptoms).     [provider]  cholecalciferol  (VITAMIN D3) 25 MCG (1000 UNIT) tablet Take 1,000 Units by mouth daily. 11/25/17   [provider]  Cyanocobalamin  (B-12) 1000 MCG SUBL Place 1,000 mcg under the tongue daily. 04/13/24   Kale, Gautam Kishore, MD  dapagliflozin  propanediol (FARXIGA ) 10 MG TABS tablet TAKE 1 TABLET(10 MG) BY MOUTH DAILY 06/01/24   Milford, Harlene HERO, FNP  enoxaparin  (LOVENOX ) 80 MG/0.8ML injection Inject 80 mg into the skin every 12 (twelve) hours.    [provider]  ENTRESTO  49-51 MG Take 1 tablet by mouth 2 (two) times daily. 04/26/24   Bensimhon, Toribio SAUNDERS, MD  EPINEPHrine  (NEFFY ) 2 MG/0.1ML SOLN Place 1 spray into the nose once as needed for up to 1 dose. 09/07/24   Fleming, Zelda W, NP  fluticasone  (FLONASE ) 50 MCG/ACT nasal spray SHAKE LIQUID AND USE 1 SPRAY IN EACH NOSTRIL DAILY 08/12/23   Desai, Nikita S, MD  furosemide  (LASIX ) 40 MG tablet TAKE 1 TABLET BY MOUTH EVERY DAY AS NEEDED 08/23/24   Bensimhon, Toribio SAUNDERS, MD  gabapentin  (NEURONTIN ) 300 MG capsule Take 300 mg by mouth daily as needed. 07/07/23   [provider]  metFORMIN  (GLUCOPHAGE ) 500 MG tablet TAKE 1 TABLET(500 MG) BY MOUTH  DAILY WITH BREAKFAST 04/22/24   Theotis Haze ORN, NP  Misc. Devices MISC by Does not apply route. Flutter valve daily    [provider]  spironolactone  (ALDACTONE ) 25 MG tablet Take 0.5 tablets (12.5 mg total) by mouth daily. 02/12/24   Bensimhon, Toribio SAUNDERS, MD  Tafamidis  (VYNDAMAX ) 61 MG CAPS Take 1 capsule (61 mg total) by mouth daily. 12/11/23   Bensimhon, Daniel R, MD  warfarin (COUMADIN ) 5 MG tablet TAKE 1 TO 2 TABLETS BY MOUTH DAILY AS DIRECTED BY ANTICOAGULATION CLINIC 09/20/24   Bensimhon, Toribio SAUNDERS, MD    Allergies: Peanut-containing drug products, Penicillins, Decadron   [dexamethasone ], and Ivp dye [iodinated contrast media]    Review of Systems  Updated Vital Signs BP 131/70 (BP Location: Right Arm)   Pulse 90   Temp 98.5 F (36.9 C)   Resp 17   SpO2 98%   Physical Exam Constitutional:      Comments: Alert nontoxic clear mental status.  No respiratory distress  HENT:     Mouth/Throat:     Pharynx: Oropharynx is clear.  Eyes:     Extraocular Movements: Extraocular movements intact.  Cardiovascular:     Rate and Rhythm: Normal rate and regular rhythm.  Pulmonary:     Effort: Pulmonary effort is normal.     Breath sounds: Normal breath sounds.  Abdominal:     Palpations: Abdomen is soft.     Comments: Patient endorses mild left lower abdominal pain.  No guarding no palpable mass.  No mass or fullness in the inguinal crease.  No rash no redness no soft tissue abnormality of the upper leg.  Musculoskeletal:     Comments: Bilateral lower extremities are symmetric without peripheral edema.  Calves are soft and pliable.  Patient however does endorse discomfort to compression of the left calf citing burning pain.  There is no effusion at the left knee.  He does however endorse discomfort to palpation of the popliteal fossa and the back of the left leg.  Both feet are symmetric.  The left foot is warm with no edema and no wounds.  Palpable pulses 2+ on the right foot for the DP.  Left foot is warm and has good cap refill but DP pulse is not manually palpable.  It is present and easily identifiable with hand-held Doppler.  Skin:    General: Skin is warm and dry.  Neurological:     General: No focal deficit present.     Mental Status: He is oriented to person, place, and time.     Motor: No weakness.     Coordination: Coordination normal.  Psychiatric:        Mood and Affect: Mood normal.     (all labs ordered are listed, but only abnormal results are displayed) Labs Reviewed - No data to display  EKG: None  Radiology: No results  found.    Procedures  CRITICAL CARE Performed by: Ludivina Shines   Total critical care time: 30 minutes  Critical care time was exclusive of separately billable procedures and treating other patients.  Critical care was necessary to treat or prevent imminent or life-threatening deterioration.  Critical care was time spent personally by me on the following activities: development of treatment plan with patient and/or surrogate as well as nursing, discussions with consultants, evaluation of patient's response to treatment, examination of patient, obtaining history from patient or surrogate, ordering and performing treatments and interventions, ordering and review of laboratory studies, ordering and review of radiographic  studies, pulse oximetry and re-evaluation of patient's condition.  Medications Ordered in the ED - No data to display                                  Medical Decision Making Amount and/or Complexity of Data Reviewed Labs: ordered. Radiology: ordered.  Risk Prescription drug management. Decision regarding hospitalization.  Patient presents as outlined with severe pain.  Will initiate diagnostic evaluation for both DVT as well as arterial obstruction based on the exam.  Patient has known history of DVT.  Although pulses not palpable it is present on hand-held Doppler and the foot is warm to the touch.  Will start with pain control and diagnostic evaluation.  Ultrasound positive for extensive DVT.  Consult Dr. Lanis vascular surgery.  We discussed the patient's history of present illness and exam.  He has reviewed diagnostic imaging.  At this time, arterial studies do not indicate critical arterial flow impediment and with ABI 0.88 which would be noninterventional for arterial occlusion.  Confirms patient does have extensive new venous clot.  Recommends discussing with hematology since patient is already therapeutic on INR.  Consult: Reviewed with Dr.Iruku.  Normally  speaking patient will get started on Lovenox  1 mg/kg twice daily given failure with warfarin.  However, with extensive intractable pain, may consider consult to interventional radiology to see if clot removal could be helpful.  In this case start heparin  until decisions are made if there will be anything interventional.  Dr.Iruku will do consult tomorrow morning.     Final diagnoses:  Acute deep vein thrombosis (DVT) of other specified vein of left lower extremity (HCC)  Intractable pain    ED Discharge Orders     None          Armenta Canning, MD 10/18/24 1629  "

## 2024-10-09 NOTE — Progress Notes (Signed)
 VASCULAR LAB    ABI has been performed.  See CV proc for preliminary results.   Tracee Mccreery, RVT 10/09/2024, 10:48 AM

## 2024-10-09 NOTE — Progress Notes (Signed)
 VASCULAR LAB    Bilateral lower extremity venous duplex has been performed.  See CV proc for preliminary results.   Laurabeth Yip, RVT 10/09/2024, 10:10 AM

## 2024-10-09 NOTE — ED Notes (Signed)
 RN used doppler to find bilateral dorsalis pedal pulses. Right foot 3+ and left foot is 2+.

## 2024-10-09 NOTE — Progress Notes (Signed)
 VASCULAR LAB    Left lower extremity arterial duplex has been performed.  See CV proc for preliminary results.   Manases Etchison, RVT 10/09/2024, 10:10 AM

## 2024-10-09 NOTE — Progress Notes (Signed)
 PHARMACY - ANTICOAGULATION CONSULT NOTE  Pharmacy Consult for heparin  Indication: DVT  Allergies[1]  Patient Measurements:    Vital Signs: Temp: 97.9 F (36.6 C) (01/10 1023) Temp Source: Oral (01/10 1023) BP: 133/85 (01/10 1023) Pulse Rate: 78 (01/10 1023)  Labs: Recent Labs    10/09/24 1107  HGB 14.9  HCT 45.5  PLT 187  LABPROT 30.9*  INR 2.8*  CREATININE 0.99    CrCl cannot be calculated (Unknown ideal weight.).   Medical History: Past Medical History:  Diagnosis Date   Abnormal TSH 02/2016   AICD (automatic cardioverter/defibrillator) present    St Jude   Anxiety    Blood clotting disorder    Bronchiectasis (HCC)    left lower lung   Cardiac amyloidosis (HCC)    Cervical disc disease    Chronic combined systolic and diastolic CHF (congestive heart failure) (HCC)    a. 01/2016 Echo: EF 25%, inf AK, diffuse sev HK, Gr1 DD, mild MR.   Coronary atherosclerosis    Depression    Diabetes mellitus without complication (HCC)    type 2   Dyspnea    with exertion   Dysrhythmia    Full thickness rotator cuff tear 06/2021   MRI   History of DVT (deep vein thrombosis) 08/04/2019   venous doppler US    History of hiatal hernia    Hx of adenomatous polyp of colon    Hyperglycemia    Hyperlipidemia    Hypertension    Hypertensive heart disease    Myocardial infarction (HCC)    greater than 5 yrs per patient on 08/06/23   NICM (nonischemic cardiomyopathy) (HCC)    a. 01/2016 Echo: EF 25%, inf AK, diffuse sev HK, Gr1 DD, mild MR; b. 01/2016 MV: EF 22%, no isch/infarct;  c. 02/2016 Cath: Nl cors, EF 35-45%.   Osteoarthritis of hips, bilateral    and hands   Paraseptal emphysema (HCC)    mild   Peanut allergy    Pneumonia    x 1   Pre-diabetes    hx but now DM 2   Presence of implantable cardioverter-defibrillator (ICD)    St Jude   S/P repair of ventral hernia    Seizures (HCC)    allergic with penicillin - only one seizure   Sensorineural hearing loss  11/25/2017   no hearing aids, patient denies this dx   Substance abuse (HCC)    none in 20 yrs per patient as of 08/06/23    Assessment: 68 YOM presenting with leg pain, acute on chronic DVT, on warfarin PTA for hx of DVT with INR 2.8 on admission.  Discussed with MD, hematology recommending heparin  for now  Goal of Therapy:  Heparin  level 0.3-0.7 units/ml Monitor platelets by anticoagulation protocol: Yes   Plan:  Heparin  gtt at 1000 units/hr, no bolus F/u 6 hour heparin  level, daily INR F/u hematology recs and long term [facility] plan   Dorn Poot, PharmD, [facility] Clinical Pharmacist ED Pharmacist Phone # [phone] 10/09/2024 1:48 PM       [1]  Allergies Allergen Reactions   Peanut-Containing Drug Products Swelling   Penicillins Other (See Comments)    CONVULSIONS    Decadron  [Dexamethasone ] Itching   Ivp Dye [Iodinated Contrast Media]

## 2024-10-09 NOTE — Progress Notes (Signed)
 PHARMACY - ANTICOAGULATION CONSULT NOTE  Pharmacy Consult for heparin  Indication: DVT  Allergies[1]  Patient Measurements: Height: 5' 6 (167.6 cm) Weight: 66 kg (145 lb 8.1 oz) IBW/kg (Calculated) : 63.8 HEPARIN  DW (KG): 66  Vital Signs: Temp: 98.3 F (36.8 C) (01/10 2003) Temp Source: Oral (01/10 1742) BP: 117/86 (01/10 2003) Pulse Rate: 83 (01/10 2003)  Labs: Recent Labs    10/09/24 1107 10/09/24 2137  HGB 14.9  --   HCT 45.5  --   PLT 187  --   LABPROT 30.9*  --   INR 2.8*  --   HEPARINUNFRC  --  0.42  CREATININE 0.99  --     Estimated Creatinine Clearance: 63.5 mL/min (by C-G formula based on SCr of 0.99 mg/dL).   Medical History: Past Medical History:  Diagnosis Date   Abnormal TSH 02/2016   AICD (automatic cardioverter/defibrillator) present    St Jude   Anxiety    Blood clotting disorder    Bronchiectasis (HCC)    left lower lung   Cardiac amyloidosis (HCC)    Cervical disc disease    Chronic combined systolic and diastolic CHF (congestive heart failure) (HCC)    a. 01/2016 Echo: EF 25%, inf AK, diffuse sev HK, Gr1 DD, mild MR.   Coronary atherosclerosis    Depression    Diabetes mellitus without complication (HCC)    type 2   Dyspnea    with exertion   Dysrhythmia    Full thickness rotator cuff tear 06/2021   MRI   History of DVT (deep vein thrombosis) 08/04/2019   venous doppler US    History of hiatal hernia    Hx of adenomatous polyp of colon    Hyperglycemia    Hyperlipidemia    Hypertension    Hypertensive heart disease    Myocardial infarction (HCC)    greater than 5 yrs per patient on 08/06/23   NICM (nonischemic cardiomyopathy) (HCC)    a. 01/2016 Echo: EF 25%, inf AK, diffuse sev HK, Gr1 DD, mild MR; b. 01/2016 MV: EF 22%, no isch/infarct;  c. 02/2016 Cath: Nl cors, EF 35-45%.   Osteoarthritis of hips, bilateral    and hands   Paraseptal emphysema (HCC)    mild   Peanut allergy    Pneumonia    x 1   Pre-diabetes    hx but now  DM 2   Presence of implantable cardioverter-defibrillator (ICD)    St Jude   S/P repair of ventral hernia    Seizures (HCC)    allergic with penicillin - only one seizure   Sensorineural hearing loss 11/25/2017   no hearing aids, patient denies this dx   Substance abuse (HCC)    none in 20 yrs per patient as of 08/06/23    Assessment: 68 YOM presenting with leg pain found to have acute on chronic DVT. On warfarin PTA for hx of DVT with INR 2.8 on admit (goal 2-3). Discussed with MD, hematology recommending heparin  for now.  Heparin  level 0.42, therapeutic  No issues with infusion or bleeding noted.   Goal of Therapy:  Heparin  level 0.3-0.7 units/ml Monitor platelets by anticoagulation protocol: Yes   Plan:  Continue heparin  gtt at 1000 units/hr Daily heparin  level and INR F/u hematology recs and long term [facility] plan  Thank you for allowing pharmacy to participate in this patient's care.  Leonor GORMAN Bash, PharmD Emergency Medicine Clinical Pharmacist 10/09/2024,10:15 PM         [1]  Allergies  Allergen Reactions   Peanut-Containing Drug Products Swelling   Penicillins Other (See Comments)    CONVULSIONS    Decadron  [Dexamethasone ] Itching   Ivp Dye [Iodinated Contrast Media]

## 2024-10-10 DIAGNOSIS — I43 Cardiomyopathy in diseases classified elsewhere: Secondary | ICD-10-CM | POA: Diagnosis not present

## 2024-10-10 DIAGNOSIS — E854 Organ-limited amyloidosis: Secondary | ICD-10-CM | POA: Diagnosis not present

## 2024-10-10 DIAGNOSIS — I824Y3 Acute embolism and thrombosis of unspecified deep veins of proximal lower extremity, bilateral: Secondary | ICD-10-CM

## 2024-10-10 DIAGNOSIS — M8700 Idiopathic aseptic necrosis of unspecified bone: Secondary | ICD-10-CM | POA: Diagnosis not present

## 2024-10-10 LAB — BASIC METABOLIC PANEL WITH GFR
Anion gap: 13 (ref 5–15)
BUN: 21 mg/dL (ref 8–23)
CO2: 22 mmol/L (ref 22–32)
Calcium: 9.1 mg/dL (ref 8.9–10.3)
Chloride: 101 mmol/L (ref 98–111)
Creatinine, Ser: 0.93 mg/dL (ref 0.61–1.24)
GFR, Estimated: >60 mL/min
Glucose, Bld: 108 mg/dL — ABNORMAL HIGH (ref 70–99)
Potassium: 3.8 mmol/L (ref 3.5–5.1)
Sodium: 135 mmol/L (ref 135–145)

## 2024-10-10 LAB — CBC
HCT: 43 % (ref 39.0–52.0)
Hemoglobin: 14.3 g/dL (ref 13.0–17.0)
MCH: 28.9 pg (ref 26.0–34.0)
MCHC: 33.3 g/dL (ref 30.0–36.0)
MCV: 86.9 fL (ref 80.0–100.0)
Platelets: 190 K/uL (ref 150–400)
RBC: 4.95 MIL/uL (ref 4.22–5.81)
RDW: 14.6 % (ref 11.5–15.5)
WBC: 8 K/uL (ref 4.0–10.5)
nRBC: 0 % (ref 0.0–0.2)

## 2024-10-10 LAB — HEPARIN LEVEL (UNFRACTIONATED): Heparin Unfractionated: 0.36 [IU]/mL (ref 0.30–0.70)

## 2024-10-10 LAB — HIV ANTIBODY (ROUTINE TESTING W REFLEX): HIV Screen 4th Generation wRfx: NONREACTIVE

## 2024-10-10 LAB — PSA: Prostatic Specific Antigen: 1.42 ng/mL (ref 0.00–4.00)

## 2024-10-10 LAB — PROTIME-INR
INR: 2.2 — ABNORMAL HIGH (ref 0.8–1.2)
Prothrombin Time: 25.1 s — ABNORMAL HIGH (ref 11.4–15.2)

## 2024-10-10 MED ORDER — PROCHLORPERAZINE EDISYLATE 10 MG/2ML IJ SOLN
5.0000 mg | Freq: Four times a day (QID) | INTRAMUSCULAR | Status: DC | PRN
  Administered 2024-10-10 – 2024-10-11 (×2): 5 mg via INTRAVENOUS
  Filled 2024-10-10 (×2): qty 2

## 2024-10-10 MED ORDER — TAFAMIDIS 61 MG PO CAPS
1.0000 | ORAL_CAPSULE | Freq: Every day | ORAL | Status: DC
  Administered 2024-10-10 – 2024-10-12 (×3): 61 mg via ORAL
  Filled 2024-10-10 (×3): qty 1

## 2024-10-10 NOTE — Progress Notes (Signed)
 " PROGRESS NOTE    Jonathan Dickson  FMW:[id number] DOB: 1955/11/30 DOA: 10/08/2024 PCP: Theotis Haze ORN, NP   Brief Narrative:  Jonathan GORKA is a 69 y.o. 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 Vascular Surgery, Orthopedic Surgery, and Medical Oncology consulted for further evaluation and workup.  Assessment and Plan:   BLE DVTs: Unclear etiology/progression despite therapeutic INR, but can not exclude malignancy as source.Hematology consulted for [facility] per EDP; apprec eval/recs and recommending continuing Heparin  gtt and considering Full Dose Enoxaparin  @ D/C -Vascular surgery consulted; apprec eval/recs and they had no plans for percutaneous mechanical thrombectomy at this time as it does not involve the common femoral artery and no significant swelling in the left leg -IV heparin  gtt for now and will need to run the cost of Eliquis . PT-INR went from 30.9-2.8 -> 25.1-2.2 -F/u PSA and was 1.42 - Has seen Dr. Melodye in hematology/Oncology and then Dr. Onesimo - Dr. Onesimo feels that his severe CHF with an EF of 25% could be a risk factor - Will need outpatient hypercoagulable workup.   Avascular Necrosis of L Femoral Head -PT/OT consulted; apprec eval/recs pending -Orthopedic surgery consulted; apprec eval/recs and they are recommending obtaining MRI of the hip which is still pending to evaluate if this is acute versus chronic AVN.;  The orthopedic team Dr. Ernie -Multimodal pain control w/ tylenol , gabapentin , robaxin , and oxycodone  prn   HFrEF: C/w Coreg  12.5mg  BID, atorvastatin  80mg  daily, Enresto 49-51mg  BID, lasix  20mg  daily (pta 40mg  prn) and Farxiga  10mg  daily.  Continue to monitor for signs and symptoms of volume overload. Holding  Spironolactone  12.5 mg p.o. daily  HLD: C/w Atorvastatin  80 mg po Daily   Metabolic Acidosis: Mild and Improved. CO2 is now 22, AG is 13, and Chloride Lvl is now 101. CTM and Trend and repeat CMP in the AM   Hx of IDA: Hgb/Hct Trend:  Recent Labs  Lab 10/09/24 1107 10/10/24 0536  HGB 14.9 14.3  HCT 45.5 43.0  MCV 88.7 86.9  -Continue monitor for signs and symptoms of bleeding as patient is anticoag with heparin  drip. No overt bleeding noted.  Repeat CBC in a.m. -Has had outpatient endoscopy and colonoscopy with did not explain the reason for his IDA but the team is recommending possible outpatient capsule endoscopy  Amyloid Cardiomyopathy: C/w Tafamidis  61 mg po Daily   Diabetes Mellitus 2: Continue Farxiga  10 mg p.o. daily.  Holding metformin  500 mg p.o. CTM and Trend CBG per Protocol. No results for input(s): GLUCAP in the last 720 hours.   DVT prophylaxis: Anticoagulated w/ Heparin  gtt    Code Status: Full Code Family Communication: No family present @ bedside   Disposition Plan:  Level of care: Med-Surg Status is: Inpatient Remains inpatient appropriate because: Needs further workup and evaluation and clearance by the specialists   Consultants:  Vascular Surgery Medical Oncology Orthopedic Surgery   Procedures:  As delineated as above  Antimicrobials:  Anti-infectives (From admission, onward)    None       Subjective: Seen and examined at bedside he was having some pain in his legs.  No nausea or vomiting.  Feels okay otherwise.  Denies any chest pain or shortness of breath.  No other  concerns or complaints at this time.  Objective: Vitals:   10/10/24 0801 10/10/24 1137 10/10/24 1552 10/10/24 2003  BP: 129/84 95/62 123/85 130/77  Pulse: 94 86 86 97  Resp: 18 19 18 18   Temp: 98 F (36.7 C) 98.2 F (36.8 C) 98.2 F (36.8 C) 98.2 F (36.8 C)  TempSrc: Oral  Oral   SpO2: 97% 95% 97% 95%  Weight:      Height:        Intake/Output Summary (Last 24  hours) at 10/10/2024 2052 Last data filed at 10/10/2024 1648 Gross per 24 hour  Intake 240 ml  Output 950 ml  Net -710 ml   Filed Weights   10/09/24 1500 10/09/24 1738  Weight: 70 kg 66 kg   Examination: Physical Exam:  Constitutional: Chronically ill-appearing African-American male in no acute distress Respiratory: Diminished to auscultation bilaterally, no wheezing, rales, rhonchi or crackles. Normal respiratory effort and patient is not tachypenic. No accessory muscle use.  Unlabored breathing Cardiovascular: RRR, no murmurs / rubs / gallops. S1 and S2 auscultated.  Extremity edema Abdomen: Soft, non-tender, non-distended. Bowel sounds positive.  GU: Deferred. Musculoskeletal: No clubbing / cyanosis of digits/nails. No joint deformity upper and lower extremities. Skin: No rashes, lesions, ulcers on a limited skin evaluation. No induration; Warm and dry.  Neurologic: CN 2-12 grossly intact with no focal deficits. Romberg sign and cerebellar reflexes not assessed.  Psychiatric: Normal judgment and insight. Alert and oriented x 3. Normal mood and appropriate affect.   Data Reviewed: I have personally reviewed following labs and imaging studies  CBC: Recent Labs  Lab 10/09/24 1107 10/10/24 0536  WBC 7.1 8.0  HGB 14.9 14.3  HCT 45.5 43.0  MCV 88.7 86.9  PLT 187 190   Basic Metabolic Panel: Recent Labs  Lab 10/09/24 1107 10/10/24 0536  NA 136 135  K 4.2 3.8  CL 103 101  CO2 20* 22  GLUCOSE 96 108*  BUN 16 21  CREATININE 0.99 0.93  CALCIUM  9.1 9.1   GFR: Estimated Creatinine Clearance: 67.6 mL/min (by C-G formula based on SCr of 0.93 mg/dL). Liver Function Tests: No results for input(s): AST, ALT, ALKPHOS, BILITOT, PROT, ALBUMIN in the last 168 hours. No results for input(s): LIPASE, AMYLASE in the last 168 hours. No results for input(s): AMMONIA in the last 168 hours. Coagulation Profile: Recent Labs  Lab 10/09/24 1107 10/10/24 0536  INR 2.8*  2.2*   Cardiac Enzymes: No results for input(s): CKTOTAL, CKMB, CKMBINDEX, TROPONINI in the last 168 hours. BNP (last 3 results) No results for input(s): PROBNP in the last 8760 hours. HbA1C: No results for input(s): HGBA1C in the last 72 hours. CBG: No results for input(s): GLUCAP in the last 168 hours. Lipid Profile: No results for input(s): CHOL, HDL, LDLCALC, TRIG, CHOLHDL, LDLDIRECT in the last 72 hours. Thyroid Function Tests: No results for input(s): TSH, T4TOTAL, FREET4, T3FREE, THYROIDAB in the last 72 hours. Anemia Panel: No results for input(s): VITAMINB12, FOLATE, FERRITIN, TIBC, IRON , RETICCTPCT in the last 72 hours. Sepsis Labs: No results for input(s): PROCALCITON, LATICACIDVEN in the last 168 hours.  No results found for this or any previous visit (from the past 240 hours).   Radiology Studies: VAS US  ABI WITH/WO TBI Result Date: 10/09/2024  LOWER EXTREMITY DOPPLER STUDY Patient Name:  Jonathan Dickson  Date of Exam:   10/09/2024 Medical Rec #: [id number]      Accession #:    [id number] Date of Birth: 07/14/1956  Patient Gender: M Patient Age:   39 years Exam Location:  [facility] Procedure:      VAS US  ABI WITH/WO TBI Referring Phys: [facility] PFEIFFER --------------------------------------------------------------------------------  Indications: Patient states he has been having acute, significant left              lower extremity rest pain X 3 days in the thigh and hip, which              nothing relieves. Patient endorses a history of chronic calf              claudication, stating he can walk about half a block before              resting, and              walking up hills/stairs makes it worse. High Risk Factors: Hypertension, hyperlipidemia, Diabetes, past history of                    smoking, prior MI, coronary artery disease. Other Factors: AICD, on Coumadin . CHF. NICM, Cardiac Amyloid, clotting                 disorder (patient is unsure of which one), Remote history of                multiple remote DVT and PE, bilaterally.  Comparison Study: Prior ABI done 01/09/2018 Performing Technologist: Rachel Pellet RVS  Examination Guidelines: A complete evaluation includes at minimum, Doppler waveform signals and systolic blood pressure reading at the level of bilateral brachial, anterior tibial, and posterior tibial arteries, when vessel segments are accessible. Bilateral testing is considered an integral part of a complete examination. Photoelectric Plethysmograph (PPG) waveforms and toe systolic pressure readings are included as required and additional duplex testing as needed. Limited examinations for reoccurring indications may be performed as noted.  ABI Findings: +---------+------------------+-----+-----------+--------+ Right    Rt Pressure (mmHg)IndexWaveform   Comment  +---------+------------------+-----+-----------+--------+ Brachial 139                    triphasic           +---------+------------------+-----+-----------+--------+ PTA      165               1.19 multiphasic         +---------+------------------+-----+-----------+--------+ DP       157               1.13 multiphasic         +---------+------------------+-----+-----------+--------+ Great Toe91                0.65                     +---------+------------------+-----+-----------+--------+ +---------+------------------+-----+---------+-------+ Left     Lt Pressure (mmHg)IndexWaveform Comment +---------+------------------+-----+---------+-------+ Brachial 126                    triphasic        +---------+------------------+-----+---------+-------+ PTA      113               0.81 biphasic         +---------+------------------+-----+---------+-------+ DP       122               0.88 biphasic         +---------+------------------+-----+---------+-------+ Burnetta Scale  0.45                   +---------+------------------+-----+---------+-------+ +-------+-----------+-----------+------------+------------+ ABI/TBIToday's ABIToday's TBIPrevious ABIPrevious TBI +-------+-----------+-----------+------------+------------+ Right  1.19       0.65       1.21        0.77         +-------+-----------+-----------+------------+------------+ Left   0.88       0.45       1.15        1.02         +-------+-----------+-----------+------------+------------+ Bilateral ABIs and TBIs appear decreased compared to prior study on 01/09/2018.  Summary: Right: Resting right ankle-brachial index is within normal range. Right toe pressure is >60 mmHg which suggests adequate perfusion for healing. Left: Resting left ankle-brachial index indicates mild left lower extremity arterial disease. The left toe-brachial index is abnormal.  *See table(s) above for measurements and observations.  Electronically signed by Fonda Rim on 10/09/2024 at 1:18:19 PM.    Final    VAS US  LOWER EXTREMITY ARTERIAL DUPLEX Result Date: 10/09/2024 LOWER EXTREMITY ARTERIAL DUPLEX STUDY Patient Name:  Jonathan Dickson  Date of Exam:   10/09/2024 Medical Rec #: [id number]      Accession #:    [id number] Date of Birth: 1956-02-10      Patient Gender: M Patient Age:   78 years Exam Location:  [facility] Procedure:      VAS US  LOWER EXTREMITY ARTERIAL DUPLEX Referring Phys: --------------------------------------------------------------------------------  Indications: Patient states he has been having acute, significant left lower              extremity rest pain X 3 days in the thigh and hip, which nothing              relieves. Patient endorses a history of chronic calf claudication,              stating he can walk about half a block before resting, and walking              up hills/stairs makes it worse. High Risk Factors: Hypertension, hyperlipidemia, Diabetes, past history of                    smoking, prior MI. Other  Factors: AICD, on Coumadin . CHF. NICM, Cardiac Amyloid, clotting disorder                (patient is unsure of which one), Remote history of mulitple                remote DVT and PE, bilaterally.  Current ABI: ABI: R:1.19 L:0.88              TBI: R:0.65 L:0.45 Comparison Study: No prior LEA on file Performing Technologist: Alberta Lis RVS  Examination Guidelines: A complete evaluation includes B-mode imaging, spectral Doppler, color Doppler, and power Doppler as needed of all accessible portions of each vessel. Bilateral testing is considered an integral part of a complete examination. Limited examinations for reoccurring indications may be performed as noted.  +-----------+--------+-----+--------+---------+-----------+ RIGHT      PSV cm/sRatioStenosisWaveform Comments    +-----------+--------+-----+--------+---------+-----------+ CFA Prox   79                   triphasic            +-----------+--------+-----+--------+---------+-----------+ SFA Prox   71  triphasic            +-----------+--------+-----+--------+---------+-----------+ SFA Distal 83                   triphasic            +-----------+--------+-----+--------+---------+-----------+ POP Prox   29                   triphasic            +-----------+--------+-----+--------+---------+-----------+ TP Trunk   30                   triphasic            +-----------+--------+-----+--------+---------+-----------+ PTA Mid    61                   triphasic            +-----------+--------+-----+--------+---------+-----------+ PTA Distal                               collaterals +-----------+--------+-----+--------+---------+-----------+ PERO Distal                              collaterals +-----------+--------+-----+--------+---------+-----------+  +-----------+--------+-----+--------+--------------------+-----------------+ LEFT       PSV cm/sRatioStenosisWaveform             Comments          +-----------+--------+-----+--------+--------------------+-----------------+ EIA Distal 98                   multiphasic                           +-----------+--------+-----+--------+--------------------+-----------------+ CFA Prox   45                   multiphasic                           +-----------+--------+-----+--------+--------------------+-----------------+ CFA Distal 58                   multiphasic                           +-----------+--------+-----+--------+--------------------+-----------------+ DFA        56                   multiphasic                           +-----------+--------+-----+--------+--------------------+-----------------+ SFA Prox   18                   biphasic to dampened                  +-----------+--------+-----+--------+--------------------+-----------------+ SFA Mid    21                   monophasic                            +-----------+--------+-----+--------+--------------------+-----------------+ SFA Distal 12                   monophasic                            +-----------+--------+-----+--------+--------------------+-----------------+  POP Prox   16                   monophasic                            +-----------+--------+-----+--------+--------------------+-----------------+ POP Distal 10                   monophasic                            +-----------+--------+-----+--------+--------------------+-----------------+ TP Trunk   19                   monophasic                            +-----------+--------+-----+--------+--------------------+-----------------+ ATA Prox   18                   monophasic          collaterals noted +-----------+--------+-----+--------+--------------------+-----------------+ ATA Mid    11                   monophasic          collaterals noted +-----------+--------+-----+--------+--------------------+-----------------+ ATA  Distal 13                   monophasic                            +-----------+--------+-----+--------+--------------------+-----------------+ PTA Prox   19                   monophasic                            +-----------+--------+-----+--------+--------------------+-----------------+ PTA Mid    30                   monophasic                            +-----------+--------+-----+--------+--------------------+-----------------+ PTA Distal 49                   monophasic                            +-----------+--------+-----+--------+--------------------+-----------------+ PERO Prox  8                    monophasic                            +-----------+--------+-----+--------+--------------------+-----------------+ PERO Mid   13                   monophasic                            +-----------+--------+-----+--------+--------------------+-----------------+ PERO Distal14                   monophasic                            +-----------+--------+-----+--------+--------------------+-----------------+  Summary: Right: Triphasic waveforms noted in the common femoral, femoral, and popliteal arteries.  Left: Multiphasic waveforms noted in the distal external iliac, common femoral, and profunda femoral arteries. Waveforms are biphasic in the proximal superficial artery, but are noted to change to dampened biphasic/monophasic in the proximal-mid segments. There is significant plaque noted in the distal superficial femoral and proximal popliteal arteries. Monophasic flow noted throughout the posterior tibial, anterior tibial, and peroneal arteries. Collaterals noted throughout the calf.  See table(s) above for measurements and observations.  Electronically signed by Fonda Rim on 10/09/2024 at 1:17:55 PM.    Final    VAS US  LOWER EXTREMITY VENOUS (DVT) (7a-5p) Result Date: 10/09/2024  Lower Venous DVT Study Patient Name:  Jonathan Dickson  Date of Exam:   10/09/2024  Medical Rec #: [id number]      Accession #:    [id number] Date of Birth: [name] 28, 1956      Patient Gender: M Patient Age:   61 years Exam Location:  [facility] Procedure:      VAS US  LOWER EXTREMITY VENOUS (DVT) Referring Phys: [facility] PFEIFFER --------------------------------------------------------------------------------  Indications: Thigh and hip pain X 3 days.  Risk Factors: Remote history of DVT and PE. Anticoagulation: Coumadin . Comparison Study: Prior negative left LEV done 08/04/19. Prior negative bilateral                   LEV done 05/12/18 Performing Technologist: Alberta Lis RVS  Examination Guidelines: A complete evaluation includes B-mode imaging, spectral Doppler, color Doppler, and power Doppler as needed of all accessible portions of each vessel. Bilateral testing is considered an integral part of a complete examination. Limited examinations for reoccurring indications may be performed as noted. The reflux portion of the exam is performed with the patient in reverse Trendelenburg.  +---------+---------------+---------+-----------+----------+------------------+ RIGHT    CompressibilityPhasicitySpontaneityPropertiesThrombus Aging     +---------+---------------+---------+-----------+----------+------------------+ CFV      Full           Yes      Yes                  Rouleaux flow      +---------+---------------+---------+-----------+----------+------------------+ SFJ      Full                                                            +---------+---------------+---------+-----------+----------+------------------+ FV Prox  Partial                                      Age Indeterminate  +---------+---------------+---------+-----------+----------+------------------+ FV DistalPartial                                      ? acute on chronic +---------+---------------+---------+-----------+----------+------------------+ PFV      Partial        Yes      No                    Acute              +---------+---------------+---------+-----------+----------+------------------+ POP      Partial        No       No  Acute              +---------+---------------+---------+-----------+----------+------------------+ PTV      Partial                                      Age Indeterminate  +---------+---------------+---------+-----------+----------+------------------+ PERO     Partial                                      Age Indeterminate  +---------+---------------+---------+-----------+----------+------------------+   +---------+---------------+---------+-----------+----------+-------------------+ LEFT     CompressibilityPhasicitySpontaneityPropertiesThrombus Aging      +---------+---------------+---------+-----------+----------+-------------------+ CFV      Full           Yes      Yes                                      +---------+---------------+---------+-----------+----------+-------------------+ SFJ      Full                                                             +---------+---------------+---------+-----------+----------+-------------------+ FV Prox  Partial        Yes      No                   Acute               +---------+---------------+---------+-----------+----------+-------------------+ FV Mid   Partial                                      Acute               +---------+---------------+---------+-----------+----------+-------------------+ FV DistalPartial        No       No                   Acute               +---------+---------------+---------+-----------+----------+-------------------+ PFV      Partial        Yes      No                   Acute               +---------+---------------+---------+-----------+----------+-------------------+ POP      Partial        Yes      No                   Acute on chronic     +---------+---------------+---------+-----------+----------+-------------------+ PTV      Partial        No       No                   Acute               +---------+---------------+---------+-----------+----------+-------------------+ PERO     Partial        No       No  Acute               +---------+---------------+---------+-----------+----------+-------------------+ Gastroc  Full                                                             +---------+---------------+---------+-----------+----------+-------------------+ EIV                     Yes      No                   patent by color and                                                       Doppler             +---------+---------------+---------+-----------+----------+-------------------+     Summary: RIGHT: - Findings consistent with acute deep vein thrombosis involving the right proximal profunda vein, and right popliteal vein.  - Findings consistent with age indeterminate deep vein thrombosis involving the right femoral vein, right posterior tibial veins, and right peroneal veins. Possible acute on chronic DVT noted distal femoral vein.  - No cystic structure found in the popliteal fossa.  LEFT: - Findings consistent with acute deep vein thrombosis involving the left femoral vein, left proximal profunda vein, left popliteal vein, left posterior tibial veins, and left peroneal veins.  - No cystic structure found in the popliteal fossa.  *See table(s) above for measurements and observations. Electronically signed by Fonda Rim on 10/09/2024 at 1:15:33 PM.    Final    DG Femur Min 2 Views Left Result Date: 10/08/2024 EXAM: 2 VIEW(S) XRAY OF THE LEFT FEMUR 10/08/2024 10:32:00 PM COMPARISON: None available. 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 No malalignment. SOFT TISSUES: Vascular calcifications.  IMPRESSION: 1. No acute fracture. 2. 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 Electronically signed by: Greig Pique MD MD 10/08/2024 10:39 PM EST RP Workstation: [id number]   DG Hip Unilat W or Wo Pelvis 2-3 Views Left Result Date: 10/08/2024 EXAM: 2 OR MORE VIEW(S) XRAY OF THE UNILATERAL HIP 10/08/2024 10:32:00 PM COMPARISON: None available. CLINICAL HISTORY: Pain FINDINGS: BONES AND JOINTS: No acute fracture. No malalignment. Subtle sclerosis and lucency along the left femoral head, possibly representing AVN. Mild joint space loss of the left hip. Mild degenerative changes of the sacroiliac joints. SOFT TISSUES: Vascular calcifications noted. IMPRESSION: 1. 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 2. Mild left hip osteoarthrosis. Electronically signed by: Greig Pique MD MD 10/08/2024 10:38 PM EST RP Workstation: [id number]   Scheduled Meds:  acetaminophen   1,000 mg Oral TID   atorvastatin   80 mg Oral Daily   carvedilol   12.5 mg Oral BID WC   dapagliflozin  propanediol  10 mg Oral Daily   furosemide   20 mg Oral Daily   gabapentin   300 mg Oral TID   methocarbamol   500 mg Oral TID   sacubitril -valsartan   1 tablet Oral BID   Tafamidis   1 capsule Oral Daily   Continuous Infusions:  heparin  1,050 Units/hr (10/10/24  1430)    LOS: 1 day   Alejandro Marker, DO [facility] Available via Epic secure chat 7am-7pm After these hours, please refer to coverage provider listed on [email or web address] 10/10/2024, 8:52 PM  "

## 2024-10-10 NOTE — Consult Note (Signed)
 Reason for Consult: left hip/LE pain Referring Physician: Sherrill, MD (Hospitalist)  Jonathan Dickson is an 69 y.o. male.  HPI: The patient experienced the onset of left leg pain three days prior to the visit, which began while walking in a thrift store after a few hours of activity. Initially, the patient thought it might be a pinched nerve or pulled muscle and attempted to alleviate symptoms with muscle relaxants, Tylenol , and Epsom salt baths, but found no relief. The patient visited the hospital after two nights of persistent pain without improvement. The patient has a history of heart problems, including multiple heart attacks and a weak heart, necessitating a defibrillator. 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   In the ED, pt AFVSS. Labs unremarkable. 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 ABI showed resting left ankle-brachial index indicates mild left lower extremity arterial disease w/ normal left toe-brachial index. BLE US  dopplers showed bilateral acute DVTs.  EDP consulted vascular surgery and requested medicine admission.  Vascular surgery has ruled out need for intervention in his LE at this point in time Ortho consulted to evaluate left hip based on X-ray findings  Complains of pain from ankle to groin, a burning pain:  Past Medical History:  Diagnosis Date   Abnormal TSH 02/2016   AICD (automatic cardioverter/defibrillator) present    St Jude   Anxiety    Blood clotting disorder    Bronchiectasis (HCC)    left lower lung   Cardiac amyloidosis (HCC)    Cervical disc disease    Chronic combined systolic and diastolic CHF (congestive heart failure) (HCC)    a. 01/2016 Echo: EF 25%,  inf AK, diffuse sev HK, Gr1 DD, mild MR.   Coronary atherosclerosis    Depression    Diabetes mellitus without complication (HCC)    type 2   Dyspnea    with exertion   Dysrhythmia    Full thickness rotator cuff tear 06/2021   MRI   History of DVT (deep vein thrombosis) 08/04/2019   venous doppler US    History of hiatal hernia    Hx of adenomatous polyp of colon    Hyperglycemia    Hyperlipidemia    Hypertension    Hypertensive heart disease    Myocardial infarction (HCC)    greater than 5 yrs per patient on 08/06/23   NICM (nonischemic cardiomyopathy) (HCC)    a. 01/2016 Echo: EF 25%, inf AK, diffuse sev HK, Gr1 DD, mild MR; b. 01/2016 MV: EF 22%, no isch/infarct;  c. 02/2016 Cath: Nl cors, EF 35-45%.   Osteoarthritis of hips, bilateral    and hands   Paraseptal emphysema (HCC)    mild   Peanut allergy    Pneumonia    x 1   Pre-diabetes    hx but now DM 2   Presence of implantable cardioverter-defibrillator (ICD)    St Jude   S/P repair of ventral hernia    Seizures (HCC)    allergic with penicillin - only one seizure   Sensorineural hearing loss 11/25/2017   no hearing aids, patient denies this dx   Substance abuse (HCC)    none in 20 yrs per patient as  of 08/06/23    Past Surgical History:  Procedure Laterality Date   ANTERIOR CERVICAL DECOMP/DISCECTOMY FUSION N/A 07/10/2016   Procedure: ANTERIOR CERVICAL DECOMPRESSION FUSION CERVICAL 4-5, CERVICAL 5-6, CERVICAL 6-7 WITH INSTRUMENTATION AND ALLOGRAFT;  Surgeon: Oneil Priestly, MD;  Location: [facility] OR;  Service: Orthopedics;  Laterality: N/A;   ANTERIOR CERVICAL DECOMP/DISCECTOMY FUSION N/A 04/19/2021   Procedure: ANTERIOR CERVICAL DECOMPRESSION FUSION CERVICAL 3- CERVICAL 4 WITH INSTRUMENTATION AND ALLOGRAFT;  Surgeon: Priestly Oneil, MD;  Location: [facility] OR;  Service: Orthopedics;  Laterality: N/A;   ANTERIOR CERVICAL DECOMP/DISCECTOMY FUSION N/A 08/14/2023   Procedure: ANTERIOR CERVICAL DECOMPRESSION FUSION CERVICAL 7 -  THORACIC 1 WITH INSTRUMENTATION AND ALLOGRAFT;  Surgeon: Priestly Oneil, MD;  Location: [facility] OR;  Service: Orthopedics;  Laterality: N/A;   BIOPSY  07/28/2023   Procedure: BIOPSY;  Surgeon: Leigh Elspeth SQUIBB, MD;  Location: [facility];  Service: Gastroenterology;;   CARDIAC CATHETERIZATION N/A 03/11/2016   Procedure: Left Heart Cath and Coronary Angiography;  Surgeon: Alm LELON Clay, MD;  Location: [facility] INVASIVE CV LAB;  Service: Cardiovascular;  Laterality: N/A;   COLONOSCOPY     COLONOSCOPY WITH PROPOFOL  N/A 07/28/2023   Procedure: COLONOSCOPY WITH PROPOFOL ;  Surgeon: Leigh Elspeth SQUIBB, MD;  Location: [facility];  Service: Gastroenterology;  Laterality: N/A;   ESOPHAGOGASTRODUODENOSCOPY (EGD) WITH PROPOFOL  N/A 07/28/2023   Procedure: ESOPHAGOGASTRODUODENOSCOPY (EGD) WITH PROPOFOL ;  Surgeon: Leigh Elspeth SQUIBB, MD;  Location: [facility];  Service: Gastroenterology;  Laterality: N/A;   HOT HEMOSTASIS N/A 07/28/2023   Procedure: HOT HEMOSTASIS (ARGON PLASMA COAGULATION/BICAP);  Surgeon: Leigh Elspeth SQUIBB, MD;  Location: [facility];  Service: Gastroenterology;  Laterality: N/A;   ICD IMPLANT N/A 06/17/2018   Procedure: ICD IMPLANT;  Surgeon: Inocencio Soyla Lunger, MD;  Location: [facility] INVASIVE CV LAB;  Service: Cardiovascular;  Laterality: N/A;   POLYPECTOMY  07/28/2023   Procedure: POLYPECTOMY;  Surgeon: Leigh Elspeth SQUIBB, MD;  Location: [facility];  Service: Gastroenterology;;   Thumb surgery Right    VENTRAL HERNIA REPAIR N/A 10/25/2019   Procedure: PRIMARY VENTRAL HERNIA REPAIR;  Surgeon: Signe Mitzie LABOR, MD;  Location: [facility];  Service: General;  Laterality: N/A;    Family History  Problem Relation Age of Onset   Diabetes Mother    Diabetes Sister    Clotting disorder Neg Hx    Lung disease Neg Hx    Colon cancer Neg Hx    Esophageal cancer Neg Hx    Stomach cancer Neg Hx    Pancreatic cancer Neg Hx    Colon polyps Neg Hx    Rectal cancer Neg Hx     Social  History:  reports that he quit smoking about 21 years ago. His smoking use included cigarettes. He started smoking about 39 years ago. He has a 27 pack-year smoking history. He has never used smokeless tobacco. He reports that he does not currently use drugs after having used the following drugs: Heroin, Marijuana, and Crack cocaine. He reports that he does not drink alcohol.  Allergies: Allergies[1]  Medications: I have reviewed the patient's current medications. Scheduled:  acetaminophen   1,000 mg Oral TID   atorvastatin   80 mg Oral Daily   carvedilol   12.5 mg Oral BID WC   dapagliflozin  propanediol  10 mg Oral Daily   furosemide   20 mg Oral Daily   gabapentin   300 mg Oral TID   methocarbamol   500 mg Oral TID   sacubitril -valsartan   1 tablet Oral BID    Results for orders placed or performed  during the hospital encounter of 10/08/24 (from the past 24 hours)  Heparin  level (unfractionated)     Status: None   Collection Time: 10/09/24  9:37 PM  Result Value Ref Range   Heparin  Unfractionated 0.42 0.30 - 0.70 IU/mL  Heparin  level (unfractionated)     Status: None   Collection Time: 10/10/24  5:36 AM  Result Value Ref Range   Heparin  Unfractionated 0.36 0.30 - 0.70 IU/mL  CBC     Status: None   Collection Time: 10/10/24  5:36 AM  Result Value Ref Range   WBC 8.0 4.0 - 10.5 K/uL   RBC 4.95 4.22 - 5.81 MIL/uL   Hemoglobin 14.3 13.0 - 17.0 g/dL   HCT 56.9 60.9 - 47.9 %   MCV 86.9 80.0 - 100.0 fL   MCH 28.9 26.0 - 34.0 pg   MCHC 33.3 30.0 - 36.0 g/dL   RDW 85.3 88.4 - 84.4 %   Platelets 190 150 - 400 K/uL   nRBC 0.0 0.0 - 0.2 %  Protime-INR     Status: Abnormal   Collection Time: 10/10/24  5:36 AM  Result Value Ref Range   Prothrombin Time 25.1 (H) 11.4 - 15.2 seconds   INR 2.2 (H) 0.8 - 1.2  Basic metabolic panel     Status: Abnormal   Collection Time: 10/10/24  5:36 AM  Result Value Ref Range   Sodium 135 135 - 145 mmol/L   Potassium 3.8 3.5 - 5.1 mmol/L   Chloride 101  98 - 111 mmol/L   CO2 22 22 - 32 mmol/L   Glucose, Bld 108 (H) 70 - 99 mg/dL   BUN 21 8 - 23 mg/dL   Creatinine, Ser 9.06 0.61 - 1.24 mg/dL   Calcium  9.1 8.9 - 10.3 mg/dL   GFR, Estimated >39 >39 mL/min   Anion gap 13 5 - 15  PSA     Status: None   Collection Time: 10/10/24  5:36 AM  Result Value Ref Range   Prostatic Specific Antigen 1.42 0.00 - 4.00 ng/mL   *Note: Due to a large number of results and/or encounters for the requested time period, some results have not been displayed. A complete set of results can be found in Results Review.     X-ray: Narrative & Impression  EXAM: 2 OR MORE VIEW(S) XRAY OF THE UNILATERAL HIP 10/08/2024 10:32:00 PM   COMPARISON: None available.   CLINICAL HISTORY: Pain   FINDINGS:   BONES AND JOINTS: No acute fracture. No malalignment. Subtle sclerosis and lucency along the left femoral head, possibly representing AVN. Mild joint space loss of the left hip. Mild degenerative changes of the sacroiliac joints.   SOFT TISSUES: Vascular calcifications noted.   IMPRESSION: 1. 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 2. Mild left hip osteoarthrosis.   Electronically signed by: Greig Pique MD MD 10/08/2024 10:38 PM EST RP    ROS: As per HPI  Blood pressure 95/62, pulse 86, temperature 98.2 F (36.8 C), resp. rate 19, height 5' 6 (1.676 m), weight 66 kg, SpO2 95%.  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  His radiographs do not reveal obvious collapse  and at least on the AP view there is evidence of sclerotic borders indicating more chronicity. WBAT at this point with mobility with walker Depending on MRI findings further treatment recommendations would be made related to this hospitalization versus other  Donnice JONETTA Car 10/10/2024, 12:30 PM         [1]  Allergies Allergen Reactions   Peanut-Containing Drug Products Swelling   Penicillins Other (See Comments)    CONVULSIONS    Decadron  [Dexamethasone ] Itching   Ivp Dye [Iodinated Contrast Media]

## 2024-10-10 NOTE — Hospital Course (Signed)
 Jonathan Dickson is a 69 y.o. 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 Vascular Surgery, Orthopedic Surgery, and Medical Oncology consulted for further evaluation and workup.  Assessment and Plan:   BLE DVTs: Unclear etiology/progression despite therapeutic INR, but can not exclude malignancy as source.Hematology consulted for [facility] per EDP; apprec eval/recs and recommending continuing Heparin  gtt and considering Full Dose Enoxaparin  @ D/C -Vascular surgery consulted; apprec eval/recs and they had no plans for percutaneous mechanical thrombectomy at this time as it does not involve the common femoral artery and no significant swelling in the left leg -IV heparin  gtt for now and will need to run the cost of Eliquis . PT-INR went from 30.9-2.8 -> 25.1-2.2 -F/u PSA and was 1.42 - Has seen Dr. Melodye in hematology/Oncology and then Dr. Onesimo - Dr. Onesimo feels that his severe CHF with an EF of 25% could be a risk factor - Will need outpatient hypercoagulable workup. - Oncology evaluating for malignancy and ordering a CT Abdomen pelvis w/ contrast and getting    Avascular Necrosis of L Femoral Head -PT/OT consulted; apprec eval/recs pending -Orthopedic surgery Dr. Ernie consulted; apprec eval/recs and they are recommending obtaining MRI of the hip which is still pending to evaluate if this is acute versus chronic AVN.;  -MRI done and showed Avascular necrosis of the superior and anterior left femoral head involving the majority of the weight-bearing articular surface with pronounced surrounding marrow edema extending through the femoral neck. No appreciable evidence of subchondral collapse. Small left hip joint effusion. Periarticular soft  tissue/intramuscular edema involving the left adductor and adjacent anterior compartment musculature. Mild marrow edema and subchondral cystic change of the left superior acetabulum. Large field-of-view coronal images demonstrate less pronounced avascular necrosis of the right superolateral femoral head without significant surrounding marrow edema.Mild joint space narrowing of the bilateral hips.  Low-grade partial-thickness undersurface tear of the left hamstring conjoint tendon origin. -Multimodal pain control w/ tylenol , gabapentin , robaxin , and oxycodone  prn   HFrEF: C/w Coreg  12.5mg  BID, atorvastatin  80mg  daily, Enresto 49-51mg  BID, lasix  20mg  daily (pta 40mg  prn) and Farxiga  10mg  daily.  Continue to monitor for signs and symptoms of volume overload. Holding Spironolactone  12.5 mg p.o. daily.  Intake/Output Summary (Last 24 hours) at 10/11/2024 1955 Last data filed at 10/11/2024 1355 Gross per 24 hour  Intake --  Output 700 ml  Net -700 ml    Hypokalemia: K+ is now 3.3. Replete w/ po KCL 40 mEQ x1 and po K Phos  Neutral 500 mg po BID x2. CTM and Replete as Necessary. Repeat CMP in the AM  Hypophosphatemia: Phos Level is now 2.3. Replete w/ po K Phos  Neutral 500 mg po BID x2. CTM & Trend & Repeat CMP in the AM  HLD: C/w Atorvastatin  80 mg po Daily   Metabolic Acidosis: Mild and Improved. CO2 is now 22, AG is 13, and Chloride Lvl is now 101. CTM and Trend and repeat CMP in the AM   Hx of IDA: Hgb/Hct Trend:  Recent Labs  Lab 10/09/24 1107 10/10/24 0536 10/11/24 0522  HGB 14.9 14.3 13.4  HCT 45.5 43.0 40.4  MCV 88.7 86.9 86.0  -Continue monitor for signs and symptoms of bleeding as patient is anticoag with heparin  drip. No  overt bleeding noted.  Repeat CBC in a.m. -Has had outpatient endoscopy and colonoscopy with did not explain the reason for his IDA but the team is recommending possible outpatient capsule endoscopy  Amyloid Cardiomyopathy: C/w Tafamidis  61 mg po Daily   Diabetes  Mellitus 2: Continue Farxiga  10 mg p.o. daily.  Holding metformin  500 mg p.o. CTM and Trend CBG per Protocol. No results for input(s): GLUCAP in the last 720 hours.  Hypoalbuminemia: Patient's Albumin Lvl is now 3.3. CTM & Trend & repeat CMP in the AM

## 2024-10-10 NOTE — Evaluation (Signed)
 Physical Therapy Evaluation Patient Details Name: Jonathan Dickson MRN: [id number] DOB: 04/22/1956 Today's Date: 10/10/2024  History of Present Illness  Pt is a 69 y.o. male admitted 1/9 with BLE DVTs and L hip AVN. 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 PTA pt lived alone in elevator accessible apartment, I/mod I mobility with occasional use of cane. Pt currently with functional limitations due to the deficits listed below (see PT Problem List). On eval, pt demo mod I bed mobility. Supervision transfers and ambulation 30' with RW. Antalgic gait pattern with distance limited by pain. Pt will benefit from acute skilled PT to increase their independence and safety with mobility to allow discharge. Pt reports plan for L THA during this hospitalization. Will assess follow up needs once surgical plan in confirmed.         If plan is discharge home, recommend the following: Assistance with cooking/housework   Can travel by private [employer] (2 wheels)  Recommendations for Other Services       Functional Status Assessment Patient has had a recent decline in their functional status and demonstrates the ability to make significant improvements in function in a reasonable and predictable amount of time.     Precautions / Restrictions Precautions Precautions: Fall Recall of Precautions/Restrictions: Intact      Mobility  Bed Mobility Overal bed mobility: Modified Independent             General bed mobility comments: HOB elevated, +rail    Transfers Overall transfer level: Needs assistance Equipment used: Rolling walker (2 wheels) Transfers: Sit to/from Stand Sit to Stand: Supervision           General transfer comment: increased time to power up due to pain     Ambulation/Gait Ambulation/Gait assistance: Supervision Gait Distance (Feet): 35 Feet Assistive device: Rolling walker (2 wheels) Gait Pattern/deviations: Step-to pattern, Step-through pattern, Decreased stride length, Antalgic, Decreased weight shift to left Gait velocity: decreased     General Gait Details: heavy reliance on RW due to pain with WB LLE, distance limited by pain  Stairs            Wheelchair Mobility     Tilt Bed    Modified Rankin (Stroke Patients Only)       Balance Overall balance assessment: Mild deficits observed, not formally tested                                           Pertinent Vitals/Pain Pain Assessment Pain Assessment: 0-10 Pain Score: 5  Pain Location: LLE Pain Descriptors / Indicators: Burning, Discomfort, Grimacing Pain Intervention(s): Limited activity within patient's tolerance, Monitored during session, Patient requesting pain meds-RN notified    Home Living Family/patient expects to be discharged to:: Private residence Living Arrangements: Alone Available Help at Discharge: Family;Available PRN/intermittently Type of Home: Apartment Home Access: Elevator       Home Layout: One level Home Equipment: Cane - single point      Prior Function Prior Level of Function : Independent/Modified Independent;Driving                     Extremity/Trunk Assessment   Upper Extremity Assessment Upper Extremity  Assessment: Overall WFL for tasks assessed    Lower Extremity Assessment Lower Extremity Assessment: LLE deficits/detail;Generalized weakness LLE: Unable to fully assess due to pain    Cervical / Trunk Assessment Cervical / Trunk Assessment: Normal  Communication   Communication Communication: No apparent difficulties    Cognition Arousal: Alert Behavior During Therapy: WFL for tasks assessed/performed   PT - Cognitive impairments: No apparent impairments                          Following commands: Intact       Cueing Cueing Techniques: Verbal cues     General Comments      Exercises     Assessment/Plan    PT Assessment Patient needs continued PT services  PT Problem List Decreased mobility;Decreased activity tolerance;Pain;Decreased knowledge of use of DME       PT Treatment Interventions DME instruction;Therapeutic exercise;Gait training;Balance training;Functional mobility training;Therapeutic activities;Patient/family education    PT Goals (Current goals can be found in the Care Plan section)  Acute Rehab PT Goals Patient Stated Goal: decrease pain PT Goal Formulation: With patient Time For Goal Achievement: 10/24/24 Potential to Achieve Goals: Good    Frequency Min 2X/week     Co-evaluation               AM-PAC PT 6 Clicks Mobility  Outcome Measure Help needed turning from your back to your side while in a flat bed without using bedrails?: None Help needed moving from lying on your back to sitting on the side of a flat bed without using bedrails?: None Help needed moving to and from a bed to a chair (including a wheelchair)?: A Little Help needed standing up from a chair using your arms (e.g., wheelchair or bedside chair)?: A Little Help needed to walk in hospital room?: A Little Help needed climbing 3-5 steps with a railing? : A Lot 6 Click Score: 19    End of Session Equipment Utilized During Treatment: Gait belt Activity Tolerance: Patient limited by pain Patient left: in bed;with call bell/phone within reach Nurse Communication: Patient requests pain meds;Mobility status PT Visit Diagnosis: Difficulty in walking, not elsewhere classified (R26.2);Pain Pain - Right/Left: Left Pain - part of body: Hip    Time: 9098-9075 PT Time Calculation (min) (ACUTE ONLY): 23 min   Charges:   PT Evaluation $PT Eval Moderate Complexity: 1 Mod   PT General Charges $$ ACUTE PT VISIT: 1 Visit         Sari MATSU., PT   Office # [phone]   Erven Sari Shaker 10/10/2024, 9:56 AM

## 2024-10-10 NOTE — Consult Note (Signed)
 " Hospital Consult    Reason for Consult: Peripheral arterial disease with concern for rest pain versus claudication and nonpalpable pulse in the left foot Requesting Physician: Dr. Ludivina Hosteller MRN #:  [id number]  History of Present Illness: This is a 69 y.o. male with history history of hypercoagulable disorder,, lower extremity DVT bilaterally, as well as history below.  Vascular surgery was called as the patient presented with a 3-day history of left lower extremity pain, specifically in the left posterior thigh radiating down the leg.  Pain at rest.  Denies sensorimotor loss in the foot.  Notes claudication symptoms at baseline, occurring after 100+ yards.  States this is different.  Will he woke up with the symptoms, and nothing has improved the pain.    Has history of avascular necrosis of the left hip  Past Medical History:  Diagnosis Date   Abnormal TSH 02/2016   AICD (automatic cardioverter/defibrillator) present    St Jude   Anxiety    Blood clotting disorder    Bronchiectasis (HCC)    left lower lung   Cardiac amyloidosis (HCC)    Cervical disc disease    Chronic combined systolic and diastolic CHF (congestive heart failure) (HCC)    a. 01/2016 Echo: EF 25%, inf AK, diffuse sev HK, Gr1 DD, mild MR.   Coronary atherosclerosis    Depression    Diabetes mellitus without complication (HCC)    type 2   Dyspnea    with exertion   Dysrhythmia    Full thickness rotator cuff tear 06/2021   MRI   History of DVT (deep vein thrombosis) 08/04/2019   venous doppler US    History of hiatal hernia    Hx of adenomatous polyp of colon    Hyperglycemia    Hyperlipidemia    Hypertension    Hypertensive heart disease    Myocardial infarction (HCC)    greater than 5 yrs per patient on 08/06/23   NICM (nonischemic cardiomyopathy) (HCC)    a. 01/2016 Echo: EF 25%, inf AK, diffuse sev HK, Gr1 DD, mild MR; b. 01/2016 MV: EF 22%, no isch/infarct;  c. 02/2016 Cath: Nl cors, EF 35-45%.    Osteoarthritis of hips, bilateral    and hands   Paraseptal emphysema (HCC)    mild   Peanut allergy    Pneumonia    x 1   Pre-diabetes    hx but now DM 2   Presence of implantable cardioverter-defibrillator (ICD)    St Jude   S/P repair of ventral hernia    Seizures (HCC)    allergic with penicillin - only one seizure   Sensorineural hearing loss 11/25/2017   no hearing aids, patient denies this dx   Substance abuse (HCC)    none in 20 yrs per patient as of 08/06/23    Past Surgical History:  Procedure Laterality Date   ANTERIOR CERVICAL DECOMP/DISCECTOMY FUSION N/A 07/10/2016   Procedure: ANTERIOR CERVICAL DECOMPRESSION FUSION CERVICAL 4-5, CERVICAL 5-6, CERVICAL 6-7 WITH INSTRUMENTATION AND ALLOGRAFT;  Surgeon: Oneil Priestly, MD;  Location: [facility];  Service: Orthopedics;  Laterality: N/A;   ANTERIOR CERVICAL DECOMP/DISCECTOMY FUSION N/A 04/19/2021   Procedure: ANTERIOR CERVICAL DECOMPRESSION FUSION CERVICAL 3- CERVICAL 4 WITH INSTRUMENTATION AND ALLOGRAFT;  Surgeon: Priestly Oneil, MD;  Location: [facility];  Service: Orthopedics;  Laterality: N/A;   ANTERIOR CERVICAL DECOMP/DISCECTOMY FUSION N/A 08/14/2023   Procedure: ANTERIOR CERVICAL DECOMPRESSION FUSION CERVICAL 7 - THORACIC 1 WITH INSTRUMENTATION AND ALLOGRAFT;  Surgeon: Priestly Oneil, MD;  Location: [facility];  Service: Orthopedics;  Laterality: N/A;   BIOPSY  07/28/2023   Procedure: BIOPSY;  Surgeon: Leigh Elspeth SQUIBB, MD;  Location: [facility];  Service: Gastroenterology;;   CARDIAC CATHETERIZATION N/A 03/11/2016   Procedure: Left Heart Cath and Coronary Angiography;  Surgeon: Alm LELON Clay, MD;  Location: [facility] INVASIVE CV LAB;  Service: Cardiovascular;  Laterality: N/A;   COLONOSCOPY     COLONOSCOPY WITH PROPOFOL  N/A 07/28/2023   Procedure: COLONOSCOPY WITH PROPOFOL ;  Surgeon: Leigh Elspeth SQUIBB, MD;  Location: [facility];  Service: Gastroenterology;  Laterality: N/A;   ESOPHAGOGASTRODUODENOSCOPY (EGD) WITH PROPOFOL  N/A  07/28/2023   Procedure: ESOPHAGOGASTRODUODENOSCOPY (EGD) WITH PROPOFOL ;  Surgeon: Leigh Elspeth SQUIBB, MD;  Location: [facility];  Service: Gastroenterology;  Laterality: N/A;   HOT HEMOSTASIS N/A 07/28/2023   Procedure: HOT HEMOSTASIS (ARGON PLASMA COAGULATION/BICAP);  Surgeon: Leigh Elspeth SQUIBB, MD;  Location: [facility];  Service: Gastroenterology;  Laterality: N/A;   ICD IMPLANT N/A 06/17/2018   Procedure: ICD IMPLANT;  Surgeon: Inocencio Soyla Lunger, MD;  Location: [facility] INVASIVE CV LAB;  Service: Cardiovascular;  Laterality: N/A;   POLYPECTOMY  07/28/2023   Procedure: POLYPECTOMY;  Surgeon: Leigh Elspeth SQUIBB, MD;  Location: [facility];  Service: Gastroenterology;;   Thumb surgery Right    VENTRAL HERNIA REPAIR N/A 10/25/2019   Procedure: PRIMARY VENTRAL HERNIA REPAIR;  Surgeon: Signe Mitzie LABOR, MD;  Location: [facility];  Service: General;  Laterality: N/A;    Allergies[1]  Prior to Admission medications  Medication Sig Start Date End Date Taking? Authorizing Provider  acetaminophen  (TYLENOL ) 325 MG tablet Take 2 tablets (650 mg total) by mouth every 6 (six) hours as needed for mild pain (pain score 1-3) (or Fever >/= 101). 08/19/23  Yes Hongalgi, Trenda BIRCH, MD  albuterol  (VENTOLIN  HFA) 108 (90 Base) MCG/ACT inhaler INHALE 2 PUFFS INTO THE LUNGS EVERY 6 HOURS AS NEEDED FOR WHEEZING OR SHORTNESS OF BREATH 12/17/23  Yes Parrett, Tammy S, NP  Aromatic Inhalants (VICKS VAPOINHALER) INHA Inhale 1 Dose into the lungs daily as needed (congestion).   Yes [provider]  atorvastatin  (LIPITOR ) 80 MG tablet Take 1 tablet (80 mg total) by mouth daily. 10/07/24  Yes Bensimhon, Toribio SAUNDERS, MD  carvedilol  (COREG ) 12.5 MG tablet TAKE 1 TABLET(12.5 MG) BY MOUTH TWICE DAILY WITH A MEAL. NEED FOLLOW UP APPOINTMENT FOR ANYMORE REFILLS 12/19/23  Yes Bensimhon, Toribio SAUNDERS, MD  Chlorphen-Phenyleph-ASA (ALKA-SELTZER PLUS COLD PO) Take 1-2 tablets by mouth 2 (two) times daily as needed (cold symptoms).     Yes [provider]  cholecalciferol  (VITAMIN D3) 25 MCG (1000 UNIT) tablet Take 1,000 Units by mouth daily. 11/25/17  Yes [provider]  cyanocobalamin  (VITAMIN B12) 1000 MCG tablet Take 1,000 mcg by mouth daily.   Yes [provider]  dapagliflozin  propanediol (FARXIGA ) 10 MG TABS tablet TAKE 1 TABLET(10 MG) BY MOUTH DAILY 06/01/24  Yes Milford, Burgess, FNP  ENTRESTO  49-51 MG Take 1 tablet by mouth 2 (two) times daily. 04/26/24  Yes Bensimhon, Toribio SAUNDERS, MD  EPINEPHrine  (NEFFY ) 2 MG/0.1ML SOLN Place 1 spray into the nose once as needed for up to 1 dose. 09/07/24  Yes Fleming, Zelda W, NP  fluticasone  (FLONASE ) 50 MCG/ACT nasal spray SHAKE LIQUID AND USE 1 SPRAY IN EACH NOSTRIL DAILY Patient taking differently: Place 1 spray into both nostrils as needed for allergies. 08/12/23  Yes Meade Verdon RAMAN, MD  furosemide  (LASIX ) 40 MG tablet TAKE 1 TABLET BY MOUTH EVERY DAY AS NEEDED 08/23/24  Yes Bensimhon, Toribio SAUNDERS, MD  gabapentin  (NEURONTIN ) 300 MG capsule Take 300 mg by mouth daily as needed. 07/07/23  Yes [provider]  metFORMIN  (GLUCOPHAGE ) 500 MG tablet TAKE 1 TABLET(500 MG) BY MOUTH DAILY WITH BREAKFAST 04/22/24  Yes Fleming, Zelda W, NP  spironolactone  (ALDACTONE ) 25 MG tablet Take 0.5 tablets (12.5 mg total) by mouth daily. 02/12/24  Yes Bensimhon, Toribio SAUNDERS, MD  Tafamidis  (VYNDAMAX ) 61 MG CAPS Take 1 capsule (61 mg total) by mouth daily. 12/11/23  Yes Bensimhon, Daniel R, MD  warfarin (COUMADIN ) 5 MG tablet TAKE 1 TO 2 TABLETS BY MOUTH DAILY AS DIRECTED BY ANTICOAGULATION CLINIC Patient taking differently: Take 5-10 mg by mouth See admin instructions. Take one tablet by mouth on Tues, Thurs, Sat, Sun and two tablets by mouth on Mon, Wed, Fri 09/20/24  Yes Bensimhon, Toribio SAUNDERS, MD  Misc. Devices MISC by Does not apply route. Flutter valve daily    [provider]    Social History   Socioeconomic History   Marital status: Widowed    Spouse name: Not  on file   Number of children: 3   Years of education: Not on file   Highest education level: GED or equivalent  Occupational History   Not on file  Tobacco Use   Smoking status: Former    Current packs/day: 0.00    Average packs/day: 1.5 packs/day for 18.0 years (27.0 ttl pk-yrs)    Types: Cigarettes    Start date: 03/14/1985    Quit date: 03/15/2003    Years since quitting: 21.5   Smokeless tobacco: Never  Vaping Use   Vaping status: Never Used  Substance and Sexual Activity   Alcohol use: No    Comment: former, none since 2004   Drug use: Not Currently    Types: Heroin, Marijuana, Crack cocaine    Comment: former, none since 2004.   Sexual activity: Not Currently  Other Topics Concern   Not on file  Social History Narrative   Not on file   Social Drivers of Health   Tobacco Use: Medium Risk (10/09/2024)   Patient History    Smoking Tobacco Use: Former    Smokeless Tobacco Use: Never    Passive Exposure: Not on file  Financial Resource Strain: Low Risk (09/06/2024)   Overall Financial Resource Strain (CARDIA)    Difficulty of Paying Living Expenses: Not hard at all  Food Insecurity: No Food Insecurity (10/09/2024)   Epic    Worried About Programme Researcher, Broadcasting/film/video in the Last Year: Never true    Ran Out of Food in the Last Year: Never true  Transportation Needs: No Transportation Needs (10/09/2024)   Epic    Lack of Transportation (Medical): No    Lack of Transportation (Non-Medical): No  Physical Activity: Insufficiently Active (05/06/2023)   Exercise Vital Sign    Days of Exercise per Week: 3 days    Minutes of Exercise per Session: 30 min  Stress: No Stress Concern Present (05/06/2023)   [employer] of Occupational Health - Occupational Stress Questionnaire    Feeling of Stress : Not at all  Social Connections: Moderately Integrated (10/09/2024)   Social Connection and Isolation Panel    Frequency of Communication with Friends and Family: More than three times a week     Frequency of Social Gatherings with Friends and Family: Three times a week    Attends Religious Services: More than 4 times per year    Active Member of Clubs or  Organizations: Yes    Attends Engineer, Structural: More than 4 times per year    Marital Status: Widowed  Recent Concern: Social Connections - Moderately Isolated (09/06/2024)   Social Connection and Isolation Panel    Frequency of Communication with Friends and Family: More than three times a week    Frequency of Social Gatherings with Friends and Family: More than three times a week    Attends Religious Services: 1 to 4 times per year    Active Member of [employer] or Organizations: No    Attends Banker Meetings: Not on file    Marital Status: Widowed  Intimate Partner Violence: Not At Risk (10/09/2024)   Epic    Fear of Current or Ex-Partner: No    Emotionally Abused: No    Physically Abused: No    Sexually Abused: No  Depression (PHQ2-9): Low Risk (09/07/2024)   Depression (PHQ2-9)    PHQ-2 Score: 0  Alcohol Screen: Low Risk (05/06/2023)   Alcohol Screen    Last Alcohol Screening Score (AUDIT): 0  Housing: Low Risk (10/09/2024)   Epic    Unable to Pay for Housing in the Last Year: No    Number of Times Moved in the Last Year: 0    Homeless in the Last Year: No  Utilities: Not At Risk (10/09/2024)   Epic    Threatened with loss of utilities: No  Health Literacy: Adequate Health Literacy (05/06/2023)   B1300 Health Literacy    Frequency of need for help with medical instructions: Rarely   Family History  Problem Relation Age of Onset   Diabetes Mother    Diabetes Sister    Clotting disorder Neg Hx    Lung disease Neg Hx    Colon cancer Neg Hx    Esophageal cancer Neg Hx    Stomach cancer Neg Hx    Pancreatic cancer Neg Hx    Colon polyps Neg Hx    Rectal cancer Neg Hx     ROS: Otherwise negative unless mentioned in HPI  Physical Examination  Vitals:   10/10/24 0505 10/10/24 0801  BP:  137/87 129/84  Pulse: 93 94  Resp: 17 18  Temp: 97.6 F (36.4 C) 98 F (36.7 C)  SpO2: 96% 97%   Body mass index is 23.48 kg/m.  General:  WDWN in NAD Gait: Not observed HENT: WNL, normocephalic Pulmonary: normal non-labored breathing, without Rales, rhonchi,  wheezing Cardiac: regular Abdomen:  soft, NT/ND, no masses Skin: without rashes Vascular Exam/Pulses: 1+ PT, multiphasic signals on Doppler Extremities: without ischemic changes, without Gangrene , without cellulitis; without open wounds;  Musculoskeletal: no muscle wasting or atrophy  Neurologic: A&O X 3;  No focal weakness or paresthesias are detected; speech is fluent/normal Psychiatric:  The pt has Normal affect. Lymph:  Unremarkable  CBC    Component Value Date/Time   WBC 8.0 10/10/2024 0536   RBC 4.95 10/10/2024 0536   HGB 14.3 10/10/2024 0536   HGB 15.1 09/07/2024 1524   HCT 43.0 10/10/2024 0536   HCT 47.5 09/07/2024 1524   PLT 190 10/10/2024 0536   PLT 186 09/07/2024 1524   MCV 86.9 10/10/2024 0536   MCV 89 09/07/2024 1524   MCH 28.9 10/10/2024 0536   MCHC 33.3 10/10/2024 0536   RDW 14.6 10/10/2024 0536   RDW 14.5 09/07/2024 1524   LYMPHSABS 2.0 09/07/2024 1524   MONOABS 0.4 04/12/2024 0950   EOSABS 0.2 09/07/2024 1524   BASOSABS 0.1 09/07/2024 1524  BMET    Component Value Date/Time   NA 135 10/10/2024 0536   NA 139 09/07/2024 1524   K 3.8 10/10/2024 0536   CL 101 10/10/2024 0536   CO2 22 10/10/2024 0536   GLUCOSE 108 (H) 10/10/2024 0536   BUN 21 10/10/2024 0536   BUN 16 09/07/2024 1524   CREATININE 0.93 10/10/2024 0536   CREATININE 1.09 04/12/2024 0950   CREATININE 0.98 03/21/2016 0953   CALCIUM  9.1 10/10/2024 0536   GFRNONAA >60 10/10/2024 0536   GFRNONAA >60 04/12/2024 0950   GFRNONAA >89 10/04/2015 1708   GFRAA 62 07/21/2020 1235   GFRAA >89 10/04/2015 1708    COAGS: Lab Results  Component Value Date   INR 2.2 (H) 10/10/2024   INR 2.8 (H) 10/09/2024   INR 2.4 08/31/2024        ASSESSMENT/PLAN: This is a 69 y.o. male with what appears to be musculoskeletal/neuropathic pain in the left thigh, radiating down into the leg.  Imaging was reviewed.  The patient has new DVTs.  He was anticoagulated on warfarin, and came in with an INR of 2.8.  I am surprised he has new DVTs with this amount of anticoagulation on board.  I am unsure as to the next steps, possibly increasing his INR level to 3 as he has already failed DOAC previously.  I think this decision would be best coming from hematology oncology.  No plans for percutaneous mechanical thrombectomy at this time as it does not involve the common femoral artery.  No significant swelling in the left leg.  From an arterial standpoint, patient has mildly depressed ABI.  No concerns for critical limb ischemia with rest pain.     Fonda FORBES Rim MD MS [facility] [phone] 10/10/2024  9:01 AM     [1]  Allergies Allergen Reactions   Peanut-Containing Drug Products Swelling   Penicillins Other (See Comments)    CONVULSIONS    Decadron  [Dexamethasone ] Itching   Ivp Dye [Iodinated Contrast Media]    "

## 2024-10-10 NOTE — Progress Notes (Addendum)
 PHARMACY - ANTICOAGULATION CONSULT NOTE  Pharmacy Consult for heparin  Indication: DVT  Allergies[1]  Patient Measurements: Height: 5' 6 (167.6 cm) Weight: 66 kg (145 lb 8.1 oz) IBW/kg (Calculated) : 63.8 HEPARIN  DW (KG): 66  Vital Signs: Temp: 97.6 F (36.4 C) (01/11 0505) Temp Source: Oral (01/11 0505) BP: 137/87 (01/11 0505) Pulse Rate: 93 (01/11 0505)  Labs: Recent Labs    10/09/24 1107 10/09/24 2137 10/10/24 0536  HGB 14.9  --  14.3  HCT 45.5  --  43.0  PLT 187  --  190  LABPROT 30.9*  --  25.1*  INR 2.8*  --  2.2*  HEPARINUNFRC  --  0.42 0.36  CREATININE 0.99  --  0.93    Estimated Creatinine Clearance: 67.6 mL/min (by C-G formula based on SCr of 0.93 mg/dL).   Medical History: Past Medical History:  Diagnosis Date   Abnormal TSH 02/2016   AICD (automatic cardioverter/defibrillator) present    St Jude   Anxiety    Blood clotting disorder    Bronchiectasis (HCC)    left lower lung   Cardiac amyloidosis (HCC)    Cervical disc disease    Chronic combined systolic and diastolic CHF (congestive heart failure) (HCC)    a. 01/2016 Echo: EF 25%, inf AK, diffuse sev HK, Gr1 DD, mild MR.   Coronary atherosclerosis    Depression    Diabetes mellitus without complication (HCC)    type 2   Dyspnea    with exertion   Dysrhythmia    Full thickness rotator cuff tear 06/2021   MRI   History of DVT (deep vein thrombosis) 08/04/2019   venous doppler US    History of hiatal hernia    Hx of adenomatous polyp of colon    Hyperglycemia    Hyperlipidemia    Hypertension    Hypertensive heart disease    Myocardial infarction (HCC)    greater than 5 yrs per patient on 08/06/23   NICM (nonischemic cardiomyopathy) (HCC)    a. 01/2016 Echo: EF 25%, inf AK, diffuse sev HK, Gr1 DD, mild MR; b. 01/2016 MV: EF 22%, no isch/infarct;  c. 02/2016 Cath: Nl cors, EF 35-45%.   Osteoarthritis of hips, bilateral    and hands   Paraseptal emphysema (HCC)    mild   Peanut allergy     Pneumonia    x 1   Pre-diabetes    hx but now DM 2   Presence of implantable cardioverter-defibrillator (ICD)    St Jude   S/P repair of ventral hernia    Seizures (HCC)    allergic with penicillin - only one seizure   Sensorineural hearing loss 11/25/2017   no hearing aids, patient denies this dx   Substance abuse (HCC)    none in 20 yrs per patient as of 08/06/23    Assessment: 68 YOM presenting with leg pain found to have acute on chronic DVT. On warfarin PTA for hx of DVT with INR 2.8 on admit (goal 2-3). Discussed with MD, hematology recommending heparin  for now.  Heparin  level 0.36, therapeutic but trending down. Will give slight dose increase to keep patient in therapeutic range. CBC stable.  INR 2.2 No issues with infusion or bleeding noted.   Goal of Therapy:  Heparin  level 0.3-0.7 units/ml Monitor platelets by anticoagulation protocol: Yes   Plan:  Increase heparin  gtt at 1050 units/hr Daily heparin  level and INR F/u hematology recs and long term [facility] plan  Thank you for allowing pharmacy to  participate in this patient's care.  Elma Fail, PharmD PGY1 Clinical Pharmacist [facility]  10/10/2024 7:15 AM           [1]  Allergies Allergen Reactions   Peanut-Containing Drug Products Swelling   Penicillins Other (See Comments)    CONVULSIONS    Decadron  [Dexamethasone ] Itching   Ivp Dye [Iodinated Contrast Media]

## 2024-10-10 NOTE — Progress Notes (Signed)
 "   HEMATOLOGY/ONCOLOGY PHONE VISIT NOTE  Date of Service: .04/13/2024   Patient Care Team: Theotis Haze ORN, NP as PCP - General (Nurse Practitioner) Court Dorn PARAS, MD as PCP - Cardiology (Cardiology) Inocencio Soyla Lunger, MD as PCP - Electrophysiology (Cardiology) Bensimhon, Toribio SAUNDERS, MD as PCP - Advanced Heart Failure (Cardiology) Onesimo Emaline Brink, MD as Consulting Physician (Hematology) [facility]  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  At presentation his INR was therapeutic, 2.8.  Further evaluation of leg pain showed acute DVT in the right proximal profunda vein, right popliteal vein, age-indeterminate DVT involving the right femoral vein, right posterior tibial vein and right peroneal vein.  Possible acute on chronic DVT noted distal femoral vein.  Left leg showed acute DVT involving the left femoral vein, left proximal profunda vein, left popliteal vein, left posterior tibial vein and left peroneal vein.  He is currently on heparin  for anticoagulation.  He denies any complaints other than the leg pain.  He had an endoscopy and colonoscopy about a year ago.   Endoscopy and colonoscopy back in October 2024 showed 6 cm hiatal hernia, some friable gastric mucosa, diverticulosis, no obvious evidence of malignancy.  Other pertinent history was his history of iron  deficiency anemia for which she was getting iron  infusions.  No new cough, chest pain, shortness of breath, unintentional weight loss, hematochezia, melena, hematuria or other concerns for an underlying malignancy.  He denies any family history of hypercoagulable disorders.  He appears quite comfortable today, pain is  well-controlled, heparin  ongoing.  Rest of the pertinent 10 point ROS reviewed and negative  MEDICAL HISTORY:  Past Medical History:  Diagnosis Date   Abnormal TSH 02/2016   AICD (automatic cardioverter/defibrillator) present    St Jude   Anxiety    Blood clotting disorder    Bronchiectasis (HCC)    left lower lung   Cardiac amyloidosis (HCC)    Cervical disc disease    Chronic combined systolic and diastolic CHF (congestive heart failure) (HCC)    a. 01/2016 Echo: EF 25%, inf AK, diffuse sev HK, Gr1 DD, mild MR.   Coronary atherosclerosis    Depression    Diabetes mellitus without complication (HCC)    type 2   Dyspnea    with exertion   Dysrhythmia    Full thickness rotator cuff tear 06/2021   MRI   History of DVT (deep vein thrombosis) 08/04/2019   venous doppler US    History of hiatal hernia    Hx of adenomatous polyp of colon    Hyperglycemia    Hyperlipidemia    Hypertension    Hypertensive heart disease    Myocardial infarction (HCC)    greater than 5 yrs per patient on 08/06/23   NICM (nonischemic cardiomyopathy) (HCC)    a. 01/2016 Echo: EF 25%, inf AK, diffuse sev HK, Gr1 DD, mild MR; b. 01/2016 MV: EF 22%, no isch/infarct;  c. 02/2016 Cath: Nl cors, EF 35-45%.   Osteoarthritis of hips, bilateral    and hands   Paraseptal emphysema (HCC)    mild   Peanut allergy    Pneumonia    x 1   Pre-diabetes    hx but now DM 2   Presence  of implantable cardioverter-defibrillator (ICD)    St Jude   S/P repair of ventral hernia    Seizures (HCC)    allergic with penicillin - only one seizure   Sensorineural hearing loss 11/25/2017   no hearing aids, patient denies this dx   Substance abuse (HCC)    none in 20 yrs per patient as of 08/06/23    SURGICAL HISTORY: Past Surgical History:  Procedure Laterality Date   ANTERIOR CERVICAL DECOMP/DISCECTOMY FUSION N/A 07/10/2016   Procedure: ANTERIOR CERVICAL DECOMPRESSION FUSION CERVICAL 4-5, CERVICAL 5-6, CERVICAL 6-7 WITH  INSTRUMENTATION AND ALLOGRAFT;  Surgeon: Oneil Priestly, MD;  Location: [facility];  Service: Orthopedics;  Laterality: N/A;   ANTERIOR CERVICAL DECOMP/DISCECTOMY FUSION N/A 04/19/2021   Procedure: ANTERIOR CERVICAL DECOMPRESSION FUSION CERVICAL 3- CERVICAL 4 WITH INSTRUMENTATION AND ALLOGRAFT;  Surgeon: Priestly Oneil, MD;  Location: [facility];  Service: Orthopedics;  Laterality: N/A;   ANTERIOR CERVICAL DECOMP/DISCECTOMY FUSION N/A 08/14/2023   Procedure: ANTERIOR CERVICAL DECOMPRESSION FUSION CERVICAL 7 - THORACIC 1 WITH INSTRUMENTATION AND ALLOGRAFT;  Surgeon: Priestly Oneil, MD;  Location: [facility];  Service: Orthopedics;  Laterality: N/A;   BIOPSY  07/28/2023   Procedure: BIOPSY;  Surgeon: Leigh Elspeth SQUIBB, MD;  Location: [facility];  Service: Gastroenterology;;   CARDIAC CATHETERIZATION N/A 03/11/2016   Procedure: Left Heart Cath and Coronary Angiography;  Surgeon: Alm LELON Clay, MD;  Location: [facility] INVASIVE CV LAB;  Service: Cardiovascular;  Laterality: N/A;   COLONOSCOPY     COLONOSCOPY WITH PROPOFOL  N/A 07/28/2023   Procedure: COLONOSCOPY WITH PROPOFOL ;  Surgeon: Leigh Elspeth SQUIBB, MD;  Location: [facility];  Service: Gastroenterology;  Laterality: N/A;   ESOPHAGOGASTRODUODENOSCOPY (EGD) WITH PROPOFOL  N/A 07/28/2023   Procedure: ESOPHAGOGASTRODUODENOSCOPY (EGD) WITH PROPOFOL ;  Surgeon: Leigh Elspeth SQUIBB, MD;  Location: [facility];  Service: Gastroenterology;  Laterality: N/A;   HOT HEMOSTASIS N/A 07/28/2023   Procedure: HOT HEMOSTASIS (ARGON PLASMA COAGULATION/BICAP);  Surgeon: Leigh Elspeth SQUIBB, MD;  Location: [facility];  Service: Gastroenterology;  Laterality: N/A;   ICD IMPLANT N/A 06/17/2018   Procedure: ICD IMPLANT;  Surgeon: Inocencio Soyla Lunger, MD;  Location: [facility] INVASIVE CV LAB;  Service: Cardiovascular;  Laterality: N/A;   POLYPECTOMY  07/28/2023   Procedure: POLYPECTOMY;  Surgeon: Leigh Elspeth SQUIBB, MD;  Location: [facility];  Service: Gastroenterology;;   Thumb  surgery Right    VENTRAL HERNIA REPAIR N/A 10/25/2019   Procedure: PRIMARY VENTRAL HERNIA REPAIR;  Surgeon: Signe Mitzie LABOR, MD;  Location: [facility];  Service: General;  Laterality: N/A;    SOCIAL HISTORY: Social History   Socioeconomic History   Marital status: Widowed    Spouse name: Not on file   Number of children: 3   Years of education: Not on file   Highest education level: GED or equivalent  Occupational History   Not on file  Tobacco Use   Smoking status: Former    Current packs/day: 0.00    Average packs/day: 1.5 packs/day for 18.0 years (27.0 ttl pk-yrs)    Types: Cigarettes    Start date: 03/14/1985    Quit date: 03/15/2003    Years since quitting: 21.5   Smokeless tobacco: Never  Vaping Use   Vaping status: Never Used  Substance and Sexual Activity   Alcohol use: No    Comment: former, none since 2004   Drug use: Not Currently    Types: Heroin, Marijuana, Crack cocaine    Comment: former, none since 2004.   Sexual activity: Not Currently  Other Topics Concern  Not on file  Social History Narrative   Not on file   Social Drivers of Health   Tobacco Use: Medium Risk (10/09/2024)   Patient History    Smoking Tobacco Use: Former    Smokeless Tobacco Use: Never    Passive Exposure: Not on file  Financial Resource Strain: Low Risk (09/06/2024)   Overall Financial Resource Strain (CARDIA)    Difficulty of Paying Living Expenses: Not hard at all  Food Insecurity: No Food Insecurity (10/09/2024)   Epic    Worried About Programme Researcher, Broadcasting/film/video in the Last Year: Never true    Ran Out of Food in the Last Year: Never true  Transportation Needs: No Transportation Needs (10/09/2024)   Epic    Lack of Transportation (Medical): No    Lack of Transportation (Non-Medical): No  Physical Activity: Insufficiently Active (05/06/2023)   Exercise Vital Sign    Days of Exercise per Week: 3 days    Minutes of Exercise per Session: 30 min  Stress: No Stress Concern Present  (05/06/2023)   [employer] of Occupational Health - Occupational Stress Questionnaire    Feeling of Stress : Not at all  Social Connections: Moderately Integrated (10/09/2024)   Social Connection and Isolation Panel    Frequency of Communication with Friends and Family: More than three times a week    Frequency of Social Gatherings with Friends and Family: Three times a week    Attends Religious Services: More than 4 times per year    Active Member of Clubs or Organizations: Yes    Attends Banker Meetings: More than 4 times per year    Marital Status: Widowed  Recent Concern: Social Connections - Moderately Isolated (09/06/2024)   Social Connection and Isolation Panel    Frequency of Communication with Friends and Family: More than three times a week    Frequency of Social Gatherings with Friends and Family: More than three times a week    Attends Religious Services: 1 to 4 times per year    Active Member of [employer] or Organizations: No    Attends Banker Meetings: Not on file    Marital Status: Widowed  Intimate Partner Violence: Not At Risk (10/09/2024)   Epic    Fear of Current or Ex-Partner: No    Emotionally Abused: No    Physically Abused: No    Sexually Abused: No  Depression (PHQ2-9): Low Risk (09/07/2024)   Depression (PHQ2-9)    PHQ-2 Score: 0  Alcohol Screen: Low Risk (05/06/2023)   Alcohol Screen    Last Alcohol Screening Score (AUDIT): 0  Housing: Low Risk (10/09/2024)   Epic    Unable to Pay for Housing in the Last Year: No    Number of Times Moved in the Last Year: 0    Homeless in the Last Year: No  Utilities: Not At Risk (10/09/2024)   Epic    Threatened with loss of utilities: No  Health Literacy: Adequate Health Literacy (05/06/2023)   B1300 Health Literacy    Frequency of need for help with medical instructions: Rarely    FAMILY HISTORY: Family History  Problem Relation Age of Onset   Diabetes Mother    Diabetes Sister     Clotting disorder Neg Hx    Lung disease Neg Hx    Colon cancer Neg Hx    Esophageal cancer Neg Hx    Stomach cancer Neg Hx    Pancreatic cancer Neg Hx  Colon polyps Neg Hx    Rectal cancer Neg Hx     ALLERGIES:  is allergic to peanut-containing drug products, penicillins, decadron  [dexamethasone ], and ivp dye [iodinated contrast media].  MEDICATIONS:  Current Facility-Administered Medications  Medication Dose Route Frequency Provider Last Rate Last Admin   acetaminophen  (TYLENOL ) tablet 1,000 mg  1,000 mg Oral TID Georgina Basket, MD   1,000 mg at 10/10/24 0801   atorvastatin  (LIPITOR ) tablet 80 mg  80 mg Oral Daily Georgina Basket, MD   80 mg at 10/10/24 0800   carvedilol  (COREG ) tablet 12.5 mg  12.5 mg Oral BID WC Georgina Basket, MD   12.5 mg at 10/10/24 0800   dapagliflozin  propanediol (FARXIGA ) tablet 10 mg  10 mg Oral Daily Georgina Basket, MD   10 mg at 10/10/24 0800   furosemide  (LASIX ) tablet 20 mg  20 mg Oral Daily Georgina Basket, MD   20 mg at 10/10/24 0800   gabapentin  (NEURONTIN ) capsule 300 mg  300 mg Oral TID Georgina Basket, MD   300 mg at 10/10/24 0800   heparin  ADULT infusion 100 units/mL (25000 units/250mL)  1,050 Units/hr Intravenous Continuous Alveria Modest, RPH 10.5 mL/hr at 10/10/24 0759 1,050 Units/hr at 10/10/24 0759   HYDROmorphone  (DILAUDID ) injection 1 mg  1 mg Intravenous Q1H PRN Armenta Canning, MD   1 mg at 10/09/24 1402   methocarbamol  (ROBAXIN ) tablet 500 mg  500 mg Oral TID Georgina Basket, MD   500 mg at 10/10/24 0800   Oral care mouth rinse  15 mL Mouth Rinse PRN Georgina Basket, MD       oxyCODONE  (Oxy IR/ROXICODONE ) immediate release tablet 5 mg  5 mg Oral Q4H PRN Moore, Harlan, MD   5 mg at 10/10/24 9057   sacubitril -valsartan  (ENTRESTO ) 49-51 mg per tablet  1 tablet Oral BID Georgina Basket, MD   1 tablet at 10/10/24 0800    REVIEW OF SYSTEMS:    10 Point review of Systems was done is negative except as noted above.   PHYSICAL EXAMINATION:   He appears  well, no acute distress, bilateral lower extremities without any swelling.  No cervical, axillary adenopathy.  Anterior lung fields clear.  Heart rate and rhythm regular No abdominal masses, palpable hepatosplenomegaly.   LABORATORY DATA:  I have reviewed the data as listed  .    Latest Ref Rng & Units 10/10/2024    5:36 AM 10/09/2024   11:07 AM 09/07/2024    3:24 PM  CBC  WBC 4.0 - 10.5 K/uL 8.0  7.1  5.6   Hemoglobin 13.0 - 17.0 g/dL 85.6  85.0  84.8   Hematocrit 39.0 - 52.0 % 43.0  45.5  47.5   Platelets 150 - 400 K/uL 190  187  186     .    Latest Ref Rng & Units 10/10/2024    5:36 AM 10/09/2024   11:07 AM 09/07/2024    3:24 PM  CMP  Glucose 70 - 99 mg/dL 891  96  88   BUN 8 - 23 mg/dL 21  16  16    Creatinine 0.61 - 1.24 mg/dL 9.06  9.00  8.90   Sodium 135 - 145 mmol/L 135  136  139   Potassium 3.5 - 5.1 mmol/L 3.8  4.2  4.3   Chloride 98 - 111 mmol/L 101  103  103   CO2 22 - 32 mmol/L 22  20  23    Calcium  8.9 - 10.3 mg/dL 9.1  9.1  9.9  Total Protein 6.0 - 8.5 g/dL   6.7   Total Bilirubin 0.0 - 1.2 mg/dL   0.3   Alkaline Phos 47 - 123 IU/L   80   AST 0 - 40 IU/L   20   ALT 0 - 44 IU/L   14    . Lab Results  Component Value Date   IRON  45 04/12/2024   TIBC 368 04/12/2024   IRONPCTSAT 12 (L) 04/12/2024   (Iron  and TIBC)  Lab Results  Component Value Date   FERRITIN 25 04/12/2024      RADIOGRAPHIC STUDIES: I have personally reviewed the radiological images as listed and agreed with the findings in the report. VAS US  ABI WITH/WO TBI Result Date: 10/09/2024  LOWER EXTREMITY DOPPLER STUDY Patient Name:  JONH MCQUEARY  Date of Exam:   10/09/2024 Medical Rec #: [id number]      Accession #:    [id number] Date of Birth: 12-17-1955      Patient Gender: M Patient Age:   31 years Exam Location:  [facility] Procedure:      VAS US  ABI WITH/WO TBI Referring Phys: [facility] PFEIFFER --------------------------------------------------------------------------------   Indications: Patient states he has been having acute, significant left              lower extremity rest pain X 3 days in the thigh and hip, which              nothing relieves. Patient endorses a history of chronic calf              claudication, stating he can walk about half a block before              resting, and              walking up hills/stairs makes it worse. High Risk Factors: Hypertension, hyperlipidemia, Diabetes, past history of                    smoking, prior MI, coronary artery disease. Other Factors: AICD, on Coumadin . CHF. NICM, Cardiac Amyloid, clotting                disorder (patient is unsure of which one), Remote history of                multiple remote DVT and PE, bilaterally.  Comparison Study: Prior ABI done 01/09/2018 Performing Technologist: Rachel Pellet RVS  Examination Guidelines: A complete evaluation includes at minimum, Doppler waveform signals and systolic blood pressure reading at the level of bilateral brachial, anterior tibial, and posterior tibial arteries, when vessel segments are accessible. Bilateral testing is considered an integral part of a complete examination. Photoelectric Plethysmograph (PPG) waveforms and toe systolic pressure readings are included as required and additional duplex testing as needed. Limited examinations for reoccurring indications may be performed as noted.  ABI Findings: +---------+------------------+-----+-----------+--------+ Right    Rt Pressure (mmHg)IndexWaveform   Comment  +---------+------------------+-----+-----------+--------+ Brachial 139                    triphasic           +---------+------------------+-----+-----------+--------+ PTA      165               1.19 multiphasic         +---------+------------------+-----+-----------+--------+ DP       157               1.13 multiphasic         +---------+------------------+-----+-----------+--------+  Great Toe91                0.65                      +---------+------------------+-----+-----------+--------+ +---------+------------------+-----+---------+-------+ Left     Lt Pressure (mmHg)IndexWaveform Comment +---------+------------------+-----+---------+-------+ Brachial 126                    triphasic        +---------+------------------+-----+---------+-------+ PTA      113               0.81 biphasic         +---------+------------------+-----+---------+-------+ DP       122               0.88 biphasic         +---------+------------------+-----+---------+-------+ Great Toe63                0.45                  +---------+------------------+-----+---------+-------+ +-------+-----------+-----------+------------+------------+ ABI/TBIToday's ABIToday's TBIPrevious ABIPrevious TBI +-------+-----------+-----------+------------+------------+ Right  1.19       0.65       1.21        0.77         +-------+-----------+-----------+------------+------------+ Left   0.88       0.45       1.15        1.02         +-------+-----------+-----------+------------+------------+ Bilateral ABIs and TBIs appear decreased compared to prior study on 01/09/2018.  Summary: Right: Resting right ankle-brachial index is within normal range. Right toe pressure is >60 mmHg which suggests adequate perfusion for healing. Left: Resting left ankle-brachial index indicates mild left lower extremity arterial disease. The left toe-brachial index is abnormal.  *See table(s) above for measurements and observations.  Electronically signed by Fonda Rim on 10/09/2024 at 1:18:19 PM.    Final    VAS US  LOWER EXTREMITY ARTERIAL DUPLEX Result Date: 10/09/2024 LOWER EXTREMITY ARTERIAL DUPLEX STUDY Patient Name:  MARGUIS MATHIESON  Date of Exam:   10/09/2024 Medical Rec #: [id number]      Accession #:    [id number] Date of Birth: 1956/06/08      Patient Gender: M Patient Age:   58 years Exam Location:  [facility] Procedure:      VAS US  LOWER  EXTREMITY ARTERIAL DUPLEX Referring Phys: --------------------------------------------------------------------------------  Indications: Patient states he has been having acute, significant left lower              extremity rest pain X 3 days in the thigh and hip, which nothing              relieves. Patient endorses a history of chronic calf claudication,              stating he can walk about half a block before resting, and walking              up hills/stairs makes it worse. High Risk Factors: Hypertension, hyperlipidemia, Diabetes, past history of                    smoking, prior MI. Other Factors: AICD, on Coumadin . CHF. NICM, Cardiac Amyloid, clotting disorder                (patient is unsure of which one), Remote history of mulitple  remote DVT and PE, bilaterally.  Current ABI: ABI: R:1.19 L:0.88              TBI: R:0.65 L:0.45 Comparison Study: No prior LEA on file Performing Technologist: Alberta Lis RVS  Examination Guidelines: A complete evaluation includes B-mode imaging, spectral Doppler, color Doppler, and power Doppler as needed of all accessible portions of each vessel. Bilateral testing is considered an integral part of a complete examination. Limited examinations for reoccurring indications may be performed as noted.  +-----------+--------+-----+--------+---------+-----------+ RIGHT      PSV cm/sRatioStenosisWaveform Comments    +-----------+--------+-----+--------+---------+-----------+ CFA Prox   79                   triphasic            +-----------+--------+-----+--------+---------+-----------+ SFA Prox   71                   triphasic            +-----------+--------+-----+--------+---------+-----------+ SFA Distal 83                   triphasic            +-----------+--------+-----+--------+---------+-----------+ POP Prox   29                   triphasic            +-----------+--------+-----+--------+---------+-----------+ TP Trunk    30                   triphasic            +-----------+--------+-----+--------+---------+-----------+ PTA Mid    61                   triphasic            +-----------+--------+-----+--------+---------+-----------+ PTA Distal                               collaterals +-----------+--------+-----+--------+---------+-----------+ PERO Distal                              collaterals +-----------+--------+-----+--------+---------+-----------+  +-----------+--------+-----+--------+--------------------+-----------------+ LEFT       PSV cm/sRatioStenosisWaveform            Comments          +-----------+--------+-----+--------+--------------------+-----------------+ EIA Distal 98                   multiphasic                           +-----------+--------+-----+--------+--------------------+-----------------+ CFA Prox   45                   multiphasic                           +-----------+--------+-----+--------+--------------------+-----------------+ CFA Distal 58                   multiphasic                           +-----------+--------+-----+--------+--------------------+-----------------+ DFA        56                   multiphasic                           +-----------+--------+-----+--------+--------------------+-----------------+  SFA Prox   18                   biphasic to dampened                  +-----------+--------+-----+--------+--------------------+-----------------+ SFA Mid    21                   monophasic                            +-----------+--------+-----+--------+--------------------+-----------------+ SFA Distal 12                   monophasic                            +-----------+--------+-----+--------+--------------------+-----------------+ POP Prox   16                   monophasic                            +-----------+--------+-----+--------+--------------------+-----------------+ POP Distal 10                    monophasic                            +-----------+--------+-----+--------+--------------------+-----------------+ TP Trunk   19                   monophasic                            +-----------+--------+-----+--------+--------------------+-----------------+ ATA Prox   18                   monophasic          collaterals noted +-----------+--------+-----+--------+--------------------+-----------------+ ATA Mid    11                   monophasic          collaterals noted +-----------+--------+-----+--------+--------------------+-----------------+ ATA Distal 13                   monophasic                            +-----------+--------+-----+--------+--------------------+-----------------+ PTA Prox   19                   monophasic                            +-----------+--------+-----+--------+--------------------+-----------------+ PTA Mid    30                   monophasic                            +-----------+--------+-----+--------+--------------------+-----------------+ PTA Distal 49                   monophasic                            +-----------+--------+-----+--------+--------------------+-----------------+ PERO Prox  8  monophasic                            +-----------+--------+-----+--------+--------------------+-----------------+ PERO Mid   13                   monophasic                            +-----------+--------+-----+--------+--------------------+-----------------+ PERO Distal14                   monophasic                            +-----------+--------+-----+--------+--------------------+-----------------+  Summary: Right: Triphasic waveforms noted in the common femoral, femoral, and popliteal arteries. Left: Multiphasic waveforms noted in the distal external iliac, common femoral, and profunda femoral arteries. Waveforms are biphasic in the proximal superficial artery, but  are noted to change to dampened biphasic/monophasic in the proximal-mid segments. There is significant plaque noted in the distal superficial femoral and proximal popliteal arteries. Monophasic flow noted throughout the posterior tibial, anterior tibial, and peroneal arteries. Collaterals noted throughout the calf.  See table(s) above for measurements and observations.  Electronically signed by Fonda Rim on 10/09/2024 at 1:17:55 PM.    Final    VAS US  LOWER EXTREMITY VENOUS (DVT) (7a-5p) Result Date: 10/09/2024  Lower Venous DVT Study Patient Name:  SOFIA JAQUITH  Date of Exam:   10/09/2024 Medical Rec #: [id number]      Accession #:    [id number] Date of Birth: May 23, 1956      Patient Gender: M Patient Age:   12 years Exam Location:  [facility] Procedure:      VAS US  LOWER EXTREMITY VENOUS (DVT) Referring Phys: [facility] PFEIFFER --------------------------------------------------------------------------------  Indications: Thigh and hip pain X 3 days.  Risk Factors: Remote history of DVT and PE. Anticoagulation: Coumadin . Comparison Study: Prior negative left LEV done 08/04/19. Prior negative bilateral                   LEV done 05/12/18 Performing Technologist: Alberta Lis RVS  Examination Guidelines: A complete evaluation includes B-mode imaging, spectral Doppler, color Doppler, and power Doppler as needed of all accessible portions of each vessel. Bilateral testing is considered an integral part of a complete examination. Limited examinations for reoccurring indications may be performed as noted. The reflux portion of the exam is performed with the patient in reverse Trendelenburg.  +---------+---------------+---------+-----------+----------+------------------+ RIGHT    CompressibilityPhasicitySpontaneityPropertiesThrombus Aging     +---------+---------------+---------+-----------+----------+------------------+ CFV      Full           Yes      Yes                  Rouleaux flow       +---------+---------------+---------+-----------+----------+------------------+ SFJ      Full                                                            +---------+---------------+---------+-----------+----------+------------------+ FV Prox  Partial  Age Indeterminate  +---------+---------------+---------+-----------+----------+------------------+ FV DistalPartial                                      ? acute on chronic +---------+---------------+---------+-----------+----------+------------------+ PFV      Partial        Yes      No                   Acute              +---------+---------------+---------+-----------+----------+------------------+ POP      Partial        No       No                   Acute              +---------+---------------+---------+-----------+----------+------------------+ PTV      Partial                                      Age Indeterminate  +---------+---------------+---------+-----------+----------+------------------+ PERO     Partial                                      Age Indeterminate  +---------+---------------+---------+-----------+----------+------------------+   +---------+---------------+---------+-----------+----------+-------------------+ LEFT     CompressibilityPhasicitySpontaneityPropertiesThrombus Aging      +---------+---------------+---------+-----------+----------+-------------------+ CFV      Full           Yes      Yes                                      +---------+---------------+---------+-----------+----------+-------------------+ SFJ      Full                                                             +---------+---------------+---------+-----------+----------+-------------------+ FV Prox  Partial        Yes      No                   Acute               +---------+---------------+---------+-----------+----------+-------------------+ FV Mid   Partial                                       Acute               +---------+---------------+---------+-----------+----------+-------------------+ FV DistalPartial        No       No                   Acute               +---------+---------------+---------+-----------+----------+-------------------+ PFV      Partial        Yes      No                   Acute               +---------+---------------+---------+-----------+----------+-------------------+  POP      Partial        Yes      No                   Acute on chronic    +---------+---------------+---------+-----------+----------+-------------------+ PTV      Partial        No       No                   Acute               +---------+---------------+---------+-----------+----------+-------------------+ PERO     Partial        No       No                   Acute               +---------+---------------+---------+-----------+----------+-------------------+ Gastroc  Full                                                             +---------+---------------+---------+-----------+----------+-------------------+ EIV                     Yes      No                   patent by color and                                                       Doppler             +---------+---------------+---------+-----------+----------+-------------------+     Summary: RIGHT: - Findings consistent with acute deep vein thrombosis involving the right proximal profunda vein, and right popliteal vein.  - Findings consistent with age indeterminate deep vein thrombosis involving the right femoral vein, right posterior tibial veins, and right peroneal veins. Possible acute on chronic DVT noted distal femoral vein.  - No cystic structure found in the popliteal fossa.  LEFT: - Findings consistent with acute deep vein thrombosis involving the left femoral vein, left proximal profunda vein, left popliteal vein, left posterior tibial veins, and  left peroneal veins.  - No cystic structure found in the popliteal fossa.  *See table(s) above for measurements and observations. Electronically signed by Fonda Rim on 10/09/2024 at 1:15:33 PM.    Final    DG Femur Min 2 Views Left Result Date: 10/08/2024 EXAM: 2 VIEW(S) XRAY OF THE LEFT FEMUR 10/08/2024 10:32:00 PM COMPARISON: None available. 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 No malalignment. SOFT TISSUES: Vascular calcifications. IMPRESSION: 1. No acute fracture. 2. 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 Electronically signed by: Greig Pique MD MD 10/08/2024 10:39 PM EST RP Workstation: [id number]   DG Hip Unilat W or Wo Pelvis 2-3 Views Left Result Date: 10/08/2024 EXAM: 2 OR MORE VIEW(S) XRAY OF THE UNILATERAL HIP 10/08/2024 10:32:00 PM COMPARISON: None available. CLINICAL HISTORY: Pain FINDINGS: BONES AND JOINTS: No acute fracture. No malalignment. Subtle sclerosis and lucency along the left femoral  head, possibly representing AVN. Mild joint space loss of the left hip. Mild degenerative changes of the sacroiliac joints. SOFT TISSUES: Vascular calcifications noted. IMPRESSION: 1. 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 2. Mild left hip osteoarthrosis. Electronically signed by: Greig Pique MD MD 10/08/2024 10:38 PM EST RP Workstation: [id number]   CUP PACEART REMOTE DEVICE CHECK Result Date: 09/21/2024 ICD Scheduled remote reviewed. Normal device function.  Presenting rhythm: VS.  HF diagnostics have been abnormal in this monitoring period. Next remote transmission per protocol. - CS, CVRS   ASSESSMENT & PLAN:   69 yo male with bilateral lower extremity DVT while on therapeutic anticoagulation with warfarin, INR was 2.8 at the time of admission, hematology consulted for additional recommendations.  He was first seen by Dr.  Melodye in hematology oncology and later by Dr. Webb.  I do not see any evidence of hypercoagulable workup done.  There were some discussions about it but I do not see that this has actually been performed.  1 possible risk factor per Dr. Callie is his severe CHF with an ejection fraction of 25%.  At this time I would recommend continuing anticoagulation with heparin  or Lovenox  therapeutic dose.  Outpatient, we can consider further workup including hypercoagulable workup and additional imaging as needed. He had an endoscopy and colonoscopy which didn't quite explain the reason for his IDA, he may also benefit from outpt capsule endoscopy.  Vascular has been involved, no role for intervention.  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  I will discuss this further with Dr. Onesimo.  Time spent: 55 min  .*Total Encounter Time as defined by the Centers for Medicare and Medicaid Services includes, in addition to the face-to-face time of a patient visit (documented in the note above) non-face-to-face time: obtaining and reviewing outside history, ordering and reviewing medications, tests or procedures, care coordination (communications with other health care professionals or caregivers) and documentation in the medical record.   I,Mitra Faeizi,acting as a neurosurgeon for No name on file.,have documented all relevant documentation on the behalf of No name on file,as directed by  No name on file while in the presence of No name on file.  .I have reviewed the above documentation for accuracy and completeness, and I agree with the above. SABRA    "

## 2024-10-11 ENCOUNTER — Other Ambulatory Visit (HOSPITAL_COMMUNITY): Payer: Self-pay

## 2024-10-11 ENCOUNTER — Inpatient Hospital Stay (HOSPITAL_COMMUNITY)

## 2024-10-11 ENCOUNTER — Telehealth (HOSPITAL_COMMUNITY): Payer: Self-pay

## 2024-10-11 DIAGNOSIS — I824Y3 Acute embolism and thrombosis of unspecified deep veins of proximal lower extremity, bilateral: Secondary | ICD-10-CM | POA: Diagnosis not present

## 2024-10-11 DIAGNOSIS — E854 Organ-limited amyloidosis: Secondary | ICD-10-CM | POA: Diagnosis not present

## 2024-10-11 DIAGNOSIS — I43 Cardiomyopathy in diseases classified elsewhere: Secondary | ICD-10-CM | POA: Diagnosis not present

## 2024-10-11 LAB — COMPREHENSIVE METABOLIC PANEL WITH GFR
ALT: 11 U/L (ref 0–44)
AST: 18 U/L (ref 15–41)
Albumin: 3.3 g/dL — ABNORMAL LOW (ref 3.5–5.0)
Alkaline Phosphatase: 62 U/L (ref 38–126)
Anion gap: 12 (ref 5–15)
BUN: 20 mg/dL (ref 8–23)
CO2: 20 mmol/L — ABNORMAL LOW (ref 22–32)
Calcium: 9 mg/dL (ref 8.9–10.3)
Chloride: 103 mmol/L (ref 98–111)
Creatinine, Ser: 0.87 mg/dL (ref 0.61–1.24)
GFR, Estimated: >60 mL/min
Glucose, Bld: 105 mg/dL — ABNORMAL HIGH (ref 70–99)
Potassium: 3.3 mmol/L — ABNORMAL LOW (ref 3.5–5.1)
Sodium: 135 mmol/L (ref 135–145)
Total Bilirubin: 0.6 mg/dL (ref 0.0–1.2)
Total Protein: 6.5 g/dL (ref 6.5–8.1)

## 2024-10-11 LAB — CBC WITH DIFFERENTIAL/PLATELET
Abs Immature Granulocytes: 0.03 K/uL (ref 0.00–0.07)
Basophils Absolute: 0 K/uL (ref 0.0–0.1)
Basophils Relative: 1 %
Eosinophils Absolute: 0.1 K/uL (ref 0.0–0.5)
Eosinophils Relative: 1 %
HCT: 40.4 % (ref 39.0–52.0)
Hemoglobin: 13.4 g/dL (ref 13.0–17.0)
Immature Granulocytes: 1 %
Lymphocytes Relative: 32 %
Lymphs Abs: 1.8 K/uL (ref 0.7–4.0)
MCH: 28.5 pg (ref 26.0–34.0)
MCHC: 33.2 g/dL (ref 30.0–36.0)
MCV: 86 fL (ref 80.0–100.0)
Monocytes Absolute: 0.6 K/uL (ref 0.1–1.0)
Monocytes Relative: 10 %
Neutro Abs: 3.2 K/uL (ref 1.7–7.7)
Neutrophils Relative %: 55 %
Platelets: 185 K/uL (ref 150–400)
RBC: 4.7 MIL/uL (ref 4.22–5.81)
RDW: 14.4 % (ref 11.5–15.5)
WBC: 5.6 K/uL (ref 4.0–10.5)
nRBC: 0 % (ref 0.0–0.2)

## 2024-10-11 LAB — PROTIME-INR
INR: 1.5 — ABNORMAL HIGH (ref 0.8–1.2)
Prothrombin Time: 18.6 s — ABNORMAL HIGH (ref 11.4–15.2)

## 2024-10-11 LAB — MAGNESIUM: Magnesium: 2.3 mg/dL (ref 1.7–2.4)

## 2024-10-11 LAB — HEPARIN LEVEL (UNFRACTIONATED): Heparin Unfractionated: 0.39 [IU]/mL (ref 0.30–0.70)

## 2024-10-11 LAB — PHOSPHORUS: Phosphorus: 2.3 mg/dL — ABNORMAL LOW (ref 2.5–4.6)

## 2024-10-11 MED ORDER — POTASSIUM CHLORIDE CRYS ER 20 MEQ PO TBCR
40.0000 meq | EXTENDED_RELEASE_TABLET | Freq: Once | ORAL | Status: AC
  Administered 2024-10-11: 40 meq via ORAL
  Filled 2024-10-11: qty 2

## 2024-10-11 MED ORDER — PREDNISONE 20 MG PO TABS
50.0000 mg | ORAL_TABLET | Freq: Four times a day (QID) | ORAL | Status: AC
  Administered 2024-10-11 (×3): 50 mg via ORAL
  Filled 2024-10-11 (×3): qty 1

## 2024-10-11 MED ORDER — DIPHENHYDRAMINE HCL 50 MG/ML IJ SOLN: 50.0000 mg | Freq: Once | INTRAMUSCULAR | Status: DC

## 2024-10-11 MED ORDER — IOHEXOL 350 MG/ML SOLN
75.0000 mL | Freq: Once | INTRAVENOUS | Status: AC | PRN
  Administered 2024-10-11: 75 mL via INTRAVENOUS

## 2024-10-11 MED ORDER — K PHOS MONO-SOD PHOS DI & MONO 155-852-130 MG PO TABS
500.0000 mg | ORAL_TABLET | Freq: Two times a day (BID) | ORAL | Status: AC
  Administered 2024-10-11 (×2): 500 mg via ORAL
  Filled 2024-10-11 (×3): qty 2

## 2024-10-11 MED ORDER — DIPHENHYDRAMINE HCL 25 MG PO CAPS
50.0000 mg | ORAL_CAPSULE | Freq: Once | ORAL | Status: AC
  Filled 2024-10-11: qty 2

## 2024-10-11 MED ORDER — DIPHENHYDRAMINE HCL 25 MG PO CAPS: 50.0000 mg | ORAL_CAPSULE | Freq: Once | ORAL | Status: DC

## 2024-10-11 MED ORDER — DIPHENHYDRAMINE HCL 50 MG/ML IJ SOLN
50.0000 mg | Freq: Once | INTRAMUSCULAR | Status: AC
  Filled 2024-10-11: qty 1

## 2024-10-11 NOTE — Evaluation (Signed)
 Occupational Therapy Evaluation Patient Details Name: Jonathan Dickson MRN: [id number] DOB: 22-Oct-1955 Today's Date: 10/11/2024   History of Present Illness   Pt is a 69 y.o. male admitted 1/9 with BLE DVTs and L hip AVN. 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 Pt currently requires up to Max A for LB ADL tasks and up to CGA for functional mobility with RW. Pt is primarily limited by pain, decreased activity tolerance, unsteadiness on feet, and generalized weakness. OT to continue to follow Pt acutely to facilitate progress towards goal. Will assess follow-up needs as Pt progresses and surgical plan is confirmed following L hip MRI.      If plan is discharge home, recommend the following:   A little help with walking and/or transfers;A lot of help with bathing/dressing/bathroom;Assistance with cooking/housework;Assist for transportation;Help with stairs or ramp for entrance     Functional Status Assessment   Patient has had a recent decline in their functional status and demonstrates the ability to make significant improvements in function in a reasonable and predictable amount of time.     Equipment Recommendations   Other (comment) (to be determined)     Recommendations for Other Services         Precautions/Restrictions   Precautions Precautions: Fall Recall of Precautions/Restrictions: Intact Restrictions Weight Bearing Restrictions Per Provider Order: Yes LLE Weight Bearing Per Provider Order: Weight bearing as tolerated     Mobility Bed Mobility Overal bed mobility: Modified Independent             General bed mobility comments: HOB elevated, increased time to come to EOB but no physical assistance needed    Transfers Overall transfer level: Needs assistance Equipment  used: Rolling walker (2 wheels) Transfers: Sit to/from Stand Sit to Stand: Contact guard assist           General transfer comment: CGA and verbal cues for proper hand placement on RW. Pt able to ambulate ~32ft with CGA. Slight buckling noted in LLE with ambulation but Pt was able to stabilize himself with RW.      Balance Overall balance assessment: Needs assistance Sitting-balance support: No upper extremity supported, Feet supported Sitting balance-Leahy Scale: Fair     Standing balance support: Bilateral upper extremity supported, During functional activity, Reliant on assistive device for balance Standing balance-Leahy Scale: Poor Standing balance comment: Dependent on RW                           ADL either performed or assessed with clinical judgement   ADL Overall ADL's : Needs assistance/impaired Eating/Feeding: Set up;Sitting   Grooming: Set up;Sitting   Upper Body Bathing: Supervision/ safety;Sitting   Lower Body Bathing: Maximal assistance   Upper Body Dressing : Set up;Sitting   Lower Body Dressing: Maximal assistance;Sitting/lateral leans   Toilet Transfer: Contact guard assist;BSC/3in1;Rolling walker (2 wheels);Ambulation   Toileting- Clothing Manipulation and Hygiene: Moderate assistance               Vision Baseline Vision/History: 1 Wears glasses Patient Visual Report: No change from baseline Vision Assessment?: No apparent visual deficits     Perception         Praxis         Pertinent Vitals/Pain Pain Assessment Pain Assessment: 0-10 Pain Score: 4  Pain Location: LLE Pain Descriptors / Indicators: Burning Pain Intervention(s): Limited activity within patient's tolerance, Monitored during session, Repositioned     Extremity/Trunk Assessment Upper Extremity Assessment Upper Extremity Assessment: Generalized weakness   Lower Extremity Assessment Lower Extremity Assessment: Defer to PT evaluation   Cervical / Trunk  Assessment Cervical / Trunk Assessment: Normal   Communication Communication Communication: No apparent difficulties   Cognition Arousal: Alert Behavior During Therapy: WFL for tasks assessed/performed Cognition: No apparent impairments                               Following commands: Intact       Cueing  General Comments   Cueing Techniques: Verbal cues  VSS on RA   Exercises     Shoulder Instructions      Home Living Family/patient expects to be discharged to:: Private residence Living Arrangements: Alone Available Help at Discharge: Family;Available PRN/intermittently Type of Home: Apartment Home Access: Elevator     Home Layout: One level     Bathroom Shower/Tub: Chief Strategy Officer: Standard     Home Equipment: Cane - single point          Prior Functioning/Environment Prior Level of Function : Independent/Modified Independent             Mobility Comments: Did not use the cane until day admit to hospital ADLs Comments: Indepenent    OT Problem List: Decreased strength;Decreased activity tolerance;Impaired balance (sitting and/or standing);Decreased knowledge of use of DME or AE;Decreased knowledge of precautions;Pain   OT Treatment/Interventions: Self-care/ADL training;Therapeutic exercise;Energy conservation;DME and/or AE instruction;Therapeutic activities;Patient/family education;Balance training      OT Goals(Current goals can be found in the care plan section)   Acute Rehab OT Goals Patient Stated Goal: to get better OT Goal Formulation: With patient Time For Goal Achievement: 10/25/24 Potential to Achieve Goals: Good ADL Goals Pt Will Perform Lower Body Bathing: with supervision;with adaptive equipment;sitting/lateral leans Pt Will Perform Lower Body Dressing: with min assist;sitting/lateral leans;with adaptive equipment Pt Will Transfer to Toilet: with supervision;ambulating;bedside commode Pt Will  Perform Tub/Shower Transfer: Tub transfer;tub bench Pt/caregiver will Perform Home Exercise Program: Increased strength;Both right and left upper extremity;With written HEP provided   OT Frequency:  Min 2X/week    Co-evaluation              AM-PAC OT 6 Clicks Daily Activity     Outcome Measure Help from another person eating meals?: A Little Help from another person taking care of personal grooming?: A Little Help from another person toileting, which includes using toliet, bedpan, or urinal?: A Lot Help from another person bathing (including washing, rinsing, drying)?: A Lot Help from another person to put on and taking off regular upper body clothing?: A Little Help from another person to put on and taking off regular lower body clothing?: A Lot 6 Click Score: 15   End of Session Equipment Utilized During Treatment: Gait belt;Rolling walker (2 wheels) Nurse Communication: Mobility status  Activity Tolerance: Patient tolerated treatment well Patient left: in bed;with call bell/phone within reach;with bed alarm set  OT Visit Diagnosis: Unsteadiness on feet (R26.81);Muscle weakness (generalized) (M62.81);Pain Pain - Right/Left: Left Pain - part of body: Hip                Time: 9148-9096 OT Time Calculation (min): 12 min Charges:  OT General Charges $OT Visit: 1 Visit OT Evaluation $OT Eval Low Complexity:  1 Low  Maurilio CROME, OTR/L.  [facility] Acute Rehabilitation  Office: [phone]   Maurilio PARAS Doneen Ollinger 10/11/2024, 11:22 AM

## 2024-10-11 NOTE — Progress Notes (Signed)
 Back on the floor after CT at 2219. No signs of acute distress. Call bell within reach. No needs at this time.

## 2024-10-11 NOTE — Plan of Care (Signed)

## 2024-10-11 NOTE — Progress Notes (Signed)
 " PROGRESS NOTE    Jonathan Dickson  FMW:[id number] DOB: [birth year] DOA: 10/08/2024 PCP: Theotis Haze ORN, NP   Brief Narrative:  Jonathan Dickson is a 69 y.o. 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 Vascular Surgery, Orthopedic Surgery, and Medical Oncology consulted for further evaluation and workup.  Assessment and Plan:   BLE DVTs: Unclear etiology/progression despite therapeutic INR, but can not exclude malignancy as source.Hematology consulted for [facility] per EDP; apprec eval/recs and recommending continuing Heparin  gtt and considering Full Dose Enoxaparin  @ D/C -Vascular surgery consulted; apprec eval/recs and they had no plans for percutaneous mechanical thrombectomy at this time as it does not involve the common femoral artery and no significant swelling in the left leg -IV heparin  gtt for now and will need to run the cost of Eliquis . PT-INR went from 30.9-2.8 -> 25.1-2.2 -F/u PSA and was 1.42 - Has seen Dr. Melodye in hematology/Oncology and then Dr. Onesimo - Dr. Onesimo feels that his severe CHF with an EF of 25% could be a risk factor - Will need outpatient hypercoagulable workup. - Oncology evaluating for malignancy and ordering a CT Abdomen pelvis w/ contrast and getting    Avascular Necrosis of L Femoral Head -PT/OT consulted; apprec eval/recs pending -Orthopedic surgery Dr. Ernie consulted; apprec eval/recs and they are recommending obtaining MRI of the hip which is still pending to evaluate if this is acute versus chronic AVN.;  -MRI done and showed Avascular necrosis of the superior and anterior left femoral head involving the majority of the weight-bearing articular surface with pronounced surrounding marrow edema extending  through the femoral neck. No appreciable evidence of subchondral collapse. Small left hip joint effusion. Periarticular soft tissue/intramuscular edema involving the left adductor and adjacent anterior compartment musculature. Mild marrow edema and subchondral cystic change of the left superior acetabulum. Large field-of-view coronal images demonstrate less pronounced avascular necrosis of the right superolateral femoral head without significant surrounding marrow edema.Mild joint space narrowing of the bilateral hips.  Low-grade partial-thickness undersurface tear of the left hamstring conjoint tendon origin. -Multimodal pain control w/ tylenol , gabapentin , robaxin , and oxycodone  prn   HFrEF: C/w Coreg  12.5mg  BID, atorvastatin  80mg  daily, Enresto 49-51mg  BID, lasix  20mg  daily (pta 40mg  prn) and Farxiga  10mg  daily.  Continue to monitor for signs and symptoms of volume overload. Holding Spironolactone  12.5 mg p.o. daily.  Intake/Output Summary (Last 24 hours) at 10/11/2024 1955 Last data filed at 10/11/2024 1355 Gross per 24 hour  Intake --  Output 700 ml  Net -700 ml    Hypokalemia: K+ is now 3.3. Replete w/ po KCL 40 mEQ x1 and po K Phos  Neutral 500 mg po BID x2. CTM and Replete as Necessary. Repeat CMP in the AM  Hypophosphatemia: Phos Level is now 2.3. Replete w/ po K Phos  Neutral 500 mg po BID x2. CTM & Trend & Repeat CMP in the AM  HLD: C/w Atorvastatin  80 mg po Daily   Metabolic Acidosis: Mild and Improved. CO2 is now 22, AG is 13, and Chloride Lvl is now 101. CTM and Trend and repeat CMP in the AM   Hx of IDA: Hgb/Hct Trend:  Recent Labs  Lab 10/09/24 1107 10/10/24 0536 10/11/24 0522  HGB 14.9 14.3 13.4  HCT  45.5 43.0 40.4  MCV 88.7 86.9 86.0  -Continue monitor for signs and symptoms of bleeding as patient is anticoag with heparin  drip. No overt bleeding noted.  Repeat CBC in a.m. -Has had outpatient endoscopy and colonoscopy with did not explain the reason for his IDA but the team  is recommending possible outpatient capsule endoscopy  Amyloid Cardiomyopathy: C/w Tafamidis  61 mg po Daily   Diabetes Mellitus 2: Continue Farxiga  10 mg p.o. daily.  Holding metformin  500 mg p.o. CTM and Trend CBG per Protocol. No results for input(s): GLUCAP in the last 720 hours.  Hypoalbuminemia: Patient's Albumin Lvl is now 3.3. CTM & Trend & repeat CMP in the AM   DVT prophylaxis: Anticoagulated w/ Heparin  gtt    Code Status: Full Code Family Communication: No family present @ bedside   Disposition Plan:  Level of care: Med-Surg Status is: Inpatient Remains inpatient appropriate because: Needs further clinical improvement and clearance by the specialists   Consultants:  Medical Oncology Orthopedic Surgery  Vascular Surgery  Procedures:  As delineated as above  Antimicrobials:  Anti-infectives (From admission, onward)    None       Subjective: Seen and examined at bedside and is resting and had an okay night.  Total but nauseous and vomited last night.  No vomiting this morning.  Still complaining of pain in his legs especially the left one.  No other concerns or complaints at this time.  Objective: Vitals:   10/10/24 2250 10/11/24 0437 10/11/24 0755 10/11/24 1657  BP: 106/70 116/79 122/81 (!) 144/86  Pulse: 86 79 82 75  Resp: 16 19    Temp: 98.1 F (36.7 C) 98.7 F (37.1 C) 98.8 F (37.1 C) 97.8 F (36.6 C)  TempSrc:  Oral Oral Oral  SpO2: 94% 92% 95% 98%  Weight:      Height:        Intake/Output Summary (Last 24 hours) at 10/11/2024 1955 Last data filed at 10/11/2024 1355 Gross per 24 hour  Intake --  Output 700 ml  Net -700 ml   Filed Weights   10/09/24 1500 10/09/24 1738  Weight: 70 kg 66 kg   Examination: Physical Exam:  Constitutional: Chronically ill-appearing African-American male in no acute distress Respiratory: Diminished to auscultation bilaterally, no wheezing, rales, rhonchi or crackles. Normal respiratory effort and patient is  not tachypenic. No accessory muscle use.  Unlabored breathing Cardiovascular: RRR, no murmurs / rubs / gallops. S1 and S2 auscultated. No extremity edema.  Abdomen: Soft, non-tender, non-distended. Bowel sounds positive.  GU: Deferred. Musculoskeletal: No clubbing / cyanosis of digits/nails. No joint deformity upper and lower extremities. Skin: No rashes, lesions, ulcers on a limited skin evaluation. No induration; Warm and dry.  Neurologic: CN 2-12 grossly intact with no focal deficits. Romberg sign and cerebellar reflexes not assessed.  Psychiatric: Normal judgment and insight. Alert and oriented x 3. Normal mood and appropriate affect.   Data Reviewed: I have personally reviewed following labs and imaging studies  CBC: Recent Labs  Lab 10/09/24 1107 10/10/24 0536 10/11/24 0522  WBC 7.1 8.0 5.6  NEUTROABS  --   --  3.2  HGB 14.9 14.3 13.4  HCT 45.5 43.0 40.4  MCV 88.7 86.9 86.0  PLT 187 190 185   Basic Metabolic Panel: Recent Labs  Lab 10/09/24 1107 10/10/24 0536 10/11/24 0522  NA 136 135 135  K 4.2 3.8 3.3*  CL 103 101 103  CO2 20* 22 20*  GLUCOSE 96 108* 105*  BUN  16 21 20   CREATININE 0.99 0.93 0.87  CALCIUM  9.1 9.1 9.0  MG  --   --  2.3  PHOS  --   --  2.3*   GFR: Estimated Creatinine Clearance: 72.3 mL/min (by C-G formula based on SCr of 0.87 mg/dL). Liver Function Tests: Recent Labs  Lab 10/11/24 0522  AST 18  ALT 11  ALKPHOS 62  BILITOT 0.6  PROT 6.5  ALBUMIN 3.3*   No results for input(s): LIPASE, AMYLASE in the last 168 hours. No results for input(s): AMMONIA in the last 168 hours. Coagulation Profile: Recent Labs  Lab 10/09/24 1107 10/10/24 0536 10/11/24 0522  INR 2.8* 2.2* 1.5*   Cardiac Enzymes: No results for input(s): CKTOTAL, CKMB, CKMBINDEX, TROPONINI in the last 168 hours. BNP (last 3 results) No results for input(s): PROBNP in the last 8760 hours. HbA1C: No results for input(s): HGBA1C in the last 72  hours. CBG: No results for input(s): GLUCAP in the last 168 hours. Lipid Profile: No results for input(s): CHOL, HDL, LDLCALC, TRIG, CHOLHDL, LDLDIRECT in the last 72 hours. Thyroid Function Tests: No results for input(s): TSH, T4TOTAL, FREET4, T3FREE, THYROIDAB in the last 72 hours. Anemia Panel: No results for input(s): VITAMINB12, FOLATE, FERRITIN, TIBC, IRON , RETICCTPCT in the last 72 hours. Sepsis Labs: No results for input(s): PROCALCITON, LATICACIDVEN in the last 168 hours.  No results found for this or any previous visit (from the past 240 hours).   Radiology Studies: MR HIP LEFT WO CONTRAST Result Date: 10/11/2024 CLINICAL DATA:  Pain.  Evaluation of avascular necrosis. EXAM: MR OF THE LEFT HIP WITHOUT CONTRAST TECHNIQUE: Multiplanar, multisequence MR imaging was performed. No intravenous contrast was administered. COMPARISON:  Radiographs dated 10/08/2024. 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 No appreciable evidence of subchondral collapse. Underlying mild marrow edema and subchondral cystic change of the left superior acetabulum. Small left hip joint effusion. Periarticular soft tissue/intramuscular edema involving the left adductor and adjacent anterior compartment musculature. Large field-of-view coronal images demonstrate less pronounced avascular necrosis of the right superolateral femoral head without significant surrounding marrow edema. Mild joint space narrowing of the bilateral hips. Sacroiliac joints and pubic symphysis are within normal limits. Degenerative changes of the visualized lower lumbar spine. Soft tissue and Muscle: Muscle bulk appears age-appropriate and relatively symmetric bilaterally.There is a partial-thickness undersurface tear of the  left hamstring conjoint tendon origin.Gluteal cuff tendon insertions are intact.Visualized intrapelvic contents are grossly unremarkable. No enlarged lymph nodes identified in the field of view. IMPRESSION: 1. 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 No appreciable evidence of subchondral collapse. 2. Small left hip joint effusion. Periarticular soft tissue/intramuscular edema involving the left adductor and adjacent anterior compartment musculature. 3. Mild marrow edema and subchondral cystic change of the left superior acetabulum. 4. Large field-of-view coronal images demonstrate less pronounced avascular necrosis of the right superolateral femoral head without significant surrounding marrow edema. 5. Mild joint space narrowing of the bilateral hips. 6. Low-grade partial-thickness undersurface tear of the left hamstring conjoint tendon origin. Electronically Signed   By: Harrietta Sherry M.D.   On: 10/11/2024 18:30   Scheduled Meds:  acetaminophen   1,000 mg Oral TID   atorvastatin   80 mg Oral Daily   carvedilol   12.5 mg  Oral BID WC   dapagliflozin  propanediol  10 mg Oral Daily   diphenhydrAMINE   50 mg Oral Once   Or   diphenhydrAMINE   50 mg Intravenous Once   furosemide   20 mg Oral Daily   gabapentin   300 mg Oral TID   methocarbamol   500 mg Oral TID   phosphorus  500 mg Oral BID   predniSONE   50 mg Oral Q6H   sacubitril -valsartan   1 tablet Oral BID   Tafamidis   1 capsule Oral Daily   Continuous Infusions:  heparin  1,050 Units/hr (10/11/24 1407)    LOS: 2 days   Alejandro Marker, DO [facility] Available via Epic secure chat 7am-7pm After these hours, please refer to coverage provider listed on [email or web address] 10/11/2024, 7:55 PM  "

## 2024-10-11 NOTE — Progress Notes (Signed)
 PHARMACY - ANTICOAGULATION CONSULT NOTE  Pharmacy Consult for heparin  Indication: DVT  Allergies[1]  Patient Measurements: Height: 5' 6 (167.6 cm) Weight: 66 kg (145 lb 8.1 oz) IBW/kg (Calculated) : 63.8 HEPARIN  DW (KG): 66  Vital Signs: Temp: 98.7 F (37.1 C) (01/12 0437) Temp Source: Oral (01/12 0437) BP: 116/79 (01/12 0437) Pulse Rate: 79 (01/12 0437)  Labs: Recent Labs    10/09/24 1107 10/09/24 2137 10/10/24 0536 10/11/24 0522  HGB 14.9  --  14.3 13.4  HCT 45.5  --  43.0 40.4  PLT 187  --  190 185  LABPROT 30.9*  --  25.1* 18.6*  INR 2.8*  --  2.2* 1.5*  HEPARINUNFRC  --  0.42 0.36 0.39  CREATININE 0.99  --  0.93 0.87    Estimated Creatinine Clearance: 72.3 mL/min (by C-G formula based on SCr of 0.87 mg/dL).  Assessment: 31 YOM presenting with leg pain found to have acute on chronic DVT. On warfarin PTA for hx of DVT with INR 2.8 on admit (goal 2-3). Discussed with MD, hematology recommending heparin  for now.  Heparin  level 0.39, therapeutic but trending down. Will give slight dose increase to keep patient in therapeutic range. CBC stable.  INR 2.2 No issues with infusion or bleeding noted.   Goal of Therapy:  Heparin  level 0.3-0.7 units/ml Monitor platelets by anticoagulation protocol: Yes   Plan:  Heparin  at 1050 units / hr Daily heparin  level and INR F/u hematology recs and long term [facility] plan  Thank you for allowing pharmacy to participate in this patient's care. Olam Monte, PharmD  10/11/2024 7:53 AM            [1]  Allergies Allergen Reactions   Peanut-Containing Drug Products Swelling   Penicillins Other (See Comments)    CONVULSIONS    Decadron  [Dexamethasone ] Itching   Ivp Dye [Iodinated Contrast Media]

## 2024-10-11 NOTE — Progress Notes (Signed)
 Transition of Care [facility]) - Inpatient Brief Assessment  Patient Details  Name: Jonathan Dickson MRN: [id number] Date of Birth: [birth year]  Transition of [facility]) CM/SW Contact:    Nena LITTIE Coffee, RN Phone Number: 10/11/2024, 12:26 PM  Clinical Narrative: 69 y/o male from home alone, independent, drives himself. Admitted for BLE DVTs. Plans to return home at dc c/sister providing transportation.   Current DME: cane  Inpatient Case Management (ICM)/Transition of Care Department (TOC) has reviewed patient and will continue to monitor patient advancement.  If new patient needs arise, please place a TOC consult.  Transition of Care Asessment: Insurance and Status: (P) Insurance coverage has been reviewed Patient has primary care physician: (P) Yes Home environment has been reviewed: (P) From home alone Prior level of function:: (P) Independent Prior/Current Home Services: (P) No current home services Social Drivers of Health Review: (P) SDOH reviewed no interventions necessary Readmission risk has been reviewed: (P) Yes Transition of care needs: (P) no transition of care needs at this time

## 2024-10-11 NOTE — Telephone Encounter (Signed)
 Pharmacy Patient Advocate Encounter  Insurance verification completed.    The patient is insured through [facility]. Patient has Medicare and is not eligible for a copay card, but may be able to apply for patient assistance or Medicare RX Payment Plan (Patient Must reach out to their plan, if eligible for payment plan), if available.    Ran test claim for Lovenox  100mg /ml and the current 30 day co-pay is $1.60.   This test claim was processed through  [facility]- copay amounts may vary at other pharmacies due to pharmacy/plan contracts, or as the patient moves through the different stages of their insurance plan.

## 2024-10-11 NOTE — Progress Notes (Signed)
Off the floor for CT 

## 2024-10-12 ENCOUNTER — Other Ambulatory Visit

## 2024-10-12 ENCOUNTER — Other Ambulatory Visit (HOSPITAL_COMMUNITY): Payer: Self-pay

## 2024-10-12 ENCOUNTER — Ambulatory Visit: Admitting: Hematology

## 2024-10-12 DIAGNOSIS — I824Y3 Acute embolism and thrombosis of unspecified deep veins of proximal lower extremity, bilateral: Secondary | ICD-10-CM | POA: Diagnosis not present

## 2024-10-12 DIAGNOSIS — M8700 Idiopathic aseptic necrosis of unspecified bone: Secondary | ICD-10-CM

## 2024-10-12 LAB — CBC WITH DIFFERENTIAL/PLATELET
Abs Immature Granulocytes: 0.05 K/uL (ref 0.00–0.07)
Basophils Absolute: 0 K/uL (ref 0.0–0.1)
Basophils Relative: 0 %
Eosinophils Absolute: 0 K/uL (ref 0.0–0.5)
Eosinophils Relative: 0 %
HCT: 43.2 % (ref 39.0–52.0)
Hemoglobin: 14.3 g/dL (ref 13.0–17.0)
Immature Granulocytes: 1 %
Lymphocytes Relative: 14 %
Lymphs Abs: 1.2 K/uL (ref 0.7–4.0)
MCH: 28.8 pg (ref 26.0–34.0)
MCHC: 33.1 g/dL (ref 30.0–36.0)
MCV: 87.1 fL (ref 80.0–100.0)
Monocytes Absolute: 0.2 K/uL (ref 0.1–1.0)
Monocytes Relative: 2 %
Neutro Abs: 6.9 K/uL (ref 1.7–7.7)
Neutrophils Relative %: 83 %
Platelets: 223 K/uL (ref 150–400)
RBC: 4.96 MIL/uL (ref 4.22–5.81)
RDW: 14.4 % (ref 11.5–15.5)
WBC: 8.4 K/uL (ref 4.0–10.5)
nRBC: 0 % (ref 0.0–0.2)

## 2024-10-12 LAB — COMPREHENSIVE METABOLIC PANEL WITH GFR
ALT: 13 U/L (ref 0–44)
AST: 21 U/L (ref 15–41)
Albumin: 3.4 g/dL — ABNORMAL LOW (ref 3.5–5.0)
Alkaline Phosphatase: 66 U/L (ref 38–126)
Anion gap: 16 — ABNORMAL HIGH (ref 5–15)
BUN: 21 mg/dL (ref 8–23)
CO2: 17 mmol/L — ABNORMAL LOW (ref 22–32)
Calcium: 9.4 mg/dL (ref 8.9–10.3)
Chloride: 103 mmol/L (ref 98–111)
Creatinine, Ser: 0.88 mg/dL (ref 0.61–1.24)
GFR, Estimated: >60 mL/min
Glucose, Bld: 134 mg/dL — ABNORMAL HIGH (ref 70–99)
Potassium: 4.1 mmol/L (ref 3.5–5.1)
Sodium: 137 mmol/L (ref 135–145)
Total Bilirubin: 0.4 mg/dL (ref 0.0–1.2)
Total Protein: 7 g/dL (ref 6.5–8.1)

## 2024-10-12 LAB — HEPARIN LEVEL (UNFRACTIONATED): Heparin Unfractionated: 0.59 [IU]/mL (ref 0.30–0.70)

## 2024-10-12 LAB — MAGNESIUM: Magnesium: 2.4 mg/dL (ref 1.7–2.4)

## 2024-10-12 LAB — PROTIME-INR
INR: 1.1 (ref 0.8–1.2)
Prothrombin Time: 15 s (ref 11.4–15.2)

## 2024-10-12 LAB — PHOSPHORUS: Phosphorus: 3.9 mg/dL (ref 2.5–4.6)

## 2024-10-12 MED ORDER — ENOXAPARIN SODIUM 80 MG/0.8ML IJ SOSY
1.0000 mg/kg | PREFILLED_SYRINGE | Freq: Two times a day (BID) | INTRAMUSCULAR | 0 refills | Status: DC
  Filled 2024-10-12: qty 48, 30d supply, fill #0

## 2024-10-12 MED ORDER — OXYCODONE HCL 5 MG PO TABS
5.0000 mg | ORAL_TABLET | Freq: Four times a day (QID) | ORAL | 0 refills | Status: AC | PRN
  Filled 2024-10-12: qty 20, 5d supply, fill #0

## 2024-10-12 MED ORDER — METHOCARBAMOL 500 MG PO TABS
500.0000 mg | ORAL_TABLET | Freq: Three times a day (TID) | ORAL | 0 refills | Status: AC
  Filled 2024-10-12: qty 30, 10d supply, fill #0

## 2024-10-12 MED ORDER — SODIUM BICARBONATE 650 MG PO TABS
650.0000 mg | ORAL_TABLET | Freq: Two times a day (BID) | ORAL | Status: DC
  Administered 2024-10-12: 650 mg via ORAL
  Filled 2024-10-12: qty 1

## 2024-10-12 MED ORDER — ENOXAPARIN SODIUM 80 MG/0.8ML IJ SOSY
1.0000 mg/kg | PREFILLED_SYRINGE | Freq: Two times a day (BID) | INTRAMUSCULAR | Status: DC
  Administered 2024-10-12: 65 mg via SUBCUTANEOUS
  Filled 2024-10-12: qty 0.8

## 2024-10-12 MED ORDER — SODIUM BICARBONATE 650 MG PO TABS
650.0000 mg | ORAL_TABLET | Freq: Two times a day (BID) | ORAL | 0 refills | Status: AC
  Filled 2024-10-12: qty 6, 3d supply, fill #0

## 2024-10-12 MED ORDER — GABAPENTIN 300 MG PO CAPS
300.0000 mg | ORAL_CAPSULE | Freq: Three times a day (TID) | ORAL | 0 refills | Status: AC
  Filled 2024-10-12: qty 30, 10d supply, fill #0

## 2024-10-12 NOTE — Care Management Important Message (Signed)
 Important Message  Patient Details  Name: CYLAS FALZONE MRN: [id number] Date of Birth: 1955/11/01   Important Message Given:  Yes - Medicare IM     Jennie Laneta Dragon 10/12/2024, 2:32 PM

## 2024-10-12 NOTE — TOC CM/SW Note (Signed)
 PT has recommended HHPT at dc. CM spoke c/pt at bedside. Pt is not homebound and declines outpatient PT. Pt states he will pursue outpatient PT after his hip surgery. Dr. Ernie, orthopedics, is recommending ortho follow-up within two weeks. Pt has a cane and states he is pretty sure that there is a walker at his apt complex he can use if needed.

## 2024-10-12 NOTE — Plan of Care (Signed)

## 2024-10-12 NOTE — Progress Notes (Addendum)
 Patient ID: Jonathan Dickson, male   DOB: 10/05/1955, 69 y.o.   MRN: [id number]  Admitted for left hip pain and lower extremity pain  Imaging: CLINICAL DATA:  Pain.  Evaluation of avascular necrosis.   EXAM: MR OF THE LEFT HIP WITHOUT CONTRAST   TECHNIQUE: Multiplanar, multisequence MR imaging was performed. No intravenous contrast was administered.   COMPARISON:  Radiographs dated 10/08/2024.   FINDINGS: Bones/Hip:   Avascular necrosis of the superior and anterior left femoral head involving the majority of the weight-bearing articular surface with pronounced surrounding marrow edema extending through the femoral neck. No appreciable evidence of subchondral collapse. Underlying mild marrow edema and subchondral cystic change of the left superior acetabulum. Small left hip joint effusion. Periarticular soft tissue/intramuscular edema involving the left adductor and adjacent anterior compartment musculature.   Large field-of-view coronal images demonstrate less pronounced avascular necrosis of the right superolateral femoral head without significant surrounding marrow edema.   Mild joint space narrowing of the bilateral hips. Sacroiliac joints and pubic symphysis are within normal limits. Degenerative changes of the visualized lower lumbar spine.   Soft tissue and Muscle:   Muscle bulk appears age-appropriate and relatively symmetric bilaterally.There is a partial-thickness undersurface tear of the left hamstring conjoint tendon origin.Gluteal cuff tendon insertions are intact.Visualized intrapelvic contents are grossly unremarkable. No enlarged lymph nodes identified in the field of view.   IMPRESSION: 1. 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 No appreciable evidence of subchondral collapse. 2. Small left hip  joint effusion. Periarticular soft tissue/intramuscular edema involving the left adductor and adjacent anterior compartment musculature. 3. Mild marrow edema and subchondral cystic change of the left superior acetabulum. 4. Large field-of-view coronal images demonstrate less pronounced avascular necrosis of the right superolateral femoral head without significant surrounding marrow edema. 5. Mild joint space narrowing of the bilateral hips. 6. Low-grade partial-thickness undersurface tear of the left hamstring conjoint tendon origin.     Electronically Signed   By: Harrietta Sherry M.D.   Assessment: 1.  Clinical findings of left hip pain supported by MRI findings of acute on chronic avascular necrosis involving his left femoral head without femoral head collapse but reactive femoral head and neck edema consistent with his source of pain. 2. Acute B LE DVT  Plan: This represents a little bit of a challenge based on his pain level and no diagnosis of DVT.  There is no evidence of any acute pathology, femoral head collapse or femoral neck fracture that would necessitate urgent surgical evaluation.  This is definitely a clinical presentation that is seen in the outpatient setting.  It may be a challenge to get his hip addressed through arthroplasty during this hospitalization.  I would recommend that he be discharged with pain medication a walker weightbearing as tolerated through his left lower extremity with plans to follow-up with me in the office in the next week or 2 so we can find a surgical time available electively.  Medical management of DVT and will need medical clearance related to the DVT prior to proceeding with arhroplasty

## 2024-10-12 NOTE — Discharge Summary (Signed)
 " Physician Discharge Summary   Patient: Jonathan Dickson MRN: [id number] DOB: 04/17/1956  Admit date:     10/08/2024  Discharge date: 10/12/2024  Discharge Physician: Alejandro Marker, DO   PCP: Theotis Haze ORN, NP   Recommendations at discharge:   Follow-up with PCP within 1 to 2 weeks repeat CBC, CMP, mag, Phos within 1 week Follow-up with vascular surgery in outpatient setting if needed Follow-up with orthopedic surgery Dr. Ernie in the clinic within 2 weeks to arrange outpatient arthroplasty Follow-up with hematology oncology Dr. Onesimo for further evaluation and continue anticoagulation with 1 mg/kg of enoxaparin  twice daily  Discharge Diagnoses: Principal Problem:   Acute DVT (deep venous thrombosis) (HCC)  Resolved Problems:   * No resolved hospital problems. [facility], [place] Course: Jonathan Dickson is a 69 y.o. 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 Vascular Surgery, Orthopedic Surgery, and Medical Oncology consulted for further evaluation and workup.  Further workup was done and he improved and oncology recommended antibiotics was milligram per kilogram of enoxaparin  twice daily and orthopedic surgery recommended no inpatient surgery.  He is medically stable for discharge at this time will need to follow-up with PCP, medical oncology, orthopedic surgery vascular surgery outpatient setting.  He declined home health PT and OT and also declined outpatient PT.  Assessment and Plan:   BLE DVTs: Unclear etiology/progression despite therapeutic INR, but can not exclude malignancy as source.Hematology consulted for [facility] per EDP; apprec eval/recs and recommending continuing Heparin  gtt and considering Full Dose Enoxaparin  @ D/C -Vascular  surgery consulted; apprec eval/recs and they had no plans for percutaneous mechanical thrombectomy at this time as it does not involve the common femoral artery and no significant swelling in the left leg -IV heparin  gtt for now and will need to run the cost of Eliquis . PT-INR went from 30.9-2.8 -> 25.1-2.2 -F/u PSA and was 1.42 - Has seen Dr. Melodye in hematology/Oncology and then Dr. Onesimo - Dr. Onesimo feels that his severe CHF with an EF of 25% could be a risk factor - Will need outpatient hypercoagulable workup. - Oncology evaluating for malignancy and ordering a CT Abdomen pelvis w/ contrast and getting this was done and showed No evidence of metastatic disease in the abdomen or pelvis, Sigmoid diverticulosis,  Hiatal hernia.. - He was transition to 1 mg/kg of enoxaparin  SQ twice daily and will be following up with oncology in outpatient setting and appointment is being arranged   Avascular Necrosis of L Femoral Head -PT/OT consulted; apprec eval/recs pending -Orthopedic surgery Dr. Ernie consulted; apprec eval/recs and they are recommending obtaining MRI of the hip which is still pending to evaluate if this is acute versus chronic AVN.;  -MRI done and showed Avascular necrosis of the superior and anterior left femoral head involving the majority of the weight-bearing articular surface with pronounced surrounding marrow edema extending through the femoral neck. No appreciable evidence of subchondral collapse. Small left hip joint effusion. Periarticular soft tissue/intramuscular edema involving the left adductor and adjacent anterior compartment musculature. Mild marrow edema and subchondral cystic change of the left superior acetabulum. Large field-of-view coronal images demonstrate less pronounced avascular necrosis of the right superolateral femoral head without significant surrounding marrow edema.Mild joint space narrowing of the  bilateral hips.  Low-grade partial-thickness undersurface tear of the  left hamstring conjoint tendon origin. -Multimodal pain control w/ tylenol , gabapentin , robaxin , and oxycodone  prn -Orthopedic surgery recommends that since there is no evidence of acute pathology femoral head collapse for femoral neck fracture that it does not necessitate urgent surgical intervention and the orthopedic team recommends discharging home with pain medication and weightbearing as tolerated through the left extremity and following up in the clinic to find surgical time for elective arthroplasty   HFrEF: C/w Coreg  12.5mg  BID, atorvastatin  80mg  daily, Enresto 49-51mg  BID, lasix  20mg  daily (pta 40mg  prn) and Farxiga  10mg  daily.  Continue to monitor for signs and symptoms of volume overload. Holding Spironolactone  12.5 mg p.o. daily.  Intake/Output Summary (Last 24 hours) at 10/12/2024 2342 Last data filed at 10/12/2024 0830 Gross per 24 hour  Intake --  Output 952 ml  Net -952 ml    Hypokalemia: K+ is now 4.1. CTM and Replete as Necessary. Repeat CMP in the AM  Hypophosphatemia: Phos Level is now 3.9. RCTM & Trend & Repeat CMP in the AM  HLD: C/w Atorvastatin  80 mg po Daily   Metabolic Acidosis: Mild. CO2 is now 17, AG is 16, and Chloride Lvl is now 103.  Start sodium bicarbonate  tabs 650 g p.o. twice daily x 3 days and CTM and Trend and repeat CMP within 1 week  Hx of IDA: Hgb/Hct Trend:  Recent Labs  Lab 10/09/24 1107 10/10/24 0536 10/11/24 0522 10/12/24 0508  HGB 14.9 14.3 13.4 14.3  HCT 45.5 43.0 40.4 43.2  MCV 88.7 86.9 86.0 87.1  -Continue monitor for signs and symptoms of bleeding as patient is anticoag with heparin  drip. No overt bleeding noted.  Repeat CBC in a.m. -Has had outpatient endoscopy and colonoscopy with did not explain the reason for his IDA but the team is recommending possible outpatient capsule endoscopy  Amyloid Cardiomyopathy: C/w Tafamidis  61 mg po Daily   Diabetes Mellitus 2: Continue Farxiga  10 mg p.o. daily.  Holding metformin  500 mg p.o. CTM  and Trend CBG per Protocol. No results for input(s): GLUCAP in the last 720 hours.  Hypoalbuminemia: Patient's Albumin Lvl is now 3.4. CTM & Trend & repeat CMP in the AM  Consultants: Vascular surgery, orthopedic surgery, hematology/oncology Procedures performed: As delineated as above Disposition: Home (Refused Home Health and so Orders for Home Health not placed) Diet recommendation:  Discharge Diet Orders (From admission, onward)     Start     Ordered   10/12/24 0000  Diet Carb Modified        10/12/24 1358   10/12/24 0000  Diet - low sodium heart healthy        10/12/24 1358           Cardiac diet  DISCHARGE MEDICATION: Allergies as of 10/12/2024       Reactions   Peanut-containing Drug Products Swelling   Penicillins Other (See Comments)   CONVULSIONS   Decadron  [dexamethasone ] Itching   Ivp Dye [iodinated Contrast Media]         Medication List     STOP taking these medications    warfarin 5 MG tablet Commonly known as: COUMADIN        TAKE these medications    acetaminophen  325 MG tablet Commonly known as: TYLENOL  Take 2 tablets (650 mg total) by mouth every 6 (six) hours as needed for mild pain (pain score 1-3) (or Fever >/= 101).   albuterol  108 (90 Base) MCG/ACT inhaler Commonly known  as: VENTOLIN  HFA INHALE 2 PUFFS INTO THE LUNGS EVERY 6 HOURS AS NEEDED FOR WHEEZING OR SHORTNESS OF BREATH   ALKA-SELTZER PLUS COLD PO Take 1-2 tablets by mouth 2 (two) times daily as needed (cold symptoms).   atorvastatin  80 MG tablet Commonly known as: LIPITOR  Take 1 tablet (80 mg total) by mouth daily.   carvedilol  12.5 MG tablet Commonly known as: COREG  TAKE 1 TABLET(12.5 MG) BY MOUTH TWICE DAILY WITH A MEAL. NEED FOLLOW UP APPOINTMENT FOR ANYMORE REFILLS   cholecalciferol  25 MCG (1000 UNIT) tablet Commonly known as: VITAMIN D3 Take 1,000 Units by mouth daily.   cyanocobalamin  1000 MCG tablet Commonly known as: VITAMIN B12 Take 1,000 mcg by mouth  daily.   enoxaparin  80 MG/0.8ML injection Commonly known as: LOVENOX  Inject 0.65 mLs (65 mg total) into the skin every 12 (twelve) hours.   Entresto  49-51 MG Generic drug: sacubitril -valsartan  Take 1 tablet by mouth 2 (two) times daily.   Farxiga  10 MG Tabs tablet Generic drug: dapagliflozin  propanediol TAKE 1 TABLET(10 MG) BY MOUTH DAILY   fluticasone  50 MCG/ACT nasal spray Commonly known as: FLONASE  SHAKE LIQUID AND USE 1 SPRAY IN EACH NOSTRIL DAILY What changed: See the new instructions.   furosemide  40 MG tablet Commonly known as: LASIX  TAKE 1 TABLET BY MOUTH EVERY DAY AS NEEDED   gabapentin  300 MG capsule Commonly known as: NEURONTIN  Take 1 capsule (300 mg total) by mouth 3 (three) times daily. What changed:  when to take this reasons to take this   metFORMIN  500 MG tablet Commonly known as: GLUCOPHAGE  TAKE 1 TABLET(500 MG) BY MOUTH DAILY WITH BREAKFAST   methocarbamol  500 MG tablet Commonly known as: ROBAXIN  Take 1 tablet (500 mg total) by mouth 3 (three) times daily.   Misc. Devices Misc by Does not apply route. Flutter valve daily   Neffy  2 MG/0.1ML Soln Generic drug: EPINEPHrine  Place 1 spray into the nose once as needed for up to 1 dose.   oxyCODONE  5 MG immediate release tablet Commonly known as: Oxy IR/ROXICODONE  Take 1 tablet (5 mg total) by mouth every 6 (six) hours as needed for up to 5 days for severe pain (pain score 7-10).   sodium bicarbonate  650 MG tablet Take 1 tablet (650 mg total) by mouth 2 (two) times daily for 3 days.   spironolactone  25 MG tablet Commonly known as: ALDACTONE  Take 0.5 tablets (12.5 mg total) by mouth daily.   Vicks VapoInhaler Inha Inhale 1 Dose into the lungs daily as needed (congestion).   Vyndamax  61 MG Caps Generic drug: Tafamidis  Take 1 capsule (61 mg total) by mouth daily.       Discharge Exam: Filed Weights   10/09/24 1500 10/09/24 1738  Weight: 70 kg 66 kg   Vitals:   10/12/24 0900 10/12/24 1150   BP: 121/68 132/70  Pulse: 87 87  Resp:  18  Temp:  98 F (36.7 C)  SpO2:  95%   Examination: Physical Exam:  Constitutional: Thin African-American male in no acute distress Respiratory: Slightly diminished to auscultation bilaterally, no wheezing, rales, rhonchi or crackles. Normal respiratory effort and patient is not tachypenic. No accessory muscle use.  Unlabored breathing Cardiovascular: RRR, no murmurs / rubs / gallops. S1 and S2 auscultated. Very mild extremity edema Abdomen: Soft, non-tender, non-distended. Bowel sounds positive.  GU: Deferred. Musculoskeletal: No clubbing / cyanosis of digits/nails. No joint deformity upper and lower extremities.  Skin: No rashes, lesions, ulcers limited skin evaluation. No induration; Warm and dry.  Neurologic: CN 2-12 grossly intact with no focal deficits. Romberg sign and cerebellar reflexes not assessed.  Psychiatric: Normal judgment and insight. Alert and oriented x 3. Normal mood and appropriate affect.   Condition at discharge: stable  The results of significant diagnostics from this hospitalization (including imaging, microbiology, ancillary and laboratory) are listed below for reference.   Imaging Studies: CT ABDOMEN PELVIS W CONTRAST Result Date: 10/12/2024 EXAM: CT ABDOMEN AND PELVIS WITH CONTRAST 10/11/2024 10:11:17 PM TECHNIQUE: CT of the abdomen and pelvis was performed with the administration of 75 mL of iohexol  (OMNIPAQUE ) 350 MG/ML injection. Multiplanar reformatted images are provided for review. Automated exposure control, iterative reconstruction, and/or weight-based adjustment of the mA/kV was utilized to reduce the radiation dose to as low as reasonably achievable. COMPARISON: None available. CLINICAL HISTORY: Bilateral lower extremity DVT on therapeutic INR, have to rule out occult malignancy. FINDINGS: LOWER CHEST: Lung bases show no focal infiltrate or sizable effusion. No parenchymal nodules are noted. LIVER: Fatty  infiltration of the liver is seen. GALLBLADDER AND BILE DUCTS: Gallbladder is within normal limits. No biliary ductal dilatation. SPLEEN: No acute abnormality. PANCREAS: No acute abnormality. ADRENAL GLANDS: No acute abnormality. KIDNEYS, URETERS AND BLADDER: The kidneys are within normal limits. No stones in the kidneys or ureters. No hydronephrosis. No perinephric or periureteral stranding. The bladder is well distended. GI AND BOWEL: Sliding type hiatal hernia is noted. Stomach demonstrates no acute abnormality. No obstructive or inflammatory changes of the colon are seen. Extensive diverticular change in the sigmoid is noted without evidence of diverticulitis. The appendix is not well visualized, although no inflammatory changes to suggest appendicitis are noted. There is no bowel obstruction. PERITONEUM AND RETROPERITONEUM: No ascites. No free air. VASCULATURE: Aorta is normal in caliber. LYMPH NODES: No lymphadenopathy. REPRODUCTIVE ORGANS: No acute abnormality. BONES AND SOFT TISSUES: No acute osseous abnormality. No focal soft tissue abnormality. IMPRESSION: 1. No evidence of metastatic disease in the abdomen or pelvis. 2. Sigmoid diverticulosis. 3. Hiatal hernia. Electronically signed by: Oneil Devonshire MD MD 10/12/2024 01:54 AM EST RP Workstation: [id number]   MR HIP LEFT WO CONTRAST Result Date: 10/11/2024 CLINICAL DATA:  Pain.  Evaluation of avascular necrosis. EXAM: MR OF THE LEFT HIP WITHOUT CONTRAST TECHNIQUE: Multiplanar, multisequence MR imaging was performed. No intravenous contrast was administered. COMPARISON:  Radiographs dated 10/08/2024. 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 No appreciable evidence of subchondral collapse. Underlying mild marrow edema and subchondral cystic change of the left superior  acetabulum. Small left hip joint effusion. Periarticular soft tissue/intramuscular edema involving the left adductor and adjacent anterior compartment musculature. Large field-of-view coronal images demonstrate less pronounced avascular necrosis of the right superolateral femoral head without significant surrounding marrow edema. Mild joint space narrowing of the bilateral hips. Sacroiliac joints and pubic symphysis are within normal limits. Degenerative changes of the visualized lower lumbar spine. Soft tissue and Muscle: Muscle bulk appears age-appropriate and relatively symmetric bilaterally.There is a partial-thickness undersurface tear of the left hamstring conjoint tendon origin.Gluteal cuff tendon insertions are intact.Visualized intrapelvic contents are grossly unremarkable. No enlarged lymph nodes identified in the field of view. IMPRESSION: 1. 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 No appreciable evidence of subchondral collapse. 2. Small left hip joint effusion. Periarticular soft  tissue/intramuscular edema involving the left adductor and adjacent anterior compartment musculature. 3. Mild marrow edema and subchondral cystic change of the left superior acetabulum. 4. Large field-of-view coronal images demonstrate less pronounced avascular necrosis of the right superolateral femoral head without significant surrounding marrow edema. 5. Mild joint space narrowing of the bilateral hips. 6. Low-grade partial-thickness undersurface tear of the left hamstring conjoint tendon origin. Electronically Signed   By: Harrietta Sherry M.D.   On: 10/11/2024 18:30   VAS US  ABI WITH/WO TBI Result Date: 10/09/2024  LOWER EXTREMITY DOPPLER STUDY Patient Name:  Jonathan Dickson  Date of Exam:   10/09/2024 Medical Rec #: [id number]      Accession #:    [id number]  Date of Birth: [birth year]      Patient Gender: M Patient Age:   34 years Exam Location:  [facility] Procedure:      VAS US  ABI WITH/WO TBI Referring Phys: [facility] PFEIFFER --------------------------------------------------------------------------------  Indications: Patient states he has been having acute, significant left              lower extremity rest pain X 3 days in the thigh and hip, which              nothing relieves. Patient endorses a history of chronic calf              claudication, stating he can walk about half a block before              resting, and              walking up hills/stairs makes it worse. High Risk Factors: Hypertension, hyperlipidemia, Diabetes, past history of                    smoking, prior MI, coronary artery disease. Other Factors: AICD, on Coumadin . CHF. NICM, Cardiac Amyloid, clotting                disorder (patient is unsure of which one), Remote history of                multiple remote DVT and PE, bilaterally.  Comparison Study: Prior ABI done 01/09/2018 Performing Technologist: Rachel Pellet RVS  Examination Guidelines: A complete evaluation includes at minimum, Doppler waveform signals and systolic blood pressure reading at the level of bilateral brachial, anterior tibial, and posterior tibial arteries, when vessel segments are accessible. Bilateral testing is considered an integral part of a complete examination. Photoelectric Plethysmograph (PPG) waveforms and toe systolic pressure readings are included as required and additional duplex testing as needed. Limited examinations for reoccurring indications may be performed as noted.  ABI Findings: +---------+------------------+-----+-----------+--------+ Right    Rt Pressure (mmHg)IndexWaveform   Comment  +---------+------------------+-----+-----------+--------+ Brachial 139                    triphasic           +---------+------------------+-----+-----------+--------+ PTA      165                1.19 multiphasic         +---------+------------------+-----+-----------+--------+ DP       157               1.13 multiphasic         +---------+------------------+-----+-----------+--------+ Great Toe91                0.65                     +---------+------------------+-----+-----------+--------+ +---------+------------------+-----+---------+-------+  Left     Lt Pressure (mmHg)IndexWaveform Comment +---------+------------------+-----+---------+-------+ Brachial 126                    triphasic        +---------+------------------+-----+---------+-------+ PTA      113               0.81 biphasic         +---------+------------------+-----+---------+-------+ DP       122               0.88 biphasic         +---------+------------------+-----+---------+-------+ Great Toe63                0.45                  +---------+------------------+-----+---------+-------+ +-------+-----------+-----------+------------+------------+ ABI/TBIToday's ABIToday's TBIPrevious ABIPrevious TBI +-------+-----------+-----------+------------+------------+ Right  1.19       0.65       1.21        0.77         +-------+-----------+-----------+------------+------------+ Left   0.88       0.45       1.15        1.02         +-------+-----------+-----------+------------+------------+ Bilateral ABIs and TBIs appear decreased compared to prior study on 01/09/2018.  Summary: Right: Resting right ankle-brachial index is within normal range. Right toe pressure is >60 mmHg which suggests adequate perfusion for healing. Left: Resting left ankle-brachial index indicates mild left lower extremity arterial disease. The left toe-brachial index is abnormal.  *See table(s) above for measurements and observations.  Electronically signed by Fonda Rim on 10/09/2024 at 1:18:19 PM.    Final    VAS US  LOWER EXTREMITY ARTERIAL DUPLEX Result Date: 10/09/2024 LOWER EXTREMITY ARTERIAL DUPLEX  STUDY Patient Name:  Jonathan Dickson  Date of Exam:   10/09/2024 Medical Rec #: [id number]      Accession #:    [id number] Date of Birth: 04-01-1956      Patient Gender: M Patient Age:   45 years Exam Location:  [facility] Procedure:      VAS US  LOWER EXTREMITY ARTERIAL DUPLEX Referring Phys: --------------------------------------------------------------------------------  Indications: Patient states he has been having acute, significant left lower              extremity rest pain X 3 days in the thigh and hip, which nothing              relieves. Patient endorses a history of chronic calf claudication,              stating he can walk about half a block before resting, and walking              up hills/stairs makes it worse. High Risk Factors: Hypertension, hyperlipidemia, Diabetes, past history of                    smoking, prior MI. Other Factors: AICD, on Coumadin . CHF. NICM, Cardiac Amyloid, clotting disorder                (patient is unsure of which one), Remote history of mulitple                remote DVT and PE, bilaterally.  Current ABI: ABI: R:1.19 L:0.88              TBI: R:0.65 L:0.45 Comparison Study: No prior LEA on file Performing Technologist: Candace  Rachel RVS  Examination Guidelines: A complete evaluation includes B-mode imaging, spectral Doppler, color Doppler, and power Doppler as needed of all accessible portions of each vessel. Bilateral testing is considered an integral part of a complete examination. Limited examinations for reoccurring indications may be performed as noted.  +-----------+--------+-----+--------+---------+-----------+ RIGHT      PSV cm/sRatioStenosisWaveform Comments    +-----------+--------+-----+--------+---------+-----------+ CFA Prox   79                   triphasic            +-----------+--------+-----+--------+---------+-----------+ SFA Prox   71                   triphasic             +-----------+--------+-----+--------+---------+-----------+ SFA Distal 83                   triphasic            +-----------+--------+-----+--------+---------+-----------+ POP Prox   29                   triphasic            +-----------+--------+-----+--------+---------+-----------+ TP Trunk   30                   triphasic            +-----------+--------+-----+--------+---------+-----------+ PTA Mid    61                   triphasic            +-----------+--------+-----+--------+---------+-----------+ PTA Distal                               collaterals +-----------+--------+-----+--------+---------+-----------+ PERO Distal                              collaterals +-----------+--------+-----+--------+---------+-----------+  +-----------+--------+-----+--------+--------------------+-----------------+ LEFT       PSV cm/sRatioStenosisWaveform            Comments          +-----------+--------+-----+--------+--------------------+-----------------+ EIA Distal 98                   multiphasic                           +-----------+--------+-----+--------+--------------------+-----------------+ CFA Prox   45                   multiphasic                           +-----------+--------+-----+--------+--------------------+-----------------+ CFA Distal 58                   multiphasic                           +-----------+--------+-----+--------+--------------------+-----------------+ DFA        56                   multiphasic                           +-----------+--------+-----+--------+--------------------+-----------------+ SFA Prox   18  biphasic to dampened                  +-----------+--------+-----+--------+--------------------+-----------------+ SFA Mid    21                   monophasic                            +-----------+--------+-----+--------+--------------------+-----------------+ SFA Distal  12                   monophasic                            +-----------+--------+-----+--------+--------------------+-----------------+ POP Prox   16                   monophasic                            +-----------+--------+-----+--------+--------------------+-----------------+ POP Distal 10                   monophasic                            +-----------+--------+-----+--------+--------------------+-----------------+ TP Trunk   19                   monophasic                            +-----------+--------+-----+--------+--------------------+-----------------+ ATA Prox   18                   monophasic          collaterals noted +-----------+--------+-----+--------+--------------------+-----------------+ ATA Mid    11                   monophasic          collaterals noted +-----------+--------+-----+--------+--------------------+-----------------+ ATA Distal 13                   monophasic                            +-----------+--------+-----+--------+--------------------+-----------------+ PTA Prox   19                   monophasic                            +-----------+--------+-----+--------+--------------------+-----------------+ PTA Mid    30                   monophasic                            +-----------+--------+-----+--------+--------------------+-----------------+ PTA Distal 49                   monophasic                            +-----------+--------+-----+--------+--------------------+-----------------+ PERO Prox  8                    monophasic                            +-----------+--------+-----+--------+--------------------+-----------------+  PERO Mid   13                   monophasic                            +-----------+--------+-----+--------+--------------------+-----------------+ PERO Distal14                   monophasic                             +-----------+--------+-----+--------+--------------------+-----------------+  Summary: Right: Triphasic waveforms noted in the common femoral, femoral, and popliteal arteries. Left: Multiphasic waveforms noted in the distal external iliac, common femoral, and profunda femoral arteries. Waveforms are biphasic in the proximal superficial artery, but are noted to change to dampened biphasic/monophasic in the proximal-mid segments. There is significant plaque noted in the distal superficial femoral and proximal popliteal arteries. Monophasic flow noted throughout the posterior tibial, anterior tibial, and peroneal arteries. Collaterals noted throughout the calf.  See table(s) above for measurements and observations.  Electronically signed by Fonda Rim on 10/09/2024 at 1:17:55 PM.    Final    VAS US  LOWER EXTREMITY VENOUS (DVT) (7a-5p) Result Date: 10/09/2024  Lower Venous DVT Study Patient Name:  USBALDO PANNONE  Date of Exam:   10/09/2024 Medical Rec #: [id number]      Accession #:    [id number] Date of Birth: [birth year]      Patient Gender: M Patient Age:   63 years Exam Location:  [facility] Procedure:      VAS US  LOWER EXTREMITY VENOUS (DVT) Referring Phys: [facility] PFEIFFER --------------------------------------------------------------------------------  Indications: Thigh and hip pain X 3 days.  Risk Factors: Remote history of DVT and PE. Anticoagulation: Coumadin . Comparison Study: Prior negative left LEV done 08/04/19. Prior negative bilateral                   LEV done 05/12/18 Performing Technologist: Alberta Lis RVS  Examination Guidelines: A complete evaluation includes B-mode imaging, spectral Doppler, color Doppler, and power Doppler as needed of all accessible portions of each vessel. Bilateral testing is considered an integral part of a complete examination. Limited examinations for reoccurring indications may be performed as noted. The reflux portion of the exam is performed with the patient in  reverse Trendelenburg.  +---------+---------------+---------+-----------+----------+------------------+ RIGHT    CompressibilityPhasicitySpontaneityPropertiesThrombus Aging     +---------+---------------+---------+-----------+----------+------------------+ CFV      Full           Yes      Yes                  Rouleaux flow      +---------+---------------+---------+-----------+----------+------------------+ SFJ      Full                                                            +---------+---------------+---------+-----------+----------+------------------+ FV Prox  Partial                                      Age Indeterminate  +---------+---------------+---------+-----------+----------+------------------+ FV DistalPartial                                      ?  acute on chronic +---------+---------------+---------+-----------+----------+------------------+ PFV      Partial        Yes      No                   Acute              +---------+---------------+---------+-----------+----------+------------------+ POP      Partial        No       No                   Acute              +---------+---------------+---------+-----------+----------+------------------+ PTV      Partial                                      Age Indeterminate  +---------+---------------+---------+-----------+----------+------------------+ PERO     Partial                                      Age Indeterminate  +---------+---------------+---------+-----------+----------+------------------+   +---------+---------------+---------+-----------+----------+-------------------+ LEFT     CompressibilityPhasicitySpontaneityPropertiesThrombus Aging      +---------+---------------+---------+-----------+----------+-------------------+ CFV      Full           Yes      Yes                                       +---------+---------------+---------+-----------+----------+-------------------+ SFJ      Full                                                             +---------+---------------+---------+-----------+----------+-------------------+ FV Prox  Partial        Yes      No                   Acute               +---------+---------------+---------+-----------+----------+-------------------+ FV Mid   Partial                                      Acute               +---------+---------------+---------+-----------+----------+-------------------+ FV DistalPartial        No       No                   Acute               +---------+---------------+---------+-----------+----------+-------------------+ PFV      Partial        Yes      No                   Acute               +---------+---------------+---------+-----------+----------+-------------------+ POP      Partial        Yes      No  Acute on chronic    +---------+---------------+---------+-----------+----------+-------------------+ PTV      Partial        No       No                   Acute               +---------+---------------+---------+-----------+----------+-------------------+ PERO     Partial        No       No                   Acute               +---------+---------------+---------+-----------+----------+-------------------+ Gastroc  Full                                                             +---------+---------------+---------+-----------+----------+-------------------+ EIV                     Yes      No                   patent by color and                                                       Doppler             +---------+---------------+---------+-----------+----------+-------------------+     Summary: RIGHT: - Findings consistent with acute deep vein thrombosis involving the right proximal profunda vein, and right popliteal vein.  - Findings consistent  with age indeterminate deep vein thrombosis involving the right femoral vein, right posterior tibial veins, and right peroneal veins. Possible acute on chronic DVT noted distal femoral vein.  - No cystic structure found in the popliteal fossa.  LEFT: - Findings consistent with acute deep vein thrombosis involving the left femoral vein, left proximal profunda vein, left popliteal vein, left posterior tibial veins, and left peroneal veins.  - No cystic structure found in the popliteal fossa.  *See table(s) above for measurements and observations. Electronically signed by Fonda Rim on 10/09/2024 at 1:15:33 PM.    Final    DG Femur Min 2 Views Left Result Date: 10/08/2024 EXAM: 2 VIEW(S) XRAY OF THE LEFT FEMUR 10/08/2024 10:32:00 PM COMPARISON: None available. 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 No malalignment. SOFT TISSUES: Vascular calcifications. IMPRESSION: 1. No acute fracture. 2. 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 Electronically signed by: Greig Pique MD MD 10/08/2024 10:39 PM EST RP Workstation: [id number]   DG Hip Unilat W or Wo Pelvis 2-3 Views Left Result Date: 10/08/2024 EXAM: 2 OR MORE VIEW(S) XRAY OF THE UNILATERAL HIP 10/08/2024 10:32:00 PM COMPARISON: None available. CLINICAL HISTORY: Pain FINDINGS: BONES AND JOINTS: No acute fracture. No malalignment. Subtle sclerosis and lucency along the left femoral head, possibly representing AVN. Mild joint space loss of the left hip. Mild degenerative changes of the sacroiliac joints. SOFT TISSUES: Vascular calcifications noted. IMPRESSION: 1. 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 2. Mild left  hip osteoarthrosis. Electronically signed by: Greig Pique MD MD 10/08/2024 10:38 PM EST RP Workstation: [id number]   CUP PACEART REMOTE DEVICE CHECK Result Date: 09/21/2024 ICD Scheduled  remote reviewed. Normal device function.  Presenting rhythm: VS.  HF diagnostics have been abnormal in this monitoring period. Next remote transmission per protocol. - CS, CVRS  Microbiology: Results for orders placed or performed during the hospital encounter of 08/17/23  Blood culture (routine x 2)     Status: None   Collection Time: 08/17/23  5:56 PM   Specimen: BLOOD LEFT FOREARM  Result Value Ref Range Status   Specimen Description BLOOD LEFT FOREARM  Final   Special Requests   Final    BOTTLES DRAWN AEROBIC AND ANAEROBIC Blood Culture results may not be optimal due to an inadequate volume of blood received in culture bottles   Culture   Final    NO GROWTH 5 DAYS Performed at [facility], [address], [place], [place] [address]    Report Status 08/22/2023 FINAL  Final  Blood culture (routine x 2)     Status: None   Collection Time: 08/17/23  8:07 PM   Specimen: BLOOD  Result Value Ref Range Status   Specimen Description BLOOD RIGHT ANTECUBITAL  Final   Special Requests   Final    BOTTLES DRAWN AEROBIC AND ANAEROBIC Blood Culture adequate volume   Culture   Final    NO GROWTH 5 DAYS Performed at [facility], [address] [place], [place] [address]    Report Status 08/22/2023 FINAL  Final   *Note: Due to a large number of results and/or encounters for the requested time period, some results have not been displayed. A complete set of results can be found in Results Review.   Labs: CBC: Recent Labs  Lab 10/09/24 1107 10/10/24 0536 10/11/24 0522 10/12/24 0508  WBC 7.1 8.0 5.6 8.4  NEUTROABS  --   --  3.2 6.9  HGB 14.9 14.3 13.4 14.3  HCT 45.5 43.0 40.4 43.2  MCV 88.7 86.9 86.0 87.1  PLT 187 190 185 223   Basic Metabolic Panel: Recent Labs  Lab 10/09/24 1107 10/10/24 0536 10/11/24 0522 10/12/24 0508  NA 136 135 135 137  K 4.2 3.8 3.3* 4.1  CL 103 101 103 103  CO2 20* 22 20* 17*  GLUCOSE 96 108* 105* 134*  BUN 16 21 20 21   CREATININE 0.99  0.93 0.87 0.88  CALCIUM  9.1 9.1 9.0 9.4  MG  --   --  2.3 2.4  PHOS  --   --  2.3* 3.9   Liver Function Tests: Recent Labs  Lab 10/11/24 0522 10/12/24 0508  AST 18 21  ALT 11 13  ALKPHOS 62 66  BILITOT 0.6 0.4  PROT 6.5 7.0  ALBUMIN 3.3* 3.4*   CBG: No results for input(s): GLUCAP in the last 168 hours.  Discharge time spent: greater than 30 minutes.  Signed: Alejandro Marker, DO [facility] 10/12/2024 "

## 2024-10-12 NOTE — Progress Notes (Signed)
 Physical Therapy Treatment Patient Details Name: Jonathan Dickson MRN: [id number] DOB: 09-04-1956 Today's Date: 10/12/2024   History of Present Illness Pt is a 69 y.o. male admitted 1/9 with BLE DVTs and L hip AVN. 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 Pt expresses no significant concerns about mobility at this time and is able to utilize an elevator rather than negotiate stairs. Pt also reports having access to a RW at home. PT recommends discharge home with HHPT when medically appropriate.    If plan is discharge home, recommend the following: A little help with bathing/dressing/bathroom;Assistance with cooking/housework;Assist for transportation;Help with stairs or ramp for entrance   Can travel by private vehicle        Equipment Recommendations  None recommended by PT (pt declines DME needs, reports he has access to a RW at his home)    Recommendations for Other Services       Precautions / Restrictions Precautions Precautions: Fall Recall of Precautions/Restrictions: Intact Restrictions Weight Bearing Restrictions Per Provider Order: Yes LLE Weight Bearing Per Provider Order: Weight bearing as tolerated     Mobility  Bed Mobility Overal bed mobility: Modified Independent                  Transfers Overall transfer level: Modified independent Equipment used: Rolling walker (2 wheels) Transfers: Sit to/from Stand Sit to Stand: Modified independent (Device/Increase time)                Ambulation/Gait Ambulation/Gait assistance: Modified independent (Device/Increase time) Gait Distance (Feet): 100 Feet Assistive device: Rolling walker (2 wheels) Gait Pattern/deviations: Step-through pattern, Decreased stance time - left Gait velocity: reduced Gait  velocity interpretation: <1.8 ft/sec, indicate of risk for recurrent falls   General Gait Details: pt with slowed step-through gait, reduced stance time on LLE   Stairs             Wheelchair Mobility     Tilt Bed    Modified Rankin (Stroke Patients Only)       Balance Overall balance assessment: Needs assistance Sitting-balance support: No upper extremity supported, Feet supported Sitting balance-Leahy Scale: Good     Standing balance support: Single extremity supported, Reliant on assistive device for balance Standing balance-Leahy Scale: Poor                              Communication Communication Communication: No apparent difficulties  Cognition Arousal: Alert Behavior During Therapy: WFL for tasks assessed/performed   PT - Cognitive impairments: No apparent impairments                         Following commands: Intact      Cueing Cueing Techniques: Verbal cues  Exercises      General Comments General comments (skin integrity, edema, etc.): VSS on RA      Pertinent Vitals/Pain Pain Assessment Pain Assessment: Faces Faces Pain Scale: Hurts little more Pain Location: LLE Pain Descriptors / Indicators: Grimacing Pain Intervention(s): Monitored during session    Home Living                          Prior Function  PT Goals (current goals can now be found in the care plan section) Acute Rehab PT Goals Patient Stated Goal: decrease pain Progress towards PT goals: Progressing toward goals    Frequency    Min 2X/week      PT Plan      Co-evaluation              AM-PAC PT 6 Clicks Mobility   Outcome Measure  Help needed turning from your back to your side while in a flat bed without using bedrails?: None Help needed moving from lying on your back to sitting on the side of a flat bed without using bedrails?: None Help needed moving to and from a bed to a chair (including a  wheelchair)?: A Little Help needed standing up from a chair using your arms (e.g., wheelchair or bedside chair)?: A Little Help needed to walk in hospital room?: A Little Help needed climbing 3-5 steps with a railing? : A Little 6 Click Score: 20    End of Session Equipment Utilized During Treatment: Gait belt Activity Tolerance: Patient tolerated treatment well Patient left: in bed;with call bell/phone within reach Nurse Communication: Mobility status PT Visit Diagnosis: Difficulty in walking, not elsewhere classified (R26.2);Pain Pain - Right/Left: Left Pain - part of body: Hip     Time: 1040-1052 PT Time Calculation (min) (ACUTE ONLY): 12 min  Charges:    $Gait Training: 8-22 mins PT General Charges $$ ACUTE PT VISIT: 1 Visit                     Jonathan Dickson, PT, DPT Acute Rehabilitation Office [phone]    Jonathan Dickson 10/12/2024, 11:47 AM

## 2024-10-13 ENCOUNTER — Telehealth: Payer: Self-pay | Admitting: *Deleted

## 2024-10-13 NOTE — Transitions of Care (Post Inpatient/ED Visit) (Signed)
" ° °  10/13/2024  Name: Jonathan Dickson MRN: [id number] DOB: [birth year]  Today's TOC FU Call Status: Today's TOC FU Call Status:: Unsuccessful Call (1st Attempt) Unsuccessful Call (1st Attempt) Date: 10/13/24  Attempted to reach the patient regarding the most recent Inpatient/ED visit.  Follow Up Plan: Additional outreach attempts will be made to reach the patient to complete the Transitions of Care (Post Inpatient/ED visit) call.   Andrea Dimes RN, BSN [place]  [facility] RN Care Manager [phone]  "

## 2024-10-14 ENCOUNTER — Telehealth: Payer: Self-pay | Admitting: *Deleted

## 2024-10-14 NOTE — Transitions of Care (Post Inpatient/ED Visit) (Signed)
 "  10/14/2024  Name: Jonathan Dickson MRN: [id number] DOB: 1955-11-08  Today's TOC FU Call Status: Today's TOC FU Call Status:: Successful TOC FU Call Completed TOC FU Call Complete Date: 10/14/24  Patient's Name and Date of Birth confirmed. Name, DOB  Transition Care Management Follow-up Telephone Call Date of Discharge: 10/12/24 Discharge Facility: [facility]) Type of Discharge: Inpatient Admission Primary Inpatient Discharge Diagnosis:: Acute DVT How have you been since you were released from the hospital?: Better Any questions or concerns?: No  Items Reviewed: Did you receive and understand the discharge instructions provided?: Yes Medications obtained,verified, and reconciled?: Yes (Medications Reviewed) Any new allergies since your discharge?: No Dietary orders reviewed?: Yes Type of Diet Ordered:: low sodium, heart healthy, carb modified Do you have support at home?: No  Medications Reviewed Today: Medications Reviewed Today     Reviewed by Lucky Andrea LABOR, RN (Registered Nurse) on 10/14/24 at 1231  Med List Status: <None>   Medication Order Taking? Sig Documenting Provider Last Dose Status Informant  acetaminophen  (TYLENOL ) 325 MG tablet [id number] Yes Take 2 tablets (650 mg total) by mouth every 6 (six) hours as needed for mild pain (pain score 1-3) (or Fever >/= 101). Hongalgi, Anand D, MD  Active Self, Pharmacy Records  albuterol  (VENTOLIN  HFA) 108 [phone] Base) MCG/ACT inhaler [id number] Yes INHALE 2 PUFFS INTO THE LUNGS EVERY 6 HOURS AS NEEDED FOR WHEEZING OR SHORTNESS OF BREATH Parrett, Madelin RAMAN, NP  Active Self, Pharmacy Records  Aromatic Inhalants SANDRALEE [place]) INHA [id number] Yes Inhale 1 Dose into the lungs daily as needed (congestion). [provider]  Active Self, Pharmacy Records  atorvastatin  (LIPITOR ) 80 MG tablet [id number] Yes Take 1 tablet (80 mg total) by mouth daily. Bensimhon, Toribio SAUNDERS, MD  Active Self, Pharmacy Records  carvedilol  (COREG ) 12.5  MG tablet [id number] Yes TAKE 1 TABLET(12.5 MG) BY MOUTH TWICE DAILY WITH A MEAL. NEED FOLLOW UP APPOINTMENT FOR ANYMORE REFILLS Bensimhon, Toribio SAUNDERS, MD  Active Self, Pharmacy Records  Chlorphen-Phenyleph-ASA [facility] PLUS COLD PO) [id number] Yes Take 1-2 tablets by mouth 2 (two) times daily as needed (cold symptoms).  [provider]  Active Self, Pharmacy Records           Med Note [facility], ERIN T   Sat Oct 09, 2024  2:00 PM)    cholecalciferol  (VITAMIN D3) 25 MCG (1000 UNIT) tablet [id number] Yes Take 1,000 Units by mouth daily. [provider]  Active Self, Pharmacy Records  cyanocobalamin  (VITAMIN B12) 1000 MCG tablet [id number] Yes Take 1,000 mcg by mouth daily. [provider]  Active Self, Pharmacy Records  dapagliflozin  propanediol (FARXIGA ) 10 MG TABS tablet [id number] Yes TAKE 1 TABLET(10 MG) BY MOUTH DAILY [place], Harlene HERO, FNP  Active Self, Pharmacy Records  enoxaparin  (LOVENOX ) 80 MG/0.8ML injection [id number] Yes Inject 0.65 mLs (65 mg total) into the skin every 12 (twelve) hours. Sherrill Cable [place], [place]  Active   ENTRESTO  49-51 MG [id number] Yes Take 1 tablet by mouth 2 (two) times daily. Bensimhon, Toribio SAUNDERS, MD  Active Self, Pharmacy Records  EPINEPHrine  (NEFFY ) 2 MG/0.1ML SOLN [id number] Yes Place 1 spray into the nose once as needed for up to 1 dose. Theotis Haze ORN, NP  Active Self, Pharmacy Records           Med Note [place], ERIN T   Sat Oct 09, 2024  2:02 PM) Pt has this injection available at home if needed; however, he has never had to use it   fluticasone  (FLONASE )  50 MCG/ACT nasal spray [id number] Yes SHAKE LIQUID AND USE 1 SPRAY IN EACH NOSTRIL DAILY Desai, Nikita S, MD  Active Self, Pharmacy Records  furosemide  (LASIX ) 40 MG tablet [id number] Yes TAKE 1 TABLET BY MOUTH EVERY DAY AS NEEDED Bensimhon, Toribio SAUNDERS, MD  Active Self, Pharmacy Records  gabapentin  (NEURONTIN ) 300 MG capsule [id number] Yes Take 1 capsule (300 mg total) by mouth 3 (three) times  daily. Sheikh, Omair Latif, [place]  Active   metFORMIN  (GLUCOPHAGE ) 500 MG tablet [id number] Yes TAKE 1 TABLET(500 MG) BY MOUTH DAILY WITH BREAKFAST Fleming, Zelda W, NP  Active Self, Pharmacy Records  methocarbamol  (ROBAXIN ) 500 MG tablet [id number] Yes Take 1 tablet (500 mg total) by mouth 3 (three) times daily. Sheikh, Omair [place], DO  Active   Misc. Devices MISC [id number] Yes by Does not apply route. Flutter valve daily [provider]  Active Self, Pharmacy Records  oxyCODONE  (OXY IR/ROXICODONE ) 5 MG immediate release tablet [id number] Yes Take 1 tablet (5 mg total) by mouth every 6 (six) hours as needed for up to 5 days for severe pain (pain score 7-10). Sheikh, Omair [place], [place]  Active   sodium bicarbonate  650 MG tablet [id number] Yes Take 1 tablet (650 mg total) by mouth 2 (two) times daily for 3 days. Sheikh, Omair Latif, DO  Active   spironolactone  (ALDACTONE ) 25 MG tablet [id number] Yes Take 0.5 tablets (12.5 mg total) by mouth daily. Bensimhon, Toribio SAUNDERS, MD  Active Self, Pharmacy Records  Tafamidis  (VYNDAMAX ) 61 MG CAPS [id number] Yes Take 1 capsule (61 mg total) by mouth daily. Bensimhon, Toribio SAUNDERS, MD  Active Self, Pharmacy Records            Home Care and Equipment/Supplies: Were Home Health Services Ordered?: No Any new equipment or medical supplies ordered?: No  Functional Questionnaire: Do you need assistance with bathing/showering or dressing?: No Do you need assistance with meal preparation?: No Do you need assistance with eating?: No Do you have difficulty maintaining continence: No Do you need assistance with getting out of bed/getting out of a chair/moving?: No Do you have difficulty managing or taking your medications?: No  Follow up appointments reviewed: PCP Follow-up appointment confirmed?: No (RNCM assisted with scheduling on 10/22/24) MD Provider Line [phone] Given: No Specialist Hospital Follow-up appointment confirmed?: Yes Date of Specialist  follow-up appointment?: 10/21/24 Follow-Up Specialty Provider:: Dr. Onesimo Do you need transportation to your follow-up appointment?: No Do you understand care options if your condition(s) worsen?: Yes-patient verbalized understanding  SDOH Interventions Today    Flowsheet Row Most Recent Value  SDOH Interventions   Food Insecurity Interventions Intervention Not Indicated  Housing Interventions Intervention Not Indicated  Transportation Interventions Intervention Not Indicated  Utilities Interventions Intervention Not Indicated    Goals Addressed             This Visit's Progress    VBCI Transitions of Care (TOC) Care Plan       Problems:  Recent Hospitalization for treatment of DVT No Hospital Follow Up Provider appointment RNCM will assist with scheduling  Goal:  Over the next 30 days, the patient will not experience hospital readmission  Interventions:  Transitions of Care: Durable Medical Equipment (DME) reviewed with patient/caregiver Doctor Visits  - discussed the importance of doctor visits Post discharge activity limitations prescribed by provider reviewed Medication review, discussed Lovenox  in detail SDOH assessment Collaborated with PCP requesting PT referral Assisted with scheduling hospital follow up appointment on 10/22/24  Patient Self Care Activities:  Attend all  scheduled provider appointments Call pharmacy for medication refills 3-7 days in advance of running out of medications Call provider office for new concerns or questions  Notify RN Care Manager of TOC call rescheduling needs Participate in Transition of Care Program/Attend TOC scheduled calls Take medications as prescribed    Plan:  Telephone follow up appointment with care management team member scheduled for:  10/21/24 at 3pm       Discussed and offered 30 day TOC program.  Patient       enrolled .  The patient has been provided with contact information for the care management team and has  been advised to call with any health -related questions or concerns.  The patient verbalized understanding with current plan of care.  The patient is directed to their insurance card regarding availability of benefits coverage.   Andrea Dimes RN, BSN [place]  Value-Based Care Institute [facility] RN Care Manager [phone]  "

## 2024-10-15 ENCOUNTER — Other Ambulatory Visit (HOSPITAL_COMMUNITY): Payer: Self-pay

## 2024-10-18 ENCOUNTER — Other Ambulatory Visit (HOSPITAL_COMMUNITY): Payer: Self-pay

## 2024-10-18 ENCOUNTER — Other Ambulatory Visit: Payer: Self-pay

## 2024-10-20 ENCOUNTER — Other Ambulatory Visit: Payer: Self-pay | Admitting: Pharmacy Technician

## 2024-10-20 ENCOUNTER — Other Ambulatory Visit: Payer: Self-pay | Admitting: Pharmacist

## 2024-10-20 ENCOUNTER — Telehealth: Payer: Self-pay | Admitting: Nurse Practitioner

## 2024-10-20 ENCOUNTER — Other Ambulatory Visit: Payer: Self-pay

## 2024-10-20 NOTE — Progress Notes (Signed)
 Specialty Pharmacy Refill Coordination Note  Jonathan Dickson is a 69 y.o. male contacted today regarding refills of specialty medication(s) Tafamidis  (Vyndamax )   Patient requested Delivery   Delivery date: 10/22/24   Verified address: [address]  [place]    Medication will be filled on: 10/21/24

## 2024-10-20 NOTE — Progress Notes (Signed)
 Specialty Pharmacy Ongoing Clinical Assessment Note  Jonathan Dickson is a 69 y.o. male who is being followed by the specialty pharmacy service for RxSp Cardiology   Patient's specialty medication(s) reviewed today: Tafamidis  (Vyndamax )   Missed doses in the last 4 weeks: 0   Patient/Caregiver did not have any additional questions or concerns.   Therapeutic benefit summary: Patient is achieving benefit   Adverse events/side effects summary: No adverse events/side effects   Patient's therapy is appropriate to: Continue    Goals Addressed             This Visit's Progress    Stabilization of disease   On track    Patient is on track. Patient will maintain adherence         Follow up: 12 months  [name] Specialty Pharmacist

## 2024-10-20 NOTE — Progress Notes (Signed)
 Clinical Intervention Note  Clinical Intervention Notes: Patient recently added Oxycodone , Enoxaparin , methocarbomol, sodium bicarbonate  and gabapentin . No DDI's with Vyndamax    Clinical Intervention Outcomes: Prevention of an adverse drug event   [name] Specialty Pharmacist

## 2024-10-20 NOTE — Telephone Encounter (Signed)
 Contacted pt confirmed appt (per vr)

## 2024-10-21 ENCOUNTER — Inpatient Hospital Stay: Attending: Hematology | Admitting: Hematology

## 2024-10-21 ENCOUNTER — Other Ambulatory Visit: Payer: Self-pay | Admitting: Nurse Practitioner

## 2024-10-21 ENCOUNTER — Other Ambulatory Visit: Payer: Self-pay

## 2024-10-21 ENCOUNTER — Inpatient Hospital Stay

## 2024-10-21 ENCOUNTER — Other Ambulatory Visit: Payer: Self-pay | Admitting: *Deleted

## 2024-10-21 VITALS — BP 136/91 | HR 80 | Temp 97.9°F | Resp 20 | Wt 160.2 lb

## 2024-10-21 DIAGNOSIS — I82503 Chronic embolism and thrombosis of unspecified deep veins of lower extremity, bilateral: Secondary | ICD-10-CM | POA: Diagnosis not present

## 2024-10-21 DIAGNOSIS — E538 Deficiency of other specified B group vitamins: Secondary | ICD-10-CM

## 2024-10-21 DIAGNOSIS — E119 Type 2 diabetes mellitus without complications: Secondary | ICD-10-CM

## 2024-10-21 DIAGNOSIS — K449 Diaphragmatic hernia without obstruction or gangrene: Secondary | ICD-10-CM | POA: Diagnosis not present

## 2024-10-21 DIAGNOSIS — Z7901 Long term (current) use of anticoagulants: Secondary | ICD-10-CM | POA: Diagnosis not present

## 2024-10-21 DIAGNOSIS — I2699 Other pulmonary embolism without acute cor pulmonale: Secondary | ICD-10-CM | POA: Diagnosis present

## 2024-10-21 DIAGNOSIS — K5730 Diverticulosis of large intestine without perforation or abscess without bleeding: Secondary | ICD-10-CM | POA: Insufficient documentation

## 2024-10-21 DIAGNOSIS — D509 Iron deficiency anemia, unspecified: Secondary | ICD-10-CM | POA: Insufficient documentation

## 2024-10-21 DIAGNOSIS — I824Y9 Acute embolism and thrombosis of unspecified deep veins of unspecified proximal lower extremity: Secondary | ICD-10-CM

## 2024-10-21 DIAGNOSIS — D500 Iron deficiency anemia secondary to blood loss (chronic): Secondary | ICD-10-CM

## 2024-10-21 LAB — CBC WITH DIFFERENTIAL (CANCER CENTER ONLY)
Abs Immature Granulocytes: 0.09 K/uL — ABNORMAL HIGH (ref 0.00–0.07)
Basophils Absolute: 0.1 K/uL (ref 0.0–0.1)
Basophils Relative: 1 %
Eosinophils Absolute: 0.2 K/uL (ref 0.0–0.5)
Eosinophils Relative: 2 %
HCT: 42.4 % (ref 39.0–52.0)
Hemoglobin: 14.1 g/dL (ref 13.0–17.0)
Immature Granulocytes: 1 %
Lymphocytes Relative: 27 %
Lymphs Abs: 1.8 K/uL (ref 0.7–4.0)
MCH: 29.4 pg (ref 26.0–34.0)
MCHC: 33.3 g/dL (ref 30.0–36.0)
MCV: 88.3 fL (ref 80.0–100.0)
Monocytes Absolute: 0.6 K/uL (ref 0.1–1.0)
Monocytes Relative: 8 %
Neutro Abs: 4 K/uL (ref 1.7–7.7)
Neutrophils Relative %: 61 %
Platelet Count: 232 K/uL (ref 150–400)
RBC: 4.8 MIL/uL (ref 4.22–5.81)
RDW: 15.8 % — ABNORMAL HIGH (ref 11.5–15.5)
WBC Count: 6.7 K/uL (ref 4.0–10.5)
nRBC: 0.3 % — ABNORMAL HIGH (ref 0.0–0.2)

## 2024-10-21 LAB — CMP (CANCER CENTER ONLY)
ALT: 16 U/L (ref 0–44)
AST: 21 U/L (ref 15–41)
Albumin: 3.9 g/dL (ref 3.5–5.0)
Alkaline Phosphatase: 65 U/L (ref 38–126)
Anion gap: 10 (ref 5–15)
BUN: 11 mg/dL (ref 8–23)
CO2: 26 mmol/L (ref 22–32)
Calcium: 9.8 mg/dL (ref 8.9–10.3)
Chloride: 104 mmol/L (ref 98–111)
Creatinine: 1.05 mg/dL (ref 0.61–1.24)
GFR, Estimated: >60 mL/min
Glucose, Bld: 120 mg/dL — ABNORMAL HIGH (ref 70–99)
Potassium: 4.2 mmol/L (ref 3.5–5.1)
Sodium: 140 mmol/L (ref 135–145)
Total Bilirubin: 0.2 mg/dL (ref 0.0–1.2)
Total Protein: 6.8 g/dL (ref 6.5–8.1)

## 2024-10-21 LAB — VITAMIN B12: Vitamin B-12: 927 pg/mL — ABNORMAL HIGH (ref 180–914)

## 2024-10-21 LAB — FERRITIN: Ferritin: 179 ng/mL (ref 24–336)

## 2024-10-21 LAB — IRON AND IRON BINDING CAPACITY (CC-WL,HP ONLY)
Iron: 75 ug/dL (ref 45–182)
Saturation Ratios: 25 % (ref 17.9–39.5)
TIBC: 298 ug/dL (ref 250–450)
UIBC: 223 ug/dL

## 2024-10-21 NOTE — Transitions of Care (Post Inpatient/ED Visit) (Signed)
 " Transition of Care week 2  Visit Note  10/21/2024  Name: Jonathan Dickson MRN: [id number]          DOB: [birth year]  Situation: Patient enrolled in [facility] 30-day program. Visit completed with Mr. Disney by telephone.   Background:   Initial Transition Care Management Follow-up Telephone Call Discharge Date and Diagnosis: 10/12/24, Acute DVT   Past Medical History:  Diagnosis Date   Abnormal TSH 02/2016   AICD (automatic cardioverter/defibrillator) present    St Jude   Anxiety    Blood clotting disorder    Bronchiectasis (HCC)    left lower lung   Cardiac amyloidosis (HCC)    Cervical disc disease    Chronic combined systolic and diastolic CHF (congestive heart failure) (HCC)    a. 01/2016 Echo: EF 25%, inf AK, diffuse sev HK, Gr1 DD, mild MR.   Coronary atherosclerosis    Depression    Diabetes mellitus without complication (HCC)    type 2   Dyspnea    with exertion   Dysrhythmia    Full thickness rotator cuff tear 06/2021   MRI   History of DVT (deep vein thrombosis) 08/04/2019   venous doppler US    History of hiatal hernia    Hx of adenomatous polyp of colon    Hyperglycemia    Hyperlipidemia    Hypertension    Hypertensive heart disease    Myocardial infarction (HCC)    greater than 5 yrs per patient on 08/06/23   NICM (nonischemic cardiomyopathy) (HCC)    a. 01/2016 Echo: EF 25%, inf AK, diffuse sev HK, Gr1 DD, mild MR; b. 01/2016 MV: EF 22%, no isch/infarct;  c. 02/2016 Cath: Nl cors, EF 35-45%.   Osteoarthritis of hips, bilateral    and hands   Paraseptal emphysema (HCC)    mild   Peanut allergy    Pneumonia    x 1   Pre-diabetes    hx but now DM 2   Presence of implantable cardioverter-defibrillator (ICD)    St Jude   S/P repair of ventral hernia    Seizures (HCC)    allergic with penicillin - only one seizure   Sensorineural hearing loss 11/25/2017   no hearing aids, patient denies this dx   Substance abuse (HCC)    none in 20 yrs per patient as of 08/06/23     Assessment: Patient Reported Symptoms: Cognitive Cognitive Status: Able to follow simple commands, Alert and oriented to person, place, and time, Normal speech and language skills      Neurological Neurological Review of Symptoms: No symptoms reported    HEENT HEENT Symptoms Reported: No symptoms reported      Cardiovascular Cardiovascular Symptoms Reported: No symptoms reported    Respiratory Respiratory Symptoms Reported: No symptoms reported    Endocrine Endocrine Symptoms Reported: No symptoms reported    Gastrointestinal Gastrointestinal Symptoms Reported: No symptoms reported      Genitourinary Genitourinary Symptoms Reported: No symptoms reported    Integumentary Integumentary Symptoms Reported: Bruising Additional Integumentary Details: bruising from Lovenox  injections Skin Management Strategies: Coping strategies, Routine screening Skin Self-Management Outcome: 3 (uncertain) Skin Comment: Discussed the importance of alternating injection sites  Musculoskeletal Musculoskelatal Symptoms Reviewed: No symptoms reported Musculoskeletal Self-Management Outcome: 4 (good) Musculoskeletal Comment: Patient is walking independently without issue      Psychosocial Psychosocial Symptoms Reported: No symptoms reported         There were no vitals filed for this visit.    Medications Reviewed  Today     Reviewed by Lucky Andrea LABOR, RN (Registered Nurse) on 10/21/24 at 1512  Med List Status: <None>   Medication Order Taking? Sig Documenting Provider Last Dose Status Informant  acetaminophen  (TYLENOL ) 325 MG tablet [id number] Yes Take 2 tablets (650 mg total) by mouth every 6 (six) hours as needed for mild pain (pain score 1-3) (or Fever >/= 101). Hongalgi, Anand D, MD  Active Self, Pharmacy Records  albuterol  (VENTOLIN  HFA) 108 [phone] Base) MCG/ACT inhaler [id number] Yes INHALE 2 PUFFS INTO THE LUNGS EVERY 6 HOURS AS NEEDED FOR WHEEZING OR SHORTNESS OF BREATH Parrett, Madelin RAMAN,  NP  Active Self, Pharmacy Records  Aromatic Inhalants SANDRALEE [place]) INHA [id number] Yes Inhale 1 Dose into the lungs daily as needed (congestion). [provider]  Active Self, Pharmacy Records  atorvastatin  (LIPITOR ) 80 MG tablet [id number] Yes Take 1 tablet (80 mg total) by mouth daily. Bensimhon, Toribio SAUNDERS, MD  Active Self, Pharmacy Records  carvedilol  (COREG ) 12.5 MG tablet [id number] Yes TAKE 1 TABLET(12.5 MG) BY MOUTH TWICE DAILY WITH A MEAL. NEED FOLLOW UP APPOINTMENT FOR ANYMORE REFILLS Bensimhon, Toribio SAUNDERS, MD  Active Self, Pharmacy Records  Chlorphen-Phenyleph-ASA [facility] PLUS COLD PO) [id number] Yes Take 1-2 tablets by mouth 2 (two) times daily as needed (cold symptoms).  [provider]  Active Self, Pharmacy Records           Med Note [facility], ERIN T   Sat Oct 09, 2024  2:00 PM)    cholecalciferol  (VITAMIN D3) 25 MCG (1000 UNIT) tablet [id number] Yes Take 1,000 Units by mouth daily. [provider]  Active Self, Pharmacy Records  cyanocobalamin  (VITAMIN B12) 1000 MCG tablet [id number] Yes Take 1,000 mcg by mouth daily. [provider]  Active Self, Pharmacy Records  dapagliflozin  propanediol (FARXIGA ) 10 MG TABS tablet [id number] Yes TAKE 1 TABLET(10 MG) BY MOUTH DAILY [place]ford, Harlene HERO, FNP  Active Self, Pharmacy Records  enoxaparin  (LOVENOX ) 80 MG/0.8ML injection [id number] Yes Inject 0.65 mLs (65 mg total) into the skin every 12 (twelve) hours. Sherrill Cable [place], [place]  Active   ENTRESTO  49-51 MG [id number] Yes Take 1 tablet by mouth 2 (two) times daily. Bensimhon, Toribio SAUNDERS, MD  Active Self, Pharmacy Records  EPINEPHrine  (NEFFY ) 2 MG/0.1ML SOLN [id number]  Place 1 spray into the nose once as needed for up to 1 dose.  Patient not taking: Reported on 10/21/2024   Theotis Haze ORN, NP  Active Self, Pharmacy Records           Med Note [place], ROCKY DASEN   Dju Oct 09, 2024  2:02 PM) Pt has this injection available at home if needed; however, he has never had  to use it   fluticasone  (FLONASE ) 50 MCG/ACT nasal spray [id number]  SHAKE LIQUID AND USE 1 SPRAY IN EACH NOSTRIL DAILY  Patient not taking: Reported on 10/21/2024   Meade Verdon RAMAN, MD  Active Self, Pharmacy Records  furosemide  (LASIX ) 40 MG tablet [id number] Yes TAKE 1 TABLET BY MOUTH EVERY DAY AS NEEDED Bensimhon, Toribio SAUNDERS, MD  Active Self, Pharmacy Records  gabapentin  (NEURONTIN ) 300 MG capsule [id number] Yes Take 1 capsule (300 mg total) by mouth 3 (three) times daily. [address], Omair [place], [place]  Active   metFORMIN  (GLUCOPHAGE ) 500 MG tablet [id number] Yes TAKE 1 TABLET(500 MG) BY MOUTH DAILY WITH BREAKFAST Fleming, Zelda W, NP  Active   methocarbamol  (ROBAXIN ) 500 MG tablet [id number] Yes Take 1 tablet (500 mg total) by mouth 3 (three) times daily. Sherrill,  Omair Latif, DO  Active   Misc. Devices MISC [id number] Yes by Does not apply route. Flutter valve daily [provider]  Active Self, Pharmacy Records  spironolactone  (ALDACTONE ) 25 MG tablet [id number] Yes Take 0.5 tablets (12.5 mg total) by mouth daily. Bensimhon, Toribio SAUNDERS, MD  Active Self, Pharmacy Records  Tafamidis  (VYNDAMAX ) 61 MG CAPS [id number] Yes Take 1 capsule (61 mg total) by mouth daily. Bensimhon, Toribio SAUNDERS, MD  Active Self, Pharmacy Records            Recommendation:   Continue Current Plan of Care  Follow Up Plan:   Telephone follow-up in 1 week  Andrea Dimes RN, BSN [place]  Value-Based Care Institute [facility] RN Care Manager [phone]     "

## 2024-10-21 NOTE — Patient Instructions (Signed)
 Visit Information  Thank you for taking time to visit with me today. Please don't hesitate to contact me if I can be of assistance to you before our next scheduled telephone appointment.   Following is a copy of your care plan:   Goals Addressed             This Visit's Progress    VBCI Transitions of Care (TOC) Care Plan       Problems:  Recent Hospitalization for treatment of DVT No Hospital Follow Up Provider appointment RNCM will assist with scheduling  Goal:  Over the next 30 days, the patient will not experience hospital readmission  Interventions:  Transitions of Care: Doctor Visits  - discussed the importance of doctor visits Post discharge activity limitations prescribed by provider reviewed Medication review, discussed alternating Lovenox  injection sites Reviewed PCP hospital follow up on 10/22/24 Discussed Hematology visit, provider note was not available to review Discussed the importance of preparing for upcoming Inclement weather  Patient Self Care Activities:  Attend all scheduled provider appointments Call pharmacy for medication refills 3-7 days in advance of running out of medications Call provider office for new concerns or questions  Notify RN Care Manager of [facility] call rescheduling needs Participate in Transition of Care Program/Attend TOC scheduled calls Take medications as prescribed    Plan:  Telephone follow up appointment with care management team member scheduled for:  10/27/24 at 11am        Patient verbalizes understanding of instructions and care plan provided today and agrees to view in MyChart. Active MyChart status and patient understanding of how to access instructions and care plan via MyChart confirmed with patient.     Telephone follow up appointment with care management team member scheduled for:10/27/24 at 11am  Please call the care guide team at [phone] if you need to cancel or reschedule your appointment.   Please call  1-800-273-TALK (toll free, 24 hour hotline) go to [facility] [address], [place] [phone]) call 911 if you are experiencing a Mental Health or Behavioral Health Crisis or need someone to talk to.  Andrea Dimes RN, BSN   Value-Based Care Institute [facility] RN Care Manager [phone]

## 2024-10-21 NOTE — Progress Notes (Signed)
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            +-----------+--------+-----+--------+---------+-----------+ PTA Mid    61                   triphasic            +-----------+--------+-----+--------+---------+-----------+ PTA Distal                               collaterals +-----------+--------+-----+--------+---------+-----------+ PERO Distal                              collaterals +-----------+--------+-----+--------+---------+-----------+  +-----------+--------+-----+--------+--------------------+-----------------+ LEFT       PSV cm/sRatioStenosisWaveform            Comments          +-----------+--------+-----+--------+--------------------+-----------------+ EIA Distal 98                   multiphasic                           +-----------+--------+-----+--------+--------------------+-----------------+ CFA Prox   45                   multiphasic                           +-----------+--------+-----+--------+--------------------+-----------------+ CFA Distal 58                   multiphasic                            +-----------+--------+-----+--------+--------------------+-----------------+ DFA        56  multiphasic                           +-----------+--------+-----+--------+--------------------+-----------------+ SFA Prox   18                   biphasic to dampened                  +-----------+--------+-----+--------+--------------------+-----------------+ SFA Mid    21                   monophasic                            +-----------+--------+-----+--------+--------------------+-----------------+ SFA Distal 12                   monophasic                            +-----------+--------+-----+--------+--------------------+-----------------+ POP Prox   16                   monophasic                            +-----------+--------+-----+--------+--------------------+-----------------+ POP Distal 10                   monophasic                            +-----------+--------+-----+--------+--------------------+-----------------+ TP Trunk   19                   monophasic                            +-----------+--------+-----+--------+--------------------+-----------------+ ATA Prox   18                   monophasic          collaterals noted +-----------+--------+-----+--------+--------------------+-----------------+ ATA Mid    11                   monophasic          collaterals noted +-----------+--------+-----+--------+--------------------+-----------------+ ATA Distal 13                   monophasic                            +-----------+--------+-----+--------+--------------------+-----------------+ PTA Prox   19                   monophasic                            +-----------+--------+-----+--------+--------------------+-----------------+ PTA Mid    30                   monophasic                            +-----------+--------+-----+--------+--------------------+-----------------+ PTA Distal 49                    monophasic                            +-----------+--------+-----+--------+--------------------+-----------------+  PERO Prox  8                    monophasic                            +-----------+--------+-----+--------+--------------------+-----------------+ PERO Mid   13                   monophasic                            +-----------+--------+-----+--------+--------------------+-----------------+ PERO Distal14                   monophasic                            +-----------+--------+-----+--------+--------------------+-----------------+  Summary: Right: Triphasic waveforms noted in the common femoral, femoral, and popliteal arteries. Left: Multiphasic waveforms noted in the distal external iliac, common femoral, and profunda femoral arteries. Waveforms are biphasic in the proximal superficial artery, but are noted to change to dampened biphasic/monophasic in the proximal-mid segments. There is significant plaque noted in the distal superficial femoral and proximal popliteal arteries. Monophasic flow noted throughout the posterior tibial, anterior tibial, and peroneal arteries. Collaterals noted throughout the calf.  See table(s) above for measurements and observations.  Electronically signed by Fonda Rim on 10/09/2024 at 1:17:55 PM.    Final    VAS US  LOWER EXTREMITY VENOUS (DVT) (7a-5p) Result Date: 10/09/2024  Lower Venous DVT Study Patient Name:  Jonathan Dickson  Date of Exam:   10/09/2024 Medical Rec #: [id number]      Accession #:    [id number] Date of Birth: 16-Oct-1955      Patient Gender: M Patient Age:   25 years Exam Location:  [facility] Procedure:      VAS US  LOWER EXTREMITY VENOUS (DVT) Referring Phys: [facility] PFEIFFER --------------------------------------------------------------------------------  Indications: Thigh and hip pain X 3 days.  Risk Factors: Remote history of DVT and PE. Anticoagulation: Coumadin . Comparison Study: Prior negative left  LEV done 08/04/19. Prior negative bilateral                   LEV done 05/12/18 Performing Technologist: Alberta Lis RVS  Examination Guidelines: A complete evaluation includes B-mode imaging, spectral Doppler, color Doppler, and power Doppler as needed of all accessible portions of each vessel. Bilateral testing is considered an integral part of a complete examination. Limited examinations for reoccurring indications may be performed as noted. The reflux portion of the exam is performed with the patient in reverse Trendelenburg.  +---------+---------------+---------+-----------+----------+------------------+ RIGHT    CompressibilityPhasicitySpontaneityPropertiesThrombus Aging     +---------+---------------+---------+-----------+----------+------------------+ CFV      Full           Yes      Yes                  Rouleaux flow      +---------+---------------+---------+-----------+----------+------------------+ SFJ      Full                                                            +---------+---------------+---------+-----------+----------+------------------+ FV Prox  Partial  Age Indeterminate  +---------+---------------+---------+-----------+----------+------------------+ FV DistalPartial                                      ? acute on chronic +---------+---------------+---------+-----------+----------+------------------+ PFV      Partial        Yes      No                   Acute              +---------+---------------+---------+-----------+----------+------------------+ POP      Partial        No       No                   Acute              +---------+---------------+---------+-----------+----------+------------------+ PTV      Partial                                      Age Indeterminate  +---------+---------------+---------+-----------+----------+------------------+ PERO     Partial                                       Age Indeterminate  +---------+---------------+---------+-----------+----------+------------------+   +---------+---------------+---------+-----------+----------+-------------------+ LEFT     CompressibilityPhasicitySpontaneityPropertiesThrombus Aging      +---------+---------------+---------+-----------+----------+-------------------+ CFV      Full           Yes      Yes                                      +---------+---------------+---------+-----------+----------+-------------------+ SFJ      Full                                                             +---------+---------------+---------+-----------+----------+-------------------+ FV Prox  Partial        Yes      No                   Acute               +---------+---------------+---------+-----------+----------+-------------------+ FV Mid   Partial                                      Acute               +---------+---------------+---------+-----------+----------+-------------------+ FV DistalPartial        No       No                   Acute               +---------+---------------+---------+-----------+----------+-------------------+ PFV      Partial        Yes      No                   Acute               +---------+---------------+---------+-----------+----------+-------------------+  POP      Partial        Yes      No                   Acute on chronic    +---------+---------------+---------+-----------+----------+-------------------+ PTV      Partial        No       No                   Acute               +---------+---------------+---------+-----------+----------+-------------------+ PERO     Partial        No       No                   Acute               +---------+---------------+---------+-----------+----------+-------------------+ Gastroc  Full                                                              +---------+---------------+---------+-----------+----------+-------------------+ EIV                     Yes      No                   patent by color and                                                       Doppler             +---------+---------------+---------+-----------+----------+-------------------+     Summary: RIGHT: - Findings consistent with acute deep vein thrombosis involving the right proximal profunda vein, and right popliteal vein.  - Findings consistent with age indeterminate deep vein thrombosis involving the right femoral vein, right posterior tibial veins, and right peroneal veins. Possible acute on chronic DVT noted distal femoral vein.  - No cystic structure found in the popliteal fossa.  LEFT: - Findings consistent with acute deep vein thrombosis involving the left femoral vein, left proximal profunda vein, left popliteal vein, left posterior tibial veins, and left peroneal veins.  - No cystic structure found in the popliteal fossa.  *See table(s) above for measurements and observations. Electronically signed by Fonda Rim on 10/09/2024 at 1:15:33 PM.    Final    DG Femur Min 2 Views Left Result Date: 10/08/2024 EXAM: 2 VIEW(S) XRAY OF THE LEFT FEMUR 10/08/2024 10:32:00 PM COMPARISON: None available. 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 No malalignment. SOFT TISSUES: Vascular calcifications. IMPRESSION: 1. No acute fracture. 2. 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 Electronically signed by: Greig Pique MD MD 10/08/2024 10:39 PM EST RP Workstation: [id number]   DG Hip Unilat W or Wo Pelvis 2-3 Views Left Result Date: 10/08/2024 EXAM: 2 OR MORE VIEW(S) XRAY OF THE UNILATERAL HIP 10/08/2024 10:32:00 PM COMPARISON: None available. CLINICAL HISTORY: Pain FINDINGS: BONES AND JOINTS: No acute fracture. No malalignment. Subtle sclerosis  and lucency along the left  femoral head, possibly representing AVN. Mild joint space loss of the left hip. Mild degenerative changes of the sacroiliac joints. SOFT TISSUES: Vascular calcifications noted. IMPRESSION: 1. 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 2. Mild left hip osteoarthrosis. Electronically signed by: Greig Pique MD MD 10/08/2024 10:38 PM EST RP Workstation: [id number]   CUP PACEART REMOTE DEVICE CHECK Result Date: 09/21/2024 ICD Scheduled remote reviewed. Normal device function.  Presenting rhythm: VS.  HF diagnostics have been abnormal in this monitoring period. Next remote transmission per protocol. - CS, CVRS   ASSESSMENT & PLAN:  69 y.o. male with  1. Pulmonary Embolism  No clear provoking factors CHF with EF of 74% would certainly be a risk factor.   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  2. Microcytic anemia   3. B12 deficiency B12 -191  improved today at 927 - recommend she take B12 SL 1000 mcg daily   PLAN: - Discussed lab results on 10/21/2024 in detail with patient: -CT showed no evidence of metastatic disease in the abdomen or pelvis -sigmoid diverticulosis and hiatal hernia -recommended using compression socks on a regular basis to prevent future blood clots -PSA level was  stable. -UTD on colonoscopy -discussed reducing Lovenox  to once a day in about a month, long term usage -discussed sending genetic testing to rule out a genetic clotting disorder  -will have extra blood tests for hypercoagulability workup  done in 1-2 weeks Labs in 2 weeks Phone visit with Dr Dickson in 4 weeks  .No orders of the defined types were placed in this encounter.   FOLLOW-UP in 4 weeks for labs and follow-up with Dr. Onesimo.  The total time spent in the appointment was 32 minutes* .  All of the patient's questions were answered and the patient knows to call the clinic with any  problems, questions, or concerns.  Jonathan Onesimo MD MS AAHIVMS [facility] Hematology/Oncology Physician [facility]  *Total Encounter Time as defined by the Centers for Medicare and Medicaid Services includes, in addition to the face-to-face time of a patient visit (documented in the note above) non-face-to-face time: obtaining and reviewing outside history, ordering and reviewing medications, tests or procedures, care coordination (communications with other health care professionals or caregivers) and documentation in the medical record.  I, Alan Blowers, acting as a neurosurgeon for Jonathan Onesimo, MD.,have documented all relevant documentation on the behalf of Jonathan Onesimo, MD,as directed by  Jonathan Onesimo, MD while in the presence of Jonathan Onesimo, MD.  I have reviewed the above documentation for accuracy and completeness, and I agree with the above.  Jonathan Onesimo, MD     [1]  Social History Tobacco Use   Smoking status: Former    Current packs/day: 0.00    Average packs/day: 1.5 packs/day for 18.0 years (27.0 ttl pk-yrs)    Types: Cigarettes    Start date: 03/14/1985    Quit date: 03/15/2003    Years since quitting: 21.6   Smokeless tobacco: Never  Vaping Use   Vaping status: Never Used  Substance Use Topics   Alcohol use: No    Comment: former, none since 2004   Drug use: Not Currently    Types: Heroin, Marijuana, Crack cocaine    Comment: former, none since 2004.   "

## 2024-10-22 ENCOUNTER — Encounter: Payer: Self-pay | Admitting: *Deleted

## 2024-10-22 ENCOUNTER — Ambulatory Visit: Payer: Self-pay | Attending: *Deleted | Admitting: *Deleted

## 2024-10-22 VITALS — BP 137/73 | HR 79 | Temp 98.4°F | Ht 66.0 in | Wt 159.0 lb

## 2024-10-22 DIAGNOSIS — T782XXD Anaphylactic shock, unspecified, subsequent encounter: Secondary | ICD-10-CM

## 2024-10-22 DIAGNOSIS — T7840XD Allergy, unspecified, subsequent encounter: Secondary | ICD-10-CM

## 2024-10-22 DIAGNOSIS — I82493 Acute embolism and thrombosis of other specified deep vein of lower extremity, bilateral: Secondary | ICD-10-CM

## 2024-10-22 MED ORDER — NEFFY 2 MG/0.1ML NA SOLN: 2.0000 | Freq: Once | NASAL | 1 refills | Status: AC

## 2024-10-22 MED ORDER — FLUTICASONE PROPIONATE 50 MCG/ACT NA SUSP: 2.0000 | Freq: Every day | NASAL | 6 refills | Status: AC

## 2024-10-22 NOTE — Patient Instructions (Signed)
 Today we talked about your recent hospitalization for bilateral DVT due to Coumadin  failure.  You remain on Lovenox . Your discharge summary included a request for CBC, CMP, magnesium  and phosphate level. Your oncologist drew that yesterday. You do have a follow-up with your vascular surgeon as well as your orthopedic surgeon. You did ask for refills of Flonase  and Niffy. The scripts were sent to the pharmacy of your choice today.

## 2024-10-22 NOTE — Progress Notes (Signed)
 "   Patient ID: Jonathan Dickson, male    DOB: 27-Apr-1956  MRN: [id number]  CC: Hospitalization Follow-up (No question )   Subjective: Jonathan Dickson is a 69 y.o. male who presents for hospital follow-up visit for bilateral DVT due to failure of Coumadin . He is on Lovenox  at this time without any complaints of bleeding. The discharge summary include directions to get a CBC, CMP, magnesium  and Phos. He reports that the oncologist got that yesterday. He also has a follow-up  with the vascular surgeon and orthopedic surgeon.  He did request a refill of Flonase  for his allergic rhinitis without any complaints at this time. He  likes to keep the Flonase  on hand.  He also requested a Neffy  for history of anaphylaxis.    His concerns today include: Acute DVT, cardiac amyloidosis, HFrEF, hypertension, diabetes type 2, cervical spinal stenosis with radiculopathy,     Patient Active Problem List   Diagnosis Date Noted   Acute DVT (deep venous thrombosis) (HCC) 10/09/2024   Presence of heart assist device (HCC) 09/07/2024   Pneumonia 08/18/2023   Mucosal abnormality of stomach 07/28/2023   Gastric erythema 07/28/2023   History of adenomatous polyp of colon 07/28/2023   Benign neoplasm of colon 07/28/2023   IDA (iron  deficiency anemia) 06/23/2023   Hx of pulmonary embolus 07/26/2022   Upper airway cough syndrome 01/07/2022   Chronic rhinitis 01/07/2022   Influenza vaccine needed 10/30/2020   COVID-19 vaccine series completed 10/30/2020   Dyspnea on exertion 10/30/2020   ICD (implantable cardioverter-defibrillator) in place 05/16/2020   Blood clotting disorder 03/08/2020   Long term (current) use of anticoagulants 08/06/2019   DVT (deep venous thrombosis) (HCC) 08/04/2019   Hypertensive retinopathy 06/30/2019   Pain due to onychomycosis of toenails of both feet 05/28/2019   Hyperlipidemia associated with type 2 diabetes mellitus (HCC) 05/15/2019   Type 2 diabetes mellitus without  complication, without long-term current use of insulin  (HCC) 05/13/2019   Cardiac amyloidosis (HCC) 07/21/2018   Chronic systolic heart failure (HCC) 06/17/2018   Heart failure (HCC) 05/11/2018   Claudication in peripheral vascular disease 12/31/2017   Sensorineural hearing loss (SNHL) of both ears 11/25/2017   Decreased hearing of both ears 11/10/2017   Cervical disc disease with myelopathy 07/10/2016   NICM (nonischemic cardiomyopathy) (HCC) 03/12/2016   Congestive dilated cardiomyopathy (HCC) 03/11/2016   Cervical radiculitis 04/19/2015   Essential hypertension 09/27/2014     Medications Ordered Prior to Encounter[1]  Allergies[2]  Social History   Socioeconomic History   Marital status: Widowed    Spouse name: Not on file   Number of children: 3   Years of education: Not on file   Highest education level: GED or equivalent  Occupational History   Not on file  Tobacco Use   Smoking status: Former    Current packs/day: 0.00    Average packs/day: 1.5 packs/day for 18.0 years (27.0 ttl pk-yrs)    Types: Cigarettes    Start date: 03/14/1985    Quit date: 03/15/2003    Years since quitting: 21.6   Smokeless tobacco: Never  Vaping Use   Vaping status: Never Used  Substance and Sexual Activity   Alcohol use: No    Comment: former, none since 2004   Drug use: Not Currently    Types: Heroin, Marijuana, Crack cocaine    Comment: former, none since 2004.   Sexual activity: Not Currently  Other Topics Concern   Not on file  Social History Narrative  Not on file   Social Drivers of Health   Tobacco Use: Medium Risk (10/09/2024)   Patient History    Smoking Tobacco Use: Former    Smokeless Tobacco Use: Never    Passive Exposure: Not on file  Financial Resource Strain: Low Risk (09/06/2024)   Overall Financial Resource Strain (CARDIA)    Difficulty of Paying Living Expenses: Not hard at all  Food Insecurity: No Food Insecurity (10/14/2024)   Epic    Worried About  Programme Researcher, Broadcasting/film/video in the Last Year: Never true    Ran Out of Food in the Last Year: Never true  Transportation Needs: No Transportation Needs (10/14/2024)   Epic    Lack of Transportation (Medical): No    Lack of Transportation (Non-Medical): No  Physical Activity: Insufficiently Active (05/06/2023)   Exercise Vital Sign    Days of Exercise per Week: 3 days    Minutes of Exercise per Session: 30 min  Stress: No Stress Concern Present (05/06/2023)   [employer] of Occupational Health - Occupational Stress Questionnaire    Feeling of Stress : Not at all  Social Connections: Moderately Integrated (10/09/2024)   Social Connection and Isolation Panel    Frequency of Communication with Friends and Family: More than three times a week    Frequency of Social Gatherings with Friends and Family: Three times a week    Attends Religious Services: More than 4 times per year    Active Member of Clubs or Organizations: Yes    Attends Banker Meetings: More than 4 times per year    Marital Status: Widowed  Recent Concern: Social Connections - Moderately Isolated (09/06/2024)   Social Connection and Isolation Panel    Frequency of Communication with Friends and Family: More than three times a week    Frequency of Social Gatherings with Friends and Family: More than three times a week    Attends Religious Services: 1 to 4 times per year    Active Member of [employer] or Organizations: No    Attends Banker Meetings: Not on file    Marital Status: Widowed  Intimate Partner Violence: Not At Risk (10/14/2024)   Epic    Fear of Current or Ex-Partner: No    Emotionally Abused: No    Physically Abused: No    Sexually Abused: No  Depression (PHQ2-9): Low Risk (10/14/2024)   Depression (PHQ2-9)    PHQ-2 Score: 0  Alcohol Screen: Low Risk (05/06/2023)   Alcohol Screen    Last Alcohol Screening Score (AUDIT): 0  Housing: Unknown (10/14/2024)   Epic    Unable to Pay for Housing in the  Last Year: No    Number of Times Moved in the Last Year: Not on file    Homeless in the Last Year: No  Utilities: Not At Risk (10/14/2024)   Epic    Threatened with loss of utilities: No  Health Literacy: Adequate Health Literacy (05/06/2023)   B1300 Health Literacy    Frequency of need for help with medical instructions: Rarely    Family History  Problem Relation Age of Onset   Diabetes Mother    Diabetes Sister    Clotting disorder Neg Hx    Lung disease Neg Hx    Colon cancer Neg Hx    Esophageal cancer Neg Hx    Stomach cancer Neg Hx    Pancreatic cancer Neg Hx    Colon polyps Neg Hx    Rectal cancer Neg  Hx     Past Surgical History:  Procedure Laterality Date   ANTERIOR CERVICAL DECOMP/DISCECTOMY FUSION N/A 07/10/2016   Procedure: ANTERIOR CERVICAL DECOMPRESSION FUSION CERVICAL 4-5, CERVICAL 5-6, CERVICAL 6-7 WITH INSTRUMENTATION AND ALLOGRAFT;  Surgeon: Oneil Priestly, MD;  Location: [facility];  Service: Orthopedics;  Laterality: N/A;   ANTERIOR CERVICAL DECOMP/DISCECTOMY FUSION N/A 04/19/2021   Procedure: ANTERIOR CERVICAL DECOMPRESSION FUSION CERVICAL 3- CERVICAL 4 WITH INSTRUMENTATION AND ALLOGRAFT;  Surgeon: Priestly Oneil, MD;  Location: [facility];  Service: Orthopedics;  Laterality: N/A;   ANTERIOR CERVICAL DECOMP/DISCECTOMY FUSION N/A 08/14/2023   Procedure: ANTERIOR CERVICAL DECOMPRESSION FUSION CERVICAL 7 - THORACIC 1 WITH INSTRUMENTATION AND ALLOGRAFT;  Surgeon: Priestly Oneil, MD;  Location: [facility];  Service: Orthopedics;  Laterality: N/A;   BIOPSY  07/28/2023   Procedure: BIOPSY;  Surgeon: Leigh Elspeth SQUIBB, MD;  Location: [facility];  Service: Gastroenterology;;   CARDIAC CATHETERIZATION N/A 03/11/2016   Procedure: Left Heart Cath and Coronary Angiography;  Surgeon: Alm LELON Clay, MD;  Location: [facility] INVASIVE CV LAB;  Service: Cardiovascular;  Laterality: N/A;   COLONOSCOPY     COLONOSCOPY WITH PROPOFOL  N/A 07/28/2023   Procedure: COLONOSCOPY WITH PROPOFOL ;  Surgeon:  Leigh Elspeth SQUIBB, MD;  Location: [facility];  Service: Gastroenterology;  Laterality: N/A;   ESOPHAGOGASTRODUODENOSCOPY (EGD) WITH PROPOFOL  N/A 07/28/2023   Procedure: ESOPHAGOGASTRODUODENOSCOPY (EGD) WITH PROPOFOL ;  Surgeon: Leigh Elspeth SQUIBB, MD;  Location: [facility];  Service: Gastroenterology;  Laterality: N/A;   HOT HEMOSTASIS N/A 07/28/2023   Procedure: HOT HEMOSTASIS (ARGON PLASMA COAGULATION/BICAP);  Surgeon: Leigh Elspeth SQUIBB, MD;  Location: [facility];  Service: Gastroenterology;  Laterality: N/A;   ICD IMPLANT N/A 06/17/2018   Procedure: ICD IMPLANT;  Surgeon: Inocencio Soyla Lunger, MD;  Location: [facility] INV[facility]IVE CV LAB;  Service: Cardiovascular;  Laterality: N/A;   POLYPECTOMY  07/28/2023   Procedure: POLYPECTOMY;  Surgeon: Leigh Elspeth SQUIBB, MD;  Location: [facility];  Service: Gastroenterology;;   Thumb surgery Right    VENTRAL HERNIA REPAIR N/A 10/25/2019   Procedure: PRIMARY VENTRAL HERNIA REPAIR;  Surgeon: Signe Mitzie LABOR, MD;  Location: [facility];  Service: General;  Laterality: N/A;    ROS: Review of Systems Negative except as stated above  PHYSICAL EXAM: BP 137/73 (BP Location: Left Arm, Patient Position: Sitting, Cuff Size: Normal)   Pulse 79   Temp 98.4 F (36.9 C) (Oral)   Ht 5' 6 (1.676 m)   Wt 72.1 kg   SpO2 97%   BMI 25.66 kg/m   Physical Exam Vitals and nursing note reviewed.  Constitutional:      Appearance: Normal appearance.  Cardiovascular:     Rate and Rhythm: Normal rate and regular rhythm.  Pulmonary:     Effort: Pulmonary effort is normal.     Breath sounds: Normal breath sounds.  Abdominal:     General: Abdomen is flat.     Palpations: Abdomen is soft.  Skin:    General: Skin is warm and dry.  Neurological:     Mental Status: Mental status is at baseline.          Latest Ref Rng & Units 10/21/2024    8:59 AM 10/12/2024    5:08 AM 10/11/2024    5:22 AM  CMP  Glucose 70 - 99 mg/dL 879  865  894   BUN 8 - 23 mg/dL  11  21  20    Creatinine 0.61 - 1.24 mg/dL 8.94  9.11  9.12   Sodium 135 - 145 mmol/L 140  137  135   Potassium 3.5 - 5.1 mmol/L 4.2  4.1  3.3   Chloride 98 - 111 mmol/L 104  103  103   CO2 22 - 32 mmol/L 26  17  20    Calcium  8.9 - 10.3 mg/dL 9.8  9.4  9.0   Total Protein 6.5 - 8.1 g/dL 6.8  7.0  6.5   Total Bilirubin 0.0 - 1.2 mg/dL 0.2  0.4  0.6   Alkaline Phos 38 - 126 U/L 65  66  62   AST 15 - 41 U/L 21  21  18    ALT 0 - 44 U/L 16  13  11     Lipid Panel     Component Value Date/Time   CHOL 152 10/07/2022 1049   TRIG 175 (H) 10/07/2022 1049   HDL 44 10/07/2022 1049   CHOLHDL 3.5 10/07/2022 1049   CHOLHDL 2.9 10/04/2014 1508   VLDL 17 10/04/2014 1508   LDLCALC 78 10/07/2022 1049    CBC    Component Value Date/Time   WBC 6.7 10/21/2024 0859   WBC 8.4 10/12/2024 0508   RBC 4.80 10/21/2024 0859   HGB 14.1 10/21/2024 0859   HGB 15.1 09/07/2024 1524   HCT 42.4 10/21/2024 0859   HCT 47.5 09/07/2024 1524   PLT 232 10/21/2024 0859   PLT 186 09/07/2024 1524   MCV 88.3 10/21/2024 0859   MCV 89 09/07/2024 1524   MCH 29.4 10/21/2024 0859   MCHC 33.3 10/21/2024 0859   RDW 15.8 (H) 10/21/2024 0859   RDW 14.5 09/07/2024 1524   LYMPHSABS 1.8 10/21/2024 0859   LYMPHSABS 2.0 09/07/2024 1524   MONOABS 0.6 10/21/2024 0859   EOSABS 0.2 10/21/2024 0859   EOSABS 0.2 09/07/2024 1524   BASOSABS 0.1 10/21/2024 0859   BASOSABS 0.1 09/07/2024 1524    Results for orders placed or performed in visit on 10/21/24  Vitamin B12   Collection Time: 10/21/24  8:59 AM  Result Value Ref Range   Vitamin B-12 927 (H) 180 - 914 pg/mL  Iron  and Iron  Binding Capacity ([facility] only)   Collection Time: 10/21/24  8:59 AM  Result Value Ref Range   Iron  75 45 - 182 ug/dL   TIBC 701 749 - 549 ug/dL   Saturation Ratios 25 17.9 - 39.5 %   UIBC 223 ug/dL  CMP (Cancer Center only)   Collection Time: 10/21/24  8:59 AM  Result Value Ref Range   Sodium 140 135 - 145 mmol/L   Potassium 4.2 3.5 - 5.1  mmol/L   Chloride 104 98 - 111 mmol/L   CO2 26 22 - 32 mmol/L   Glucose, Bld 120 (H) 70 - 99 mg/dL   BUN 11 8 - 23 mg/dL   Creatinine 8.94 9.38 - 1.24 mg/dL   Calcium  9.8 8.9 - 10.3 mg/dL   Total Protein 6.8 6.5 - 8.1 g/dL   Albumin 3.9 3.5 - 5.0 g/dL   AST 21 15 - 41 U/L   ALT 16 0 - 44 U/L   Alkaline Phosphatase 65 38 - 126 U/L   Total Bilirubin 0.2 0.0 - 1.2 mg/dL   GFR, Estimated >39 >39 mL/min   Anion gap 10 5 - 15  CBC with Differential (Cancer Center Only)   Collection Time: 10/21/24  8:59 AM  Result Value Ref Range   WBC Count 6.7 4.0 - 10.5 K/uL   RBC 4.80 4.22 - 5.81 MIL/uL   Hemoglobin 14.1 13.0 - 17.0 g/dL  HCT 42.4 39.0 - 52.0 %   MCV 88.3 80.0 - 100.0 fL   MCH 29.4 26.0 - 34.0 pg   MCHC 33.3 30.0 - 36.0 g/dL   RDW 84.1 (H) 88.4 - 84.4 %   Platelet Count 232 150 - 400 K/uL   nRBC 0.3 (H) 0.0 - 0.2 %   Neutrophils Relative % 61 %   Neutro Abs 4.0 1.7 - 7.7 K/uL   Lymphocytes Relative 27 %   Lymphs Abs 1.8 0.7 - 4.0 K/uL   Monocytes Relative 8 %   Monocytes Absolute 0.6 0.1 - 1.0 K/uL   Eosinophils Relative 2 %   Eosinophils Absolute 0.2 0.0 - 0.5 K/uL   Basophils Relative 1 %   Basophils Absolute 0.1 0.0 - 0.1 K/uL   Immature Granulocytes 1 %   Abs Immature Granulocytes 0.09 (H) 0.00 - 0.07 K/uL  Ferritin   Collection Time: 10/21/24  9:00 AM  Result Value Ref Range   Ferritin 179 24 - 336 ng/mL   *Note: Due to a large number of results and/or encounters for the requested time period, some results have not been displayed. A complete set of results can be found in Results Review.     ASSESSMENT AND PLAN:  Assessment & Plan Acute deep vein thrombosis (DVT) of other specified vein of both lower extremities (HCC) As above, patient is seen in follow-up after being diagnosed with a bilateral lower extremity DVT due to failure of Coumadin . He remains on Lovenox .  (With no blood dyscrasias) He has been seen in follow-up by his oncologist who is following  him    Allergy, subsequent encounter While in the clinic he requested a refill of Flonase  that he uses as needed for his allergies No URI red flag Orders:   fluticasone  (FLONASE ) 50 MCG/ACT nasal spray; Place 2 sprays into both nostrils daily.  Anaphylaxis, subsequent encounter While in the clinic he requested a refill of Neffy . I sent a refill to his pharmacy Orders:   EPINEPHrine  (NEFFY ) 2 MG/0.1ML SOLN; Place 2 puffs into the nose once for 1 dose.    He was encouraged to keep his follow-up with his primary care provider   Patient was given the opportunity to ask questions.  Patient verbalized understanding of the plan and was able to repeat key elements of the plan.   This documentation was completed using Paediatric nurse.  Any transcriptional errors are unintentional.     Requested Prescriptions   Signed Prescriptions Disp Refills   fluticasone  (FLONASE ) 50 MCG/ACT nasal spray 16 g 6    Sig: Place 2 sprays into both nostrils daily.   EPINEPHrine  (NEFFY ) 2 MG/0.1ML SOLN 1 each 1    Sig: Place 2 puffs into the nose once for 1 dose.    No follow-ups on file.  Ido Wollman H, NP      [1]  Current Outpatient Medications on File Prior to Visit  Medication Sig Dispense Refill   acetaminophen  (TYLENOL ) 325 MG tablet Take 2 tablets (650 mg total) by mouth every 6 (six) hours as needed for mild pain (pain score 1-3) (or Fever >/= 101).     albuterol  (VENTOLIN  HFA) 108 (90 Base) MCG/ACT inhaler INHALE 2 PUFFS INTO THE LUNGS EVERY 6 HOURS AS NEEDED FOR WHEEZING OR SHORTNESS OF BREATH 6.7 g 0   Aromatic Inhalants (VICKS VAPOINHALER) INHA Inhale 1 Dose into the lungs daily as needed (congestion).     atorvastatin  (LIPITOR ) 80 MG tablet Take 1 tablet (80  mg total) by mouth daily. 30 tablet 11   carvedilol  (COREG ) 12.5 MG tablet TAKE 1 TABLET(12.5 MG) BY MOUTH TWICE DAILY WITH A MEAL. NEED FOLLOW UP APPOINTMENT FOR ANYMORE REFILLS 60 tablet 1    Chlorphen-Phenyleph-ASA (ALKA-SELTZER PLUS COLD PO) Take 1-2 tablets by mouth 2 (two) times daily as needed (cold symptoms).      cholecalciferol  (VITAMIN D3) 25 MCG (1000 UNIT) tablet Take 1,000 Units by mouth daily.     cyanocobalamin  (VITAMIN B12) 1000 MCG tablet Take 1,000 mcg by mouth daily.     dapagliflozin  propanediol (FARXIGA ) 10 MG TABS tablet TAKE 1 TABLET(10 MG) BY MOUTH DAILY 90 tablet 3   enoxaparin  (LOVENOX ) 80 MG/0.8ML injection Inject 0.65 mLs (65 mg total) into the skin every 12 (twelve) hours. 48 mL 0   ENTRESTO  49-51 MG Take 1 tablet by mouth 2 (two) times daily. 180 tablet 3   furosemide  (LASIX ) 40 MG tablet TAKE 1 TABLET BY MOUTH EVERY DAY AS NEEDED 60 tablet 1   gabapentin  (NEURONTIN ) 300 MG capsule Take 1 capsule (300 mg total) by mouth 3 (three) times daily. 30 capsule 0   metFORMIN  (GLUCOPHAGE ) 500 MG tablet TAKE 1 TABLET(500 MG) BY MOUTH DAILY WITH BREAKFAST 90 tablet 1   methocarbamol  (ROBAXIN ) 500 MG tablet Take 1 tablet (500 mg total) by mouth 3 (three) times daily. 30 tablet 0   Misc. Devices MISC by Does not apply route. Flutter valve daily     spironolactone  (ALDACTONE ) 25 MG tablet Take 0.5 tablets (12.5 mg total) by mouth daily. 45 tablet 3   Tafamidis  (VYNDAMAX ) 61 MG CAPS Take 1 capsule (61 mg total) by mouth daily. 90 capsule 3   No current facility-administered medications on file prior to visit.  [2]  Allergies Allergen Reactions   Peanut-Containing Drug Products Swelling   Penicillins Other (See Comments)    CONVULSIONS    Decadron  [Dexamethasone ] Itching   Ivp Dye [Iodinated Contrast Media]    "

## 2024-10-22 NOTE — Assessment & Plan Note (Signed)
 As above, patient is seen in follow-up after being diagnosed with a bilateral lower extremity DVT due to failure of Coumadin . He remains on Lovenox .  (With no blood dyscrasias) He has been seen in follow-up by his oncologist who is following him

## 2024-10-26 ENCOUNTER — Ambulatory Visit

## 2024-10-27 MED ORDER — ENOXAPARIN SODIUM 100 MG/ML IJ SOSY: 100.0000 mg | PREFILLED_SYRINGE | INTRAMUSCULAR | 2 refills | Status: AC

## 2024-10-28 ENCOUNTER — Telehealth: Payer: Self-pay | Admitting: Hematology

## 2024-10-28 ENCOUNTER — Other Ambulatory Visit: Payer: Self-pay | Admitting: *Deleted

## 2024-10-28 NOTE — Patient Instructions (Signed)
 Visit Information  Thank you for taking time to visit with me today. Please don't hesitate to contact me if I can be of assistance to you before our next scheduled telephone appointment.   Following is a copy of your care plan:   Goals Addressed             This Visit's Progress    VBCI Transitions of Care (TOC) Care Plan       Problems:  Recent Hospitalization for treatment of DVT No Hospital Follow Up Provider appointment RNCM will assist with scheduling  Goal:  Over the next 30 days, the patient will not experience hospital readmission  Interventions:  Transitions of Care: Doctor Visits  - discussed the importance of doctor visits Post discharge activity limitations prescribed by provider reviewed Medication review, discussed upcoming change in Lovenox  dose Discussed the importance of preparing for upcoming Inclement weather Reviewed bleeding precautions  Patient Self Care Activities:  Attend all scheduled provider appointments Call pharmacy for medication refills 3-7 days in advance of running out of medications Call provider office for new concerns or questions  Notify RN Care Manager of [facility] call rescheduling needs Participate in Transition of Care Program/Attend TOC scheduled calls Take medications as prescribed    Plan:  Telephone follow up appointment with care management team member scheduled for:  11/05/24 at 12:45pm        Patient verbalizes understanding of instructions and care plan provided today and agrees to view in MyChart. Active MyChart status and patient understanding of how to access instructions and care plan via MyChart confirmed with patient.     Telephone follow up appointment with care management team member scheduled for:11/05/24 at 12:45pm  Please call the care guide team at [phone] if you need to cancel or reschedule your appointment.   Please call [phone]-TALK (toll free, 24 hour hotline) go to [facility]  Urgent [facility] [address], [place] [phone]) call 911 if you are experiencing a Mental Health or Behavioral Health Crisis or need someone to talk to.  Andrea Dimes RN, BSN [place]  [facility] RN Care Manager [phone]

## 2024-10-28 NOTE — Transitions of Care (Post Inpatient/ED Visit) (Signed)
 " Transition of Care week 3  Visit Note  10/28/2024  Name: Jonathan Dickson MRN: [id number]          DOB: May 09, 1956  Situation: Patient enrolled in [facility] 30-day program. Visit completed with Jonathan Dickson by telephone.   Background:   Initial Transition Care Management Follow-up Telephone Call Discharge Date and Diagnosis: 10/12/24, Acute DVT   Past Medical History:  Diagnosis Date   Abnormal TSH 02/2016   AICD (automatic cardioverter/defibrillator) present    St Jude   Anxiety    Blood clotting disorder    Bronchiectasis (HCC)    left lower lung   Cardiac amyloidosis (HCC)    Cervical disc disease    Chronic combined systolic and diastolic CHF (congestive heart failure) (HCC)    a. 01/2016 Echo: EF 25%, inf AK, diffuse sev HK, Gr1 DD, mild MR.   Coronary atherosclerosis    Depression    Diabetes mellitus without complication (HCC)    type 2   Dyspnea    with exertion   Dysrhythmia    Full thickness rotator cuff tear 06/2021   MRI   History of DVT (deep vein thrombosis) 08/04/2019   venous doppler US    History of hiatal hernia    Hx of adenomatous polyp of colon    Hyperglycemia    Hyperlipidemia    Hypertension    Hypertensive heart disease    Myocardial infarction (HCC)    greater than 5 yrs per patient on 08/06/23   NICM (nonischemic cardiomyopathy) (HCC)    a. 01/2016 Echo: EF 25%, inf AK, diffuse sev HK, Gr1 DD, mild MR; b. 01/2016 MV: EF 22%, no isch/infarct;  c. 02/2016 Cath: Nl cors, EF 35-45%.   Osteoarthritis of hips, bilateral    and hands   Paraseptal emphysema (HCC)    mild   Peanut allergy    Pneumonia    x 1   Pre-diabetes    hx but now DM 2   Presence of implantable cardioverter-defibrillator (ICD)    St Jude   S/P repair of ventral hernia    Seizures (HCC)    allergic with penicillin - only one seizure   Sensorineural hearing loss 11/25/2017   no hearing aids, patient denies this dx   Substance abuse (HCC)    none in 20 yrs per patient as of 08/06/23     Assessment: Patient Reported Symptoms: Cognitive Cognitive Status: Able to follow simple commands, Alert and oriented to person, place, and time, Normal speech and language skills      Neurological Neurological Review of Symptoms: No symptoms reported    HEENT HEENT Symptoms Reported: No symptoms reported      Cardiovascular Cardiovascular Symptoms Reported: No symptoms reported    Respiratory Respiratory Symptoms Reported: No symptoms reported    Endocrine Endocrine Symptoms Reported: No symptoms reported    Gastrointestinal Gastrointestinal Symptoms Reported: No symptoms reported      Genitourinary Genitourinary Symptoms Reported: No symptoms reported    Integumentary Integumentary Symptoms Reported: No symptoms reported    Musculoskeletal Musculoskelatal Symptoms Reviewed: No symptoms reported        Psychosocial Psychosocial Symptoms Reported: No symptoms reported         There were no vitals filed for this visit. Pain Score: 0-No pain  Medications Reviewed Today     Reviewed by Lucky Andrea LABOR, RN (Registered Nurse) on 10/28/24 at 1112  Med List Status: <None>   Medication Order Taking? Sig Documenting Provider Last Dose Status  Informant  acetaminophen  (TYLENOL ) 325 MG tablet [id number] Yes Take 2 tablets (650 mg total) by mouth every 6 (six) hours as needed for mild pain (pain score 1-3) (or Fever >/= 101). Hongalgi, Anand D, MD  Active Self, Pharmacy Records  albuterol  (VENTOLIN  HFA) 108 [phone] [place]) MCG/ACT inhaler [id number] Yes INHALE 2 PUFFS INTO THE LUNGS EVERY 6 HOURS AS NEEDED FOR WHEEZING OR SHORTNESS OF BREATH Parrett, Madelin RAMAN, NP  Active Self, Pharmacy Records  Aromatic Inhalants SANDRALEE [place]) INHA [id number] Yes Inhale 1 Dose into the lungs daily as needed (congestion). [provider]  Active Self, Pharmacy Records  atorvastatin  (LIPITOR ) 80 MG tablet [id number] Yes Take 1 tablet (80 mg total) by mouth daily. Bensimhon, Toribio SAUNDERS, MD  Active  Self, Pharmacy Records  carvedilol  (COREG ) 12.5 MG tablet [id number] Yes TAKE 1 TABLET(12.5 MG) BY MOUTH TWICE DAILY WITH A MEAL. NEED FOLLOW UP APPOINTMENT FOR ANYMORE REFILLS Bensimhon, Toribio SAUNDERS, MD  Active Self, Pharmacy Records  Chlorphen-Phenyleph-ASA [facility] PLUS COLD PO) [id number]  Take 1-2 tablets by mouth 2 (two) times daily as needed (cold symptoms).   Patient not taking: Reported on 10/28/2024   [provider]  Active Self, Pharmacy Records           Med Note [facility], ERIN T   Sat Oct 09, 2024  2:00 PM)    cholecalciferol  (VITAMIN D3) 25 MCG (1000 UNIT) tablet [id number] Yes Take 1,000 Units by mouth daily. [provider]  Active Self, Pharmacy Records  cyanocobalamin  (VITAMIN B12) 1000 MCG tablet [id number] Yes Take 1,000 mcg by mouth daily. [provider]  Active Self, Pharmacy Records  dapagliflozin  propanediol (FARXIGA ) 10 MG TABS tablet [id number] Yes TAKE 1 TABLET(10 MG) BY MOUTH DAILY [place], Harlene HERO, FNP  Active Self, Pharmacy Records  enoxaparin  (LOVENOX ) 100 MG/ML injection [id number] Yes Inject 1 mL (100 mg total) into the skin daily.  Patient taking differently: Inject 100 mg into the skin daily. Taking 65mg  twice daily   Kale, Gautam Kishore, MD  Active   ENTRESTO  49-51 MG [id number] Yes Take 1 tablet by mouth 2 (two) times daily. Bensimhon, Toribio SAUNDERS, MD  Active Self, Pharmacy Records  fluticasone  (FLONASE ) 50 MCG/ACT nasal spray [id number] Yes Place 2 sprays into both nostrils daily. Scarlett Ronal Caldron, NP  Active   furosemide  (LASIX ) 40 MG tablet [id number] Yes TAKE 1 TABLET BY MOUTH EVERY DAY AS NEEDED Bensimhon, Toribio SAUNDERS, MD  Active Self, Pharmacy Records  gabapentin  (NEURONTIN ) 300 MG capsule [id number]  Take 1 capsule (300 mg total) by mouth 3 (three) times daily.  Patient not taking: Reported on 10/28/2024   Sheikh, Omair Latif, DO  Active   metFORMIN  (GLUCOPHAGE ) 500 MG tablet [id number] Yes TAKE 1 TABLET(500 MG) BY MOUTH DAILY WITH  BREAKFAST Fleming, Zelda W, NP  Active   methocarbamol  (ROBAXIN ) 500 MG tablet [id number]  Take 1 tablet (500 mg total) by mouth 3 (three) times daily.  Patient not taking: Reported on 10/28/2024   Sherrill Alejandro Donovan, DO  Active   Misc. Devices MISC [id number] Yes by Does not apply route. Flutter valve daily [provider]  Active Self, Pharmacy Records  spironolactone  (ALDACTONE ) 25 MG tablet [id number] Yes Take 0.5 tablets (12.5 mg total) by mouth daily. Bensimhon, Toribio SAUNDERS, MD  Active Self, Pharmacy Records  Tafamidis  (VYNDAMAX ) 61 MG CAPS [id number] Yes Take 1 capsule (61 mg total) by mouth daily. Bensimhon, Toribio SAUNDERS, MD  Active Self, Pharmacy Records  Recommendation:   Continue Current Plan of Care  Follow Up Plan:   Telephone follow-up in 1 week  Andrea Dimes RN, BSN [place]  [facility] RN Care Manager [phone]     "

## 2024-10-29 ENCOUNTER — Encounter: Payer: Self-pay | Admitting: Podiatry

## 2024-10-29 ENCOUNTER — Ambulatory Visit (INDEPENDENT_AMBULATORY_CARE_PROVIDER_SITE_OTHER): Admitting: Podiatry

## 2024-10-29 DIAGNOSIS — M79674 Pain in right toe(s): Secondary | ICD-10-CM | POA: Diagnosis not present

## 2024-10-29 DIAGNOSIS — B351 Tinea unguium: Secondary | ICD-10-CM

## 2024-10-29 DIAGNOSIS — M79675 Pain in left toe(s): Secondary | ICD-10-CM | POA: Diagnosis not present

## 2024-10-29 NOTE — Progress Notes (Signed)
 Patient presents for evaluation and treatment of tenderness and some redness around nails feet.  Tenderness around toes with walking and wearing shoes.  Physical exam:  General appearance: Alert, pleasant, and in no acute distress.  Vascular: Pedal pulses: DP 1/4 B/L, PT 1/4 B/L. Mild edema lower legs bilaterally.  Capillary refill time immediate bilaterally  Neurologic:  Dermatologic:  Nails thickened, disfigured, discolored 1-5 BL with subungual debris.  Redness and hypertrophic nail folds along nail folds bilaterally but no signs of drainage or infection.  Musculoskeletal:     Diagnosis: 1. Painful onychomycotic nails 1 through 5 bilaterally. 2. Pain toes 1 through 5 bilaterally.  Plan: -Debrided onychomycotic nails 1 through 5 bilaterally.  Sharply debrided nails with nail clipper and reduced with a power bur.  Return 3 months RFC

## 2024-11-01 ENCOUNTER — Telehealth: Payer: Self-pay

## 2024-11-01 NOTE — Telephone Encounter (Signed)
 Message received from Andrea Dimes, RN [facility] stating that the patient would like a referral to outpatient PT.  Per Epic, when he was discharged from the hospital, he wanted to wait until after his hip surgery for outpatient PT.    He had a virtual visit with Ronal Jenkins Houseman, NP 10/22/2024  but that visit note does not address a need for a PT referral.   I called the patient to discuss his needs and  had to leave a message requesting a call back

## 2024-11-02 NOTE — Telephone Encounter (Signed)
 I spoke to the patient and he said he walked with a cane for a few days when he returned home but now he is walking without an assistive device.  He stated he is not having any difficulty walking and has been driving.  He noted that he works out at [employer] for 45 minutes 2x/week and he also uses the gym equipment at his apartment building. He said he is not having any problems with his hip and is not sure there is anything wrong with his hip and he does not need PT now.  I instructed him to please call this clinic if his situation changes and/or if he has any questions and he said he understood.   Updated provided to Zelda Fleming, NP

## 2024-11-02 NOTE — Telephone Encounter (Signed)
 Copied from CRM #[id number]. Topic: General - Other >> Nov 01, 2024  4:30 PM Wess RAMAN wrote: Reason for CRM: Patient missed call from Marvis Bradley, RN and would like a call back   Callback #: [phone]

## 2024-11-03 ENCOUNTER — Inpatient Hospital Stay

## 2024-11-03 DIAGNOSIS — I824Y9 Acute embolism and thrombosis of unspecified deep veins of unspecified proximal lower extremity: Secondary | ICD-10-CM

## 2024-11-03 LAB — ANTITHROMBIN III: AntiThromb III Func: 101 % (ref 75–120)

## 2024-11-04 LAB — LUPUS ANTICOAGULANT PANEL
DRVVT: 41.2 s (ref 0.0–47.0)
PTT Lupus Anticoagulant: 38.7 s (ref 0.0–43.5)

## 2024-11-04 LAB — PROTEIN S, TOTAL: Protein S Ag, Total: 104 % (ref 60–150)

## 2024-11-04 LAB — PROTEIN S ACTIVITY: Protein S Activity: 80 % (ref 63–140)

## 2024-11-04 LAB — PROTEIN C ACTIVITY: Protein C Activity: 145 % (ref 73–180)

## 2024-11-05 ENCOUNTER — Telehealth: Payer: Self-pay | Admitting: *Deleted

## 2024-11-05 ENCOUNTER — Encounter: Payer: Self-pay | Admitting: *Deleted

## 2024-11-05 LAB — BETA-2-GLYCOPROTEIN I ABS, IGG/M/A
Beta-2 Glyco I IgG: <9 GPI IgG units (ref 0–20)
Beta-2-Glycoprotein I IgA: <9 GPI IgA units (ref 0–25)
Beta-2-Glycoprotein I IgM: <9 GPI IgM units (ref 0–32)

## 2024-11-11 ENCOUNTER — Telehealth: Payer: Self-pay | Admitting: *Deleted

## 2024-11-16 ENCOUNTER — Inpatient Hospital Stay: Admitting: Hematology

## 2024-11-16 ENCOUNTER — Inpatient Hospital Stay

## 2024-11-17 ENCOUNTER — Inpatient Hospital Stay: Admitting: Hematology

## 2025-01-28 ENCOUNTER — Ambulatory Visit: Admitting: Podiatry

## 2025-03-14 ENCOUNTER — Ambulatory Visit: Admitting: Nurse Practitioner
